# Patient Record
Sex: Male | Born: 1935 | Race: White | Hispanic: No | Marital: Married | Smoking: Never smoker
Health system: Southern US, Community
[De-identification: ages and names within clinical notes are randomized; demographics above are authoritative.]

## PROBLEM LIST (undated history)

## (undated) DIAGNOSIS — K635 Polyp of colon: Secondary | ICD-10-CM

## (undated) DIAGNOSIS — J45909 Unspecified asthma, uncomplicated: Secondary | ICD-10-CM

## (undated) DIAGNOSIS — N189 Chronic kidney disease, unspecified: Secondary | ICD-10-CM

## (undated) DIAGNOSIS — F419 Anxiety disorder, unspecified: Secondary | ICD-10-CM

## (undated) DIAGNOSIS — K219 Gastro-esophageal reflux disease without esophagitis: Secondary | ICD-10-CM

## (undated) DIAGNOSIS — C801 Malignant (primary) neoplasm, unspecified: Secondary | ICD-10-CM

## (undated) DIAGNOSIS — G20A1 Parkinson's disease without dyskinesia, without mention of fluctuations: Secondary | ICD-10-CM

## (undated) DIAGNOSIS — M1990 Unspecified osteoarthritis, unspecified site: Secondary | ICD-10-CM

## (undated) DIAGNOSIS — G20 Parkinson's disease: Secondary | ICD-10-CM

## (undated) DIAGNOSIS — Z87442 Personal history of urinary calculi: Secondary | ICD-10-CM

## (undated) HISTORY — PX: SMALL INTESTINE SURGERY: SHX150

## (undated) HISTORY — PX: EYE SURGERY: SHX253

## (undated) HISTORY — PX: HERNIA REPAIR: SHX51

## (undated) HISTORY — DX: Malignant (primary) neoplasm, unspecified: C80.1

## (undated) HISTORY — DX: Personal history of urinary calculi: Z87.442

## (undated) HISTORY — DX: Polyp of colon: K63.5

## (undated) HISTORY — PX: MOHS SURGERY: SHX181

## (undated) HISTORY — PX: COLON SURGERY: SHX602

---

## 2016-06-14 ENCOUNTER — Encounter: Payer: Self-pay | Admitting: Physician Assistant

## 2016-06-14 ENCOUNTER — Ambulatory Visit (INDEPENDENT_AMBULATORY_CARE_PROVIDER_SITE_OTHER): Payer: Medicare Other | Admitting: Physician Assistant

## 2016-06-14 ENCOUNTER — Telehealth: Payer: Self-pay

## 2016-06-14 VITALS — BP 130/70 | HR 69 | Temp 97.9°F | Resp 16 | Wt 174.8 lb

## 2016-06-14 DIAGNOSIS — R32 Unspecified urinary incontinence: Secondary | ICD-10-CM | POA: Diagnosis not present

## 2016-06-14 DIAGNOSIS — L989 Disorder of the skin and subcutaneous tissue, unspecified: Secondary | ICD-10-CM | POA: Diagnosis not present

## 2016-06-14 DIAGNOSIS — N400 Enlarged prostate without lower urinary tract symptoms: Secondary | ICD-10-CM | POA: Diagnosis not present

## 2016-06-14 DIAGNOSIS — R12 Heartburn: Secondary | ICD-10-CM | POA: Insufficient documentation

## 2016-06-14 DIAGNOSIS — Z7189 Other specified counseling: Secondary | ICD-10-CM

## 2016-06-14 DIAGNOSIS — F32A Depression, unspecified: Secondary | ICD-10-CM

## 2016-06-14 DIAGNOSIS — F0390 Unspecified dementia without behavioral disturbance: Secondary | ICD-10-CM

## 2016-06-14 DIAGNOSIS — R600 Localized edema: Secondary | ICD-10-CM | POA: Diagnosis not present

## 2016-06-14 DIAGNOSIS — Z7689 Persons encountering health services in other specified circumstances: Secondary | ICD-10-CM

## 2016-06-14 DIAGNOSIS — M1990 Unspecified osteoarthritis, unspecified site: Secondary | ICD-10-CM | POA: Insufficient documentation

## 2016-06-14 DIAGNOSIS — F329 Major depressive disorder, single episode, unspecified: Secondary | ICD-10-CM

## 2016-06-14 DIAGNOSIS — G20 Parkinson's disease: Secondary | ICD-10-CM | POA: Diagnosis not present

## 2016-06-14 DIAGNOSIS — G20A1 Parkinson's disease without dyskinesia, without mention of fluctuations: Secondary | ICD-10-CM | POA: Insufficient documentation

## 2016-06-14 MED ORDER — FINASTERIDE 5 MG PO TABS: 5.0000 mg | ORAL_TABLET | Freq: Every day | ORAL | 3 refills | Status: DC

## 2016-06-14 MED ORDER — SERTRALINE HCL 50 MG PO TABS: 50.0000 mg | ORAL_TABLET | Freq: Every day | ORAL | 3 refills | Status: DC

## 2016-06-14 MED ORDER — FUROSEMIDE 20 MG PO TABS: 20.0000 mg | ORAL_TABLET | Freq: Every day | ORAL | 3 refills | Status: DC

## 2016-06-14 MED ORDER — CARBIDOPA-LEVODOPA 25-100 MG PO TABS: ORAL_TABLET | ORAL | 11 refills | Status: DC

## 2016-06-14 MED ORDER — DONEPEZIL HCL 10 MG PO TBDP: 10.0000 mg | ORAL_TABLET | Freq: Every day | ORAL | 3 refills | Status: DC

## 2016-06-14 MED ORDER — TAMSULOSIN HCL 0.4 MG PO CAPS: 0.4000 mg | ORAL_CAPSULE | Freq: Every day | ORAL | 3 refills | Status: DC

## 2016-06-14 MED ORDER — AMANTADINE HCL 100 MG PO CAPS: 100.0000 mg | ORAL_CAPSULE | Freq: Three times a day (TID) | ORAL | 3 refills | Status: DC

## 2016-06-14 NOTE — Telephone Encounter (Signed)
Mr. And Mrs. Lupe where here today to get established.  Mrs. Mashni forgot to get a Handicapped placard form for Mr. Solan.  She would like to pick one up so she can bring it to the [facility].  Please contact her when complete at [phone].   Thanks,   -Mickel Baas

## 2016-06-14 NOTE — Progress Notes (Signed)
Patient: Luke Durham, Male    DOB: 01/05/1933, 82 y.o.   MRN: [id number] Visit Date: 06/14/2016  Today's Provider: Mar Daring, PA-C   Chief Complaint  Patient presents with  . New Patient (Initial Visit)  . Establish Care   Subjective:    Establish Care: Luke Durham is a 82 y.o. male who is here as a new patient to establish care. He feels well. He reports he is sleeping well. They moved from [place], [place] to be closer to their daughter that lives here and works at [employer].  He is overall fairly healthy with the exception of Parkinson's. He has had Parkinson's for a long period of time and states that of recent it has been progressing. He does state that his daughter is already working on getting him established with a movement specialist neurologist, Dr. Werner Lean, [facility]. He is currently on amantadine 100mg  TID, sinemet IR 25-100mg  2 tabs QID, donepezil 10mg  at bedtime, and a newer medication Nuplazid 17 mg tab.  He also has BPH for which he takes flomax 0.4mg  and then uses proscar 5mg  for urinary incontinence. He is also on furosemide 20mg  daily for lower extremity edema. Most often occurs when he sits with his legs in a dependent position.  He also uses sertraline 50mg  daily for mood stabilization. He reports he is stable on all his medications and has no complaints today.   He does take an ASA 81mg  daily. He has a stool softener that he uses prn. He takes Vitamin D3 daily and a multi-vitamin.   He was previously seen by Dr. Ricki Miller in [place], [place].  -----------------------------------------------------------------   Review of Systems  Constitutional: Positive for activity change and fatigue.  HENT: Positive for drooling, ear discharge, postnasal drip, rhinorrhea and trouble swallowing (sometimes).   Eyes: Negative.        Dry eyes  Respiratory: Negative.   Cardiovascular: Positive for leg swelling ([place]).  Gastrointestinal:  Positive for constipation.  Endocrine: Positive for polyuria.  Genitourinary: Positive for enuresis and urgency.  Musculoskeletal: Positive for gait problem. Negative for arthralgias, joint swelling and myalgias.  Skin: Positive for color change and rash.  Allergic/Immunologic: Negative.   Neurological: Positive for speech difficulty and weakness. Negative for dizziness, light-headedness and headaches.  Hematological: Negative.   Psychiatric/Behavioral: Positive for confusion, decreased concentration and hallucinations ("Nuplazid controls it"). The patient is nervous/anxious.     Social History      He  reports that he has never smoked. He has never used smokeless tobacco. He reports that he drinks alcohol. He reports that he does not use drugs.       Social History   Social History  . Marital status: Married    Spouse name: N/A  . Number of children: N/A  . Years of education: N/A   Social History Main Topics  . Smoking status: Never Smoker  . Smokeless tobacco: Never Used  . Alcohol use Yes     Comment: occasional  . Drug use: No  . Sexual activity: Not Asked   Other Topics Concern  . None   Social History Narrative  . None    Past Medical History:  Diagnosis Date  . Cancer ([place])   . Colon polyps   . History of kidney stones      Patient Active Problem List   Diagnosis Date Noted  . Parkinson's syndrome ([place]) 06/14/2016  . Arthritis 06/14/2016  . Depression 06/14/2016  . Heartburn 06/14/2016  .  Urinary incontinence 06/14/2016    Past Surgical History:  Procedure Laterality Date  . SMALL INTESTINE SURGERY     Per patient 7 years    Family History        No family status information on file.        His family history is not on file.    No Known Allergies  Current Meds  Medication Sig  . amantadine (SYMMETREL) 100 MG capsule Take 100 mg by mouth 2 (two) times daily.  Marland Kitchen aspirin 81 MG tablet Take 81 mg by mouth daily.  . carbidopa-levodopa (SINEMET  IR) 25-100 MG tablet take 2 tablets by mouth four times a day as directed  . Cholecalciferol (VITAMIN D3 PO) Take by mouth.  . docusate sodium (STOOL SOFTENER) 100 MG capsule Take 100 mg by mouth 2 (two) times daily.  . finasteride (PROSCAR) 5 MG tablet Take 5 mg by mouth daily.  . furosemide (LASIX) 20 MG tablet Take 20 mg by mouth.  . Multiple Vitamins-Minerals (OCUVITE PO) Take by mouth.  Marland Kitchen Pimavanserin Tartrate (NUPLAZID) 17 MG TABS Take 1 tablet by mouth daily.  . sertraline (ZOLOFT) 50 MG tablet Take 50 mg by mouth daily.  . tamsulosin (FLOMAX) 0.4 MG CAPS capsule Take 0.4 mg by mouth.    Patient Care Team: Mar Daring, PA-C as PCP - General (Family Medicine)     Objective:   Vitals: BP 130/70 (BP Location: Right Arm, Patient Position: Sitting, Cuff Size: Normal)   Pulse 69   Temp 97.9 F (36.6 C) (Oral)   Resp 16   Wt 174 lb 12.8 oz (79.3 kg)    Physical Exam  Constitutional: He appears well-developed and well-nourished. No distress.  HENT:  Head: Normocephalic and atraumatic.  Neck: Normal range of motion. Neck supple. No JVD present. No tracheal deviation present. No thyromegaly present.  Cardiovascular: Normal rate, regular rhythm and normal heart sounds.  Exam reveals no gallop and no friction rub.   No murmur heard. Pulmonary/Chest: Effort normal and breath sounds normal. No respiratory distress. He has no wheezes. He has no rales.  Musculoskeletal: He exhibits edema (trace today).  Lymphadenopathy:    He has no cervical adenopathy.  Neurological: He is alert. Gait abnormal.  Shuffling gait and slow start after sitting long periods  Skin: He is not diaphoretic.  Psychiatric: He has a normal mood and affect. His behavior is normal. Judgment and thought content normal.  Vitals reviewed.  Depression Screen No flowsheet data found.  Assessment & Plan:     Routine Health Maintenance and Physical Exam  Exercise Activities and Dietary  recommendations Goals    None       There is no immunization history on file for this patient.  Health Maintenance  Topic Date Due  . TETANUS/TDAP  03/24/1954  . ZOSTAVAX  03/25/1995  . PNA vac Low Risk Adult (1 of 2 - PCV13) 03/24/2000  . INFLUENZA VACCINE  05/24/2016      Discussed health benefits of physical activity, and encouraged him to engage in regular exercise appropriate for his age and condition.   1. Establishing care with new doctor, encounter for Records have been requested from Dr. Lorin Glass. I will await records. Will most likely see him back in 3 months. He is to call if he develops acute issues, questions or concerns in the meantime.  2. Parkinson's disease ([place]) Referral made to PT for movement and weakness associated with Parkinson's. Medications refilled as below. His daughter  is working on getting him established with Dr. Werner Lean, [facility]. Advised for them to call if they need assistance with this referral. - Ambulatory referral to Physical Therapy - amantadine (SYMMETREL) 100 MG capsule; Take 1 capsule (100 mg total) by mouth 3 (three) times daily.  Dispense: 270 capsule; Refill: 3 - carbidopa-levodopa (SINEMET IR) 25-100 MG tablet; take 2 tablets by mouth four times a day as directed  Dispense: 270 tablet; Refill: 11 - donepezil (ARICEPT ODT) 10 MG disintegrating tablet; Take 1 tablet (10 mg total) by mouth at bedtime.  Dispense: 90 tablet; Refill: 3  3. Urinary incontinence, unspecified incontinence type Stable. Diagnosis pulled for medication refill. Continue current medical treatment plan. - finasteride (PROSCAR) 5 MG tablet; Take 1 tablet (5 mg total) by mouth daily.  Dispense: 90 tablet; Refill: 3  4. Bilateral edema of lower extremity Stable. Diagnosis pulled for medication refill. Continue current medical treatment plan. - furosemide (LASIX) 20 MG tablet; Take 1 tablet (20 mg total) by mouth daily.  Dispense: 90 tablet; Refill: 3  5.  Depression Stable. Diagnosis pulled for medication refill. Continue current medical treatment plan. - sertraline (ZOLOFT) 50 MG tablet; Take 1 tablet (50 mg total) by mouth daily.  Dispense: 90 tablet; Refill: 3  6. Dementia, without behavioral disturbance Stable. Diagnosis pulled for medication refill. Continue current medical treatment plan. - donepezil (ARICEPT ODT) 10 MG disintegrating tablet; Take 1 tablet (10 mg total) by mouth at bedtime.  Dispense: 90 tablet; Refill: 3  7. Skin lesion Has had a few Mohs procedures on face in [place]. Has some lesions along scalp. Will refer to [facility], Dr. Evorn Gong or Dr. Kellie Moor. - Ambulatory referral to Dermatology  8. BPH (benign prostatic hyperplasia) Stable. Diagnosis pulled for medication refill. Continue current medical treatment plan. - tamsulosin (FLOMAX) 0.4 MG CAPS capsule; Take 1 capsule (0.4 mg total) by mouth daily.  Dispense: 90 capsule; Refill: 3   --------------------------------------------------------------------    Mar Daring, PA-C  [facility]

## 2016-06-15 NOTE — Telephone Encounter (Signed)
Form completed and will be up front for pick up at their convenience.

## 2016-07-19 ENCOUNTER — Ambulatory Visit: Payer: Medicare Other | Attending: Physician Assistant | Admitting: Physical Therapy

## 2016-07-19 ENCOUNTER — Encounter: Payer: Self-pay | Admitting: Physical Therapy

## 2016-07-19 DIAGNOSIS — R2681 Unsteadiness on feet: Secondary | ICD-10-CM | POA: Diagnosis not present

## 2016-07-19 DIAGNOSIS — M6281 Muscle weakness (generalized): Secondary | ICD-10-CM | POA: Insufficient documentation

## 2016-07-19 DIAGNOSIS — R293 Abnormal posture: Secondary | ICD-10-CM | POA: Insufficient documentation

## 2016-07-19 NOTE — Therapy (Signed)
[place] MAIN [facility] SERVICES [address] [place], [place], [address] Phone: [phone]   Fax:  [phone]  Physical Therapy Evaluation  Patient Details  Name: Luke Durham MRN: [id number] Date of Birth: 1933/03/28 Referring Provider: Marlyn Corporal  Encounter Date: 07/19/2016      PT End of Session - 07/19/16 1038    Visit Number 1   Number of Visits 8   Date for PT Re-Evaluation 09/13/16   Authorization Type G code 1   Authorization Time Period 10   PT Start Time 0830   PT Stop Time 0929   PT Time Calculation (min) 59 min   Equipment Utilized During Treatment Gait belt   Activity Tolerance Patient tolerated treatment well   Behavior During Therapy [facility] for tasks assessed/performed      Past Medical History:  Diagnosis Date  . Cancer ([place]) 7 years ago   lymphoma, [place] (recent)  . Colon polyps   . History of kidney stones     Past Surgical History:  Procedure Laterality Date  . HERNIA REPAIR  20 years  . SMALL INTESTINE SURGERY     Per patient 7 years    There were no vitals filed for this visit.       Subjective Assessment - 07/19/16 0833    Subjective Pt is a 82 y.o. male who presents to PT with reports of stiffness due to his PD. His wife is present with him today and assists him with answering subjective questions. He states he was diagnosed with PD ~10 years ago.  Patient notes that he had been doing well for a while, but his PD has been getting progressively worse. He notes that his biggest complaints are his stiffness and balance deficits.  The patient denies recent falls; however, he states he's had 3 in the last 6 months that occur in his home when he is not using an AD. He uses a RW and U step walker when outside but will not use his AD in the home. He reports he had a Mohs surgery for BCC last week with a return visit the following week for a check up. His precautions for this surgery include no bending or lifting. Pt  reports no difficulty sleeping or eating.    Patient is accompained by: Family member   Pertinent History Factors affecting rehab: high co-pay, progressive disease, supportive family   Limitations Sitting;Standing;Walking   How long can you sit comfortably? 1 hour   How long can you stand comfortably? 20 min.   How long can you walk comfortably? level surface with RW, greater distance   Patient Stated Goals improve balance and flexibility   Currently in Pain? No/denies            [facility] PT Assessment - 07/19/16 0001      Assessment   Medical Diagnosis PD   Referring Provider [place]   Onset Date/Surgical Date --  20 years ago   Hand Dominance Right   Next MD Visit Does not know   Prior Therapy Has had previously for PD, reports it has helped when done in the past     Precautions   Precautions Fall;Other (comment)   Precaution Comments No bending or lifting due to Mohs surgery     Restrictions   Weight Bearing Restrictions No     Balance Screen   Has the patient fallen in the past 6 months Yes   How many times? 3   Has the patient  had a decrease in activity level because of a fear of falling?  Yes   Is the patient reluctant to leave their home because of a fear of falling?  Yes     [place] Private residence   Living Arrangements Spouse/significant other   Available Help at Discharge Family   Type of [place] Access Level entry   [place] bars - toilet;Grab bars - tub/shower;Shower seat - built in;Walker - 2 wheels   Additional Comments Home is handicap accessible, has 2nd story but wife states it is not completed/used     Prior Function   Level of Independence Needs assistance with homemaking;Needs assistance with ADLs   Vocation Retired   Biomedical scientist None   Leisure Marine scientist, tennis     Cognition   Overall Cognitive Status Within Functional Limits for tasks assessed  takes  longer to understand cues     Observation/Other Assessments   Observations Freezing gait with initiation of movement and turns     Sensation   Light Touch Appears Intact   Additional Comments Denies N/T     Coordination   Gross Motor Movements are Fluid and Coordinated No   Fine Motor Movements are Fluid and Coordinated No   Coordination and Movement Description UE/LE ramps are normal   Finger Nose Finger Test Postive B with mild past pointing   Heel Shin Test Difficulty understanding testing     Posture/Postural Control   Posture Comments Mild-moderate kyphosis with forward flexed posture that is not fully corrected with VCs for sitting technique; forward head posture     ROM / Strength   AROM / PROM / Strength AROM;Strength     AROM   Overall AROM Comments Cervical has a gross mild limitation in all ranges, B UE/LE indicate are [facility]     Strength   Overall Strength Comments Only mild pressure applied from SPT due to precautions from Moh's surgery   Right Shoulder Flexion 4/5   Right Shoulder Extension 4/5   Right Shoulder ABduction 4-/5   Left Shoulder Flexion 4/5   Left Shoulder Extension 4/5   Left Shoulder ABduction 4-/5   Right Elbow Flexion 4/5   Right Elbow Extension 4/5   Left Elbow Flexion 4/5   Left Elbow Extension 4/5   Right Hip Flexion 4-/5   Right Hip ABduction 3+/5  seated   Right Hip ADduction 3+/5  seated   Left Hip Flexion 4-/5   Left Hip ABduction 3+/5  seated   Left Hip ADduction 3+/5  seated   Right Knee Flexion 3+/5   Right Knee Extension 4/5   Left Knee Flexion 3+/5   Left Knee Extension 4/5   Right Ankle Dorsiflexion 4+/5   Left Ankle Dorsiflexion 4+/5     Transfers   Comments Requires BUE, performs very slowly, gets stuck in partial stand     Ambulation/Gait   Gait Comments Freezing gait with initiation of movement and turns able to overcome with SPT instruction to "take big step", shuffling gait pattern, forward head and forward trunk  flexion, decreased step length, difficulty turning RW well     Standardized Balance Assessment   Five times sit to stand comments  13s, BUE support, poor test due to pt's difficulty to come to full sitting/standing position in between reps, >60 <14 seconds indicates decreased risk for falls   10 Meter Walk 16s=0.625, RW and CGA, indicates falls  risk and limited community ambulator     Timed Up and Go Test   Normal TUG (seconds) 48.8   TUG Comments RW, CGA, >80 y.o. >12.7 s indicates patient is dependent and at risk for falls     High Level Balance   High Level Balance Comments Static stand feet apart eyes open x30 seconds, stable with poor posture; eyes closed with increase sway, still stable     Treatment: HEP initiated with seated PWR; handout given; wife and patient instructed in movements PWR Up: x10, x5, min VCs for opening posture and not to lean all the way forward to maintain surgical precautions PWR Rock: x10, x5, mod-max VCs and tactile cueing for increase LE movement and weight shifts PWR Twist: x10, x5, mod-max VCs and tactile cues to open in the middle and rotate as much as possible PWR Step: x10, x5, mod VCs and tactile cues for initiation of movement  Instructed to perform x10 reps in am and pm and to walk 1xday for ~5 min.                       PT Education - 07/19/16 1037    Education provided Yes   Education Details HEP initiated-seated PWR   Person(s) Educated Patient;Spouse   Methods Explanation;Demonstration;Tactile cues;Verbal cues;Handout   Comprehension Verbalized understanding;Returned demonstration;Verbal cues required;Tactile cues required             PT Long Term Goals - 07/19/16 1045      PT LONG TERM GOAL #1   Title Patient will be independent in HEP in order to increase patient's ability to maintain gains achieved in therapy and assist with return to PLOF.    Time 8   Period Weeks   Status New     PT LONG TERM GOAL #2    Title Patient will decrease TUG test to <20 seconds in order to achieve an independently mobile status and decrease his risk for falls   Time 8   Period Weeks   Status New     PT LONG TERM GOAL #3   Title Patient will increase overall strength to 4+/5 in both UE and LE in order to complete transfers safely with decreased risk for falls.    Time 8   Period Weeks   Status New     PT LONG TERM GOAL #4   Title Patient will increase his 10 m walk test time to >1.0 m/s in order to be a community ambulator at decreased risk for falls.    Time 8   Period Weeks   Status New     PT LONG TERM GOAL #5   Title --   Time --   Period --   Status --               Plan - 07/19/16 1039    Clinical Impression Statement Pt is a 82 y.o. male who presents to PT with deficits related to diagnosis of PD. The pt has poor postural mobility with a forward head and trunk posturing. He requires multiple VCs to maintain upright posture but is unable to achieve full upright. He demonstrates mild general weakness B UE/LE but otherwise has good strength. He has poor transfers with difficulty moving from sit to stand from a lower surface and gait freezing with turns and initiation of movement. With instruction patient is able to overcome freezing patterns. Pt is at falls risk based on 10 meter walk and  TUG outcome measures. Recommended to pt and wife to see them 2xweek; however with high co-pay they preferred 1xweek. The pt would benefit from skilled PT in order to address general weakness, safety with mobility, and posture.    Rehab Potential Fair   Clinical Impairments Affecting Rehab Potential Positive factors: motivated, family support, previous good responses to PT; Negative factors: high co-pay, progressive disease; Clinical Presentation: Evolving-multiple falls   PT Frequency 1x / week   PT Duration 8 weeks   PT Treatment/Interventions ADLs/Self Care Home Management;Aquatic Therapy;Electrical  Stimulation;Moist Heat;DME Instruction;Gait training;Stair training;Functional mobility training;Therapeutic activities;Therapeutic exercise;Balance training;Neuromuscular re-education;Patient/family education;Manual techniques;Energy conservation   PT Next Visit Plan continue to progress PWR program   PT Home Exercise Plan HEP initiated-seated PWR program   Consulted and Agree with Plan of Care Patient;Family member/caregiver      Patient will benefit from skilled therapeutic intervention in order to improve the following deficits and impairments:  Abnormal gait, Decreased activity tolerance, Decreased balance, Decreased coordination, Decreased endurance, Decreased mobility, Decreased range of motion, Decreased safety awareness, Decreased strength, Hypomobility, Impaired flexibility, Improper body mechanics, Postural dysfunction  Visit Diagnosis: Unsteadiness on feet - Plan: PT plan of care cert/re-cert  Abnormal posture - Plan: PT plan of care cert/re-cert  Muscle weakness (generalized) - Plan: PT plan of care cert/re-cert      G-Codes - [id number]    Functional Assessment Tool Used timed up and go, 10 meter walk, transfers, clinical judgement   Functional Limitation Mobility: Walking and moving around   Mobility: Walking and Moving Around Current Status [id number]) At least 40 percent but less than 60 percent impaired, limited or restricted   Mobility: Walking and Moving Around Goal Status [phone]) At least 20 percent but less than 40 percent impaired, limited or restricted       Problem List Patient Active Problem List   Diagnosis Date Noted  . Parkinson's syndrome ([place]) 06/14/2016  . Arthritis 06/14/2016  . Depression 06/14/2016  . Heartburn 06/14/2016  . Urinary incontinence 06/14/2016  . Skin lesion 06/14/2016  . Dementia 06/14/2016  . BPH (benign prostatic hyperplasia) 06/14/2016  . Bilateral edema of lower extremity 06/14/2016   Tilman Neat, SPT This entire session  was performed under direct supervision and direction of a licensed therapist/therapist assistant . I have personally read, edited and approve of the note as written.  Trotter,Margaret PT, DPT 07/19/2016, 1:19 PM  [place] MAIN [facility] SERVICES [address] [place], [place], [address] Phone: [phone]   Fax:  [phone]  Name: Luke Durham MRN: [id number] Date of Birth: 1933/07/03

## 2016-07-21 ENCOUNTER — Ambulatory Visit: Payer: Medicare Other | Admitting: Physical Therapy

## 2016-07-25 ENCOUNTER — Ambulatory Visit: Payer: Medicare Other | Admitting: Physical Therapy

## 2016-07-27 ENCOUNTER — Ambulatory Visit: Payer: Medicare Other | Admitting: Physical Therapy

## 2016-07-29 ENCOUNTER — Encounter: Payer: Self-pay | Admitting: Physician Assistant

## 2016-08-01 ENCOUNTER — Ambulatory Visit: Payer: Medicare Other | Attending: Physician Assistant | Admitting: Physical Therapy

## 2016-08-01 ENCOUNTER — Encounter: Payer: Self-pay | Admitting: Physical Therapy

## 2016-08-01 DIAGNOSIS — R293 Abnormal posture: Secondary | ICD-10-CM | POA: Diagnosis present

## 2016-08-01 DIAGNOSIS — R2681 Unsteadiness on feet: Secondary | ICD-10-CM | POA: Diagnosis present

## 2016-08-01 DIAGNOSIS — M6281 Muscle weakness (generalized): Secondary | ICD-10-CM | POA: Diagnosis present

## 2016-08-01 NOTE — Therapy (Signed)
[place] MAIN [facility] SERVICES [address] [place], [place], [address] Phone: [phone]   Fax:  [phone]  Physical Therapy Treatment  Patient Details  Name: Luke Durham MRN: [id number] Date of Birth: Jul 25, 1933 Referring Provider: Marlyn Corporal  Encounter Date: 08/01/2016      PT End of Session - 08/01/16 1038    Visit Number 2   Number of Visits 8   Date for PT Re-Evaluation 09/13/16   Authorization Type G code 2   Authorization Time Period 10   PT Start Time 0845   PT Stop Time 0930   PT Time Calculation (min) 45 min   Equipment Utilized During Treatment Gait belt   Activity Tolerance Patient tolerated treatment well   Behavior During Therapy [facility] for tasks assessed/performed      Past Medical History:  Diagnosis Date  . Cancer ([place]) 7 years ago   lymphoma, [place] (recent)  . Colon polyps   . History of kidney stones     Past Surgical History:  Procedure Laterality Date  . HERNIA REPAIR  20 years  . SMALL INTESTINE SURGERY     Per patient 7 years    There were no vitals filed for this visit.      Subjective Assessment - 08/01/16 0852    Subjective Pt reports he no longer has the bending/lifting precautions.  Pt reports that he did some of the exercises that were given but does have a few questiosn regarding them.     Patient is accompained by: Family member   Pertinent History Factors affecting rehab: high co-pay, progressive disease, supportive family   Limitations Sitting;Standing;Walking   How long can you sit comfortably? 1 hour   How long can you stand comfortably? 20 min.   How long can you walk comfortably? level surface with RW, greater distance   Patient Stated Goals improve balance and flexibility        Treatment Nustep x 4 mins BLES, level 1 (unbilled)  Prior to exercise: Transfer training x 6 repetitions, pt required demonstration on proper transfer training and safety with RW.  Pt was given min  VCs to stay inside walker, take larger steps and turn with RW until legs are against chair transferring to.  Pt then instructed to reach back for arm rest with one hand before sitting.  Pt required CGA for safety and min VCs to ensure legs were against back of chair.  Min VCs also provided for greater upright posture. (ther act 30 mins total)     Bed mobility x 2 repetitions from sidelying to supine, supine to sidelying to sitting, pt required 2 pillows to relax head down on pillow and min A to bring LEs into bed, pt required min VCs for positioining and lateral scooting to ensure proper positioning in supine.  With tactile cueing pt was able to roll into L sidelying and bring LEs off bed and required min A to push up into seated position  Following therapeutic activity Manual stretching in supine to BLEs hamstrings, dorsiflexors and hip flexors x 5 mins to decrease stiffness before performing therapeutic ex.  Pt reported decreased stiffness in BLEs after stretching  Seated Power Exercises (min VCs to increase vocalization and count out repetitions)   -Power Up x 10 reps, with min VCs to increase shoulder extension and keep palms facing forward  -Power Rock x 10 reps, min VCs to increase reach towards ceiling and hip abduction  -Power twist x 10 reps, min  tactile cues to "open" between each repetition, min VCs for increased trunk rotation  -Power step x 10 reps, min VCs to increase stomp and increase hip abduction                          PT Education - 08/01/16 1037    Education provided Yes   Education Details bed mobility, transfer training, seated power    Person(s) Educated Patient;Spouse   Methods Explanation;Demonstration;Verbal cues;Tactile cues   Comprehension Verbalized understanding;Verbal cues required;Returned demonstration             PT Long Term Goals - 07/19/16 1045      PT LONG TERM GOAL #1   Title Patient will be independent in HEP in order to  increase patient's ability to maintain gains achieved in therapy and assist with return to PLOF.    Time 8   Period Weeks   Status New     PT LONG TERM GOAL #2   Title Patient will decrease TUG test to <20 seconds in order to achieve an independently mobile status and decrease his risk for falls   Time 8   Period Weeks   Status New     PT LONG TERM GOAL #3   Title Patient will increase overall strength to 4+/5 in both UE and LE in order to complete transfers safely with decreased risk for falls.    Time 8   Period Weeks   Status New     PT LONG TERM GOAL #4   Title Patient will increase his 10 m walk test time to >1.0 m/s in order to be a community ambulator at decreased risk for falls.    Time 8   Period Weeks   Status New     PT LONG TERM GOAL #5   Title --   Time --   Period --   Status --               Plan - 08/01/16 1038    Clinical Impression Statement Pt reports back to therapy with his wife today.  His restrictions on lifting and bending have been lifted.  Initated sit to stand stand and transfer training for safety with RW.  Pt required min VCs for to use walker appropriately,  wait to sit until legs were supported by chair and to reach back for chair.  With training pt demonstrated progress.  Bed mobility was assessed due to reports of difficulty.  With tactile cues to reach across midline and bring LEs off bed pt was able to come from supine to sit more independently.  Seated power was reviewed for HEP, pt continues to require min VCs for "open" positioning and increased trunk rotation.  He would continue to  benefit from further skilled PT to increase bed mobility, transfers and gait for more functional mobility.     Rehab Potential Fair   Clinical Impairments Affecting Rehab Potential Positive factors: motivated, family support, previous good responses to PT; Negative factors: high co-pay, progressive disease; Clinical Presentation: Evolving-multiple falls   PT  Frequency 1x / week   PT Duration 8 weeks   PT Treatment/Interventions ADLs/Self Care Home Management;Aquatic Therapy;Electrical Stimulation;Moist Heat;DME Instruction;Gait training;Stair training;Functional mobility training;Therapeutic activities;Therapeutic exercise;Balance training;Neuromuscular re-education;Patient/family education;Manual techniques;Energy conservation   PT Next Visit Plan continue to progress PWR program   PT Home Exercise Plan HEP initiated-seated PWR program   Consulted and Agree with Plan of Care Patient;Family member/caregiver  Patient will benefit from skilled therapeutic intervention in order to improve the following deficits and impairments:  Abnormal gait, Decreased activity tolerance, Decreased balance, Decreased coordination, Decreased endurance, Decreased mobility, Decreased range of motion, Decreased safety awareness, Decreased strength, Hypomobility, Impaired flexibility, Improper body mechanics, Postural dysfunction  Visit Diagnosis: Unsteadiness on feet  Abnormal posture  Muscle weakness (generalized)     Problem List Patient Active Problem List   Diagnosis Date Noted  . Parkinson's syndrome ([place]) 06/14/2016  . Arthritis 06/14/2016  . Depression 06/14/2016  . Heartburn 06/14/2016  . Urinary incontinence 06/14/2016  . Skin lesion 06/14/2016  . Dementia 06/14/2016  . BPH (benign prostatic hyperplasia) 06/14/2016  . Bilateral edema of lower extremity 06/14/2016   Stacy Gardner, SPT  This entire session was performed under direct supervision and direction of a licensed therapist/therapist assistant . I have personally read, edited and approve of the note as written.  Trotter,Margaret PT, DPT 08/01/2016, 1:33 PM  [place] MAIN [facility] SERVICES [address] [place], [place], [address] Phone: [phone]   Fax:  [phone]  Name: Luke Durham MRN: [id number] Date of Birth: [birth year]

## 2016-08-03 ENCOUNTER — Ambulatory Visit: Payer: Medicare Other | Admitting: Physical Therapy

## 2016-08-08 ENCOUNTER — Ambulatory Visit: Payer: Medicare Other | Admitting: Physical Therapy

## 2016-08-08 ENCOUNTER — Encounter: Payer: Self-pay | Admitting: Physical Therapy

## 2016-08-08 DIAGNOSIS — R2681 Unsteadiness on feet: Secondary | ICD-10-CM

## 2016-08-08 DIAGNOSIS — R293 Abnormal posture: Secondary | ICD-10-CM

## 2016-08-08 DIAGNOSIS — M6281 Muscle weakness (generalized): Secondary | ICD-10-CM

## 2016-08-08 NOTE — Therapy (Signed)
[place] MAIN [facility] SERVICES [address] [place], [place], [address] Phone: [phone]   Fax:  [phone]  Physical Therapy Treatment  Patient Details  Name: Luke Durham MRN: [id number] Date of Birth: 09/12/1933 Referring Provider: Marlyn Corporal  Encounter Date: 08/08/2016      PT End of Session - 08/08/16 1300    Visit Number 3   Number of Visits 8   Date for PT Re-Evaluation 09/13/16   Authorization Type G code 3   Authorization Time Period 10   PT Start Time 1030   PT Stop Time 1117   PT Time Calculation (min) 47 min   Equipment Utilized During Treatment Gait belt   Activity Tolerance Patient tolerated treatment well   Behavior During Therapy [facility] for tasks assessed/performed      Past Medical History:  Diagnosis Date  . Cancer ([place]) 7 years ago   lymphoma, [place] (recent)  . Colon polyps   . History of kidney stones     Past Surgical History:  Procedure Laterality Date  . HERNIA REPAIR  20 years  . SMALL INTESTINE SURGERY     Per patient 7 years    There were no vitals filed for this visit.      Subjective Assessment - 08/08/16 1028    Subjective Patient notes he had 3 falls on Saturday. He states he was using the walker, but his wife states he wasn't using it well. He states he has a few questions about getting out of bed.   Patient is accompained by: Family member   Pertinent History Factors affecting rehab: high co-pay, progressive disease, supportive family   Limitations Sitting;Standing;Walking   How long can you sit comfortably? 1 hour   How long can you stand comfortably? 20 min.   How long can you walk comfortably? level surface with RW, greater distance   Patient Stated Goals improve balance and flexibility   Currently in Pain? No/denies     Treatment:  Previous to TherEx; NuStep, L1 x4 min, BUE/LE; unbilled Transfer training to<>from 2 chairs positioned opposite of each other, mod-max VCs and assist  to position RW and move around obstacles and not into them; min VCs to turn RW before feet and to stay inside walker, Sit<>stands with transfer training; x4; max VCs for body positioning and feeling of chair behind legs before sitting and to reach back to chair.   Bed mobility x2; patient requires max VCs for positioning and sequencing of the activity; instructed patient to move onto elbow and bring BLE upwards while lying down; requires min-modA with BLE to move onto bed; Instructed on log roll technique Requires min-mod VCs for foot placement and bridging in order to move body into a more linear position on mat table.  After bed mobility; patient had a scab that was opened. It was cleaned and had no further issues.   After TherAct: Manual Stretching in supine to BLEs, x5 for 15 seconds each stretch     D1 pattern (hip flexion and ER)      Hamstring stretch Increased tightness on LLE compared to RLE.  Seated PWR; requires min-mod VCs for body positioning, requires mod-max tactile cueing for PWR twist, [employer], and PWR Up initially.  PWR UP: 2x10, instructed to increase opening phase PWR Rock: 2x10, instructed to reach further towards the ceiling and move LEs into ext PWR Twist: 2x10, instructed to "open" in between turning and to move as far as possible PWR Step: 2x10,  instructed to step with increase hip flexion.                              PT Education - 08/08/16 1259    Education provided Yes   Education Details bed mobility, transfer training, and seated power   Person(s) Educated Patient;Spouse   Methods Explanation;Demonstration   Comprehension Verbalized understanding;Returned demonstration             PT Long Term Goals - 07/19/16 1045      PT LONG TERM GOAL #1   Title Patient will be independent in HEP in order to increase patient's ability to maintain gains achieved in therapy and assist with return to PLOF.    Time 8   Period Weeks   Status  New     PT LONG TERM GOAL #2   Title Patient will decrease TUG test to <20 seconds in order to achieve an independently mobile status and decrease his risk for falls   Time 8   Period Weeks   Status New     PT LONG TERM GOAL #3   Title Patient will increase overall strength to 4+/5 in both UE and LE in order to complete transfers safely with decreased risk for falls.    Time 8   Period Weeks   Status New     PT LONG TERM GOAL #4   Title Patient will increase his 10 m walk test time to >1.0 m/s in order to be a community ambulator at decreased risk for falls.    Time 8   Period Weeks   Status New     PT LONG TERM GOAL #5   Title --   Time --   Period --   Status --               Plan - 08/08/16 1300    Clinical Impression Statement Pt presents to PT with his wife. He reports having 3 falls over the weekend on Saturday only. He and his wife denies injuries or head issues. The patient continues to demonstrate poor safety with RW and requires mod-max assist and VCs for RW placement during transfer training, bed mobility, and seated PWR exercises. The patient is unsafe and requires CGA constantly for safety. Patient and wife instructed to perform exercises consistently due to the fact that no gains or very little gains will be made otherwise. Pt would benefit from skilled PT in order to address general muscle weakness, address posture, and improve safety with mobility.   Rehab Potential Fair   Clinical Impairments Affecting Rehab Potential Positive factors: motivated, family support, previous good responses to PT; Negative factors: high co-pay, progressive disease; Clinical Presentation: Evolving-multiple falls   PT Frequency 1x / week   PT Duration 8 weeks   PT Treatment/Interventions ADLs/Self Care Home Management;Aquatic Therapy;Electrical Stimulation;Moist Heat;DME Instruction;Gait training;Stair training;Functional mobility training;Therapeutic activities;Therapeutic  exercise;Balance training;Neuromuscular re-education;Patient/family education;Manual techniques;Energy conservation   PT Next Visit Plan continue to progress PWR program   PT Home Exercise Plan HEP initiated-seated PWR program   Consulted and Agree with Plan of Care Patient;Family member/caregiver      Patient will benefit from skilled therapeutic intervention in order to improve the following deficits and impairments:  Abnormal gait, Decreased activity tolerance, Decreased balance, Decreased coordination, Decreased endurance, Decreased mobility, Decreased range of motion, Decreased safety awareness, Decreased strength, Hypomobility, Impaired flexibility, Improper body mechanics, Postural dysfunction  Visit Diagnosis: Unsteadiness on  feet  Abnormal posture  Muscle weakness (generalized)     Problem List Patient Active Problem List   Diagnosis Date Noted  . Parkinson's syndrome ([place]) 06/14/2016  . Arthritis 06/14/2016  . Depression 06/14/2016  . Heartburn 06/14/2016  . Urinary incontinence 06/14/2016  . Skin lesion 06/14/2016  . Dementia 06/14/2016  . BPH (benign prostatic hyperplasia) 06/14/2016  . Bilateral edema of lower extremity 06/14/2016   Luke Durham, SPT  This entire session was performed under direct supervision and direction of a licensed therapist/therapist assistant . I have personally read, edited and approve of the note as written.  Collie Siad PT, DPT 08/08/2016, 2:36 PM  [place] MAIN [facility] SERVICES [address] [place], [place], [address] Phone: [phone]   Fax:  [phone]  Name: Luke Durham MRN: [id number] Date of Birth: 1933-02-18

## 2016-08-10 ENCOUNTER — Ambulatory Visit: Payer: Medicare Other | Admitting: Physical Therapy

## 2016-08-15 ENCOUNTER — Ambulatory Visit: Payer: Medicare Other | Admitting: Physical Therapy

## 2016-08-15 DIAGNOSIS — R2681 Unsteadiness on feet: Secondary | ICD-10-CM

## 2016-08-15 DIAGNOSIS — R293 Abnormal posture: Secondary | ICD-10-CM

## 2016-08-15 DIAGNOSIS — M6281 Muscle weakness (generalized): Secondary | ICD-10-CM

## 2016-08-15 NOTE — Therapy (Signed)
[place] MAIN [facility] SERVICES [address] [place], [place], [address] Phone: [phone]   Fax:  [phone]  Physical Therapy Treatment  Patient Details  Name: Luke Durham MRN: [id number] Date of Birth: [birth year] Referring Provider: Marlyn Corporal  Encounter Date: 08/15/2016      PT End of Session - 08/15/16 1153    Visit Number 4   Number of Visits 8   Date for PT Re-Evaluation 09/13/16   Authorization Type G code 4   Authorization Time Period 10   PT Start Time 1100   PT Stop Time 1148   PT Time Calculation (min) 48 min   Equipment Utilized During Treatment Gait belt   Activity Tolerance Patient tolerated treatment well   Behavior During Therapy [facility] for tasks assessed/performed      Past Medical History:  Diagnosis Date  . Cancer ([place]) 7 years ago   lymphoma, [place] (recent)  . Colon polyps   . History of kidney stones     Past Surgical History:  Procedure Laterality Date  . HERNIA REPAIR  20 years  . SMALL INTESTINE SURGERY     Per patient 7 years    There were no vitals filed for this visit.      Subjective Assessment - 08/15/16 1107    Subjective Patient denies any falls since the last physical therapy visit. His wife notes they are working transfers. They have been doing his exercises 3-4 times out of 6 days. His wife and he notes that he has walked ~20 min about once every other day.    Patient is accompained by: Family member   Pertinent History Factors affecting rehab: high co-pay, progressive disease, supportive family   Limitations Sitting;Standing;Walking   How long can you sit comfortably? 1 hour   How long can you stand comfortably? 20 min.   How long can you walk comfortably? level surface with RW, greater distance   Patient Stated Goals improve balance and flexibility   Currently in Pain? No/denies     Treatment: NuStep, L0 x4 min, BUE/LE  Previous to TherEx: Bed mobility x2; patient requires max  VCs for positioning and sequencing of the activity; instructed patient to move onto elbow and bring BLE upwards while lying down; requires minA with BLE and CGA/minA at upper extremity to understand movement to move onto bed; Instructed on log roll technique Requires min-mod VCs for foot placement and bridging in order to move body into a more linear position on mat table.  Sit to stands, x5 min VCs for body positioning initially, BUE support, CGA for safety  After TherAct: Manual Stretching in supine to BLEs, x6 for 15 seconds each stretch     D1 pattern (hip flexion and ER)  Continued increased tightness on LLE compared to RLE; improved from last session  Seated PWR; requires min-mod VCs for body positioning, requires mod-max tactile cueing for PWR twist, [employer], and PWR Up initially.  PWR UP: x10, instructed to increase opening phase PWR Rock: x10, instructed to reach further towards the ceiling and move into further weight shifts PWR Twist: 2x10, instructed to "open" in between turning and to move as far as possible, minA  PWR Step: 2x10, instructed to step with increase hip flexion  Supine PWR; requires min tactile cueing and minA with initial movement, mod VCs for continuation of good technique during exercises Positioned with one pillow under head for comfort PWR Up: 2x10, max VCs and min tactile cueing to perform chest  opening [place]: x2, painful d/c PWR Twist: x1, painful d/c PWR Step: 2x10, instructed to step with increase hip flexion                               PT Education - 08/15/16 1152    Education provided Yes   Education Details bed mobility, transfer training, and progressed to supine PWR posture and transition   Person(s) Educated Patient;Spouse   Methods Handout;Tactile cues;Verbal cues;Explanation   Comprehension Verbal cues required;Returned demonstration;Tactile cues required;Verbalized understanding             PT Long  Term Goals - 07/19/16 1045      PT LONG TERM GOAL #1   Title Patient will be independent in HEP in order to increase patient's ability to maintain gains achieved in therapy and assist with return to PLOF.    Time 8   Period Weeks   Status New     PT LONG TERM GOAL #2   Title Patient will decrease TUG test to <20 seconds in order to achieve an independently mobile status and decrease his risk for falls   Time 8   Period Weeks   Status New     PT LONG TERM GOAL #3   Title Patient will increase overall strength to 4+/5 in both UE and LE in order to complete transfers safely with decreased risk for falls.    Time 8   Period Weeks   Status New     PT LONG TERM GOAL #4   Title Patient will increase his 10 m walk test time to >1.0 m/s in order to be a community ambulator at decreased risk for falls.    Time 8   Period Weeks   Status New     PT LONG TERM GOAL #5   Title --   Time --   Period --   Status --               Plan - 08/15/16 1153    Clinical Impression Statement Pt presents to PT with wife. He denies falls today and states that they have been practicing how to transfer with his walker. The patient demonstrates fair carryover with improved pre-trasfer set up for sitting<>standing and minA for hand placement. He continues to have difficulty with bed mobility continuing minA and requires multiple VCs that he is safe and will not roll off the mat table. The patient has improvement in seated PWR exercises although he continues to require min tactile cues and min-mod VCs. Supine PWR exercises of up and step initiated. PWR rock and transition were mildly painful to R shoulder so d/c. Patient would continue to benefit from skilled PT in order to improve safety with mobility, muscle weakness, and movement during activities.    Rehab Potential Fair   Clinical Impairments Affecting Rehab Potential Positive factors: motivated, family support, previous good responses to PT; Negative  factors: high co-pay, progressive disease; Clinical Presentation: Evolving-multiple falls   PT Frequency 1x / week   PT Duration 8 weeks   PT Treatment/Interventions ADLs/Self Care Home Management;Aquatic Therapy;Electrical Stimulation;Moist Heat;DME Instruction;Gait training;Stair training;Functional mobility training;Therapeutic activities;Therapeutic exercise;Balance training;Neuromuscular re-education;Patient/family education;Manual techniques;Energy conservation   PT Next Visit Plan continue to progress PWR program   PT Home Exercise Plan HEP progressed-seated PWR program supine PWR UP and Step   Consulted and Agree with Plan of Care Patient;Family member/caregiver      Patient  will benefit from skilled therapeutic intervention in order to improve the following deficits and impairments:  Abnormal gait, Decreased activity tolerance, Decreased balance, Decreased coordination, Decreased endurance, Decreased mobility, Decreased range of motion, Decreased safety awareness, Decreased strength, Hypomobility, Impaired flexibility, Improper body mechanics, Postural dysfunction  Visit Diagnosis: Unsteadiness on feet  Abnormal posture  Muscle weakness (generalized)     Problem List Patient Active Problem List   Diagnosis Date Noted  . Parkinson's syndrome ([place]) 06/14/2016  . Arthritis 06/14/2016  . Depression 06/14/2016  . Heartburn 06/14/2016  . Urinary incontinence 06/14/2016  . Skin lesion 06/14/2016  . Dementia 06/14/2016  . BPH (benign prostatic hyperplasia) 06/14/2016  . Bilateral edema of lower extremity 06/14/2016   Luke Durham, SPT This entire session was performed under direct supervision and direction of a licensed therapist/therapist assistant . I have personally read, edited and approve of the note as written.  Luke Durham,Luke Durham PT, DPT 08/16/2016, 9:46 AM  [place] MAIN [facility] SERVICES [address] [place], [place],  [address] Phone: [phone]   Fax:  [phone]  Name: Marnell Parrill MRN: [id number] Date of Birth: 01-28-1933

## 2016-08-17 ENCOUNTER — Ambulatory Visit: Payer: Medicare Other | Admitting: Physical Therapy

## 2016-08-22 ENCOUNTER — Ambulatory Visit: Payer: Medicare Other | Admitting: Physical Therapy

## 2016-08-24 ENCOUNTER — Ambulatory Visit: Payer: Medicare Other | Admitting: Physical Therapy

## 2016-08-25 ENCOUNTER — Encounter: Payer: Self-pay | Admitting: Physical Therapy

## 2016-08-25 ENCOUNTER — Ambulatory Visit: Payer: Medicare Other | Admitting: Physical Therapy

## 2016-08-25 ENCOUNTER — Ambulatory Visit: Payer: Medicare Other | Attending: Physician Assistant | Admitting: Physical Therapy

## 2016-08-25 DIAGNOSIS — R293 Abnormal posture: Secondary | ICD-10-CM | POA: Diagnosis present

## 2016-08-25 DIAGNOSIS — R2681 Unsteadiness on feet: Secondary | ICD-10-CM | POA: Diagnosis present

## 2016-08-25 DIAGNOSIS — M6281 Muscle weakness (generalized): Secondary | ICD-10-CM | POA: Insufficient documentation

## 2016-08-25 NOTE — Therapy (Signed)
[place] MAIN [facility] SERVICES [address] [place], [place], [address] Phone: [phone]   Fax:  [phone]  Physical Therapy Treatment  Patient Details  Name: Luke Durham MRN: [id number] Date of Birth: [birth year] Referring Provider: Marlyn Corporal  Encounter Date: 08/25/2016      PT End of Session - 08/25/16 1102    Visit Number 5   Number of Visits 8   Date for PT Re-Evaluation 09/13/16   Authorization Type G code 5   Authorization Time Period 10   PT Start Time 0853   PT Stop Time 0935   PT Time Calculation (min) 42 min   Equipment Utilized During Treatment Gait belt   Activity Tolerance Patient tolerated treatment well   Behavior During Therapy [facility] for tasks assessed/performed      Past Medical History:  Diagnosis Date  . Cancer ([place]) 7 years ago   lymphoma, [place] (recent)  . Colon polyps   . History of kidney stones     Past Surgical History:  Procedure Laterality Date  . HERNIA REPAIR  20 years  . SMALL INTESTINE SURGERY     Per patient 7 years    There were no vitals filed for this visit.      Subjective Assessment - 08/25/16 1101    Subjective Patient denies falls and states he has been able to perform his HEP and walk more often. He states that he will be having a neuro appointment soon.   Patient is accompained by: Family member   Pertinent History Factors affecting rehab: high co-pay, progressive disease, supportive family   Limitations Sitting;Standing;Walking   How long can you sit comfortably? 1 hour   How long can you stand comfortably? 20 min.   How long can you walk comfortably? level surface with RW, greater distance   Patient Stated Goals improve balance and flexibility   Currently in Pain? Yes   Pain Score 4    Pain Location Shoulder   Pain Orientation Right   Pain Descriptors / Indicators Aching   Pain Type Chronic pain     Treatment: Previous to TherAct: Seated PWR; requires min VCs for body  positioning, improved carryover from last session PWR UP: x5, instructed to improve counting out loud PWR Rock: x5, instructed to move ipsilateral leg outwards PWR Twist: x5, instructed to "open" in between turning PWR Step: x10, min tactile cues for initial set up, instructed to step with increase hip flexion and perform stomp  Standing PWR; requires CGA-minA for safety during activity, at // bars,  PWR Up: x10, x5, min-mod VCs and min tactile cues for appropriate positioning and sequencing PWR Rock: x10, x5, min VCs and tactile cues for increasing weight shift, mild increase in R shoulder pain with reaching Pwr Step: 2x10, min VCs for sequencing of movement  Once ins supine: Allowed to stretch in supine position, ~2 min. R shoulder assessed with increased tenderness to R bicep tendon and grinding in shoulder Manual Stretching in supine to BLEs, x10 for 5 sec holds each stretch D1 pattern (hip flexion and ER)  Hamstring x5 each LE, increased tightness on RLE compared to RLE  After TherEx: Bed mobility x2; patient requires max VCs and mod tactile cues for positioning and sequencing of the activity; instructed patient to move onto elbow and bring BLE upwards while lying down; requires minA with BLE and modA at upper extremity to understand movement to move onto bed; Instructed on log roll technique Requires min-mod VCs for  foot placement and bridging in order to move body into a more linear position on mat table.  Increased pain when coming onto R shoulder, sidelying to opposite direction for sitting up Rolling towards supine and sidelying, minA for movement and set up Scooting forward from mat, minA to increase weight shift to improve unweighting of contralateral hip and minA to bring hip forward, x4   Throughout ambulation, the patient requires minA for improved movement of the RW and safety.                              PT Education - 08/25/16 1102     Education provided Yes   Education Details bed mobility, seeing MD about R shoulder pain   Person(s) Educated Patient   Methods Explanation;Demonstration   Comprehension Verbalized understanding;Returned demonstration             PT Long Term Goals - 07/19/16 1045      PT LONG TERM GOAL #1   Title Patient will be independent in HEP in order to increase patient's ability to maintain gains achieved in therapy and assist with return to PLOF.    Time 8   Period Weeks   Status New     PT LONG TERM GOAL #2   Title Patient will decrease TUG test to <20 seconds in order to achieve an independently mobile status and decrease his risk for falls   Time 8   Period Weeks   Status New     PT LONG TERM GOAL #3   Title Patient will increase overall strength to 4+/5 in both UE and LE in order to complete transfers safely with decreased risk for falls.    Time 8   Period Weeks   Status New     PT LONG TERM GOAL #4   Title Patient will increase his 10 m walk test time to >1.0 m/s in order to be a community ambulator at decreased risk for falls.    Time 8   Period Weeks   Status New     PT LONG TERM GOAL #5   Title --   Time --   Period --   Status --               Plan - 08/25/16 1103    Clinical Impression Statement Pt continues to pesent to PT with his wife. They both state that he has been doing better and practicing his HEP more. He was able to tolerate standing exercises with max VCs and min tactile cues and demonstration. The patient has decreased freezing from initial session but does still have freezing episodes that are broken with specific VCs. Pt continues to have difficulty with bed mobility requiring min-modA, demonstration, and VCs. He reports increased R shoulder pain during overhead movements and bed mobility. The patient would continue to benefit from skilled PT in order to address safety, muscle weakness, and improve his ability to perform ADLs safely.   Rehab  Potential Fair   Clinical Impairments Affecting Rehab Potential Positive factors: motivated, family support, previous good responses to PT; Negative factors: high co-pay, progressive disease; Clinical Presentation: Evolving-multiple falls   PT Frequency 1x / week   PT Duration 8 weeks   PT Treatment/Interventions ADLs/Self Care Home Management;Aquatic Therapy;Electrical Stimulation;Moist Heat;DME Instruction;Gait training;Stair training;Functional mobility training;Therapeutic activities;Therapeutic exercise;Balance training;Neuromuscular re-education;Patient/family education;Manual techniques;Energy conservation   PT Next Visit Plan continue to progress PWR program  PT Home Exercise Plan HEP progressed-seated PWR program supine PWR UP and Step   Consulted and Agree with Plan of Care Patient;Family member/caregiver      Patient will benefit from skilled therapeutic intervention in order to improve the following deficits and impairments:  Abnormal gait, Decreased activity tolerance, Decreased balance, Decreased coordination, Decreased endurance, Decreased mobility, Decreased range of motion, Decreased safety awareness, Decreased strength, Hypomobility, Impaired flexibility, Improper body mechanics, Postural dysfunction  Visit Diagnosis: Unsteadiness on feet  Abnormal posture  Muscle weakness (generalized)     Problem List Patient Active Problem List   Diagnosis Date Noted  . Parkinson's syndrome ([place]) 06/14/2016  . Arthritis 06/14/2016  . Depression 06/14/2016  . Heartburn 06/14/2016  . Urinary incontinence 06/14/2016  . Skin lesion 06/14/2016  . Dementia 06/14/2016  . BPH (benign prostatic hyperplasia) 06/14/2016  . Bilateral edema of lower extremity 06/14/2016   Tilman Neat, SPT This entire session was performed under direct supervision and direction of a licensed therapist/therapist assistant . I have personally read, edited and approve of the note as  written.  Trotter,Margaret PT, DPT 08/25/2016, 1:26 PM  [place] MAIN [facility] SERVICES [address] [place], [place], [address] Phone: [phone]   Fax:  [phone]  Name: Kiwane Frohman MRN: [id number] Date of Birth: [birth year]

## 2016-08-29 ENCOUNTER — Encounter: Payer: Self-pay | Admitting: Physical Therapy

## 2016-08-29 ENCOUNTER — Ambulatory Visit: Payer: Medicare Other | Admitting: Physical Therapy

## 2016-08-29 DIAGNOSIS — M6281 Muscle weakness (generalized): Secondary | ICD-10-CM

## 2016-08-29 DIAGNOSIS — R293 Abnormal posture: Secondary | ICD-10-CM

## 2016-08-29 DIAGNOSIS — R2681 Unsteadiness on feet: Secondary | ICD-10-CM | POA: Diagnosis not present

## 2016-08-29 NOTE — Therapy (Signed)
[place] MAIN [facility] SERVICES [address] [place], [place], [address] Phone: [phone]   Fax:  [phone]  Physical Therapy Treatment  Patient Details  Name: Luke Durham MRN: [id number] Date of Birth: 07-18-1933 Referring Provider: Marlyn Corporal  Encounter Date: 08/29/2016      PT End of Session - 08/29/16 1136    Visit Number 6   Number of Visits 8   Date for PT Re-Evaluation 09/13/16   Authorization Type G code 6   Authorization Time Period 10   PT Start Time 1033   PT Stop Time 1118   PT Time Calculation (min) 45 min   Equipment Utilized During Treatment Gait belt   Activity Tolerance Patient tolerated treatment well   Behavior During Therapy [facility] for tasks assessed/performed      Past Medical History:  Diagnosis Date  . Cancer ([place]) 7 years ago   lymphoma, [place] (recent)  . Colon polyps   . History of kidney stones     Past Surgical History:  Procedure Laterality Date  . HERNIA REPAIR  20 years  . SMALL INTESTINE SURGERY     Per patient 7 years    There were no vitals filed for this visit.      Subjective Assessment - 08/29/16 1039    Subjective Patient denies falls. He states that he has been walking and performing his HEP at home but has questions. His wife states they are trying to fit him into an appointment for neurology   Patient is accompained by: Family member   Pertinent History Factors affecting rehab: high co-pay, progressive disease, supportive family   Limitations Sitting;Standing;Walking   How long can you sit comfortably? 1 hour   How long can you stand comfortably? 20 min.   How long can you walk comfortably? level surface with RW, greater distance   Patient Stated Goals improve balance and flexibility   Currently in Pain? Yes   Pain Score 3    Pain Location Shoulder   Pain Orientation Right   Pain Descriptors / Indicators Aching     Treatment:  Previous to TherAct: Seated PWR; requires min  VCs for body positioning, improved carryover from last session PWR Rock: x10, instructed to move ipsilateral leg outwards and reach towards sky PWR Step: x10, instructed to step with increase hip flexion  Standing PWR; requires CGA-minA for safety during activity, at // bars,  PWR Up: x10, x5, min-mod VCs and min tactile cues for appropriate positioning and sequencing especially with BUE PWR Rock: 2x10, without UE movement, min VCs for upright posture and to keep feet on ground when shifting weight, minA to begin shifting of weight Pwr Step: 2x10, min VCs for sequencing of movement  Standing forward and lateral stepping to dot, 2x10 each, CGA throughout, 1 HHA, min VCs to increase stepping distance and maintain upright posture  Once in supine: Allowed to stretch in supine position, ~1-2 min. Manual Stretching in supine to BLEs, x72for 5 sec holds each stretch D1 pattern (hip flexion and ER)  Hamstring x5 each LE, increased tightness on RLE compared to RLE  After TherEx: Bed mobility x2; patient requires max VCs and min tactile cues for positioning and sequencing of the activity; improved from last session although patient still struggles with sequencing the movement; instructed patient to move onto elbow and bring BLE upwards while lying down; requires minAwith BLE and BUE to understand movement to move onto bed; Instructed on log roll technique Requires min-mod VCs  and min tactile cues for foot placement and bridging in order to move body into a more linear position on mat table.  Rolling towards supine and sidelying, minA for movement at hips, patient fearful that the mat table is not wide enough during rolling  Throughout ambulation, the patient requires CGA-minA for stability and minor minA for improved movement of the RW and safety, although this has improved from last session especially with patient's ability to sequences turns.                               PT Education - 08/29/16 1135    Education provided Yes   Education Details bed mobility, stepping activity, safety with walker   Person(s) Educated Patient   Methods Explanation;Tactile cues   Comprehension Verbalized understanding;Tactile cues required             PT Long Term Goals - 07/19/16 1045      PT LONG TERM GOAL #1   Title Patient will be independent in HEP in order to increase patient's ability to maintain gains achieved in therapy and assist with return to PLOF.    Time 8   Period Weeks   Status New     PT LONG TERM GOAL #2   Title Patient will decrease TUG test to <20 seconds in order to achieve an independently mobile status and decrease his risk for falls   Time 8   Period Weeks   Status New     PT LONG TERM GOAL #3   Title Patient will increase overall strength to 4+/5 in both UE and LE in order to complete transfers safely with decreased risk for falls.    Time 8   Period Weeks   Status New     PT LONG TERM GOAL #4   Title Patient will increase his 10 m walk test time to >1.0 m/s in order to be a community ambulator at decreased risk for falls.    Time 8   Period Weeks   Status New     PT LONG TERM GOAL #5   Title --   Time --   Period --   Status --               Plan - 08/29/16 1136    Clinical Impression Statement Patient is accompanied by wife today. The patient continues to tolerate standing exercises well, but has forward flexed posture that he cannot fix to full upright position. He continues to requires max VCs for bed mobility but requires less assistance with movement of RW and min-mod VCs for placement. During weight shifts, the patient states he is nervous to move towards his LLE due to weakness. His shoulder pain is more controlled, but activities were limited to smaller movements with BUE and LE movements. The patient requres CGA-minA for safety, The patient would benefit from skilled PT in order to address safety, muscle  weakness, and improve his ability to perform ADLs.    Rehab Potential Fair   Clinical Impairments Affecting Rehab Potential Positive factors: motivated, family support, previous good responses to PT; Negative factors: high co-pay, progressive disease; Clinical Presentation: Evolving-multiple falls   PT Frequency 1x / week   PT Duration 8 weeks   PT Treatment/Interventions ADLs/Self Care Home Management;Aquatic Therapy;Electrical Stimulation;Moist Heat;DME Instruction;Gait training;Stair training;Functional mobility training;Therapeutic activities;Therapeutic exercise;Balance training;Neuromuscular re-education;Patient/family education;Manual techniques;Energy conservation   PT Next Visit Plan continue to progress PWR  program   PT Home Exercise Plan HEP progressed-seated PWR program supine PWR UP and Step   Consulted and Agree with Plan of Care Patient;Family member/caregiver      Patient will benefit from skilled therapeutic intervention in order to improve the following deficits and impairments:  Abnormal gait, Decreased activity tolerance, Decreased balance, Decreased coordination, Decreased endurance, Decreased mobility, Decreased range of motion, Decreased safety awareness, Decreased strength, Hypomobility, Impaired flexibility, Improper body mechanics, Postural dysfunction  Visit Diagnosis: Unsteadiness on feet  Abnormal posture  Muscle weakness (generalized)     Problem List Patient Active Problem List   Diagnosis Date Noted  . Parkinson's syndrome ([place]) 06/14/2016  . Arthritis 06/14/2016  . Depression 06/14/2016  . Heartburn 06/14/2016  . Urinary incontinence 06/14/2016  . Skin lesion 06/14/2016  . Dementia 06/14/2016  . BPH (benign prostatic hyperplasia) 06/14/2016  . Bilateral edema of lower extremity 06/14/2016   Tilman Neat, SPT This entire session was performed under direct supervision and direction of a licensed therapist/therapist assistant . I have personally  read, edited and approve of the note as written.  Trotter,Margaret PT, DPT 08/29/2016, 1:42 PM  [place] MAIN [facility] SERVICES [address] [place], [place], [address] Phone: [phone]   Fax:  [phone]  Name: Valin Hartsel MRN: [id number] Date of Birth: 09-26-1933

## 2016-08-31 ENCOUNTER — Ambulatory Visit: Payer: Medicare Other | Admitting: Physical Therapy

## 2016-09-05 ENCOUNTER — Encounter: Payer: Self-pay | Admitting: Physical Therapy

## 2016-09-05 ENCOUNTER — Ambulatory Visit: Payer: Medicare Other | Admitting: Physical Therapy

## 2016-09-05 DIAGNOSIS — R2681 Unsteadiness on feet: Secondary | ICD-10-CM | POA: Diagnosis not present

## 2016-09-05 DIAGNOSIS — M6281 Muscle weakness (generalized): Secondary | ICD-10-CM

## 2016-09-05 DIAGNOSIS — R293 Abnormal posture: Secondary | ICD-10-CM

## 2016-09-05 NOTE — Therapy (Signed)
[place] MAIN [facility] SERVICES [address] [place], [place], [address] Phone: [phone]   Fax:  [phone]  Physical Therapy Treatment  Patient Details  Name: Luke Durham MRN: [id number] Date of Birth: 22-May-1933 Referring Provider: Marlyn Corporal  Encounter Date: 09/05/2016      PT End of Session - 09/05/16 1355    Visit Number 7   Number of Visits 8   Date for PT Re-Evaluation 09/13/16   Authorization Type G code 7   Authorization Time Period 10   PT Start Time 1100   PT Stop Time 1146   PT Time Calculation (min) 46 min   Equipment Utilized During Treatment Gait belt   Activity Tolerance Patient tolerated treatment well   Behavior During Therapy [facility] for tasks assessed/performed      Past Medical History:  Diagnosis Date  . Cancer ([place]) 7 years ago   lymphoma, [place] (recent)  . Colon polyps   . History of kidney stones     Past Surgical History:  Procedure Laterality Date  . HERNIA REPAIR  20 years  . SMALL INTESTINE SURGERY     Per patient 7 years    There were no vitals filed for this visit.      Subjective Assessment - 09/05/16 1057    Subjective The patient states he had no falls. They have an appointment with Dr. Werner Lean who is a movement specialist on Thursday.    Patient is accompained by: Family member   Pertinent History Factors affecting rehab: high co-pay, progressive disease, supportive family   Limitations Sitting;Standing;Walking   How long can you sit comfortably? 1 hour   How long can you stand comfortably? 20 min.   How long can you walk comfortably? level surface with RW, greater distance   Patient Stated Goals improve balance and flexibility   Currently in Pain? Yes   Pain Score 5    Pain Location Shoulder   Pain Orientation Right      Treatment: Previous to TherAct: Gait Training: Instructed patient to navigate through 4 cones on the outside of each cone, x2 sets    Initial set: increased  freezing pattern and decreased ability to navigate cones, Luke with RW navigation and mod VCs to stay upright and more forward in RW  Seated PWR step: 2x10, min VCs to increase hip/knee flexion and perform stomp, Luke for improved LE positioning     Second set: Decreased freezing pattern with increase step pattern with decreased cueing and instructing patient to focus on a line or area to step over, min VCs to stay forward in RW for safety  Sit to stands, x5, min VCs to increase forward weight shift to improve patient's ease of standing, CGA for safety  Previous to TherAct: Standing PWR; requires CGA for safety during activity, at // bars, instructed to perform at home without UE due to shoulder pain; with a bed behind and chair in front for safety. PWR Up: 2x10, cont to require min-mod VCs for appropriate positioning and sequencing especially with BUE PWR Rock: 2x10, without UE movement, min VCs for upright posture and to keep feet on ground when shifting weight, Luke to shift weight side to side, patient notes he feels less safe moving towards the LLE. PWR Step: 2x10, min VCs for sequencing of movement, red tband on ground in order to improve patient's step distance  At 4 way box, instructed patient to perform:    Forward tapping with each LE, Luke for  stability, with RW suppot, min VCs on how to perform activity and to count out loud, x10 each LE    Forward/backward stepping, 2x10, 0 HHA, Luke for stability, min VCs to count out loud and to increase step distance    Lateral stepping, 2x10, 0 HHHA, Luke for stability, min VCs to increase step distance  Transitioned to supine: Allowed to relax into extension in supine position, ~1-2 min. Manual Stretching in supine to BLEs, x40for 5 sec holdseach stretch D1 pattern (hip flexion and ER)  Hamstring x5 each LE Mild increased tightness on LLE; patient has difficulty relaxing until stretch is held for a longer  time  AfterTherEx: Instructed on log roll technique Bed mobility x2; patient requires mod VCs and decreased tactile cues for positioning and sequencing of the activity; Continues to improve from previous sessions but still notes increase R shoulder pain when in R sidelying Requires minAwith BLE to move into full sidelying Requires min VCs and min tactile cues for foot placement and bridging in order to move body into a more linear position on mat table; patient also demonstrates improvement in this area Rolling towards supine and sidelying, min VCs to reach with contralateral arm, Luke to with hips to move into full sidelying, Luke to move BLE so patient come move into sitting.  Throughout ambulation, the patient requires CGA for stability and Luke for improved movement of the RW and safety; requires multiple VCs to increase step length, stay upright, and stay forward in the RW.                          PT Education - 09/05/16 1355    Education provided Yes   Education Details bed mobility, progression of HEP-standing PWR without UE movements   Person(s) Educated Patient;Spouse   Methods Explanation;Demonstration;Verbal cues;Handout   Comprehension Verbalized understanding;Returned demonstration;Verbal cues required             PT Long Term Goals - 07/19/16 1045      PT LONG TERM GOAL #1   Title Patient will be independent in HEP in order to increase patient's ability to maintain gains achieved in therapy and assist with return to PLOF.    Time 8   Period Weeks   Status New     PT LONG TERM GOAL #2   Title Patient will decrease TUG test to <20 seconds in order to achieve an independently mobile status and decrease his risk for falls   Time 8   Period Weeks   Status New     PT LONG TERM GOAL #3   Title Patient will increase overall strength to 4+/5 in both UE and LE in order to complete transfers safely with decreased risk for falls.    Time 8    Period Weeks   Status New     PT LONG TERM GOAL #4   Title Patient will increase his 10 m walk test time to >1.0 m/s in order to be a community ambulator at decreased risk for falls.    Time 8   Period Weeks   Status New     PT LONG TERM GOAL #5   Title --   Time --   Period --   Status --               Plan - 09/05/16 1356    Clinical Impression Statement The patient continues to be accompanied by his wife. He demonstrates carry  over with improved ability to manuever his walker and decreased cueing with seated PWR exercises; however he still demonstrates difficulty performing tasks without multiple verbal cues and has consistent freezing during initiation of movements. He notes increased shoulder pain with R sidelying but subsides once out of the positioning. Activities were still limited to decreased UE movements due to shoulder pain. The patient requires CGA-Luke for safety and manuever of RW and mod-max VCs to continue with appropriate exercise. The patient would benefit from continued skilled PT in order to address safety, muscle weakness, and improve his ability to safely perform ADLs.    Rehab Potential Fair   Clinical Impairments Affecting Rehab Potential Positive factors: motivated, family support, previous good responses to PT; Negative factors: high co-pay, progressive disease; Clinical Presentation: Evolving-multiple falls   PT Frequency 1x / week   PT Duration 8 weeks   PT Treatment/Interventions ADLs/Self Care Home Management;Aquatic Therapy;Electrical Stimulation;Moist Heat;DME Instruction;Gait training;Stair training;Functional mobility training;Therapeutic activities;Therapeutic exercise;Balance training;Neuromuscular re-education;Patient/family education;Manual techniques;Energy conservation   PT Next Visit Plan continue to progress PWR program   PT Home Exercise Plan HEP progressed-seated PWR program supine PWR UP and Step and standing PWR except for twist    Consulted and Agree with Plan of Care Patient;Family member/caregiver      Patient will benefit from skilled therapeutic intervention in order to improve the following deficits and impairments:  Abnormal gait, Decreased activity tolerance, Decreased balance, Decreased coordination, Decreased endurance, Decreased mobility, Decreased range of motion, Decreased safety awareness, Decreased strength, Hypomobility, Impaired flexibility, Improper body mechanics, Postural dysfunction  Visit Diagnosis: Unsteadiness on feet  Abnormal posture  Muscle weakness (generalized)     Problem List Patient Active Problem List   Diagnosis Date Noted  . Parkinson's syndrome ([place]) 06/14/2016  . Arthritis 06/14/2016  . Depression 06/14/2016  . Heartburn 06/14/2016  . Urinary incontinence 06/14/2016  . Skin lesion 06/14/2016  . Dementia 06/14/2016  . BPH (benign prostatic hyperplasia) 06/14/2016  . Bilateral edema of lower extremity 06/14/2016   Tilman Neat, SPT This entire session was performed under direct supervision and direction of a licensed therapist/therapist assistant . I have personally read, edited and approve of the note as written.  Trotter,Margaret PT, DPT 09/05/2016, 5:13 PM  [place] MAIN [facility] SERVICES [address] [place], [place], [address] Phone: [phone]   Fax:  [phone]  Name: Luke Durham MRN: [id number] Date of Birth: [birth year]

## 2016-09-07 ENCOUNTER — Ambulatory Visit: Payer: Medicare Other | Admitting: Physical Therapy

## 2016-09-13 ENCOUNTER — Ambulatory Visit: Payer: Medicare Other | Admitting: Physical Therapy

## 2016-09-13 ENCOUNTER — Encounter: Payer: Self-pay | Admitting: Physical Therapy

## 2016-09-13 DIAGNOSIS — M6281 Muscle weakness (generalized): Secondary | ICD-10-CM

## 2016-09-13 DIAGNOSIS — R2681 Unsteadiness on feet: Secondary | ICD-10-CM

## 2016-09-13 DIAGNOSIS — R293 Abnormal posture: Secondary | ICD-10-CM

## 2016-09-13 NOTE — Therapy (Signed)
[place] MAIN [facility] SERVICES [address] [place], [place], [address] Phone: [phone]   Fax:  [phone]  Physical Therapy Treatment  Patient Details  Name: Luke Durham MRN: [id number] Date of Birth: 1933-07-06 Referring Provider: Marlyn Corporal  Encounter Date: 09/13/2016      PT End of Session - 09/13/16 1109    Visit Number 8   Number of Visits 8   Date for PT Re-Evaluation 09/13/16   Authorization Type G code 8   Authorization Time Period 10   PT Start Time 0930   PT Stop Time 1020   PT Time Calculation (min) 50 min   Equipment Utilized During Treatment Gait belt   Activity Tolerance Patient tolerated treatment well   Behavior During Therapy [facility] for tasks assessed/performed      Past Medical History:  Diagnosis Date  . Cancer ([place]) 7 years ago   lymphoma, [place] (recent)  . Colon polyps   . History of kidney stones     Past Surgical History:  Procedure Laterality Date  . HERNIA REPAIR  20 years  . SMALL INTESTINE SURGERY     Per patient 7 years    There were no vitals filed for this visit.      Subjective Assessment - 09/13/16 0941    Subjective Patient reports no new falls; He reports mixed compliance with HEP; wife reports that patient has been walking more at home which is good.    Patient is accompained by: Family member   Pertinent History Factors affecting rehab: high co-pay, progressive disease, supportive family   Limitations Sitting;Standing;Walking   How long can you sit comfortably? 1 hour   How long can you stand comfortably? 20 min.   How long can you walk comfortably? level surface with RW, greater distance   Patient Stated Goals improve balance and flexibility   Currently in Pain? Yes   Pain Score 5    Pain Location Shoulder   Pain Orientation Right   Pain Descriptors / Indicators Aching;Sore   Pain Type Chronic pain         TREATMENT: Warm up on Nustep LUE and BLE level 0 x5 min with mod  VCs to keep SPM >50 for better intensity and cardiovascular endurance;  Patient requires mod VCs for hand placement with transfers for safety;  PT instructed patient in PWR exercise:  Seated: Stretch into lumbar flexion (touch floor) and extension, reach towards sky with head up 5 sec hold x4; Stretch with thoracolumbar rotation 10 sec hold x2 each;  Seated PWR: x10 reps with max VCs, demonstration to improve ROM, technique and positioning; Patient demonstrates difficulty with reaching overhead with RUE discomfort; Patient required max VCs to increase "clap" with PWR twist and to increase "Stomp" with PWR step; PT utilized boosts to improve cognitive challenge and learning; Seated PWR flow x5 reps with max VCs and demonstration for technique; Patient had harder time with flow due to slow mobility with transitions;  Standing PWR x10 each, with close supervision and max VCs, tactile cues to improve ROM, clap with PWR twist and stomp with PWR step; PT instructed patient to improve speed by playing music and trying to match beat when exercising at home.  Patient instructed to perform exercise at least 1x every day, no excuses. Instructed patient to try standing PWR step when freezing to improve foot movement. He has most difficulty with turns and with doorways;  PT Education - 09/13/16 1035    Education provided Yes   Education Details HEP compliance PWR exercise, posture/positioning, gait safety   Person(s) Educated Patient;Spouse   Methods Explanation;Demonstration;Tactile cues;Verbal cues   Comprehension Verbalized understanding;Returned demonstration;Verbal cues required             PT Long Term Goals - 07/19/16 1045      PT LONG TERM GOAL #1   Title Patient will be independent in HEP in order to increase patient's ability to maintain gains achieved in therapy and assist with return to PLOF.    Time 8   Period Weeks   Status New     PT  LONG TERM GOAL #2   Title Patient will decrease TUG test to <20 seconds in order to achieve an independently mobile status and decrease his risk for falls   Time 8   Period Weeks   Status New     PT LONG TERM GOAL #3   Title Patient will increase overall strength to 4+/5 in both UE and LE in order to complete transfers safely with decreased risk for falls.    Time 8   Period Weeks   Status New     PT LONG TERM GOAL #4   Title Patient will increase his 10 m walk test time to >1.0 m/s in order to be a community ambulator at decreased risk for falls.    Time 8   Period Weeks   Status New     PT LONG TERM GOAL #5   Title --   Time --   Period --   Status --               Plan - 09/13/16 1109    Clinical Impression Statement Patient presents to PT with his wife; He reports mixed compliance with HEP. PT instructed patient in seated and standing PWR exercise. Patient required frequent cues and boosts to improve mobility. PT also instructed patient in prep stretch to improve ROM. Patient able to demonstrate better gait mobility following exercise. He does have trouble with freezing and shuffled steps with turns and doorways. Instructed patient to perform standing step exercise with emphasis on stomping to reduce freezing. Patient would benefit from additional skilled PT intervention to improve mobility and safety. Will re-assess goals next visit.    Rehab Potential Fair   Clinical Impairments Affecting Rehab Potential Positive factors: motivated, family support, previous good responses to PT; Negative factors: high co-pay, progressive disease; Clinical Presentation: Evolving-multiple falls   PT Frequency 1x / week   PT Duration 8 weeks   PT Treatment/Interventions ADLs/Self Care Home Management;Aquatic Therapy;Electrical Stimulation;Moist Heat;DME Instruction;Gait training;Stair training;Functional mobility training;Therapeutic activities;Therapeutic exercise;Balance  training;Neuromuscular re-education;Patient/family education;Manual techniques;Energy conservation   PT Next Visit Plan continue to progress PWR program   PT Home Exercise Plan HEP progressed-seated PWR program supine PWR UP and Step and standing PWR except for twist; assess goals   Consulted and Agree with Plan of Care Patient;Family member/caregiver      Patient will benefit from skilled therapeutic intervention in order to improve the following deficits and impairments:  Abnormal gait, Decreased activity tolerance, Decreased balance, Decreased coordination, Decreased endurance, Decreased mobility, Decreased range of motion, Decreased safety awareness, Decreased strength, Hypomobility, Impaired flexibility, Improper body mechanics, Postural dysfunction  Visit Diagnosis: Unsteadiness on feet  Abnormal posture  Muscle weakness (generalized)     Problem List Patient Active Problem List   Diagnosis Date Noted  . Parkinson's syndrome ([place]) 06/14/2016  .  Arthritis 06/14/2016  . Depression 06/14/2016  . Heartburn 06/14/2016  . Urinary incontinence 06/14/2016  . Skin lesion 06/14/2016  . Dementia 06/14/2016  . BPH (benign prostatic hyperplasia) 06/14/2016  . Bilateral edema of lower extremity 06/14/2016    Trotter,Margaret PT, DPT 09/13/2016, 11:13 AM  [place] MAIN [facility] SERVICES [address] [place], [place], [address] Phone: [phone]   Fax:  [phone]  Name: Luke Durham MRN: [id number] Date of Birth: 07-12-1933

## 2016-09-19 ENCOUNTER — Ambulatory Visit: Payer: Medicare Other | Admitting: Physical Therapy

## 2016-09-19 ENCOUNTER — Encounter: Payer: Self-pay | Admitting: Physical Therapy

## 2016-09-19 DIAGNOSIS — R2681 Unsteadiness on feet: Secondary | ICD-10-CM | POA: Diagnosis not present

## 2016-09-19 DIAGNOSIS — M6281 Muscle weakness (generalized): Secondary | ICD-10-CM

## 2016-09-19 DIAGNOSIS — R293 Abnormal posture: Secondary | ICD-10-CM

## 2016-09-19 NOTE — Therapy (Signed)
[place]  [facility] [address] [place], [place], [address] Phone: [phone]   Fax:  [phone]  Physical Therapy Treatment  Patient Details  Name: Luke Durham MRN: [id number] Date of Birth: 05/24/1933 Referring Provider: [place]  Encounter Date: 09/19/2016      PT End of Session - 09/19/16 1253    Visit Number 9   Number of Visits 16   Date for PT Re-Evaluation 11/14/16   Authorization Type G code 9   Authorization Time Period 10   PT Start Time 1100   PT Stop Time 1145   PT Time Calculation (min) 45 min   Equipment Utilized During Treatment Gait belt   Activity Tolerance Patient tolerated treatment well   Behavior During Therapy [facility] for tasks assessed/performed      Past Medical History:  Diagnosis Date  . Cancer ([facility]) 7 years ago   lymphoma, [facility] (recent)  . Colon polyps   . History of kidney stones     Past Surgical History:  Procedure Laterality Date  . HERNIA REPAIR  20 years  . SMALL INTESTINE SURGERY     Per patient 7 years    There were no vitals filed for this visit.      Subjective Assessment - 09/19/16 1112    Subjective patient reports being tired over the weekend. his wife reports that he has had more tremors in the AM and has had a harder time moving around. He reports walking during the day but hasn't been doing PWR exercise.    Patient is accompained by: Family member   Pertinent History Factors affecting rehab: high co-pay, progressive disease, supportive family   Limitations Sitting;Standing;Walking   How long can you sit comfortably? 1 hour   How long can you stand comfortably? 20 min.   How long can you walk comfortably? level surface with RW, greater distance   Patient Stated Goals improve balance and flexibility   Currently in Pain? Yes   Pain Score 3    Pain Location Shoulder   Pain Orientation Right   Pain Descriptors / Indicators Aching;Sore   Pain Type Chronic pain             [facility] PT Assessment - 09/19/16 0001      Standardized Balance Assessment   10 Meter Walk 1.0 m/s with RW with close supervision (community ambulator, improved from 07/19/16 which was 0.625)     Timed Up and Go Test   Normal TUG (seconds) 84   TUG Comments with RW, min A with increased freezing during turns; patient took longer than last assessment on 07/19/16 which was 48.8 sec         TREATMENT: Warm up on Nustep LUE and BLE level 0 x5 min with mod VCs to keep SPM >60 for better intensity and cardiovascular endurance;  Patient requires mod VCs for hand placement with transfers for safety;  Instructed patient in timed up and go and 10 meter walk tests to determine progress towards goals. Patient has increased difficulty with timed up and go with freezing during turns;  PT instructed patient in PWR exercise:  Standing PWR 2 x10 each, with close supervision and max VCs, tactile cues to improve ROM, clap with PWR twist and stomp with PWR step; He required increased cues to improve erect posture and utilize mirror for better visual cues during standing exercise;  Patient instructed to perform exercise at least 1x every day, no excuses. Instructed patient to try standing   PWR step when freezing to improve foot movement. He has most difficulty with turns and with doorways;                          PT Education - 09/19/16 1253    Education provided Yes   Education Details HEP compliance, PWR exercise, gait safety   Person(s) Educated Patient;Spouse   Methods Explanation;Demonstration;Verbal cues   Comprehension Returned demonstration;Verbalized understanding;Verbal cues required             PT Long Term Goals - 09/19/16 1114      PT LONG TERM GOAL #1   Title Patient will be independent in HEP in order to increase patient's ability to maintain gains achieved in therapy and assist with return to PLOF.    Time 8   Period Weeks   Status  On-going     PT LONG TERM GOAL #2   Title Patient will decrease TUG test to <20 seconds in order to achieve an independently mobile status and decrease his risk for falls   Time 8   Period Weeks   Status Not Met     PT LONG TERM GOAL #3   Title Patient will increase overall strength to 4+/5 in both UE and LE in order to complete transfers safely with decreased risk for falls.    Time 8   Period Weeks   Status Partially Met     PT LONG TERM GOAL #4   Title Patient will increase his 10 m walk test time to >1.0 m/s in order to be a community ambulator at decreased risk for falls.    Time 8   Period Weeks   Status Partially Met               Plan - 09/19/16 1253    Clinical Impression Statement Patient instructed in outcome measures to assess progress towards goals. He demonstrates overall improved gait speed when walking forward. However does continue to have significant freezing during turns. Patient therefore, took a longer time to complete timed up and go due to turns. Patient required max VCs for hand placement with transfers for safety; Re-educated patient in standing PWR exercise with cues for positioning to improve coordination and mobility. He would benefit from additional skilled PT intervention to improve balance/gait safety and reduce fall risk;    Rehab Potential Fair   Clinical Impairments Affecting Rehab Potential Positive factors: motivated, family support, previous good responses to PT; Negative factors: high co-pay, progressive disease; Clinical Presentation: Evolving-multiple falls   PT Frequency 1x / week   PT Duration 8 weeks   PT Treatment/Interventions ADLs/Self Care Home Management;Aquatic Therapy;Electrical Stimulation;Moist Heat;DME Instruction;Gait training;Stair training;Functional mobility training;Therapeutic activities;Therapeutic exercise;Balance training;Neuromuscular re-education;Patient/family education;Manual techniques;Energy conservation   PT Next  Visit Plan continue to progress PWR program   PT Home Exercise Plan HEP progressed-seated PWR program supine PWR UP and Step and standing PWR except for twist; assess goals   Consulted and Agree with Plan of Care Patient;Family member/caregiver      Patient will benefit from skilled therapeutic intervention in order to improve the following deficits and impairments:  Abnormal gait, Decreased activity tolerance, Decreased balance, Decreased coordination, Decreased endurance, Decreased mobility, Decreased range of motion, Decreased safety awareness, Decreased strength, Hypomobility, Impaired flexibility, Improper body mechanics, Postural dysfunction  Visit Diagnosis: Unsteadiness on feet  Abnormal posture  Muscle weakness (generalized)     Problem List Patient Active Problem List   Diagnosis Date   Noted  . Parkinson's syndrome ([facility]) 06/14/2016  . Arthritis 06/14/2016  . Depression 06/14/2016  . Heartburn 06/14/2016  . Urinary incontinence 06/14/2016  . Skin lesion 06/14/2016  . Dementia 06/14/2016  . BPH (benign prostatic hyperplasia) 06/14/2016  . Bilateral edema of lower extremity 06/14/2016    Trotter,Margaret PT, DPT 09/19/2016, 12:57 PM  [place] [facility] [address] [place], [place], [address] Phone: [phone]   Fax:  [phone]  Name: Luke Durham MRN: [id number] Date of Birth: 04/25/1933    

## 2016-09-28 ENCOUNTER — Ambulatory Visit: Payer: Medicare Other | Admitting: Physical Therapy

## 2016-10-03 ENCOUNTER — Ambulatory Visit: Payer: Medicare Other | Admitting: Physical Therapy

## 2016-10-05 ENCOUNTER — Ambulatory Visit (INDEPENDENT_AMBULATORY_CARE_PROVIDER_SITE_OTHER): Payer: Medicare Other | Admitting: Physician Assistant

## 2016-10-05 ENCOUNTER — Encounter: Payer: Self-pay | Admitting: Physician Assistant

## 2016-10-05 VITALS — BP 112/68 | HR 60 | Temp 97.7°F | Resp 16 | Wt 171.0 lb

## 2016-10-05 DIAGNOSIS — I959 Hypotension, unspecified: Secondary | ICD-10-CM | POA: Diagnosis not present

## 2016-10-05 DIAGNOSIS — M79601 Pain in right arm: Secondary | ICD-10-CM | POA: Diagnosis not present

## 2016-10-05 DIAGNOSIS — R3121 Asymptomatic microscopic hematuria: Secondary | ICD-10-CM | POA: Diagnosis not present

## 2016-10-05 DIAGNOSIS — R55 Syncope and collapse: Secondary | ICD-10-CM | POA: Diagnosis not present

## 2016-10-05 DIAGNOSIS — F0280 Dementia in other diseases classified elsewhere without behavioral disturbance: Secondary | ICD-10-CM | POA: Diagnosis not present

## 2016-10-05 DIAGNOSIS — G20 Parkinson's disease: Secondary | ICD-10-CM

## 2016-10-05 DIAGNOSIS — R2681 Unsteadiness on feet: Secondary | ICD-10-CM | POA: Diagnosis not present

## 2016-10-05 DIAGNOSIS — G20A1 Parkinson's disease without dyskinesia, without mention of fluctuations: Secondary | ICD-10-CM

## 2016-10-05 LAB — POCT URINALYSIS DIPSTICK
Bilirubin, UA: NEGATIVE
Glucose, UA: NEGATIVE
Ketones, UA: NEGATIVE
Leukocytes, UA: NEGATIVE
Nitrite, UA: NEGATIVE
Spec Grav, UA: >=1.03
Urobilinogen, UA: 0.2
pH, UA: 6

## 2016-10-05 NOTE — Patient Instructions (Signed)
Hypotension As your heart beats, it forces blood through your body. This force is called blood pressure. If you have hypotension, you have low blood pressure. When your blood pressure is too low, you may not get enough blood to your brain. You may feel weak, feel light-headed, have a fast heartbeat, or even pass out (faint). Follow these instructions at home: Eating and drinking  Drink enough fluids to keep your pee (urine) clear or pale yellow.  Eat a healthy diet, and follow instructions from your doctor about eating or drinking restrictions. A healthy diet includes: ? Fresh fruits and vegetables. ? Whole grains. ? Low-fat (lean) meats. ? Low-fat dairy products.  Eat extra salt only as told. Do not add extra salt to your diet unless your doctor tells you to.  Eat small meals often.  Avoid standing up quickly after you eat. Medicines  Take over-the-counter and prescription medicines only as told by your doctor. ? Follow instructions from your doctor about changing how much you take (the dosage) of your medicines, if this applies. ? Do not stop or change your medicine on your own. General instructions  Wear compression stockings as told by your doctor.  Get up slowly from lying down or sitting.  Avoid hot showers and a lot of heat as told by your doctor.  Return to your normal activities as told by your doctor. Ask what activities are safe for you.  Do not use any products that contain nicotine or tobacco, such as cigarettes and e-cigarettes. If you need help quitting, ask your doctor.  Keep all follow-up visits as told by your doctor. This is important. Contact a doctor if:  You throw up (vomit).  You have watery poop (diarrhea).  You have a fever for more than 2-3 days.  You feel more thirsty than normal.  You feel weak and tired. Get help right away if:  You have chest pain.  You have a fast or irregular heartbeat.  You lose feeling (get numbness) in any part  of your body.  You cannot move your arms or your legs.  You have trouble talking.  You get sweaty or feel light-headed.  You faint.  You have trouble breathing.  You have trouble staying awake.  You feel confused. This information is not intended to replace advice given to you by your health care provider. Make sure you discuss any questions you have with your health care provider. Document Released: 01/04/2010 Document Revised: 06/28/2016 Document Reviewed: 06/28/2016 Elsevier Interactive Patient Education  2017 Elsevier Inc.   

## 2016-10-05 NOTE — Progress Notes (Signed)
Patient: Luke Durham Male    DOB: 1932/12/18   82 y.o.   MRN: [id number] Visit Date: 10/05/2016  Today's Provider: Mar Daring, PA-C   Chief Complaint  Patient presents with  . Hospitalization Follow-up   Subjective:    HPI    Follow up Hospitalization  Patient was admitted to [facility] in [place], [place] on 10/01/2016 and discharged on 10/02/2016. He was treated for near syncope and hematuria. Treatment for this included "running tests". I am told that he had an MRI, CT, urinalysis, EKG, other labs. His wife reports that all testing was negative. Wife reports his SBP was 60 at the time of the near syncope. The etiology of the near syncope was never clear, per wife. No medications changes were made. They have been in [place] for a wedding and his wife reports that things have been stressful the day before as they traveled in the bad weather and then having to run around for the wedding. She states that it is possible that he could've been dehydrated. He reports good compliance with treatment. He reports this condition is Improved. Pt has not had another episode since.  He does have Parkinson's disease and has been followed by Dr. Werner Durham at [facility] neurology. He and his wife are requesting a referral to see Dr. Manuella Ghazi with [facility] so that they may have someone closer as it is very difficult for them to get to [facility].  [name] has also been doing physical therapy with [facility] and is needing this to be restarted since his hospitalization. They're also requesting occupational therapy for his Parkinson's.  Also of note during physical therapy [name] noted that he was having some anterior right shoulder pain. He states that this has been happening off and on for a couple of years since a fall that he had while he was in [place]. Workup in [place] was negative per wife and patient. They are requesting a referral to  Dr. Roland Rack for further evaluation and possible cortisone injection. ------------------------------------------------------------------------------------   No Known Allergies   Current Outpatient Prescriptions:  .  amantadine (SYMMETREL) 100 MG capsule, Take 1 capsule (100 mg total) by mouth 3 (three) times daily., Disp: 270 capsule, Rfl: 3 .  carbidopa-levodopa (SINEMET IR) 25-100 MG tablet, take 2 tablets by mouth four times a day as directed, Disp: 270 tablet, Rfl: 11 .  docusate sodium (STOOL SOFTENER) 100 MG capsule, Take 100 mg by mouth 2 (two) times daily., Disp: , Rfl:  .  donepezil (ARICEPT ODT) 10 MG disintegrating tablet, Take 1 tablet (10 mg total) by mouth at bedtime., Disp: 90 tablet, Rfl: 3 .  finasteride (PROSCAR) 5 MG tablet, Take 1 tablet (5 mg total) by mouth daily., Disp: 90 tablet, Rfl: 3 .  furosemide (LASIX) 20 MG tablet, Take 1 tablet (20 mg total) by mouth daily., Disp: 90 tablet, Rfl: 3 .  Multiple Vitamins-Minerals (OCUVITE PO), Take by mouth., Disp: , Rfl:  .  Pimavanserin Tartrate (NUPLAZID) 17 MG TABS, Take 1 tablet by mouth daily., Disp: , Rfl:  .  sertraline (ZOLOFT) 50 MG tablet, Take 1 tablet (50 mg total) by mouth daily., Disp: 90 tablet, Rfl: 3 .  tamsulosin (FLOMAX) 0.4 MG CAPS capsule, Take 1 capsule (0.4 mg total) by mouth daily., Disp: 90 capsule, Rfl: 3 .  aspirin 81 MG tablet, Take 81 mg by mouth daily., Disp: , Rfl:  .  Cholecalciferol (VITAMIN D3 PO),  Take by mouth., Disp: , Rfl:   Review of Systems  Constitutional: Positive for fatigue (pt's baseline). Negative for activity change, appetite change, chills, diaphoresis, fever and unexpected weight change.  Eyes: Negative for visual disturbance.  Respiratory: Negative.   Cardiovascular: Negative.   Gastrointestinal: Negative.   Genitourinary: Positive for hematuria (Noted during hospitalization per wife). Negative for decreased urine volume, discharge, dysuria, enuresis, frequency, penile pain,  penile swelling, scrotal swelling, testicular pain and urgency.  Musculoskeletal: Positive for arthralgias (right shoulder) and gait problem (parkinson shuffle).  Neurological: Positive for weakness. Negative for dizziness, syncope, light-headedness, numbness and headaches.  Psychiatric/Behavioral: Negative.     Social History  Substance Use Topics  . Smoking status: Never Smoker  . Smokeless tobacco: Never Used  . Alcohol use Yes     Comment: occasional   Objective:   BP 112/68 (BP Location: Left Arm, Patient Position: Sitting, Cuff Size: Normal)   Pulse 60   Temp 97.7 F (36.5 C) (Oral)   Resp 16   Wt 171 lb (77.6 kg)   Physical Exam  Constitutional: He appears well-developed and well-nourished. No distress.  HENT:  Head: Normocephalic and atraumatic.  Neck: Normal range of motion. Neck supple.  Cardiovascular: Normal rate, regular rhythm and normal heart sounds.  Exam reveals no gallop and no friction rub.   No murmur heard. Pulmonary/Chest: Effort normal and breath sounds normal. No respiratory distress. He has no wheezes. He has no rales.  Musculoskeletal:       Right shoulder: He exhibits decreased range of motion, tenderness and decreased strength. He exhibits no bony tenderness, no swelling, no spasm and normal pulse.  Skin: He is not diaphoretic.  Psychiatric: He has a normal mood and affect. His behavior is normal. Judgment and thought content normal.  Vitals reviewed.     Assessment & Plan:     1. Parkinson's syndrome ([place]) Will place referral to restart physical therapy status post hospitalization. I have also placed a new order for occupational therapy. Will also refer to Dr. Manuella Ghazi with [facility] so that they may establish closer to home. Per physical therapy notes Parkinson's gait has progressed. I will see back in 3 months. - Ambulatory referral to Occupational Therapy - Ambulatory referral to Neurology - Ambulatory referral to Physical Therapy  2.  Dementia due to Parkinson's disease without behavioral disturbance ([place]) See above medical treatment plan. - Ambulatory referral to Occupational Therapy - Ambulatory referral to Neurology - Ambulatory referral to Physical Therapy  3. Right arm pain Previously worked up in [place] prior to moving down. Noted on and off pain that bothered him some in physical therapy recently. Patient and wife are requesting referral for Dr. Roland Rack. This has been placed as below. They're wanting referral for evaluation and consideration of a cortisone injection. - Ambulatory referral to Orthopedic Surgery  4. Gait instability See above medical treatment plan for #1. - Ambulatory referral to Physical Therapy  5. Hypotension, unspecified hypotension type Hypotension most likely cause of near syncopal episode that occurred in [place]. It is possible that this could've been secondary to dehydration. I have requested records from [facility] and will review these once they are received. I will also check labs as below and follow-up pending these results. - CBC w/Diff/Platelet - Comprehensive Metabolic Panel (CMET) - HgB A1c  6. Near syncope See above medical treatment plan. - CBC w/Diff/Platelet - Comprehensive Metabolic Panel (CMET) - HgB A1c  7. Asymptomatic microscopic hematuria Patient reports asymptomatic hematuria  that was only noted on urine microscopy during hospitalization. UA was checked today in the office and did show nonhemolyzed trace hematuria. I have sent urine for my Clotilde Dieter and will follow-up pending results. I have also placed a referral to [facility] as below for workup of asymptomatic hematuria since patient does have a history of cancer, believed to have been lymphoma, and BPH for which he currently takes Flomax and Proscar. - POCT Urinalysis Dipstick - Urine Microscopic - Ambulatory referral to Urology     Patient seen and examined by Mar Daring, PA-C, and note scribed by Renaldo Fiddler, CMA.  Mar Daring, PA-C  [facility]

## 2016-10-06 LAB — URINALYSIS, MICROSCOPIC ONLY
Casts: NONE SEEN /lpf
WBC, UA: >30 /hpf — AB (ref 0–?)

## 2016-10-07 ENCOUNTER — Telehealth: Payer: Self-pay

## 2016-10-07 ENCOUNTER — Encounter: Payer: Self-pay | Admitting: Physician Assistant

## 2016-10-07 NOTE — Telephone Encounter (Signed)
[id number] 

## 2016-10-07 NOTE — Telephone Encounter (Signed)
Patient's wife advised as directed below.  Thanks,  -Joseline

## 2016-10-07 NOTE — Telephone Encounter (Signed)
-----   Message from Mar Daring, [place] sent at 10/07/2016  1:33 PM EST ----- Urine did have some WBC and crystals. No red blood cells noted. Has patient had any h/o renal stones? Also I evaluated records from [place]. They had even done a renal US which was normal. I do recommend continuing follow up with urology to further evaluate these changes noted in the urine.

## 2016-10-10 ENCOUNTER — Encounter: Payer: Self-pay | Admitting: Urology

## 2016-10-10 ENCOUNTER — Ambulatory Visit: Payer: Medicare Other | Admitting: Urology

## 2016-10-10 DIAGNOSIS — R350 Frequency of micturition: Secondary | ICD-10-CM | POA: Diagnosis not present

## 2016-10-10 DIAGNOSIS — R3129 Other microscopic hematuria: Secondary | ICD-10-CM | POA: Diagnosis not present

## 2016-10-10 LAB — URINALYSIS, COMPLETE
Bilirubin, UA: NEGATIVE
Glucose, UA: NEGATIVE
Nitrite, UA: NEGATIVE
Protein, UA: NEGATIVE
RBC, UA: NEGATIVE
Specific Gravity, UA: 1.025 (ref 1.005–1.030)
Urobilinogen, Ur: 0.2 mg/dL (ref 0.2–1.0)
pH, UA: 6 (ref 5.0–7.5)

## 2016-10-10 LAB — MICROSCOPIC EXAMINATION
Epithelial Cells (non renal): NONE SEEN /hpf (ref 0–10)
RBC, UA: NONE SEEN /hpf (ref 0–?)

## 2016-10-10 NOTE — Progress Notes (Signed)
10/10/2016 12:27 PM   Luke Durham April 04, 1933 [id number]  Referring provider: Mar Daring, PA-C [place], [place] [address]  CC: - new patient, microscopic hematuria  HPI:   1 - Gross Hematuria - sig blood on UA at ER in [place] during eval for syncope. Non smoker. No chronic solvent exposure.  Two likely gross episodes with some stringly clot 09/2016.  2 - Lower Urinary Tract Symptoms - on tamsulosin + finasteride x years for mix of irritative and obstructive sympotms with good conrol. 09/2016 DRE 50gm firm/fixed.   3 - Prostate Screening - NoFHX prostate cancer. Undergoing "for cause" eval as part of hematuria work up 2017 at age 82. DRE 09/2016 50gm firm / fixed (very concerning).   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 He and his wife live independently.   Today "Luke Durham" is seen as new patient for above.    PMH: Past Medical History:  Diagnosis Date  . Cancer ([place]) 7 years ago   lymphoma, [place] (recent)  . Colon polyps   . History of kidney stones     Surgical History: Past Surgical History:  Procedure Laterality Date  . HERNIA REPAIR  20 years  . SMALL INTESTINE SURGERY     Per patient 7 years    Home Medications:  Allergies as of 10/10/2016   No Known Allergies     Medication List       Accurate as of 10/10/16 12:27 PM. Always use your most recent med list.          amantadine 100 MG capsule Commonly known as:  SYMMETREL Take 1 capsule (100 mg total) by mouth 3 (three) times daily.   aspirin 81 MG tablet Take 81 mg by mouth daily.   carbidopa-levodopa 25-100 MG tablet Commonly known as:  SINEMET IR take 2 tablets by mouth four times a day as directed   donepezil 10 MG disintegrating tablet Commonly known as:  ARICEPT ODT Take 1 tablet (10 mg total) by mouth at bedtime.   finasteride 5 MG tablet Commonly known as:  PROSCAR Take 1  tablet (5 mg total) by mouth daily.   furosemide 20 MG tablet Commonly known as:  LASIX Take 1 tablet (20 mg total) by mouth daily.   NUPLAZID 17 MG Tabs Generic drug:  Pimavanserin Tartrate Take 1 tablet by mouth daily.   OCUVITE PO Take by mouth.   sertraline 50 MG tablet Commonly known as:  ZOLOFT Take 1 tablet (50 mg total) by mouth daily.   STOOL SOFTENER 100 MG capsule Generic drug:  docusate sodium Take 100 mg by mouth 2 (two) times daily.   tamsulosin 0.4 MG Caps capsule Commonly known as:  FLOMAX Take 1 capsule (0.4 mg total) by mouth daily.   VITAMIN D3 PO Take by mouth.       Allergies: No Known Allergies  Family History: No family history on file.  Social History:  reports that he has never smoked. He has never used smokeless tobacco. He reports that he drinks alcohol. He reports that he does not use drugs.   Review of Systems  Gastrointestinal (upper)  : Negative for upper GI symptoms  Gastrointestinal (lower) : Negative for lower GI symptoms  Constitutional : Negative for symptoms  Skin: Negative for skin symptoms  Eyes: Negative for eye symptoms  Ear/Nose/Throat : Negative for Ear/Nose/Throat symptoms  Hematologic/Lymphatic: Negative for Hematologic/Lymphatic symptoms  Cardiovascular : Negative  for cardiovascular symptoms  Respiratory : Negative for respiratory symptoms  Endocrine: Negative for endocrine symptoms  Musculoskeletal: Negative for musculoskeletal symptoms  Neurological: Negative for neurological symptoms  Psychologic: Negative for psychiatric symptoms  Physical Exam: There were no vitals taken for this visit.  Constitutional:  Alert and oriented, No acute distress. Stigmata of advanced Parkinson's but Aox3.  HEENT: [place] AT, moist mucus membranes.  Trachea midline, no masses. Cardiovascular: No clubbing, cyanosis, or edema. Respiratory: Normal respiratory effort, no increased work of breathing. GI: Abdomen  is soft, nontender, nondistended, no abdominal masses GU: No CVA tenderness.  Skin: No rashes, bruises or suspicious lesions. Lymph: No cervical or inguinal adenopathy. Neurologic: Grossly intact, no focal deficits, moving all 4 extremities. Uses walker, shuffling gate.  Psychiatric: Normal mood and affect.   Laboratory Data: No results found for: WBC, HGB, HCT, MCV, PLT  No results found for: CREATININE  No results found for: PSA  No results found for: TESTOSTERONE  No results found for: HGBA1C  Urinalysis    Component Value Date/Time   BILIRUBINUR Negative 10/05/2016 1640   PROTEINUR Trace 10/05/2016 1640   UROBILINOGEN 0.2 10/05/2016 1640   NITRITE Negative 10/05/2016 1640   LEUKOCYTESUR Negative 10/05/2016 1640    Pertinent Imaging: none  Assessment & Plan:    1 - Gross Hematuria - discussed DDX benign and malignant etiologies and eval incluidng labs, exam, CT, Cysto with goal of ruling our worrisome causes.  BMP, PSA today.   CT and cysto on return.  2 - Lower Urinary Tract Symptoms - likley BPH exacerbated by CHF/lasix by history and exam. Continue current medical therapy, he is doing fairly well in this regard.   3 - Prostate Screening - PSA today as part of hematuria eval, would not eval furhter if <10 as age >80, though his exam is somewhat concerning.     Alexis Frock, [facility] [address] [place], [place] [address] [phone]

## 2016-10-11 LAB — PSA: Prostate Specific Ag, Serum: 3 ng/mL (ref 0.0–4.0)

## 2016-10-12 ENCOUNTER — Encounter: Payer: Self-pay | Admitting: Physical Therapy

## 2016-10-12 ENCOUNTER — Encounter: Payer: Self-pay | Admitting: Occupational Therapy

## 2016-10-12 ENCOUNTER — Ambulatory Visit: Payer: Medicare Other | Attending: Physician Assistant | Admitting: Physical Therapy

## 2016-10-12 ENCOUNTER — Ambulatory Visit: Payer: Medicare Other | Admitting: Occupational Therapy

## 2016-10-12 DIAGNOSIS — M6281 Muscle weakness (generalized): Secondary | ICD-10-CM | POA: Insufficient documentation

## 2016-10-12 DIAGNOSIS — R278 Other lack of coordination: Secondary | ICD-10-CM | POA: Diagnosis present

## 2016-10-12 DIAGNOSIS — R2681 Unsteadiness on feet: Secondary | ICD-10-CM | POA: Diagnosis not present

## 2016-10-12 DIAGNOSIS — G8929 Other chronic pain: Secondary | ICD-10-CM | POA: Insufficient documentation

## 2016-10-12 DIAGNOSIS — R293 Abnormal posture: Secondary | ICD-10-CM | POA: Insufficient documentation

## 2016-10-12 DIAGNOSIS — M25511 Pain in right shoulder: Secondary | ICD-10-CM

## 2016-10-12 NOTE — Patient Instructions (Addendum)
Turning in Place: Solid Surface    Standing in place,in front of counter with walker beside you. Work on turning 90 degree turn and then back to counter Do 5 turns each direction;  Take big steps towards the walker and then back towards the counter; Copyright  [employer]. All rights reserved.

## 2016-10-12 NOTE — Therapy (Signed)
[place] MAIN [facility] SERVICES [address] [place], [place], [address] Phone: [phone]   Fax:  [phone]  Physical Therapy Treatment/Progress Note  Patient Details  Name: Luke Durham MRN: [id number] Date of Birth: [birth year] Referring Provider: Marlyn Corporal  Encounter Date: 10/12/2016      PT End of Session - 10/12/16 1022    Visit Number 10   Number of Visits 25   Date for PT Re-Evaluation 12/07/16   Authorization Type G code 10   Authorization Time Period 10   PT Start Time 1015   PT Stop Time 1100   PT Time Calculation (min) 45 min   Equipment Utilized During Treatment Gait belt   Activity Tolerance Patient tolerated treatment well   Behavior During Therapy [facility] for tasks assessed/performed      Past Medical History:  Diagnosis Date  . Cancer ([place]) 7 years ago   lymphoma, [place] (recent)  . Colon polyps   . History of kidney stones     Past Surgical History:  Procedure Laterality Date  . HERNIA REPAIR  20 years  . SMALL INTESTINE SURGERY     Per patient 7 years    There were no vitals filed for this visit.      Subjective Assessment - 10/12/16 1020    Subjective Patient reports having a near syncope episode and having to go to the hospital; His BP was low and was possibly dehydrated. Patient and wife report that he has been doing a little better; He denies any new falls; He reports continued right shoulder pain and has a follow up visit with orthopedic MD in January;    Patient is accompained by: Family member   Pertinent History Factors affecting rehab: high co-pay, progressive disease, supportive family   Limitations Sitting;Standing;Walking   How long can you sit comfortably? 1 hour   How long can you stand comfortably? 20 min.   How long can you walk comfortably? level surface with RW, greater distance   Patient Stated Goals improve balance and flexibility   Currently in Pain? Yes   Pain Score 5    Pain  Location Shoulder   Pain Orientation Right   Pain Descriptors / Indicators Aching;Sore   Pain Type Chronic pain            [facility] PT Assessment - 10/12/16 0001      Strength   Right Shoulder Flexion 4/5   Right Shoulder Extension 4+/5   Right Shoulder ABduction 4-/5   Left Shoulder Flexion 4+/5   Left Shoulder Extension 4+/5   Left Shoulder ABduction 4/5   Right Elbow Flexion 4+/5   Right Elbow Extension 4+/5   Left Elbow Flexion 4+/5   Left Elbow Extension 4+/5   Right Hip Flexion 4-/5   Right Hip ABduction 4-/5   Right Hip ADduction 4/5   Left Hip Flexion 4-/5   Left Hip ABduction 4-/5   Left Hip ADduction 4/5   Right Knee Flexion 4-/5   Right Knee Extension 4/5   Left Knee Flexion 4-/5   Left Knee Extension 4/5   Right Ankle Dorsiflexion 4/5   Left Ankle Dorsiflexion 4/5     Standardized Balance Assessment   10 Meter Walk 1.0 m/s with RW (community ambulator, no change from 11/27 which was 1.0 m/s)     Timed Up and Go Test   Normal TUG (seconds) 67   TUG Comments with RW, min A and mod VCs for sequencing for walker positioning;  high risk for falls; slightly improved from 11/27 which was 84 sec; inconsistent with turning;         TREATMENT: Warm up on Nust[place] BUE/BLE level 2 x4 min (unbilled);  Instructed patient in 10 meter walk, timed up and go and assessed strength to determine progress towards goals. See above; Patient demonstrates significant challenge with turning requiring min A for advancing RW and to cues to increase step length;  Instructed patient in turning next to counter: 4, 90 degree turns for 360 degree turn x3 reps with max A moving RW and cues for increased step length for better turn ability; Facing counter, right 90 degree turn to walker and back to counter x5 reps with max VCs for hand placement and foot placement; Patient able to demonstrate better step length with visual cues for foot placement; He has difficulty with sequencing without  cues; Advanced HEP with 90 degree turns, see patient instructions;  Patient would benefit from additional skilled PT intervention to improve mobility;                      PT Education - 10/12/16 1234    Education provided Yes   Education Details progress towards goals, turning,gait safety;    Person(s) Educated Patient;Spouse   Methods Explanation;Verbal cues;Tactile cues;Demonstration   Comprehension Verbalized understanding;Returned demonstration;Verbal cues required;Tactile cues required;Need further instruction             PT Long Term Goals - 10/12/16 1023      PT LONG TERM GOAL #1   Title Patient will be independent in HEP in order to increase patient's ability to maintain gains achieved in therapy and assist with return to PLOF.    Time 8   Period Weeks   Status On-going     PT LONG TERM GOAL #2   Title Patient will decrease TUG test to <20 seconds in order to achieve an independently mobile status and decrease his risk for falls   Time 8   Period Weeks   Status Not Met     PT LONG TERM GOAL #3   Title Patient will increase overall strength to 4+/5 in both UE and LE in order to complete transfers safely with decreased risk for falls.    Time 8   Period Weeks   Status Partially Met     PT LONG TERM GOAL #4   Title Patient will increase his 10 m walk test time to >1.0 m/s in order to be a community ambulator at decreased risk for falls.    Time 8   Period Weeks   Status Partially Met               Plan - 10/12/16 1234    Clinical Impression Statement Patient instructed in outcome measures to assess progress towards goals. Patient demonstrates improved gait speed however has freezing and shuffled steps with turning leading to slower timed up and go. Patient would benefit from skilled PT intervention to improve turning and gait safety for better safety with ADLs. He would also benefit from additional strengthening to improve mobility;     Rehab Potential Fair   Clinical Impairments Affecting Rehab Potential Positive factors: motivated, family support, previous good responses to PT; Negative factors: high co-pay, progressive disease; Clinical Presentation: Evolving-multiple falls   PT Frequency 1x / week   PT Duration 8 weeks   PT Treatment/Interventions ADLs/Self Care Home Management;Aquatic Therapy;Electrical Stimulation;Moist Heat;DME Instruction;Gait training;Stair training;Functional mobility training;Therapeutic activities;Therapeutic exercise;Balance training;Neuromuscular  re-education;Patient/family education;Manual techniques;Energy conservation   PT Next Visit Plan continue to progress PWR program   PT Home Exercise Plan HEP progressed-seated PWR program supine PWR UP and Step and standing PWR except for twist; assess goals   Consulted and Agree with Plan of Care Patient;Family member/caregiver      Patient will benefit from skilled therapeutic intervention in order to improve the following deficits and impairments:  Abnormal gait, Decreased activity tolerance, Decreased balance, Decreased coordination, Decreased endurance, Decreased mobility, Decreased range of motion, Decreased safety awareness, Decreased strength, Hypomobility, Impaired flexibility, Improper body mechanics, Postural dysfunction  Visit Diagnosis: Unsteadiness on feet - Plan: PT plan of care cert/re-cert  Abnormal posture - Plan: PT plan of care cert/re-cert  Muscle weakness (generalized) - Plan: PT plan of care cert/re-cert       G-Codes - 11-08-2016 1238    Functional Assessment Tool Used timed up and go, 10 meter walk, transfers, clinical judgement   Functional Limitation Mobility: Walking and moving around   Mobility: Walking and Moving Around Current Status [phone]) At least 40 percent but less than 60 percent impaired, limited or restricted   Mobility: Walking and Moving Around Goal Status [phone]) At least 20 percent but less than 40 percent  impaired, limited or restricted      Problem List Patient Active Problem List   Diagnosis Date Noted  . Urinary frequency 10/10/2016  . Microscopic hematuria 10/10/2016  . Parkinson's syndrome ([place]) 06/14/2016  . Arthritis 06/14/2016  . Depression 06/14/2016  . Heartburn 06/14/2016  . Urinary incontinence 06/14/2016  . Skin lesion 06/14/2016  . Dementia 06/14/2016  . BPH (benign prostatic hyperplasia) 06/14/2016  . Bilateral edema of lower extremity 06/14/2016    Trotter,Margaret  PT, DPT 11-08-16, 12:40 PM  [place] MAIN [facility] SERVICES [address] [place], [place], [address] Phone: [phone]   Fax:  [phone]  Name: Norah Fick MRN: [id number] Date of Birth: Sep 02, 1933

## 2016-10-14 NOTE — Therapy (Signed)
[place] MAIN [facility] SERVICES [address] [place], [place], [address] Phone: [phone]   Fax:  [phone]  Occupational Therapy Evaluation  Patient Details  Name: Luke Durham MRN: [id number] Date of Birth: [birth year] Referring Provider: Marlyn Corporal  Encounter Date: 10/12/2016      OT End of Session - 10/14/16 1413    Visit Number 1   Number of Visits 12   Date for OT Re-Evaluation 01/04/17   Authorization Type Medicare visit 1   OT Start Time 1100   OT Stop Time 1155   OT Time Calculation (min) 55 min   Activity Tolerance Patient tolerated treatment well   Behavior During Therapy [facility] for tasks assessed/performed      Past Medical History:  Diagnosis Date  . Cancer ([place]) 7 years ago   lymphoma, [place] (recent)  . Colon polyps   . History of kidney stones     Past Surgical History:  Procedure Laterality Date  . HERNIA REPAIR  20 years  . SMALL INTESTINE SURGERY     Per patient 7 years    There were no vitals filed for this visit.      Subjective Assessment - 10/14/16 1400    Subjective  Patient reports it is difficult to get out several times a week for appointments. He is going soon for an appt with Dr. Roland Rack to check on the pain in his right shoulder.    Patient is accompained by: Family member   Pertinent History Patient reports he was diagnosed with Parkinson's disease about 10 plus years ago.  He reports a more recent decline in function in his daily activities in the last 6-12 months.  He has been seeing PT for the last couple months.   Patient Stated Goals Patient reports he wants to be able to do more for himself and around the house.    Currently in Pain? Yes   Pain Score 5    Pain Location Shoulder   Pain Orientation Right   Pain Descriptors / Indicators Aching   Pain Type Chronic pain   Pain Frequency Intermittent   Aggravating Factors  with shoulder flexion greater than 90 degrees            [facility] OT  Assessment - 10/14/16 1405      Assessment   Diagnosis Parkinson's disease   Referring Provider [place]   Onset Date 10/02/16   Prior Therapy PT     Precautions   Precautions Fall     Restrictions   Weight Bearing Restrictions No     Balance Screen   Has the patient fallen in the past 6 months Yes   How many times? 6   Has the patient had a decrease in activity level because of a fear of falling?  Yes   Is the patient reluctant to leave their home because of a fear of falling?  Yes     Home  Environment   Family/patient expects to be discharged to: Private residence   Living Arrangements Spouse/significant other   Available Help at Discharge Family   Type of Home --  condominum    Home Access Level entry   Home Layout One level   Bathroom Shower/Tub [place] - 2 wheels;Grab bars - toilet;Grab bars - tub/shower;Shower seat - built in;Hand held shower head  u step walker    Lives With [employer]  Prior Function   Level of Independence Needs assistance with ADLs   Vocation Retired     ADL   Eating/Feeding Minimal assistance   Eating/Feeding requires assistance to cut food, difficulty with managing spoon and fork, spills items frequently.   Grooming Teeth care   Upper Body Bathing Supervision/safety   Lower Body Bathing Modified independent   Upper Body Dressing Minimal assistance;Increased time;Needs assist for fasteners   Lower Body Dressing Minimal assistance   Toilet Tranfer Modified independent   Toilet Transer Equipment Comfort height toilet;Grab bars   Toileting -  Hygiene Minimal assistance;Increase time   Tub/Shower Transfer Supervision/safety   Transfers/Ambulation Related to ADL's u step in the house and around outside the house.  If going out, using folding walker.   ADL comments Patient requires assistance to cut his food, assist with shaving, buttons on  shirts and pants, can put socks on if they are loose, otherwise needs assist, assist to tie shoes, assistance to pull up pants when dressing and toileting.      IADL   Prior Level of Function Shopping will go with wife and push the cart.    Shopping Needs to be accompanied on any shopping trip   Prior Level of Function Light Housekeeping cleans the bathroom   Light Housekeeping Performs light daily tasks such as dishwashing, bed making   Prior Level of Function Meal Prep independent light meals.    Meal Prep Able to complete simple cold meal and snack prep   Medication Management Is not capable of dispensing or managing own medication   Prior Level of Function Financial Management independent   Financial Management Requires assistance     Mobility   Mobility Status History of falls;Freezing;Needs assist   Mobility Status Comments Patient ambulates with rolling walker when out in the community, has a U step walker at home but reports they do no use the laser feature very much since it doesn't always work.  He has had multiple falls in the last 6 months.      Written Expression   Dominant Hand Right   Handwriting Mild micrographia;Not legible     Vision - History   Baseline Vision Wears glasses all the time   Additional Comments Had cataract surgery in the past.     Cognition   Overall Cognitive Status Within Functional Limits for tasks assessed     Sensation   Light Touch Appears Intact   Stereognosis Appears Intact   Hot/Cold Appears Intact   Proprioception Appears Intact     Coordination   Gross Motor Movements are Fluid and Coordinated No   Fine Motor Movements are Fluid and Coordinated No   9 Hole Peg Test Right;Left   Right 9 Hole Peg Test 1 min 16 sec   Left 9 Hole Peg Test 1 min 10 sec.     AROM   Overall AROM Comments patient does complain of pain in right shoulder and has an appt soon to be evaluated by MD     Strength   Right Shoulder Flexion 3+/5   Right  Shoulder Extension 4/5   Right Shoulder ABduction 3-/5   Left Shoulder Flexion 4/5   Left Shoulder Extension 4/5   Left Shoulder ABduction 4/5   Right Elbow Flexion 4+/5   Right Elbow Extension 4+/5   Left Elbow Flexion 4+/5   Left Elbow Extension 4+/5     Hand Function   Right Hand Grip (lbs) 45   Left Hand Grip (lbs) 42  OT Education - 10/14/16 1413    Education provided Yes   Education Details role of OT, goals   Person(s) Educated Patient;Spouse   Methods Explanation   Comprehension Verbalized understanding             OT Long Term Goals - 10/14/16 1425      OT LONG TERM GOAL #1   Title Patient will demonstrate increase right hand grip to be able to cut food with modified independence.    Baseline unable at eval   Time 12   Period Weeks   Status New     OT LONG TERM GOAL #2   Title Patient will complete donning long sleeve shirt with modified independence.   Baseline requires assist with long sleeves, can perform short sleeves.   Time 12   Period Weeks   Status New     OT LONG TERM GOAL #3   Title Patient will improve coordination to manage buttons on clothing with occasional assistance only.   Baseline requires assist with buttons each time   Time 12   Period Weeks   Status New     OT LONG TERM GOAL #4   Title Patient will complete managing pants after toileting with modified independence.    Baseline assist most days    Time 12   Period Weeks   Status New     OT LONG TERM GOAL #5   Title Patient will improve hand strength bilaterally to be able to don socks with modified independence.    Baseline unable at evaluation   Time 12   Period Weeks   Status New               Plan - 10/14/16 1414    Clinical Impression Statement Patient is a 82 yo male with Parkinson's disease which has had a decline in function in the last 6-12 months, along with multiple falls.  He was recently hospitalized with  syncope and hematuria on 10/01/2016.  He was referred to OT for evaluation for outpatient services.  Upon evaluation, patient demonstrates decreased bilateral hand function, muscle weakness, impaired coordination of UEs for gross motor and fine motor tasks, pain in right shoulder with movement, recurrent falls, decreased balance, decreased amplitude of gait, and decreased ability to perform self care tasks.  He would benefit from skilled OT to focus on these areas to improve independence and safety in necessary daily tasks.  Patient is agreeable to therapy 1 time a week.  Discussed with patient and wife, as patient improves he would likely benefit from a more intensive therapy program of [employer] which is an intensive program designed specifically for Parkinson's patients with a focus on increasing amplitude and speed of movements, improving self-care and daily tasks and providing patients with daily exercises to improve overall function.    Rehab Potential Good   Clinical Impairments Affecting Rehab Potential positive: family support, negative: level of motivation, progressive disease   OT Frequency 1x / week   OT Duration 12 weeks   OT Treatment/Interventions Self-care/ADL training;Moist Heat;DME and/or AE instruction;Patient/family education;Therapeutic exercises;Balance training;Therapeutic exercise;Therapeutic activities;Neuromuscular education;Functional Mobility Training;Manual Therapy   Consulted and Agree with Plan of Care Patient;Family member/caregiver   Family Member Consulted wife      Patient will benefit from skilled therapeutic intervention in order to improve the following deficits and impairments:  Abnormal gait, Decreased coordination, Decreased range of motion, Difficulty walking, Decreased endurance, Decreased safety awareness, Decreased activity tolerance, Decreased balance, Impaired UE functional  use, Pain, Decreased mobility, Decreased strength  Visit Diagnosis: Muscle weakness  (generalized) - Plan: Ot plan of care cert/re-cert  Other lack of coordination - Plan: Ot plan of care cert/re-cert  Chronic right shoulder pain - Plan: Ot plan of care cert/re-cert      G-Codes - [id number]    Functional Assessment Tool Used clinical judgment, ADL assessment, strength testing, 9 hole peg test   Functional Limitation Self care   Self Care Current Status [id number]) At least 60 percent but less than 80 percent impaired, limited or restricted   Self Care Goal Status [id number]) At least 20 percent but less than 40 percent impaired, limited or restricted      Problem List Patient Active Problem List   Diagnosis Date Noted  . Urinary frequency 10/10/2016  . Microscopic hematuria 10/10/2016  . Parkinson's syndrome ([place]) 06/14/2016  . Arthritis 06/14/2016  . Depression 06/14/2016  . Heartburn 06/14/2016  . Urinary incontinence 06/14/2016  . Skin lesion 06/14/2016  . Dementia 06/14/2016  . BPH (benign prostatic hyperplasia) 06/14/2016  . Bilateral edema of lower extremity 06/14/2016   Achilles Dunk, OTR/L, CLT  Lovett,Amy 10/14/2016, 2:31 PM  [place] MAIN [facility] SERVICES [address] [place], [place], [address] Phone: [phone]   Fax:  [phone]  Name: Luke Durham MRN: [id number] Date of Birth: Aug 19, 1933

## 2016-10-19 ENCOUNTER — Encounter: Payer: Self-pay | Admitting: Physical Therapy

## 2016-10-19 ENCOUNTER — Ambulatory Visit: Payer: Medicare Other | Admitting: Occupational Therapy

## 2016-10-19 ENCOUNTER — Ambulatory Visit: Payer: Medicare Other | Admitting: Physical Therapy

## 2016-10-19 DIAGNOSIS — R278 Other lack of coordination: Secondary | ICD-10-CM

## 2016-10-19 DIAGNOSIS — M6281 Muscle weakness (generalized): Secondary | ICD-10-CM

## 2016-10-19 DIAGNOSIS — R2681 Unsteadiness on feet: Secondary | ICD-10-CM | POA: Diagnosis not present

## 2016-10-19 DIAGNOSIS — R293 Abnormal posture: Secondary | ICD-10-CM

## 2016-10-19 DIAGNOSIS — M25511 Pain in right shoulder: Secondary | ICD-10-CM

## 2016-10-19 DIAGNOSIS — G8929 Other chronic pain: Secondary | ICD-10-CM

## 2016-10-19 NOTE — Therapy (Signed)
[place] MAIN [facility] SERVICES [address] [place], [place], [address] Phone: [phone]   Fax:  [phone]  Physical Therapy Treatment  Patient Details  Name: Luke Durham MRN: [id number] Date of Birth: [birth year] Referring Provider: Marlyn Corporal  Encounter Date: 10/19/2016      PT End of Session - 10/19/16 1020    Visit Number 11   Number of Visits 25   Date for PT Re-Evaluation 12/07/16   Authorization Type G code 1   Authorization Time Period 10   PT Start Time 1018   PT Stop Time 1059   PT Time Calculation (min) 41 min   Equipment Utilized During Treatment Gait belt   Activity Tolerance Patient tolerated treatment well   Behavior During Therapy WFL for tasks assessed/performed      Past Medical History:  Diagnosis Date  . Cancer ([place]) 7 years ago   lymphoma, [place] (recent)  . Colon polyps   . History of kidney stones     Past Surgical History:  Procedure Laterality Date  . HERNIA REPAIR  20 years  . SMALL INTESTINE SURGERY     Per patient 7 years    There were no vitals filed for this visit.      Subjective Assessment - 10/19/16 1028    Subjective Pt reports he has been working on his turning but only completed his HEP 1x this past week.  He went walking 4x in the past week.  No new complaints or concerns.   Patient is accompained by: Family member  wife, [name]   Pertinent History Factors affecting rehab: high co-pay, progressive disease, supportive family   Limitations Sitting;Standing;Walking   How long can you sit comfortably? 1 hour   How long can you stand comfortably? 20 min.   How long can you walk comfortably? level surface with RW, greater distance   Patient Stated Goals improve balance and flexibility   Currently in Pain? No/denies       TREATMENT   Gait Training:  Ambulating x100 ft in gym at start of session with cues for upright posture, forward gaze, and bigger steps. Step length improves Bil  with proper positioning of RW. Pt has tendency to push it too far ahead.   Therapeutic Exercise:  PT instructed patient in PWR exercise:  Standing PWR 2x10 each, with close supervision and max VCs, clap with PWR twist and stomp with PWR step; Pt demonstrated poor weight shifting with PWR step and required cues to speed up movement. Pt forgets to open arms wide and requires cues to do so.  2x3 90 deg turns L and R with modA facilitation for turning.  Supine manual Bil HS stetch 2x30 seconds each side  Sideways stepping up and over 6" hurtle L and R x3 minutes                PT Education - 10/19/16 1020    Education provided Yes   Education Details Exercise technique; emphasized importance of completing HEP at least 4x/wk   Person(s) Educated Patient   Methods Explanation;Demonstration;Verbal cues   Comprehension Verbalized understanding;Returned demonstration;Need further instruction             PT Long Term Goals - 10/12/16 1023      PT LONG TERM GOAL #1   Title Patient will be independent in HEP in order to increase patient's ability to maintain gains achieved in therapy and assist with return to PLOF.    Time 8  Period Weeks   Status On-going     PT LONG TERM GOAL #2   Title Patient will decrease TUG test to <20 seconds in order to achieve an independently mobile status and decrease his risk for falls   Time 8   Period Weeks   Status Not Met     PT LONG TERM GOAL #3   Title Patient will increase overall strength to 4+/5 in both UE and LE in order to complete transfers safely with decreased risk for falls.    Time 8   Period Weeks   Status Partially Met     PT LONG TERM GOAL #4   Title Patient will increase his 10 m walk test time to >1.0 m/s in order to be a community ambulator at decreased risk for falls.    Time 8   Period Weeks   Status Partially Met               Plan - 10/19/16 1044    Clinical Impression Statement Pt demonstrates  difficulty with turning and requires facilitation at hips to accomplish 90 deg turns L and R.  He requires cues while ambulating for upright posture and proper management of RW with improved step length Bil.  He requested some stretching to BLEs at end of session due to reports of tightness. Will continue to focus on PWR exercises for improved mobility with ambulation and turning.   Rehab Potential Fair   Clinical Impairments Affecting Rehab Potential Positive factors: motivated, family support, previous good responses to PT; Negative factors: high co-pay, progressive disease; Clinical Presentation: Evolving-multiple falls   PT Frequency 1x / week   PT Duration 8 weeks   PT Treatment/Interventions ADLs/Self Care Home Management;Aquatic Therapy;Electrical Stimulation;Moist Heat;DME Instruction;Gait training;Stair training;Functional mobility training;Therapeutic activities;Therapeutic exercise;Balance training;Neuromuscular re-education;Patient/family education;Manual techniques;Energy conservation   PT Next Visit Plan continue to progress PWR program   PT Home Exercise Plan HEP progressed-seated PWR program supine PWR UP and Step and standing PWR except for twist; assess goals   Consulted and Agree with Plan of Care Patient;Family member/caregiver      Patient will benefit from skilled therapeutic intervention in order to improve the following deficits and impairments:  Abnormal gait, Decreased balance, Decreased endurance, Decreased knowledge of use of DME, Decreased mobility, Decreased range of motion, Decreased safety awareness, Decreased strength, Difficulty walking, Hypomobility, Increased fascial restricitons, Impaired perceived functional ability, Impaired flexibility, Improper body mechanics, Postural dysfunction  Visit Diagnosis: Unsteadiness on feet  Abnormal posture  Muscle weakness (generalized)     Problem List Patient Active Problem List   Diagnosis Date Noted  . Urinary  frequency 10/10/2016  . Microscopic hematuria 10/10/2016  . Parkinson's syndrome ([place]) 06/14/2016  . Arthritis 06/14/2016  . Depression 06/14/2016  . Heartburn 06/14/2016  . Urinary incontinence 06/14/2016  . Skin lesion 06/14/2016  . Dementia 06/14/2016  . BPH (benign prostatic hyperplasia) 06/14/2016  . Bilateral edema of lower extremity 06/14/2016    Collie Siad PT, DPT 10/19/2016, 11:04 AM  [place] MAIN [facility] SERVICES [address] [place], [place], [address] Phone: [phone]   Fax:  [phone]  Name: Luke Durham MRN: [id number] Date of Birth: May 03, 1933

## 2016-10-21 ENCOUNTER — Encounter: Payer: Self-pay | Admitting: Physician Assistant

## 2016-10-21 ENCOUNTER — Encounter: Payer: Self-pay | Admitting: Occupational Therapy

## 2016-10-21 NOTE — Therapy (Signed)
[place] MAIN [facility] SERVICES [address] [place], [place], [address] Phone: [phone]   Fax:  [phone]  Occupational Therapy Treatment  Patient Details  Name: Luke Durham MRN: [id number] Date of Birth: Feb 16, 1933 Referring Provider: Marlyn Corporal  Encounter Date: 10/19/2016      OT End of Session - 10/21/16 1800    Visit Number 2   Number of Visits 12   Date for OT Re-Evaluation 01/04/17   Authorization Type Medicare visit 2   OT Start Time 1100   OT Stop Time 1146   OT Time Calculation (min) 46 min   Activity Tolerance Patient tolerated treatment well   Behavior During Therapy [facility] for tasks assessed/performed      Past Medical History:  Diagnosis Date  . Cancer ([place]) 7 years ago   lymphoma, [place] (recent)  . Colon polyps   . History of kidney stones     Past Surgical History:  Procedure Laterality Date  . HERNIA REPAIR  20 years  . SMALL INTESTINE SURGERY     Per patient 7 years    There were no vitals filed for this visit.      Subjective Assessment - 10/21/16 1757    Subjective  Patient reports he is doing well, glad he could make it through both PT and OT today.     Patient is accompained by: Family member   Pertinent History Patient reports he was diagnosed with Parkinson's disease about 10 plus years ago.  He reports a more recent decline in function in his daily activities in the last 6-12 months.  He has been seeing PT for the last couple months.   Patient Stated Goals Patient reports he wants to be able to do more for himself and around the house.    Currently in Pain? No/denies   Pain Score 0-No pain                      OT Treatments/Exercises (OP) - 10/21/16 1758      ADLs   LB Dressing Patient was seen for lower body dressing techniques with use of adaptive equipment and instruction for modified techniques. Therapist demo and verbal/tactile cues provided.   Issued information to wife  about socks which are looser fit for patient, she is going to try to find locally.      Neurological Re-education Exercises   Other Exercises 1 Patient seen for grip strengthening exercises with 6.6# and 11# for 25 reps each hand, cues for technique.                  OT Education - 10/21/16 1759    Education provided Yes   Education Details adaptive equipment, sock aid, shoehorn and Engineer, building services) Educated Patient;Spouse   Methods Explanation;Demonstration;Verbal cues;Tactile cues   Comprehension Verbalized understanding;Returned demonstration;Verbal cues required;Tactile cues required             OT Long Term Goals - 10/14/16 1425      OT LONG TERM GOAL #1   Title Patient will demonstrate increase right hand grip to be able to cut food with modified independence.    Baseline unable at eval   Time 12   Period Weeks   Status New     OT LONG TERM GOAL #2   Title Patient will complete donning long sleeve shirt with modified independence.   Baseline requires assist with long sleeves, can perform short sleeves.   Time 12  Period Weeks   Status New     OT LONG TERM GOAL #3   Title Patient will improve coordination to manage buttons on clothing with occasional assistance only.   Baseline requires assist with buttons each time   Time 12   Period Weeks   Status New     OT LONG TERM GOAL #4   Title Patient will complete managing pants after toileting with modified independence.    Baseline assist most days    Time 12   Period Weeks   Status New     OT LONG TERM GOAL #5   Title Patient will improve hand strength bilaterally to be able to don socks with modified independence.    Baseline unable at evaluation   Time 12   Period Weeks   Status New               Plan - 10/21/16 1801    Clinical Impression Statement Patient has difficulty at home with donning socks especially those which are tighter, newer or worn once.  Patient instructed on use of  sock aid but had increased difficulty with use, modified technique to a cross legged method and he was able to perform with cues and multiple trials. May need to try a new pair of socks next session.  Issued information to wife regarding socks that are looser fit and easier to don and doff. Responds well to cues for functional mobility when moving about the clinic. Continue to work towards goals to increase independence in daily tasks.   Rehab Potential Good      Patient will benefit from skilled therapeutic intervention in order to improve the following deficits and impairments:  Abnormal gait, Decreased coordination, Decreased range of motion, Difficulty walking, Decreased endurance, Decreased safety awareness, Decreased activity tolerance, Decreased balance, Impaired UE functional use, Pain, Decreased mobility, Decreased strength  Visit Diagnosis: Muscle weakness (generalized)  Other lack of coordination  Chronic right shoulder pain    Problem List Patient Active Problem List   Diagnosis Date Noted  . Urinary frequency 10/10/2016  . Microscopic hematuria 10/10/2016  . Parkinson's syndrome ([place]) 06/14/2016  . Arthritis 06/14/2016  . Depression 06/14/2016  . Heartburn 06/14/2016  . Urinary incontinence 06/14/2016  . Skin lesion 06/14/2016  . Dementia 06/14/2016  . BPH (benign prostatic hyperplasia) 06/14/2016  . Bilateral edema of lower extremity 06/14/2016   Achilles Dunk, OTR/L, CLT  Nalany Steedley 10/21/2016, 6:06 PM  [place] MAIN [facility] SERVICES [address] [place], [place], [address] Phone: [phone]   Fax:  [phone]  Name: Luke Durham MRN: [id number] Date of Birth: 02-03-1933

## 2016-10-25 ENCOUNTER — Ambulatory Visit
Admission: RE | Admit: 2016-10-25 | Discharge: 2016-10-25 | Disposition: A | Discharge status: Home / Self Care | Payer: Medicare Other | Source: Ambulatory Visit | Attending: Urology | Admitting: Urology

## 2016-10-25 ENCOUNTER — Other Ambulatory Visit: Payer: Self-pay | Admitting: Urology

## 2016-10-25 DIAGNOSIS — I700 Atherosclerosis of aorta: Secondary | ICD-10-CM | POA: Insufficient documentation

## 2016-10-25 DIAGNOSIS — R3129 Other microscopic hematuria: Secondary | ICD-10-CM

## 2016-10-25 DIAGNOSIS — N200 Calculus of kidney: Secondary | ICD-10-CM | POA: Insufficient documentation

## 2016-10-25 DIAGNOSIS — N210 Calculus in bladder: Secondary | ICD-10-CM | POA: Insufficient documentation

## 2016-10-25 DIAGNOSIS — J439 Emphysema, unspecified: Secondary | ICD-10-CM | POA: Insufficient documentation

## 2016-10-25 DIAGNOSIS — N400 Enlarged prostate without lower urinary tract symptoms: Secondary | ICD-10-CM | POA: Insufficient documentation

## 2016-10-25 DIAGNOSIS — I2510 Atherosclerotic heart disease of native coronary artery without angina pectoris: Secondary | ICD-10-CM | POA: Insufficient documentation

## 2016-10-25 LAB — POCT I-STAT CREATININE: Creatinine, Ser: 0.8 mg/dL (ref 0.61–1.24)

## 2016-10-25 MED ORDER — IOPAMIDOL (ISOVUE-300) INJECTION 61%
125.0000 mL | Freq: Once | INTRAVENOUS | Status: AC | PRN
  Administered 2016-10-25: 125 mL via INTRAVENOUS

## 2016-10-26 ENCOUNTER — Ambulatory Visit: Payer: Medicare Other | Admitting: Physical Therapy

## 2016-10-27 ENCOUNTER — Ambulatory Visit: Payer: Medicare Other | Admitting: Physical Therapy

## 2016-10-27 ENCOUNTER — Encounter: Payer: Self-pay | Admitting: Physical Therapy

## 2016-10-27 ENCOUNTER — Encounter: Payer: Self-pay | Admitting: Occupational Therapy

## 2016-10-27 ENCOUNTER — Ambulatory Visit: Payer: Medicare Other | Attending: Physician Assistant | Admitting: Occupational Therapy

## 2016-10-27 DIAGNOSIS — R2681 Unsteadiness on feet: Secondary | ICD-10-CM | POA: Diagnosis present

## 2016-10-27 DIAGNOSIS — M6281 Muscle weakness (generalized): Secondary | ICD-10-CM | POA: Insufficient documentation

## 2016-10-27 DIAGNOSIS — R278 Other lack of coordination: Secondary | ICD-10-CM

## 2016-10-27 NOTE — Therapy (Signed)
[place] MAIN [facility] SERVICES [address] [place], [place], [address] Phone: [phone]   Fax:  [phone]  Physical Therapy Treatment  Patient Details  Name: Luke Durham MRN: [id number] Date of Birth: 1933/02/22 Referring Provider: Marlyn Corporal  Encounter Date: 10/27/2016      PT End of Session - 10/27/16 1053    Visit Number 12   Number of Visits 25   Date for PT Re-Evaluation 12/07/16   Authorization Type G code 2   Authorization Time Period 10   PT Start Time 1100   PT Stop Time 1145   PT Time Calculation (min) 45 min   Equipment Utilized During Treatment Gait belt   Activity Tolerance Patient tolerated treatment well   Behavior During Therapy [facility] for tasks assessed/performed      Past Medical History:  Diagnosis Date  . Cancer ([place]) 7 years ago   lymphoma, [place] (recent)  . Colon polyps   . History of kidney stones     Past Surgical History:  Procedure Laterality Date  . HERNIA REPAIR  20 years  . SMALL INTESTINE SURGERY     Per patient 7 years    There were no vitals filed for this visit.      Subjective Assessment - 10/27/16 1107    Subjective Patient reports that he is doing okay this morning. He reports falling last night for the first time in a few weeks. He reports he was walking with his walker but was carrying some mail and just fell. He denies any pain;    Patient is accompained by: Family member  wife, [name]   Pertinent History Factors affecting rehab: high co-pay, progressive disease, supportive family   Limitations Sitting;Standing;Walking   How long can you sit comfortably? 1 hour   How long can you stand comfortably? 20 min.   How long can you walk comfortably? level surface with RW, greater distance   Patient Stated Goals improve balance and flexibility   Currently in Pain? No/denies               TREATMENT: Warm up on Nustep BUE/BLE level 2 x4 min with cues to keep speed (steps per  minute) >50;  Forward/backward step over 1/2 bolster x10 with 2 rail assist and mod VCs to increase step length for better foot clearance; Side step over 1/2 bolster x5 each direction with 2-1 rail assist and mod VCs to increase step length particularly to left for better foot clearance;  Standing on 1/2 bolster (flat side up): Heel/toe raises x15 with rail assist, min VCs to avoid full body movement but to isolate ankle motion for better ankle stretch; BUE wand flexion x10 with CGA for safety;  Supine position on mat table x2 min with cues to relax shoulders and head for better stretch; Passive LE stretches: Hamstring stretch 10 sec hold x3 each LE; Calf stretch with hamstring stretch 10 sec hold x2 bilaterally; Piriformis stretch 15 sec hold x2 bilaterally; Knee to chest stretch 20 sec hold x2 bilaterally; Passive lumbar trunk rotation x10 bilaterally;  Hooklying; Bridges 2x10; Hip flexion march with red tband x10 bilaterally;  Patient required min A for supine<>sitting edge of table;  Patient slow with movement; He was able to demonstrate better posture and step length following mat exercise;                    PT Education - 10/27/16 1108    Education provided Yes   Education  Details gait safety, balance, strengthening   Person(s) Educated Patient   Methods Explanation;Verbal cues;Demonstration   Comprehension Verbalized understanding;Returned demonstration;Verbal cues required             PT Long Term Goals - 10/12/16 1023      PT LONG TERM GOAL #1   Title Patient will be independent in HEP in order to increase patient's ability to maintain gains achieved in therapy and assist with return to PLOF.    Time 8   Period Weeks   Status On-going     PT LONG TERM GOAL #2   Title Patient will decrease TUG test to <20 seconds in order to achieve an independently mobile status and decrease his risk for falls   Time 8   Period Weeks   Status Not Met      PT LONG TERM GOAL #3   Title Patient will increase overall strength to 4+/5 in both UE and LE in order to complete transfers safely with decreased risk for falls.    Time 8   Period Weeks   Status Partially Met     PT LONG TERM GOAL #4   Title Patient will increase his 10 m walk test time to >1.0 m/s in order to be a community ambulator at decreased risk for falls.    Time 8   Period Weeks   Status Partially Met               Plan - 10/27/16 1239    Clinical Impression Statement Patient instructed in advanced balance exercise. He was able to demonstrate better step length with rail assist; However patient continues to have forward flexed head and posture; PT instructed patient in LE stretches and supine position to improve postural flexibility; Patient reports feeling better after treatment session; He was able to stand up straighter following stretches; Patient would benefit from additional skilled PT intervention to improve step length and foot clearance and increase overall mobility;    Rehab Potential Fair   Clinical Impairments Affecting Rehab Potential Positive factors: motivated, family support, previous good responses to PT; Negative factors: high co-pay, progressive disease; Clinical Presentation: Evolving-multiple falls   PT Frequency 1x / week   PT Duration 8 weeks   PT Treatment/Interventions ADLs/Self Care Home Management;Aquatic Therapy;Electrical Stimulation;Moist Heat;DME Instruction;Gait training;Stair training;Functional mobility training;Therapeutic activities;Therapeutic exercise;Balance training;Neuromuscular re-education;Patient/family education;Manual techniques;Energy conservation   PT Next Visit Plan continue to progress PWR program   PT Home Exercise Plan HEP progressed-seated PWR program supine PWR UP and Step and standing PWR except for twist; assess goals   Consulted and Agree with Plan of Care Patient;Family member/caregiver      Patient will benefit  from skilled therapeutic intervention in order to improve the following deficits and impairments:  Abnormal gait, Decreased balance, Decreased endurance, Decreased knowledge of use of DME, Decreased mobility, Decreased range of motion, Decreased safety awareness, Decreased strength, Difficulty walking, Hypomobility, Increased fascial restricitons, Impaired perceived functional ability, Impaired flexibility, Improper body mechanics, Postural dysfunction  Visit Diagnosis: Muscle weakness (generalized)  Other lack of coordination  Unsteadiness on feet     Problem List Patient Active Problem List   Diagnosis Date Noted  . Urinary frequency 10/10/2016  . Microscopic hematuria 10/10/2016  . Parkinson's syndrome ([place]) 06/14/2016  . Arthritis 06/14/2016  . Depression 06/14/2016  . Heartburn 06/14/2016  . Urinary incontinence 06/14/2016  . Skin lesion 06/14/2016  . Dementia 06/14/2016  . BPH (benign prostatic hyperplasia) 06/14/2016  . Bilateral edema of lower  extremity 06/14/2016    Trotter,Margaret PT, DPT 10/27/2016, 12:41 PM  [place] MAIN [facility] SERVICES [address] [place], [place], [address] Phone: [phone]   Fax:  [phone]  Name: Xhaiden Coombs MRN: [id number] Date of Birth: 1933-05-05

## 2016-10-29 ENCOUNTER — Encounter: Payer: Self-pay | Admitting: Occupational Therapy

## 2016-10-29 NOTE — Therapy (Signed)
[place] MAIN [facility] SERVICES [address] [place], [place], [address] Phone: [phone]   Fax:  [phone]  Occupational Therapy Treatment  Patient Details  Name: Luke Durham MRN: [id number] Date of Birth: [birth year] Referring Provider: Marlyn Corporal  Encounter Date: 10/27/2016      OT End of Session - 10/29/16 1855    Visit Number 3   Number of Visits 12   Date for OT Re-Evaluation 01/04/17   Authorization Type Medicare visit 3   OT Start Time [id number]   OT Stop Time 1100   OT Time Calculation (min) 45 min   Activity Tolerance Patient tolerated treatment well   Behavior During Therapy [facility] for tasks assessed/performed      Past Medical History:  Diagnosis Date  . Cancer ([place]) 7 years ago   lymphoma, [place] (recent)  . Colon polyps   . History of kidney stones     Past Surgical History:  Procedure Laterality Date  . HERNIA REPAIR  20 years  . SMALL INTESTINE SURGERY     Per patient 7 years    There were no vitals filed for this visit.      Subjective Assessment - 10/29/16 1854    Subjective  Patient reports he was trying to hold the mail and manage the walker and fell sideways into the wall and ended up on the floor last night but denies any pain or discomfort today.     Pertinent History Patient reports he was diagnosed with Parkinson's disease about 10 plus years ago.  He reports a more recent decline in function in his daily activities in the last 6-12 months.  He has been seeing PT for the last couple months.   Patient Stated Goals Patient reports he wants to be able to do more for himself and around the house.    Currently in Pain? No/denies   Pain Score 0-No pain                      OT Treatments/Exercises (OP) - 10/29/16 1859      Fine Motor Coordination   Other Fine Motor Exercises manipulation of small 1/2 inch items with cues for prehension patterns to pick up and manipulate with right hand.      Neurological Re-education Exercises   Other Exercises 1 Patient seen for grip strengthening tasks 11# for 25 reps with cues for technique for sustained grip each hand.  Dowel exercises with 1# dowel for shoulder flexion, ABD/ADD, chest press, forwards/backwards circles, elbow flexion/ext for 10 reps for 2 sets, cues provided for form and technique.                  OT Education - 10/29/16 1854    Education provided Yes   Education Details fine motor coordination skills   Person(s) Educated Patient   Methods Explanation;Demonstration;Verbal cues   Comprehension Verbal cues required;Returned demonstration;Verbalized understanding             OT Long Term Goals - 10/14/16 1425      OT LONG TERM GOAL #1   Title Patient will demonstrate increase right hand grip to be able to cut food with modified independence.    Baseline unable at eval   Time 12   Period Weeks   Status New     OT LONG TERM GOAL #2   Title Patient will complete donning long sleeve shirt with modified independence.   Baseline requires assist with long sleeves,  can perform short sleeves.   Time 12   Period Weeks   Status New     OT LONG TERM GOAL #3   Title Patient will improve coordination to manage buttons on clothing with occasional assistance only.   Baseline requires assist with buttons each time   Time 12   Period Weeks   Status New     OT LONG TERM GOAL #4   Title Patient will complete managing pants after toileting with modified independence.    Baseline assist most days    Time 12   Period Weeks   Status New     OT LONG TERM GOAL #5   Title Patient will improve hand strength bilaterally to be able to don socks with modified independence.    Baseline unable at evaluation   Time 12   Period Weeks   Status New               Plan - 10/29/16 1855    Clinical Impression Statement Patient slow to complete tasks and requires cues for amplitude of movements with reaching, fine motor  coordination, and functional mobility during tasks. Patient responds well to cues but benefits from repeated cues during session for follow through. Continue to work towards goals.    Rehab Potential Good   Clinical Impairments Affecting Rehab Potential positive: family support, negative: level of motivation, progressive disease   OT Frequency 1x / week   OT Duration 12 weeks   OT Treatment/Interventions Self-care/ADL training;Moist Heat;DME and/or AE instruction;Patient/family education;Therapeutic exercises;Balance training;Therapeutic exercise;Therapeutic activities;Neuromuscular education;Functional Mobility Training;Manual Therapy   Consulted and Agree with Plan of Care Patient;Family member/caregiver   Family Member Consulted wife      Patient will benefit from skilled therapeutic intervention in order to improve the following deficits and impairments:  Abnormal gait, Decreased coordination, Decreased range of motion, Difficulty walking, Decreased endurance, Decreased safety awareness, Decreased activity tolerance, Decreased balance, Impaired UE functional use, Pain, Decreased mobility, Decreased strength  Visit Diagnosis: Muscle weakness (generalized)  Other lack of coordination    Problem List Patient Active Problem List   Diagnosis Date Noted  . Urinary frequency 10/10/2016  . Microscopic hematuria 10/10/2016  . Parkinson's syndrome ([place]) 06/14/2016  . Arthritis 06/14/2016  . Depression 06/14/2016  . Heartburn 06/14/2016  . Urinary incontinence 06/14/2016  . Skin lesion 06/14/2016  . Dementia 06/14/2016  . BPH (benign prostatic hyperplasia) 06/14/2016  . Bilateral edema of lower extremity 06/14/2016   Achilles Dunk, OTR/L, CLT  Vahan Wadsworth 10/29/2016, 7:03 PM  [place] MAIN [facility] SERVICES [address] [place], [place], [address] Phone: [phone]   Fax:  [phone]  Name: Alexavier Roquemore MRN: [id number] Date of Birth:  Feb 06, 1933

## 2016-11-01 ENCOUNTER — Encounter: Payer: Self-pay | Admitting: Urology

## 2016-11-01 ENCOUNTER — Ambulatory Visit (INDEPENDENT_AMBULATORY_CARE_PROVIDER_SITE_OTHER): Payer: Medicare Other | Admitting: Urology

## 2016-11-01 VITALS — BP 110/71 | HR 66 | Ht 66.0 in | Wt 169.9 lb

## 2016-11-01 DIAGNOSIS — R3129 Other microscopic hematuria: Secondary | ICD-10-CM | POA: Diagnosis not present

## 2016-11-01 LAB — MICROSCOPIC EXAMINATION

## 2016-11-01 LAB — URINALYSIS, COMPLETE
Bilirubin, UA: NEGATIVE
Glucose, UA: NEGATIVE
Nitrite, UA: NEGATIVE
RBC, UA: NEGATIVE
Specific Gravity, UA: >1.03 — ABNORMAL HIGH (ref 1.005–1.030)
Urobilinogen, Ur: 0.2 mg/dL (ref 0.2–1.0)
pH, UA: 5.5 (ref 5.0–7.5)

## 2016-11-01 MED ORDER — LIDOCAINE HCL 2 % EX GEL
1.0000 "application " | Freq: Once | CUTANEOUS | Status: AC
  Administered 2016-11-01: 1 via URETHRAL

## 2016-11-01 MED ORDER — CIPROFLOXACIN HCL 500 MG PO TABS
500.0000 mg | ORAL_TABLET | Freq: Once | ORAL | Status: AC
  Administered 2016-11-01: 500 mg via ORAL

## 2016-11-01 NOTE — Progress Notes (Signed)
83 year old male who was seen today in follow-up. He has a history of Parkinson's disease and was recently evaluated in an emergency [place] for syncope. As part of the workup he was noted to have hematuria. The patient [place] then [place], [place]. He has since undergone a CT scan, hematuria protocol, as well as a DRE and PSA. His DRE was abnormal but his PSA was 3. His CT scan demonstrated 3 bladder stones measuring up to 2.4 centimeters.  The patient has a history of bladder outlet obstruction and currently takes both finasteride and Flomax. He describes urinary frequency and urgency. He's had obstructive voiding symptoms for a long time. He denies a history of urinary tract infections. He has never had a Foley catheter.  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 He tolerated anesthesia without issue. The patient has no coronary or cardiac history. His syncopal evaluation was attributed to dehydration, and no significant abnormalities in his workup. He did have an echocardiogram at this time, the results are unavailable to me.  Current Outpatient Prescriptions on File Prior to Visit  Medication Sig Dispense Refill  . amantadine (SYMMETREL) 100 MG capsule Take 1 capsule (100 mg total) by mouth 3 (three) times daily. 270 capsule 3  . carbidopa-levodopa (SINEMET IR) 25-100 MG tablet take 2 tablets by mouth four times a day as directed 270 tablet 11  . docusate sodium (STOOL SOFTENER) 100 MG capsule Take 100 mg by mouth 2 (two) times daily.    Marland Kitchen donepezil (ARICEPT ODT) 10 MG disintegrating tablet Take 1 tablet (10 mg total) by mouth at bedtime. 90 tablet 3  . finasteride (PROSCAR) 5 MG tablet Take 1 tablet (5 mg total) by mouth daily. 90 tablet 3  . furosemide (LASIX) 20 MG tablet Take 1 tablet (20 mg total) by mouth daily. 90 tablet 3  . Multiple Vitamins-Minerals (OCUVITE PO) Take by mouth.    Marland Kitchen Pimavanserin Tartrate  (NUPLAZID) 17 MG TABS Take 1 tablet by mouth daily.    . sertraline (ZOLOFT) 50 MG tablet Take 1 tablet (50 mg total) by mouth daily. 90 tablet 3  . tamsulosin (FLOMAX) 0.4 MG CAPS capsule Take 1 capsule (0.4 mg total) by mouth daily. 90 capsule 3  . aspirin 81 MG tablet Take 81 mg by mouth daily.    . Cholecalciferol (VITAMIN D3 PO) Take by mouth.     No current facility-administered medications on file prior to visit.    Past Medical History:  Diagnosis Date  . Cancer ([place]) 7 years ago   lymphoma, [place] (recent)  . Colon polyps   . History of kidney stones    Past Surgical History:  Procedure Laterality Date  . HERNIA REPAIR  20 years  . SMALL INTESTINE SURGERY     Per patient 7 years   Vitals:   11/01/16 1048  BP: 110/71  Pulse: 66   NAD RRR CTA  No results for input(s): WBC, HGB, HCT in the last 72 hours. No results for input(s): NA, K, CL, CO2, GLUCOSE, BUN, CREATININE, CALCIUM in the last 72 hours. No results for input(s): LABPT, INR in the last 72 hours. No results for input(s): PSA in the last 72 hours. No results for input(s): LABURIN in the last 72 hours. Results for orders placed or performed in visit on 11/01/16  Microscopic Examination     Status: Abnormal   Collection Time: 11/01/16 10:40 AM  Result Value Ref Range  Status   WBC, UA 11-30 (A) 0 - 5 /hpf Final   RBC, UA 0-2 0 - 2 /hpf Final   Epithelial Cells (non renal) 0-10 0 - 10 /hpf Final   Mucus, UA Present (A) Not Estab. Final   Bacteria, UA Few None seen/Few Final    Imaging: I have independently reviewed this patient's CT scan which demonstrates a markedly enlarged prostate with 3 bladder stones measuring up to 2 and a half centimeters. There are no other remarkable findings.  Cystoscopy: The patient was prepped and draped in routine sterile fashion. I then inserted the cystoscope gently into the patient's urethral meatus under visual guidance after the patient had been prepped and lidocaine jelly  injected into his urethra. His urethra was noted to be normal. Upon entering the prostate noted is a significantly enlarged left lateral lobe causing severe obstruction as well as a high median bar. Within the bladder the bladder calculi were easily visualized. There are no other significant abnormality is noted a side from mild bladder trabeculation.   Impression: The patient has hematuria almost certainly from the bladder stones. He also has an obstructing prostate.  Impression/ Plan: We discussed treatment options for the patient. The treatment options R Alveta Heimlich with his significant comorbidities including severe Parkinson's disease. However, given the size of the stones, there only likely to grow and ultimately her risk for developing an infection that he is unable to clear. Further, he has a very large obstructing prostate, particularly the left lateral lobe. I think that it'll make sense to provide the patient a bladder outlet procedure at the time of take care of the stones. As such, we discussed cystolitholapaxy and TURP. I went over the procedure. The patient and his wife in detail. He understands that he will be admitted overnight for continuous bladder irrigation. We'll plan to work with Dr. Erlene Quan on getting this scheduled.

## 2016-11-02 ENCOUNTER — Other Ambulatory Visit: Payer: Self-pay | Admitting: Radiology

## 2016-11-02 ENCOUNTER — Telehealth: Payer: Self-pay | Admitting: Radiology

## 2016-11-02 DIAGNOSIS — N210 Calculus in bladder: Secondary | ICD-10-CM

## 2016-11-02 NOTE — Telephone Encounter (Signed)
Notified pt's wife of surgery scheduled with Dr Erlene Quan on 11/14/16, pre-admit testing appt on 11/04/16 @1 :00 & to call Friday prior to surgery for arrival time to SDS. Wife voices understanding.

## 2016-11-03 ENCOUNTER — Ambulatory Visit: Payer: Medicare Other | Admitting: Occupational Therapy

## 2016-11-03 ENCOUNTER — Ambulatory Visit: Payer: Medicare Other | Admitting: Physical Therapy

## 2016-11-04 DIAGNOSIS — G20 Parkinson's disease: Secondary | ICD-10-CM | POA: Diagnosis not present

## 2016-11-04 DIAGNOSIS — Z01818 Encounter for other preprocedural examination: Secondary | ICD-10-CM | POA: Diagnosis present

## 2016-11-04 LAB — BASIC METABOLIC PANEL
Anion gap: 6 (ref 5–15)
BUN: 28 mg/dL — ABNORMAL HIGH (ref 6–20)
CO2: 28 mmol/L (ref 22–32)
Calcium: 8.7 mg/dL — ABNORMAL LOW (ref 8.9–10.3)
Chloride: 104 mmol/L (ref 101–111)
Creatinine, Ser: 0.99 mg/dL (ref 0.61–1.24)
GFR calc Af Amer: >60 mL/min (ref >60)
GFR calc non Af Amer: >60 mL/min (ref >60)
Glucose, Bld: 96 mg/dL (ref 65–99)
Potassium: 3.9 mmol/L (ref 3.5–5.1)
Sodium: 138 mmol/L (ref 135–145)

## 2016-11-04 LAB — CBC
HCT: 36.4 % — ABNORMAL LOW (ref 40.0–52.0)
Hemoglobin: 12.6 g/dL — ABNORMAL LOW (ref 13.0–18.0)
MCH: 31 pg (ref 26.0–34.0)
MCHC: 34.7 g/dL (ref 32.0–36.0)
MCV: 89.5 fL (ref 80.0–100.0)
Platelets: 157 10*3/uL (ref 150–440)
RBC: 4.07 MIL/uL — ABNORMAL LOW (ref 4.40–5.90)
RDW: 14.5 % (ref 11.5–14.5)
WBC: 5.4 10*3/uL (ref 3.8–10.6)

## 2016-11-04 LAB — URINALYSIS, ROUTINE W REFLEX MICROSCOPIC
Bilirubin Urine: NEGATIVE
Glucose, UA: NEGATIVE mg/dL
Ketones, ur: 5 mg/dL — AB
Nitrite: NEGATIVE
Protein, ur: NEGATIVE mg/dL
Specific Gravity, Urine: 1.017 (ref 1.005–1.030)
pH: 5 (ref 5.0–8.0)

## 2016-11-04 LAB — TYPE AND SCREEN
ABO/RH(D): A POS
Antibody Screen: NEGATIVE

## 2016-11-04 NOTE — Patient Instructions (Signed)
  Your procedure is scheduled on: November 14, 2016 (Monday) Report to Same Day Surgery 2nd floor medical mall [facility] Entrance-take elevator on left to 2nd floor.  Check in with surgery information desk.) To find out your arrival time please call [phone] between 1PM - 3PM on November 11, 2016 (Friday)  Remember: Instructions that are not followed completely may result in serious medical risk, up to and including death, or upon the discretion of your surgeon and anesthesiologist your surgery may need to be rescheduled.    _x___ 1. Do not eat food or drink liquids after midnight. No gum chewing or hard candies.     __x__ 2. No Alcohol for 24 hours before or after surgery.   __x__3. No Smoking for 24 prior to surgery.   ____  4. Bring all medications with you on the day of surgery if instructed.    __x__ 5. Notify your doctor if there is any change in your medical condition     (cold, fever, infections).     Do not wear jewelry, make-up, hairpins, clips or nail polish.  Do not wear lotions, powders, or perfumes. You may wear deodorant.  Do not shave 48 hours prior to surgery. Men may shave face and neck.  Do not bring valuables to the hospital.    [facility] is not responsible for any belongings or valuables.               Contacts, dentures or bridgework may not be worn into surgery.  Leave your suitcase in the car. After surgery it may be brought to your room.  For patients admitted to the hospital, discharge time is determined by your treatment team.   Patients discharged the day of surgery will not be allowed to drive home.  You will need someone to drive you home and stay with you the night of your procedure.    Please read over the following fact sheets that you were given:   [facility] Preparing for Surgery and or MRSA Information   _x___ Take these medicines the morning of surgery with A SIP OF WATER:    1. Amantadine  2. Carbidopa-Levodopa  3. Nuplazid  4.  Zoloft  5.  6.  ____Fleets enema or Magnesium Citrate as directed.   ___ Use CHG Soap or sage wipes as directed on instruction sheet   ____ Use inhalers on the day of surgery and bring to hospital day of surgery  ____ Stop metformin 2 days prior to surgery    ____ Take 1/2 of usual insulin dose the night before surgery and none on the morning of           surgery.   ____ Stop Aspirin, Coumadin, Pllavix ,Eliquis, Effient, or Pradaxa  x__ Stop Anti-inflammatories such as Advil, Aleve, Ibuprofen, Motrin, Naproxen,          Naprosyn, Goodies powders or aspirin products. Ok to take Tylenol.   ____ Stop supplements until after surgery.    ____ Bring C-Pap to the hospital.

## 2016-11-05 LAB — CULTURE, URINE COMPREHENSIVE

## 2016-11-05 NOTE — Pre-Procedure Instructions (Signed)
UA, Met B, and CBC sent to Dr. Erlene Quan for review.  Met B and CBC sent to Anesthesia for review.

## 2016-11-07 ENCOUNTER — Encounter
Admission: RE | Admit: 2016-11-07 | Discharge: 2016-11-07 | Disposition: A | Discharge status: Home / Self Care | Payer: Medicare Other | Source: Ambulatory Visit | Attending: Urology | Admitting: Urology

## 2016-11-07 DIAGNOSIS — Z01818 Encounter for other preprocedural examination: Secondary | ICD-10-CM | POA: Diagnosis not present

## 2016-11-07 DIAGNOSIS — G20 Parkinson's disease: Secondary | ICD-10-CM | POA: Insufficient documentation

## 2016-11-07 HISTORY — DX: Parkinson's disease: G20

## 2016-11-07 HISTORY — DX: Gastro-esophageal reflux disease without esophagitis: K21.9

## 2016-11-07 HISTORY — DX: Chronic kidney disease, unspecified: N18.9

## 2016-11-07 HISTORY — DX: Anxiety disorder, unspecified: F41.9

## 2016-11-07 HISTORY — DX: Unspecified osteoarthritis, unspecified site: M19.90

## 2016-11-07 HISTORY — DX: Parkinson's disease without dyskinesia, without mention of fluctuations: G20.A1

## 2016-11-07 HISTORY — DX: Unspecified asthma, uncomplicated: J45.909

## 2016-11-07 NOTE — Pre-Procedure Instructions (Signed)
EKG's sent to Anesthesia for review.

## 2016-11-08 ENCOUNTER — Ambulatory Visit: Payer: Medicare Other | Admitting: Occupational Therapy

## 2016-11-08 ENCOUNTER — Encounter: Payer: Self-pay | Admitting: Physical Therapy

## 2016-11-08 ENCOUNTER — Ambulatory Visit: Payer: Medicare Other | Admitting: Physical Therapy

## 2016-11-08 DIAGNOSIS — M6281 Muscle weakness (generalized): Secondary | ICD-10-CM

## 2016-11-08 DIAGNOSIS — R278 Other lack of coordination: Secondary | ICD-10-CM

## 2016-11-08 DIAGNOSIS — R2681 Unsteadiness on feet: Secondary | ICD-10-CM

## 2016-11-08 NOTE — Therapy (Signed)
[place] MAIN [facility] SERVICES [address] [place], [place], [address] Phone: [phone]   Fax:  [phone]  Physical Therapy Treatment  Patient Details  Name: Luke Durham MRN: [id number] Date of Birth: 01/15/1933 Referring Provider: Marlyn Corporal  Encounter Date: 11/08/2016      PT End of Session - 11/08/16 1436    Visit Number 13   Number of Visits 25   Date for PT Re-Evaluation 12/07/16   Authorization Type G code 3   Authorization Time Period 10   PT Start Time 1350   PT Stop Time 1430   PT Time Calculation (min) 40 min   Equipment Utilized During Treatment Gait belt   Activity Tolerance Patient tolerated treatment well   Behavior During Therapy [facility] for tasks assessed/performed      Past Medical History:  Diagnosis Date  . Anxiety   . Arthritis   . Asthma    childhood asthma  . Cancer ([place]) 7 years ago   lymphoma, [place] (recent)  . Chronic kidney disease   . Colon polyps   . GERD (gastroesophageal reflux disease)   . History of kidney stones   . Parkinson disease [facility])     Past Surgical History:  Procedure Laterality Date  . COLON SURGERY    . EYE SURGERY Bilateral    Cataract Extraction with IOL  . HERNIA REPAIR  20 years  . MOHS SURGERY    . SMALL INTESTINE SURGERY     Per patient 7 years    There were no vitals filed for this visit.      Subjective Assessment - 11/08/16 1359    Subjective Patient reports getting steriod injection from Dr. Roland Rack in right shoulder yesterday; He is supposed to get referral for shoulder pain; Patient reports that he is supposed to have surgery on 11/14/16 to remove bladder stones; He will need to spend the night on Monday; He reports having usual shoulder pain, no new changes;    Patient is accompained by: Family member  wife, [name]   Pertinent History Factors affecting rehab: high co-pay, progressive disease, supportive family   Limitations Sitting;Standing;Walking   How long  can you sit comfortably? 1 hour   How long can you stand comfortably? 20 min.   How long can you walk comfortably? level surface with RW, greater distance   Patient Stated Goals improve balance and flexibility   Currently in Pain? Yes   Pain Score 5    Pain Location Shoulder   Pain Orientation Right   Pain Descriptors / Indicators Aching;Sore   Pain Type Chronic pain      TREATMENT: Warm up on Nustep BUE/BLE level 2 x4 min with cues to keep speed (steps per minute) >50;  Patient able to transition sit<>Supine supervision with cues for foot and hand positioning;   Supine position on mat table x2 min with cues to relax shoulders and head for better stretch; Passive LE stretches: Hamstring stretch 10 sec hold x3 each LE; Calf stretch with hamstring stretch 10 sec hold x2 bilaterally; Piriformis stretch 15 sec hold x2 bilaterally; Knee to chest stretch 20 sec hold x2 bilaterally; Hooklying; Bridges 2x15;  Hip flexion march with red tband x10 bilaterally;  Sidelying: Clamshells x10 bilaterally with red tband resistance; Patient required min VCs and tactile cues for correct positioning to improve hip abductor strengthening;   Gait around gym 80 feet with CGA to close supervision using RW with min VCs to increase erect posture, increase step  length and to improve walker position for safety; Patient instructed to adjust RW and then step inside when making turns for better step length;   Patient slow with movement; He was able to demonstrate better posture and step length following mat exercise;                             PT Education - 11/08/16 1436    Education provided Yes   Education Details strengthening, stretches, gait safety;    Person(s) Educated Patient   Methods Explanation;Verbal cues   Comprehension Verbalized understanding;Returned demonstration;Verbal cues required             PT Long Term Goals - 10/12/16 1023      PT LONG TERM  GOAL #1   Title Patient will be independent in HEP in order to increase patient's ability to maintain gains achieved in therapy and assist with return to PLOF.    Time 8   Period Weeks   Status On-going     PT LONG TERM GOAL #2   Title Patient will decrease TUG test to <20 seconds in order to achieve an independently mobile status and decrease his risk for falls   Time 8   Period Weeks   Status Not Met     PT LONG TERM GOAL #3   Title Patient will increase overall strength to 4+/5 in both UE and LE in order to complete transfers safely with decreased risk for falls.    Time 8   Period Weeks   Status Partially Met     PT LONG TERM GOAL #4   Title Patient will increase his 10 m walk test time to >1.0 m/s in order to be a community ambulator at decreased risk for falls.    Time 8   Period Weeks   Status Partially Met               Plan - 11/08/16 1436    Clinical Impression Statement Patient reports compliance with HEP with doing more supine stretches; Instructed patient in LE strengthening to improve hip mobility; Patient able to tranition sit<>supine with supervision with cues for hand/foot placement. He did require cues for better ROM and to improve posture to faciliate increased strength; Patient would benefit from additional skilled PT intervention to improve strength, mobility and gait safety; He is supposed to have surgery next week to remove bladder stones. Informed patient will need MD note to return to therapy for safety; Patient agreeable.    Rehab Potential Fair   Clinical Impairments Affecting Rehab Potential Positive factors: motivated, family support, previous good responses to PT; Negative factors: high co-pay, progressive disease; Clinical Presentation: Evolving-multiple falls   PT Frequency 1x / week   PT Duration 8 weeks   PT Treatment/Interventions ADLs/Self Care Home Management;Aquatic Therapy;Electrical Stimulation;Moist Heat;DME Instruction;Gait  training;Stair training;Functional mobility training;Therapeutic activities;Therapeutic exercise;Balance training;Neuromuscular re-education;Patient/family education;Manual techniques;Energy conservation   PT Next Visit Plan continue to progress PWR program   PT Home Exercise Plan HEP progressed-seated PWR program supine PWR UP and Step and standing PWR except for twist; assess goals   Consulted and Agree with Plan of Care Patient;Family member/caregiver      Patient will benefit from skilled therapeutic intervention in order to improve the following deficits and impairments:  Abnormal gait, Decreased balance, Decreased endurance, Decreased knowledge of use of DME, Decreased mobility, Decreased range of motion, Decreased safety awareness, Decreased strength, Difficulty walking, Hypomobility, Increased  fascial restricitons, Impaired perceived functional ability, Impaired flexibility, Improper body mechanics, Postural dysfunction  Visit Diagnosis: Muscle weakness (generalized)  Other lack of coordination  Unsteadiness on feet     Problem List Patient Active Problem List   Diagnosis Date Noted  . Urinary frequency 10/10/2016  . Microscopic hematuria 10/10/2016  . Parkinson's syndrome ([place]) 06/14/2016  . Arthritis 06/14/2016  . Depression 06/14/2016  . Heartburn 06/14/2016  . Urinary incontinence 06/14/2016  . Skin lesion 06/14/2016  . Dementia 06/14/2016  . BPH (benign prostatic hyperplasia) 06/14/2016  . Bilateral edema of lower extremity 06/14/2016    Trotter,Margaret PT,DPT 11/08/2016, 2:38 PM  [place] MAIN [facility] SERVICES [address] [place], [place], [address] Phone: [phone]   Fax:  [phone]  Name: Luke Durham MRN: [id number] Date of Birth: January 12, 1933

## 2016-11-08 NOTE — Pre-Procedure Instructions (Addendum)
EKG 11/07/16 COMPARED WITH EKG 09/16/16 AND 1ST DEGREE BLOCK PRESENT. URINE CULTURE CALLED AND FAXED TO [facility]

## 2016-11-08 NOTE — Pre-Procedure Instructions (Signed)
CLEARED MODERATE RISK BY J BYRNETT PAC ON CHART

## 2016-11-09 ENCOUNTER — Encounter: Payer: Self-pay | Admitting: Occupational Therapy

## 2016-11-09 ENCOUNTER — Ambulatory Visit: Payer: Medicare Other | Admitting: Physical Therapy

## 2016-11-09 ENCOUNTER — Encounter: Payer: Medicare Other | Admitting: Occupational Therapy

## 2016-11-09 NOTE — Therapy (Signed)
[place] MAIN [facility] SERVICES [address] [place], [place], [address] Phone: [phone]   Fax:  [phone]  Occupational Therapy Treatment  Patient Details  Name: Luke Durham MRN: [id number] Date of Birth: 03-Apr-1933 Referring Provider: Marlyn Corporal  Encounter Date: 11/08/2016      OT End of Session - 11/09/16 1715    Visit Number 4   Number of Visits 12   Date for OT Re-Evaluation 01/04/17   Authorization Type Medicare visit 4   OT Start Time 1300   OT Stop Time 1345   OT Time Calculation (min) 45 min   Activity Tolerance Patient tolerated treatment well   Behavior During Therapy [facility] for tasks assessed/performed      Past Medical History:  Diagnosis Date  . Anxiety   . Arthritis   . Asthma    childhood asthma  . Cancer ([place]) 7 years ago   lymphoma, [place] (recent)  . Chronic kidney disease   . Colon polyps   . GERD (gastroesophageal reflux disease)   . History of kidney stones   . Parkinson disease [facility])     Past Surgical History:  Procedure Laterality Date  . COLON SURGERY    . EYE SURGERY Bilateral    Cataract Extraction with IOL  . HERNIA REPAIR  20 years  . MOHS SURGERY    . SMALL INTESTINE SURGERY     Per patient 7 years    There were no vitals filed for this visit.      Subjective Assessment - 11/09/16 1714    Subjective  Patient reports he went to the ortho MD about his shoulder on the right, has arthritis and possible rotator cuff tendinitis. Patient had to cancel PT appt for tomorrow due to impending snow.  Was able to get PT session scheduled after todays OT session.    Patient is accompained by: Family member   Pertinent History Patient reports he was diagnosed with Parkinson's disease about 10 plus years ago.  He reports a more recent decline in function in his daily activities in the last 6-12 months.  He has been seeing PT for the last couple months.   Patient Stated Goals Patient reports he wants to  be able to do more for himself and around the house.    Currently in Pain? Yes   Pain Score 5    Pain Location Shoulder   Pain Orientation Right   Pain Descriptors / Indicators Aching;Sore   Pain Type Chronic pain                      OT Treatments/Exercises (OP) - 11/09/16 1719      ADLs   Writing Patient seen this date for focus on handwriting skills with right hand, printing and writing name.  Instructed on use of large scale hand flick exercises prior to writing each time, letter formation larger in scale when performing this and responding to therapist cues.  Wife impressed with performance on this today and reports she has not seen his handwriting look as good in years.      Fine Motor Coordination   Other Fine Motor Exercises Manipulation of [place] discs with right hand with cues for flipping and turning using isolated finger movements.      Neurological Re-education Exercises   Other Exercises 1 Patient seen for exercises for range of motion at tabletop for forwards shoulder flexion, ABD/ADD.  1# dowel exercises for shoulder flexion to 90 degrees,  ABD/ADD,chest press, forwards and backwards circles, elbow flexion for 10 reps for 2 sets each exercise with cues.                 OT Education - 11/09/16 1715    Education provided Yes   Education Details ROM exercises for home   Person(s) Educated Patient;Spouse   Methods Explanation;Demonstration;Verbal cues   Comprehension Verbal cues required;Returned demonstration;Verbalized understanding             OT Long Term Goals - 11/09/16 1738      OT LONG TERM GOAL #1   Title Patient will demonstrate increase right hand grip to be able to cut food with modified independence.    Baseline unable at eval   Time 12   Period Weeks   Status On-going     OT LONG TERM GOAL #2   Title Patient will complete donning long sleeve shirt with modified independence.   Baseline requires assist with long sleeves,  can perform short sleeves.   Time 12   Period Weeks   Status On-going     OT LONG TERM GOAL #3   Title Patient will improve coordination to manage buttons on clothing with occasional assistance only.   Baseline requires assist with buttons each time   Time 12   Period Weeks   Status On-going     OT LONG TERM GOAL #4   Title Patient will complete managing pants after toileting with modified independence.    Baseline assist most days    Time 12   Period Weeks   Status On-going     OT LONG TERM GOAL #5   Title Patient will improve hand strength bilaterally to be able to don socks with modified independence.    Baseline unable at evaluation   Time 12   Period Weeks   Status On-going               Plan - 11/09/16 1716    Clinical Impression Statement Patient responds well to use of hand flicks with handwriting tasks, verbal cues for larger scale hand movements, to help produce larger letter formation for legibility with handwriting.  He produces improved legibility with printing name than script.  Continue to work towards goals to improve BUE use to impact self care tasks.    Rehab Potential Good   Clinical Impairments Affecting Rehab Potential positive: family support, negative: level of motivation, progressive disease   OT Frequency 1x / week   OT Duration 12 weeks   OT Treatment/Interventions Self-care/ADL training;Moist Heat;DME and/or AE instruction;Patient/family education;Therapeutic exercises;Balance training;Therapeutic exercise;Therapeutic activities;Neuromuscular education;Functional Mobility Training;Manual Therapy   Consulted and Agree with Plan of Care Patient;Family member/caregiver   Family Member Consulted wife      Patient will benefit from skilled therapeutic intervention in order to improve the following deficits and impairments:  Abnormal gait, Decreased coordination, Decreased range of motion, Difficulty walking, Decreased endurance, Decreased safety  awareness, Decreased activity tolerance, Decreased balance, Impaired UE functional use, Pain, Decreased mobility, Decreased strength  Visit Diagnosis: Muscle weakness (generalized)  Other lack of coordination    Problem List Patient Active Problem List   Diagnosis Date Noted  . Urinary frequency 10/10/2016  . Microscopic hematuria 10/10/2016  . Parkinson's syndrome ([place]) 06/14/2016  . Arthritis 06/14/2016  . Depression 06/14/2016  . Heartburn 06/14/2016  . Urinary incontinence 06/14/2016  . Skin lesion 06/14/2016  . Dementia 06/14/2016  . BPH (benign prostatic hyperplasia) 06/14/2016  . Bilateral edema of lower extremity  06/14/2016  Amy T Tomasita Morrow, OTR/L, CLT   Lovett,Amy 11/09/2016, 5:38 PM  [place] MAIN [facility] SERVICES [address] [place], [place], [address] Phone: [phone]   Fax:  [phone]  Name: Olumide Almonte MRN: [id number] Date of Birth: 05/19/1933

## 2016-11-11 ENCOUNTER — Other Ambulatory Visit: Payer: Self-pay

## 2016-11-11 ENCOUNTER — Telehealth: Payer: Self-pay | Admitting: Radiology

## 2016-11-11 DIAGNOSIS — N210 Calculus in bladder: Secondary | ICD-10-CM

## 2016-11-11 MED ORDER — CEPHALEXIN 500 MG PO CAPS: 500.0000 mg | ORAL_CAPSULE | Freq: Three times a day (TID) | ORAL | 0 refills | Status: DC

## 2016-11-11 NOTE — Telephone Encounter (Signed)
Notified pt's wife of positive ucx & script sent to pharmacy. Per Dr Erlene Quan, as long as pt starts medication this morning we will proceed with surgery scheduled 11/14/16. Wife voices understanding.

## 2016-11-14 ENCOUNTER — Encounter: Payer: Self-pay | Admitting: Anesthesiology

## 2016-11-14 ENCOUNTER — Ambulatory Visit: Payer: Medicare Other | Admitting: Anesthesiology

## 2016-11-14 ENCOUNTER — Observation Stay
Admission: RE | Admit: 2016-11-14 | Discharge: 2016-11-15 | Disposition: A | Discharge status: Home / Self Care | Payer: Medicare Other | Source: Ambulatory Visit | Attending: Urology | Admitting: Urology

## 2016-11-14 ENCOUNTER — Encounter
Admission: RE | Disposition: A | Discharge status: Home / Self Care | Payer: Self-pay | Source: Ambulatory Visit | Attending: Urology

## 2016-11-14 DIAGNOSIS — G20 Parkinson's disease: Secondary | ICD-10-CM | POA: Diagnosis not present

## 2016-11-14 DIAGNOSIS — Z7982 Long term (current) use of aspirin: Secondary | ICD-10-CM | POA: Insufficient documentation

## 2016-11-14 DIAGNOSIS — F329 Major depressive disorder, single episode, unspecified: Secondary | ICD-10-CM | POA: Diagnosis not present

## 2016-11-14 DIAGNOSIS — N210 Calculus in bladder: Secondary | ICD-10-CM | POA: Diagnosis not present

## 2016-11-14 DIAGNOSIS — Z85828 Personal history of other malignant neoplasm of skin: Secondary | ICD-10-CM | POA: Diagnosis not present

## 2016-11-14 DIAGNOSIS — F0280 Dementia in other diseases classified elsewhere without behavioral disturbance: Secondary | ICD-10-CM | POA: Insufficient documentation

## 2016-11-14 DIAGNOSIS — Z87442 Personal history of urinary calculi: Secondary | ICD-10-CM | POA: Diagnosis not present

## 2016-11-14 DIAGNOSIS — Z8572 Personal history of non-Hodgkin lymphomas: Secondary | ICD-10-CM | POA: Insufficient documentation

## 2016-11-14 DIAGNOSIS — N401 Enlarged prostate with lower urinary tract symptoms: Secondary | ICD-10-CM | POA: Diagnosis present

## 2016-11-14 DIAGNOSIS — N138 Other obstructive and reflux uropathy: Secondary | ICD-10-CM | POA: Diagnosis present

## 2016-11-14 DIAGNOSIS — N320 Bladder-neck obstruction: Secondary | ICD-10-CM | POA: Diagnosis not present

## 2016-11-14 DIAGNOSIS — Z79899 Other long term (current) drug therapy: Secondary | ICD-10-CM | POA: Diagnosis not present

## 2016-11-14 HISTORY — PX: CYSTOSCOPY WITH LITHOLAPAXY: SHX1425

## 2016-11-14 HISTORY — PX: TRANSURETHRAL RESECTION OF PROSTATE: SHX73

## 2016-11-14 LAB — ABO/RH: ABO/RH(D): A POS

## 2016-11-14 SURGERY — CYSTOSCOPY, WITH BLADDER CALCULUS LITHOLAPAXY
Anesthesia: General | Wound class: Clean Contaminated

## 2016-11-14 MED ORDER — EPHEDRINE SULFATE 50 MG/ML IJ SOLN
INTRAMUSCULAR | Status: DC | PRN
  Administered 2016-11-14 (×2): 10 mg via INTRAVENOUS

## 2016-11-14 MED ORDER — FENTANYL CITRATE (PF) 100 MCG/2ML IJ SOLN
INTRAMUSCULAR | Status: DC | PRN
  Administered 2016-11-14: 50 ug via INTRAVENOUS

## 2016-11-14 MED ORDER — EPHEDRINE 5 MG/ML INJ
INTRAVENOUS | Status: AC
  Filled 2016-11-14: qty 10

## 2016-11-14 MED ORDER — DEXAMETHASONE SODIUM PHOSPHATE 10 MG/ML IJ SOLN
INTRAMUSCULAR | Status: AC
  Filled 2016-11-14: qty 1

## 2016-11-14 MED ORDER — POLYVINYL ALCOHOL 1.4 % OP SOLN
1.0000 [drp] | Freq: Every day | OPHTHALMIC | Status: DC
  Administered 2016-11-14: 1 [drp] via OPHTHALMIC
  Filled 2016-11-14: qty 15

## 2016-11-14 MED ORDER — SODIUM CHLORIDE 0.9 % IV SOLN
INTRAVENOUS | Status: DC
  Administered 2016-11-14 – 2016-11-15 (×2): via INTRAVENOUS

## 2016-11-14 MED ORDER — FENTANYL CITRATE (PF) 100 MCG/2ML IJ SOLN
25.0000 ug | INTRAMUSCULAR | Status: AC | PRN
  Administered 2016-11-14 (×6): 25 ug via INTRAVENOUS

## 2016-11-14 MED ORDER — LIDOCAINE 2% (20 MG/ML) 5 ML SYRINGE
INTRAMUSCULAR | Status: AC
  Filled 2016-11-14: qty 5

## 2016-11-14 MED ORDER — AMANTADINE HCL 100 MG PO CAPS
100.0000 mg | ORAL_CAPSULE | Freq: Every day | ORAL | Status: DC
  Administered 2016-11-14: 100 mg via ORAL
  Filled 2016-11-14 (×4): qty 1

## 2016-11-14 MED ORDER — HYDROCODONE-ACETAMINOPHEN 5-325 MG PO TABS
ORAL_TABLET | ORAL | Status: AC
  Administered 2016-11-14: 1 via ORAL
  Filled 2016-11-14: qty 1

## 2016-11-14 MED ORDER — FAMOTIDINE 20 MG PO TABS
20.0000 mg | ORAL_TABLET | Freq: Once | ORAL | Status: AC
  Administered 2016-11-14: 20 mg via ORAL

## 2016-11-14 MED ORDER — TAMSULOSIN HCL 0.4 MG PO CAPS
0.4000 mg | ORAL_CAPSULE | Freq: Every day | ORAL | Status: DC
  Administered 2016-11-14: 0.4 mg via ORAL
  Filled 2016-11-14 (×2): qty 1

## 2016-11-14 MED ORDER — FENTANYL CITRATE (PF) 100 MCG/2ML IJ SOLN
INTRAMUSCULAR | Status: AC
  Filled 2016-11-14: qty 2

## 2016-11-14 MED ORDER — HEPARIN SODIUM (PORCINE) 5000 UNIT/ML IJ SOLN
INTRAMUSCULAR | Status: AC
  Administered 2016-11-14: 5000 [IU] via SUBCUTANEOUS
  Filled 2016-11-14: qty 1

## 2016-11-14 MED ORDER — FENTANYL CITRATE (PF) 100 MCG/2ML IJ SOLN
INTRAMUSCULAR | Status: AC
  Administered 2016-11-14: 25 ug via INTRAVENOUS
  Filled 2016-11-14: qty 2

## 2016-11-14 MED ORDER — ROCURONIUM BROMIDE 50 MG/5ML IV SOSY
PREFILLED_SYRINGE | INTRAVENOUS | Status: AC
  Filled 2016-11-14: qty 5

## 2016-11-14 MED ORDER — BELLADONNA ALKALOIDS-OPIUM 16.2-60 MG RE SUPP: 1.0000 | Freq: Four times a day (QID) | RECTAL | Status: DC | PRN

## 2016-11-14 MED ORDER — SUCCINYLCHOLINE CHLORIDE 200 MG/10ML IV SOSY
PREFILLED_SYRINGE | INTRAVENOUS | Status: AC
  Filled 2016-11-14: qty 10

## 2016-11-14 MED ORDER — ONDANSETRON HCL 4 MG/2ML IJ SOLN
INTRAMUSCULAR | Status: DC | PRN
  Administered 2016-11-14: 4 mg via INTRAVENOUS

## 2016-11-14 MED ORDER — CARBOXYMETHYLCELLULOSE SODIUM 0.25 % OP SOLN: Freq: Every day | OPHTHALMIC | Status: DC

## 2016-11-14 MED ORDER — PROPOFOL 10 MG/ML IV BOLUS
INTRAVENOUS | Status: AC
  Filled 2016-11-14: qty 20

## 2016-11-14 MED ORDER — OXYBUTYNIN CHLORIDE 5 MG PO TABS: 5.0000 mg | ORAL_TABLET | Freq: Three times a day (TID) | ORAL | Status: DC | PRN

## 2016-11-14 MED ORDER — DEXAMETHASONE SODIUM PHOSPHATE 10 MG/ML IJ SOLN
INTRAMUSCULAR | Status: DC | PRN
  Administered 2016-11-14: 5 mg via INTRAVENOUS

## 2016-11-14 MED ORDER — FINASTERIDE 5 MG PO TABS
5.0000 mg | ORAL_TABLET | Freq: Every day | ORAL | Status: DC
  Administered 2016-11-14: 5 mg via ORAL
  Filled 2016-11-14 (×2): qty 1

## 2016-11-14 MED ORDER — HYDROCODONE-ACETAMINOPHEN 5-325 MG PO TABS
1.0000 | ORAL_TABLET | ORAL | Status: DC | PRN
  Administered 2016-11-14 (×2): 1 via ORAL
  Filled 2016-11-14: qty 1

## 2016-11-14 MED ORDER — LACTATED RINGERS IV SOLN
INTRAVENOUS | Status: DC
  Administered 2016-11-14: 10:00:00 via INTRAVENOUS

## 2016-11-14 MED ORDER — DONEPEZIL HCL 5 MG PO TABS
10.0000 mg | ORAL_TABLET | Freq: Every day | ORAL | Status: DC
  Administered 2016-11-14: 10 mg via ORAL
  Filled 2016-11-14: qty 2

## 2016-11-14 MED ORDER — ENSURE ENLIVE PO LIQD: 237.0000 mL | Freq: Two times a day (BID) | ORAL | Status: DC

## 2016-11-14 MED ORDER — FUROSEMIDE 20 MG PO TABS
20.0000 mg | ORAL_TABLET | Freq: Every day | ORAL | Status: DC
  Filled 2016-11-14 (×2): qty 1

## 2016-11-14 MED ORDER — CARBIDOPA-LEVODOPA 25-100 MG PO TABS
2.0000 | ORAL_TABLET | Freq: Four times a day (QID) | ORAL | Status: DC
  Administered 2016-11-14 (×2): 2 via ORAL
  Filled 2016-11-14 (×10): qty 2

## 2016-11-14 MED ORDER — CIPROFLOXACIN IN D5W 400 MG/200ML IV SOLN
400.0000 mg | INTRAVENOUS | Status: AC
  Administered 2016-11-14 (×2): 400 mg via INTRAVENOUS

## 2016-11-14 MED ORDER — CEPHALEXIN 250 MG PO CAPS
500.0000 mg | ORAL_CAPSULE | Freq: Three times a day (TID) | ORAL | Status: DC
  Administered 2016-11-14 – 2016-11-15 (×2): 500 mg via ORAL
  Filled 2016-11-14 (×2): qty 2

## 2016-11-14 MED ORDER — CIPROFLOXACIN IN D5W 400 MG/200ML IV SOLN
INTRAVENOUS | Status: AC
  Administered 2016-11-14: 400 mg via INTRAVENOUS
  Filled 2016-11-14: qty 200

## 2016-11-14 MED ORDER — MORPHINE SULFATE (PF) 2 MG/ML IV SOLN: 2.0000 mg | INTRAVENOUS | Status: DC | PRN

## 2016-11-14 MED ORDER — DOCUSATE SODIUM 100 MG PO CAPS: 100.0000 mg | ORAL_CAPSULE | Freq: Two times a day (BID) | ORAL | Status: DC | PRN

## 2016-11-14 MED ORDER — ONDANSETRON HCL 4 MG/2ML IJ SOLN
INTRAMUSCULAR | Status: AC
  Filled 2016-11-14: qty 2

## 2016-11-14 MED ORDER — QUETIAPINE FUMARATE 25 MG PO TABS
25.0000 mg | ORAL_TABLET | Freq: Once | ORAL | Status: AC
  Administered 2016-11-14: 25 mg via ORAL
  Filled 2016-11-14: qty 1

## 2016-11-14 MED ORDER — PIMAVANSERIN TARTRATE 17 MG PO TABS: 17.0000 mg | ORAL_TABLET | Freq: Every day | ORAL | Status: DC

## 2016-11-14 MED ORDER — LIDOCAINE HCL (CARDIAC) 20 MG/ML IV SOLN
INTRAVENOUS | Status: DC | PRN
  Administered 2016-11-14: 100 mg via INTRAVENOUS

## 2016-11-14 MED ORDER — HEPARIN SODIUM (PORCINE) 5000 UNIT/ML IJ SOLN
5000.0000 [IU] | Freq: Three times a day (TID) | INTRAMUSCULAR | Status: DC
  Administered 2016-11-14 – 2016-11-15 (×3): 5000 [IU] via SUBCUTANEOUS
  Filled 2016-11-14: qty 1

## 2016-11-14 MED ORDER — ONDANSETRON HCL 4 MG/2ML IJ SOLN: 4.0000 mg | INTRAMUSCULAR | Status: DC | PRN

## 2016-11-14 MED ORDER — SERTRALINE HCL 50 MG PO TABS
50.0000 mg | ORAL_TABLET | Freq: Every day | ORAL | Status: DC
  Filled 2016-11-14: qty 1

## 2016-11-14 MED ORDER — ONDANSETRON HCL 4 MG/2ML IJ SOLN: 4.0000 mg | Freq: Once | INTRAMUSCULAR | Status: DC | PRN

## 2016-11-14 MED ORDER — ACETAMINOPHEN 325 MG PO TABS: 650.0000 mg | ORAL_TABLET | ORAL | Status: DC | PRN

## 2016-11-14 MED ORDER — PROPOFOL 10 MG/ML IV BOLUS
INTRAVENOUS | Status: DC | PRN
  Administered 2016-11-14: 100 mg via INTRAVENOUS

## 2016-11-14 MED ORDER — FAMOTIDINE 20 MG PO TABS
ORAL_TABLET | ORAL | Status: AC
  Filled 2016-11-14: qty 1

## 2016-11-14 SURGICAL SUPPLY — 26 items
ADAPTER IRRIG TUBE 2 SPIKE SOL (ADAPTER) ×4 IMPLANT
BAG DRAIN CYSTO-URO LG1000N (MISCELLANEOUS) ×2 IMPLANT
BAG URO DRAIN 4000ML (MISCELLANEOUS) ×2 IMPLANT
BASKET ZERO TIP 1.9FR (BASKET) IMPLANT
CATH FOL 2WAY LX 24X30 (CATHETERS) IMPLANT
CATH FOL LEG HOLDER (MISCELLANEOUS) ×2 IMPLANT
CATH FOLEY 3WAY 30CC 22FR (CATHETERS) ×2 IMPLANT
DRAPE UTILITY 15X26 TOWEL STRL (DRAPES) ×2 IMPLANT
ELECT LOOP 22F BIPOLAR SML (ELECTROSURGICAL)
ELECTRODE LOOP 22F BIPOLAR SML (ELECTROSURGICAL) IMPLANT
FIBER LASER 1000 (Laser) ×2 IMPLANT
GLOVE BIO SURGEON STRL SZ 6.5 (GLOVE) ×4 IMPLANT
GOWN STRL REUS W/ TWL LRG LVL3 (GOWN DISPOSABLE) ×2 IMPLANT
GOWN STRL REUS W/TWL LRG LVL3 (GOWN DISPOSABLE) ×2
HOLDER FOLEY CATH W/STRAP (MISCELLANEOUS) ×2 IMPLANT
KIT RM TURNOVER CYSTO AR (KITS) ×2 IMPLANT
LOOP CUT BIPOLAR 24F LRG (ELECTROSURGICAL) IMPLANT
PACK CYSTO AR (MISCELLANEOUS) ×2 IMPLANT
SET IRRIG Y TYPE TUR BLADDER L (SET/KITS/TRAYS/PACK) ×2 IMPLANT
SET IRRIGATING DISP (SET/KITS/TRAYS/PACK) ×2 IMPLANT
SOL .9 NS 3000ML IRR  AL (IV SOLUTION) ×8
SOL .9 NS 3000ML IRR UROMATIC (IV SOLUTION) ×8 IMPLANT
SYR TOOMEY 50ML (SYRINGE) ×2 IMPLANT
SYRINGE IRR TOOMEY STRL 70CC (SYRINGE) ×2 IMPLANT
WATER STERILE IRR 1000ML POUR (IV SOLUTION) ×2 IMPLANT
WATER STERILE IRR 3000ML UROMA (IV SOLUTION) IMPLANT

## 2016-11-14 NOTE — Anesthesia Post-op Follow-up Note (Cosign Needed)
Anesthesia QCDR form completed.        

## 2016-11-14 NOTE — Op Note (Signed)
Date of procedure: 11/14/16  Preoperative diagnosis:  1. BPH with obstruction 2. Bladder stones   Postoperative diagnosis:  1. Same as above   Procedure: 1. Cystolitholapaxy 2. TURP  Surgeon: Hollice Espy, MD  Anesthesia: General  Complications: None  Intraoperative findings: 3 large bladder stones measuring 2.5 cm each treated. Channel TURP primarily focusing on bladder neck and obstructing left lateral lobe performed.  EBL: Minimal  Specimens: Prostate chips  Drains: 22 French 3-way Foley catheter on CBI  Indication: Luke Durham is a 83 y.o. patient with history of Parkinson's disease, bladder outlet obstruction and tbladder stones..  After reviewing the management options for treatment, he elected to proceed with the above surgical procedure(s). We have discussed the potential benefits and risks of the procedure, side effects of the proposed treatment, the likelihood of the patient achieving the goals of the procedure, and any potential problems that might occur during the procedure or recuperation. Informed consent has been obtained.  We did specifically discuss the preoperative holding area that we'll perform a channel TURP today in order to unobstruct his outlet but not complete TURP given his history of Parkinson's and overactive symptoms.  Specifically, the risk of worsening overactivity and urge incontinence was reviewed.  Description of procedure:  The patient was taken to the operating room and general anesthesia was induced.  The patient was placed in the dorsal lithotomy position, prepped and draped in the usual sterile fashion, and preoperative antibiotics were administered. A preoperative time-out was performed.   A 26 French resectoscope was advanced using the visual obturator into the bladder. Upon entering the bladder, careful inspection of the prostatic fossa revealed a relatively long prosthetic length of at least 5 cm, friable prostatic mucosa with ectatic  vessels, bilobar coaptation, significantly of the left lateral lobe and a slightly elevated bladder neck. Within the bladder, there was mild trabeculation appreciated. The trigone was a good distance from the bladder neck and the UOs were patent. There are 3 large stones, each measuring approximate 2.5 cm in size. Using continuous irrigation, a 1000  laser fiber was then brought in and using various settings ultimately 2 J and 47 Hz, the stones were fragmented into tiny pieces. There were then irrigated out by hand once there were small enough to be evacuated from the bladder. A To[place]y syringe was also used.   Since this portion of the procedure was complete, the bipolar loop was brought in and using saline as a medium, the bladder neck was taken down. Attention was turned to the left lateral lobe which appeared to be the primary component to his obstruction. A good portion of this lobe was resected with care taken to avoid any resection beyond the inferior. Once a wide open channel was created, careful hemostasis was achieved. The prostate chips were then carefully irrigated from the bladder until none remained. Once hemostasis was deemed adequate, the scope was removed. A 22 French three-way Foley catheter was placed easily and the balloon was filled with 30 cc of sterile water. Moderate drip CBI was initiated. He was then cleaned and dried, repositioned supine position, reversed anesthesia, and taken to PACU in stable condition.  Plan: Patient will be admitted overnight for CBI. We will attempt a voiding trial in the morning.  Hollice Espy, M.D.

## 2016-11-14 NOTE — Anesthesia Preprocedure Evaluation (Addendum)
Anesthesia Evaluation  Patient identified by MRN, date of birth, ID band Patient awake    Reviewed: Allergy & Precautions, NPO status , Patient's Chart, lab work & pertinent test results, reviewed documented beta blocker date and time   Airway Mallampati: II  TM Distance: >3 FB     Dental  (+) Chipped   Pulmonary asthma ,           Cardiovascular      Neuro/Psych PSYCHIATRIC DISORDERS Anxiety Depression    GI/Hepatic GERD  Controlled,  Endo/Other    Renal/GU Renal disease     Musculoskeletal  (+) Arthritis ,   Abdominal   Peds  Hematology   Anesthesia Other Findings Hx of skin ca. Parkinsons. Asthma. Had a spell of low BP about a month ago. ER felt he was dehydrated. EKG ok. He does take lasix. I told them to check with their main physician to decide on the lasix. He does not seem to have significant pedal edema.  Reproductive/Obstetrics                           Anesthesia Physical Anesthesia Plan  ASA: III  Anesthesia Plan: General   Post-op Pain Management:    Induction: Intravenous  Airway Management Planned: Oral ETT and LMA  Additional Equipment:   Intra-op Plan:   Post-operative Plan:   Informed Consent: I have reviewed the patients History and Physical, chart, labs and discussed the procedure including the risks, benefits and alternatives for the proposed anesthesia with the patient or authorized representative who has indicated his/her understanding and acceptance.     Plan Discussed with: CRNA  Anesthesia Plan Comments:         Anesthesia Quick Evaluation

## 2016-11-14 NOTE — Anesthesia Procedure Notes (Signed)
Procedure Name: LMA Insertion Date/Time: 11/14/2016 11:37 AM Performed by: Zetta Bills Pre-anesthesia Checklist: Patient identified, Emergency Drugs available, Suction available and Patient being monitored Patient Re-evaluated:Patient Re-evaluated prior to inductionOxygen Delivery Method: Circle system utilized Preoxygenation: Pre-oxygenation with 100% oxygen Intubation Type: IV induction Ventilation: Mask ventilation without difficulty LMA: LMA inserted LMA Size: 4.0 Number of attempts: 1 Placement Confirmation: positive ETCO2 Tube secured with: Tape Dental Injury: Teeth and Oropharynx as per pre-operative assessment

## 2016-11-14 NOTE — Transfer of Care (Signed)
Immediate Anesthesia Transfer of Care Note  Patient: Luke Durham  Procedure(s) Performed: Procedure(s): CYSTOSCOPY WITH LITHOLAPAXY (N/A) TRANSURETHRAL RESECTION OF THE PROSTATE (TURP) (N/A)  Patient Location: PACU  Anesthesia Type:General  Level of Consciousness: awake  Airway & Oxygen Therapy: Patient Spontanous Breathing  Post-op Assessment: Report given to RN  Post vital signs: stable  Last Vitals:  Vitals:   11/14/16 0940  BP: 124/81  Pulse: 62  Resp: 16  Temp: (!) 35.7 C    Last Pain:  Vitals:   11/14/16 0940  TempSrc: Tympanic         Complications: No apparent anesthesia complications

## 2016-11-14 NOTE — H&P (View-Only) (Signed)
83 year old male who was seen today in follow-up. He has a history of Parkinson's disease and was recently evaluated in an emergency [place] for syncope. As part of the workup he was noted to have hematuria. The patient was then [place], [place]. He has since undergone a CT scan, hematuria protocol, as well as a DRE and PSA. His DRE was abnormal but his PSA was 3. His CT scan demonstrated 3 bladder stones measuring up to 2.4 centimeters.  The patient has a history of bladder outlet obstruction and currently takes both finasteride and Flomax. He describes urinary frequency and urgency. He's had obstructive voiding symptoms for a long time. He denies a history of urinary tract infections. He has never had a Foley catheter.  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 He tolerated anesthesia without issue. The patient has no coronary or cardiac history. His syncopal evaluation was attributed to dehydration, and no significant abnormalities in his workup. He did have an echocardiogram at this time, the results are unavailable to me.  Current Outpatient Prescriptions on File Prior to Visit  Medication Sig Dispense Refill  . amantadine (SYMMETREL) 100 MG capsule Take 1 capsule (100 mg total) by mouth 3 (three) times daily. 270 capsule 3  . carbidopa-levodopa (SINEMET IR) 25-100 MG tablet take 2 tablets by mouth four times a day as directed 270 tablet 11  . docusate sodium (STOOL SOFTENER) 100 MG capsule Take 100 mg by mouth 2 (two) times daily.    Marland Kitchen donepezil (ARICEPT ODT) 10 MG disintegrating tablet Take 1 tablet (10 mg total) by mouth at bedtime. 90 tablet 3  . finasteride (PROSCAR) 5 MG tablet Take 1 tablet (5 mg total) by mouth daily. 90 tablet 3  . furosemide (LASIX) 20 MG tablet Take 1 tablet (20 mg total) by mouth daily. 90 tablet 3  . Multiple Vitamins-Minerals (OCUVITE PO) Take by mouth.    Marland Kitchen Pimavanserin Tartrate  (NUPLAZID) 17 MG TABS Take 1 tablet by mouth daily.    . sertraline (ZOLOFT) 50 MG tablet Take 1 tablet (50 mg total) by mouth daily. 90 tablet 3  . tamsulosin (FLOMAX) 0.4 MG CAPS capsule Take 1 capsule (0.4 mg total) by mouth daily. 90 capsule 3  . aspirin 81 MG tablet Take 81 mg by mouth daily.    . Cholecalciferol (VITAMIN D3 PO) Take by mouth.     No current facility-administered medications on file prior to visit.    Past Medical History:  Diagnosis Date  . Cancer ([place]) 7 years ago   lymphoma, [place] (recent)  . Colon polyps   . History of kidney stones    Past Surgical History:  Procedure Laterality Date  . HERNIA REPAIR  20 years  . SMALL INTESTINE SURGERY     Per patient 7 years   Vitals:   11/01/16 1048  BP: 110/71  Pulse: 66   NAD RRR CTA  No results for input(s): WBC, HGB, HCT in the last 72 hours. No results for input(s): NA, K, CL, CO2, GLUCOSE, BUN, CREATININE, CALCIUM in the last 72 hours. No results for input(s): LABPT, INR in the last 72 hours. No results for input(s): PSA in the last 72 hours. No results for input(s): LABURIN in the last 72 hours. Results for orders placed or performed in visit on 11/01/16  Microscopic Examination     Status: Abnormal   Collection Time: 11/01/16 10:40 AM  Result Value Ref Range  Status   WBC, UA 11-30 (A) 0 - 5 /hpf Final   RBC, UA 0-2 0 - 2 /hpf Final   Epithelial Cells (non renal) 0-10 0 - 10 /hpf Final   Mucus, UA Present (A) Not Estab. Final   Bacteria, UA Few None seen/Few Final    Imaging: I have independently reviewed this patient's CT scan which demonstrates a markedly enlarged prostate with 3 bladder stones measuring up to 2 and a half centimeters. There are no other remarkable findings.  Cystoscopy: The patient was prepped and draped in routine sterile fashion. I then inserted the cystoscope gently into the patient's urethral meatus under visual guidance after the patient had been prepped and lidocaine jelly  injected into his urethra. His urethra was noted to be normal. Upon entering the prostate noted is a significantly enlarged left lateral lobe causing severe obstruction as well as a high median bar. Within the bladder the bladder calculi were easily visualized. There are no other significant abnormality is noted a side from mild bladder trabeculation.   Impression: The patient has hematuria almost certainly from the bladder stones. He also has an obstructing prostate.  Impression/ Plan: We discussed treatment options for the patient. The treatment options R Alveta Heimlich with his significant comorbidities including severe Parkinson's disease. However, given the size of the stones, there only likely to grow and ultimately her risk for developing an infection that he is unable to clear. Further, he has a very large obstructing prostate, particularly the left lateral lobe. I think that it'll make sense to provide the patient a bladder outlet procedure at the time of take care of the stones. As such, we discussed cystolitholapaxy and TURP. I went over the procedure. The patient and his wife in detail. He understands that he will be admitted overnight for continuous bladder irrigation. We'll plan to work with Dr. Erlene Quan on getting this scheduled.

## 2016-11-14 NOTE — Interval H&P Note (Signed)
History and Physical Interval Note:  11/14/2016 11:03 AM  Luke Durham  has presented today for surgery, with the diagnosis of bladder stone  The various methods of treatment have been discussed with the patient and family. After consideration of risks, benefits and other options for treatment, the patient has consented to  Procedure(s): CYSTOSCOPY WITH LITHOLAPAXY (N/A) TRANSURETHRAL RESECTION OF THE PROSTATE (TURP) (N/A) as a surgical intervention .  The patient's history has been reviewed, patient examined, no change in status, stable for surgery.  I have reviewed the patient's chart and labs.  Questions were answered to the patient's satisfaction.    RRR CTAB  Hollice Espy

## 2016-11-14 NOTE — Anesthesia Procedure Notes (Signed)
Procedure Name: LMA Insertion Date/Time: 11/14/2016 11:37 AM Performed by: Aline Brochure Pre-anesthesia Checklist: Patient identified, Emergency Drugs available, Suction available and Patient being monitored Patient Re-evaluated:Patient Re-evaluated prior to inductionOxygen Delivery Method: Circle system utilized Preoxygenation: Pre-oxygenation with 100% oxygen Intubation Type: IV induction Ventilation: Mask ventilation without difficulty LMA: LMA inserted LMA Size: 4.0 Number of attempts: 1 Placement Confirmation: breath sounds checked- equal and bilateral Tube secured with: Tape Dental Injury: Teeth and Oropharynx as per pre-operative assessment

## 2016-11-15 ENCOUNTER — Encounter: Payer: Self-pay | Admitting: Urology

## 2016-11-15 DIAGNOSIS — N401 Enlarged prostate with lower urinary tract symptoms: Secondary | ICD-10-CM | POA: Diagnosis not present

## 2016-11-15 LAB — BASIC METABOLIC PANEL
Anion gap: 7 (ref 5–15)
BUN: 17 mg/dL (ref 6–20)
CO2: 29 mmol/L (ref 22–32)
Calcium: 8.1 mg/dL — ABNORMAL LOW (ref 8.9–10.3)
Chloride: 104 mmol/L (ref 101–111)
Creatinine, Ser: 0.74 mg/dL (ref 0.61–1.24)
GFR calc Af Amer: >60 mL/min (ref >60)
GFR calc non Af Amer: >60 mL/min (ref >60)
Glucose, Bld: 95 mg/dL (ref 65–99)
Potassium: 4 mmol/L (ref 3.5–5.1)
Sodium: 140 mmol/L (ref 135–145)

## 2016-11-15 LAB — CBC
HCT: 32 % — ABNORMAL LOW (ref 40.0–52.0)
Hemoglobin: 11.2 g/dL — ABNORMAL LOW (ref 13.0–18.0)
MCH: 31.2 pg (ref 26.0–34.0)
MCHC: 35 g/dL (ref 32.0–36.0)
MCV: 89 fL (ref 80.0–100.0)
Platelets: 206 10*3/uL (ref 150–440)
RBC: 3.59 MIL/uL — ABNORMAL LOW (ref 4.40–5.90)
RDW: 14.3 % (ref 11.5–14.5)
WBC: 8.4 10*3/uL (ref 3.8–10.6)

## 2016-11-15 MED ORDER — HYDROCODONE-ACETAMINOPHEN 5-325 MG PO TABS: 1.0000 | ORAL_TABLET | Freq: Four times a day (QID) | ORAL | 0 refills | Status: DC | PRN

## 2016-11-15 MED ORDER — DOCUSATE SODIUM 100 MG PO CAPS: 100.0000 mg | ORAL_CAPSULE | Freq: Two times a day (BID) | ORAL | 0 refills | Status: DC

## 2016-11-15 NOTE — Discharge Instructions (Signed)
Transurethral Resection of the Prostate, Care After °Introduction °Refer to this sheet in the next few weeks. These instructions provide you with information about caring for yourself after your procedure. Your health care provider may also give you more specific instructions. Your treatment has been planned according to current medical practices, but problems sometimes occur. Call your health care provider if you have any problems or questions after your procedure. °What can I expect after the procedure? °After the procedure, it is common to have: °· Mild pain in your lower abdomen. °· Soreness or mild discomfort in your penis from having the catheter inserted during the procedure. °· A feeling of urgency when you need to urinate. °· A small amount of blood in your urine. You may notice some small blood clots in your urine. These are normal. °Follow these instructions at home: °Medicines °· Take over-the-counter and prescription medicines only as told by your health care provider. °· Do not drive or operate heavy machinery while taking prescription pain medicine. °· Do not drive for 24 hours if you received a sedative. °· If you were prescribed antibiotic medicine, take it as told by your health care provider. Do not stop taking the antibiotic even if you start to feel better. °Activity °· Return to your normal activities as told by your health care provider. Ask your health care provider what activities are safe for you. °· Do not lift anything that is heavier than 10 lb (4.5 kg) for 3 weeks after your procedure, or as long as told by your health care provider. °· Avoid intense physical activity for as long as told by your health care provider. °· Walk at least one time every day. This helps to prevent blood clots. You may increase your physical activity gradually as you start to feel better. °Lifestyle °· Do not drink alcohol for as long as told by your health care provider. This is especially important if you  are taking prescription pain medicines. °· Do not engage in sexual activity until your health care provider says that you can do this. °General instructions °· Do not take baths, swim, or use a hot tub until your health care provider approves. °· Drink enough fluid to keep your urine clear or pale yellow. °· Urinate as soon as you feel the need to. Do not try to hold your urine for long periods of time. °· If your health care provider approves, you may take a stool softener for 2-3 weeks to prevent you from straining to have a bowel movement. °· Wear compression stockings as told by your health care provider. These stockings help to prevent blood clots and reduce swelling in your legs. °· Keep all follow-up visits as told by your health care provider. This is important. °Contact a health care provider if: °· You have difficulty urinating. °· You have a fever. °· You have pain that gets worse or does not improve with medicine. °· You have blood in your urine that does not go away after 1 week of resting and drinking more fluids. °· You have swelling in your penis or testicles. °Get help right away if: °· You are unable to urinate. °· You are having more blood clots in your urine instead of fewer. °· You have: °¨ Large blood clots. °¨ A lot of blood in your urine. °¨ Pain in your back or lower abdomen. °¨ Pain or swelling in your legs. °¨ Chills and you are shaking. °This information is not intended to replace   advice given to you by your health care provider. Make sure you discuss any questions you have with your health care provider. Document Released: 10/10/2005 Document Revised: 06/12/2016 Document Reviewed: 07/02/2015  2017 [employer]

## 2016-11-15 NOTE — Anesthesia Postprocedure Evaluation (Signed)
Anesthesia Post Note  Patient: Filip Trimmer  Procedure(s) Performed: Procedure(s) (LRB): CYSTOSCOPY WITH LITHOLAPAXY (N/A) TRANSURETHRAL RESECTION OF THE PROSTATE (TURP) (N/A)  Patient location during evaluation: PACU Anesthesia Type: General Level of consciousness: awake and alert Pain management: pain level controlled Vital Signs Assessment: post-procedure vital signs reviewed and stable Respiratory status: spontaneous breathing, nonlabored ventilation, respiratory function stable and patient connected to nasal cannula oxygen Cardiovascular status: blood pressure returned to baseline and stable Postop Assessment: no signs of nausea or vomiting Anesthetic complications: no     Last Vitals:  Vitals:   11/15/16 0535 11/15/16 0825  BP: (!) 98/58 107/61  Pulse: 62 65  Resp: 17 18  Temp: 36.4 C 36.5 C    Last Pain:  Vitals:   11/15/16 0825  TempSrc: Oral  PainSc:                  Martha Clan

## 2016-11-15 NOTE — Progress Notes (Signed)
Pt's wife gave daily medications without consent. Pt's wife was educated and asked to not do that tomorrow if pt is still here.

## 2016-11-15 NOTE — Progress Notes (Signed)
Active voiding trial completed per MD order. Foley clamped and bladder filled up with NS. Urine leaked around foley and catheter was removed. Pt then was able to urinate outside of urinal. Pt was given a urinal and then voided 175ml. Bladder scan was completed and 58 ml was left in bladder.

## 2016-11-15 NOTE — Progress Notes (Signed)
IV was removed. Discharge instructions, follow-up appointments, and prescriptions were provided to the pt and wife at bedside. All questions were answered. The pt was taken downstairs via wheelchair by volunteer services.

## 2016-11-15 NOTE — Discharge Summary (Signed)
Date of admission: 11/14/2016  Date of discharge: 11/15/2016  Admission diagnosis: BPH with BOO, bladder stones.    Discharge diagnosis: same  Secondary diagnoses:  Patient Active Problem List   Diagnosis Date Noted  . BPH with urinary obstruction 11/14/2016  . Urinary frequency 10/10/2016  . Microscopic hematuria 10/10/2016  . Parkinson's syndrome ([place]) 06/14/2016  . Arthritis 06/14/2016  . Depression 06/14/2016  . Heartburn 06/14/2016  . Urinary incontinence 06/14/2016  . Skin lesion 06/14/2016  . Dementia 06/14/2016  . BPH (benign prostatic hyperplasia) 06/14/2016  . Bilateral edema of lower extremity 06/14/2016    History and Physical: For full details, please see admission history and physical. Briefly, Luke Durham is a 83 y.o. year old patient with bladder stones.   Hospital Course: Patient tolerated the procedure well.  He was then transferred to the floor after an uneventful PACU stay.  His hospital course was uncomplicated.   He was admitted for overnight CBI.  CBI clamped in AM and underwent voiding trial   On POD#1 he had met discharge criteria: was eating a regular diet, was up and ambulating independently,  pain was well controlled, was voiding without a catheter, and was ready to for discharge.  Physical Exam  Constitutional: He appears well-developed and well-nourished.  HENT:  Head: Normocephalic and atraumatic.  Cardiovascular: Normal rate.   Pulmonary/Chest: Effort normal. No respiratory distress.  Abdominal: Soft. Bowel sounds are normal. He exhibits no distension.  Genitourinary:  Genitourinary Comments: Foley in place draining light pink urine  Musculoskeletal: Normal range of motion.  Neurological: He is alert.  Skin: Skin is warm and dry.  Psychiatric:  Flat affect  Vitals reviewed.   Laboratory values:   Recent Labs  11/15/16 0524  WBC 8.4  HGB 11.2*  HCT 32.0*    Recent Labs  11/15/16 0524  NA 140  K 4.0  CL 104  CO2 29   GLUCOSE 95  BUN 17  CREATININE 0.74  CALCIUM 8.1*   No results for input(s): LABPT, INR in the last 72 hours. No results for input(s): LABURIN in the last 72 hours. Results for orders placed or performed during the hospital encounter of 11/07/16  Urine culture     Status: Abnormal (Preliminary result)   Collection Time: 11/04/16  2:03 PM  Result Value Ref Range Status   Specimen Description URINE, RANDOM  Final   Special Requests NONE  Final   Culture (A)  Final    50,000 COLONIES/mL ENTEROCOCCUS FAECALIS Sent to [facility] for further susceptibility testing. Performed at [facility]    Report Status PENDING  Incomplete    Disposition: Home  Discharge instruction: see hand out  Drink plenty of water  Call with fevers, difficulty voiding.    Complete antibiotic course.    Discharge medications:  Allergies as of 11/15/2016   No Known Allergies     Medication List    TAKE these medications   amantadine 100 MG capsule Commonly known as:  SYMMETREL Take 1 capsule (100 mg total) by mouth 3 (three) times daily.   Amantadine HCl 100 MG tablet Take 100 mg by mouth daily.   carbidopa-levodopa 25-100 MG tablet Commonly known as:  SINEMET IR take 2 tablets by mouth four times a day as directed What changed:  how much to take  how to take this  when to take this  additional instructions   cephALEXin 500 MG capsule Commonly known as:  KEFLEX Take 1 capsule (500 mg total) by mouth  3 (three) times daily.   donepezil 10 MG disintegrating tablet Commonly known as:  ARICEPT ODT Take 1 tablet (10 mg total) by mouth at bedtime.   donepezil 10 MG tablet Commonly known as:  ARICEPT Take 10 mg by mouth at bedtime.   econazole nitrate 1 % cream Apply 1 application topically daily. Applied to feet   finasteride 5 MG tablet Commonly known as:  PROSCAR Take 1 tablet (5 mg total) by mouth daily.   furosemide 20 MG tablet Commonly known as:  LASIX Take 1 tablet  (20 mg total) by mouth daily.   HYDROcodone-acetaminophen 5-325 MG tablet Commonly known as:  NORCO/VICODIN Take 1-2 tablets by mouth every 6 (six) hours as needed for moderate pain.   multivitamin with minerals Tabs tablet Take 1 tablet by mouth daily. Multivitamin for Men   NUPLAZID 17 MG Tabs Generic drug:  Pimavanserin Tartrate Take 17 mg by mouth daily.   OCUVITE PO Take 1 tablet by mouth daily.   sertraline 50 MG tablet Commonly known as:  ZOLOFT Take 1 tablet (50 mg total) by mouth daily.   STOOL SOFTENER 100 MG capsule Generic drug:  docusate sodium Take 100 mg by mouth 2 (two) times daily as needed (for constipation). What changed:  Another medication with the same name was added. Make sure you understand how and when to take each.   docusate sodium 100 MG capsule Commonly known as:  COLACE Take 1 capsule (100 mg total) by mouth 2 (two) times daily. What changed:  You were already taking a medication with the same name, and this prescription was added. Make sure you understand how and when to take each.   tamsulosin 0.4 MG Caps capsule Commonly known as:  FLOMAX Take 1 capsule (0.4 mg total) by mouth daily.   THERATEARS OP Place 1-2 drops into both eyes at bedtime.       Followup:  Follow-up Information    Hollice Espy, MD In 4 weeks.   Specialty:  Urology Contact information: [address] [place] [address] [phone]

## 2016-11-15 NOTE — Care Management Obs Status (Signed)
[facility] NOTIFICATION   Patient Details  Name: Cylas Belmont MRN: [id number] Date of Birth: [birth year]   Medicare Observation Status Notification Given:  No (Admitted observation less than 24 hours)    Beverly Sessions, RN 11/15/2016, 1:28 PM

## 2016-11-16 ENCOUNTER — Ambulatory Visit: Payer: Medicare Other | Admitting: Physical Therapy

## 2016-11-16 ENCOUNTER — Encounter: Payer: Medicare Other | Admitting: Occupational Therapy

## 2016-11-16 LAB — SURGICAL PATHOLOGY

## 2016-11-18 LAB — URINE CULTURE: Culture: 50000 — AB

## 2016-11-22 LAB — SUSCEPTIBILITY, AER + ANAEROB

## 2016-11-22 LAB — SUSCEPTIBILITY RESULT

## 2016-11-23 ENCOUNTER — Encounter: Payer: Medicare Other | Admitting: Occupational Therapy

## 2016-11-23 ENCOUNTER — Ambulatory Visit: Payer: Medicare Other | Admitting: Physical Therapy

## 2016-11-24 ENCOUNTER — Telehealth: Payer: Self-pay | Admitting: Physician Assistant

## 2016-11-24 DIAGNOSIS — M6281 Muscle weakness (generalized): Secondary | ICD-10-CM

## 2016-11-24 DIAGNOSIS — R2681 Unsteadiness on feet: Secondary | ICD-10-CM

## 2016-11-24 NOTE — Telephone Encounter (Signed)
Spoke w/ wife and she wants to have Anderson Malta authorize the PT treatments at [facility] for the patient scheduled for 2/14. Please call to confirm with wife once this has been authorized if needed. - knb

## 2016-11-25 ENCOUNTER — Telehealth: Payer: Self-pay

## 2016-11-25 NOTE — Telephone Encounter (Signed)
Please review-aa 

## 2016-11-25 NOTE — Telephone Encounter (Signed)
See other message, error-aa

## 2016-11-25 NOTE — Telephone Encounter (Signed)
Order put in-aa 

## 2016-11-25 NOTE — Telephone Encounter (Signed)
Please order, what order do we need to put in?-aa

## 2016-11-25 NOTE — Telephone Encounter (Signed)
Ok to give verbal ok for PT at [facility]

## 2016-11-25 NOTE — Telephone Encounter (Signed)
Please enter order in EPIC for PT

## 2016-11-29 ENCOUNTER — Ambulatory Visit: Payer: Medicare Other | Admitting: Physical Therapy

## 2016-11-29 ENCOUNTER — Encounter: Payer: Medicare Other | Admitting: Occupational Therapy

## 2016-12-02 DIAGNOSIS — G903 Multi-system degeneration of the autonomic nervous system: Secondary | ICD-10-CM | POA: Insufficient documentation

## 2016-12-07 ENCOUNTER — Ambulatory Visit: Payer: Medicare Other | Attending: Physician Assistant | Admitting: Physical Therapy

## 2016-12-07 ENCOUNTER — Encounter: Payer: Self-pay | Admitting: Physical Therapy

## 2016-12-07 ENCOUNTER — Ambulatory Visit: Payer: Medicare Other | Admitting: Occupational Therapy

## 2016-12-07 DIAGNOSIS — R278 Other lack of coordination: Secondary | ICD-10-CM | POA: Insufficient documentation

## 2016-12-07 DIAGNOSIS — M6281 Muscle weakness (generalized): Secondary | ICD-10-CM

## 2016-12-07 DIAGNOSIS — R2681 Unsteadiness on feet: Secondary | ICD-10-CM

## 2016-12-07 NOTE — Therapy (Signed)
[place] MAIN [facility] SERVICES [address] [place], [place], [address] Phone: [phone]   Fax:  [phone]  Physical Therapy Treatment  Patient Details  Name: Luke Durham MRN: [id number] Date of Birth: 07-May-1933 Referring Provider: Marlyn Corporal  Encounter Date: 12/07/2016      PT End of Session - 12/07/16 1027    Visit Number 14   Number of Visits 33   Date for PT Re-Evaluation 02/01/17   Authorization Type G code 4   Authorization Time Period 10   PT Start Time 1015   PT Stop Time 1100   PT Time Calculation (min) 45 min   Equipment Utilized During Treatment Gait belt   Activity Tolerance Patient tolerated treatment well   Behavior During Therapy WFL for tasks assessed/performed      Past Medical History:  Diagnosis Date  . Anxiety   . Arthritis   . Asthma    childhood asthma  . Cancer ([place]) 7 years ago   lymphoma, [place] (recent)  . Chronic kidney disease   . Colon polyps   . GERD (gastroesophageal reflux disease)   . History of kidney stones   . Parkinson disease [facility])     Past Surgical History:  Procedure Laterality Date  . COLON SURGERY    . CYSTOSCOPY WITH LITHOLAPAXY N/A 11/14/2016   Procedure: CYSTOSCOPY WITH LITHOLAPAXY;  Surgeon: Hollice Espy, MD;  Location: [facility] ORS;  Service: Urology;  Laterality: N/A;  . EYE SURGERY Bilateral    Cataract Extraction with IOL  . HERNIA REPAIR  20 years  . MOHS SURGERY    . SMALL INTESTINE SURGERY     Per patient 7 years  . TRANSURETHRAL RESECTION OF PROSTATE N/A 11/14/2016   Procedure: TRANSURETHRAL RESECTION OF THE PROSTATE (TURP);  Surgeon: Hollice Espy, MD;  Location: [facility] ORS;  Service: Urology;  Laterality: N/A;    There were no vitals filed for this visit.      Subjective Assessment - 12/07/16 1025    Subjective Patient reports getting injection in shoulder and has not been having as much pain in right shoulder. He reports no new falls; He reports doing some  stretches but is not compliant with HEP; He reports that he feels that his walking is getting a little better; He has not been walking much on rainy days.    Patient is accompained by: Family member  wife, [name]   Pertinent History Factors affecting rehab: high co-pay, progressive disease, supportive family   Limitations Sitting;Standing;Walking   How long can you sit comfortably? 1 hour   How long can you stand comfortably? 20 min.   How long can you walk comfortably? level surface with RW, greater distance   Patient Stated Goals improve balance and flexibility   Currently in Pain? No/denies            [facility] PT Assessment - 12/07/16 0001      Standardized Balance Assessment   10 Meter Walk 0.95 m/s with RW, community ambulator; slightly slower than last reassessment on 10/12/16 which was 1.0 m/s     Timed Up and Go Test   Normal TUG (seconds) 93   TUG Comments with RW, min A; pt demonstrates significant difficulty turning and sitting in chair; more impaired from last reassessment on 10/12/16 which was 67 sec       TREATMENT Warm up on Nustep BUE/BLE level 2 x4 min (Unbilled);  Instructed patient in 10 meter walk, timed up and go, see above; Patient  had significant difficulty with turning during timed up and go.   Educated patient in HEP/balance exercise: Side step left/right with 2-0 rail assist x4 min; Forward/backward step with 1-0 rail assist x4 min with min A and cues to increase step length especially with backwards stepping; Patient required mod Vcs to increase step length for better foot clearance and to improve erect posture, upper trunk control for better stance control;   Patient required min A for sitting to supine:  PT instructed patient in supine position x2 min to facilitate increased thoracic extension and cervical extension; Patient denies any pain when lying without pillow or support;  PT performed passive LE stretches: Hamstring stretch 15 sec hold x3  bilaterally; Passive knee to chest stretch 15 sec hold x2 bilaterally; Modified piriformis stretch 15 sec hold x3 bilaterally;  Patient able to transition supine to sitting independently with quicker movement and better positioning following stretches;                        PT Education - 12/07/16 1026    Education provided Yes   Education Details gait safety, stretches, progress towards goals;    Person(s) Educated Patient;Spouse   Methods Explanation;Verbal cues   Comprehension Verbalized understanding;Returned demonstration;Verbal cues required             PT Long Term Goals - 12/07/16 1029      PT LONG TERM GOAL #1   Title Patient will be independent in HEP in order to increase patient's ability to maintain gains achieved in therapy and assist with return to PLOF.    Time 8   Period Weeks   Status On-going     PT LONG TERM GOAL #2   Title Patient will decrease TUG test to <20 seconds in order to achieve an independently mobile status and decrease his risk for falls   Time 8   Period Weeks   Status Not Met     PT LONG TERM GOAL #3   Title Patient will increase overall strength to 4+/5 in both UE and LE in order to complete transfers safely with decreased risk for falls.    Time 8   Period Weeks   Status Partially Met     PT LONG TERM GOAL #4   Title Patient will increase his 10 m walk test time to >1.0 m/s in order to be a community ambulator at decreased risk for falls.    Time 8   Period Weeks   Status Partially Met               Plan - 12/07/16 1254    Clinical Impression Statement Patient has missed 3-4 weeks of skilled intervention due to being sick and in the hospital. He is feeling better now and did receive a MD note to resume therapy. PT instructed patient in 10 meter walk, timed up and go to assess progress towards goals. Patient continues to have difficulty with turning and with sit to stand. He demonstrates good gait speed  when walking in straight line. Patient would benefit from additional skilled PT intervention to improve LE strength, balance and gait safety;    Rehab Potential Fair   Clinical Impairments Affecting Rehab Potential Positive factors: motivated, family support, previous good responses to PT; Negative factors: high co-pay, progressive disease; Clinical Presentation: Evolving-multiple falls   PT Frequency 1x / week   PT Duration 8 weeks   PT Treatment/Interventions ADLs/Self Care Home Management;Aquatic Therapy;Electrical Stimulation;Moist  Heat;DME Instruction;Gait training;Stair training;Functional mobility training;Therapeutic activities;Therapeutic exercise;Balance training;Neuromuscular re-education;Patient/family education;Manual techniques;Energy conservation   PT Next Visit Plan work on Animal nutritionist;    [place] continue as given;    Consulted and Agree with Plan of Care Patient;Family member/caregiver      Patient will benefit from skilled therapeutic intervention in order to improve the following deficits and impairments:  Abnormal gait, Decreased balance, Decreased endurance, Decreased knowledge of use of DME, Decreased mobility, Decreased range of motion, Decreased safety awareness, Decreased strength, Difficulty walking, Hypomobility, Increased fascial restricitons, Impaired perceived functional ability, Impaired flexibility, Improper body mechanics, Postural dysfunction  Visit Diagnosis: Muscle weakness (generalized) - Plan: PT plan of care cert/re-cert  Other lack of coordination - Plan: PT plan of care cert/re-cert  Unsteadiness on feet - Plan: PT plan of care cert/re-cert     Problem List Patient Active Problem List   Diagnosis Date Noted  . BPH with urinary obstruction 11/14/2016  . Urinary frequency 10/10/2016  . Microscopic hematuria 10/10/2016  . Parkinson's syndrome ([place]) 06/14/2016  . Arthritis 06/14/2016  . Depression 06/14/2016  . Heartburn  06/14/2016  . Urinary incontinence 06/14/2016  . Skin lesion 06/14/2016  . Dementia 06/14/2016  . BPH (benign prostatic hyperplasia) 06/14/2016  . Bilateral edema of lower extremity 06/14/2016    Trotter,Margaret PT, DPT 12/07/2016, 1:00 PM  [place] MAIN [facility] SERVICES [address] [place], [place], [address] Phone: [phone]   Fax:  [phone]  Name: Luke Durham MRN: [id number] Date of Birth: 09/28/1933

## 2016-12-10 ENCOUNTER — Encounter: Payer: Self-pay | Admitting: Occupational Therapy

## 2016-12-10 NOTE — Therapy (Signed)
[place] MAIN [facility] SERVICES [address] [place], [place], [address] Phone: [phone]   Fax:  [phone]  Occupational Therapy Treatment  Patient Details  Name: Luke Durham MRN: [id number] Date of Birth: 1933-06-24 Referring Provider: Marlyn Corporal  Encounter Date: 12/07/2016      OT End of Session - 12/10/16 1301    Visit Number 5   Number of Visits 12   Date for OT Re-Evaluation 01/04/17   Authorization Type Medicare visit 5   OT Start Time 1108   OT Stop Time 1155   OT Time Calculation (min) 47 min   Activity Tolerance Patient tolerated treatment well   Behavior During Therapy [facility] for tasks assessed/performed      Past Medical History:  Diagnosis Date  . Anxiety   . Arthritis   . Asthma    childhood asthma  . Cancer ([place]) 7 years ago   lymphoma, [place] (recent)  . Chronic kidney disease   . Colon polyps   . GERD (gastroesophageal reflux disease)   . History of kidney stones   . Parkinson disease [facility])     Past Surgical History:  Procedure Laterality Date  . COLON SURGERY    . CYSTOSCOPY WITH LITHOLAPAXY N/A 11/14/2016   Procedure: CYSTOSCOPY WITH LITHOLAPAXY;  Surgeon: Hollice Espy, MD;  Location: [facility] ORS;  Service: Urology;  Laterality: N/A;  . EYE SURGERY Bilateral    Cataract Extraction with IOL  . HERNIA REPAIR  20 years  . MOHS SURGERY    . SMALL INTESTINE SURGERY     Per patient 7 years  . TRANSURETHRAL RESECTION OF PROSTATE N/A 11/14/2016   Procedure: TRANSURETHRAL RESECTION OF THE PROSTATE (TURP);  Surgeon: Hollice Espy, MD;  Location: [facility] ORS;  Service: Urology;  Laterality: N/A;    There were no vitals filed for this visit.      Subjective Assessment - 12/10/16 1257    Subjective  Patient reports he is doing much better now.  No pain noted.    Patient is accompained by: Family member   Pertinent History Patient reports he was diagnosed with Parkinson's disease about 10 plus years ago.  He reports  a more recent decline in function in his daily activities in the last 6-12 months.  He has been seeing PT for the last couple months.   Patient Stated Goals Patient reports he wants to be able to do more for himself and around the house.    Currently in Pain? No/denies   Pain Score 0-No pain                      OT Treatments/Exercises (OP) - 12/10/16 1258      ADLs   Writing Patient seen for focus on handwriting skills this date with instruction on hand flicks prior to starting writing and in between sets.  Focused on print and script of name.  3 different pens used and does best with built up pen with fluid ink. To continue at home with daily exercise for handwriting to improve size of letters and legibility.  Cues for size of letters in the clinic verbally and with use of lines drawn.     Neurological Re-education Exercises   Other Exercises 1 Patient seen for grip strengthening tasks with 11# with right and left hands with cues for technique and sustained grip.                 OT Education - 12/10/16 1301  Education provided Yes   Education Details handwriting strategies, pen   Person(s) Educated Patient   Methods Explanation;Demonstration;Verbal cues   Comprehension Verbal cues required;Returned demonstration;Verbalized understanding             OT Long Term Goals - 11/09/16 1738      OT LONG TERM GOAL #1   Title Patient will demonstrate increase right hand grip to be able to cut food with modified independence.    Baseline unable at eval   Time 12   Period Weeks   Status On-going     OT LONG TERM GOAL #2   Title Patient will complete donning long sleeve shirt with modified independence.   Baseline requires assist with long sleeves, can perform short sleeves.   Time 12   Period Weeks   Status On-going     OT LONG TERM GOAL #3   Title Patient will improve coordination to manage buttons on clothing with occasional assistance only.   Baseline  requires assist with buttons each time   Time 12   Period Weeks   Status On-going     OT LONG TERM GOAL #4   Title Patient will complete managing pants after toileting with modified independence.    Baseline assist most days    Time 12   Period Weeks   Status On-going     OT LONG TERM GOAL #5   Title Patient will improve hand strength bilaterally to be able to don socks with modified independence.    Baseline unable at evaluation   Time 12   Period Weeks   Status On-going               Plan - 12/10/16 1302    Clinical Impression Statement Patient continues to demonstrate difficulty with handwriting with printing and script of his first name.  Responds well to hand flicks and cues both verbal and tactile for size of letters and stroke formation.  Assessment of different pens and does best with  fluid ink pen with built up grip.  Continue to work towards goals to improve strength and coordination.    Rehab Potential Good   Clinical Impairments Affecting Rehab Potential positive: family support, negative: level of motivation, progressive disease   OT Frequency 1x / week   OT Duration 12 weeks   OT Treatment/Interventions Self-care/ADL training;Moist Heat;DME and/or AE instruction;Patient/family education;Therapeutic exercises;Balance training;Therapeutic exercise;Therapeutic activities;Neuromuscular education;Functional Mobility Training;Manual Therapy   Consulted and Agree with Plan of Care Patient;Family member/caregiver   Family Member Consulted wife      Patient will benefit from skilled therapeutic intervention in order to improve the following deficits and impairments:  Abnormal gait, Decreased coordination, Decreased range of motion, Difficulty walking, Decreased endurance, Decreased safety awareness, Decreased activity tolerance, Decreased balance, Impaired UE functional use, Pain, Decreased mobility, Decreased strength  Visit Diagnosis: Muscle weakness  (generalized)  Other lack of coordination    Problem List Patient Active Problem List   Diagnosis Date Noted  . BPH with urinary obstruction 11/14/2016  . Urinary frequency 10/10/2016  . Microscopic hematuria 10/10/2016  . Parkinson's syndrome ([place]) 06/14/2016  . Arthritis 06/14/2016  . Depression 06/14/2016  . Heartburn 06/14/2016  . Urinary incontinence 06/14/2016  . Skin lesion 06/14/2016  . Dementia 06/14/2016  . BPH (benign prostatic hyperplasia) 06/14/2016  . Bilateral edema of lower extremity 06/14/2016   Amy T Lovett, OTR/L, CLT  Lovett,Amy 12/10/2016, 1:05 PM  [place] MAIN [facility] SERVICES [place]  [place], [place], [address] Phone: [phone]   Fax:  [phone]  Name: Luke Durham MRN: [id number] Date of Birth: Oct 18, 1933

## 2016-12-13 ENCOUNTER — Encounter: Payer: Self-pay | Admitting: Urology

## 2016-12-13 ENCOUNTER — Ambulatory Visit (INDEPENDENT_AMBULATORY_CARE_PROVIDER_SITE_OTHER): Payer: Medicare Other | Admitting: Urology

## 2016-12-13 VITALS — BP 100/64 | HR 90 | Wt 168.0 lb

## 2016-12-13 DIAGNOSIS — N390 Urinary tract infection, site not specified: Secondary | ICD-10-CM

## 2016-12-13 DIAGNOSIS — Z87448 Personal history of other diseases of urinary system: Secondary | ICD-10-CM

## 2016-12-13 DIAGNOSIS — R350 Frequency of micturition: Secondary | ICD-10-CM

## 2016-12-13 DIAGNOSIS — N401 Enlarged prostate with lower urinary tract symptoms: Secondary | ICD-10-CM

## 2016-12-13 DIAGNOSIS — N138 Other obstructive and reflux uropathy: Secondary | ICD-10-CM

## 2016-12-13 LAB — URINALYSIS, COMPLETE
Bilirubin, UA: NEGATIVE
Glucose, UA: NEGATIVE
Ketones, UA: NEGATIVE
Nitrite, UA: NEGATIVE
Specific Gravity, UA: >1.03 — ABNORMAL HIGH (ref 1.005–1.030)
Urobilinogen, Ur: 0.2 mg/dL (ref 0.2–1.0)
pH, UA: 5.5 (ref 5.0–7.5)

## 2016-12-13 LAB — MICROSCOPIC EXAMINATION: WBC, UA: >30 /hpf — AB (ref 0–?)

## 2016-12-13 LAB — BLADDER SCAN AMB NON-IMAGING

## 2016-12-13 MED ORDER — DOCUSATE SODIUM 100 MG PO CAPS: 100.0000 mg | ORAL_CAPSULE | Freq: Two times a day (BID) | ORAL | 11 refills | Status: DC

## 2016-12-13 NOTE — Progress Notes (Signed)
12/13/2016 2:43 PM   Luke Durham 23-Feb-1933 [id number]  Referring provider: Mar Daring, PA-C [place], [place] [address]  Chief Complaint  Patient presents with  . Routine Post Op    HPI: 83 year old male with a history of Parkinson's disease, bladder outlet obstruction, bladder stones who presents today for routine follow-up following TURP, cystolitholapaxy of 11/14/2016.  His Foley catheter was removed prior to discharge.  Since surgery, his been voiding fairly well. He's been able to empty his bladder without difficulty. He notes that his stream is somewhat improved. He did have some mild frequency and urgency which is improved dramatically since stones were treated.  He denies any fevers, chills, nausea, or vomiting. No dysuria or gross hematuria.  He does not complain of any incontinence.  PVR today is minimal.     PMH: Past Medical History:  Diagnosis Date  . Anxiety   . Arthritis   . Asthma    childhood asthma  . Cancer ([place]) 7 years ago   lymphoma, [place] (recent)  . Chronic kidney disease   . Colon polyps   . GERD (gastroesophageal reflux disease)   . History of kidney stones   . Parkinson disease [facility])     Surgical History: Past Surgical History:  Procedure Laterality Date  . COLON SURGERY    . CYSTOSCOPY WITH LITHOLAPAXY N/A 11/14/2016   Procedure: CYSTOSCOPY WITH LITHOLAPAXY;  Surgeon: Hollice Espy, MD;  Location: [facility] ORS;  Service: Urology;  Laterality: N/A;  . EYE SURGERY Bilateral    Cataract Extraction with IOL  . HERNIA REPAIR  20 years  . MOHS SURGERY    . SMALL INTESTINE SURGERY     Per patient 7 years  . TRANSURETHRAL RESECTION OF PROSTATE N/A 11/14/2016   Procedure: TRANSURETHRAL RESECTION OF THE PROSTATE (TURP);  Surgeon: Hollice Espy, MD;  Location: [facility] ORS;  Service: Urology;  Laterality: N/A;    Home Medications:  Allergies as of 12/13/2016   No Known Allergies     Medication List         Accurate as of 12/13/16  2:43 PM. Always use your most recent med list.          Amantadine HCl 100 MG tablet Take 100 mg by mouth daily.   carbidopa-levodopa 25-100 MG tablet Commonly known as:  SINEMET IR take 2 tablets by mouth four times a day as directed   docusate sodium 100 MG capsule Commonly known as:  COLACE Take 1 capsule (100 mg total) by mouth 2 (two) times daily.   donepezil 10 MG tablet Commonly known as:  ARICEPT Take 10 mg by mouth at bedtime.   econazole nitrate 1 % cream Apply 1 application topically daily. Applied to feet   furosemide 20 MG tablet Commonly known as:  LASIX Take 1 tablet (20 mg total) by mouth daily.   niacinamide 500 MG tablet   NUPLAZID 17 MG Tabs Generic drug:  Pimavanserin Tartrate Take 17 mg by mouth daily.   OCUVITE PO Take 1 tablet by mouth daily.   sertraline 50 MG tablet Commonly known as:  ZOLOFT Take 1 tablet (50 mg total) by mouth daily.   tamsulosin 0.4 MG Caps capsule Commonly known as:  FLOMAX Take 1 capsule (0.4 mg total) by mouth daily.   THERATEARS OP Place 1-2 drops into both eyes at bedtime.       Allergies: No Known Allergies  Family History: Family History  Problem Relation Age of Onset  .  Heart disease Father   . Bladder Cancer Neg Hx   . Prostate cancer Neg Hx   . Kidney cancer Neg Hx     Social History:  reports that he has never smoked. He has never used smokeless tobacco. He reports that he drinks alcohol. He reports that he does not use drugs.  ROS: UROLOGY Frequent Urination?: Yes Hard to postpone urination?: Yes Burning/pain with urination?: No Get up at night to urinate?: Yes Leakage of urine?: No Urine stream starts and stops?: No Trouble starting stream?: No Do you have to strain to urinate?: No Blood in urine?: No Urinary tract infection?: No Sexually transmitted disease?: No Injury to kidneys or bladder?: No Painful intercourse?: No Weak stream?: No Erection  problems?: No Penile pain?: No  Gastrointestinal Nausea?: No Vomiting?: No Indigestion/heartburn?: No Diarrhea?: No Constipation?: Yes  Constitutional Fever: No Night sweats?: No Weight loss?: No Fatigue?: Yes  Skin Skin rash/lesions?: No Itching?: No  Eyes Blurred vision?: No Double vision?: No  Ears/Nose/Throat Sore throat?: No Sinus problems?: No  Hematologic/Lymphatic Swollen glands?: No Easy bruising?: No  Cardiovascular Leg swelling?: No Chest pain?: No  Respiratory Cough?: No Shortness of breath?: No  Endocrine Excessive thirst?: No  Musculoskeletal Back pain?: No Joint pain?: Yes  Neurological Headaches?: No Dizziness?: No  Psychologic Depression?: Yes Anxiety?: Yes  Physical Exam: BP 100/64   Pulse 90   Wt 168 lb (76.2 kg)   BMI 27.12 kg/m   Constitutional:  Alert and oriented, No acute distress.  Accompanied by wife today. HEENT: [place] AT, moist mucus membranes.  Trachea midline, no masses. Cardiovascular: No clubbing, cyanosis, or edema. Respiratory: Normal respiratory effort, no increased work of breathing. GI: Abdomen is soft, nontender, nondistended, no abdominal masses GU: No CVA tenderness.  Skin: No rashes, bruises or suspicious lesions. Neurologic: Grossly intact, no focal deficits, moving all 4 extremities.  Upper extremity tremor noted.  Ambulating slowly. Psychiatric: Normal mood and affect.  Laboratory Data: Lab Results  Component Value Date   WBC 8.4 11/15/2016   HGB 11.2 (L) 11/15/2016   HCT 32.0 (L) 11/15/2016   MCV 89.0 11/15/2016   PLT 206 11/15/2016    Lab Results  Component Value Date   CREATININE 0.74 11/15/2016     Urinalysis/ Pertinent Imaging: Results for orders placed or performed in visit on 12/13/16  Microscopic Examination  Result Value Ref Range   WBC, UA >30 (A) 0 - 5 /hpf   RBC, UA 3-10 (A) 0 - 2 /hpf   Epithelial Cells (non renal) 0-10 0 - 10 /hpf   Bacteria, UA Few None seen/Few   Urinalysis, Complete  Result Value Ref Range   Specific Gravity, UA >1.030 (H) 1.005 - 1.030   pH, UA 5.5 5.0 - 7.5   Color, UA Yellow Yellow   Appearance Ur Cloudy (A) Clear   Leukocytes, UA 3+ (A) Negative   Protein, UA 2+ (A) Negative/Trace   Glucose, UA Negative Negative   Ketones, UA Negative Negative   RBC, UA 2+ (A) Negative   Bilirubin, UA Negative Negative   Urobilinogen, Ur 0.2 0.2 - 1.0 mg/dL   Nitrite, UA Negative Negative   Microscopic Examination See below:   BLADDER SCAN AMB NON-IMAGING  Result Value Ref Range   Scan Result 35ml     Assessment & Plan:    1. History of bladder stone S/p cystolitholapaxy - Urinalysis, Complete - CULTURE, URINE COMPREHENSIVE  2. Benign prostatic hyperplasia with urinary obstruction S/p TURP PVR excellent today -  BLADDER SCAN AMB NON-IMAGING  3. Urinary frequency Improving after surgery, likely irritative stones - BLADDER SCAN AMB NON-IMAGING  4. Recurrent UTI History of recurrent UTIs prior to surgery  Currently asymptomatic although UA today is suspicious Recheck UCx today but will only treat if become symptomatic    Return in about 4 months (around 04/12/2017) for uroflow/ PVR.  Hollice Espy, MD  [facility] [address], [place], [place] [address] [phone]

## 2016-12-14 ENCOUNTER — Encounter: Payer: Medicare Other | Admitting: Occupational Therapy

## 2016-12-14 ENCOUNTER — Ambulatory Visit: Payer: Medicare Other | Admitting: Physical Therapy

## 2016-12-16 ENCOUNTER — Ambulatory Visit (INDEPENDENT_AMBULATORY_CARE_PROVIDER_SITE_OTHER): Payer: Medicare Other | Admitting: Physician Assistant

## 2016-12-16 DIAGNOSIS — Z23 Encounter for immunization: Secondary | ICD-10-CM | POA: Diagnosis not present

## 2016-12-16 NOTE — Progress Notes (Signed)
Patient presented for Prevnar 13 vaccination. Patient was not seen by me but I was available if any questions or issues arose. Vaccine given to patient without complications. Patient sat for 15 minutes after administration and was tolerated well without adverse effects.

## 2016-12-17 LAB — CULTURE, URINE COMPREHENSIVE

## 2016-12-19 ENCOUNTER — Telehealth: Payer: Self-pay

## 2016-12-19 NOTE — Telephone Encounter (Signed)
Spoke with pt wife in reference to ucx results. Wife stated that pt is feeling fine and denied any UTI s/s. Reinforced with wife if pt develops s/s to call back. Wife voiced understanding.

## 2016-12-19 NOTE — Telephone Encounter (Signed)
-----   Message from Hollice Espy, MD sent at 12/19/2016  7:50 AM EST ----- Please let this patient know that he he grew lower colony count of bacteria in his urine.  He was asymptomatic when I saw him last.  Can you ensure that he is still asymptomatic? I only want to treat if he is having symptoms.   If he is having issues, please treat with macrobid 100 mg bid x 7 days.  Otherwise we will hold off.    Hollice Espy, MD

## 2016-12-21 ENCOUNTER — Encounter: Payer: Self-pay | Admitting: Physical Therapy

## 2016-12-21 ENCOUNTER — Ambulatory Visit: Payer: Medicare Other | Admitting: Physical Therapy

## 2016-12-21 ENCOUNTER — Encounter: Payer: Self-pay | Admitting: Occupational Therapy

## 2016-12-21 ENCOUNTER — Ambulatory Visit: Payer: Medicare Other | Admitting: Occupational Therapy

## 2016-12-21 DIAGNOSIS — R2681 Unsteadiness on feet: Secondary | ICD-10-CM

## 2016-12-21 DIAGNOSIS — M6281 Muscle weakness (generalized): Secondary | ICD-10-CM

## 2016-12-21 DIAGNOSIS — R278 Other lack of coordination: Secondary | ICD-10-CM

## 2016-12-21 NOTE — Patient Instructions (Addendum)
Figure Eight    Walk in a figure eight pattern. (Put two pillows in the floor, spread far apart, work on walking around them in a figure 8 pattern.  Repeat _5___ times per session. Do ___2_ sessions per day.  Provide cues to increase erect posture, increase step length and to improve weight shift to improve turning ability;   Copyright  VHI. All rights reserved.

## 2016-12-21 NOTE — Therapy (Signed)
[place] MAIN [facility] SERVICES [address] [place], [place], [address] Phone: [phone]   Fax:  [phone]  Occupational Therapy Treatment  Patient Details  Name: Luke Durham MRN: [id number] Date of Birth: 03-12-1933 Referring Provider: Marlyn Corporal  Encounter Date: 12/21/2016      OT End of Session - 12/21/16 1717    Visit Number 6   Number of Visits 12   Date for OT Re-Evaluation 01/04/17   Authorization Type Medicare visit 6   OT Start Time 1102   OT Stop Time 1150   OT Time Calculation (min) 48 min   Activity Tolerance Patient tolerated treatment well   Behavior During Therapy [facility] for tasks assessed/performed      Past Medical History:  Diagnosis Date  . Anxiety   . Arthritis   . Asthma    childhood asthma  . Cancer ([place]) 7 years ago   lymphoma, [place] (recent)  . Chronic kidney disease   . Colon polyps   . GERD (gastroesophageal reflux disease)   . History of kidney stones   . Parkinson disease [facility])     Past Surgical History:  Procedure Laterality Date  . COLON SURGERY    . CYSTOSCOPY WITH LITHOLAPAXY N/A 11/14/2016   Procedure: CYSTOSCOPY WITH LITHOLAPAXY;  Surgeon: Hollice Espy, MD;  Location: [facility] ORS;  Service: Urology;  Laterality: N/A;  . EYE SURGERY Bilateral    Cataract Extraction with IOL  . HERNIA REPAIR  20 years  . MOHS SURGERY    . SMALL INTESTINE SURGERY     Per patient 7 years  . TRANSURETHRAL RESECTION OF PROSTATE N/A 11/14/2016   Procedure: TRANSURETHRAL RESECTION OF THE PROSTATE (TURP);  Surgeon: Hollice Espy, MD;  Location: [facility] ORS;  Service: Urology;  Laterality: N/A;    There were no vitals filed for this visit.      Subjective Assessment - 12/21/16 1108    Subjective  (P)  Patient denies any falls and is feeling good today, has not done many exercises at home over the last week.  Patient states, "I get depressed because I can't do much."  Wants to be more mobile.    Patient is  accompained by: (P)  Family member   Pertinent History (P)  Patient reports he was diagnosed with Parkinson's disease about 10 plus years ago.  He reports a more recent decline in function in his daily activities in the last 6-12 months.  He has been seeing PT for the last couple months.   Patient Stated Goals (P)  Patient reports he wants to be able to do more for himself and around the house.    Currently in Pain? (P)  No/denies   Pain Score (P)  0-No pain                      OT Treatments/Exercises (OP) - 12/21/16 1718      ADLs   ADL Comments Patient seen this date for instruction on button hook to help with management of buttons at home.  Performed 2 styles of buttons/shirts with the hook with moderate verbal cuing, hands over hand technique.  Wife also educated on and feels even if the patient doesnt use himself she can use it on him since she has arthritis and she has to help him daily.  She would like to order a button hook for home.       Fine Motor Coordination   Other Fine Motor Exercises Patient  seen for fine motor manipulation skills with bilateral UEs to place and remove round objects about 1/2 inch in size with cues for prehension patterns.  Progressed to smaller objects place and removal with cues but tended to drop smaller items more and had increased difficulty with task.                 OT Education - 12/21/16 1716    Education provided Yes   Education Details use of buttonhook, fine motor coordination tasks   Person(s) Educated Patient;Spouse   Methods Explanation;Demonstration;Verbal cues;Tactile cues   Comprehension Returned demonstration;Verbalized understanding;Verbal cues required;Tactile cues required             OT Long Term Goals - 11/09/16 1738      OT LONG TERM GOAL #1   Title Patient will demonstrate increase right hand grip to be able to cut food with modified independence.    Baseline unable at eval   Time 12   Period Weeks    Status On-going     OT LONG TERM GOAL #2   Title Patient will complete donning long sleeve shirt with modified independence.   Baseline requires assist with long sleeves, can perform short sleeves.   Time 12   Period Weeks   Status On-going     OT LONG TERM GOAL #3   Title Patient will improve coordination to manage buttons on clothing with occasional assistance only.   Baseline requires assist with buttons each time   Time 12   Period Weeks   Status On-going     OT LONG TERM GOAL #4   Title Patient will complete managing pants after toileting with modified independence.    Baseline assist most days    Time 12   Period Weeks   Status On-going     OT LONG TERM GOAL #5   Title Patient will improve hand strength bilaterally to be able to don socks with modified independence.    Baseline unable at evaluation   Time 12   Period Weeks   Status On-going               Plan - 12/21/16 1717    Clinical Impression Statement Patient demonstrated difficulty with use of button hook and reports its frustrating when he is slow to complete tasks. He prefers for wife to perform task but she has arthritis and also has difficulty helping him.  She was able to demonstrate the use of the buttonhook to complete the patient's buttons with ease and decreased time.  She is planning to order the button hook for home use.  Patient demos difficulty with fine motor coordination tasks with smaller objects in the clinic dropping items frequently.  He reports frustration at times when he is slow to complete the task.  Continue to work towards goals to increase independence in strength and ROM for daily tasks.    Rehab Potential Good   Clinical Impairments Affecting Rehab Potential positive: family support, negative: level of motivation, progressive disease   OT Frequency 1x / week   OT Duration 12 weeks   OT Treatment/Interventions Self-care/ADL training;Moist Heat;DME and/or AE  instruction;Patient/family education;Therapeutic exercises;Balance training;Therapeutic exercise;Therapeutic activities;Neuromuscular education;Functional Mobility Training;Manual Therapy   Consulted and Agree with Plan of Care Patient;Family member/caregiver   Family Member Consulted wife      Patient will benefit from skilled therapeutic intervention in order to improve the following deficits and impairments:  Abnormal gait, Decreased coordination, Decreased range of motion, Difficulty walking,  Decreased endurance, Decreased safety awareness, Decreased activity tolerance, Decreased balance, Impaired UE functional use, Pain, Decreased mobility, Decreased strength  Visit Diagnosis: Muscle weakness (generalized)  Other lack of coordination    Problem List Patient Active Problem List   Diagnosis Date Noted  . BPH with urinary obstruction 11/14/2016  . Urinary frequency 10/10/2016  . Microscopic hematuria 10/10/2016  . Parkinson's syndrome ([place]) 06/14/2016  . Arthritis 06/14/2016  . Depression 06/14/2016  . Heartburn 06/14/2016  . Urinary incontinence 06/14/2016  . Skin lesion 06/14/2016  . Dementia 06/14/2016  . BPH (benign prostatic hyperplasia) 06/14/2016  . Bilateral edema of lower extremity 06/14/2016   Achilles Dunk, OTR/L, CLT  Lovett,Amy 12/21/2016, 5:24 PM  [place] MAIN [facility] SERVICES [address] [place], [place], [address] Phone: [phone]   Fax:  [phone]  Name: Devohn Kidane MRN: [id number] Date of Birth: August 25, 1933

## 2016-12-21 NOTE — Therapy (Signed)
[place] [facility] MAIN [facility] SERVICES [address] [place], [place], [address] Phone: [phone]   Fax:  [phone]  Physical Therapy Treatment  Patient Details  Name: Luke Durham MRN: [id number] Date of Birth: Sep 27, 1933 Referring Provider: Rosezetta Schlatter  Encounter Date: 12/21/2016      PT End of Session - 12/21/16 1031    Visit Number 15   Number of Visits 33   Date for PT Re-Evaluation 02/01/17   Authorization Type G code 5   Authorization Time Period 10   PT Start Time 1020   PT Stop Time 1100   PT Time Calculation (min) 40 min   Equipment Utilized During Treatment Gait belt   Activity Tolerance Patient tolerated treatment well;No increased pain   Behavior During Therapy WFL for tasks assessed/performed      Past Medical History:  Diagnosis Date  . Anxiety   . Arthritis   . Asthma    childhood asthma  . Cancer ([facility]) 7 years ago   lymphoma, [facility] (recent)  . Chronic kidney disease   . Colon polyps   . GERD (gastroesophageal reflux disease)   . History of kidney stones   . Parkinson disease [facility])     Past Surgical History:  Procedure Laterality Date  . COLON SURGERY    . CYSTOSCOPY WITH LITHOLAPAXY N/A 11/14/2016   Procedure: CYSTOSCOPY WITH LITHOLAPAXY;  Surgeon: Vanna Scotland, MD;  Location: [facility] ORS;  Service: Urology;  Laterality: N/A;  . EYE SURGERY Bilateral    Cataract Extraction with IOL  . HERNIA REPAIR  20 years  . MOHS SURGERY    . SMALL INTESTINE SURGERY     Per patient 7 years  . TRANSURETHRAL RESECTION OF PROSTATE N/A 11/14/2016   Procedure: TRANSURETHRAL RESECTION OF THE PROSTATE (TURP);  Surgeon: Vanna Scotland, MD;  Location: [facility] ORS;  Service: Urology;  Laterality: N/A;    There were no vitals filed for this visit.      Subjective Assessment - 12/21/16 1029    Subjective Patient reports still having right shoulder pain (soreness). He states, "I will probably need another injection in my shoulder" He  denies any new falls;    Patient is accompained by: Family member  wife, Noreene Larsson   Pertinent History Factors affecting rehab: high co-pay, progressive disease, supportive family   Limitations Sitting;Standing;Walking   How long can you sit comfortably? 1 hour   How long can you stand comfortably? 20 min.   How long can you walk comfortably? level surface with RW, greater distance   Patient Stated Goals improve balance and flexibility   Currently in Pain? Yes   Pain Score 5    Pain Location Shoulder   Pain Orientation Right   Pain Descriptors / Indicators Aching;Sore   Pain Type Chronic pain   Pain Frequency Intermittent        TREATMENT Warm up on Nustep BUE/BLE level 2 x12min with cues to keep steps per minute >80  Side step left/right over 1/2 bolster  with 2-1 rail assist x4 min; Forward/backward step over 1/2 bolster with 1 rail assist x4 min with min A and cues to increase step length especially with backwards stepping; Patient required mod Vcs to increase step length for better foot clearance and to improve erect posture, upper trunk control for better stance control; He had increased difficulty reducing rail assist;   Figure 8 walking around dyna disc with CGA for safety and cues to increase step length x3 reps; Pt demonstrates increased  narrow base of support, short shuffled steps with forward flexed posture; Required tactile cues and verbal cues to increase hip flexion and to improve weight shift for better balance.   Patient required supervision with verbal cues for sitting to supine:  PT instructed patient in supine position x2 min to facilitate increased thoracic extension and cervical extension; Patient denies any pain when lying without pillow or support;  PT performed passive LE stretches: Hamstring stretch 15 sec hold x3 bilaterally; Passive knee to chest stretch 15 sec hold x2 bilaterally; Lumbar trunk rotation x10 reps with mod cues and assist to increase  stretch;  Patient able to transition supine to sitting independently with quicker movement and better positioning following stretches;                              PT Education - 12/21/16 1031    Education provided Yes   Education Details HEP reinforced, gait safety, balance/strengthening;    Person(s) Educated Patient   Methods Explanation;Verbal cues   Comprehension Verbalized understanding;Returned demonstration;Verbal cues required             PT Long Term Goals - 12/07/16 1029      PT LONG TERM GOAL #1   Title Patient will be independent in HEP in order to increase patient's ability to maintain gains achieved in therapy and assist with return to PLOF.    Time 8   Period Weeks   Status On-going     PT LONG TERM GOAL #2   Title Patient will decrease TUG test to <20 seconds in order to achieve an independently mobile status and decrease his risk for falls   Time 8   Period Weeks   Status Not Met     PT LONG TERM GOAL #3   Title Patient will increase overall strength to 4+/5 in both UE and LE in order to complete transfers safely with decreased risk for falls.    Time 8   Period Weeks   Status Partially Met     PT LONG TERM GOAL #4   Title Patient will increase his 10 m walk test time to >1.0 m/s in order to be a community ambulator at decreased risk for falls.    Time 8   Period Weeks   Status Partially Met               Plan - 12/21/16 1239    Clinical Impression Statement Patient reports working on forward/backward, side stepping as part of HEP. Patient continues to have difficulty with weight shifting especially with less rail assist. Patient instructed in figure 8 walking to improve gait ability with turning. He requires CGA for safety and cues to increase step length; Patient continues to have forward flexed posture with all tasks. He would benefit from additional skilled PT intervention to improve balance, gait safety and  strength;    Rehab Potential Fair   Clinical Impairments Affecting Rehab Potential Positive factors: motivated, family support, previous good responses to PT; Negative factors: high co-pay, progressive disease; Clinical Presentation: Evolving-multiple falls   PT Frequency 1x / week   PT Duration 8 weeks   PT Treatment/Interventions ADLs/Self Care Home Management;Aquatic Therapy;Electrical Stimulation;Moist Heat;DME Instruction;Gait training;Stair training;Functional mobility training;Therapeutic activities;Therapeutic exercise;Balance training;Neuromuscular re-education;Patient/family education;Manual techniques;Energy conservation   PT Next Visit Plan work on Animal nutritionist;    PT Home Exercise Plan advanced with figure 8 walking;    Consulted and Agree with  Plan of Care Patient;Family member/caregiver      Patient will benefit from skilled therapeutic intervention in order to improve the following deficits and impairments:  Abnormal gait, Decreased balance, Decreased endurance, Decreased knowledge of use of DME, Decreased mobility, Decreased range of motion, Decreased safety awareness, Decreased strength, Difficulty walking, Hypomobility, Increased fascial restricitons, Impaired perceived functional ability, Impaired flexibility, Improper body mechanics, Postural dysfunction  Visit Diagnosis: Muscle weakness (generalized)  Other lack of coordination  Unsteadiness on feet     Problem List Patient Active Problem List   Diagnosis Date Noted  . BPH with urinary obstruction 11/14/2016  . Urinary frequency 10/10/2016  . Microscopic hematuria 10/10/2016  . Parkinson's syndrome ([place]) 06/14/2016  . Arthritis 06/14/2016  . Depression 06/14/2016  . Heartburn 06/14/2016  . Urinary incontinence 06/14/2016  . Skin lesion 06/14/2016  . Dementia 06/14/2016  . BPH (benign prostatic hyperplasia) 06/14/2016  . Bilateral edema of lower extremity 06/14/2016    Trotter,Margaret PT,  DPT 12/21/2016, 12:48 PM  [place] MAIN [facility] SERVICES [address] [place], [place], [address] Phone: [phone]   Fax:  [phone]  Name: Darian Cansler MRN: [id number] Date of Birth: April 29, 1933

## 2016-12-28 ENCOUNTER — Encounter: Payer: Self-pay | Admitting: Physical Therapy

## 2016-12-28 ENCOUNTER — Ambulatory Visit: Payer: Medicare Other | Attending: Physician Assistant | Admitting: Physical Therapy

## 2016-12-28 ENCOUNTER — Ambulatory Visit: Payer: Medicare Other | Admitting: Occupational Therapy

## 2016-12-28 ENCOUNTER — Encounter: Payer: Self-pay | Admitting: Occupational Therapy

## 2016-12-28 DIAGNOSIS — R278 Other lack of coordination: Secondary | ICD-10-CM

## 2016-12-28 DIAGNOSIS — M6281 Muscle weakness (generalized): Secondary | ICD-10-CM | POA: Diagnosis present

## 2016-12-28 DIAGNOSIS — R2681 Unsteadiness on feet: Secondary | ICD-10-CM | POA: Insufficient documentation

## 2016-12-28 DIAGNOSIS — R293 Abnormal posture: Secondary | ICD-10-CM | POA: Diagnosis present

## 2016-12-28 NOTE — Therapy (Signed)
[place] MAIN [facility] SERVICES [address] [place], [place], [address] Phone: [phone]   Fax:  [phone]  Physical Therapy Treatment  Patient Details  Name: Luke Durham MRN: [id number] Date of Birth: April 20, 1933 Referring Provider: Marlyn Corporal  Encounter Date: 12/28/2016      PT End of Session - 12/28/16 1026    Visit Number 16   Number of Visits 33   Date for PT Re-Evaluation 02/01/17   Authorization Type G code 6   Authorization Time Period 10   PT Start Time 1027   PT Stop Time 1114   PT Time Calculation (min) 47 min   Equipment Utilized During Treatment Gait belt   Activity Tolerance Patient tolerated treatment well;No increased pain   Behavior During Therapy WFL for tasks assessed/performed      Past Medical History:  Diagnosis Date  . Anxiety   . Arthritis   . Asthma    childhood asthma  . Cancer ([employer]) 7 years ago   lymphoma, [place] (recent)  . Chronic kidney disease   . Colon polyps   . GERD (gastroesophageal reflux disease)   . History of kidney stones   . Parkinson disease [facility])     Past Surgical History:  Procedure Laterality Date  . COLON SURGERY    . CYSTOSCOPY WITH LITHOLAPAXY N/A 11/14/2016   Procedure: CYSTOSCOPY WITH LITHOLAPAXY;  Surgeon: Hollice Espy, MD;  Location: [facility] ORS;  Service: Urology;  Laterality: N/A;  . EYE SURGERY Bilateral    Cataract Extraction with IOL  . HERNIA REPAIR  20 years  . MOHS SURGERY    . SMALL INTESTINE SURGERY     Per patient 7 years  . TRANSURETHRAL RESECTION OF PROSTATE N/A 11/14/2016   Procedure: TRANSURETHRAL RESECTION OF THE PROSTATE (TURP);  Surgeon: Hollice Espy, MD;  Location: [facility] ORS;  Service: Urology;  Laterality: N/A;    There were no vitals filed for this visit.      Subjective Assessment - 12/28/16 1032    Subjective Patient reports doing some calisthenics over the weekend. He states, "I was so tired and sore that the start of the week, I couldn't  really move around. I am just now starting to feel a little better." reports compliance with HEP;    Patient is accompained by: Family member  wife, [name]   Pertinent History Factors affecting rehab: high co-pay, progressive disease, supportive family   Limitations Sitting;Standing;Walking   How long can you sit comfortably? 1 hour   How long can you stand comfortably? 20 min.   How long can you walk comfortably? level surface with RW, greater distance   Patient Stated Goals improve balance and flexibility   Currently in Pain? Yes   Pain Score 6    Pain Location Knee   Pain Orientation Right;Left   Pain Descriptors / Indicators Aching;Dull;Sore   Pain Type Chronic pain   Pain Onset In the past 7 days   Pain Frequency Intermittent          TREATMENT Warm up on Nustep BUE/BLE level 2 x59mn with cues to keep steps per minute >80   Figure 8 walking around dyna disc with CGA for safety and cues to increase step length x4 reps without AD, x3 laps with RW; Pt demonstrates increased narrow base of support, short shuffled steps with forward flexed posture; Required tactile cues and verbal cues to increase hip flexion and to improve weight shift for better balance. Required mod VCs for walker placement  when negotiating with walker;   Side stepping on level surface and stepping over dyna disc with turn x4 laps with cues to increase step length for turning; Patient required 2 HHA; He was able to demonstrate better turn with less shuffle/freezing gait;   Patient required supervision with verbal cues for sitting to supine:  PT instructed patient in supine position x2 min to facilitate increased thoracic extension and cervical extension; Patient denies any pain when lying without pillow or support;  PT performed passive LE stretches: Hamstring stretch 15 sec hold x3 bilaterally; Passive knee to chest stretch 15 sec hold x2 bilaterally; Passive piriformis stretch 15 sec hold x2  bilaterally;  Patient in hooklying: Bridges 2 x15 Hip abduction/ER x15 Hip flexion march x15 bilaterally; Patient required min-moderate verbal/tactile cues for correct exercise technique including to increase ROM for better strengthening;   Patient able to transition supine to sitting independently with quicker movement and better positioning following stretches;                            PT Education - 12/28/16 1026    Education provided Yes   Education Details gait safety, posture re-ed, stretches/strengthening;    Person(s) Educated Patient;Spouse   Methods Explanation;Demonstration;Verbal cues   Comprehension Verbalized understanding;Returned demonstration;Verbal cues required;Tactile cues required;Need further instruction             PT Long Term Goals - 12/07/16 1029      PT LONG TERM GOAL #1   Title Patient will be independent in HEP in order to increase patient's ability to maintain gains achieved in therapy and assist with return to PLOF.    Time 8   Period Weeks   Status On-going     PT LONG TERM GOAL #2   Title Patient will decrease TUG test to <20 seconds in order to achieve an independently mobile status and decrease his risk for falls   Time 8   Period Weeks   Status Not Met     PT LONG TERM GOAL #3   Title Patient will increase overall strength to 4+/5 in both UE and LE in order to complete transfers safely with decreased risk for falls.    Time 8   Period Weeks   Status Partially Met     PT LONG TERM GOAL #4   Title Patient will increase his 10 m walk test time to >1.0 m/s in order to be a community ambulator at decreased risk for falls.    Time 8   Period Weeks   Status Partially Met               Plan - 12/28/16 1237    Clinical Impression Statement Patient continues to have difficulty with turning, especially when walking with RW; Instructed patient in side stepping with stepping over obstacle, patient able to  do with 2 HHA with better fluidity and less freezing of gait. Patient instructed in LE stretches and strengthening exercise. He exhibits better erect posture/head, and improve mobility following exercise. Patient would benefit from additional skilled PT intervention to improve balance, gait safety and reduce fall risk;    Rehab Potential Fair   Clinical Impairments Affecting Rehab Potential Positive factors: motivated, family support, previous good responses to PT; Negative factors: high co-pay, progressive disease; Clinical Presentation: Evolving-multiple falls   PT Frequency 1x / week   PT Duration 8 weeks   PT Treatment/Interventions ADLs/Self Care Home Management;Aquatic Therapy;Electrical Stimulation;Moist  Heat;DME Instruction;Gait training;Stair training;Functional mobility training;Therapeutic activities;Therapeutic exercise;Balance training;Neuromuscular re-education;Patient/family education;Manual techniques;Energy conservation   PT Next Visit Plan work on Animal nutritionist;    PT Home Exercise Plan advanced with figure 8 walking;    Consulted and Agree with Plan of Care Patient;Family member/caregiver      Patient will benefit from skilled therapeutic intervention in order to improve the following deficits and impairments:  Abnormal gait, Decreased balance, Decreased endurance, Decreased knowledge of use of DME, Decreased mobility, Decreased range of motion, Decreased safety awareness, Decreased strength, Difficulty walking, Hypomobility, Increased fascial restricitons, Impaired perceived functional ability, Impaired flexibility, Improper body mechanics, Postural dysfunction  Visit Diagnosis: Muscle weakness (generalized)  Other lack of coordination  Unsteadiness on feet  Abnormal posture     Problem List Patient Active Problem List   Diagnosis Date Noted  . BPH with urinary obstruction 11/14/2016  . Urinary frequency 10/10/2016  . Microscopic hematuria 10/10/2016  .  Parkinson's syndrome ([place]) 06/14/2016  . Arthritis 06/14/2016  . Depression 06/14/2016  . Heartburn 06/14/2016  . Urinary incontinence 06/14/2016  . Skin lesion 06/14/2016  . Dementia 06/14/2016  . BPH (benign prostatic hyperplasia) 06/14/2016  . Bilateral edema of lower extremity 06/14/2016    Trotter,Margaret PT, DPT 12/28/2016, 12:45 PM  [place] MAIN [facility] SERVICES [address] [place], [place], [address] Phone: [phone]   Fax:  [phone]  Name: Luke Durham MRN: [id number] Date of Birth: May 05, 1933

## 2016-12-28 NOTE — Patient Instructions (Signed)
OT TREATMENT     Neuro muscular re-education:  Pt. worked on grasping 2" sticks from the tabletop, and placing them in a pegboard. Pt. worked on removing them using resistive tweezers. Pt. required cues to maintain the pinch while extending the wrist to place them away.  Pt. worked on grasping, flipping, and stacking [place] style discs. Pt. required verbal cues for the proper technique, and increased time to complete.    Selfcare:  Pt. Worked on Estate agent tasks. Pt. Had good pen control, and a mature grasp on the pen. Pt. Required cues for angle of pen on the paper. Pt. formulated small written text, with decreasing legibility as the as the script, and text progressed. Pt. worked on various writing exercises, spacing, writing on the line, and formulating larger letters within a given space.    Manual Therapy:

## 2016-12-28 NOTE — Therapy (Signed)
[place] MAIN [facility] SERVICES [address] [place], [place], [address] Phone: [phone]   Fax:  [phone]  Occupational Therapy Treatment  Patient Details  Name: Luke Durham MRN: [id number] Date of Birth: 1933-06-09 Referring Provider: Marlyn Corporal  Encounter Date: 12/28/2016      OT End of Session - 12/28/16 1225    Visit Number 7   Number of Visits 12   Date for OT Re-Evaluation 01/04/17   Authorization Type Medicare visit 7   OT Start Time 1115   OT Stop Time 1200   OT Time Calculation (min) 45 min   Activity Tolerance Patient tolerated treatment well   Behavior During Therapy [facility] for tasks assessed/performed      Past Medical History:  Diagnosis Date  . Anxiety   . Arthritis   . Asthma    childhood asthma  . Cancer ([place]) 7 years ago   lymphoma, [place] (recent)  . Chronic kidney disease   . Colon polyps   . GERD (gastroesophageal reflux disease)   . History of kidney stones   . Parkinson disease [facility])     Past Surgical History:  Procedure Laterality Date  . COLON SURGERY    . CYSTOSCOPY WITH LITHOLAPAXY N/A 11/14/2016   Procedure: CYSTOSCOPY WITH LITHOLAPAXY;  Surgeon: Hollice Espy, MD;  Location: [facility] ORS;  Service: Urology;  Laterality: N/A;  . EYE SURGERY Bilateral    Cataract Extraction with IOL  . HERNIA REPAIR  20 years  . MOHS SURGERY    . SMALL INTESTINE SURGERY     Per patient 7 years  . TRANSURETHRAL RESECTION OF PROSTATE N/A 11/14/2016   Procedure: TRANSURETHRAL RESECTION OF THE PROSTATE (TURP);  Surgeon: Hollice Espy, MD;  Location: [facility] ORS;  Service: Urology;  Laterality: N/A;    There were no vitals filed for this visit.      Subjective Assessment - 12/28/16 1223    Subjective  Pt. reports his writing has been getting smaller, and less legible.   Patient is accompained by: Family member   Pertinent History Patient reports he was diagnosed with Parkinson's disease about 10 plus years ago.  He  reports a more recent decline in function in his daily activities in the last 6-12 months.  He has been seeing PT for the last couple months.   Patient Stated Goals Patient reports he wants to be able to do more for himself and around the house.    Currently in Pain? No/denies                      OT Treatments/Exercises (OP) - 12/28/16 1634      ADLs   ADL Comments Pt. Worked on Estate agent tasks. Pt. Had good pen control, and a mature grasp on the pen. Pt. Required cues for angle of pen on the paper. Pt. formulated small written text, with decreasing legibility as the as the script, and text progressed. Pt. worked on various writing exercises, spacing, writing on the line, and formulating larger letters within a given space.       Fine Motor Coordination   Other Fine Motor Exercises Pt. worked on grasping 2" sticks from the tabletop, and placing them in a pegboard. Pt. worked on removing them using resistive tweezers. Pt. required cues to maintain the pinch while extending the wrist to place them away.  Pt. worked on grasping, flipping, and stacking [place] style discs. Pt. required verbal cues for the proper technique, and increased time  to complete.                OT Education - 12/28/16 1616    Education provided Yes             OT Long Term Goals - 11/09/16 1738      OT LONG TERM GOAL #1   Title Patient will demonstrate increase right hand grip to be able to cut food with modified independence.    Baseline unable at eval   Time 12   Period Weeks   Status On-going     OT LONG TERM GOAL #2   Title Patient will complete donning long sleeve shirt with modified independence.   Baseline requires assist with long sleeves, can perform short sleeves.   Time 12   Period Weeks   Status On-going     OT LONG TERM GOAL #3   Title Patient will improve coordination to manage buttons on clothing with occasional assistance only.   Baseline requires assist with  buttons each time   Time 12   Period Weeks   Status On-going     OT LONG TERM GOAL #4   Title Patient will complete managing pants after toileting with modified independence.    Baseline assist most days    Time 12   Period Weeks   Status On-going     OT LONG TERM GOAL #5   Title Patient will improve hand strength bilaterally to be able to don socks with modified independence.    Baseline unable at evaluation   Time 12   Period Weeks   Status On-going               Plan - 12/28/16 1226    Clinical Impression Statement Pt. wirfe was provided with a buttonhook.  Pt. wife reports that she knows how to use the buttonhook. Pt. continues to present with weakness, impaired [facility] skills, decreased writing legibility, and decreased letter size. Pt. continues to work on improving strength, [facility] skills, maipulation, and translatory movements of the hand needed for ADLS, IADLs, and writing.   Rehab Potential Good   Clinical Impairments Affecting Rehab Potential positive: family support, negative: level of motivation, progressive disease   OT Frequency 1x / week   OT Treatment/Interventions Self-care/ADL training;Moist Heat;DME and/or AE instruction;Patient/family education;Therapeutic exercises;Balance training;Therapeutic exercise;Therapeutic activities;Neuromuscular education;Functional Mobility Training;Manual Therapy   Consulted and Agree with Plan of Care Patient;Family member/caregiver   Family Member Consulted wife      Patient will benefit from skilled therapeutic intervention in order to improve the following deficits and impairments:  Abnormal gait, Decreased coordination, Decreased range of motion, Difficulty walking, Decreased endurance, Decreased safety awareness, Decreased activity tolerance, Decreased balance, Impaired UE functional use, Pain, Decreased mobility, Decreased strength  Visit Diagnosis: Other lack of coordination    Problem List Patient Active Problem List    Diagnosis Date Noted  . BPH with urinary obstruction 11/14/2016  . Urinary frequency 10/10/2016  . Microscopic hematuria 10/10/2016  . Parkinson's syndrome ([place]) 06/14/2016  . Arthritis 06/14/2016  . Depression 06/14/2016  . Heartburn 06/14/2016  . Urinary incontinence 06/14/2016  . Skin lesion 06/14/2016  . Dementia 06/14/2016  . BPH (benign prostatic hyperplasia) 06/14/2016  . Bilateral edema of lower extremity 06/14/2016    Harrel Carina, MS, OTR/L 12/28/2016, 5:19 PM  [place] MAIN [facility] SERVICES [address] [place], [place], [address] Phone: [phone]   Fax:  [phone]  Name: Zayed Griffie MRN: [id number] Date of  Birth: 06-27-1933

## 2017-01-04 ENCOUNTER — Ambulatory Visit: Payer: Medicare Other | Admitting: Physician Assistant

## 2017-01-04 ENCOUNTER — Ambulatory Visit: Payer: Medicare Other | Admitting: Occupational Therapy

## 2017-01-04 ENCOUNTER — Encounter: Payer: Self-pay | Admitting: Physical Therapy

## 2017-01-04 ENCOUNTER — Ambulatory Visit: Payer: Medicare Other | Admitting: Physical Therapy

## 2017-01-04 ENCOUNTER — Encounter: Payer: Self-pay | Admitting: Occupational Therapy

## 2017-01-04 VITALS — BP 104/66 | HR 65

## 2017-01-04 DIAGNOSIS — R293 Abnormal posture: Secondary | ICD-10-CM

## 2017-01-04 DIAGNOSIS — M6281 Muscle weakness (generalized): Secondary | ICD-10-CM

## 2017-01-04 DIAGNOSIS — R2681 Unsteadiness on feet: Secondary | ICD-10-CM

## 2017-01-04 DIAGNOSIS — R278 Other lack of coordination: Secondary | ICD-10-CM

## 2017-01-04 NOTE — Therapy (Signed)
[place] MAIN [facility] SERVICES [address] [place], [place], [address] Phone: [phone]   Fax:  [phone]  Physical Therapy Treatment  Patient Details  Name: Luke Durham MRN: [id number] Date of Birth: 02-07-1933 Referring Provider: Marlyn Corporal  Encounter Date: 01/04/2017      PT End of Session - 01/04/17 1019    Visit Number 17   Number of Visits 33   Date for PT Re-Evaluation 02/01/17   Authorization Type G code 7   Authorization Time Period 10   PT Start Time 1017   PT Stop Time 1100   PT Time Calculation (min) 43 min   Equipment Utilized During Treatment Gait belt   Activity Tolerance Patient tolerated treatment well;No increased pain   Behavior During Therapy WFL for tasks assessed/performed      Past Medical History:  Diagnosis Date  . Anxiety   . Arthritis   . Asthma    childhood asthma  . Cancer ([place]) 7 years ago   lymphoma, [place] (recent)  . Chronic kidney disease   . Colon polyps   . GERD (gastroesophageal reflux disease)   . History of kidney stones   . Parkinson disease [facility])     Past Surgical History:  Procedure Laterality Date  . COLON SURGERY    . CYSTOSCOPY WITH LITHOLAPAXY N/A 11/14/2016   Procedure: CYSTOSCOPY WITH LITHOLAPAXY;  Surgeon: Hollice Espy, MD;  Location: [facility] ORS;  Service: Urology;  Laterality: N/A;  . EYE SURGERY Bilateral    Cataract Extraction with IOL  . HERNIA REPAIR  20 years  . MOHS SURGERY    . SMALL INTESTINE SURGERY     Per patient 7 years  . TRANSURETHRAL RESECTION OF PROSTATE N/A 11/14/2016   Procedure: TRANSURETHRAL RESECTION OF THE PROSTATE (TURP);  Surgeon: Hollice Espy, MD;  Location: [facility] ORS;  Service: Urology;  Laterality: N/A;    Vitals:   01/04/17 1022  BP: 104/66  Pulse: 65  SpO2: 100%        Subjective Assessment - 01/04/17 1022    Subjective Pt reports he is doing well today with no new complaints or concerns.  He denies any falls.  He reports chronic  R shoulder pain.   Patient is accompained by: Family member  wife, [name]   Pertinent History Factors affecting rehab: high co-pay, progressive disease, supportive family   Limitations Sitting;Standing;Walking   How long can you sit comfortably? 1 hour   How long can you stand comfortably? 20 min.   How long can you walk comfortably? level surface with RW, greater distance   Patient Stated Goals improve balance and flexibility   Currently in Pain? Yes   Pain Score 5    Pain Location Shoulder   Pain Orientation Right   Pain Descriptors / Indicators Aching   Pain Type Chronic pain   Pain Onset More than a month ago   Multiple Pain Sites No       TREATMENT   Therapeutic Exercise:  Figure 8 walking around dynadisc with RW with cues to take bigger steps around dynadisc to avoid shuffling. x5 minutes. Freezing and shuffling gait noted with this exercise.   Alternating forward stepping with tape on floor as cue to avoid freezing. Focus on taking big steps. X20 each LE. Cues to keep a forward gaze which pt was unable to maintain without max verbal cues.   Alternating hip F/marching in standing 2x20 in // bars with BUEs supported with cues to maintain forward gaze  looking at self in mirror throughout exercise and upright posture.   Side stepping over  foam roll with BUEs supported on // bars. x10 each direction. Poor motor planning with this initially and required verbal cues for proper foot placement. Cues following this for upright posture and forward gaze.   Forward stepping taking a big step and reaching up with contralateral UE with cone in hand with cues to exaggerate both step and reaching as far as he can. x30 each side. Pt denies R shoulder pain with this.   Standing in #1 spot in 4 square layout with BUEs supported via HHA mod. Pt stepping with R foot to #4 spot to open body to turn. Repeated this starting in #2 spot and opening to the L with L foot stepping into the #3 spot.    Ambulating in gym x200 ft with RW with cues for taking bigger steps with upright posture and forward gaze. No freezing noted but mild shuffling noted during turns.               PT Education - 01/04/17 1018    Education provided Yes   Education Details Exercise technique   Person(s) Educated Patient   Methods Explanation;Demonstration;Verbal cues   Comprehension Verbalized understanding;Returned demonstration;Need further instruction             PT Long Term Goals - 12/07/16 1029      PT LONG TERM GOAL #1   Title Patient will be independent in HEP in order to increase patient's ability to maintain gains achieved in therapy and assist with return to PLOF.    Time 8   Period Weeks   Status On-going     PT LONG TERM GOAL #2   Title Patient will decrease TUG test to <20 seconds in order to achieve an independently mobile status and decrease his risk for falls   Time 8   Period Weeks   Status Not Met     PT LONG TERM GOAL #3   Title Patient will increase overall strength to 4+/5 in both UE and LE in order to complete transfers safely with decreased risk for falls.    Time 8   Period Weeks   Status Partially Met     PT LONG TERM GOAL #4   Title Patient will increase his 10 m walk test time to >1.0 m/s in order to be a community ambulator at decreased risk for falls.    Time 8   Period Weeks   Status Partially Met               Plan - 01/04/17 1049    Clinical Impression Statement Pt demonstrates freezing and shuffled gait with weaving around cones and thus therapeutic exercises were focused on increasing step length.  He additionally presents with flexed posture and looking to the floor with all activities and emphasis was placed on upright posture with forward gaze during dynamic activities today.  Noted improvements in each of these impairments when ambulating in gym at end of the session.  Pt will benefit from continued skilled PT interventions for  improved gait mechanics and QOL.   Rehab Potential Fair   Clinical Impairments Affecting Rehab Potential Positive factors: motivated, family support, previous good responses to PT; Negative factors: high co-pay, progressive disease; Clinical Presentation: Evolving-multiple falls   PT Frequency 1x / week   PT Duration 8 weeks   PT Treatment/Interventions ADLs/Self Care Home Management;Aquatic Therapy;Electrical Stimulation;Moist Heat;DME Instruction;Gait training;Stair training;Functional mobility  training;Therapeutic activities;Therapeutic exercise;Balance training;Neuromuscular re-education;Patient/family education;Manual techniques;Energy conservation   PT Next Visit Plan work on Animal nutritionist;    PT Home Exercise Plan advanced with figure 8 walking;    Consulted and Agree with Plan of Care Patient;Family member/caregiver      Patient will benefit from skilled therapeutic intervention in order to improve the following deficits and impairments:  Abnormal gait, Decreased balance, Decreased endurance, Decreased knowledge of use of DME, Decreased mobility, Decreased range of motion, Decreased safety awareness, Decreased strength, Difficulty walking, Hypomobility, Increased fascial restricitons, Impaired perceived functional ability, Impaired flexibility, Improper body mechanics, Postural dysfunction  Visit Diagnosis: Muscle weakness (generalized)  Other lack of coordination  Unsteadiness on feet  Abnormal posture     Problem List Patient Active Problem List   Diagnosis Date Noted  . BPH with urinary obstruction 11/14/2016  . Urinary frequency 10/10/2016  . Microscopic hematuria 10/10/2016  . Parkinson's syndrome ([place]) 06/14/2016  . Arthritis 06/14/2016  . Depression 06/14/2016  . Heartburn 06/14/2016  . Urinary incontinence 06/14/2016  . Skin lesion 06/14/2016  . Dementia 06/14/2016  . BPH (benign prostatic hyperplasia) 06/14/2016  . Bilateral edema of lower extremity  06/14/2016    Collie Siad PT, DPT 01/04/2017, 11:01 AM  [place] MAIN [facility] SERVICES [address] [place], [place], [address] Phone: [phone]   Fax:  [phone]  Name: Luke Durham MRN: [id number] Date of Birth: [birth year]

## 2017-01-06 ENCOUNTER — Ambulatory Visit: Payer: Medicare Other | Admitting: Physician Assistant

## 2017-01-06 NOTE — Therapy (Signed)
[place] MAIN [facility] SERVICES [address] [place], [place], [address] Phone: [phone]   Fax:  [phone]  Occupational Therapy Treatment  Patient Details  Name: Luke Durham MRN: [id number] Date of Birth: 1932/11/03 Referring Provider: Marlyn Corporal  Encounter Date: 01/04/2017      OT End of Session - 01/06/17 1611    Visit Number 8   Number of Visits 12   Date for OT Re-Evaluation 01/04/17   Authorization Type Medicare visit 8   OT Start Time 1101   OT Stop Time 1150   OT Time Calculation (min) 49 min   Activity Tolerance Patient tolerated treatment well   Behavior During Therapy [facility] for tasks assessed/performed      Past Medical History:  Diagnosis Date  . Anxiety   . Arthritis   . Asthma    childhood asthma  . Cancer ([place]) 7 years ago   lymphoma, [place] (recent)  . Chronic kidney disease   . Colon polyps   . GERD (gastroesophageal reflux disease)   . History of kidney stones   . Parkinson disease [facility])     Past Surgical History:  Procedure Laterality Date  . COLON SURGERY    . CYSTOSCOPY WITH LITHOLAPAXY N/A 11/14/2016   Procedure: CYSTOSCOPY WITH LITHOLAPAXY;  Surgeon: Hollice Espy, MD;  Location: [facility] ORS;  Service: Urology;  Laterality: N/A;  . EYE SURGERY Bilateral    Cataract Extraction with IOL  . HERNIA REPAIR  20 years  . MOHS SURGERY    . SMALL INTESTINE SURGERY     Per patient 7 years  . TRANSURETHRAL RESECTION OF PROSTATE N/A 11/14/2016   Procedure: TRANSURETHRAL RESECTION OF THE PROSTATE (TURP);  Surgeon: Hollice Espy, MD;  Location: [facility] ORS;  Service: Urology;  Laterality: N/A;    There were no vitals filed for this visit.      Subjective Assessment - 01/06/17 1608    Subjective  Patient reports he would like to continue with working on his handwriting as well as fine motor coordination tasks.     Pertinent History Patient reports he was diagnosed with Parkinson's disease about 10 plus years ago.   He reports a more recent decline in function in his daily activities in the last 6-12 months.  He has been seeing PT for the last couple months.   Patient Stated Goals Patient reports he wants to be able to do more for himself and around the house.    Currently in Pain? No/denies   Pain Score 0-No pain                      OT Treatments/Exercises (OP) - 01/06/17 1616      ADLs   ADL Comments Patient seen for handwriting tasks this date with focus on size and formation of letters as well as prehension pattern to hold and stabilize pen.  Patient benefits from performance of large scale hand flicks prior to handwriting tasks and verbal/tactile cues for parameters of letter size.      Fine Motor Coordination   Other Fine Motor Exercises Patient seen for attempts at performing manipulation of grooved pegs however patient had maximal difficulty likely due to poor vision and proprioceptive input at fingers to place into specific slots.  Patient able to perfrom manipulation of 1/2 to 3/4 inch object manipulation and turning/flipping with cues for movement patterns and isolated finger movements for index and middle along with thumb on the right.  OT Education - 01/06/17 1610    Education provided Yes   Education Details fine motor coordination tasks, size and formation of letters for hand writing.   Person(s) Educated Patient   Methods Explanation;Demonstration;Verbal cues   Comprehension Verbal cues required;Returned demonstration;Verbalized understanding             OT Long Term Goals - 11/09/16 1738      OT LONG TERM GOAL #1   Title Patient will demonstrate increase right hand grip to be able to cut food with modified independence.    Baseline unable at eval   Time 12   Period Weeks   Status On-going     OT LONG TERM GOAL #2   Title Patient will complete donning long sleeve shirt with modified independence.   Baseline requires assist with long  sleeves, can perform short sleeves.   Time 12   Period Weeks   Status On-going     OT LONG TERM GOAL #3   Title Patient will improve coordination to manage buttons on clothing with occasional assistance only.   Baseline requires assist with buttons each time   Time 12   Period Weeks   Status On-going     OT LONG TERM GOAL #4   Title Patient will complete managing pants after toileting with modified independence.    Baseline assist most days    Time 12   Period Weeks   Status On-going     OT LONG TERM GOAL #5   Title Patient will improve hand strength bilaterally to be able to don socks with modified independence.    Baseline unable at evaluation   Time 12   Period Weeks   Status On-going               Plan - 01/06/17 1611    Clinical Impression Statement Patient continues to work towards goals to improve bilateral UE coordination, strength and use in daily self care tasks.  Patient has shown improved participation at home with self care but continues to struggle with activities regarding speed, dexterity and size of movements.  He would likely benefit from LSVT BIG program in which intensity helps to drive motor patterns however he is not sure he can commit to 4 days a week.  Will reassess patient next session, update goals and determine plan of care for future visits.    Rehab Potential Good   Clinical Impairments Affecting Rehab Potential positive: family support, negative: level of motivation, progressive disease   OT Frequency 1x / week   OT Duration 12 weeks   OT Treatment/Interventions Self-care/ADL training;Moist Heat;DME and/or AE instruction;Patient/family education;Therapeutic exercises;Balance training;Therapeutic exercise;Therapeutic activities;Neuromuscular education;Functional Mobility Training;Manual Therapy   Consulted and Agree with Plan of Care Patient;Family member/caregiver   Family Member Consulted wife      Patient will benefit from skilled  therapeutic intervention in order to improve the following deficits and impairments:  Abnormal gait, Decreased coordination, Decreased range of motion, Difficulty walking, Decreased endurance, Decreased safety awareness, Decreased activity tolerance, Decreased balance, Impaired UE functional use, Pain, Decreased mobility, Decreased strength  Visit Diagnosis: Muscle weakness (generalized)  Other lack of coordination    Problem List Patient Active Problem List   Diagnosis Date Noted  . BPH with urinary obstruction 11/14/2016  . Urinary frequency 10/10/2016  . Microscopic hematuria 10/10/2016  . Parkinson's syndrome ([place]) 06/14/2016  . Arthritis 06/14/2016  . Depression 06/14/2016  . Heartburn 06/14/2016  . Urinary incontinence 06/14/2016  . Skin lesion 06/14/2016  .  Dementia 06/14/2016  . BPH (benign prostatic hyperplasia) 06/14/2016  . Bilateral edema of lower extremity 06/14/2016   Achilles Dunk, OTR/L, CLT Lovett,Amy 01/06/2017, 4:23 PM  [place] MAIN [facility] SERVICES [address] [place], [place], [address] Phone: [phone]   Fax:  [phone]  Name: Nathanie Ottley MRN: [id number] Date of Birth: 07/11/1933

## 2017-01-11 ENCOUNTER — Ambulatory Visit: Payer: Medicare Other | Admitting: Physical Therapy

## 2017-01-11 ENCOUNTER — Encounter: Payer: Self-pay | Admitting: Occupational Therapy

## 2017-01-11 ENCOUNTER — Ambulatory Visit: Payer: Medicare Other | Admitting: Occupational Therapy

## 2017-01-11 ENCOUNTER — Encounter: Payer: Self-pay | Admitting: Physical Therapy

## 2017-01-11 DIAGNOSIS — R278 Other lack of coordination: Secondary | ICD-10-CM

## 2017-01-11 DIAGNOSIS — M6281 Muscle weakness (generalized): Secondary | ICD-10-CM

## 2017-01-11 DIAGNOSIS — R293 Abnormal posture: Secondary | ICD-10-CM

## 2017-01-11 DIAGNOSIS — R2681 Unsteadiness on feet: Secondary | ICD-10-CM

## 2017-01-11 NOTE — Patient Instructions (Addendum)
   Copyright  VHI. All rights reserved. Knee to Chest (Flexion)   Pull knee toward chest. Feel stretch in lower back or buttock area. Breathing deeply, Hold __15__ seconds. Repeat with other knee. Repeat _2-3___ times. Do _2-3___ sessions per day.  http://gt2.exer.us/225   Copyright  VHI. All rights reserved.   Lower Trunk Rotation Stretch  Lying on back with knees bent, Keeping back flat and feet together, rotate knees side to side slowly and in pain free range of motion.  Hold _2___ seconds. Repeat for 1-2 minutes. Do __1__ sets per session. Do __2-3__ sessions per day.  http://orth.exer.us/122   Copyright  VHI. All rights reserved.   Bridge   Lie back, legs bent. Inhale, pressing hips up.  Exhale, rolling down along spine from top. Repeat _10___ times. Do __1-2__ sessions per day.  Copyright  VHI. All rights reserved.     Hamstring Stretch    With other leg bent, foot flat, grasp right leg and slowly try to straighten knee. Hold _5___ seconds. Repeat ___3-5_ times. Do _2___ sessions per day.  http://gt2.exer.us/279   Copyright  VHI. All rights reserved.

## 2017-01-11 NOTE — Therapy (Signed)
[place] MAIN [facility] SERVICES [address] [place], [place], [address] Phone: [phone]   Fax:  [phone]  Physical Therapy Treatment  Patient Details  Name: Luke Durham MRN: [id number] Date of Birth: December 31, 1932 Referring Provider: Marlyn Corporal  Encounter Date: 01/11/2017      PT End of Session - 01/11/17 1125    Visit Number 18   Number of Visits 33   Date for PT Re-Evaluation 02/01/17   Authorization Type G code 8   Authorization Time Period 10   PT Start Time 1015   PT Stop Time 1112   PT Time Calculation (min) 57 min   Equipment Utilized During Treatment Gait belt   Activity Tolerance Patient tolerated treatment well;No increased pain   Behavior During Therapy WFL for tasks assessed/performed      Past Medical History:  Diagnosis Date  . Anxiety   . Arthritis   . Asthma    childhood asthma  . Cancer ([place]) 7 years ago   lymphoma, [place] (recent)  . Chronic kidney disease   . Colon polyps   . GERD (gastroesophageal reflux disease)   . History of kidney stones   . Parkinson disease [facility])     Past Surgical History:  Procedure Laterality Date  . COLON SURGERY    . CYSTOSCOPY WITH LITHOLAPAXY N/A 11/14/2016   Procedure: CYSTOSCOPY WITH LITHOLAPAXY;  Surgeon: Hollice Espy, MD;  Location: [facility] ORS;  Service: Urology;  Laterality: N/A;  . EYE SURGERY Bilateral    Cataract Extraction with IOL  . HERNIA REPAIR  20 years  . MOHS SURGERY    . SMALL INTESTINE SURGERY     Per patient 7 years  . TRANSURETHRAL RESECTION OF PROSTATE N/A 11/14/2016   Procedure: TRANSURETHRAL RESECTION OF THE PROSTATE (TURP);  Surgeon: Hollice Espy, MD;  Location: [facility] ORS;  Service: Urology;  Laterality: N/A;    There were no vitals filed for this visit.      Subjective Assessment - 01/11/17 1025    Subjective Patient repots doing well; no new falls; his wife reports that he made a good turn this morning with his walker; Patient reports still  having some freezing with walking especially with turns;    Patient is accompained by: Family member  wife, [name]   Pertinent History Factors affecting rehab: high co-pay, progressive disease, supportive family   Limitations Sitting;Standing;Walking   How long can you sit comfortably? 1 hour   How long can you stand comfortably? 20 min.   How long can you walk comfortably? level surface with RW, greater distance   Patient Stated Goals improve balance and flexibility   Currently in Pain? Yes   Pain Score 4    Pain Location Shoulder   Pain Orientation Right   Pain Descriptors / Indicators Aching   Pain Type Chronic pain   Pain Onset More than a month ago              TREATMENT Warm up on Nustep BUE/BLE level 2 x68mn with cues to keep steps per minute >80 for cardiovascular training;    Figure 8 walking around dyna disc with CGA for safety and cues to increase step length x4 reps without AD, x3 laps with RW; Pt demonstrates increased narrow base of support, short shuffled steps with forward flexed posture especially with walker as compared to without; Required tactile cues and verbal cues to increase hip flexion and to improve weight shift for better balance. Required mod VCs for walker  placement when negotiating with walker;   Standing on line, stepping in forward diagonal towards target to facilitate increased step length and wide base of support, alternating to facilitate weight shift x15 each direction with CGA and mod Vcs for foot placement and weight shift for better foot clearance;  Gait around gym following stretches x160 feet with supervision with RW with patient demonstrating improved reciprocal gait pattern, erect posture and better turning with walker; educated patient in importance of stretches and mobility exercise to facilitate better gait and less freezing episodes.   Patient required supervision with verbal cues for sitting to supine:  PT instructed patient in  supine position x2 min to facilitate increased thoracic extension and cervical extension; Patient denies any pain when lying without pillow or support;  PT educated patient in stretches with handout for increased compliance: Hamstring stretch neural stretch 5 sec hold x3 each LE; Passive knee to chest stretch 15 sec hold x2 bilaterally; Lumbar trunk rotation AROM x10 reps with cues to increase ROM for better trunk stretch;  Patient in hooklying: Bridges 2 x15 with cues to increase ROM and to count for increased challenge with breath support and cognition;  Sidelying: Clamshell AROM x10 bilaterally with tactile cues to increase ROM for better hip strengthening; patient very slow with rolling and with hip abduction due to bradykinesia;  Instructed patient in rolling side to side to facilitate increased trunk rotation and better mobility x5 each direction  Patient able to transition supine to sitting independently with quicker movement and better positioning following stretches;                       PT Education - 01/11/17 1124    Education provided Yes   Education Details gait training, stretches, HEP   Person(s) Educated Patient;Spouse   Methods Explanation;Demonstration;Verbal cues;Handout   Comprehension Verbalized understanding;Returned demonstration;Verbal cues required;Tactile cues required;Need further instruction             PT Long Term Goals - 12/07/16 1029      PT LONG TERM GOAL #1   Title Patient will be independent in HEP in order to increase patient's ability to maintain gains achieved in therapy and assist with return to PLOF.    Time 8   Period Weeks   Status On-going     PT LONG TERM GOAL #2   Title Patient will decrease TUG test to <20 seconds in order to achieve an independently mobile status and decrease his risk for falls   Time 8   Period Weeks   Status Not Met     PT LONG TERM GOAL #3   Title Patient will increase overall  strength to 4+/5 in both UE and LE in order to complete transfers safely with decreased risk for falls.    Time 8   Period Weeks   Status Partially Met     PT LONG TERM GOAL #4   Title Patient will increase his 10 m walk test time to >1.0 m/s in order to be a community ambulator at decreased risk for falls.    Time 8   Period Weeks   Status Partially Met               Plan - 01/11/17 1127    Clinical Impression Statement Patient continues to have difficulty with freezing and shuffled gait with turns and walking around obstacles. He demonstrates better step length without AD but then when turning is unsteady and requires min  A. Patient instructed in advanced stretches to increase mobility at home. He is able to demonstrate erect posture and better step length following stretches and movement on mat table with increased flexibility; Educated patient and caregiver that patient should be moving more at home to improve mobility and reduce freezing/fall risk; Patient would benefit from additional skilled PT intervention to improve balance, gait safety and LE strengthening;    Rehab Potential Fair   Clinical Impairments Affecting Rehab Potential Positive factors: motivated, family support, previous good responses to PT; Negative factors: high co-pay, progressive disease; Clinical Presentation: Evolving-multiple falls   PT Frequency 1x / week   PT Duration 8 weeks   PT Treatment/Interventions ADLs/Self Care Home Management;Aquatic Therapy;Electrical Stimulation;Moist Heat;DME Instruction;Gait training;Stair training;Functional mobility training;Therapeutic activities;Therapeutic exercise;Balance training;Neuromuscular re-education;Patient/family education;Manual techniques;Energy conservation   PT Next Visit Plan work on Animal nutritionist;    PT Home Exercise Plan advanced with stretches;    Consulted and Agree with Plan of Care Patient;Family member/caregiver      Patient will benefit  from skilled therapeutic intervention in order to improve the following deficits and impairments:  Abnormal gait, Decreased balance, Decreased endurance, Decreased knowledge of use of DME, Decreased mobility, Decreased range of motion, Decreased safety awareness, Decreased strength, Difficulty walking, Hypomobility, Increased fascial restricitons, Impaired perceived functional ability, Impaired flexibility, Improper body mechanics, Postural dysfunction  Visit Diagnosis: Muscle weakness (generalized)  Other lack of coordination  Unsteadiness on feet  Abnormal posture     Problem List Patient Active Problem List   Diagnosis Date Noted  . BPH with urinary obstruction 11/14/2016  . Urinary frequency 10/10/2016  . Microscopic hematuria 10/10/2016  . Parkinson's syndrome ([place]) 06/14/2016  . Arthritis 06/14/2016  . Depression 06/14/2016  . Heartburn 06/14/2016  . Urinary incontinence 06/14/2016  . Skin lesion 06/14/2016  . Dementia 06/14/2016  . BPH (benign prostatic hyperplasia) 06/14/2016  . Bilateral edema of lower extremity 06/14/2016    Trotter,Margaret PT, DPT 01/11/2017, 11:30 AM  [place] MAIN [facility] SERVICES [address] [place], [place], [address] Phone: [phone]   Fax:  [phone]  Name: Luke Durham MRN: [id number] Date of Birth: [birth year]

## 2017-01-12 NOTE — Therapy (Signed)
[place] MAIN [facility] SERVICES [address] [place], [place], [address] Phone: [phone]   Fax:  [phone]  Occupational Therapy Treatment/Recertification  Patient Details  Name: Luke Durham MRN: [id number] Date of Birth: Sep 08, 1933 Referring Provider: Marlyn Corporal  Encounter Date: 01/11/2017      OT End of Session - 01/11/17 2054    Visit Number 9   Number of Visits 12   Date for OT Re-Evaluation 03/29/17   Authorization Type Medicare visit 9   OT Start Time 0932   OT Stop Time 1020   OT Time Calculation (min) 48 min   Activity Tolerance Patient tolerated treatment well   Behavior During Therapy [facility] for tasks assessed/performed      Past Medical History:  Diagnosis Date  . Anxiety   . Arthritis   . Asthma    childhood asthma  . Cancer ([place]) 7 years ago   lymphoma, [place] (recent)  . Chronic kidney disease   . Colon polyps   . GERD (gastroesophageal reflux disease)   . History of kidney stones   . Parkinson disease [facility])     Past Surgical History:  Procedure Laterality Date  . COLON SURGERY    . CYSTOSCOPY WITH LITHOLAPAXY N/A 11/14/2016   Procedure: CYSTOSCOPY WITH LITHOLAPAXY;  Surgeon: Hollice Espy, MD;  Location: [facility] ORS;  Service: Urology;  Laterality: N/A;  . EYE SURGERY Bilateral    Cataract Extraction with IOL  . HERNIA REPAIR  20 years  . MOHS SURGERY    . SMALL INTESTINE SURGERY     Per patient 7 years  . TRANSURETHRAL RESECTION OF PROSTATE N/A 11/14/2016   Procedure: TRANSURETHRAL RESECTION OF THE PROSTATE (TURP);  Surgeon: Hollice Espy, MD;  Location: [facility] ORS;  Service: Urology;  Laterality: N/A;    There were no vitals filed for this visit.      Subjective Assessment - 01/11/17 2052    Subjective  Patient reports he is planning to go to see the neurologist tomorrow.  Discussed the details of LSVT BIG program and how therapist recommends he participate but will need to committ to 4 days a week.     Patient is accompained by: Family member   Pertinent History Patient reports he was diagnosed with Parkinson's disease about 10 plus years ago.  He reports a more recent decline in function in his daily activities in the last 6-12 months.  He has been seeing PT for the last couple months.   Patient Stated Goals Patient reports he wants to be able to do more for himself and around the house.    Currently in Pain? No/denies   Pain Score 0-No pain                      OT Treatments/Exercises (OP) - 01/12/17 1036      ADLs   ADL Comments Reassessment of ADL status and goals updated to reflect progress.  Patient continues to require some assistance from wife but has been working more towards dressing himself more consistently in recent weeks.  He continues to require assist with buttons and is able to demonstrate performance however gets easily frustrated with this task and requests help.  Wife has been educated on use of buttonhook to assist with this task since she has arthritis and she has since purchased a buttonhook for home use. Patient responds well to cues with functional mobility skills.      Fine Motor Coordination   Other Fine  Motor Exercises Patient seen for manipulation of small objects 1/2 inch size or larger with cues for prehension patterns, speed and dexterity.  He dropped one item this date with assist to pick up.  Manipulation of nuts and bolts on elevated surface with emphasis on thumb finger combinations and cues for finger movements.      Neurological Re-education Exercises   Other Exercises 1 Patient seen for BUE strengthening with red theraband for shoulder flexion, ABD/ADD, diagonal patterns, elbow flexion extension for 10 reps for 2 sets each. Cues provided for form and technique as well as modeling from therapist.                 OT Education - 01/11/17 2054    Education provided Yes   Education Details LSVT BIG program, HEP   Person(s) Educated  Patient;Spouse   Methods Explanation;Demonstration;Verbal cues   Comprehension Verbal cues required;Returned demonstration;Verbalized understanding             OT Long Term Goals - 01/12/17 1035      OT LONG TERM GOAL #1   Title Patient will demonstrate increase right hand grip to be able to cut food with modified independence.    Baseline unable at eval   Time 12   Period Weeks   Status On-going     OT LONG TERM GOAL #2   Title Patient will complete donning long sleeve shirt with modified independence.   Baseline requires assist with long sleeves, can perform short sleeves.   Time 12   Period Weeks   Status On-going     OT LONG TERM GOAL #3   Title Patient will improve coordination to manage buttons on clothing with occasional assistance only.   Baseline requires assist with buttons each time   Time 12   Period Weeks   Status On-going     OT LONG TERM GOAL #4   Title Patient will complete managing pants after toileting with modified independence.    Baseline assist most days    Time 12   Period Weeks   Status On-going     OT LONG TERM GOAL #5   Title Patient will improve hand strength bilaterally to be able to don socks with modified independence.    Baseline unable at evaluation   Time 12   Period Weeks   Status On-going               Plan - 01/11/17 2055    Clinical Impression Statement Patient has continued to make good progress towards goals and has had noted improvements in ability to complete self care tasks particulary dressing skills at home.  He is still slow in his approach and continues to get easily frustrated with manipulation of buttons.  He has shown improvements in his handwriting with the use of cues for letter size and formation as well as implementation of hand flicks.  We have discussed LSVT BIG program and he would like be a good candidate but he has reservations about coming to therapy 4 days a week.  But he is still willing to consider  it for the future.  Continue to work towards goals to improve performance in necessary daily tasks at home and in the community.  He continues to benefit from skilled OT intervention to improve independence.    Rehab Potential Good   Clinical Impairments Affecting Rehab Potential positive: family support, negative: level of motivation, progressive disease   OT Frequency 1x / week   OT  Duration 12 weeks   OT Treatment/Interventions Self-care/ADL training;Moist Heat;DME and/or AE instruction;Patient/family education;Therapeutic exercises;Balance training;Therapeutic exercise;Therapeutic activities;Neuromuscular education;Functional Mobility Training;Manual Therapy   Consulted and Agree with Plan of Care Patient;Family member/caregiver      Patient will benefit from skilled therapeutic intervention in order to improve the following deficits and impairments:  Abnormal gait, Decreased coordination, Decreased range of motion, Difficulty walking, Decreased endurance, Decreased safety awareness, Decreased activity tolerance, Decreased balance, Impaired UE functional use, Pain, Decreased mobility, Decreased strength  Visit Diagnosis: Muscle weakness (generalized)  Other lack of coordination    Problem List Patient Active Problem List   Diagnosis Date Noted  . BPH with urinary obstruction 11/14/2016  . Urinary frequency 10/10/2016  . Microscopic hematuria 10/10/2016  . Parkinson's syndrome ([place]) 06/14/2016  . Arthritis 06/14/2016  . Depression 06/14/2016  . Heartburn 06/14/2016  . Urinary incontinence 06/14/2016  . Skin lesion 06/14/2016  . Dementia 06/14/2016  . BPH (benign prostatic hyperplasia) 06/14/2016  . Bilateral edema of lower extremity 06/14/2016   Achilles Dunk, OTR/L, CLT  Lovett,Amy 01/12/2017, 10:45 AM  [place] MAIN [facility] SERVICES [address] [place], [place], [address] Phone: [phone]   Fax:  [phone]  Name: Luke Durham MRN: [id number] Date of Birth: 03/07/1933

## 2017-01-18 ENCOUNTER — Encounter: Payer: Self-pay | Admitting: Physical Therapy

## 2017-01-18 ENCOUNTER — Ambulatory Visit: Payer: Medicare Other | Admitting: Occupational Therapy

## 2017-01-18 ENCOUNTER — Ambulatory Visit: Payer: Medicare Other | Admitting: Physical Therapy

## 2017-01-18 DIAGNOSIS — M6281 Muscle weakness (generalized): Secondary | ICD-10-CM | POA: Diagnosis not present

## 2017-01-18 DIAGNOSIS — R278 Other lack of coordination: Secondary | ICD-10-CM

## 2017-01-18 DIAGNOSIS — R293 Abnormal posture: Secondary | ICD-10-CM

## 2017-01-18 DIAGNOSIS — R2681 Unsteadiness on feet: Secondary | ICD-10-CM

## 2017-01-18 NOTE — Therapy (Signed)
[place] MAIN [facility] SERVICES [address] [place], [place], [address] Phone: [phone]   Fax:  [phone]  Physical Therapy Treatment  Patient Details  Name: Luke Durham MRN: [id number] Date of Birth: 03-23-1933 Referring Provider: Marlyn Corporal  Encounter Date: 01/18/2017      PT End of Session - 01/18/17 1155    Visit Number 19   Number of Visits 33   Date for PT Re-Evaluation 02/01/17   Authorization Type G code 9   Authorization Time Period 10   PT Start Time 1102   PT Stop Time 1145   PT Time Calculation (min) 43 min   Activity Tolerance Patient limited by fatigue;Patient limited by pain   Behavior During Therapy [facility] for tasks assessed/performed      Past Medical History:  Diagnosis Date  . Anxiety   . Arthritis   . Asthma    childhood asthma  . Cancer ([place]) 7 years ago   lymphoma, [place] (recent)  . Chronic kidney disease   . Colon polyps   . GERD (gastroesophageal reflux disease)   . History of kidney stones   . Parkinson disease [facility])     Past Surgical History:  Procedure Laterality Date  . COLON SURGERY    . CYSTOSCOPY WITH LITHOLAPAXY N/A 11/14/2016   Procedure: CYSTOSCOPY WITH LITHOLAPAXY;  Surgeon: Hollice Espy, MD;  Location: [facility] ORS;  Service: Urology;  Laterality: N/A;  . EYE SURGERY Bilateral    Cataract Extraction with IOL  . HERNIA REPAIR  20 years  . MOHS SURGERY    . SMALL INTESTINE SURGERY     Per patient 7 years  . TRANSURETHRAL RESECTION OF PROSTATE N/A 11/14/2016   Procedure: TRANSURETHRAL RESECTION OF THE PROSTATE (TURP);  Surgeon: Hollice Espy, MD;  Location: [facility] ORS;  Service: Urology;  Laterality: N/A;    There were no vitals filed for this visit.      Subjective Assessment - 01/18/17 1108    Subjective Patient reports doing okay; He reports trying some of the stretches at home but has a difficult time getting up/down from the floor;    Patient is accompained by: Family member   wife, [name]   Pertinent History Factors affecting rehab: high co-pay, progressive disease, supportive family   Limitations Sitting;Standing;Walking   How long can you sit comfortably? 1 hour   How long can you stand comfortably? 20 min.   How long can you walk comfortably? level surface with RW, greater distance   Patient Stated Goals improve balance and flexibility   Currently in Pain? No/denies   Pain Onset More than a month ago       TREATMENT Prior to exercise: Instructed patient in transitioning standing, to 1/2 knee to tall kneel to qped and then progressing to sidelying and then supine for better stretch and mobility; Patient required mod VCs for hand placement and LE positioning; Utilized chair to help with getting into floor;   Instructed patient in scooting to improve positioning on mat for better room with stretches; He is able to scoot hips but has difficulty scooting upper trunk due to UE weakness;   Instructed patient in rolling side to side to facilitate increased trunk rotation and better mobility x2 each direction Attempted rolling to side to come up on all fours, but patient unable with max cues; Patient required mod A for rolling from side to qped with max VCs and tactile cues for positioning; He reports significant fatigue with transitioning; Required min A  for transitioning qped to standing with cues for hand placement and foot positioning;    Following therapeutic activity: PT instructed patient in supine position x2 min to facilitate increased thoracic extension and cervical extension; Patient denies any pain when lying without pillow or support;  Patient in hooklying: Bridges  x20 with cues to increase ROM and to count for increased challenge with breath support and cognition; Knee to chest stretch 10 sec hold x2 bilaterally with min VCs to increase pull with UE for better stretch; Lumbar trunk rotation AROM x10 reps with cues to increase ROM for better  trunk stretch;  Patient heavily fatigued at end of treatment session due to difficulty getting up from floor;   Gait around gym x80 feet with RW, supervision with verbal and tactile cues to improve erect posture;                              PT Education - 01/18/17 1154    Education provided Yes   Education Details floor tranfsers, stretches;    Person(s) Educated Patient;Spouse   Methods Explanation;Demonstration;Tactile cues;Verbal cues   Comprehension Returned demonstration;Verbalized understanding;Verbal cues required;Tactile cues required;Need further instruction             PT Long Term Goals - 12/07/16 1029      PT LONG TERM GOAL #1   Title Patient will be independent in HEP in order to increase patient's ability to maintain gains achieved in therapy and assist with return to PLOF.    Time 8   Period Weeks   Status On-going     PT LONG TERM GOAL #2   Title Patient will decrease TUG test to <20 seconds in order to achieve an independently mobile status and decrease his risk for falls   Time 8   Period Weeks   Status Not Met     PT LONG TERM GOAL #3   Title Patient will increase overall strength to 4+/5 in both UE and LE in order to complete transfers safely with decreased risk for falls.    Time 8   Period Weeks   Status Partially Met     PT LONG TERM GOAL #4   Title Patient will increase his 10 m walk test time to >1.0 m/s in order to be a community ambulator at decreased risk for falls.    Time 8   Period Weeks   Status Partially Met               Plan - 01/18/17 1155    Clinical Impression Statement Patient expressed desire to perform HEP stretches in the floor. Instructed patient in floor transfers. He had significant difficulty with transitioning from supine to qped and reported increased discomfort in right shoulder; He reports increased fatigue after treatment session; Instructed patient in LE stretches and supine  exercise to improve strength; He required increased cues to improve ROM for better flexibility; He would benefit from additional skilled PT intervention to improve strength, gait safety and mobility;    Rehab Potential Fair   Clinical Impairments Affecting Rehab Potential Positive factors: motivated, family support, previous good responses to PT; Negative factors: high co-pay, progressive disease; Clinical Presentation: Evolving-multiple falls   PT Frequency 1x / week   PT Duration 8 weeks   PT Treatment/Interventions ADLs/Self Care Home Management;Aquatic Therapy;Electrical Stimulation;Moist Heat;DME Instruction;Gait training;Stair training;Functional mobility training;Therapeutic activities;Therapeutic exercise;Balance training;Neuromuscular re-education;Patient/family education;Manual techniques;Energy conservation   PT Next Visit Plan  work on Animal nutritionist;    [place] continue as given;    Consulted and Agree with Plan of Care Patient;Family member/caregiver      Patient will benefit from skilled therapeutic intervention in order to improve the following deficits and impairments:  Abnormal gait, Decreased balance, Decreased endurance, Decreased knowledge of use of DME, Decreased mobility, Decreased range of motion, Decreased safety awareness, Decreased strength, Difficulty walking, Hypomobility, Increased fascial restricitons, Impaired perceived functional ability, Impaired flexibility, Improper body mechanics, Postural dysfunction  Visit Diagnosis: Muscle weakness (generalized)  Other lack of coordination  Unsteadiness on feet  Abnormal posture     Problem List Patient Active Problem List   Diagnosis Date Noted  . BPH with urinary obstruction 11/14/2016  . Urinary frequency 10/10/2016  . Microscopic hematuria 10/10/2016  . Parkinson's syndrome ([place]) 06/14/2016  . Arthritis 06/14/2016  . Depression 06/14/2016  . Heartburn 06/14/2016  . Urinary incontinence  06/14/2016  . Skin lesion 06/14/2016  . Dementia 06/14/2016  . BPH (benign prostatic hyperplasia) 06/14/2016  . Bilateral edema of lower extremity 06/14/2016    Trotter,Margaret PT, DPT 01/18/2017, 12:00 PM  [place] MAIN [facility] SERVICES [address] [place], [place], [address] Phone: [phone]   Fax:  [phone]  Name: Luke Durham MRN: [id number] Date of Birth: 1933-05-22

## 2017-01-20 ENCOUNTER — Encounter: Payer: Self-pay | Admitting: Occupational Therapy

## 2017-01-20 NOTE — Therapy (Signed)
Luke Durham, Luke Durham, Luke Durham - 01/20/17 1407    Visit Number 10   Number of Visits 24   Date for OT Re-Evaluation 03/29/17   Authorization Type Medicare visit 10   OT Start Time 1015   OT Stop Time 1100   OT Time Calculation (min) 45 min   Activity Tolerance Patient tolerated treatment well   Behavior During Therapy [facility] for tasks assessed/performed      Past Medical History:  Diagnosis Date  . Anxiety   . Arthritis   . Asthma    childhood asthma  . Cancer ([place]) 7 years ago   lymphoma, [place] (recent)  . Chronic kidney disease   . Colon polyps   . GERD (gastroesophageal reflux disease)   . History of kidney stones   . Parkinson disease [facility])     Past Surgical History:  Procedure Laterality Date  . COLON SURGERY    . CYSTOSCOPY WITH LITHOLAPAXY N/A 11/14/2016   Procedure: CYSTOSCOPY WITH LITHOLAPAXY;  Surgeon: Hollice Espy, MD;  Location: [facility] ORS;  Service: Urology;  Laterality: N/A;  . EYE SURGERY Bilateral    Cataract Extraction with IOL  . HERNIA REPAIR  20 years  . MOHS SURGERY    . SMALL INTESTINE SURGERY     Per patient 7 years  . TRANSURETHRAL RESECTION OF PROSTATE N/A 11/14/2016   Procedure: TRANSURETHRAL RESECTION OF THE PROSTATE (TURP);  Surgeon: Hollice Espy, MD;  Location: [facility] ORS;  Service: Urology;  Laterality: N/A;    There were no vitals filed for this visit.      Subjective Assessment - 01/20/17 1405    Subjective  Patient reports he wants to do LSVT BIG program in the near future and discussed with his doctor, wanting to start mid May if possible and is willing to commit to 4 days a week for the intense program.   Pertinent History Patient reports he was diagnosed with Parkinson's disease about 10 plus years ago.  He reports a more recent decline in function in his daily activities in the last 6-12 months.  He has been seeing PT for the last couple months.   Patient Stated Goals Patient reports he wants to be able to do more for himself and around the house.                       OT Treatments/Exercises (OP) - 01/20/17 1429      ADLs   Writing Patient seen for handwriting with emphasis on size of letter formation for name and address to increase legiblity, patient requires cues for size of letters, use of hand flicks and built up pen handle to perform handwriting.      Fine Motor Coordination   Other Fine Motor Exercises Patient seen for manipulation of coins from tabletop with right hand to pick up and place into bank with cues for prehension patterns, using hand for storage.      Neurological Re-education Exercises   Other Exercises 1 Patient seen for resistive pinch skills with right hand from yellow to blue, unable to perform black resistance pins, placing items on elevated surface to promote increased shoulder ROM. Grip strengthening with 11#  for 25 reps for 1 set each hand.                 OT Education - 01/20/17 1407    Education provided Yes   Education Details fine motor coordination skills, handwriting and LSVT BIG program outline/principles   Person(s) Educated Patient;Spouse   Methods Explanation;Demonstration;Verbal cues   Comprehension Verbal cues required;Returned demonstration;Verbalized understanding             OT Long Term Goals - 01/20/17 1430      OT LONG TERM GOAL #1   Title Patient will demonstrate increase right hand grip to be able to cut food with modified independence.    Baseline unable at eval   Time 12   Period Weeks   Status On-going     OT LONG TERM GOAL #2   Title Patient will complete donning long sleeve shirt with modified  independence.   Baseline requires assist with long sleeves, can perform short sleeves.   Time 12   Period Weeks   Status On-going     OT LONG TERM GOAL #3   Title Patient will improve coordination to manage buttons on clothing with occasional assistance only.   Baseline requires assist with buttons each time   Time 12   Period Weeks   Status On-going     OT LONG TERM GOAL #4   Title Patient will complete managing pants after toileting with modified independence.    Baseline assist most days    Time 12   Period Weeks   Status On-going     OT LONG TERM GOAL #5   Title Patient will improve hand strength bilaterally to be able to don socks with modified independence.    Baseline unable at evaluation   Time 12   Period Weeks   Status On-going               Plan - 01/20/17 1408    Clinical Impression Statement Patient has continued to progress in all areas, he responds well to cues and would likely benefit from intensive LSVT BIG program and has now discussed this with his MD and wants to start in a few weeks.  He understands it will require 4 days a week of therapy, although he is not looking forward to coming that many times in a week, he reports he feels he would like to try it and see if it will help him become more consistent with his functional mobility and safety.  Will work towards getting patient scheduled for more intense program.    Rehab Potential Good   Clinical Impairments Affecting Rehab Potential positive: family support, negative: level of motivation, progressive disease   OT Frequency 1x / week   OT Duration 12 weeks   OT Treatment/Interventions Self-care/ADL training;Moist Heat;DME and/or AE instruction;Patient/family education;Therapeutic exercises;Balance training;Therapeutic exercise;Therapeutic activities;Neuromuscular education;Functional Mobility Training;Manual Therapy   Consulted and Agree with Plan of Care Patient;Family member/caregiver   Family  Member Consulted wife      Patient will benefit from skilled therapeutic intervention in order to improve the following deficits and impairments:  Abnormal gait, Decreased coordination, Decreased range of motion, Difficulty walking, Decreased endurance, Decreased safety awareness, Decreased activity tolerance, Decreased balance, Impaired UE functional use, Pain, Decreased mobility, Decreased strength  Visit Diagnosis: Muscle weakness (generalized)  Other lack of coordination    Problem List Patient Active Problem List   Diagnosis Date Noted  . BPH with urinary obstruction 11/14/2016  . Urinary frequency 10/10/2016  .  Microscopic hematuria 10/10/2016  . Parkinson's syndrome ([place]) 06/14/2016  . Arthritis 06/14/2016  . Depression 06/14/2016  . Heartburn 06/14/2016  . Urinary incontinence 06/14/2016  . Skin lesion 06/14/2016  . Dementia 06/14/2016  . BPH (benign prostatic hyperplasia) 06/14/2016  . Bilateral edema of lower extremity 06/14/2016   Achilles Dunk, OTR/L, CLT  Lovett,Amy 01/20/2017, 2:39 PM  [place] MAIN [facility] SERVICES [address] [place], [place], [address] Phone: [phone]   Fax:  [phone]  Name: Yamir Carignan MRN: [id number] Date of Birth: December 24, 1932

## 2017-01-25 ENCOUNTER — Encounter: Payer: Self-pay | Admitting: Physical Therapy

## 2017-01-25 ENCOUNTER — Ambulatory Visit: Payer: Medicare Other | Attending: Physician Assistant | Admitting: Occupational Therapy

## 2017-01-25 ENCOUNTER — Ambulatory Visit: Payer: Medicare Other | Admitting: Physical Therapy

## 2017-01-25 ENCOUNTER — Encounter: Payer: Self-pay | Admitting: Occupational Therapy

## 2017-01-25 DIAGNOSIS — R2681 Unsteadiness on feet: Secondary | ICD-10-CM

## 2017-01-25 DIAGNOSIS — R262 Difficulty in walking, not elsewhere classified: Secondary | ICD-10-CM | POA: Diagnosis present

## 2017-01-25 DIAGNOSIS — R278 Other lack of coordination: Secondary | ICD-10-CM | POA: Diagnosis present

## 2017-01-25 DIAGNOSIS — R293 Abnormal posture: Secondary | ICD-10-CM | POA: Diagnosis present

## 2017-01-25 DIAGNOSIS — M6281 Muscle weakness (generalized): Secondary | ICD-10-CM

## 2017-01-25 NOTE — Therapy (Signed)
[place] MAIN [facility] SERVICES [address] [place], [place], [address] Phone: [phone]   Fax:  [phone]  Physical Therapy Treatment/progress Note  Patient Details  Name: Luke Durham MRN: [id number] Date of Birth: May 06, 1933 Referring Provider: Marlyn Corporal  Encounter Date: 01/25/2017      PT End of Session - 01/25/17 1112    Visit Number 20   Number of Visits 41   Date for PT Re-Evaluation 03/22/17   Authorization Type G code 10   Authorization Time Period 10   PT Start Time 1102   PT Stop Time 1145   PT Time Calculation (min) 43 min   Activity Tolerance Patient limited by fatigue;Patient limited by pain   Behavior During Therapy [facility] for tasks assessed/performed      Past Medical History:  Diagnosis Date  . Anxiety   . Arthritis   . Asthma    childhood asthma  . Cancer ([place]) 7 years ago   lymphoma, [place] (recent)  . Chronic kidney disease   . Colon polyps   . GERD (gastroesophageal reflux disease)   . History of kidney stones   . Parkinson disease [facility])     Past Surgical History:  Procedure Laterality Date  . COLON SURGERY    . CYSTOSCOPY WITH LITHOLAPAXY N/A 11/14/2016   Procedure: CYSTOSCOPY WITH LITHOLAPAXY;  Surgeon: Hollice Espy, MD;  Location: [facility] ORS;  Service: Urology;  Laterality: N/A;  . EYE SURGERY Bilateral    Cataract Extraction with IOL  . HERNIA REPAIR  20 years  . MOHS SURGERY    . SMALL INTESTINE SURGERY     Per patient 7 years  . TRANSURETHRAL RESECTION OF PROSTATE N/A 11/14/2016   Procedure: TRANSURETHRAL RESECTION OF THE PROSTATE (TURP);  Surgeon: Hollice Espy, MD;  Location: [facility] ORS;  Service: Urology;  Laterality: N/A;    There were no vitals filed for this visit.      Subjective Assessment - 01/25/17 1111    Subjective Patient reports that he fell last night. He was reaching into the cabinet and his feet got tangled up and he fell backwards; Denies any pain; Reports that he was able to  get up from the floor well;    Patient is accompained by: Family member  wife, Luke Durham   Pertinent History Factors affecting rehab: high co-pay, progressive disease, supportive family   Limitations Sitting;Standing;Walking   How long can you sit comfortably? 1 hour   How long can you stand comfortably? 20 min.   How long can you walk comfortably? level surface with RW, greater distance   Patient Stated Goals improve balance and flexibility   Currently in Pain? No/denies  no pain current, but right shoulder hurts occasionally   Pain Onset More than a month ago        TREATMENT Patient required supervision with verbal cues for sitting to supine:  PT instructed patient in supine position x2 min to facilitate increased thoracic extension and cervical extension; Patient denies any pain when lying without pillow or support;  PT educated patient in stretches, he required cues for positioning and hand placement for better stretch; Passive knee to chest stretch 15 sec hold x2 bilaterally; Lumbar trunk rotation AROM x10 reps with cues to increase ROM for better trunk stretch;  Patient in hooklying: Single leg bridge x10 bilaterally with min Vcs for positioning to improve exercise technique;  Supine legs out straight: PWR up, pushing through UE/LE to lift hips x10 reps, patient unable to lift  hips off table despite max VCs and demonstration; He does exhibits gluteal contraction;  BLE knees to chest, suspended heel slides for core abdominal/hip strengthening 2x5 with min Vcs for correct exercise technique;   Patient able to transition supine to sitting supervision with cues for rolling to side for quicker movement and better positioning following stretches;        [facility] PT Assessment - 01/25/17 0001      Standardized Balance Assessment   10 Meter Walk 0.833 m/s with RW (home ambulator, limited community ambulator, more impaired from 12/07/16 which was 0.95 m/s)     Timed Up and Go Test    Normal TUG (seconds) 39   TUG Comments with RW, min A and cues for erect posture and to stand tall for better foot clearance; improved form 12/07/16 which was 0.95 m/s      Instructed patient in 10 meter walk and timed up and go; see above; He requires cues to improve erect posture and increase step length for better gait ability;  Patient instructed in timed up and go several times with slower time each rep: 1st attempt: 39 sec, 2nd attempt: 59 sec, 3rd attempt: 1 min 30 sec, 4th attempt: 70 sec; He requires cues for erect posture and instructed patient to continue turning in same direction for better gait ability; Patient educated on importance of keeping erect posture for better gait ability;                        PT Education - 01/25/17 1112    Education provided Yes   Education Details supine stretches, strengthening;    Person(s) Educated Patient;Spouse   Methods Explanation;Demonstration;Verbal cues   Comprehension Verbalized understanding;Returned demonstration;Verbal cues required             PT Long Term Goals - 01/25/17 1131      PT LONG TERM GOAL #1   Title Patient will be independent in HEP in order to increase patient's ability to maintain gains achieved in therapy and assist with return to PLOF.    Time 8   Period Weeks   Status On-going     PT LONG TERM GOAL #2   Title Patient will decrease TUG test to <20 seconds in order to achieve an independently mobile status and decrease his risk for falls   Time 8   Period Weeks   Status Partially Met     PT LONG TERM GOAL #3   Title Patient will increase overall strength to 4+/5 in both UE and LE in order to complete transfers safely with decreased risk for falls.    Time 8   Period Weeks   Status Partially Met     PT LONG TERM GOAL #4   Title Patient will increase his 10 m walk test time to >1.0 m/s in order to be a community ambulator at decreased risk for falls.    Time 8   Period Weeks    Status Partially Met               Plan - 01/25/17 1147    Clinical Impression Statement Patient instructed in advanced stretches and strengthening exercise. He requires cues for positioning on mat table to improve exercise technique; He had increased difficulty following instruction for supine hip extension having difficulty lifting hips off table. Patient does demonstrate improved timed up and go compared to previous sessions; He still has difficulty with turns with frequent freezing and short  shuffled steps; He requires frequent cues to increase erect posture for better foot clearance; He would benefit from additional skilled PT intervention to improve strength, balance and gait safety;    Rehab Potential Fair   Clinical Impairments Affecting Rehab Potential Positive factors: motivated, family support, previous good responses to PT; Negative factors: high co-pay, progressive disease; Clinical Presentation: Evolving-multiple falls   PT Frequency 1x / week   PT Duration 8 weeks   PT Treatment/Interventions ADLs/Self Care Home Management;Aquatic Therapy;Electrical Stimulation;Moist Heat;DME Instruction;Gait training;Stair training;Functional mobility training;Therapeutic activities;Therapeutic exercise;Balance training;Neuromuscular re-education;Patient/family education;Manual techniques;Energy conservation   PT Next Visit Plan work on Animal nutritionist;    [place] continue as given;    Consulted and Agree with Plan of Care Patient;Family member/caregiver      Patient will benefit from skilled therapeutic intervention in order to improve the following deficits and impairments:  Abnormal gait, Decreased balance, Decreased endurance, Decreased knowledge of use of DME, Decreased mobility, Decreased range of motion, Decreased safety awareness, Decreased strength, Difficulty walking, Hypomobility, Increased fascial restricitons, Impaired perceived functional ability, Impaired  flexibility, Improper body mechanics, Postural dysfunction  Visit Diagnosis: Muscle weakness (generalized)  Other lack of coordination  Unsteadiness on feet  Abnormal posture       G-Codes - Feb 02, 2017 1149    Functional Assessment Tool Used (Outpatient Only) timed up and go, 10 meter walk, transfers, clinical judgement   Functional Limitation Mobility: Walking and moving around   Mobility: Walking and Moving Around Current Status [phone]) At least 40 percent but less than 60 percent impaired, limited or restricted   Mobility: Walking and Moving Around Goal Status [phone]) At least 20 percent but less than 40 percent impaired, limited or restricted      Problem List Patient Active Problem List   Diagnosis Date Noted  . BPH with urinary obstruction 11/14/2016  . Urinary frequency 10/10/2016  . Microscopic hematuria 10/10/2016  . Parkinson's syndrome ([place]) 06/14/2016  . Arthritis 06/14/2016  . Depression 06/14/2016  . Heartburn 06/14/2016  . Urinary incontinence 06/14/2016  . Skin lesion 06/14/2016  . Dementia 06/14/2016  . BPH (benign prostatic hyperplasia) 06/14/2016  . Bilateral edema of lower extremity 06/14/2016    Trotter,Margaret PT, DPT 02-02-17, 11:50 AM  [place] MAIN [facility] SERVICES [address] [place], [place], [address] Phone: [phone]   Fax:  [phone]  Name: Luke Durham MRN: [id number] Date of Birth: 1933/08/12

## 2017-01-29 ENCOUNTER — Encounter: Payer: Self-pay | Admitting: Occupational Therapy

## 2017-01-29 NOTE — Therapy (Signed)
[place] MAIN [facility] SERVICES [address] [place], [place], [address] Phone: [phone]   Fax:  [phone]  Occupational Therapy Treatment  Patient Details  Name: Luke Durham MRN: [id number] Date of Birth: [birth year] Referring Provider: Marlyn Corporal  Encounter Date: 01/25/2017      OT End of Session - 01/29/17 1353    Visit Number 11   Number of Visits 24   Date for OT Re-Evaluation 03/29/17   Authorization Type Medicare visit 11   OT Start Time 1015   OT Stop Time 1059   OT Time Calculation (min) 44 min   Activity Tolerance Patient tolerated treatment well   Behavior During Therapy [facility] for tasks assessed/performed      Past Medical History:  Diagnosis Date  . Anxiety   . Arthritis   . Asthma    childhood asthma  . Cancer ([place]) 7 years ago   lymphoma, [place] (recent)  . Chronic kidney disease   . Colon polyps   . GERD (gastroesophageal reflux disease)   . History of kidney stones   . Parkinson disease [facility])     Past Surgical History:  Procedure Laterality Date  . COLON SURGERY    . CYSTOSCOPY WITH LITHOLAPAXY N/A 11/14/2016   Procedure: CYSTOSCOPY WITH LITHOLAPAXY;  Surgeon: Hollice Espy, MD;  Location: [facility] ORS;  Service: Urology;  Laterality: N/A;  . EYE SURGERY Bilateral    Cataract Extraction with IOL  . HERNIA REPAIR  20 years  . MOHS SURGERY    . SMALL INTESTINE SURGERY     Per patient 7 years  . TRANSURETHRAL RESECTION OF PROSTATE N/A 11/14/2016   Procedure: TRANSURETHRAL RESECTION OF THE PROSTATE (TURP);  Surgeon: Hollice Espy, MD;  Location: [facility] ORS;  Service: Urology;  Laterality: N/A;    There were no vitals filed for this visit.      Subjective Assessment - 01/29/17 1349    Subjective  Patient reports he fell yesterday, he was reaching into the cabinet and felt himself fall backwards, he denies any injury.  He reports he was able to get up with the use of a chair and his wife standing there.     Patient is accompained by: Family member   Pertinent History Patient reports he was diagnosed with Parkinson's disease about 10 plus years ago.  He reports a more recent decline in function in his daily activities in the last 6-12 months.  He has been seeing PT for the last couple months.   Patient Stated Goals Patient reports he wants to be able to do more for himself and around the house.    Currently in Pain? No/denies   Pain Score 0-No pain                      OT Treatments/Exercises (OP) - 01/29/17 1520      Fine Motor Coordination   Other Fine Motor Exercises Patient seen for gross motor movements in sitting with reaching in a variety of planes with balloon toss, also encouraging sitting balance, reaction times and reach with bilateral UEs.  Patient also seen for fine motor coordination tasks of manipulation of nuts and bolts with placing and removing with thumb finger combinations.  Cues for prehension patterns and isolated movements.                 OT Education - 01/29/17 1352    Education provided Yes   Education Details fine motor coordination tasks, gross motor  activity with balloon   Person(s) Educated Patient   Methods Explanation;Demonstration;Verbal cues   Comprehension Verbal cues required;Returned demonstration;Verbalized understanding             OT Long Term Goals - 01/20/17 1430      OT LONG TERM GOAL #1   Title Patient will demonstrate increase right hand grip to be able to cut food with modified independence.    Baseline unable at eval   Time 12   Period Weeks   Status On-going     OT LONG TERM GOAL #2   Title Patient will complete donning long sleeve shirt with modified independence.   Baseline requires assist with long sleeves, can perform short sleeves.   Time 12   Period Weeks   Status On-going     OT LONG TERM GOAL #3   Title Patient will improve coordination to manage buttons on clothing with occasional assistance only.    Baseline requires assist with buttons each time   Time 12   Period Weeks   Status On-going     OT LONG TERM GOAL #4   Title Patient will complete managing pants after toileting with modified independence.    Baseline assist most days    Time 12   Period Weeks   Status On-going     OT LONG TERM GOAL #5   Title Patient will improve hand strength bilaterally to be able to don socks with modified independence.    Baseline unable at evaluation   Time 12   Period Weeks   Status On-going               Plan - 01/29/17 1353    Clinical Impression Statement Patient with fall last date, no injury or pain reported, lost his balance backwards in the kitchen.  Patient seen for focus on gross motor coordination tasks in sitting this date with engagement of sitting balance into task while reaching with right and left hands for balloon toss, working towards reaching and reactionary movements. Patient continues to work towards fine motor coordination tasks to improve performance in grooming, feeding and dressing skills.    Rehab Potential Good   Clinical Impairments Affecting Rehab Potential positive: family support, negative: level of motivation, progressive disease   OT Frequency 1x / week   OT Duration 12 weeks   OT Treatment/Interventions Self-care/ADL training;Moist Heat;DME and/or AE instruction;Patient/family education;Therapeutic exercises;Balance training;Therapeutic exercise;Therapeutic activities;Neuromuscular education;Functional Mobility Training;Manual Therapy   Consulted and Agree with Plan of Care Patient;Family member/caregiver   Family Member Consulted wife      Patient will benefit from skilled therapeutic intervention in order to improve the following deficits and impairments:  Abnormal gait, Decreased coordination, Decreased range of motion, Difficulty walking, Decreased endurance, Decreased safety awareness, Decreased activity tolerance, Decreased balance, Impaired UE  functional use, Pain, Decreased mobility, Decreased strength  Visit Diagnosis: Muscle weakness (generalized)  Other lack of coordination    Problem List Patient Active Problem List   Diagnosis Date Noted  . BPH with urinary obstruction 11/14/2016  . Urinary frequency 10/10/2016  . Microscopic hematuria 10/10/2016  . Parkinson's syndrome ([place]) 06/14/2016  . Arthritis 06/14/2016  . Depression 06/14/2016  . Heartburn 06/14/2016  . Urinary incontinence 06/14/2016  . Skin lesion 06/14/2016  . Dementia 06/14/2016  . BPH (benign prostatic hyperplasia) 06/14/2016  . Bilateral edema of lower extremity 06/14/2016   Frenchie Pribyl T Nevyn Bossman, OTR/L, CLT  Sumi Lye 01/29/2017, 3:22 PM  [place] MAIN REHAB SERVICES [address]  [place], [place], [address] Phone: [phone]   Fax:  [phone]  Name: Luke Durham MRN: [id number] Date of Birth: [birth year]

## 2017-01-31 ENCOUNTER — Ambulatory Visit: Payer: Medicare Other

## 2017-01-31 ENCOUNTER — Ambulatory Visit: Payer: Medicare Other | Admitting: Physician Assistant

## 2017-02-02 ENCOUNTER — Encounter: Payer: Medicare Other | Admitting: Occupational Therapy

## 2017-02-02 ENCOUNTER — Ambulatory Visit: Payer: Medicare Other | Admitting: Physical Therapy

## 2017-02-08 ENCOUNTER — Ambulatory Visit: Payer: Medicare Other | Admitting: Physical Therapy

## 2017-02-08 ENCOUNTER — Encounter: Payer: Self-pay | Admitting: Physical Therapy

## 2017-02-08 ENCOUNTER — Ambulatory Visit: Payer: Medicare Other | Admitting: Occupational Therapy

## 2017-02-08 DIAGNOSIS — R278 Other lack of coordination: Secondary | ICD-10-CM

## 2017-02-08 DIAGNOSIS — R2681 Unsteadiness on feet: Secondary | ICD-10-CM

## 2017-02-08 DIAGNOSIS — R293 Abnormal posture: Secondary | ICD-10-CM

## 2017-02-08 DIAGNOSIS — R262 Difficulty in walking, not elsewhere classified: Secondary | ICD-10-CM

## 2017-02-08 DIAGNOSIS — M6281 Muscle weakness (generalized): Secondary | ICD-10-CM

## 2017-02-08 NOTE — Patient Instructions (Addendum)
Knee Extension: Resisted (Sitting)   With band looped around right ankle and under other foot, straighten leg with ankle loop. Keep other leg bent to increase resistance. Repeat _10___ times per set. Do __2__ sets per session. Do _2___ sessions per day.  http://[email or web address].us/691   Copyright  VHI. All rights reserved.  ABDUCTION: Sitting - Exercise Ball: Resistance Band (Active)   Sit with feet flat. With band tied around both legs, Lift right leg slightly and, against resistance band, draw it out to side. Complete __2_ sets of __10_ repetitions. Perform _2__ sessions per day.  Copyright  VHI. All rights reserved.  FLEXION: Sitting - Resistance Band (Active)   Sit, both feet flat. Have band tied around both legs above knees, lift right knee toward ceiling.Repeat with other knee Complete _2__ sets of _10__ repetitions. Perform _2__ sessions per day.  [email or web address].[email or web address]   Knee to Chest (Flexion)    With band around both legs, above knees, lift one leg up as far as possible, then relax,  Repeat with other knee. Repeat 10____ times. Do _2___ sessions per day.  http://[email or web address].[email or web address]   Copyright  VHI. All rights reserved.  Hip Abduction: Side-Lying (Single Leg)    Lie on side with knees bent, tubing around thighs just above knees. Raise top leg, keeping knee bent. Repeat 10__ times per set. Repeat on other side. Do _2_ sets per session. Do _2_ sessions per week.  http://tub.exer.[email or web address]   Copyright  VHI. All rights reserved.

## 2017-02-08 NOTE — Therapy (Signed)
[place] MAIN [facility] SERVICES [address] [place], [place], [address] Phone: [phone]   Fax:  [phone]  Physical Therapy Treatment/Discharge Summary  Patient Details  Name: Luke Durham MRN: [id number] Date of Birth: 10/22/1933 Referring Provider: Marlyn Corporal  Encounter Date: 02/08/2017      PT End of Session - 02/08/17 1051    Visit Number 21   Number of Visits 41   Date for PT Re-Evaluation 03/22/17   Authorization Type G code 1   Authorization Time Period 10   PT Start Time 1032   PT Stop Time 1115   PT Time Calculation (min) 43 min   Activity Tolerance Patient limited by fatigue;Patient tolerated treatment well   Behavior During Therapy [facility] for tasks assessed/performed      Past Medical History:  Diagnosis Date  . Anxiety   . Arthritis   . Asthma    childhood asthma  . Cancer ([place]) 7 years ago   lymphoma, [place] (recent)  . Chronic kidney disease   . Colon polyps   . GERD (gastroesophageal reflux disease)   . History of kidney stones   . Parkinson disease [facility])     Past Surgical History:  Procedure Laterality Date  . COLON SURGERY    . CYSTOSCOPY WITH LITHOLAPAXY N/A 11/14/2016   Procedure: CYSTOSCOPY WITH LITHOLAPAXY;  Surgeon: Hollice Espy, MD;  Location: [facility] ORS;  Service: Urology;  Laterality: N/A;  . EYE SURGERY Bilateral    Cataract Extraction with IOL  . HERNIA REPAIR  20 years  . MOHS SURGERY    . SMALL INTESTINE SURGERY     Per patient 7 years  . TRANSURETHRAL RESECTION OF PROSTATE N/A 11/14/2016   Procedure: TRANSURETHRAL RESECTION OF THE PROSTATE (TURP);  Surgeon: Hollice Espy, MD;  Location: [facility] ORS;  Service: Urology;  Laterality: N/A;    There were no vitals filed for this visit.      Subjective Assessment - 02/08/17 1049    Subjective Patient reports still having intermittent right shoulder pain; He denies any pain currently; He reports no new falls; He reports wanting to know what to do  when experiencing freezing episodes;    Patient is accompained by: Family member  wife, [name]   Pertinent History Factors affecting rehab: high co-pay, progressive disease, supportive family   Limitations Sitting;Standing;Walking   How long can you sit comfortably? 1 hour   How long can you stand comfortably? 20 min.   How long can you walk comfortably? level surface with RW, greater distance   Patient Stated Goals improve balance and flexibility   Currently in Pain? No/denies   Pain Onset More than a month ago          TREATMENT  Patient educated in strategies to address freezing. Ex- instructed to increase weight shift side/side with cues for full weight shift not just hip shift to reduce freezing. Also instructed patient when experiencing freezing to try and stand tall as when he is in a crouched position it is harder to pick foot up.  Patient ambulated x125 feet with 2 episodes of freezing, patient able to demonstrate independence in strategies for freezing.   Patient required supervision with verbal cues for sitting to supine:  PT instructed patient in supine position x2 min to facilitate increased thoracic extension and cervical extension; Patient denies any pain when lying without pillow or support;  PT educated patient in stretches, he required cues for positioning and hand placement for better stretch; Lumbar trunk  rotation AROM x10 reps with cues to increase ROM for better trunk stretch;  Patient in hooklying: Single leg bridge x10 each LE with min Vcs for positioning to improve exercise technique; BLE bridges x15 with arms across chest with cues to increase ROM for better hip extension strengthening; Hip flexion march green tband x15 bilaterally with mod tactile cues to increase ROM for better strengthening;  Rolling side/side x5 each direction with cues to reach with UE for better rolling. Patient independent in transitioning sidelying to sitting;  Provided written  handout for advanced HEP;                         PT Education - 02/08/17 1050    Education provided Yes   Education Details exercise, stretches, recommendations for strategies to reduce freezing;    Person(s) Educated Patient   Methods Explanation;Demonstration;Verbal cues   Comprehension Verbalized understanding;Returned demonstration;Verbal cues required             PT Long Term Goals - 02/08/17 1244      PT LONG TERM GOAL #1   Title Patient will be independent in HEP in order to increase patient's ability to maintain gains achieved in therapy and assist with return to PLOF.    Time 8   Period Weeks   Status On-going     PT LONG TERM GOAL #2   Title Patient will decrease TUG test to <20 seconds in order to achieve an independently mobile status and decrease his risk for falls   Time 8   Period Weeks   Status Partially Met     PT LONG TERM GOAL #3   Title Patient will increase overall strength to 4+/5 in both UE and LE in order to complete transfers safely with decreased risk for falls.    Time 8   Period Weeks   Status Partially Met     PT LONG TERM GOAL #4   Title Patient will increase his 10 m walk test time to >1.0 m/s in order to be a community ambulator at decreased risk for falls.    Time 8   Period Weeks   Status Partially Met               Plan - 02/08/17 1236    Clinical Impression Statement Patient reports still having episodes of freezing. Educated patient in strategies to do to reduce freezing and improve mobility when freezing occurs. Patient also educated in LE stretches and strengthening exercise. He requires cues for correct exercise technique including to increase ROM for better strengthening. Patient provided with green tband for increased resistance. Patient expressed desire to start LSVT program for Parkinson's Disease. Will discharge him from PT at this time while he pursues LSVT. Patient agreeable.    Rehab Potential  Fair   Clinical Impairments Affecting Rehab Potential Positive factors: motivated, family support, previous good responses to PT; Negative factors: high co-pay, progressive disease; Clinical Presentation: Evolving-multiple falls   PT Frequency 1x / week   PT Duration 8 weeks   PT Treatment/Interventions ADLs/Self Care Home Management;Aquatic Therapy;Electrical Stimulation;Moist Heat;DME Instruction;Gait training;Stair training;Functional mobility training;Therapeutic activities;Therapeutic exercise;Balance training;Neuromuscular re-education;Patient/family education;Manual techniques;Energy conservation   PT Next Visit Plan work on Animal nutritionist;    [place] continue as given;    Consulted and Agree with Plan of Care Patient;Family member/caregiver      Patient will benefit from skilled therapeutic intervention in order to improve the following deficits and  impairments:  Abnormal gait, Decreased balance, Decreased endurance, Decreased knowledge of use of DME, Decreased mobility, Decreased range of motion, Decreased safety awareness, Decreased strength, Difficulty walking, Hypomobility, Increased fascial restricitons, Impaired perceived functional ability, Impaired flexibility, Improper body mechanics, Postural dysfunction  Visit Diagnosis: Muscle weakness (generalized)  Other lack of coordination  Unsteadiness on feet  Abnormal posture     Problem List Patient Active Problem List   Diagnosis Date Noted  . BPH with urinary obstruction 11/14/2016  . Urinary frequency 10/10/2016  . Microscopic hematuria 10/10/2016  . Parkinson's syndrome ([place]) 06/14/2016  . Arthritis 06/14/2016  . Depression 06/14/2016  . Heartburn 06/14/2016  . Urinary incontinence 06/14/2016  . Skin lesion 06/14/2016  . Dementia 06/14/2016  . BPH (benign prostatic hyperplasia) 06/14/2016  . Bilateral edema of lower extremity 06/14/2016    Trotter,Margaret PT, DPT 02/08/2017, 12:46  PM  [place] MAIN [facility] SERVICES [address] [place], [place], [address] Phone: [phone]   Fax:  [phone]  Name: Luke Durham MRN: [id number] Date of Birth: [birth year]

## 2017-02-10 ENCOUNTER — Ambulatory Visit (INDEPENDENT_AMBULATORY_CARE_PROVIDER_SITE_OTHER): Payer: Medicare Other | Admitting: Physician Assistant

## 2017-02-10 ENCOUNTER — Encounter: Payer: Self-pay | Admitting: Occupational Therapy

## 2017-02-10 ENCOUNTER — Ambulatory Visit: Payer: Medicare Other

## 2017-02-10 VITALS — BP 108/62 | HR 80 | Temp 97.6°F | Ht 66.0 in | Wt 173.0 lb

## 2017-02-10 DIAGNOSIS — N401 Enlarged prostate with lower urinary tract symptoms: Secondary | ICD-10-CM | POA: Diagnosis not present

## 2017-02-10 DIAGNOSIS — Z136 Encounter for screening for cardiovascular disorders: Secondary | ICD-10-CM | POA: Diagnosis not present

## 2017-02-10 DIAGNOSIS — R600 Localized edema: Secondary | ICD-10-CM | POA: Diagnosis not present

## 2017-02-10 DIAGNOSIS — Z0000 Encounter for general adult medical examination without abnormal findings: Secondary | ICD-10-CM | POA: Diagnosis not present

## 2017-02-10 DIAGNOSIS — Z13220 Encounter for screening for lipoid disorders: Secondary | ICD-10-CM

## 2017-02-10 DIAGNOSIS — G20 Parkinson's disease: Secondary | ICD-10-CM

## 2017-02-10 DIAGNOSIS — N138 Other obstructive and reflux uropathy: Secondary | ICD-10-CM

## 2017-02-10 NOTE — Patient Instructions (Signed)
 Health Maintenance, Male A healthy lifestyle and preventive care is important for your health and wellness. Ask your health care provider about what schedule of regular examinations is right for you. What should I know about weight and diet?  Eat a Healthy Diet  Eat plenty of vegetables, fruits, whole grains, low-fat dairy products, and lean protein.  Do not eat a lot of foods high in solid fats, added sugars, or salt. Maintain a Healthy Weight  Regular exercise can help you achieve or maintain a healthy weight. You should:  Do at least 150 minutes of exercise each week. The exercise should increase your heart rate and make you sweat (moderate-intensity exercise).  Do strength-training exercises at least twice a week. Watch Your Levels of Cholesterol and Blood Lipids  Have your blood tested for lipids and cholesterol every 5 years starting at 81 years of age. If you are at high risk for heart disease, you should start having your blood tested when you are 81 years old. You may need to have your cholesterol levels checked more often if:  Your lipid or cholesterol levels are high.  You are older than 81 years of age.  You are at high risk for heart disease. What should I know about cancer screening? Many types of cancers can be detected early and may often be prevented. Lung Cancer  You should be screened every year for lung cancer if:  You are a current smoker who has smoked for at least 30 years.  You are a former smoker who has quit within the past 15 years.  Talk to your health care provider about your screening options, when you should start screening, and how often you should be screened. Colorectal Cancer  Routine colorectal cancer screening usually begins at 81 years of age and should be repeated every 5-10 years until you are 81 years old. You may need to be screened more often if early forms of precancerous polyps or small growths are found. Your health care provider  may recommend screening at an earlier age if you have risk factors for colon cancer.  Your health care provider may recommend using home test kits to check for hidden blood in the stool.  A small camera at the end of a tube can be used to examine your colon (sigmoidoscopy or colonoscopy). This checks for the earliest forms of colorectal cancer. Prostate and Testicular Cancer  Depending on your age and overall health, your health care provider may do certain tests to screen for prostate and testicular cancer.  Talk to your health care provider about any symptoms or concerns you have about testicular or prostate cancer. Skin Cancer  Check your skin from head to toe regularly.  Tell your health care provider about any new moles or changes in moles, especially if:  There is a change in a mole's size, shape, or color.  You have a mole that is larger than a pencil eraser.  Always use sunscreen. Apply sunscreen liberally and repeat throughout the day.  Protect yourself by wearing long sleeves, pants, a wide-brimmed hat, and sunglasses when outside. What should I know about heart disease, diabetes, and high blood pressure?  If you are 18-39 years of age, have your blood pressure checked every 3-5 years. If you are 40 years of age or older, have your blood pressure checked every year. You should have your blood pressure measured twice-once when you are at a hospital or clinic, and once when you are not at   a hospital or clinic. Record the average of the two measurements. To check your blood pressure when you are not at a hospital or clinic, you can use:  An automated blood pressure machine at a pharmacy.  A home blood pressure monitor.  Talk to your health care provider about your target blood pressure.  If you are between 45-79 years old, ask your health care provider if you should take aspirin to prevent heart disease.  Have regular diabetes screenings by checking your fasting blood sugar  level.  If you are at a normal weight and have a low risk for diabetes, have this test once every three years after the age of 45.  If you are overweight and have a high risk for diabetes, consider being tested at a younger age or more often.  A one-time screening for abdominal aortic aneurysm (AAA) by ultrasound is recommended for men aged 65-75 years who are current or former smokers. What should I know about preventing infection? Hepatitis B  If you have a higher risk for hepatitis B, you should be screened for this virus. Talk with your health care provider to find out if you are at risk for hepatitis B infection. Hepatitis C  Blood testing is recommended for:  Everyone born from 1945 through 1965.  Anyone with known risk factors for hepatitis C. Sexually Transmitted Diseases (STDs)  You should be screened each year for STDs including gonorrhea and chlamydia if:  You are sexually active and are younger than 81 years of age.  You are older than 81 years of age and your health care provider tells you that you are at risk for this type of infection.  Your sexual activity has changed since you were last screened and you are at an increased risk for chlamydia or gonorrhea. Ask your health care provider if you are at risk.  Talk with your health care provider about whether you are at high risk of being infected with HIV. Your health care provider may recommend a prescription medicine to help prevent HIV infection. What else can I do?  Schedule regular health, dental, and eye exams.  Stay current with your vaccines (immunizations).  Do not use any tobacco products, such as cigarettes, chewing tobacco, and e-cigarettes. If you need help quitting, ask your health care provider.  Limit alcohol intake to no more than 2 drinks per day. One drink equals 12 ounces of beer, 5 ounces of wine, or 1 ounces of hard liquor.  Do not use street drugs.  Do not share needles.  Ask your health  care provider for help if you need support or information about quitting drugs.  Tell your health care provider if you often feel depressed.  Tell your health care provider if you have ever been abused or do not feel safe at home. This information is not intended to replace advice given to you by your health care provider. Make sure you discuss any questions you have with your health care provider. Document Released: 04/07/2008 Document Revised: 06/08/2016 Document Reviewed: 07/14/2015 Elsevier Interactive Patient Education  2017 [employer] Inc[employer]  

## 2017-02-10 NOTE — Patient Instructions (Signed)
Mr. Luke Durham , Thank you for taking time to come for your Medicare Wellness Visit. I appreciate your ongoing commitment to your health goals. Please review the following plan we discussed and let me know if I can assist you in the future.   Screening recommendations/referrals: Colonoscopy: N/A Recommended yearly ophthalmology/optometry visit for glaucoma screening and checkup Recommended yearly dental visit for hygiene and checkup  Vaccinations: Influenza vaccine: done 07/24/16 Pneumococcal vaccine: Received Prevnar13 12/16/16, Pneumovax will be due 12/16/17 Tdap vaccine: declined  Advanced directives: requested copy once completed  Conditions/risks identified: fall risk prevention  Next appointment: None   Preventive Care 81 Years and Older, Male Preventive care refers to lifestyle choices and visits with your health care provider that can promote health and wellness. What does preventive care include?  A yearly physical exam. This is also called an annual well check.  Dental exams once or twice a year.  Routine eye exams. Ask your health care provider how often you should have your eyes checked.  Personal lifestyle choices, including:  Daily care of your teeth and gums.  Regular physical activity.  Eating a healthy diet.  Avoiding tobacco and drug use.  Limiting alcohol use.  Practicing safe sex.  Taking low doses of aspirin every day.  Taking vitamin and mineral supplements as recommended by your health care provider. What happens during an annual well check? The services and screenings done by your health care provider during your annual well check will depend on your age, overall health, lifestyle risk factors, and family history of disease. Counseling  Your health care provider may ask you questions about your:  Alcohol use.  Tobacco use.  Drug use.  Emotional well-being.  Home and relationship well-being.  Sexual activity.  Eating habits.  History  of falls.  Memory and ability to understand (cognition).  Work and work Statistician. Screening  You may have the following tests or measurements:  Height, weight, and BMI.  Blood pressure.  Lipid and cholesterol levels. These may be checked every 5 years, or more frequently if you are over 52 years old.  Skin check.  Lung cancer screening. You may have this screening every year starting at age 22 if you have a 30-pack-year history of smoking and currently smoke or have quit within the past 15 years.  Fecal occult blood test (FOBT) of the stool. You may have this test every year starting at age 50.  Flexible sigmoidoscopy or colonoscopy. You may have a sigmoidoscopy every 5 years or a colonoscopy every 10 years starting at age 74.  Prostate cancer screening. Recommendations will vary depending on your family history and other risks.  Hepatitis C blood test.  Hepatitis B blood test.  Sexually transmitted disease (STD) testing.  Diabetes screening. This is done by checking your blood sugar (glucose) after you have not eaten for a while (fasting). You may have this done every 1-3 years.  Abdominal aortic aneurysm (AAA) screening. You may need this if you are a current or former smoker.  Osteoporosis. You may be screened starting at age 76 if you are at high risk. Talk with your health care provider about your test results, treatment options, and if necessary, the need for more tests. Vaccines  Your health care provider may recommend certain vaccines, such as:  Influenza vaccine. This is recommended every year.  Tetanus, diphtheria, and acellular pertussis (Tdap, Td) vaccine. You may need a Td booster every 10 years.  Zoster vaccine. You may need this after age  60.  Pneumococcal 13-valent conjugate (PCV13) vaccine. One dose is recommended after age 64.  Pneumococcal polysaccharide (PPSV23) vaccine. One dose is recommended after age 43. Talk to your health care provider  about which screenings and vaccines you need and how often you need them. This information is not intended to replace advice given to you by your health care provider. Make sure you discuss any questions you have with your health care provider. Document Released: 11/06/2015 Document Revised: 06/29/2016 Document Reviewed: 08/11/2015 Elsevier Interactive Patient Education  2017 [place] Prevention in the Home Falls can cause injuries. They can happen to people of all ages. There are many things you can do to make your home safe and to help prevent falls. What can I do on the outside of my home?  Regularly fix the edges of walkways and driveways and fix any cracks.  Remove anything that might make you trip as you walk through a door, such as a raised step or threshold.  Trim any bushes or trees on the path to your home.  Use bright outdoor lighting.  Clear any walking paths of anything that might make someone trip, such as rocks or tools.  Regularly check to see if handrails are loose or broken. Make sure that both sides of any steps have handrails.  Any raised decks and porches should have guardrails on the edges.  Have any leaves, snow, or ice cleared regularly.  Use sand or salt on walking paths during winter.  Clean up any spills in your garage right away. This includes oil or grease spills. What can I do in the bathroom?  Use night lights.  Install grab bars by the toilet and in the tub and shower. Do not use towel bars as grab bars.  Use non-skid mats or decals in the tub or shower.  If you need to sit down in the shower, use a plastic, non-slip stool.  Keep the floor dry. Clean up any water that spills on the floor as soon as it happens.  Remove soap buildup in the tub or shower regularly.  Attach bath mats securely with double-sided non-slip rug tape.  Do not have throw rugs and other things on the floor that can make you trip. What can I do in the  bedroom?  Use night lights.  Make sure that you have a light by your bed that is easy to reach.  Do not use any sheets or blankets that are too big for your bed. They should not hang down onto the floor.  Have a firm chair that has side arms. You can use this for support while you get dressed.  Do not have throw rugs and other things on the floor that can make you trip. What can I do in the kitchen?  Clean up any spills right away.  Avoid walking on wet floors.  Keep items that you use a lot in easy-to-reach places.  If you need to reach something above you, use a strong step stool that has a grab bar.  Keep electrical cords out of the way.  Do not use floor polish or wax that makes floors slippery. If you must use wax, use non-skid floor wax.  Do not have throw rugs and other things on the floor that can make you trip. What can I do with my stairs?  Do not leave any items on the stairs.  Make sure that there are handrails on both sides of the stairs and  use them. Fix handrails that are broken or loose. Make sure that handrails are as long as the stairways.  Check any carpeting to make sure that it is firmly attached to the stairs. Fix any carpet that is loose or worn.  Avoid having throw rugs at the top or bottom of the stairs. If you do have throw rugs, attach them to the floor with carpet tape.  Make sure that you have a light switch at the top of the stairs and the bottom of the stairs. If you do not have them, ask someone to add them for you. What else can I do to help prevent falls?  Wear shoes that:  Do not have high heels.  Have rubber bottoms.  Are comfortable and fit you well.  Are closed at the toe. Do not wear sandals.  If you use a stepladder:  Make sure that it is fully opened. Do not climb a closed stepladder.  Make sure that both sides of the stepladder are locked into place.  Ask someone to hold it for you, if possible.  Clearly mark and make  sure that you can see:  Any grab bars or handrails.  First and last steps.  Where the edge of each step is.  Use tools that help you move around (mobility aids) if they are needed. These include:  Canes.  Walkers.  Scooters.  Crutches.  Turn on the lights when you go into a dark area. Replace any light bulbs as soon as they burn out.  Set up your furniture so you have a clear path. Avoid moving your furniture around.  If any of your floors are uneven, fix them.  If there are any pets around you, be aware of where they are.  Review your medicines with your doctor. Some medicines can make you feel dizzy. This can increase your chance of falling. Ask your doctor what other things that you can do to help prevent falls. This information is not intended to replace advice given to you by your health care provider. Make sure you discuss any questions you have with your health care provider. Document Released: 08/06/2009 Document Revised: 03/17/2016 Document Reviewed: 11/14/2014 Elsevier Interactive Patient Education  2017 [employer].

## 2017-02-10 NOTE — Progress Notes (Signed)
Subjective:   Luke Durham is a 83 y.o. male who presents for Medicare Annual/Subsequent preventive examination.  Review of Systems:  N/A Cardiac Risk Factors include: advanced age (>108men, >65 women)     Objective:    Vitals: BP 108/62 (BP Location: Right Arm)   Pulse 80   Temp 97.6 F (36.4 C) (Oral)   Ht 5\' 6"  (1.676 m)   Wt 173 lb (78.5 kg)   BMI 27.92 kg/m   Body mass index is 27.92 kg/m.  Tobacco History  Smoking Status  . Never Smoker  Smokeless Tobacco  . Never Used     Counseling given: Not Answered   Past Medical History:  Diagnosis Date  . Anxiety   . Arthritis   . Asthma    childhood asthma  . Cancer ([place]) 7 years ago   lymphoma, [place] (recent)  . Chronic kidney disease   . Colon polyps   . GERD (gastroesophageal reflux disease)   . History of kidney stones   . Parkinson disease [facility])    Past Surgical History:  Procedure Laterality Date  . COLON SURGERY    . CYSTOSCOPY WITH LITHOLAPAXY N/A 11/14/2016   Procedure: CYSTOSCOPY WITH LITHOLAPAXY;  Surgeon: Hollice Espy, MD;  Location: [facility] ORS;  Service: Urology;  Laterality: N/A;  . EYE SURGERY Bilateral    Cataract Extraction with IOL  . HERNIA REPAIR  20 years  . MOHS SURGERY    . SMALL INTESTINE SURGERY     Per patient 7 years  . TRANSURETHRAL RESECTION OF PROSTATE N/A 11/14/2016   Procedure: TRANSURETHRAL RESECTION OF THE PROSTATE (TURP);  Surgeon: Hollice Espy, MD;  Location: [facility] ORS;  Service: Urology;  Laterality: N/A;   Family History  Problem Relation Age of Onset  . Heart disease Father   . Bladder Cancer Neg Hx   . Prostate cancer Neg Hx   . Kidney cancer Neg Hx    History  Sexual Activity  . Sexual activity: Not on file    Outpatient Encounter Prescriptions as of 02/10/2017  Medication Sig  . Amantadine HCl 100 MG tablet Take 100 mg by mouth daily.  . carbidopa-levodopa (SINEMET IR) 25-100 MG tablet take 2 tablets by mouth four times a day as directed (Patient taking  differently: Take 2 tablets by mouth 4 (four) times daily. )  . Carboxymethylcellulose Sodium (THERATEARS OP) Place 1-2 drops into both eyes at bedtime.  . docusate sodium (COLACE) 100 MG capsule Take 1 capsule (100 mg total) by mouth 2 (two) times daily.  Marland Kitchen donepezil (ARICEPT) 10 MG tablet Take 10 mg by mouth at bedtime.   Marland Kitchen econazole nitrate 1 % cream Apply 1 application topically daily. Applied to feet  . furosemide (LASIX) 20 MG tablet Take 1 tablet (20 mg total) by mouth daily.  . Multiple Vitamins-Minerals (OCUVITE PO) Take 1 tablet by mouth daily.   . niacinamide 500 MG tablet   . Pimavanserin Tartrate (NUPLAZID) 17 MG TABS Take 17 mg by mouth daily.   . sertraline (ZOLOFT) 50 MG tablet Take 1 tablet (50 mg total) by mouth daily.  . tamsulosin (FLOMAX) 0.4 MG CAPS capsule Take 1 capsule (0.4 mg total) by mouth daily.   No facility-administered encounter medications on file as of 02/10/2017.     Activities of Daily Living In your present state of health, do you have any difficulty performing the following activities: 02/10/2017 11/14/2016  Hearing? N -  Vision? N -  Difficulty concentrating or making decisions? Y -  Walking or climbing stairs? Y -  Dressing or bathing? Y -  Doing errands, shopping? Y N  Preparing Food and eating ? Y -  Using the Toilet? N -  In the past six months, have you accidently leaked urine? Y -  Do you have problems with loss of bowel control? N -  Managing your Medications? Y -  Managing your Finances? Y -  Housekeeping or managing your Housekeeping? Y -    Patient Care Team: Mar Daring, PA-C as PCP - General (Family Medicine)   Assessment:     Exercise Activities and Dietary recommendations Current Exercise Habits: The patient does not participate in regular exercise at present;Structured exercise class (therapy class, Pt to start OT class 4 times a week for 4 weeks for 1 hour starting on 02/13/17), Type of exercise: stretching;strength  training/weights, Time (Minutes): 45, Frequency (Times/Week): 1, Weekly Exercise (Minutes/Week): 45, Intensity: Mild, Exercise limited by: Other - see comments (Parkisons)  Goals    . Increase water intake          Recommend increasing water intake 4-6 glasses of water a day.      Fall Risk Fall Risk  02/10/2017  Falls in the past year? Yes  Number falls in past yr: 2 or more  Injury with Fall? No  Risk for fall due to : Impaired mobility  Follow up Falls prevention discussed   Depression Screen PHQ 2/9 Scores 02/10/2017  PHQ - 2 Score 3  PHQ- 9 Score 16    Cognitive Function        Immunization History  Administered Date(s) Administered  . Influenza-Unspecified 07/24/2016  . Pneumococcal Conjugate-13 12/16/2016   Screening Tests Health Maintenance  Topic Date Due  . TETANUS/TDAP  03/24/1954  . INFLUENZA VACCINE  05/24/2017  . PNA vac Low Risk Adult (2 of 2 - PPSV23) 12/16/2017      Plan:  I have personally reviewed and addressed the Medicare Annual Wellness questionnaire and have noted the following in the patient's chart:  A. Medical and social history B. Use of alcohol, tobacco or illicit drugs  C. Current medications and supplements D. Functional ability and status E.  Nutritional status F.  Physical activity G. Advance directives H. List of other physicians I.  Hospitalizations, surgeries, and ER visits in previous 12 months J.  [place] such as hearing and vision if needed, cognitive and depression L. Referrals and appointments - none  In addition, I have reviewed and discussed with patient certain preventive protocols, quality metrics, and best practice recommendations. A written personalized care plan for preventive services as well as general preventive health recommendations were provided to patient.  See attached scanned questionnaire for additional information.   Signed,  Fabio Neighbors, LPN Nurse Health Advisor   MD  Recommendations: None   I have reviewed the documentation and information obtained by Fabio Neighbors, LPN in the above chart and agree as above. I was available for consultation if any questions or issues arose.  Fenton Malling, PA-C

## 2017-02-10 NOTE — Therapy (Signed)
[place] MAIN [facility] SERVICES [address] [place], [place], [address] Phone: [phone]   Fax:  [phone]  Occupational Therapy Treatment  Patient Details  Name: Luke Durham MRN: [id number] Date of Birth: 06/26/1933 Referring Provider: Marlyn Corporal  Encounter Date: 02/08/2017      OT End of Session - 02/10/17 1434    Visit Number 12   Number of Visits 29   Date for OT Re-Evaluation 03/29/17   Authorization Type Medicare visit 12   OT Start Time 1115   OT Stop Time 1200   OT Time Calculation (min) 45 min   Activity Tolerance Patient tolerated treatment well   Behavior During Therapy [facility] for tasks assessed/performed      Past Medical History:  Diagnosis Date  . Anxiety   . Arthritis   . Asthma    childhood asthma  . Cancer ([place]) 7 years ago   lymphoma, [place] (recent)  . Chronic kidney disease   . Colon polyps   . GERD (gastroesophageal reflux disease)   . History of kidney stones   . Parkinson disease [facility])     Past Surgical History:  Procedure Laterality Date  . COLON SURGERY    . CYSTOSCOPY WITH LITHOLAPAXY N/A 11/14/2016   Procedure: CYSTOSCOPY WITH LITHOLAPAXY;  Surgeon: Hollice Espy, MD;  Location: [facility] ORS;  Service: Urology;  Laterality: N/A;  . EYE SURGERY Bilateral    Cataract Extraction with IOL  . HERNIA REPAIR  20 years  . MOHS SURGERY    . SMALL INTESTINE SURGERY     Per patient 7 years  . TRANSURETHRAL RESECTION OF PROSTATE N/A 11/14/2016   Procedure: TRANSURETHRAL RESECTION OF THE PROSTATE (TURP);  Surgeon: Hollice Espy, MD;  Location: [facility] ORS;  Service: Urology;  Laterality: N/A;    There were no vitals filed for this visit.      Subjective Assessment - 02/10/17 1432    Subjective  Patient finishing up physical therapy sessions and will transition to [place] program.  Patient reports he feels a bit intimidated by the frequency of therapy and still afraid of falls.    Patient is accompained by:  Family member   Pertinent History Patient reports he was diagnosed with Parkinson's disease about 10 plus years ago.  He reports a more recent decline in function in his daily activities in the last 6-12 months.  He has been seeing PT for the last couple months.   Patient Stated Goals Patient reports he wants to be able to do more for himself and around the house.    Currently in Pain? No/denies   Pain Score 0-No pain                      OT Treatments/Exercises (OP) - 02/10/17 1456      Fine Motor Coordination   Other Fine Motor Exercises Reassessment of 9 hole peg test:  right 40 secs, left 50 secs which is significantly less than at evaluation.       Neurological Re-education Exercises   Other Exercises 1 Patient seen for 6 minute walk test, 920 feet.  Patient ambulating with walker, decreased step length and size, significant freezing of gait with turning behaviors as well as managing in small spaces. Patient also demonstrates hesitations and freezing when approaching surfaces such as bench, chair as well as with initiation of gait at times.  5 times sit to stand 25 secs.  Freezing of gait questionairre, 16.  Other Exercises 2 Patient seen for resistive pinch exercises from seated position with yellow, red and green resistive pins and placing onto elevated plane of motion with cues for increased stregnth, reach and pinch.                 OT Education - 02/10/17 1433    Education provided Yes   Education Details LSVT BIG program, outcome measures, goals   Person(s) Educated Patient;Spouse   Methods Explanation;Demonstration;Verbal cues   Comprehension Verbal cues required;Returned demonstration;Verbalized understanding             OT Long Term Goals - 02/10/17 1501      OT LONG TERM GOAL #1   Title Patient will demonstrate increase right hand grip to be able to cut food with modified independence.    Baseline unable at eval   Time 4   Period Weeks    Status On-going     OT LONG TERM GOAL #2   Title Patient will complete donning long sleeve shirt with modified independence.   Baseline requires assist with long sleeves, can perform short sleeves.   Time 4   Period Weeks   Status On-going     OT LONG TERM GOAL #3   Title Patient will improve coordination to manage buttons on clothing with occasional assistance only.   Baseline requires assist with buttons each time   Time 4   Period Weeks   Status On-going     OT LONG TERM GOAL #4   Title Patient will complete managing pants after toileting with modified independence.    Baseline assist most days    Time 4   Period Weeks   Status On-going     OT LONG TERM GOAL #5   Title Patient will improve hand strength bilaterally to be able to don socks with modified independence.    Baseline unable at evaluation   Time 4   Period Weeks   Status On-going     Long Term Additional Goals   Additional Long Term Goals Yes     OT LONG TERM GOAL #6   Title Patient will improve gait speed and endurance and be able to walk 1050 feet in 6 minutes to negotiate around the home and community safely in 4 weeks   Baseline 920 feet prior to LSVT BIG   Time 4   Period Weeks   Status New     OT LONG TERM GOAL #7   Title Patient will complete HEP for maximal daily exercises with modified independence in 4 weeks   Baseline no current program for Parkinson's exercises   Time 4   Period Weeks   Status New     OT LONG TERM GOAL #8   Title Patient will transfer from sit to stand without the use of arms safely and independently from a variety of chairs/surfaces in 4 weeks.    Baseline difficulty from lower surfaces   Time 4   Period Weeks   Status New     OT LONG TERM GOAL  #9   Baseline Patient will demonstrate decreased episodes of freezing of behaviors from score of 16 to score of less than 12.   Time 4   Period Weeks   Status New               Plan - 02/10/17 1435    Clinical  Impression Statement Patient is a 83 yo male diagnosed with Parkinson's disease and was referred by his physician  for LSVT BIG program. Patient presents with falls in the past, decreased step length with gait patterns, decreased reciprocal arm swing, decreased balance,  decreased coordination, significant freezing of gait behaviors and muscle strength which affect his ability to perform daily tasks. The patient is judged to be an excellent candidate for the LSVT BIG program. He would benefit from and was referred for the LSVT BIG program which is an intensive program designed specifically for Parkinson's patients with a focus on increasing amplitude and speed of movements, improving self-care and daily tasks and providing patients with daily exercises to improve overall function. It is recommended that the patient receive the LSVT BIG program which is comprised of 16 intensive sessions (4 times per week for 4 weeks, one hour sessions). Prognosis for improvement is good based on his motivation and strong family support. LSVT BIG has been documented in the literature as efficacious for individuals with Parkinson's disease.  Patient transitioning next session from traditional OT approach to intensive LSVT BIG program with goals updated.     Rehab Potential Good   Clinical Impairments Affecting Rehab Potential positive: family support, negative: level of motivation, progressive disease   OT Frequency 4x / week   OT Duration 4 weeks   OT Treatment/Interventions Self-care/ADL training;Moist Heat;DME and/or AE instruction;Patient/family education;Therapeutic exercises;Balance training;Therapeutic exercise;Therapeutic activities;Neuromuscular education;Functional Mobility Training;Manual Therapy   Consulted and Agree with Plan of Care Patient;Family member/caregiver   Family Member Consulted wife      Patient will benefit from skilled therapeutic intervention in order to improve the following deficits and  impairments:  Abnormal gait, Decreased coordination, Decreased range of motion, Difficulty walking, Decreased endurance, Decreased safety awareness, Decreased activity tolerance, Decreased balance, Impaired UE functional use, Pain, Decreased mobility, Decreased strength  Visit Diagnosis: Muscle weakness (generalized)  Other lack of coordination  Unsteadiness on feet  Difficulty in walking, not elsewhere classified    Problem List Patient Active Problem List   Diagnosis Date Noted  . BPH with urinary obstruction 11/14/2016  . Urinary frequency 10/10/2016  . Microscopic hematuria 10/10/2016  . Parkinson's syndrome ([place]) 06/14/2016  . Arthritis 06/14/2016  . Depression 06/14/2016  . Heartburn 06/14/2016  . Urinary incontinence 06/14/2016  . Skin lesion 06/14/2016  . Dementia 06/14/2016  . BPH (benign prostatic hyperplasia) 06/14/2016  . Bilateral edema of lower extremity 06/14/2016   Achilles Dunk, OTR/L, CLT  Lovett,Amy 02/10/2017, 3:07 PM  [place] MAIN [facility] SERVICES [address] [place], [place], [address] Phone: [phone]   Fax:  [phone]  Name: Tyreese Thain MRN: [id number] Date of Birth: 08-May-1933

## 2017-02-10 NOTE — Progress Notes (Signed)
Patient: Luke Durham, Male    DOB: 06-Nov-1932, 83 y.o.   MRN: [id number] Visit Date: 02/10/2017  Today's Provider: Mar Daring, PA-C   Chief Complaint  Patient presents with  . Annual Exam   Subjective:    Annual physical exam Luke Durham is a 83 y.o. male who presents today for health maintenance and complete physical. He feels fairly well. He reports exercising none. He reports he is sleeping fairly well. ----------------------------------------------------------------  Review of Systems  Constitutional: Positive for fatigue.  HENT: Positive for drooling and rhinorrhea.   Eyes: Negative.        Dry eyes  Respiratory: Negative.   Cardiovascular: Negative.   Gastrointestinal: Positive for constipation.  Endocrine: Positive for cold intolerance.  Genitourinary: Positive for enuresis.  Musculoskeletal: Negative.        Right shoulder pain  Skin: Positive for rash (on chest).  Allergic/Immunologic: Negative.   Neurological: Positive for tremors (freezing tremor with parkinson), speech difficulty and weakness. Negative for dizziness, seizures, syncope and light-headedness.  Hematological: Bruises/bleeds easily.  Psychiatric/Behavioral: Positive for decreased concentration.  These are chronic issues  Social History      He  reports that he has never smoked. He has never used smokeless tobacco. He reports that he drinks alcohol. He reports that he does not use drugs.       Social History   Social History  . Marital status: Married    Spouse name: N/A  . Number of children: N/A  . Years of education: N/A   Social History Main Topics  . Smoking status: Never Smoker  . Smokeless tobacco: Never Used  . Alcohol use Yes     Comment: wine occassional  . Drug use: No  . Sexual activity: Not on file   Other Topics Concern  . Not on file   Social History Narrative  . No narrative on file    Past Medical History:  Diagnosis Date  . Anxiety   .  Arthritis   . Asthma    childhood asthma  . Cancer ([place]) 7 years ago   lymphoma, [place] (recent)  . Chronic kidney disease   . Colon polyps   . GERD (gastroesophageal reflux disease)   . History of kidney stones   . Parkinson disease [facility])      Patient Active Problem List   Diagnosis Date Noted  . BPH with urinary obstruction 11/14/2016  . Urinary frequency 10/10/2016  . Microscopic hematuria 10/10/2016  . Parkinson's syndrome ([place]) 06/14/2016  . Arthritis 06/14/2016  . Depression 06/14/2016  . Heartburn 06/14/2016  . Urinary incontinence 06/14/2016  . Skin lesion 06/14/2016  . Dementia 06/14/2016  . BPH (benign prostatic hyperplasia) 06/14/2016  . Bilateral edema of lower extremity 06/14/2016    Past Surgical History:  Procedure Laterality Date  . COLON SURGERY    . CYSTOSCOPY WITH LITHOLAPAXY N/A 11/14/2016   Procedure: CYSTOSCOPY WITH LITHOLAPAXY;  Surgeon: Hollice Espy, MD;  Location: [facility] ORS;  Service: Urology;  Laterality: N/A;  . EYE SURGERY Bilateral    Cataract Extraction with IOL  . HERNIA REPAIR  20 years  . MOHS SURGERY    . SMALL INTESTINE SURGERY     Per patient 7 years  . TRANSURETHRAL RESECTION OF PROSTATE N/A 11/14/2016   Procedure: TRANSURETHRAL RESECTION OF THE PROSTATE (TURP);  Surgeon: Hollice Espy, MD;  Location: [facility] ORS;  Service: Urology;  Laterality: N/A;    Family History  Family Status  Relation Status  . Mother Deceased  . Father Deceased  . Neg Hx         His family history includes Heart disease in his father.     No Known Allergies   Current Outpatient Prescriptions:  .  Amantadine HCl 100 MG tablet, Take 100 mg by mouth daily., Disp: , Rfl:  .  carbidopa-levodopa (SINEMET IR) 25-100 MG tablet, take 2 tablets by mouth four times a day as directed (Patient taking differently: Take 2 tablets by mouth 4 (four) times daily. ), Disp: 270 tablet, Rfl: 11 .  Carboxymethylcellulose Sodium (THERATEARS OP), Place 1-2 drops into  both eyes at bedtime., Disp: , Rfl:  .  docusate sodium (COLACE) 100 MG capsule, Take 1 capsule (100 mg total) by mouth 2 (two) times daily., Disp: 60 capsule, Rfl: 11 .  donepezil (ARICEPT) 10 MG tablet, Take 10 mg by mouth at bedtime. , Disp: , Rfl:  .  econazole nitrate 1 % cream, Apply 1 application topically daily. Applied to feet, Disp: , Rfl:  .  furosemide (LASIX) 20 MG tablet, Take 1 tablet (20 mg total) by mouth daily. (Patient taking differently: Take 20 mg by mouth daily. ), Disp: 90 tablet, Rfl: 3 .  Multiple Vitamin (MULTIVITAMIN) capsule, Take 1 capsule by mouth daily., Disp: , Rfl:  .  Multiple Vitamins-Minerals (OCUVITE PO), Take 1 tablet by mouth daily. , Disp: , Rfl:  .  niacinamide 500 MG tablet, , Disp: , Rfl: 0 .  Pimavanserin Tartrate (NUPLAZID) 17 MG TABS, Take 17 mg by mouth daily. , Disp: , Rfl:  .  sertraline (ZOLOFT) 50 MG tablet, Take 1 tablet (50 mg total) by mouth daily., Disp: 90 tablet, Rfl: 3 .  tamsulosin (FLOMAX) 0.4 MG CAPS capsule, Take 1 capsule (0.4 mg total) by mouth daily., Disp: 90 capsule, Rfl: 3   Patient Care Team: Mar Daring, PA-C as PCP - General (Family Medicine) Vladimir Crofts, MD as Consulting Physician (Neurology) Leandrew Koyanagi, MD as Referring Physician (Ophthalmology) Corky Mull, MD as Consulting Physician (Surgery) Merril Abbe, MD as Referring Physician (Dermatology) Ethlyn Gallery, MD as Referring Physician (Dermatology) Hollice Espy, MD as Consulting Physician (Urology)      Objective:   Vitals: BP 108/62 (BP Location: Right Arm)   Pulse 80   Temp 97.6 F (36.4 C) (Oral)   Ht 5\' 6"  (1.676 m)   Wt 173 lb (78.5 kg)   BMI 27.92 kg/m   Body mass index is 27.92 kg/m.   Physical Exam  Constitutional: He is oriented to person, place, and time. He appears well-developed and well-nourished.  HENT:  Head: Normocephalic and atraumatic.  Right Ear: External ear normal.  Left Ear: External ear normal.    Nose: Nose normal.  Mouth/Throat: Oropharynx is clear and moist.  Eyes: Conjunctivae and EOM are normal. Pupils are equal, round, and reactive to light. Right eye exhibits no discharge.  Neck: Normal range of motion. Neck supple. No tracheal deviation present. No thyromegaly present.  Cardiovascular: Normal rate, regular rhythm, normal heart sounds and intact distal pulses.   No murmur heard. Pulmonary/Chest: Effort normal and breath sounds normal. No respiratory distress. He has no wheezes. He has no rales. He exhibits no tenderness.  Abdominal: Soft. He exhibits no distension and no mass. There is no tenderness. There is no rebound and no guarding.  Musculoskeletal: Normal range of motion. He exhibits edema (trace edema bilaterally). He exhibits no tenderness.  Lymphadenopathy:  He has no cervical adenopathy.  Neurological: He is alert and oriented to person, place, and time. He has normal reflexes. No cranial nerve deficit. He exhibits normal muscle tone. Coordination normal.  Skin: Skin is warm and dry. No rash noted. No erythema.  Psychiatric: He has a normal mood and affect. His behavior is normal. Judgment and thought content normal.     Depression Screen PHQ 2/9 Scores 02/10/2017  PHQ - 2 Score 3  PHQ- 9 Score 16      Assessment & Plan:     Routine Health Maintenance and Physical Exam  Exercise Activities and Dietary recommendations Goals    . Increase water intake          Recommend increasing water intake 4-6 glasses of water a day.       Immunization History  Administered Date(s) Administered  . Influenza-Unspecified 07/24/2016  . Pneumococcal Conjugate-13 12/16/2016    Health Maintenance  Topic Date Due  . TETANUS/TDAP  10/24/2026 (Originally 03/24/1954)  . INFLUENZA VACCINE  05/24/2017  . PNA vac Low Risk Adult (2 of 2 - PPSV23) 12/16/2017     Discussed health benefits of physical activity, and encouraged him to engage in regular exercise appropriate  for his age and condition.    1. Annual physical exam Normal physical exam with exception of stooped posture and freezing with parkinson's. Will check labs as below and f/u pending results.  - CBC w/Diff/Platelet - Comprehensive Metabolic Panel (CMET) - Lipid Profile  2. Parkinson's syndrome ([place]) Stable. Followed by Dr. Manuella Ghazi, Neurology. Is undergoing intensive PT as well. Starts movement disorder therapy next week.   3. BPH with urinary obstruction Stable. Followed by Dr. Erlene Quan, Urology.  4. Bilateral edema of lower extremity Improved today. Secondary to dependent positioning of legs.   5. Encounter for lipid screening for cardiovascular disease Will check labs as below and f/u pending results. - Lipid Profile  --------------------------------------------------------------------    Mar Daring, PA-C  [facility]

## 2017-02-13 ENCOUNTER — Ambulatory Visit: Payer: Medicare Other | Admitting: Occupational Therapy

## 2017-02-14 ENCOUNTER — Ambulatory Visit: Payer: Medicare Other | Admitting: Occupational Therapy

## 2017-02-14 ENCOUNTER — Encounter: Payer: Self-pay | Admitting: Occupational Therapy

## 2017-02-14 DIAGNOSIS — R2681 Unsteadiness on feet: Secondary | ICD-10-CM

## 2017-02-14 DIAGNOSIS — R278 Other lack of coordination: Secondary | ICD-10-CM

## 2017-02-14 DIAGNOSIS — R293 Abnormal posture: Secondary | ICD-10-CM

## 2017-02-14 DIAGNOSIS — M6281 Muscle weakness (generalized): Secondary | ICD-10-CM | POA: Diagnosis not present

## 2017-02-14 DIAGNOSIS — R262 Difficulty in walking, not elsewhere classified: Secondary | ICD-10-CM

## 2017-02-15 ENCOUNTER — Ambulatory Visit: Payer: Medicare Other | Admitting: Physical Therapy

## 2017-02-15 ENCOUNTER — Encounter: Payer: Self-pay | Admitting: Occupational Therapy

## 2017-02-15 ENCOUNTER — Ambulatory Visit: Payer: Medicare Other | Admitting: Occupational Therapy

## 2017-02-15 ENCOUNTER — Encounter: Payer: Medicare Other | Admitting: Occupational Therapy

## 2017-02-15 DIAGNOSIS — R278 Other lack of coordination: Secondary | ICD-10-CM

## 2017-02-15 DIAGNOSIS — M6281 Muscle weakness (generalized): Secondary | ICD-10-CM | POA: Diagnosis not present

## 2017-02-15 DIAGNOSIS — R262 Difficulty in walking, not elsewhere classified: Secondary | ICD-10-CM

## 2017-02-15 DIAGNOSIS — R2681 Unsteadiness on feet: Secondary | ICD-10-CM

## 2017-02-15 DIAGNOSIS — R293 Abnormal posture: Secondary | ICD-10-CM

## 2017-02-15 NOTE — Therapy (Signed)
[place] MAIN [facility] SERVICES [address] [place], [place], [address] Phone: [phone]   Fax:  [phone]  Occupational Therapy Treatment  Patient Details  Name: Luke Durham MRN: [id number] Date of Birth: 12/25/1932 Referring Provider: Marlyn Corporal  Encounter Date: 02/14/2017      OT End of Session - 02/14/17 1537    Visit Number 13   Number of Visits 29   Date for OT Re-Evaluation 03/29/17   Authorization Type Medicare visit 5   OT Start Time 1300   OT Stop Time 1402   OT Time Calculation (min) 62 min   Activity Tolerance Patient tolerated treatment well   Behavior During Therapy [facility] for tasks assessed/performed      Past Medical History:  Diagnosis Date  . Anxiety   . Arthritis   . Asthma    childhood asthma  . Cancer ([place]) 7 years ago   lymphoma, [place] (recent)  . Chronic kidney disease   . Colon polyps   . GERD (gastroesophageal reflux disease)   . History of kidney stones   . Parkinson disease [facility])     Past Surgical History:  Procedure Laterality Date  . COLON SURGERY    . CYSTOSCOPY WITH LITHOLAPAXY N/A 11/14/2016   Procedure: CYSTOSCOPY WITH LITHOLAPAXY;  Surgeon: Hollice Espy, MD;  Location: [facility] ORS;  Service: Urology;  Laterality: N/A;  . EYE SURGERY Bilateral    Cataract Extraction with IOL  . HERNIA REPAIR  20 years  . MOHS SURGERY    . SMALL INTESTINE SURGERY     Per patient 7 years  . TRANSURETHRAL RESECTION OF PROSTATE N/A 11/14/2016   Procedure: TRANSURETHRAL RESECTION OF THE PROSTATE (TURP);  Surgeon: Hollice Espy, MD;  Location: [facility] ORS;  Service: Urology;  Laterality: N/A;    There were no vitals filed for this visit.      Subjective Assessment - 02/14/17 1535    Subjective  Patient reports they got mixed up with the time yesterday for therapy and didn't realize they missed his appointment until the secretary called. They have rescheduled for Friday to make up the day missed. Patient  reports he is anxious at times, not sure why but he is ready to get started today. "You have to put up with me for an hour today."   Patient is accompained by: Family member   Pertinent History Patient reports he was diagnosed with Parkinson's disease about 10 plus years ago.  He reports a more recent decline in function in his daily activities in the last 6-12 months.  He has been seeing PT for the last couple months.   Patient Stated Goals Patient reports he wants to be able to do more for himself and around the house.    Currently in Pain? No/denies   Pain Score 0-No pain                      OT Treatments/Exercises (OP) - 02/14/17 1659      Neurological Re-education Exercises   Other Exercises 1 Patient seen for initial instruction of LSVT BIG exercises: LSVT Daily Session Maximal Daily Exercises: Sustained movements are designed to rescale the amplitude of movement output for generalization to daily functional activities. Performed as follows for 1 set of 10 repetitions each: Multi directional sustained movements- 1) Floor to ceiling, 2) Side to side. Multi directional Repetitive movements performed in standing and are designed to provide retraining effort needed for sustained muscle activation in tasks Performed  as follows: 3) Step and reach forward, 4) Step and Reach Backwards, 5) Step and reach sideways, 6) Rock and reach forward/backward, 7) Rock and reach sideways. Patient requires min assist for all exercises, moderate verbal and tactile cues provided.  Added posture exercise at the wall with CGA, verbal cues and tactile cues for shoulder retraction and head position for 5 reps with a 10 sec hold.                  OT Education - 02/14/17 1537    Education provided Yes   Education Details LSVT BIG maximal daily exercises, posture exercises.   Person(s) Educated Patient;Spouse   Methods Explanation;Demonstration;Verbal cues   Comprehension Verbal cues  required;Returned demonstration;Verbalized understanding             OT Long Term Goals - 02/10/17 1501      OT LONG TERM GOAL #1   Title Patient will demonstrate increase right hand grip to be able to cut food with modified independence.    Baseline unable at eval   Time 4   Period Weeks   Status On-going     OT LONG TERM GOAL #2   Title Patient will complete donning long sleeve shirt with modified independence.   Baseline requires assist with long sleeves, can perform short sleeves.   Time 4   Period Weeks   Status On-going     OT LONG TERM GOAL #3   Title Patient will improve coordination to manage buttons on clothing with occasional assistance only.   Baseline requires assist with buttons each time   Time 4   Period Weeks   Status On-going     OT LONG TERM GOAL #4   Title Patient will complete managing pants after toileting with modified independence.    Baseline assist most days    Time 4   Period Weeks   Status On-going     OT LONG TERM GOAL #5   Title Patient will improve hand strength bilaterally to be able to don socks with modified independence.    Baseline unable at evaluation   Time 4   Period Weeks   Status On-going     Long Term Additional Goals   Additional Long Term Goals Yes     OT LONG TERM GOAL #6   Title Patient will improve gait speed and endurance and be able to walk 1050 feet in 6 minutes to negotiate around the home and community safely in 4 weeks   Baseline 920 feet prior to LSVT BIG   Time 4   Period Weeks   Status New     OT LONG TERM GOAL #7   Title Patient will complete HEP for maximal daily exercises with modified independence in 4 weeks   Baseline no current program for Parkinson's exercises   Time 4   Period Weeks   Status New     OT LONG TERM GOAL #8   Title Patient will transfer from sit to stand without the use of arms safely and independently from a variety of chairs/surfaces in 4 weeks.    Baseline difficulty from  lower surfaces   Time 4   Period Weeks   Status New     OT LONG TERM GOAL  #9   Baseline Patient will demonstrate decreased episodes of freezing of behaviors from score of 16 to score of less than 12.   Time 4   Period Weeks   Status New  Plan - 02/14/17 1538    Clinical Impression Statement Patient seen for initial instruction of LSVT maximal daily exercises this date, requiring min assist for balance in standing as well as moderate verbal and tactile cues.  Patient requires increased assistance with stepping backwards as well as rock and reach exercise this date.  Patient required a few short rest breaks this date but demonstrated understanding of exercises and could follow commands.  He demonstrates forwards flexed posture with all tasks which places him at greater risk for falls.  Will continue to focus on posture in addition to daily exercises.    Rehab Potential Good   Clinical Impairments Affecting Rehab Potential positive: family support, negative: level of motivation, progressive disease   OT Frequency 4x / week   OT Duration 4 weeks   OT Treatment/Interventions Self-care/ADL training;Moist Heat;DME and/or AE instruction;Patient/family education;Therapeutic exercises;Balance training;Therapeutic exercise;Therapeutic activities;Neuromuscular education;Functional Mobility Training;Manual Therapy   Consulted and Agree with Plan of Care Patient;Family member/caregiver   Family Member Consulted wife      Patient will benefit from skilled therapeutic intervention in order to improve the following deficits and impairments:  Abnormal gait, Decreased coordination, Decreased range of motion, Difficulty walking, Decreased endurance, Decreased safety awareness, Decreased activity tolerance, Decreased balance, Impaired UE functional use, Pain, Decreased mobility, Decreased strength  Visit Diagnosis: Difficulty in walking, not elsewhere classified  Unsteadiness on  feet  Muscle weakness (generalized)  Other lack of coordination  Abnormal posture    Problem List Patient Active Problem List   Diagnosis Date Noted  . BPH with urinary obstruction 11/14/2016  . Urinary frequency 10/10/2016  . Microscopic hematuria 10/10/2016  . Parkinson's syndrome ([place]) 06/14/2016  . Arthritis 06/14/2016  . Depression 06/14/2016  . Heartburn 06/14/2016  . Urinary incontinence 06/14/2016  . Skin lesion 06/14/2016  . Dementia 06/14/2016  . Bilateral edema of lower extremity 06/14/2016   Achilles Dunk, OTR/L, CLT \ Lovett,Amy 02/15/2017, 5:05 PM  [place] MAIN [facility] SERVICES [address] [place], [place], [address] Phone: [phone]   Fax:  [phone]  Name: Luke Durham MRN: [id number] Date of Birth: 1933-01-17

## 2017-02-16 ENCOUNTER — Ambulatory Visit: Payer: Medicare Other | Admitting: Occupational Therapy

## 2017-02-16 DIAGNOSIS — R262 Difficulty in walking, not elsewhere classified: Secondary | ICD-10-CM

## 2017-02-16 DIAGNOSIS — R293 Abnormal posture: Secondary | ICD-10-CM

## 2017-02-16 DIAGNOSIS — M6281 Muscle weakness (generalized): Secondary | ICD-10-CM

## 2017-02-16 DIAGNOSIS — R278 Other lack of coordination: Secondary | ICD-10-CM

## 2017-02-16 DIAGNOSIS — R2681 Unsteadiness on feet: Secondary | ICD-10-CM

## 2017-02-17 ENCOUNTER — Encounter: Payer: Self-pay | Admitting: Occupational Therapy

## 2017-02-17 ENCOUNTER — Ambulatory Visit: Payer: Medicare Other | Admitting: Occupational Therapy

## 2017-02-17 DIAGNOSIS — R293 Abnormal posture: Secondary | ICD-10-CM

## 2017-02-17 DIAGNOSIS — R2681 Unsteadiness on feet: Secondary | ICD-10-CM

## 2017-02-17 DIAGNOSIS — R278 Other lack of coordination: Secondary | ICD-10-CM

## 2017-02-17 DIAGNOSIS — M6281 Muscle weakness (generalized): Secondary | ICD-10-CM | POA: Diagnosis not present

## 2017-02-17 DIAGNOSIS — R262 Difficulty in walking, not elsewhere classified: Secondary | ICD-10-CM

## 2017-02-17 NOTE — Therapy (Signed)
[place] MAIN [facility] SERVICES [address] [place], [place], [address] Phone: [phone]   Fax:  [phone]  Occupational Therapy Treatment  Patient Details  Name: Luke Durham MRN: [id number] Date of Birth: June 09, 1933 Referring Provider: Marlyn Corporal  Encounter Date: 02/16/2017      OT End of Session - 02/17/17 1722    Visit Number 15   Number of Visits 29   Date for OT Re-Evaluation 03/29/17   Authorization Type Medicare visit 15   OT Start Time 1255   OT Stop Time 1400   OT Time Calculation (min) 65 min   Activity Tolerance Patient tolerated treatment well   Behavior During Therapy [facility] for tasks assessed/performed      Past Medical History:  Diagnosis Date  . Anxiety   . Arthritis   . Asthma    childhood asthma  . Cancer ([place]) 7 years ago   lymphoma, [place] (recent)  . Chronic kidney disease   . Colon polyps   . GERD (gastroesophageal reflux disease)   . History of kidney stones   . Parkinson disease [facility])     Past Surgical History:  Procedure Laterality Date  . COLON SURGERY    . CYSTOSCOPY WITH LITHOLAPAXY N/A 11/14/2016   Procedure: CYSTOSCOPY WITH LITHOLAPAXY;  Surgeon: Hollice Espy, MD;  Location: [facility] ORS;  Service: Urology;  Laterality: N/A;  . EYE SURGERY Bilateral    Cataract Extraction with IOL  . HERNIA REPAIR  20 years  . MOHS SURGERY    . SMALL INTESTINE SURGERY     Per patient 7 years  . TRANSURETHRAL RESECTION OF PROSTATE N/A 11/14/2016   Procedure: TRANSURETHRAL RESECTION OF THE PROSTATE (TURP);  Surgeon: Hollice Espy, MD;  Location: [facility] ORS;  Service: Urology;  Laterality: N/A;    There were no vitals filed for this visit.      Subjective Assessment - 02/17/17 1720    Subjective  Patient reports therapy is going better than he anticipated this week, he feels he has done well with coming 4 days a week and has seen some progress in select areas.    Patient is accompained by: Family member   Pertinent History Patient reports he was diagnosed with Parkinson's disease about 10 plus years ago.  He reports a more recent decline in function in his daily activities in the last 6-12 months.  He has been seeing PT for the last couple months.   Patient Stated Goals Patient reports he wants to be able to do more for himself and around the house.    Currently in Pain? No/denies   Pain Score 0-No pain                      OT Treatments/Exercises (OP) - 02/17/17 1723      Neurological Re-education Exercises   Other Exercises 1 Patient seen for instruction of LSVT BIG exercises: LSVT Daily Session Maximal Daily Exercises: Sustained movements are designed to rescale the amplitude of movement output for generalization to daily functional activities. Performed as follows for 1 set of 10 repetitions each: Multi directional sustained movements- 1) Floor to ceiling, 2) Side to side. Multi directional Repetitive movements performed in standing and are designed to provide retraining effort needed for sustained muscle activation in tasks Performed as follows: 3) Step and reach forward, 4) Step and Reach Backwards, 5) Step and reach sideways, 6) Rock and reach forward/backward, 7) Rock and reach sideways. Patient requires min assist for all  exercises, moderate verbal and tactile cues provided.  Posture exercise at the wall with CGA, verbal cues and tactile cues for shoulder retraction and head position for 5 reps with a 10 sec hold, therapist modeling provided.  Also implemented a visual barrier in which patient had to improve posture to be able to see past. Reciprocal toe tapping at staircase for 10 repetitions each side with CGA assist and verbal cues, therapist demonstration.  Patient seen for quarter turns with moderate verbal cues, therapist demo and CGA assist.     Other Exercises 2 Manual stretch to patients chest/pectoral area with prolonged stretch from therapist to modify posture to affect  balance. Maximal cues for postural, head position during all tasks. "Stop/go" trials with initiation and termination of gait patterns to affect freezing behaviors which were greatly diminished this date. Continue to work towards goals to improve performance in daily exercises, posture, functional transfers, gait and balance to increase independence in daily tasks.                 OT Education - 02/17/17 1721    Education provided Yes   Education Details continued instruction on daily exercises, turning behaviors, sit to stand with improved posture.    Person(s) Educated Patient;Spouse   Methods Explanation;Demonstration;Verbal cues;Tactile cues   Comprehension Verbalized understanding;Returned demonstration;Tactile cues required;Verbal cues required             OT Long Term Goals - 02/10/17 1501      OT LONG TERM GOAL #1   Title Patient will demonstrate increase right hand grip to be able to cut food with modified independence.    Baseline unable at eval   Time 4   Period Weeks   Status On-going     OT LONG TERM GOAL #2   Title Patient will complete donning long sleeve shirt with modified independence.   Baseline requires assist with long sleeves, can perform short sleeves.   Time 4   Period Weeks   Status On-going     OT LONG TERM GOAL #3   Title Patient will improve coordination to manage buttons on clothing with occasional assistance only.   Baseline requires assist with buttons each time   Time 4   Period Weeks   Status On-going     OT LONG TERM GOAL #4   Title Patient will complete managing pants after toileting with modified independence.    Baseline assist most days    Time 4   Period Weeks   Status On-going     OT LONG TERM GOAL #5   Title Patient will improve hand strength bilaterally to be able to don socks with modified independence.    Baseline unable at evaluation   Time 4   Period Weeks   Status On-going     Long Term Additional Goals    Additional Long Term Goals Yes     OT LONG TERM GOAL #6   Title Patient will improve gait speed and endurance and be able to walk 1050 feet in 6 minutes to negotiate around the home and community safely in 4 weeks   Baseline 920 feet prior to LSVT BIG   Time 4   Period Weeks   Status New     OT LONG TERM GOAL #7   Title Patient will complete HEP for maximal daily exercises with modified independence in 4 weeks   Baseline no current program for Parkinson's exercises   Time 4   Period Weeks  Status New     OT LONG TERM GOAL #8   Title Patient will transfer from sit to stand without the use of arms safely and independently from a variety of chairs/surfaces in 4 weeks.    Baseline difficulty from lower surfaces   Time 4   Period Weeks   Status New     OT LONG TERM GOAL  #9   Baseline Patient will demonstrate decreased episodes of freezing of behaviors from score of 16 to score of less than 12.   Time 4   Period Weeks   Status New               Plan - 02/17/17 1722    Clinical Impression Statement Patient responds well to cues this week with implementation of LSVT BIG exercises and principles.  Continued focus on impacting patient's posture and body position while performing exercises and functional mobility skills to reduce risk of falls. Patient required mirroring techniques for sit to stand with therapist providing manual skills at hips and trunk for positioning for optimal stance. Patient continues to benefit from skilled OT to improve balance, functional mobility and independence in daily tasks while also reducing risk of falls.    Rehab Potential Good   Clinical Impairments Affecting Rehab Potential positive: family support, negative: level of motivation, progressive disease   OT Frequency 4x / week   OT Duration 4 weeks   OT Treatment/Interventions Self-care/ADL training;Moist Heat;DME and/or AE instruction;Patient/family education;Therapeutic exercises;Balance  training;Therapeutic exercise;Therapeutic activities;Neuromuscular education;Functional Mobility Training;Manual Therapy   Consulted and Agree with Plan of Care Patient;Family member/caregiver   Family Member Consulted wife      Patient will benefit from skilled therapeutic intervention in order to improve the following deficits and impairments:  Abnormal gait, Decreased coordination, Decreased range of motion, Difficulty walking, Decreased endurance, Decreased safety awareness, Decreased activity tolerance, Decreased balance, Impaired UE functional use, Pain, Decreased mobility, Decreased strength  Visit Diagnosis: Difficulty in walking, not elsewhere classified  Unsteadiness on feet  Muscle weakness (generalized)  Other lack of coordination  Abnormal posture    Problem List Patient Active Problem List   Diagnosis Date Noted  . BPH with urinary obstruction 11/14/2016  . Urinary frequency 10/10/2016  . Microscopic hematuria 10/10/2016  . Parkinson's syndrome ([place]) 06/14/2016  . Arthritis 06/14/2016  . Depression 06/14/2016  . Heartburn 06/14/2016  . Urinary incontinence 06/14/2016  . Skin lesion 06/14/2016  . Dementia 06/14/2016  . Bilateral edema of lower extremity 06/14/2016   Achilles Dunk, OTR/L, CLT  Harald Quevedo 02/17/2017, 5:37 PM  [place] MAIN [facility] SERVICES [address] [place], [place], [address] Phone: [phone]   Fax:  [phone]  Name: Asiel Chrostowski MRN: [id number] Date of Birth: 1932-11-19

## 2017-02-17 NOTE — Therapy (Signed)
[place] MAIN [facility] SERVICES [address] [place], [place], [address] Phone: [phone]   Fax:  [phone]  Occupational Therapy Treatment  Patient Details  Name: Luke Durham MRN: [id number] Date of Birth: 1933-08-25 Referring Provider: Marlyn Corporal  Encounter Date: 02/15/2017      OT End of Session - 02/17/17 1037    Visit Number 14   Number of Visits 29   Date for OT Re-Evaluation 03/29/17   Authorization Type Medicare visit 55   OT Start Time 1301   OT Stop Time 1414   OT Time Calculation (min) 73 min   Activity Tolerance Patient tolerated treatment well   Behavior During Therapy [facility] for tasks assessed/performed      Past Medical History:  Diagnosis Date  . Anxiety   . Arthritis   . Asthma    childhood asthma  . Cancer ([place]) 7 years ago   lymphoma, [place] (recent)  . Chronic kidney disease   . Colon polyps   . GERD (gastroesophageal reflux disease)   . History of kidney stones   . Parkinson disease [facility])     Past Surgical History:  Procedure Laterality Date  . COLON SURGERY    . CYSTOSCOPY WITH LITHOLAPAXY N/A 11/14/2016   Procedure: CYSTOSCOPY WITH LITHOLAPAXY;  Surgeon: Hollice Espy, MD;  Location: [facility] ORS;  Service: Urology;  Laterality: N/A;  . EYE SURGERY Bilateral    Cataract Extraction with IOL  . HERNIA REPAIR  20 years  . MOHS SURGERY    . SMALL INTESTINE SURGERY     Per patient 7 years  . TRANSURETHRAL RESECTION OF PROSTATE N/A 11/14/2016   Procedure: TRANSURETHRAL RESECTION OF THE PROSTATE (TURP);  Surgeon: Hollice Espy, MD;  Location: [facility] ORS;  Service: Urology;  Laterality: N/A;    There were no vitals filed for this visit.      Subjective Assessment - 02/17/17 1036    Subjective  Patient reports he feels a little stiff today but no pain.  Reports he is ready to get started again today and looking forwards to the exercises.  Inquiring more about freezing behaviors and ways to help.    Patient is  accompained by: Family member   Pertinent History Patient reports he was diagnosed with Parkinson's disease about 10 plus years ago.  He reports a more recent decline in function in his daily activities in the last 6-12 months.  He has been seeing PT for the last couple months.   Patient Stated Goals Patient reports he wants to be able to do more for himself and around the house.    Currently in Pain? No/denies   Pain Score 0-No pain                      OT Treatments/Exercises (OP) - 02/17/17 1042      Neurological Re-education Exercises   Other Exercises 1 Patient seen for initial instruction of LSVT BIG exercises: LSVT Daily Session Maximal Daily Exercises: Sustained movements are designed to rescale the amplitude of movement output for generalization to daily functional activities. Performed as follows for 1 set of 10 repetitions each: Multi directional sustained movements- 1) Floor to ceiling, 2) Side to side. Multi directional Repetitive movements performed in standing and are designed to provide retraining effort needed for sustained muscle activation in tasks Performed as follows: 3) Step and reach forward, 4) Step and Reach Backwards, 5) Step and reach sideways, 6) Rock and reach forward/backward, 7) Rock and  reach sideways. Patient requires min assist for all exercises, moderate verbal and tactile cues provided. Added posture exercise at the wall with CGA, verbal cues and tactile cues for shoulder retraction and head position for 5 reps with a 10 sec hold.    Other Exercises 2 Manual stretch to patients chest/pectoral area with prolonged stretch from therapist to modify posture to affect balance.  Maximal cues for postural, head position during all tasks. "Stop/go" trials with initiation and termination of gait patterns to affect freezing behaviors which were greatly diminished this date.  Continue to work towards goals to improve performance in daily exercises, posture,  functional transfers, gait and balance to increase independence in daily tasks.                 OT Education - 02/17/17 1036    Education provided Yes   Education Details maximal daily exercises, freezing of gait, posture exercises, turning behaviors    Person(s) Educated Spouse   Methods Explanation;Demonstration;Verbal cues;Tactile cues   Comprehension Verbal cues required;Returned demonstration;Verbalized understanding;Tactile cues required;Need further instruction             OT Long Term Goals - 02/10/17 1501      OT LONG TERM GOAL #1   Title Patient will demonstrate increase right hand grip to be able to cut food with modified independence.    Baseline unable at eval   Time 4   Period Weeks   Status On-going     OT LONG TERM GOAL #2   Title Patient will complete donning long sleeve shirt with modified independence.   Baseline requires assist with long sleeves, can perform short sleeves.   Time 4   Period Weeks   Status On-going     OT LONG TERM GOAL #3   Title Patient will improve coordination to manage buttons on clothing with occasional assistance only.   Baseline requires assist with buttons each time   Time 4   Period Weeks   Status On-going     OT LONG TERM GOAL #4   Title Patient will complete managing pants after toileting with modified independence.    Baseline assist most days    Time 4   Period Weeks   Status On-going     OT LONG TERM GOAL #5   Title Patient will improve hand strength bilaterally to be able to don socks with modified independence.    Baseline unable at evaluation   Time 4   Period Weeks   Status On-going     Long Term Additional Goals   Additional Long Term Goals Yes     OT LONG TERM GOAL #6   Title Patient will improve gait speed and endurance and be able to walk 1050 feet in 6 minutes to negotiate around the home and community safely in 4 weeks   Baseline 920 feet prior to LSVT BIG   Time 4   Period Weeks    Status New     OT LONG TERM GOAL #7   Title Patient will complete HEP for maximal daily exercises with modified independence in 4 weeks   Baseline no current program for Parkinson's exercises   Time 4   Period Weeks   Status New     OT LONG TERM GOAL #8   Title Patient will transfer from sit to stand without the use of arms safely and independently from a variety of chairs/surfaces in 4 weeks.    Baseline difficulty from lower surfaces  Time 4   Period Weeks   Status New     OT LONG TERM GOAL  #9   Baseline Patient will demonstrate decreased episodes of freezing of behaviors from score of 16 to score of less than 12.   Time 4   Period Weeks   Status New               Plan - 02/17/17 1037    Clinical Impression Statement Patient progressing well with maximal daily exercises, he requires min assist for balance during tasks, he tends to perform in a forwards flexed posture which shifts his weight forwards and requires maximal cues and therapist assistance to attempt to correct posture to affect balance.  Therapist providing hands on stretches to pectoral chest area prior to exercises.  Patient reports he has tried some postural exercises at home in the last couple days.  He will require increased focus on body position, turning behaviors and freezing patterns in the next sessions.  Responds well to "stop/go" trials for initiation and termination of gait patterns to reduce incidence of freezing.  One short episode of hesitation/freezing this date.     Rehab Potential Good   Clinical Impairments Affecting Rehab Potential positive: family support, negative: level of motivation, progressive disease   OT Frequency 4x / week   OT Duration 4 weeks   OT Treatment/Interventions Self-care/ADL training;Moist Heat;DME and/or AE instruction;Patient/family education;Therapeutic exercises;Balance training;Therapeutic exercise;Therapeutic activities;Neuromuscular education;Functional Mobility  Training;Manual Therapy   Consulted and Agree with Plan of Care Patient;Family member/caregiver   Family Member Consulted wife      Patient will benefit from skilled therapeutic intervention in order to improve the following deficits and impairments:  Abnormal gait, Decreased coordination, Decreased range of motion, Difficulty walking, Decreased endurance, Decreased safety awareness, Decreased activity tolerance, Decreased balance, Impaired UE functional use, Pain, Decreased mobility, Decreased strength  Visit Diagnosis: Difficulty in walking, not elsewhere classified  Unsteadiness on feet  Muscle weakness (generalized)  Other lack of coordination  Abnormal posture    Problem List Patient Active Problem List   Diagnosis Date Noted  . BPH with urinary obstruction 11/14/2016  . Urinary frequency 10/10/2016  . Microscopic hematuria 10/10/2016  . Parkinson's syndrome ([place]) 06/14/2016  . Arthritis 06/14/2016  . Depression 06/14/2016  . Heartburn 06/14/2016  . Urinary incontinence 06/14/2016  . Skin lesion 06/14/2016  . Dementia 06/14/2016  . Bilateral edema of lower extremity 06/14/2016   Achilles Dunk, OTR/L, CLT  Lovett,Amy 02/17/2017, 10:46 AM  [place] MAIN [facility] SERVICES [address] [place], [place], [address] Phone: [phone]   Fax:  [phone]  Name: Iori Gigante MRN: [id number] Date of Birth: [birth year]

## 2017-02-18 NOTE — Therapy (Signed)
[place] MAIN [facility] SERVICES [address] [place], [place], [address] Phone: [phone]   Fax:  [phone]  Occupational Therapy Treatment  Patient Details  Name: Luke Durham MRN: [id number] Date of Birth: 1933-04-13 Referring Provider: Marlyn Corporal  Encounter Date: 02/17/2017      OT End of Session - 02/17/17 1748    Visit Number 16   Number of Visits 29   Date for OT Re-Evaluation 03/29/17   Authorization Type Medicare visit 81   OT Start Time 1054   OT Stop Time 1200   OT Time Calculation (min) 66 min   Activity Tolerance Patient tolerated treatment well   Behavior During Therapy [facility] for tasks assessed/performed      Past Medical History:  Diagnosis Date  . Anxiety   . Arthritis   . Asthma    childhood asthma  . Cancer ([place]) 7 years ago   lymphoma, [place] (recent)  . Chronic kidney disease   . Colon polyps   . GERD (gastroesophageal reflux disease)   . History of kidney stones   . Parkinson disease [facility])     Past Surgical History:  Procedure Laterality Date  . COLON SURGERY    . CYSTOSCOPY WITH LITHOLAPAXY N/A 11/14/2016   Procedure: CYSTOSCOPY WITH LITHOLAPAXY;  Surgeon: Hollice Espy, MD;  Location: [facility] ORS;  Service: Urology;  Laterality: N/A;  . EYE SURGERY Bilateral    Cataract Extraction with IOL  . HERNIA REPAIR  20 years  . MOHS SURGERY    . SMALL INTESTINE SURGERY     Per patient 7 years  . TRANSURETHRAL RESECTION OF PROSTATE N/A 11/14/2016   Procedure: TRANSURETHRAL RESECTION OF THE PROSTATE (TURP);  Surgeon: Hollice Espy, MD;  Location: [facility] ORS;  Service: Urology;  Laterality: N/A;    There were no vitals filed for this visit.      Subjective Assessment - 02/17/17 1746    Subjective  Patient reports he is feeling a little stiff today but not sore.  States, "Some of these exercises seem harder today."   Patient is accompained by: Family member   Pertinent History Patient reports he was diagnosed  with Parkinson's disease about 10 plus years ago.  He reports a more recent decline in function in his daily activities in the last 6-12 months.  He has been seeing PT for the last couple months.   Patient Stated Goals Patient reports he wants to be able to do more for himself and around the house.    Currently in Pain? No/denies   Pain Score 0-No pain                      OT Treatments/Exercises (OP) - 02/18/17 1102      Neurological Re-education Exercises   Other Exercises 1 Patient seen for instruction of LSVT BIG exercises: LSVT Daily Session Maximal Daily Exercises: Sustained movements are designed to rescale the amplitude of movement output for generalization to daily functional activities. Performed as follows for 1 set of 10 repetitions each: Multi directional sustained movements- 1) Floor to ceiling, 2) Side to side. Multi directional Repetitive movements performed in standing and are designed to provide retraining effort needed for sustained muscle activation in tasks Performed as follows: 3) Step and reach forward, 4) Step and Reach Backwards, 5) Step and reach sideways, 6) Rock and reach forward/backward, 7) Rock and reach sideways. Patient requires min assist for all exercises, moderate verbal and tactile cues provided. Posture exercise at  the wall with CGA, verbal cues and tactile cues for shoulder retraction and head position for 5 reps with a 10 sec hold, therapist modeling provided. Also implemented a visual barrier in which patient had to improve posture to be able to see past. Reciprocal toe tapping at staircase for 10 repetitions each side with CGA assist and verbal cues, therapist demonstration. Patient seen for quarter turns with moderate verbal cues, therapist demo and CGA assist.    Other Exercises 2 Postural stretches at the wall with verbal and tactile cues.  Continued to focus on turning behaviors with moderate cues as well as freezing of gait behaviors.   Functional mobility for 200 feet with cues for step length, turns and intiation and termination of gait patterns.                OT Education - 02/17/17 1747    Education provided Yes   Education Details LSVT BIG maximal daily exercises in adapted version, posture exercises, sit to stand transitions, body position   Person(s) Educated Patient;Spouse   Methods Explanation;Demonstration;Verbal cues   Comprehension Verbal cues required;Returned demonstration;Verbalized understanding             OT Long Term Goals - 02/10/17 1501      OT LONG TERM GOAL #1   Title Patient will demonstrate increase right hand grip to be able to cut food with modified independence.    Baseline unable at eval   Time 4   Period Weeks   Status On-going     OT LONG TERM GOAL #2   Title Patient will complete donning long sleeve shirt with modified independence.   Baseline requires assist with long sleeves, can perform short sleeves.   Time 4   Period Weeks   Status On-going     OT LONG TERM GOAL #3   Title Patient will improve coordination to manage buttons on clothing with occasional assistance only.   Baseline requires assist with buttons each time   Time 4   Period Weeks   Status On-going     OT LONG TERM GOAL #4   Title Patient will complete managing pants after toileting with modified independence.    Baseline assist most days    Time 4   Period Weeks   Status On-going     OT LONG TERM GOAL #5   Title Patient will improve hand strength bilaterally to be able to don socks with modified independence.    Baseline unable at evaluation   Time 4   Period Weeks   Status On-going     Long Term Additional Goals   Additional Long Term Goals Yes     OT LONG TERM GOAL #6   Title Patient will improve gait speed and endurance and be able to walk 1050 feet in 6 minutes to negotiate around the home and community safely in 4 weeks   Baseline 920 feet prior to LSVT BIG   Time 4   Period  Weeks   Status New     OT LONG TERM GOAL #7   Title Patient will complete HEP for maximal daily exercises with modified independence in 4 weeks   Baseline no current program for Parkinson's exercises   Time 4   Period Weeks   Status New     OT LONG TERM GOAL #8   Title Patient will transfer from sit to stand without the use of arms safely and independently from a variety of chairs/surfaces in 4 weeks.  Baseline difficulty from lower surfaces   Time 4   Period Weeks   Status New     OT LONG TERM GOAL  #9   Baseline Patient will demonstrate decreased episodes of freezing of behaviors from score of 16 to score of less than 12.   Time 4   Period Weeks   Status New               Plan - 02/17/17 1748    Clinical Impression Statement Continued with manual stretch to patients chest/pectoral area with prolonged stretch from therapist to modify posture to affect balance. Maximal cues for postural, head position during all tasks. "Stop/go" trials with initiation and termination of gait patterns to affect freezing behaviors which were greatly diminished this date. Continue to work towards goals to improve performance in daily exercises, posture, functional transfers, gait and balance to increase independence in daily tasks.    Rehab Potential Good   Clinical Impairments Affecting Rehab Potential positive: family support, negative: level of motivation, progressive disease   OT Frequency 4x / week   OT Duration 4 weeks   OT Treatment/Interventions Self-care/ADL training;Moist Heat;DME and/or AE instruction;Patient/family education;Therapeutic exercises;Balance training;Therapeutic exercise;Therapeutic activities;Neuromuscular education;Functional Mobility Training;Manual Therapy   Consulted and Agree with Plan of Care Patient;Family member/caregiver      Patient will benefit from skilled therapeutic intervention in order to improve the following deficits and impairments:  Abnormal  gait, Decreased coordination, Decreased range of motion, Difficulty walking, Decreased endurance, Decreased safety awareness, Decreased activity tolerance, Decreased balance, Impaired UE functional use, Pain, Decreased mobility, Decreased strength  Visit Diagnosis: Difficulty in walking, not elsewhere classified  Unsteadiness on feet  Muscle weakness (generalized)  Other lack of coordination  Abnormal posture    Problem List Patient Active Problem List   Diagnosis Date Noted  . BPH with urinary obstruction 11/14/2016  . Urinary frequency 10/10/2016  . Microscopic hematuria 10/10/2016  . Parkinson's syndrome ([place]) 06/14/2016  . Arthritis 06/14/2016  . Depression 06/14/2016  . Heartburn 06/14/2016  . Urinary incontinence 06/14/2016  . Skin lesion 06/14/2016  . Dementia 06/14/2016  . Bilateral edema of lower extremity 06/14/2016   Achilles Dunk, OTR/L, CLT  Lovett,Amy 02/18/2017, 11:05 AM  [place] MAIN [facility] SERVICES [address] [place], [place], [address] Phone: [phone]   Fax:  [phone]  Name: Ryu Cerreta MRN: [id number] Date of Birth: 22-Jun-1933

## 2017-02-20 ENCOUNTER — Encounter: Payer: Self-pay | Admitting: Occupational Therapy

## 2017-02-20 ENCOUNTER — Ambulatory Visit: Payer: Medicare Other | Admitting: Occupational Therapy

## 2017-02-20 DIAGNOSIS — R293 Abnormal posture: Secondary | ICD-10-CM

## 2017-02-20 DIAGNOSIS — M6281 Muscle weakness (generalized): Secondary | ICD-10-CM | POA: Diagnosis not present

## 2017-02-20 DIAGNOSIS — R262 Difficulty in walking, not elsewhere classified: Secondary | ICD-10-CM

## 2017-02-20 DIAGNOSIS — R278 Other lack of coordination: Secondary | ICD-10-CM

## 2017-02-20 DIAGNOSIS — R2681 Unsteadiness on feet: Secondary | ICD-10-CM

## 2017-02-20 NOTE — Therapy (Signed)
[place] MAIN [facility] SERVICES [address] [place], [place], [address] Phone: [phone]   Fax:  [phone]  Occupational Therapy Treatment  Patient Details  Name: Luke Durham MRN: [id number] Date of Birth: 23-Jun-1933 Referring Provider: Marlyn Corporal  Encounter Date: 02/20/2017      OT End of Session - 02/20/17 1634    Visit Number 17   Number of Visits 29   Date for OT Re-Evaluation 03/29/17   Authorization Type Medicare visit 61   OT Start Time 1300   OT Stop Time 1405   OT Time Calculation (min) 65 min   Activity Tolerance Patient tolerated treatment well   Behavior During Therapy [facility] for tasks assessed/performed      Past Medical History:  Diagnosis Date  . Anxiety   . Arthritis   . Asthma    childhood asthma  . Cancer ([place]) 7 years ago   lymphoma, [place] (recent)  . Chronic kidney disease   . Colon polyps   . GERD (gastroesophageal reflux disease)   . History of kidney stones   . Parkinson disease [facility])     Past Surgical History:  Procedure Laterality Date  . COLON SURGERY    . CYSTOSCOPY WITH LITHOLAPAXY N/A 11/14/2016   Procedure: CYSTOSCOPY WITH LITHOLAPAXY;  Surgeon: Hollice Espy, MD;  Location: [facility] ORS;  Service: Urology;  Laterality: N/A;  . EYE SURGERY Bilateral    Cataract Extraction with IOL  . HERNIA REPAIR  20 years  . MOHS SURGERY    . SMALL INTESTINE SURGERY     Per patient 7 years  . TRANSURETHRAL RESECTION OF PROSTATE N/A 11/14/2016   Procedure: TRANSURETHRAL RESECTION OF THE PROSTATE (TURP);  Surgeon: Hollice Espy, MD;  Location: [facility] ORS;  Service: Urology;  Laterality: N/A;    There were no vitals filed for this visit.      Subjective Assessment - 02/20/17 1633    Subjective  Patient reports he didn't feel the exercises went well over the weekend, he felt the first 2 were good but the rest were not.     Pertinent History Patient reports he was diagnosed with Parkinson's disease about 10  plus years ago.  He reports a more recent decline in function in his daily activities in the last 6-12 months.  He has been seeing PT for the last couple months.   Patient Stated Goals Patient reports he wants to be able to do more for himself and around the house.    Currently in Pain? No/denies   Pain Score 0-No pain                      OT Treatments/Exercises (OP) - 02/20/17 2117      ADLs   ADL Comments Patient seen for functional component tasks of sit to stand, turning to change directions, posture, mobility when approaching bench/chair.      Neurological Re-education Exercises   Other Exercises 1 Patient seen for instruction of LSVT BIG exercises: LSVT Daily Session Maximal Daily Exercises: Sustained movements are designed to rescale the amplitude of movement output for generalization to daily functional activities. Performed as follows for 1 set of 10 repetitions each: Multi directional sustained movements- 1) Floor to ceiling, 2) Side to side. Multi directional Repetitive movements performed in standing and are designed to provide retraining effort needed for sustained muscle activation in tasks Performed as follows: 3) Step and reach forward, 4) Step and Reach Backwards, 5) Step and reach sideways,  6) Rock and reach forward/backward, 7) Rock and reach sideways. Patient requires min assist for all exercises in standing, moderate verbal and tactile cues provided. Seated exercises required cues and modeling from therapist for technique. Posture exercise at the wall with SBA, verbal cues and tactile cues for shoulder retraction and head position for 5 reps with a 10 sec hold, therapist modeling provided. Reciprocal toe tapping at staircase for 10 repetitions each side with CGA assist and verbal cues, therapist demonstration. Patient negotiating 5 steps with CGA and cues with patient performing quarter turns with moderate verbal cues, therapist demo and CGA assist at the stair  landing.   Other Exercises 2 Patient seen for functional mobility tasks outdoors with uneven surfaces, winding and curved pathways with use of walker and CGA with cues from therapist for amplitude of steps and technique and positioning for turns.  2 trials of 250 feet.                 OT Education - 02/20/17 9702    Education provided Yes   Education Details maximal daily exercises, posture, freezing of gait.    Methods Explanation;Demonstration;Verbal cues   Comprehension Verbal cues required;Returned demonstration;Verbalized understanding             OT Long Term Goals - 02/10/17 1501      OT LONG TERM GOAL #1   Title Patient will demonstrate increase right hand grip to be able to cut food with modified independence.    Baseline unable at eval   Time 4   Period Weeks   Status On-going     OT LONG TERM GOAL #2   Title Patient will complete donning long sleeve shirt with modified independence.   Baseline requires assist with long sleeves, can perform short sleeves.   Time 4   Period Weeks   Status On-going     OT LONG TERM GOAL #3   Title Patient will improve coordination to manage buttons on clothing with occasional assistance only.   Baseline requires assist with buttons each time   Time 4   Period Weeks   Status On-going     OT LONG TERM GOAL #4   Title Patient will complete managing pants after toileting with modified independence.    Baseline assist most days    Time 4   Period Weeks   Status On-going     OT LONG TERM GOAL #5   Title Patient will improve hand strength bilaterally to be able to don socks with modified independence.    Baseline unable at evaluation   Time 4   Period Weeks   Status On-going     Long Term Additional Goals   Additional Long Term Goals Yes     OT LONG TERM GOAL #6   Title Patient will improve gait speed and endurance and be able to walk 1050 feet in 6 minutes to negotiate around the home and community safely in 4  weeks   Baseline 920 feet prior to LSVT BIG   Time 4   Period Weeks   Status New     OT LONG TERM GOAL #7   Title Patient will complete HEP for maximal daily exercises with modified independence in 4 weeks   Baseline no current program for Parkinson's exercises   Time 4   Period Weeks   Status New     OT LONG TERM GOAL #8   Title Patient will transfer from sit to stand without the use of  arms safely and independently from a variety of chairs/surfaces in 4 weeks.    Baseline difficulty from lower surfaces   Time 4   Period Weeks   Status New     OT LONG TERM GOAL  #9   Baseline Patient will demonstrate decreased episodes of freezing of behaviors from score of 16 to score of less than 12.   Time 4   Period Weeks   Status New               Plan - 02/20/17 1635    Clinical Impression Statement Patient with 3 episodes of hesitation/freezing behaviors today in therapy however minimal in length of time and able to respond to techniques for initiation of gait as well as managing walker. Patient demonstrated improved posture this date and more consistent with head position during exercises however he still needs extensive work on posture and body positioning to reduce risk for falls during functional mobility. Continue to work towards goals to improve balance, functional gait, transfers and participation in daily activities.    Rehab Potential Good   Clinical Impairments Affecting Rehab Potential positive: family support, negative: level of motivation, progressive disease   OT Frequency 4x / week   OT Duration 4 weeks   OT Treatment/Interventions Self-care/ADL training;Moist Heat;DME and/or AE instruction;Patient/family education;Therapeutic exercises;Balance training;Therapeutic exercise;Therapeutic activities;Neuromuscular education;Functional Mobility Training;Manual Therapy   Consulted and Agree with Plan of Care Patient;Family member/caregiver   Family Member Consulted wife       Patient will benefit from skilled therapeutic intervention in order to improve the following deficits and impairments:  Abnormal gait, Decreased coordination, Decreased range of motion, Difficulty walking, Decreased endurance, Decreased safety awareness, Decreased activity tolerance, Decreased balance, Impaired UE functional use, Pain, Decreased mobility, Decreased strength  Visit Diagnosis: Difficulty in walking, not elsewhere classified  Unsteadiness on feet  Muscle weakness (generalized)  Other lack of coordination  Abnormal posture    Problem List Patient Active Problem List   Diagnosis Date Noted  . BPH with urinary obstruction 11/14/2016  . Urinary frequency 10/10/2016  . Microscopic hematuria 10/10/2016  . Parkinson's syndrome ([place]) 06/14/2016  . Arthritis 06/14/2016  . Depression 06/14/2016  . Heartburn 06/14/2016  . Urinary incontinence 06/14/2016  . Skin lesion 06/14/2016  . Dementia 06/14/2016  . Bilateral edema of lower extremity 06/14/2016  Achilles Dunk, OTR/L, CLT  Lovett,Amy 02/20/2017, 9:27 PM  [place] MAIN [facility] SERVICES [address] [place], [place], [address] Phone: [phone]   Fax:  [phone]  Name: Luke Durham MRN: [id number] Date of Birth: Sep 10, 1933

## 2017-02-21 ENCOUNTER — Ambulatory Visit: Payer: Medicare Other | Attending: Neurology | Admitting: Occupational Therapy

## 2017-02-21 DIAGNOSIS — M6281 Muscle weakness (generalized): Secondary | ICD-10-CM | POA: Insufficient documentation

## 2017-02-21 DIAGNOSIS — R2681 Unsteadiness on feet: Secondary | ICD-10-CM | POA: Insufficient documentation

## 2017-02-21 DIAGNOSIS — R293 Abnormal posture: Secondary | ICD-10-CM | POA: Diagnosis present

## 2017-02-21 DIAGNOSIS — R262 Difficulty in walking, not elsewhere classified: Secondary | ICD-10-CM | POA: Insufficient documentation

## 2017-02-21 DIAGNOSIS — R278 Other lack of coordination: Secondary | ICD-10-CM | POA: Diagnosis present

## 2017-02-22 ENCOUNTER — Encounter: Payer: Self-pay | Admitting: Occupational Therapy

## 2017-02-22 ENCOUNTER — Telehealth: Payer: Self-pay

## 2017-02-22 ENCOUNTER — Ambulatory Visit: Payer: Medicare Other | Admitting: Occupational Therapy

## 2017-02-22 DIAGNOSIS — R293 Abnormal posture: Secondary | ICD-10-CM

## 2017-02-22 DIAGNOSIS — R262 Difficulty in walking, not elsewhere classified: Secondary | ICD-10-CM | POA: Diagnosis not present

## 2017-02-22 DIAGNOSIS — R2681 Unsteadiness on feet: Secondary | ICD-10-CM

## 2017-02-22 DIAGNOSIS — R278 Other lack of coordination: Secondary | ICD-10-CM

## 2017-02-22 DIAGNOSIS — M6281 Muscle weakness (generalized): Secondary | ICD-10-CM

## 2017-02-22 LAB — CBC WITH DIFFERENTIAL/PLATELET
Basophils Absolute: 0 10*3/uL (ref 0.0–0.2)
Basos: 0 %
EOS (ABSOLUTE): 0.1 10*3/uL (ref 0.0–0.4)
Eos: 2 %
Hematocrit: 36.2 % — ABNORMAL LOW (ref 37.5–51.0)
Hemoglobin: 12.3 g/dL — ABNORMAL LOW (ref 13.0–17.7)
Immature Grans (Abs): 0 10*3/uL (ref 0.0–0.1)
Immature Granulocytes: 0 %
Lymphocytes Absolute: 0.9 10*3/uL (ref 0.7–3.1)
Lymphs: 25 %
MCH: 29.6 pg (ref 26.6–33.0)
MCHC: 34 g/dL (ref 31.5–35.7)
MCV: 87 fL (ref 79–97)
Monocytes Absolute: 0.4 10*3/uL (ref 0.1–0.9)
Monocytes: 10 %
Neutrophils Absolute: 2.3 10*3/uL (ref 1.4–7.0)
Neutrophils: 63 %
Platelets: 176 10*3/uL (ref 150–379)
RBC: 4.15 x10E6/uL (ref 4.14–5.80)
RDW: 14.2 % (ref 12.3–15.4)
WBC: 3.7 10*3/uL (ref 3.4–10.8)

## 2017-02-22 LAB — COMPREHENSIVE METABOLIC PANEL
ALT: 6 IU/L (ref 0–44)
AST: 20 IU/L (ref 0–40)
Albumin/Globulin Ratio: 2.1 (ref 1.2–2.2)
Albumin: 4.1 g/dL (ref 3.5–4.7)
Alkaline Phosphatase: 49 IU/L (ref 39–117)
BUN/Creatinine Ratio: 27 — ABNORMAL HIGH (ref 10–24)
BUN: 22 mg/dL (ref 8–27)
Bilirubin Total: 0.5 mg/dL (ref 0.0–1.2)
CO2: 25 mmol/L (ref 18–29)
Calcium: 9.1 mg/dL (ref 8.6–10.2)
Chloride: 101 mmol/L (ref 96–106)
Creatinine, Ser: 0.82 mg/dL (ref 0.76–1.27)
GFR calc Af Amer: 96 mL/min/{1.73_m2} (ref >59)
GFR calc non Af Amer: 83 mL/min/{1.73_m2} (ref >59)
Globulin, Total: 2 g/dL (ref 1.5–4.5)
Glucose: 82 mg/dL (ref 65–99)
Potassium: 4.3 mmol/L (ref 3.5–5.2)
Sodium: 139 mmol/L (ref 134–144)
Total Protein: 6.1 g/dL (ref 6.0–8.5)

## 2017-02-22 LAB — LIPID PANEL
Chol/HDL Ratio: 2.9 ratio (ref 0.0–5.0)
Cholesterol, Total: 170 mg/dL (ref 100–199)
HDL: 58 mg/dL (ref >39)
LDL Calculated: 103 mg/dL — ABNORMAL HIGH (ref 0–99)
Triglycerides: 43 mg/dL (ref 0–149)
VLDL Cholesterol Cal: 9 mg/dL (ref 5–40)

## 2017-02-22 NOTE — Telephone Encounter (Signed)
-----   Message from Mar Daring, PA-C sent at 02/22/2017  9:51 AM EDT ----- Labs are stable from previous. Cholesterol WNL. LDL only borderline high at 103. Would like to see below 100. Continue working on healthy dieting habits with limiting fatty foods, carbohydrates, sugars and red meats.

## 2017-02-22 NOTE — Telephone Encounter (Signed)
Pt's wife advised of lab results. Verbally agrees with treatment plan. Renaldo Fiddler, CMA

## 2017-02-22 NOTE — Therapy (Signed)
[place] MAIN [facility] SERVICES [address] [place], [place], [address] Phone: [phone]   Fax:  [phone]  Occupational Therapy Treatment  Patient Details  Name: Luke Durham MRN: [id number] Date of Birth: 04-15-1933 Referring Provider: Marlyn Corporal  Encounter Date: 02/21/2017      OT End of Session - 02/22/17 0952    Visit Number 18   Number of Visits 29   Date for OT Re-Evaluation 03/29/17   Authorization Type Medicare visit 54   OT Start Time 1255   OT Stop Time 1403   OT Time Calculation (min) 68 min   Activity Tolerance Patient tolerated treatment well   Behavior During Therapy [facility] for tasks assessed/performed      Past Medical History:  Diagnosis Date  . Anxiety   . Arthritis   . Asthma    childhood asthma  . Cancer ([place]) 7 years ago   lymphoma, [place] (recent)  . Chronic kidney disease   . Colon polyps   . GERD (gastroesophageal reflux disease)   . History of kidney stones   . Parkinson disease [facility])     Past Surgical History:  Procedure Laterality Date  . COLON SURGERY    . CYSTOSCOPY WITH LITHOLAPAXY N/A 11/14/2016   Procedure: CYSTOSCOPY WITH LITHOLAPAXY;  Surgeon: Hollice Espy, MD;  Location: [facility] ORS;  Service: Urology;  Laterality: N/A;  . EYE SURGERY Bilateral    Cataract Extraction with IOL  . HERNIA REPAIR  20 years  . MOHS SURGERY    . SMALL INTESTINE SURGERY     Per patient 7 years  . TRANSURETHRAL RESECTION OF PROSTATE N/A 11/14/2016   Procedure: TRANSURETHRAL RESECTION OF THE PROSTATE (TURP);  Surgeon: Hollice Espy, MD;  Location: [facility] ORS;  Service: Urology;  Laterality: N/A;    There were no vitals filed for this visit.      Subjective Assessment - 02/22/17 0951    Subjective  Patient reports the exercises are getting better everyday.  He walks in the house and will go outside if his wife is available to walk with him.    Pertinent History Patient reports he was diagnosed with Parkinson's  disease about 10 plus years ago.  He reports a more recent decline in function in his daily activities in the last 6-12 months.  He has been seeing PT for the last couple months.   Patient Stated Goals Patient reports he wants to be able to do more for himself and around the house.    Currently in Pain? No/denies   Pain Score 0-No pain                      OT Treatments/Exercises (OP) - 02/22/17 0954      ADLs   ADL Comments Functional component tasks:  Posture exercise at the wall with SBA, verbal cues and tactile cues for shoulder retraction and head position for 5 reps with a 10 sec hold. Reciprocal toe tapping at staircase for 10 repetitions each side with CGA assist and verbal cues, therapist demonstration. Patient negotiating 5 steps with CGA and cues with patient performing quarter turns with moderate verbal cues, therapist demo and CGA assist at the stair landing for 3 repetitions this date.     Neurological Re-education Exercises   Other Exercises 1 Patient seen for instruction of LSVT BIG exercises: LSVT Daily Session Maximal Daily Exercises: Sustained movements are designed to rescale the amplitude of movement output for generalization to daily functional activities.  Performed as follows for 1 set of 10 repetitions each: Multi directional sustained movements- 1) Floor to ceiling, 2) Side to side. Multi directional Repetitive movements performed in standing and are designed to provide retraining effort needed for sustained muscle activation in tasks Performed as follows: 3) Step and reach forward, 4) Step and Reach Backwards, 5) Step and reach sideways, 6) Rock and reach forward/backward, 7) Rock and reach sideways. Patient requires min assist for all exercises in standing, moderate verbal and tactile cues provided. Seated exercises required continued cues and modeling from therapist for technique.    Other Exercises 2 Patient seen for functional mobility tasks outdoors with  uneven surfaces, winding and curved pathways with use of walker and CGA with cues from therapist for amplitude of steps and technique and positioning for turns. 2 trials of 300 feet.                 OT Education - 02/22/17 [id number]    Education provided Yes   Education Details use of chair for daily exercises at home, posture with all activities.   Person(s) Educated Patient   Methods Explanation;Demonstration;Verbal cues   Comprehension Verbalized understanding;Returned demonstration;Verbal cues required             OT Long Term Goals - 02/22/17 0953      OT LONG TERM GOAL #1   Title Patient will demonstrate increase right hand grip to be able to cut food with modified independence.    Baseline unable at eval   Time 4   Period Weeks   Status On-going     OT LONG TERM GOAL #2   Title Patient will complete donning long sleeve shirt with modified independence.   Baseline requires assist with long sleeves, can perform short sleeves.   Time 4   Period Weeks   Status On-going     OT LONG TERM GOAL #3   Title Patient will improve coordination to manage buttons on clothing with occasional assistance only.   Baseline requires assist with buttons each time   Time 4   Period Weeks   Status On-going     OT LONG TERM GOAL #4   Title Patient will complete managing pants after toileting with modified independence.    Baseline assist most days    Time 4   Period Weeks   Status On-going     OT LONG TERM GOAL #5   Title Patient will improve hand strength bilaterally to be able to don socks with modified independence.    Baseline unable at evaluation   Time 4   Period Weeks   Status On-going     OT LONG TERM GOAL #6   Title Patient will improve gait speed and endurance and be able to walk 1050 feet in 6 minutes to negotiate around the home and community safely in 4 weeks   Baseline 920 feet prior to LSVT BIG   Time 4   Period Weeks   Status New     OT LONG TERM GOAL #7    Title Patient will complete HEP for maximal daily exercises with modified independence in 4 weeks   Baseline no current program for Parkinson's exercises   Time 4   Period Weeks   Status On-going     OT LONG TERM GOAL #8   Title Patient will transfer from sit to stand without the use of arms safely and independently from a variety of chairs/surfaces in 4 weeks.    Baseline difficulty  from lower surfaces   Time 4   Period Weeks   Status On-going     OT LONG TERM GOAL  #9   Baseline Patient will demonstrate decreased episodes of freezing of behaviors from score of 16 to score of less than 12.   Time 4   Period Weeks   Status On-going               Plan - 02/22/17 3403    Clinical Impression Statement Patient with one loss of balance this date during maximal daily exercises stepping forwards, therapist assist for balance recovery.  Patient demonstrates improved posture this date especially at the beginning of session and then starts to fatigue as session progresses.  Patient requires CGA and cues for all exercises and occasional assistance with balance recovery.  Continue to work towards improving performance in daily exercises, amplitude of movement, balance and posture with all daily activities.    Rehab Potential Good   OT Frequency 4x / week   OT Duration 4 weeks   OT Treatment/Interventions Self-care/ADL training;Moist Heat;DME and/or AE instruction;Patient/family education;Therapeutic exercises;Balance training;Therapeutic exercise;Therapeutic activities;Neuromuscular education;Functional Mobility Training;Manual Therapy   Consulted and Agree with Plan of Care Patient;Family member/caregiver   Family Member Consulted wife      Patient will benefit from skilled therapeutic intervention in order to improve the following deficits and impairments:  Abnormal gait, Decreased coordination, Decreased range of motion, Difficulty walking, Decreased endurance, Decreased safety  awareness, Decreased activity tolerance, Decreased balance, Impaired UE functional use, Pain, Decreased mobility, Decreased strength  Visit Diagnosis: Difficulty in walking, not elsewhere classified  Unsteadiness on feet  Muscle weakness (generalized)  Other lack of coordination  Abnormal posture    Problem List Patient Active Problem List   Diagnosis Date Noted  . BPH with urinary obstruction 11/14/2016  . Urinary frequency 10/10/2016  . Microscopic hematuria 10/10/2016  . Parkinson's syndrome ([place]) 06/14/2016  . Arthritis 06/14/2016  . Depression 06/14/2016  . Heartburn 06/14/2016  . Urinary incontinence 06/14/2016  . Skin lesion 06/14/2016  . Dementia 06/14/2016  . Bilateral edema of lower extremity 06/14/2016   Achilles Dunk, OTR/L, CLT  Lovett,Amy 02/22/2017, 9:59 AM  [place] MAIN [facility] SERVICES [address] [place], [place], [address] Phone: [phone]   Fax:  [phone]  Name: Luke Durham MRN: [id number] Date of Birth: 06-Feb-1933

## 2017-02-23 ENCOUNTER — Encounter: Payer: Self-pay | Admitting: Occupational Therapy

## 2017-02-23 ENCOUNTER — Ambulatory Visit: Payer: Medicare Other | Admitting: Occupational Therapy

## 2017-02-23 DIAGNOSIS — M6281 Muscle weakness (generalized): Secondary | ICD-10-CM

## 2017-02-23 DIAGNOSIS — R278 Other lack of coordination: Secondary | ICD-10-CM

## 2017-02-23 DIAGNOSIS — R262 Difficulty in walking, not elsewhere classified: Secondary | ICD-10-CM | POA: Diagnosis not present

## 2017-02-23 DIAGNOSIS — R293 Abnormal posture: Secondary | ICD-10-CM

## 2017-02-23 DIAGNOSIS — R2681 Unsteadiness on feet: Secondary | ICD-10-CM

## 2017-02-23 NOTE — Therapy (Signed)
[place] MAIN [facility] SERVICES [address] [place], [place], [address] Phone: [phone]   Fax:  [phone]  Occupational Therapy Treatment  Patient Details  Name: Luke Durham MRN: [id number] Date of Birth: 05-18-1933 Referring Provider: Marlyn Corporal  Encounter Date: 02/22/2017      OT End of Session - 02/23/17 2000    Visit Number 19   Number of Visits 29   Date for OT Re-Evaluation 03/29/17   Authorization Type Medicare visit 91   OT Start Time 1055   OT Stop Time 1200   OT Time Calculation (min) 65 min   Activity Tolerance Patient tolerated treatment well   Behavior During Therapy [facility] for tasks assessed/performed      Past Medical History:  Diagnosis Date  . Anxiety   . Arthritis   . Asthma    childhood asthma  . Cancer ([place]) 7 years ago   lymphoma, [place] (recent)  . Chronic kidney disease   . Colon polyps   . GERD (gastroesophageal reflux disease)   . History of kidney stones   . Parkinson disease [facility])     Past Surgical History:  Procedure Laterality Date  . COLON SURGERY    . CYSTOSCOPY WITH LITHOLAPAXY N/A 11/14/2016   Procedure: CYSTOSCOPY WITH LITHOLAPAXY;  Surgeon: Hollice Espy, MD;  Location: [facility] ORS;  Service: Urology;  Laterality: N/A;  . EYE SURGERY Bilateral    Cataract Extraction with IOL  . HERNIA REPAIR  20 years  . MOHS SURGERY    . SMALL INTESTINE SURGERY     Per patient 7 years  . TRANSURETHRAL RESECTION OF PROSTATE N/A 11/14/2016   Procedure: TRANSURETHRAL RESECTION OF THE PROSTATE (TURP);  Surgeon: Hollice Espy, MD;  Location: [facility] ORS;  Service: Urology;  Laterality: N/A;    There were no vitals filed for this visit.      Subjective Assessment - 02/23/17 1958    Subjective  Patient reports they are getting a puppy in a couple weeks and hopes it won't impact his walking or increase freezing behaviors.    Pertinent History Patient reports he was diagnosed with Parkinson's disease about 10  plus years ago.  He reports a more recent decline in function in his daily activities in the last 6-12 months.  He has been seeing PT for the last couple months.   Patient Stated Goals Patient reports he wants to be able to do more for himself and around the house.    Currently in Pain? No/denies   Pain Score 0-No pain                      OT Treatments/Exercises (OP) - 02/23/17 2004      ADLs   ADL Comments Functional component tasks: Posture exercise at the wall with SBA, verbal cues and tactile cues for shoulder retraction and head position for 5 reps with a 10 sec hold. Added use of medium ball holding with bilateral UEs and raising overhead with cues for eye gaze upwards to affect head position.  Performed without leaning against wall for 5 additional repetitions.  Reciprocal toe tapping at staircase for 10 repetitions each side with CGA assist and verbal cues, therapist demonstration. Patient negotiating 5 steps with CGA and cues with patient performing quarter turns with moderate verbal cues, therapist demo and CGA assist at the stair landing for 3 repetitions.     Neurological Re-education Exercises   Other Exercises 1 Patient seen for instruction of LSVT  BIG exercises: LSVT Daily Session Maximal Daily Exercises: Sustained movements are designed to rescale the amplitude of movement output for generalization to daily functional activities. Performed as follows for 1 set of 10 repetitions each: Multi directional sustained movements- 1) Floor to ceiling, 2) Side to side. Multi directional Repetitive movements performed in standing and are designed to provide retraining effort needed for sustained muscle activation in tasks Performed as follows: 3) Step and reach forward, 4) Step and Reach Backwards, 5) Step and reach sideways, 6) Rock and reach forward/backward, 7) Rock and reach sideways. Performed with adapted technique this date with use of chair for all exercises in standing.   Patient required CGA assist for all exercises in standing, moderate verbal and tactile cues provided for arm positioning. Seated exercises required continued cues and modeling from therapist for technique.   Other Exercises 2 Patient seen for functional mobility tasks outdoors with uneven surfaces, winding and curved pathways with use of walker and CGA with cues from therapist for amplitude of steps and technique and positioning for turns. 2 trials of 325 feet.                OT Education - 02/23/17 1959    Education provided Yes   Education Details continued focus on adapted exercises with use of chair to perform at home, will ask wife to come in next date to review setup and cues to provide patient with.    Person(s) Educated Patient   Methods Explanation;Demonstration;Verbal cues   Comprehension Verbal cues required;Returned demonstration;Verbalized understanding             OT Long Term Goals - 02/22/17 0953      OT LONG TERM GOAL #1   Title Patient will demonstrate increase right hand grip to be able to cut food with modified independence.    Baseline unable at eval   Time 4   Period Weeks   Status On-going     OT LONG TERM GOAL #2   Title Patient will complete donning long sleeve shirt with modified independence.   Baseline requires assist with long sleeves, can perform short sleeves.   Time 4   Period Weeks   Status On-going     OT LONG TERM GOAL #3   Title Patient will improve coordination to manage buttons on clothing with occasional assistance only.   Baseline requires assist with buttons each time   Time 4   Period Weeks   Status On-going     OT LONG TERM GOAL #4   Title Patient will complete managing pants after toileting with modified independence.    Baseline assist most days    Time 4   Period Weeks   Status On-going     OT LONG TERM GOAL #5   Title Patient will improve hand strength bilaterally to be able to don socks with modified  independence.    Baseline unable at evaluation   Time 4   Period Weeks   Status On-going     OT LONG TERM GOAL #6   Title Patient will improve gait speed and endurance and be able to walk 1050 feet in 6 minutes to negotiate around the home and community safely in 4 weeks   Baseline 920 feet prior to LSVT BIG   Time 4   Period Weeks   Status New     OT LONG TERM GOAL #7   Title Patient will complete HEP for maximal daily exercises with modified independence in 4 weeks  Baseline no current program for Parkinson's exercises   Time 4   Period Weeks   Status On-going     OT LONG TERM GOAL #8   Title Patient will transfer from sit to stand without the use of arms safely and independently from a variety of chairs/surfaces in 4 weeks.    Baseline difficulty from lower surfaces   Time 4   Period Weeks   Status On-going     OT LONG TERM GOAL  #9   Baseline Patient will demonstrate decreased episodes of freezing of behaviors from score of 16 to score of less than 12.   Time 4   Period Weeks   Status On-going               Plan - 02/23/17 2000    Clinical Impression Statement Focused this date on performing maximal daily exercises in adapted version with use of chair for home program.  Patient was getting confused with performance of standard version in the clinic with therapist then performing adapted version at home.   Will continue to focus on adapted version next date with wife present so she can learn positioning of chair and cues that patient responds to.  Patient continues to make good progress with turns, amplitude of steps as well as posture to affect balance.    Rehab Potential Good   Clinical Impairments Affecting Rehab Potential positive: family support, negative: level of motivation, progressive disease   OT Frequency 4x / week   OT Duration 4 weeks   OT Treatment/Interventions Self-care/ADL training;Moist Heat;DME and/or AE instruction;Patient/family  education;Therapeutic exercises;Balance training;Therapeutic exercise;Therapeutic activities;Neuromuscular education;Functional Mobility Training;Manual Therapy   Consulted and Agree with Plan of Care Patient;Family member/caregiver   Family Member Consulted wife      Patient will benefit from skilled therapeutic intervention in order to improve the following deficits and impairments:  Abnormal gait, Decreased coordination, Decreased range of motion, Difficulty walking, Decreased endurance, Decreased safety awareness, Decreased activity tolerance, Decreased balance, Impaired UE functional use, Pain, Decreased mobility, Decreased strength  Visit Diagnosis: Difficulty in walking, not elsewhere classified  Unsteadiness on feet  Muscle weakness (generalized)  Other lack of coordination  Abnormal posture    Problem List Patient Active Problem List   Diagnosis Date Noted  . BPH with urinary obstruction 11/14/2016  . Urinary frequency 10/10/2016  . Microscopic hematuria 10/10/2016  . Parkinson's syndrome ([place]) 06/14/2016  . Arthritis 06/14/2016  . Depression 06/14/2016  . Heartburn 06/14/2016  . Urinary incontinence 06/14/2016  . Skin lesion 06/14/2016  . Dementia 06/14/2016  . Bilateral edema of lower extremity 06/14/2016   Achilles Dunk, OTR/L, CLT  Luke Durham 02/23/2017, 8:17 PM  [place] MAIN [facility] SERVICES [address] [place], [place], [address] Phone: [phone]   Fax:  [phone]  Name: Luke Durham MRN: [id number] Date of Birth: 06-05-1933

## 2017-02-24 NOTE — Therapy (Signed)
[place] MAIN [facility] SERVICES [address] [place], [place], [address] Phone: [phone]   Fax:  [phone]  Occupational Therapy Treatment  Patient Details  Name: Luke Durham MRN: [id number] Date of Birth: 08/28/1933 Referring Provider: Marlyn Corporal  Encounter Date: 02/23/2017      OT End of Session - 02/23/17 2026    Visit Number 20   Number of Visits 29   Date for OT Re-Evaluation 03/29/17   Authorization Type Medicare visit 24   OT Start Time 1300   OT Stop Time 1403   OT Time Calculation (min) 63 min   Activity Tolerance Patient tolerated treatment well   Behavior During Therapy [facility] for tasks assessed/performed      Past Medical History:  Diagnosis Date  . Anxiety   . Arthritis   . Asthma    childhood asthma  . Cancer ([place]) 7 years ago   lymphoma, [place] (recent)  . Chronic kidney disease   . Colon polyps   . GERD (gastroesophageal reflux disease)   . History of kidney stones   . Parkinson disease [facility])     Past Surgical History:  Procedure Laterality Date  . COLON SURGERY    . CYSTOSCOPY WITH LITHOLAPAXY N/A 11/14/2016   Procedure: CYSTOSCOPY WITH LITHOLAPAXY;  Surgeon: Hollice Espy, MD;  Location: [facility] ORS;  Service: Urology;  Laterality: N/A;  . EYE SURGERY Bilateral    Cataract Extraction with IOL  . HERNIA REPAIR  20 years  . MOHS SURGERY    . SMALL INTESTINE SURGERY     Per patient 7 years  . TRANSURETHRAL RESECTION OF PROSTATE N/A 11/14/2016   Procedure: TRANSURETHRAL RESECTION OF THE PROSTATE (TURP);  Surgeon: Hollice Espy, MD;  Location: [facility] ORS;  Service: Urology;  Laterality: N/A;    There were no vitals filed for this visit.      Subjective Assessment - 02/23/17 2025    Pertinent History Patient reports he was diagnosed with Parkinson's disease about 10 plus years ago.  He reports a more recent decline in function in his daily activities in the last 6-12 months.  He has been seeing PT for the last  couple months.   Patient Stated Goals Patient reports he wants to be able to do more for himself and around the house.    Currently in Pain? Yes   Pain Score 2    Pain Location Shoulder   Pain Orientation Right   Pain Descriptors / Indicators Aching   Pain Type Chronic pain                      OT Treatments/Exercises (OP) - 02/24/17 0945      ADLs   ADL Comments Functional component tasks: Posture exercise at the wall with SBA, verbal cues and tactile cues for shoulder retraction and head position for 5 reps with a 10 sec hold. Added use of medium ball holding with bilateral UEs and raising overhead with cues for eye gaze upwards to affect head position.  Performed without leaning against wall for 5 additional repetitions.  Reciprocal toe tapping at staircase for 10 repetitions each side with CGA assist and verbal cues, therapist demonstration. Patient negotiating 5 steps with CGA and cues with patient performing quarter turns with moderate verbal cues, therapist demo and CGA assist at the stair landing for 3 repetitions.     Neurological Re-education Exercises   Other Exercises 1 Patient seen for instruction of LSVT BIG exercises: LSVT Daily  Session Maximal Daily Exercises: Sustained movements are designed to rescale the amplitude of movement output for generalization to daily functional activities. Performed as follows for 1 set of 10 repetitions each: Multi directional sustained movements- 1) Floor to ceiling, 2) Side to side. Multi directional Repetitive movements performed in standing and are designed to provide retraining effort needed for sustained muscle activation in tasks Performed as follows: 3) Step and reach forward, 4) Step and Reach Backwards, 5) Step and reach sideways, 6) Rock and reach forward/backward, 7) Rock and reach sideways. Performed with adapted technique this date with use of chair for all exercises in standing, wife present to observe patient's performance,  therapist cues in order to help guide and direct patient at home for home program.  Patient required CGA assist for all exercises in standing, moderate verbal and tactile cues provided for arm positioning. Seated exercises required continued cues and occasional modeling from therapist for technique.   Other Exercises 2 Patient seen for functional mobility tasks indoors on even, flat surfaces with use of walker and SBA with cues from therapist for amplitude of steps and technique and positioning for turns. 1 trials of 200 feet (less mobility today due to more emphasis on HEP with wife)  5 times sit to stand 19 sec, freezing of gait score of 13.                 OT Education - 02/23/17 2025    Education provided Yes   Education Details maximal daily exercises in adapted version, use of ball for posture exercises   Person(s) Educated Patient;Spouse   Methods Explanation;Demonstration;Verbal cues   Comprehension Verbal cues required;Verbalized understanding;Returned demonstration             OT Long Term Goals - 02/23/17 2027      OT LONG TERM GOAL #1   Title Patient will demonstrate increase right hand grip to be able to cut food with modified independence.    Baseline unable at eval   Time 4   Period Weeks   Status On-going     OT LONG TERM GOAL #2   Title Patient will complete donning long sleeve shirt with modified independence.   Baseline requires assist with long sleeves, can perform short sleeves.   Time 4   Period Weeks   Status On-going     OT LONG TERM GOAL #3   Title Patient will improve coordination to manage buttons on clothing with occasional assistance only.   Baseline requires assist with buttons each time   Time 4   Period Weeks   Status On-going     OT LONG TERM GOAL #4   Title Patient will complete managing pants after toileting with modified independence.    Baseline assist most days    Time 4   Period Weeks   Status On-going     OT LONG TERM  GOAL #5   Title Patient will improve hand strength bilaterally to be able to don socks with modified independence.    Baseline unable at evaluation   Time 4   Period Weeks   Status On-going     OT LONG TERM GOAL #6   Title Patient will improve gait speed and endurance and be able to walk 1050 feet in 6 minutes to negotiate around the home and community safely in 4 weeks   Baseline 920 feet prior to LSVT BIG   Time 4   Period Weeks   Status On-going  OT LONG TERM GOAL #7   Title Patient will complete HEP for maximal daily exercises with modified independence in 4 weeks   Baseline no current program for Parkinson's exercises   Time 4   Period Weeks   Status On-going     OT LONG TERM GOAL #8   Title Patient will transfer from sit to stand without the use of arms safely and independently from a variety of chairs/surfaces in 4 weeks.    Baseline difficulty from lower surfaces   Time 4   Period Weeks   Status On-going     OT LONG TERM GOAL  #9   Baseline Patient will demonstrate decreased episodes of freezing of behaviors from score of 16 to score of less than 12.   Time 4   Period Weeks   Status On-going               Plan - 2017-03-04 01-29-2025    Clinical Impression Statement Patient has made excellent progress in the last 2 weeks with enrollment in LSVT BIG intensive program with therapy 4 days a week.  With his diagnosis of Parkinson's he has responded well to repetition and increased intensity and frequency of treatment.  He has demonstrated improvements in posture and head position during all tasks and demonstrating increased self awareness in this area.  He continues to require assistance with daily exercises and we switched this week to performing adapted version in the clinic and at home since patient was somewhat confused at the difference between the clinic and home performance.  Patient continues to benefit from skilled OT to improve independence in daily tasks and  particularily with a more intensive program to impact his freezing of gait and functional mobility patterns.    Rehab Potential Good   Clinical Impairments Affecting Rehab Potential positive: family support, negative: level of motivation, progressive disease   OT Frequency 4x / week   OT Duration 4 weeks   OT Treatment/Interventions Self-care/ADL training;Moist Heat;DME and/or AE instruction;Patient/family education;Therapeutic exercises;Balance training;Therapeutic exercise;Therapeutic activities;Neuromuscular education;Functional Mobility Training;Manual Therapy   Consulted and Agree with Plan of Care Patient;Family member/caregiver   Family Member Consulted wife      Patient will benefit from skilled therapeutic intervention in order to improve the following deficits and impairments:  Abnormal gait, Decreased coordination, Decreased range of motion, Difficulty walking, Decreased endurance, Decreased safety awareness, Decreased activity tolerance, Decreased balance, Impaired UE functional use, Pain, Decreased mobility, Decreased strength  Visit Diagnosis: Difficulty in walking, not elsewhere classified  Unsteadiness on feet  Muscle weakness (generalized)  Other lack of coordination  Abnormal posture      G-Codes - 03/04/17 01/30/2027    Functional Assessment Tool Used (Outpatient only) clinical judgment, ADL assessment, 5 times sit to stand, freezing of gait questionairre   Functional Limitation Self care   Self Care Current Status [phone]) At least 40 percent but less than 60 percent impaired, limited or restricted   Self Care Goal Status ([id number]) At least 20 percent but less than 40 percent impaired, limited or restricted      Problem List Patient Active Problem List   Diagnosis Date Noted  . BPH with urinary obstruction 11/14/2016  . Urinary frequency 10/10/2016  . Microscopic hematuria 10/10/2016  . Parkinson's syndrome ([place]) 06/14/2016  . Arthritis 06/14/2016  . Depression  06/14/2016  . Heartburn 06/14/2016  . Urinary incontinence 06/14/2016  . Skin lesion 06/14/2016  . Dementia 06/14/2016  . Bilateral edema of lower extremity 06/14/2016   Amy  Oneita Jolly, OTR/L, CLT  [place]ovett,Amy 02/24/2017, 10:05 AM  [place] MAIN [facility] SERVICES [address] [place], [place], [address] Phone: [phone]   Fax:  [phone]  Name: Luke Durham MRN: [id number] Date of Birth: 1933/06/10

## 2017-02-27 ENCOUNTER — Ambulatory Visit: Payer: Medicare Other | Admitting: Occupational Therapy

## 2017-02-27 DIAGNOSIS — R262 Difficulty in walking, not elsewhere classified: Secondary | ICD-10-CM | POA: Diagnosis not present

## 2017-02-27 DIAGNOSIS — R278 Other lack of coordination: Secondary | ICD-10-CM

## 2017-02-27 DIAGNOSIS — R2681 Unsteadiness on feet: Secondary | ICD-10-CM

## 2017-02-27 DIAGNOSIS — M6281 Muscle weakness (generalized): Secondary | ICD-10-CM

## 2017-02-27 DIAGNOSIS — R293 Abnormal posture: Secondary | ICD-10-CM

## 2017-02-28 ENCOUNTER — Encounter: Payer: Medicare Other | Admitting: Occupational Therapy

## 2017-02-28 ENCOUNTER — Ambulatory Visit: Payer: Medicare Other | Admitting: Occupational Therapy

## 2017-02-28 DIAGNOSIS — R262 Difficulty in walking, not elsewhere classified: Secondary | ICD-10-CM

## 2017-02-28 DIAGNOSIS — M6281 Muscle weakness (generalized): Secondary | ICD-10-CM

## 2017-02-28 DIAGNOSIS — R2681 Unsteadiness on feet: Secondary | ICD-10-CM

## 2017-02-28 DIAGNOSIS — R278 Other lack of coordination: Secondary | ICD-10-CM

## 2017-02-28 DIAGNOSIS — R293 Abnormal posture: Secondary | ICD-10-CM

## 2017-03-01 ENCOUNTER — Ambulatory Visit: Payer: Medicare Other | Admitting: Occupational Therapy

## 2017-03-01 DIAGNOSIS — R262 Difficulty in walking, not elsewhere classified: Secondary | ICD-10-CM | POA: Diagnosis not present

## 2017-03-01 DIAGNOSIS — M6281 Muscle weakness (generalized): Secondary | ICD-10-CM

## 2017-03-01 DIAGNOSIS — R2681 Unsteadiness on feet: Secondary | ICD-10-CM

## 2017-03-01 DIAGNOSIS — R293 Abnormal posture: Secondary | ICD-10-CM

## 2017-03-01 DIAGNOSIS — R278 Other lack of coordination: Secondary | ICD-10-CM

## 2017-03-03 ENCOUNTER — Ambulatory Visit: Payer: Medicare Other | Admitting: Occupational Therapy

## 2017-03-03 DIAGNOSIS — R278 Other lack of coordination: Secondary | ICD-10-CM

## 2017-03-03 DIAGNOSIS — R262 Difficulty in walking, not elsewhere classified: Secondary | ICD-10-CM

## 2017-03-03 DIAGNOSIS — R293 Abnormal posture: Secondary | ICD-10-CM

## 2017-03-03 DIAGNOSIS — R2681 Unsteadiness on feet: Secondary | ICD-10-CM

## 2017-03-03 DIAGNOSIS — M6281 Muscle weakness (generalized): Secondary | ICD-10-CM

## 2017-03-05 ENCOUNTER — Encounter: Payer: Self-pay | Admitting: Occupational Therapy

## 2017-03-05 NOTE — Therapy (Signed)
[place] MAIN [facility] SERVICES [address] [place], [place], [address] Phone: [phone]   Fax:  [phone]  Occupational Therapy Treatment  Patient Details  Name: Luke Durham MRN: [id number] Date of Birth: 12-01-1932 Referring Provider: Marlyn Corporal  Encounter Date: 03/01/2017      OT End of Session - 03/05/17 0912    Visit Number 23   Number of Visits 29   Date for OT Re-Evaluation 03/29/17   Authorization Type Medicare visit 7   OT Start Time 1300   OT Stop Time 1405   OT Time Calculation (min) 65 min   Activity Tolerance Patient tolerated treatment well   Behavior During Therapy [facility] for tasks assessed/performed      Past Medical History:  Diagnosis Date  . Anxiety   . Arthritis   . Asthma    childhood asthma  . Cancer ([place]) 7 years ago   lymphoma, [place] (recent)  . Chronic kidney disease   . Colon polyps   . GERD (gastroesophageal reflux disease)   . History of kidney stones   . Parkinson disease [facility])     Past Surgical History:  Procedure Laterality Date  . COLON SURGERY    . CYSTOSCOPY WITH LITHOLAPAXY N/A 11/14/2016   Procedure: CYSTOSCOPY WITH LITHOLAPAXY;  Surgeon: Hollice Espy, MD;  Location: [facility] ORS;  Service: Urology;  Laterality: N/A;  . EYE SURGERY Bilateral    Cataract Extraction with IOL  . HERNIA REPAIR  20 years  . MOHS SURGERY    . SMALL INTESTINE SURGERY     Per patient 7 years  . TRANSURETHRAL RESECTION OF PROSTATE N/A 11/14/2016   Procedure: TRANSURETHRAL RESECTION OF THE PROSTATE (TURP);  Surgeon: Hollice Espy, MD;  Location: [facility] ORS;  Service: Urology;  Laterality: N/A;    There were no vitals filed for this visit.      Subjective Assessment - 03/05/17 0907    Subjective  Patient reports he has been trying to do exercises at home, walking around the house but has not walked too much outdoors.     Pertinent History Patient reports he was diagnosed with Parkinson's disease about 10 plus  years ago.  He reports a more recent decline in function in his daily activities in the last 6-12 months.  He has been seeing PT for the last couple months.   Patient Stated Goals Patient reports he wants to be able to do more for himself and around the house.    Currently in Pain? Yes   Pain Score 2    Pain Location Shoulder   Pain Orientation Right   Pain Descriptors / Indicators Aching   Pain Type Chronic pain   Pain Onset More than a month ago   Pain Frequency Intermittent                      OT Treatments/Exercises (OP) - 03/05/17 0908      ADLs   ADL Comments Functional component tasks: Posture exercise at the wall with SBA, verbal cues and tactile cues for shoulder retraction and head position for 5 reps with a 10 sec hold. Additional 10 repetitions with use of medium ball holding with bilateral UEs and raising overhead with cues for eye gaze upwards to affect head position, performed for 5 additional repetitions without leaning against the wall. Reciprocal toe tapping at staircase for 10 repetitions each side with SBA assist and verbal cues. Patient negotiating 5 steps with CGA and cues with  patient performing quarter turns with minimal verbal cues, therapist demo and SBA assist at the stair landing for 5 repetitions.     Neurological Re-education Exercises   Other Exercises 1 Patient seen for instruction of LSVT BIG exercises: LSVT Daily Session Maximal Daily Exercises: Sustained movements are designed to rescale the amplitude of movement output for generalization to daily functional activities. Performed as follows for 1 set of 10 repetitions each: Multi directional sustained movements- 1) Floor to ceiling, 2) Side to side. Multi directional Repetitive movements performed in standing and are designed to provide retraining effort needed for sustained muscle activation in tasks Performed as follows: 3) Step and reach forward, 4) Step and Reach Backwards, 5) Step and reach  sideways, 6) Rock and reach forward/backward, 7) Rock and reach sideways. Performed with adapted technique with use of chair for all exercises in standing, therapist cues in order to help guide and direct patient at home for home program. Patient required CGA assist for all exercises in standing, moderate verbal and tactile cues provided for arm positioning. Patient seen for focus on navigating in small spaces with walker to address freezing of gait behaviors.  Moderate verbal cues and use of "Go" words when approaching elevator and getting on and off with groups of people present.                  OT Education - 03/05/17 0912    Education provided Yes   Education Details navigating in small spaces   Person(s) Educated Patient;Spouse   Methods Explanation;Demonstration;Verbal cues   Comprehension Verbal cues required;Returned demonstration;Verbalized understanding             OT Long Term Goals - 02/23/17 2027      OT LONG TERM GOAL #1   Title Patient will demonstrate increase right hand grip to be able to cut food with modified independence.    Baseline unable at eval   Time 4   Period Weeks   Status On-going     OT LONG TERM GOAL #2   Title Patient will complete donning long sleeve shirt with modified independence.   Baseline requires assist with long sleeves, can perform short sleeves.   Time 4   Period Weeks   Status On-going     OT LONG TERM GOAL #3   Title Patient will improve coordination to manage buttons on clothing with occasional assistance only.   Baseline requires assist with buttons each time   Time 4   Period Weeks   Status On-going     OT LONG TERM GOAL #4   Title Patient will complete managing pants after toileting with modified independence.    Baseline assist most days    Time 4   Period Weeks   Status On-going     OT LONG TERM GOAL #5   Title Patient will improve hand strength bilaterally to be able to don socks with modified independence.     Baseline unable at evaluation   Time 4   Period Weeks   Status On-going     OT LONG TERM GOAL #6   Title Patient will improve gait speed and endurance and be able to walk 1050 feet in 6 minutes to negotiate around the home and community safely in 4 weeks   Baseline 920 feet prior to LSVT BIG   Time 4   Period Weeks   Status On-going     OT LONG TERM GOAL #7   Title Patient will complete HEP for  maximal daily exercises with modified independence in 4 weeks   Baseline no current program for Parkinson's exercises   Time 4   Period Weeks   Status On-going     OT LONG TERM GOAL #8   Title Patient will transfer from sit to stand without the use of arms safely and independently from a variety of chairs/surfaces in 4 weeks.    Baseline difficulty from lower surfaces   Time 4   Period Weeks   Status On-going     OT LONG TERM GOAL  #9   Baseline Patient will demonstrate decreased episodes of freezing of behaviors from score of 16 to score of less than 12.   Time 4   Period Weeks   Status On-going               Plan - 03/05/17 0913    Clinical Impression Statement Patient had one loss of balance this date when performing stepping to the side, he let go of the chair and started to fall to the left without evidence of attempts at fall recovery.  Required assistance from therapist to recover balance and reports he was not sure what happened.  Patient responded well this date to training for navigating in small spaces.  Tends to have more anxiety with getting onto and out of elevator with larger groups present. Will continue to work on this skill.       Patient will benefit from skilled therapeutic intervention in order to improve the following deficits and impairments:     Visit Diagnosis: Difficulty in walking, not elsewhere classified  Unsteadiness on feet  Muscle weakness (generalized)  Other lack of coordination  Abnormal posture    Problem List Patient Active  Problem List   Diagnosis Date Noted  . BPH with urinary obstruction 11/14/2016  . Urinary frequency 10/10/2016  . Microscopic hematuria 10/10/2016  . Parkinson's syndrome ([place]) 06/14/2016  . Arthritis 06/14/2016  . Depression 06/14/2016  . Heartburn 06/14/2016  . Urinary incontinence 06/14/2016  . Skin lesion 06/14/2016  . Dementia 06/14/2016  . Bilateral edema of lower extremity 06/14/2016   Achilles Dunk, OTR/L, CLT  Lovett,Amy 03/05/2017, 9:17 AM  [place] MAIN [facility] SERVICES [address] [place], [place], [address] Phone: [phone]   Fax:  [phone]  Name: Luke Durham MRN: [id number] Date of Birth: 1933-03-31

## 2017-03-05 NOTE — Therapy (Signed)
[place] MAIN [facility] SERVICES [address] [place], [place], [address] Phone: [phone]   Fax:  [phone]  Occupational Therapy Treatment  Patient Details  Name: Luke Durham MRN: [id number] Date of Birth: 1933/06/10 Referring Provider: Marlyn Corporal  Encounter Date: 02/28/2017      OT End of Session - 03/05/17 0902    Visit Number 22   Number of Visits 29   Date for OT Re-Evaluation 03/29/17   Authorization Type Medicare visit 66   OT Start Time 1330   OT Stop Time 1433   OT Time Calculation (min) 63 min   Activity Tolerance Patient tolerated treatment well   Behavior During Therapy [facility] for tasks assessed/performed      Past Medical History:  Diagnosis Date  . Anxiety   . Arthritis   . Asthma    childhood asthma  . Cancer ([place]) 7 years ago   lymphoma, [place] (recent)  . Chronic kidney disease   . Colon polyps   . GERD (gastroesophageal reflux disease)   . History of kidney stones   . Parkinson disease [facility])     Past Surgical History:  Procedure Laterality Date  . COLON SURGERY    . CYSTOSCOPY WITH LITHOLAPAXY N/A 11/14/2016   Procedure: CYSTOSCOPY WITH LITHOLAPAXY;  Surgeon: Hollice Espy, MD;  Location: [facility] ORS;  Service: Urology;  Laterality: N/A;  . EYE SURGERY Bilateral    Cataract Extraction with IOL  . HERNIA REPAIR  20 years  . MOHS SURGERY    . SMALL INTESTINE SURGERY     Per patient 7 years  . TRANSURETHRAL RESECTION OF PROSTATE N/A 11/14/2016   Procedure: TRANSURETHRAL RESECTION OF THE PROSTATE (TURP);  Surgeon: Hollice Espy, MD;  Location: [facility] ORS;  Service: Urology;  Laterality: N/A;    There were no vitals filed for this visit.      Subjective Assessment - 03/05/17 0858    Subjective  Patient reports they are getting ready at home for a new dog.     Pertinent History Patient reports he was diagnosed with Parkinson's disease about 10 plus years ago.  He reports a more recent decline in function in his  daily activities in the last 6-12 months.  He has been seeing PT for the last couple months.   Patient Stated Goals Patient reports he wants to be able to do more for himself and around the house.    Currently in Pain? No/denies   Pain Score 0-No pain                      OT Treatments/Exercises (OP) - 03/05/17 0859      ADLs   ADL Comments Functional component tasks: Posture exercise at the wall with SBA, verbal cues and tactile cues for shoulder retraction and head position for 5 reps with a 10 sec hold. Additional 10 repetitions with use of medium ball holding with bilateral UEs and raising overhead with cues for eye gaze upwards to affect head position. Reciprocal toe tapping at staircase for 10 repetitions each side with SBA assist and verbal cues. Patient negotiating 5 steps with CGA and cues with patient performing quarter turns with moderate verbal cues, therapist demo and CGA assist at the stair landing for 5 repetitions.     Neurological Re-education Exercises   Other Exercises 1 Patient seen for instruction of LSVT BIG exercises: LSVT Daily Session Maximal Daily Exercises: Sustained movements are designed to rescale the amplitude of movement output  for generalization to daily functional activities. Performed as follows for 1 set of 10 repetitions each: Multi directional sustained movements- 1) Floor to ceiling, 2) Side to side. Multi directional Repetitive movements performed in standing and are designed to provide retraining effort needed for sustained muscle activation in tasks Performed as follows: 3) Step and reach forward, 4) Step and Reach Backwards, 5) Step and reach sideways, 6) Rock and reach forward/backward, 7) Rock and reach sideways. Performed with adapted technique with use of chair for all exercises in standing, therapist cues in order to help guide and direct patient at home for home program. Patient required CGA assist for all exercises in standing, moderate  verbal and tactile cues provided for arm positioning. Patient seen for 6 minute walk test this date, completing 1185 feet with rolling walker and only 2 episodes of freezing of gait with turns lasting only 1-2 secs.  Patient also seen for ambulating short distances, 50-75 feet with hand held assist with cues for step length and reciprocal arm swing.                 OT Education - 03/05/17 0902    Education provided Yes   Education Details hand held assist for short distance ambulation   Person(s) Educated Patient;Spouse   Methods Explanation;Demonstration;Verbal cues   Comprehension Verbal cues required;Returned demonstration;Verbalized understanding             OT Long Term Goals - 02/23/17 2027      OT LONG TERM GOAL #1   Title Patient will demonstrate increase right hand grip to be able to cut food with modified independence.    Baseline unable at eval   Time 4   Period Weeks   Status On-going     OT LONG TERM GOAL #2   Title Patient will complete donning long sleeve shirt with modified independence.   Baseline requires assist with long sleeves, can perform short sleeves.   Time 4   Period Weeks   Status On-going     OT LONG TERM GOAL #3   Title Patient will improve coordination to manage buttons on clothing with occasional assistance only.   Baseline requires assist with buttons each time   Time 4   Period Weeks   Status On-going     OT LONG TERM GOAL #4   Title Patient will complete managing pants after toileting with modified independence.    Baseline assist most days    Time 4   Period Weeks   Status On-going     OT LONG TERM GOAL #5   Title Patient will improve hand strength bilaterally to be able to don socks with modified independence.    Baseline unable at evaluation   Time 4   Period Weeks   Status On-going     OT LONG TERM GOAL #6   Title Patient will improve gait speed and endurance and be able to walk 1050 feet in 6 minutes to negotiate  around the home and community safely in 4 weeks   Baseline 920 feet prior to LSVT BIG   Time 4   Period Weeks   Status On-going     OT LONG TERM GOAL #7   Title Patient will complete HEP for maximal daily exercises with modified independence in 4 weeks   Baseline no current program for Parkinson's exercises   Time 4   Period Weeks   Status On-going     OT LONG TERM GOAL #8   Title  Patient will transfer from sit to stand without the use of arms safely and independently from a variety of chairs/surfaces in 4 weeks.    Baseline difficulty from lower surfaces   Time 4   Period Weeks   Status On-going     OT LONG TERM GOAL  #9   Baseline Patient will demonstrate decreased episodes of freezing of behaviors from score of 16 to score of less than 12.   Time 4   Period Weeks   Status On-going               Plan - 03/05/17 0903    Clinical Impression Statement Patient increased his score on 6 minute walk test by more than 200 feet in 2 weeks making excellent progress.  His posture has continued to improve and showing signs of carry over in the clinic and at home.  Continue to work towards calibration of movement patterns as well as balance tasks to reduce fall risk.     Rehab Potential Good   Clinical Impairments Affecting Rehab Potential positive: family support, negative: level of motivation, progressive disease   OT Frequency 4x / week   OT Duration 4 weeks   OT Treatment/Interventions Self-care/ADL training;Moist Heat;DME and/or AE instruction;Patient/family education;Therapeutic exercises;Balance training;Therapeutic exercise;Therapeutic activities;Neuromuscular education;Functional Mobility Training;Manual Therapy   Consulted and Agree with Plan of Care Patient;Family member/caregiver   Family Member Consulted wife      Patient will benefit from skilled therapeutic intervention in order to improve the following deficits and impairments:  Abnormal gait, Decreased  coordination, Decreased range of motion, Difficulty walking, Decreased endurance, Decreased safety awareness, Decreased activity tolerance, Decreased balance, Impaired UE functional use, Pain, Decreased mobility, Decreased strength  Visit Diagnosis: Difficulty in walking, not elsewhere classified  Unsteadiness on feet  Muscle weakness (generalized)  Other lack of coordination  Abnormal posture    Problem List Patient Active Problem List   Diagnosis Date Noted  . BPH with urinary obstruction 11/14/2016  . Urinary frequency 10/10/2016  . Microscopic hematuria 10/10/2016  . Parkinson's syndrome ([place]) 06/14/2016  . Arthritis 06/14/2016  . Depression 06/14/2016  . Heartburn 06/14/2016  . Urinary incontinence 06/14/2016  . Skin lesion 06/14/2016  . Dementia 06/14/2016  . Bilateral edema of lower extremity 06/14/2016   Achilles Dunk, OTR/L, CLT  Lovett,Amy 03/05/2017, 9:06 AM  [place] MAIN [facility] SERVICES [address] [place], [place], [address] Phone: [phone]   Fax:  [phone]  Name: Jamar Weatherall MRN: [id number] Date of Birth: [birth year]

## 2017-03-05 NOTE — Therapy (Signed)
[place] MAIN [facility] SERVICES [address] [place], [place], [address] Phone: [phone]   Fax:  [phone]  Occupational Therapy Treatment  Patient Details  Name: Luke Durham MRN: [id number] Date of Birth: 1933-09-05 Referring Provider: Marlyn Corporal  Encounter Date: 03/03/2017      OT End of Session - 03/05/17 0921    Visit Number 24   Number of Visits 29   Date for OT Re-Evaluation 03/29/17   Authorization Type Medicare visit 24   OT Start Time 1100   OT Stop Time 1204   OT Time Calculation (min) 64 min   Activity Tolerance Patient tolerated treatment well   Behavior During Therapy [facility] for tasks assessed/performed      Past Medical History:  Diagnosis Date  . Anxiety   . Arthritis   . Asthma    childhood asthma  . Cancer ([place]) 7 years ago   lymphoma, [place] (recent)  . Chronic kidney disease   . Colon polyps   . GERD (gastroesophageal reflux disease)   . History of kidney stones   . Parkinson disease [facility])     Past Surgical History:  Procedure Laterality Date  . COLON SURGERY    . CYSTOSCOPY WITH LITHOLAPAXY N/A 11/14/2016   Procedure: CYSTOSCOPY WITH LITHOLAPAXY;  Surgeon: Hollice Espy, MD;  Location: [facility] ORS;  Service: Urology;  Laterality: N/A;  . EYE SURGERY Bilateral    Cataract Extraction with IOL  . HERNIA REPAIR  20 years  . MOHS SURGERY    . SMALL INTESTINE SURGERY     Per patient 7 years  . TRANSURETHRAL RESECTION OF PROSTATE N/A 11/14/2016   Procedure: TRANSURETHRAL RESECTION OF THE PROSTATE (TURP);  Surgeon: Hollice Espy, MD;  Location: [facility] ORS;  Service: Urology;  Laterality: N/A;    There were no vitals filed for this visit.      Subjective Assessment - 03/05/17 0918    Subjective  Patient reports he and his wife are going to visit with the new puppy this weekend and will plan to bring it home next weekend.    Pertinent History Patient reports he was diagnosed with Parkinson's disease about 10  plus years ago.  He reports a more recent decline in function in his daily activities in the last 6-12 months.  He has been seeing PT for the last couple months.   Patient Stated Goals Patient reports he wants to be able to do more for himself and around the house.    Currently in Pain? No/denies   Pain Score 0-No pain                      OT Treatments/Exercises (OP) - 03/05/17 0919      ADLs   ADL Comments Functional component tasks: Posture exercise at the wall with SBA, verbal cues and tactile cues for shoulder retraction and head position for 5 reps with a 10 sec hold. Additional 10 repetitions with use of medium ball holding with bilateral UEs and raising overhead with cues for eye gaze upwards to affect head position, performed for 5 additional repetitions without leaning against the wall. Reciprocal toe tapping at staircase for 10 repetitions each side with SBA assist and verbal cues. Patient negotiating 5 steps with CGA and cues with patient performing quarter turns with minimal verbal cues, therapist demo and SBA assist at the stair landing for 5 repetitions.     Neurological Re-education Exercises   Other Exercises 1 Patient seen for  instruction of LSVT BIG exercises: LSVT Daily Session Maximal Daily Exercises: Sustained movements are designed to rescale the amplitude of movement output for generalization to daily functional activities. Performed as follows for 1 set of 10 repetitions each: Multi directional sustained movements- 1) Floor to ceiling, 2) Side to side. Multi directional Repetitive movements performed in standing and are designed to provide retraining effort needed for sustained muscle activation in tasks Performed as follows: 3) Step and reach forward, 4) Step and Reach Backwards, 5) Step and reach sideways, 6) Rock and reach forward/backward, 7) Rock and reach sideways. Performed with adapted technique with use of chair for all exercises in standing, therapist  cues in order to help guide and direct patient at home for home program. Patient required CGA assist for all exercises in standing, moderate verbal and tactile cues provided for arm positioning. Patient seen for additional focus on navigating in small spaces with walker to address freezing of gait behaviors.  Continued to require moderate verbal cues and use of "Go" words when approaching elevator and getting on and off with groups of people present.  Minimal evidence of freezing of gait this date with getting on/off the elevator.                 OT Education - 03/05/17 [phone]    Education provided Yes   Education Details continued education on managing in small spaces   Person(s) Educated Patient;Spouse   Methods Explanation;Demonstration;Verbal cues   Comprehension Verbal cues required;Returned demonstration;Verbalized understanding             OT Long Term Goals - 02/23/17 2027      OT LONG TERM GOAL #1   Title Patient will demonstrate increase right hand grip to be able to cut food with modified independence.    Baseline unable at eval   Time 4   Period Weeks   Status On-going     OT LONG TERM GOAL #2   Title Patient will complete donning long sleeve shirt with modified independence.   Baseline requires assist with long sleeves, can perform short sleeves.   Time 4   Period Weeks   Status On-going     OT LONG TERM GOAL #3   Title Patient will improve coordination to manage buttons on clothing with occasional assistance only.   Baseline requires assist with buttons each time   Time 4   Period Weeks   Status On-going     OT LONG TERM GOAL #4   Title Patient will complete managing pants after toileting with modified independence.    Baseline assist most days    Time 4   Period Weeks   Status On-going     OT LONG TERM GOAL #5   Title Patient will improve hand strength bilaterally to be able to don socks with modified independence.    Baseline unable at  evaluation   Time 4   Period Weeks   Status On-going     OT LONG TERM GOAL #6   Title Patient will improve gait speed and endurance and be able to walk 1050 feet in 6 minutes to negotiate around the home and community safely in 4 weeks   Baseline 920 feet prior to LSVT BIG   Time 4   Period Weeks   Status On-going     OT LONG TERM GOAL #7   Title Patient will complete HEP for maximal daily exercises with modified independence in 4 weeks   Baseline no current program  for Parkinson's exercises   Time 4   Period Weeks   Status On-going     OT LONG TERM GOAL #8   Title Patient will transfer from sit to stand without the use of arms safely and independently from a variety of chairs/surfaces in 4 weeks.    Baseline difficulty from lower surfaces   Time 4   Period Weeks   Status On-going     OT LONG TERM GOAL  #9   Baseline Patient will demonstrate decreased episodes of freezing of behaviors from score of 16 to score of less than 12.   Time 4   Period Weeks   Status On-going               Plan - 03/05/17 0459    Clinical Impression Statement Patient progressing with diminishing freezing of gait behaviors this week and focus on navigating in small spaces and crowded places such as the elevator.  Continue to work towards impacting this skill to carryover into other spaces such as the closet and small bathrooms.     Rehab Potential Good   Clinical Impairments Affecting Rehab Potential positive: family support, negative: level of motivation, progressive disease   OT Frequency 4x / week   OT Duration 4 weeks   OT Treatment/Interventions Self-care/ADL training;Moist Heat;DME and/or AE instruction;Patient/family education;Therapeutic exercises;Balance training;Therapeutic exercise;Therapeutic activities;Neuromuscular education;Functional Mobility Training;Manual Therapy   Consulted and Agree with Plan of Care Patient;Family member/caregiver      Patient will benefit from skilled  therapeutic intervention in order to improve the following deficits and impairments:  Abnormal gait, Decreased coordination, Decreased range of motion, Difficulty walking, Decreased endurance, Decreased safety awareness, Decreased activity tolerance, Decreased balance, Impaired UE functional use, Pain, Decreased mobility, Decreased strength  Visit Diagnosis: Difficulty in walking, not elsewhere classified  Unsteadiness on feet  Muscle weakness (generalized)  Other lack of coordination  Abnormal posture    Problem List Patient Active Problem List   Diagnosis Date Noted  . BPH with urinary obstruction 11/14/2016  . Urinary frequency 10/10/2016  . Microscopic hematuria 10/10/2016  . Parkinson's syndrome ([place]) 06/14/2016  . Arthritis 06/14/2016  . Depression 06/14/2016  . Heartburn 06/14/2016  . Urinary incontinence 06/14/2016  . Skin lesion 06/14/2016  . Dementia 06/14/2016  . Bilateral edema of lower extremity 06/14/2016   Achilles Dunk, OTR/L, CLT  Lovett,Amy 03/05/2017, 9:24 AM  [place] MAIN [facility] SERVICES [address] [place], [place], [address] Phone: [phone]   Fax:  [phone]  Name: Luke Durham MRN: [id number] Date of Birth: 12-30-1932

## 2017-03-05 NOTE — Therapy (Signed)
[place] MAIN [facility] SERVICES [address] [place], [place], [address] Phone: [phone]   Fax:  [phone]  Occupational Therapy Treatment  Patient Details  Name: Luke Durham MRN: [id number] Date of Birth: Jul 22, 1933 Referring Provider: Marlyn Corporal  Encounter Date: 02/27/2017      OT End of Session - 03/05/17 0851    Visit Number 21   Number of Visits 29   Date for OT Re-Evaluation 03/29/17   Authorization Type Medicare visit 21   OT Start Time 1259   OT Stop Time 1400   OT Time Calculation (min) 61 min   Activity Tolerance Patient tolerated treatment well   Behavior During Therapy [facility] for tasks assessed/performed      Past Medical History:  Diagnosis Date  . Anxiety   . Arthritis   . Asthma    childhood asthma  . Cancer ([place]) 7 years ago   lymphoma, [place] (recent)  . Chronic kidney disease   . Colon polyps   . GERD (gastroesophageal reflux disease)   . History of kidney stones   . Parkinson disease [facility])     Past Surgical History:  Procedure Laterality Date  . COLON SURGERY    . CYSTOSCOPY WITH LITHOLAPAXY N/A 11/14/2016   Procedure: CYSTOSCOPY WITH LITHOLAPAXY;  Surgeon: Hollice Espy, MD;  Location: [facility] ORS;  Service: Urology;  Laterality: N/A;  . EYE SURGERY Bilateral    Cataract Extraction with IOL  . HERNIA REPAIR  20 years  . MOHS SURGERY    . SMALL INTESTINE SURGERY     Per patient 7 years  . TRANSURETHRAL RESECTION OF PROSTATE N/A 11/14/2016   Procedure: TRANSURETHRAL RESECTION OF THE PROSTATE (TURP);  Surgeon: Hollice Espy, MD;  Location: [facility] ORS;  Service: Urology;  Laterality: N/A;    There were no vitals filed for this visit.      Subjective Assessment - 03/05/17 0844    Subjective  Patient reports he is doing well, has been doing exercises at home, still feels he has difficulty with exercises in standing and trying to figure out arm positions.    Pertinent History Patient reports he was diagnosed  with Parkinson's disease about 10 plus years ago.  He reports a more recent decline in function in his daily activities in the last 6-12 months.  He has been seeing PT for the last couple months.   Patient Stated Goals Patient reports he wants to be able to do more for himself and around the house.    Currently in Pain? No/denies   Pain Score 0-No pain                      OT Treatments/Exercises (OP) - 03/05/17 0845      ADLs   ADL Comments Functional component tasks: Posture exercise at the wall with SBA, verbal cues and tactile cues for shoulder retraction and head position for 5 reps with a 10 sec hold. Additional 10 repetitions with use of medium ball holding with bilateral UEs and raising overhead with cues for eye gaze upwards to affect head position. Reciprocal toe tapping at staircase for 10 repetitions each side with SBA assist and verbal cues. Patient negotiating 5 steps with CGA and cues with patient performing quarter turns with moderate verbal cues, therapist demo and CGA assist at the stair landing for 5 repetitions.     Neurological Re-education Exercises   Other Exercises 1 Patient seen for instruction of LSVT BIG exercises: LSVT Daily  Session Maximal Daily Exercises: Sustained movements are designed to rescale the amplitude of movement output for generalization to daily functional activities. Performed as follows for 1 set of 10 repetitions each: Multi directional sustained movements- 1) Floor to ceiling, 2) Side to side. Multi directional Repetitive movements performed in standing and are designed to provide retraining effort needed for sustained muscle activation in tasks Performed as follows: 3) Step and reach forward, 4) Step and Reach Backwards, 5) Step and reach sideways, 6) Rock and reach forward/backward, 7) Rock and reach sideways. Performed with adapted technique with use of chair for all exercises in standing, therapist cues in order to help guide and direct  patient at home for home program. Patient required CGA assist for all exercises in standing, moderate verbal and tactile cues provided for arm positioning. Seated exercises required continued cues and occasional modeling from therapist for technique.  Patient tends to want to reach down to floor with sit to stand exercise and requires verbal and tactile cues.    Other Exercises 2 Functional mobility indoors with emphasis on turning behaviors and ambulating in more crowded areas, verbal cues to diminish freezing of gait as well as amplitude of steps.                 OT Education - 03/05/17 [phone]    Education provided Yes   Education Details HEP, techniques to diminish freezing of gait   Person(s) Educated Patient;Spouse   Methods Explanation;Demonstration;Verbal cues   Comprehension Verbal cues required;Returned demonstration;Verbalized understanding             OT Long Term Goals - 02/23/17 2027      OT LONG TERM GOAL #1   Title Patient will demonstrate increase right hand grip to be able to cut food with modified independence.    Baseline unable at eval   Time 4   Period Weeks   Status On-going     OT LONG TERM GOAL #2   Title Patient will complete donning long sleeve shirt with modified independence.   Baseline requires assist with long sleeves, can perform short sleeves.   Time 4   Period Weeks   Status On-going     OT LONG TERM GOAL #3   Title Patient will improve coordination to manage buttons on clothing with occasional assistance only.   Baseline requires assist with buttons each time   Time 4   Period Weeks   Status On-going     OT LONG TERM GOAL #4   Title Patient will complete managing pants after toileting with modified independence.    Baseline assist most days    Time 4   Period Weeks   Status On-going     OT LONG TERM GOAL #5   Title Patient will improve hand strength bilaterally to be able to don socks with modified independence.    Baseline  unable at evaluation   Time 4   Period Weeks   Status On-going     OT LONG TERM GOAL #6   Title Patient will improve gait speed and endurance and be able to walk 1050 feet in 6 minutes to negotiate around the home and community safely in 4 weeks   Baseline 920 feet prior to LSVT BIG   Time 4   Period Weeks   Status On-going     OT LONG TERM GOAL #7   Title Patient will complete HEP for maximal daily exercises with modified independence in 4 weeks   Baseline no  current program for Parkinson's exercises   Time 4   Period Weeks   Status On-going     OT LONG TERM GOAL #8   Title Patient will transfer from sit to stand without the use of arms safely and independently from a variety of chairs/surfaces in 4 weeks.    Baseline difficulty from lower surfaces   Time 4   Period Weeks   Status On-going     OT LONG TERM GOAL  #9   Baseline Patient will demonstrate decreased episodes of freezing of behaviors from score of 16 to score of less than 12.   Time 4   Period Weeks   Status On-going               Plan - 03/05/17 3086    Clinical Impression Statement Patient continues to report some difficulty with performance of standing exercises at home with exercises in standing, he gets confused with the difference in stepping forwards and stepping to the side with arm positions.  He responds well to cues and modeling from therapist.  Performing adapted versions both in the clinic and at home to increase consistency and eliminate further confusion with performance. He continues to make good progress with decreased episodes of freezing of gait.     Rehab Potential Good   Clinical Impairments Affecting Rehab Potential positive: family support, negative: level of motivation, progressive disease   OT Frequency 4x / week   OT Duration 4 weeks   OT Treatment/Interventions Self-care/ADL training;Moist Heat;DME and/or AE instruction;Patient/family education;Therapeutic exercises;Balance  training;Therapeutic exercise;Therapeutic activities;Neuromuscular education;Functional Mobility Training;Manual Therapy   Consulted and Agree with Plan of Care Patient;Family member/caregiver   Family Member Consulted wife      Patient will benefit from skilled therapeutic intervention in order to improve the following deficits and impairments:  Abnormal gait, Decreased coordination, Decreased range of motion, Difficulty walking, Decreased endurance, Decreased safety awareness, Decreased activity tolerance, Decreased balance, Impaired UE functional use, Pain, Decreased mobility, Decreased strength  Visit Diagnosis: Difficulty in walking, not elsewhere classified  Unsteadiness on feet  Muscle weakness (generalized)  Other lack of coordination  Abnormal posture    Problem List Patient Active Problem List   Diagnosis Date Noted  . BPH with urinary obstruction 11/14/2016  . Urinary frequency 10/10/2016  . Microscopic hematuria 10/10/2016  . Parkinson's syndrome ([place]) 06/14/2016  . Arthritis 06/14/2016  . Depression 06/14/2016  . Heartburn 06/14/2016  . Urinary incontinence 06/14/2016  . Skin lesion 06/14/2016  . Dementia 06/14/2016  . Bilateral edema of lower extremity 06/14/2016   Achilles Dunk, OTR/L, CLT  Lovett,Amy 03/05/2017, 8:57 AM  [place] MAIN [facility] SERVICES [address] [place], [place], [address] Phone: [phone]   Fax:  [phone]  Name: Luke Durham MRN: [id number] Date of Birth: 1933-04-30

## 2017-03-06 ENCOUNTER — Ambulatory Visit: Payer: Medicare Other | Admitting: Occupational Therapy

## 2017-03-06 DIAGNOSIS — R262 Difficulty in walking, not elsewhere classified: Secondary | ICD-10-CM

## 2017-03-06 DIAGNOSIS — R278 Other lack of coordination: Secondary | ICD-10-CM

## 2017-03-06 DIAGNOSIS — M6281 Muscle weakness (generalized): Secondary | ICD-10-CM

## 2017-03-06 DIAGNOSIS — R2681 Unsteadiness on feet: Secondary | ICD-10-CM

## 2017-03-06 DIAGNOSIS — R293 Abnormal posture: Secondary | ICD-10-CM

## 2017-03-07 ENCOUNTER — Encounter: Payer: Medicare Other | Admitting: Occupational Therapy

## 2017-03-07 ENCOUNTER — Ambulatory Visit: Payer: Medicare Other | Admitting: Occupational Therapy

## 2017-03-07 DIAGNOSIS — R262 Difficulty in walking, not elsewhere classified: Secondary | ICD-10-CM

## 2017-03-07 DIAGNOSIS — R2681 Unsteadiness on feet: Secondary | ICD-10-CM

## 2017-03-07 DIAGNOSIS — M6281 Muscle weakness (generalized): Secondary | ICD-10-CM

## 2017-03-07 DIAGNOSIS — R293 Abnormal posture: Secondary | ICD-10-CM

## 2017-03-07 DIAGNOSIS — R278 Other lack of coordination: Secondary | ICD-10-CM

## 2017-03-08 ENCOUNTER — Ambulatory Visit: Payer: Medicare Other | Admitting: Occupational Therapy

## 2017-03-08 ENCOUNTER — Encounter: Payer: Medicare Other | Admitting: Occupational Therapy

## 2017-03-08 DIAGNOSIS — R293 Abnormal posture: Secondary | ICD-10-CM

## 2017-03-08 DIAGNOSIS — R2681 Unsteadiness on feet: Secondary | ICD-10-CM

## 2017-03-08 DIAGNOSIS — R278 Other lack of coordination: Secondary | ICD-10-CM

## 2017-03-08 DIAGNOSIS — R262 Difficulty in walking, not elsewhere classified: Secondary | ICD-10-CM

## 2017-03-08 DIAGNOSIS — M6281 Muscle weakness (generalized): Secondary | ICD-10-CM

## 2017-03-09 ENCOUNTER — Ambulatory Visit: Payer: Medicare Other | Admitting: Occupational Therapy

## 2017-03-09 ENCOUNTER — Encounter: Payer: Medicare Other | Admitting: Occupational Therapy

## 2017-03-09 DIAGNOSIS — R2681 Unsteadiness on feet: Secondary | ICD-10-CM

## 2017-03-09 DIAGNOSIS — R293 Abnormal posture: Secondary | ICD-10-CM

## 2017-03-09 DIAGNOSIS — M6281 Muscle weakness (generalized): Secondary | ICD-10-CM

## 2017-03-09 DIAGNOSIS — R278 Other lack of coordination: Secondary | ICD-10-CM

## 2017-03-09 DIAGNOSIS — R262 Difficulty in walking, not elsewhere classified: Secondary | ICD-10-CM

## 2017-03-10 ENCOUNTER — Encounter: Payer: Medicare Other | Admitting: Occupational Therapy

## 2017-03-12 ENCOUNTER — Encounter: Payer: Self-pay | Admitting: Occupational Therapy

## 2017-03-12 NOTE — Therapy (Signed)
[place] MAIN [facility] SERVICES [address] [place], [place], [address] Phone: [phone]   Fax:  [phone]  Occupational Therapy Treatment  Patient Details  Name: Luke Durham MRN: [id number] Date of Birth: 03-18-1933 Referring Provider: Marlyn Corporal  Encounter Date: 03/06/2017      OT End of Session - 03/12/17 1654    Visit Number 25   Number of Visits 29   Date for OT Re-Evaluation 03/29/17   Authorization Type Medicare visit 25   OT Start Time 1258   OT Stop Time 1359   OT Time Calculation (min) 61 min   Activity Tolerance Patient tolerated treatment well   Behavior During Therapy [facility] for tasks assessed/performed      Past Medical History:  Diagnosis Date  . Anxiety   . Arthritis   . Asthma    childhood asthma  . Cancer ([place]) 7 years ago   lymphoma, [place] (recent)  . Chronic kidney disease   . Colon polyps   . GERD (gastroesophageal reflux disease)   . History of kidney stones   . Parkinson disease [facility])     Past Surgical History:  Procedure Laterality Date  . COLON SURGERY    . CYSTOSCOPY WITH LITHOLAPAXY N/A 11/14/2016   Procedure: CYSTOSCOPY WITH LITHOLAPAXY;  Surgeon: Hollice Espy, MD;  Location: [facility] ORS;  Service: Urology;  Laterality: N/A;  . EYE SURGERY Bilateral    Cataract Extraction with IOL  . HERNIA REPAIR  20 years  . MOHS SURGERY    . SMALL INTESTINE SURGERY     Per patient 7 years  . TRANSURETHRAL RESECTION OF PROSTATE N/A 11/14/2016   Procedure: TRANSURETHRAL RESECTION OF THE PROSTATE (TURP);  Surgeon: Hollice Espy, MD;  Location: [facility] ORS;  Service: Urology;  Laterality: N/A;    There were no vitals filed for this visit.      Subjective Assessment - 03/12/17 1653    Subjective  Patient reports he had a good weekend, can't believe this is the last week of the intensive portion of his program, pleased with his progress.    Pertinent History Patient reports he was diagnosed with Parkinson's  disease about 10 plus years ago.  He reports a more recent decline in function in his daily activities in the last 6-12 months.  He has been seeing PT for the last couple months.   Patient Stated Goals Patient reports he wants to be able to do more for himself and around the house.    Currently in Pain? No/denies   Pain Score 0-No pain                      OT Treatments/Exercises (OP) - 03/12/17 1656      ADLs   ADL Comments Functional component tasks: Posture exercise at the wall with SBA, verbal cues and tactile cues for shoulder retraction and head position for 5 reps with a 10 sec hold. Additional 10 repetitions with use of medium ball holding with bilateral UEs and raising overhead with cues for eye gaze upwards to affect head position, performed for 5 additional repetitions without leaning against the wall. Reciprocal toe tapping at staircase for 10 repetitions each side with SBA assist and verbal cues. Patient negotiating 5 steps with CGA and cues with patient performing quarter turns with minimal verbal cues, therapist demo and SBA assist at the stair landing for 5 repetitions.     Neurological Re-education Exercises   Other Exercises 1 Patient seen for  instruction of LSVT BIG exercises: LSVT Daily Session Maximal Daily Exercises: Sustained movements are designed to rescale the amplitude of movement output for generalization to daily functional activities. Performed as follows for 1 set of 10 repetitions each: Multi directional sustained movements- 1) Floor to ceiling, 2) Side to side. Multi directional Repetitive movements performed in standing and are designed to provide retraining effort needed for sustained muscle activation in tasks Performed as follows: 3) Step and reach forward, 4) Step and Reach Backwards, 5) Step and reach sideways, 6) Rock and reach forward/backward, 7) Rock and reach sideways. Performed with adapted technique with use of chair for all exercises in  standing, therapist cues in order to help guide and direct patient at home for home program. Patient required CGA assist for all exercises in standing, moderate verbal and tactile cues provided for arm positioning. Patient seen for additional focus on navigating in small spaces with walker to address freezing of gait behaviors. Continued to require moderate verbal cues and use of "Go" words when approaching elevator and getting on and off with groups of people present. Minimal evidence of freezing of gait this date with getting on/off the elevator.    Other Exercises 2 Functional mobility for 2 trials of 300 feet, cues for amplitude of gait with turns. Also seen for navigating in crowded areas without stopping or hesitating.                 OT Education - 03/12/17 1654    Education provided Yes   Education Details turning in smaller spaces   Person(s) Educated Patient;Spouse   Methods Explanation;Demonstration;Verbal cues   Comprehension Verbal cues required;Verbalized understanding;Returned demonstration             OT Long Term Goals - 02/23/17 2027      OT LONG TERM GOAL #1   Title Patient will demonstrate increase right hand grip to be able to cut food with modified independence.    Baseline unable at eval   Time 4   Period Weeks   Status On-going     OT LONG TERM GOAL #2   Title Patient will complete donning long sleeve shirt with modified independence.   Baseline requires assist with long sleeves, can perform short sleeves.   Time 4   Period Weeks   Status On-going     OT LONG TERM GOAL #3   Title Patient will improve coordination to manage buttons on clothing with occasional assistance only.   Baseline requires assist with buttons each time   Time 4   Period Weeks   Status On-going     OT LONG TERM GOAL #4   Title Patient will complete managing pants after toileting with modified independence.    Baseline assist most days    Time 4   Period Weeks   Status  On-going     OT LONG TERM GOAL #5   Title Patient will improve hand strength bilaterally to be able to don socks with modified independence.    Baseline unable at evaluation   Time 4   Period Weeks   Status On-going     OT LONG TERM GOAL #6   Title Patient will improve gait speed and endurance and be able to walk 1050 feet in 6 minutes to negotiate around the home and community safely in 4 weeks   Baseline 920 feet prior to LSVT BIG   Time 4   Period Weeks   Status On-going     OT  LONG TERM GOAL #7   Title Patient will complete HEP for maximal daily exercises with modified independence in 4 weeks   Baseline no current program for Parkinson's exercises   Time 4   Period Weeks   Status On-going     OT LONG TERM GOAL #8   Title Patient will transfer from sit to stand without the use of arms safely and independently from a variety of chairs/surfaces in 4 weeks.    Baseline difficulty from lower surfaces   Time 4   Period Weeks   Status On-going     OT LONG TERM GOAL  #9   Baseline Patient will demonstrate decreased episodes of freezing of behaviors from score of 16 to score of less than 12.   Time 4   Period Weeks   Status On-going               Plan - 03/12/17 1655    Clinical Impression Statement Patient continues to make excellent progress towards goals, he is demonstrating improved amplitude of steps, improved posture with exercises and during functional mobility as well as navigation in crowded areas.  He continues to demonstrate difficulty with turning in smaller spaces such as in the elevator and closet areas. Will continue to work on this skill this week.    Rehab Potential Good   Clinical Impairments Affecting Rehab Potential positive: family support, negative: level of motivation, progressive disease   OT Frequency 4x / week   OT Duration 4 weeks   OT Treatment/Interventions Self-care/ADL training;Moist Heat;DME and/or AE instruction;Patient/family  education;Therapeutic exercises;Balance training;Therapeutic exercise;Therapeutic activities;Neuromuscular education;Functional Mobility Training;Manual Therapy   Consulted and Agree with Plan of Care Patient;Family member/caregiver   Family Member Consulted wife      Patient will benefit from skilled therapeutic intervention in order to improve the following deficits and impairments:  Abnormal gait, Decreased coordination, Decreased range of motion, Difficulty walking, Decreased endurance, Decreased safety awareness, Decreased activity tolerance, Decreased balance, Impaired UE functional use, Pain, Decreased mobility, Decreased strength  Visit Diagnosis: Difficulty in walking, not elsewhere classified  Unsteadiness on feet  Muscle weakness (generalized)  Other lack of coordination  Abnormal posture    Problem List Patient Active Problem List   Diagnosis Date Noted  . BPH with urinary obstruction 11/14/2016  . Urinary frequency 10/10/2016  . Microscopic hematuria 10/10/2016  . Parkinson's syndrome ([place]) 06/14/2016  . Arthritis 06/14/2016  . Depression 06/14/2016  . Heartburn 06/14/2016  . Urinary incontinence 06/14/2016  . Skin lesion 06/14/2016  . Dementia 06/14/2016  . Bilateral edema of lower extremity 06/14/2016   Achilles Dunk, OTR/L, CLT  Lovett,Amy 03/12/2017, 5:00 PM  [place] MAIN [facility] SERVICES [address] [place], [place], [address] Phone: [phone]   Fax:  [phone]  Name: Vahe Pienta MRN: [id number] Date of Birth: 1933-06-22

## 2017-03-12 NOTE — Therapy (Signed)
[place] MAIN [facility] SERVICES [address] [place], [place], [address] Phone: [phone]   Fax:  [phone]  Occupational Therapy Treatment  Patient Details  Name: Luke Durham MRN: [id number] Date of Birth: 04-10-1933 Referring Provider: Marlyn Corporal  Encounter Date: 03/08/2017      OT End of Session - 03/12/17 1713    Visit Number 27   Number of Visits 29   Date for OT Re-Evaluation 03/29/17   Authorization Type Medicare visit 41   OT Start Time 1301   OT Stop Time 1400   OT Time Calculation (min) 59 min   Activity Tolerance Patient tolerated treatment well   Behavior During Therapy [facility] for tasks assessed/performed      Past Medical History:  Diagnosis Date  . Anxiety   . Arthritis   . Asthma    childhood asthma  . Cancer ([place]) 7 years ago   lymphoma, [place] (recent)  . Chronic kidney disease   . Colon polyps   . GERD (gastroesophageal reflux disease)   . History of kidney stones   . Parkinson disease [facility])     Past Surgical History:  Procedure Laterality Date  . COLON SURGERY    . CYSTOSCOPY WITH LITHOLAPAXY N/A 11/14/2016   Procedure: CYSTOSCOPY WITH LITHOLAPAXY;  Surgeon: Hollice Espy, MD;  Location: [facility] ORS;  Service: Urology;  Laterality: N/A;  . EYE SURGERY Bilateral    Cataract Extraction with IOL  . HERNIA REPAIR  20 years  . MOHS SURGERY    . SMALL INTESTINE SURGERY     Per patient 7 years  . TRANSURETHRAL RESECTION OF PROSTATE N/A 11/14/2016   Procedure: TRANSURETHRAL RESECTION OF THE PROSTATE (TURP);  Surgeon: Hollice Espy, MD;  Location: [facility] ORS;  Service: Urology;  Laterality: N/A;    There were no vitals filed for this visit.      Subjective Assessment - 03/12/17 1711    Subjective  Patient reports he feels he has made good progress but has a long ways to go. He would like to work more on turning in small spaces and crowded places. He still feels he requires assist for exercises with modeling.     Pertinent History Patient reports he was diagnosed with Parkinson's disease about 10 plus years ago.  He reports a more recent decline in function in his daily activities in the last 6-12 months.  He has been seeing PT for the last couple months.   Patient Stated Goals Patient reports he wants to be able to do more for himself and around the house.                       OT Treatments/Exercises (OP) - 03/12/17 1714      ADLs   ADL Comments Functional component tasks: Posture exercise at the wall with SBA, verbal cues for shoulder retraction and head position for 5 reps with a 10 sec hold. Additional 10 repetitions with use of medium ball holding with bilateral UEs and raising overhead with cues for eye gaze upwards to affect head position, performed for 10 repetitions without leaning against the wall. Reciprocal toe tapping at staircase for 10 repetitions each side with SBA assist and verbal cues. Patient negotiating 5 steps with SBA and cues with patient performing quarter turns with minimal verbal cues, therapist demo and SBA assist at the stair landing for 5 trials.      Neurological Re-education Exercises   Other Exercises 1 Patient seen for  instruction of LSVT BIG exercises: LSVT Daily Session Maximal Daily Exercises: Sustained movements are designed to rescale the amplitude of movement output for generalization to daily functional activities. Performed as follows for 1 set of 10 repetitions each: Multi directional sustained movements- 1) Floor to ceiling, 2) Side to side. Multi directional Repetitive movements performed in standing and are designed to provide retraining effort needed for sustained muscle activation in tasks Performed as follows: 3) Step and reach forward, 4) Step and Reach Backwards, 5) Step and reach sideways, 6) Rock and reach forward/backward, 7) Rock and reach sideways. Performed with adapted technique with use of chair for all exercises in standing, therapist cues  in order to help guide and direct patient at home for home program. Patient required CGA to SBA assist for all exercises in standing, min verbal and tactile cues provided. Patient seen for additional focus on navigating in small spaces with walker to address freezing of gait behaviors. Continued to require minimal verbal cues and use of "Go" words when approaching elevator and getting on and off with groups of people present.    Other Exercises 2 Functional mobility in hallways due to rain, focus on initiation and termination of gait, turns, navigating in small spaces and crowded places with SBA and cues for techniques to minimize freezing of gait.                 OT Education - 03/12/17 1712    Education provided Yes   Education Details maximal daily exercises-adapted version, turning in small spaces. ambulating in crowded areas.    Person(s) Educated Patient;Spouse   Methods Explanation;Demonstration;Verbal cues   Comprehension Verbal cues required;Returned demonstration;Verbalized understanding             OT Long Term Goals - 02/23/17 2027      OT LONG TERM GOAL #1   Title Patient will demonstrate increase right hand grip to be able to cut food with modified independence.    Baseline unable at eval   Time 4   Period Weeks   Status On-going     OT LONG TERM GOAL #2   Title Patient will complete donning long sleeve shirt with modified independence.   Baseline requires assist with long sleeves, can perform short sleeves.   Time 4   Period Weeks   Status On-going     OT LONG TERM GOAL #3   Title Patient will improve coordination to manage buttons on clothing with occasional assistance only.   Baseline requires assist with buttons each time   Time 4   Period Weeks   Status On-going     OT LONG TERM GOAL #4   Title Patient will complete managing pants after toileting with modified independence.    Baseline assist most days    Time 4   Period Weeks   Status  On-going     OT LONG TERM GOAL #5   Title Patient will improve hand strength bilaterally to be able to don socks with modified independence.    Baseline unable at evaluation   Time 4   Period Weeks   Status On-going     OT LONG TERM GOAL #6   Title Patient will improve gait speed and endurance and be able to walk 1050 feet in 6 minutes to negotiate around the home and community safely in 4 weeks   Baseline 920 feet prior to LSVT BIG   Time 4   Period Weeks   Status On-going  OT LONG TERM GOAL #7   Title Patient will complete HEP for maximal daily exercises with modified independence in 4 weeks   Baseline no current program for Parkinson's exercises   Time 4   Period Weeks   Status On-going     OT LONG TERM GOAL #8   Title Patient will transfer from sit to stand without the use of arms safely and independently from a variety of chairs/surfaces in 4 weeks.    Baseline difficulty from lower surfaces   Time 4   Period Weeks   Status On-going     OT LONG TERM GOAL  #9   Baseline Patient will demonstrate decreased episodes of freezing of behaviors from score of 16 to score of less than 12.   Time 4   Period Weeks   Status On-going               Plan - 03/12/17 1713    Clinical Impression Statement Patient to finish up with intensive portion of LSVT BIG program next date but continues to require assistance on select tasks but showing excellent progress.  Will reassess goals and outcome measures next date and discusss plan of care.    Rehab Potential Good   Clinical Impairments Affecting Rehab Potential positive: family support, negative: level of motivation, progressive disease   OT Frequency 4x / week   OT Duration 4 weeks   OT Treatment/Interventions Self-care/ADL training;Moist Heat;DME and/or AE instruction;Patient/family education;Therapeutic exercises;Balance training;Therapeutic exercise;Therapeutic activities;Neuromuscular education;Functional Mobility  Training;Manual Therapy   Consulted and Agree with Plan of Care Patient;Family member/caregiver   Family Member Consulted wife      Patient will benefit from skilled therapeutic intervention in order to improve the following deficits and impairments:     Visit Diagnosis: Difficulty in walking, not elsewhere classified  Unsteadiness on feet  Muscle weakness (generalized)  Other lack of coordination  Abnormal posture    Problem List Patient Active Problem List   Diagnosis Date Noted  . BPH with urinary obstruction 11/14/2016  . Urinary frequency 10/10/2016  . Microscopic hematuria 10/10/2016  . Parkinson's syndrome ([place]) 06/14/2016  . Arthritis 06/14/2016  . Depression 06/14/2016  . Heartburn 06/14/2016  . Urinary incontinence 06/14/2016  . Skin lesion 06/14/2016  . Dementia 06/14/2016  . Bilateral edema of lower extremity 06/14/2016   Achilles Dunk, OTR/L, CLT  Lovett,Amy 03/12/2017, 5:20 PM  [place] MAIN [facility] SERVICES [address] [place], [place], [address] Phone: [phone]   Fax:  [phone]  Name: Luke Durham MRN: [id number] Date of Birth: 28-May-1933

## 2017-03-12 NOTE — Therapy (Signed)
[place] MAIN [facility] SERVICES [address] [place], [place], [address] Phone: [phone]   Fax:  [phone]  Occupational Therapy Treatment  Patient Details  Name: Luke Durham MRN: [id number] Date of Birth: [birth year] Referring Provider: Marlyn Corporal  Encounter Date: 03/07/2017      OT End of Session - 03/12/17 1703    Visit Number 26   Number of Visits 29   Date for OT Re-Evaluation 03/29/17   Authorization Type Medicare visit 93   OT Start Time 1300   OT Stop Time 1400   OT Time Calculation (min) 60 min   Activity Tolerance Patient tolerated treatment well   Behavior During Therapy [facility] for tasks assessed/performed      Past Medical History:  Diagnosis Date  . Anxiety   . Arthritis   . Asthma    childhood asthma  . Cancer ([place]) 7 years ago   lymphoma, [place] (recent)  . Chronic kidney disease   . Colon polyps   . GERD (gastroesophageal reflux disease)   . History of kidney stones   . Parkinson disease [facility]     Past Surgical History:  Procedure Laterality Date  . COLON SURGERY    . CYSTOSCOPY WITH LITHOLAPAXY N/A 11/14/2016   Procedure: CYSTOSCOPY WITH LITHOLAPAXY;  Surgeon: Hollice Espy, MD;  Location: [facility] ORS;  Service: Urology;  Laterality: N/A;  . EYE SURGERY Bilateral    Cataract Extraction with IOL  . HERNIA REPAIR  20 years  . MOHS SURGERY    . SMALL INTESTINE SURGERY     Per patient 7 years  . TRANSURETHRAL RESECTION OF PROSTATE N/A 11/14/2016   Procedure: TRANSURETHRAL RESECTION OF THE PROSTATE (TURP);  Surgeon: Hollice Espy, MD;  Location: [facility] ORS;  Service: Urology;  Laterality: N/A;    There were no vitals filed for this visit.      Subjective Assessment - 03/12/17 1702    Subjective  Patient reports he would like to work more on getting in and out of the closet, has a 90 degree turn which gives him difficulty.     Pertinent History Patient reports he was diagnosed with Parkinson's disease about 10  plus years ago.  He reports a more recent decline in function in his daily activities in the last 6-12 months.  He has been seeing PT for the last couple months.   Patient Stated Goals Patient reports he wants to be able to do more for himself and around the house.    Currently in Pain? No/denies   Pain Score 0-No pain                      OT Treatments/Exercises (OP) - 03/12/17 1705      ADLs   ADL Comments Functional component tasks: Posture exercise at the wall with SBA, verbal cues and tactile cues for shoulder retraction and head position for 5 reps with a 10 sec hold. Additional 10 repetitions with use of medium ball holding with bilateral UEs and raising overhead with cues for eye gaze upwards to affect head position, performed for 5 additional repetitions without leaning against the wall. Reciprocal toe tapping at staircase for 10 repetitions each side with SBA assist and verbal cues. Patient negotiating 5 steps with CGA and cues with patient performing quarter turns with minimal verbal cues, therapist demo and SBA assist at the stair landing for 5 repetitions.     Neurological Re-education Exercises   Other Exercises 1 Patient seen  for instruction of LSVT BIG exercises: LSVT Daily Session Maximal Daily Exercises: Sustained movements are designed to rescale the amplitude of movement output for generalization to daily functional activities. Performed as follows for 1 set of 10 repetitions each: Multi directional sustained movements- 1) Floor to ceiling, 2) Side to side. Multi directional Repetitive movements performed in standing and are designed to provide retraining effort needed for sustained muscle activation in tasks Performed as follows: 3) Step and reach forward, 4) Step and Reach Backwards, 5) Step and reach sideways, 6) Rock and reach forward/backward, 7) Rock and reach sideways. Performed with adapted technique with use of chair for all exercises in standing, therapist  cues in order to help guide and direct patient at home for home program. Patient required CGA assist for all exercises in standing, moderate verbal and tactile cues provided for arm positioning. Patient seen for additional focus on navigating in small spaces with walker to address freezing of gait behaviors. Continued to require moderate verbal cues and use of "Go" words when approaching elevator and getting on and off with groups of people present. Minimal evidence of freezing of gait this date with getting on/off the elevator.    Other Exercises 2 Patient seen for additional posture and reach exercises with use of small ball in bilateral UE, performed in standing without leaning on the wall.  Cues for technique and eye gaze to promote more upright posture.  Functional mobility for 400 feet with no rest break with cues for navigating around people, changes in flooring and over metal grates.                 OT Education - 03/12/17 1703    Education provided Yes   Education Details HEP, turns, posture   Person(s) Educated Patient;Spouse   Methods Explanation;Demonstration;Verbal cues   Comprehension Verbal cues required;Returned demonstration;Verbalized understanding             OT Long Term Goals - 02/23/17 2027      OT LONG TERM GOAL #1   Title Patient will demonstrate increase right hand grip to be able to cut food with modified independence.    Baseline unable at eval   Time 4   Period Weeks   Status On-going     OT LONG TERM GOAL #2   Title Patient will complete donning long sleeve shirt with modified independence.   Baseline requires assist with long sleeves, can perform short sleeves.   Time 4   Period Weeks   Status On-going     OT LONG TERM GOAL #3   Title Patient will improve coordination to manage buttons on clothing with occasional assistance only.   Baseline requires assist with buttons each time   Time 4   Period Weeks   Status On-going     OT LONG TERM  GOAL #4   Title Patient will complete managing pants after toileting with modified independence.    Baseline assist most days    Time 4   Period Weeks   Status On-going     OT LONG TERM GOAL #5   Title Patient will improve hand strength bilaterally to be able to don socks with modified independence.    Baseline unable at evaluation   Time 4   Period Weeks   Status On-going     OT LONG TERM GOAL #6   Title Patient will improve gait speed and endurance and be able to walk 1050 feet in 6 minutes to negotiate around the  home and community safely in 4 weeks   Baseline 920 feet prior to LSVT BIG   Time 4   Period Weeks   Status On-going     OT LONG TERM GOAL #7   Title Patient will complete HEP for maximal daily exercises with modified independence in 4 weeks   Baseline no current program for Parkinson's exercises   Time 4   Period Weeks   Status On-going     OT LONG TERM GOAL #8   Title Patient will transfer from sit to stand without the use of arms safely and independently from a variety of chairs/surfaces in 4 weeks.    Baseline difficulty from lower surfaces   Time 4   Period Weeks   Status On-going     OT LONG TERM GOAL  #9   Baseline Patient will demonstrate decreased episodes of freezing of behaviors from score of 16 to score of less than 12.   Time 4   Period Weeks   Status On-going               Plan - 03/12/17 1704    Clinical Impression Statement Patient demonstrating significant reduction of freezing and hesitation behaviors this week especially when ambulating over changes in surfaces such as over metal grates, carpet versus tile and over thresholds and doorways.  Patient continues to work towards turning in smaller spaces using quarter turn technique with cues.    Rehab Potential Good   Clinical Impairments Affecting Rehab Potential positive: family support, negative: level of motivation, progressive disease   OT Frequency 4x / week   OT Duration 4 weeks    OT Treatment/Interventions Self-care/ADL training;Moist Heat;DME and/or AE instruction;Patient/family education;Therapeutic exercises;Balance training;Therapeutic exercise;Therapeutic activities;Neuromuscular education;Functional Mobility Training;Manual Therapy   Consulted and Agree with Plan of Care Patient;Family member/caregiver   Family Member Consulted wife      Patient will benefit from skilled therapeutic intervention in order to improve the following deficits and impairments:  Abnormal gait, Decreased coordination, Decreased range of motion, Difficulty walking, Decreased endurance, Decreased safety awareness, Decreased activity tolerance, Decreased balance, Impaired UE functional use, Pain, Decreased mobility, Decreased strength  Visit Diagnosis: Difficulty in walking, not elsewhere classified  Unsteadiness on feet  Muscle weakness (generalized)  Other lack of coordination  Abnormal posture    Problem List Patient Active Problem List   Diagnosis Date Noted  . BPH with urinary obstruction 11/14/2016  . Urinary frequency 10/10/2016  . Microscopic hematuria 10/10/2016  . Parkinson's syndrome ([place]) 06/14/2016  . Arthritis 06/14/2016  . Depression 06/14/2016  . Heartburn 06/14/2016  . Urinary incontinence 06/14/2016  . Skin lesion 06/14/2016  . Dementia 06/14/2016  . Bilateral edema of lower extremity 06/14/2016   Achilles Dunk, OTR/L, CLT  Lovett,Amy 03/12/2017, 5:09 PM  [place] MAIN [facility] SERVICES [address] [place], [place], [address] Phone: [phone]   Fax:  [phone]  Name: Luke Durham MRN: [id number] Date of Birth: 12/08/1932

## 2017-03-14 ENCOUNTER — Encounter: Payer: Medicare Other | Admitting: Occupational Therapy

## 2017-03-14 NOTE — Therapy (Signed)
[place] MAIN [facility] SERVICES [address] [place], [place], [address] Phone: [phone]   Fax:  [phone]  Occupational Therapy Treatment/Reassessment  Patient Details  Name: Luke Durham MRN: [id number] Date of Birth: 03/22/1933 Referring Provider: Marlyn Corporal  Encounter Date: 03/09/2017      OT End of Session - 03/14/17 1905    Visit Number 28   Number of Visits 29   Date for OT Re-Evaluation 03/29/17   Authorization Type Medicare visit 55   OT Start Time 1300   OT Stop Time 1405   OT Time Calculation (min) 65 min   Activity Tolerance Patient tolerated treatment well   Behavior During Therapy [facility] for tasks assessed/performed      Past Medical History:  Diagnosis Date  . Anxiety   . Arthritis   . Asthma    childhood asthma  . Cancer ([place]) 7 years ago   lymphoma, [place] (recent)  . Chronic kidney disease   . Colon polyps   . GERD (gastroesophageal reflux disease)   . History of kidney stones   . Parkinson disease [facility])     Past Surgical History:  Procedure Laterality Date  . COLON SURGERY    . CYSTOSCOPY WITH LITHOLAPAXY N/A 11/14/2016   Procedure: CYSTOSCOPY WITH LITHOLAPAXY;  Surgeon: Hollice Espy, MD;  Location: [facility] ORS;  Service: Urology;  Laterality: N/A;  . EYE SURGERY Bilateral    Cataract Extraction with IOL  . HERNIA REPAIR  20 years  . MOHS SURGERY    . SMALL INTESTINE SURGERY     Per patient 7 years  . TRANSURETHRAL RESECTION OF PROSTATE N/A 11/14/2016   Procedure: TRANSURETHRAL RESECTION OF THE PROSTATE (TURP);  Surgeon: Hollice Espy, MD;  Location: [facility] ORS;  Service: Urology;  Laterality: N/A;    There were no vitals filed for this visit.      Subjective Assessment - 03/14/17 1902    Subjective  Patient and wife pleased with progress, he is happy he participated in intense therapy sessions but feels he is still progressing with exercises and with LSVT principles.  Would like to continue to work on  improving performance in daily exercises, turning in small spaces and freezing of gait.    Pertinent History Patient reports he was diagnosed with Parkinson's disease about 10 plus years ago.  He reports a more recent decline in function in his daily activities in the last 6-12 months.  He has been seeing PT for the last couple months.   Patient Stated Goals Patient reports he wants to be able to do more for himself and around the house.    Currently in Pain? No/denies   Pain Score 0-No pain                      OT Treatments/Exercises (OP) - 03/14/17 1911      ADLs   ADL Comments Functional component tasks: Posture exercise at the wall with SBA, verbal cues for shoulder retraction and head position for 5 reps with a 10 sec hold. Additional 10 repetitions with use of medium ball holding with bilateral UEs and raising overhead with cues for eye gaze upwards to affect head position, performed for 10 repetitions without leaning against the wall. Reciprocal toe tapping at staircase for 10 repetitions each side with SBA assist and verbal cues. Patient negotiating 5 steps with SBA and cues with patient performing quarter turns with minimal verbal cues, therapist demo and SBA assist at the stair  landing      Neurological Re-education Exercises   Other Exercises 1 Patient seen for instruction of LSVT BIG exercises: LSVT Daily Session Maximal Daily Exercises: Sustained movements are designed to rescale the amplitude of movement output for generalization to daily functional activities. Performed as follows for 1 set of 10 repetitions each: Multi directional sustained movements- 1) Floor to ceiling, 2) Side to side. Multi directional Repetitive movements performed in standing and are designed to provide retraining effort needed for sustained muscle activation in tasks Performed as follows: 3) Step and reach forward, 4) Step and Reach Backwards, 5) Step and reach sideways, 6) Rock and reach  forward/backward, 7) Rock and reach sideways. Performed with adapted technique with use of chair for all exercises in standing, therapist cues in order to help guide and direct patient at home for home program. Patient required CGA to SBA assist for all exercises in standing, min verbal and tactile cues provided. Patient seen for additional focus on navigating in small spaces with walker to address freezing of gait behaviors. Continued to require minimal verbal cues and use of "Go" words when approaching elevator and getting on and off with groups of people present.    Other Exercises 2 6 minute walk test 1195 feet, 5 times sit to stand 13 secs, freezing of gait questionairre  11                OT Education - 03/14/17 1904    Education provided Yes   Education Details HEP, maximal daily exercises, BIG principles, turning and freezing behaviors, goals and POC   Person(s) Educated Patient;Spouse   Methods Explanation;Demonstration;Verbal cues   Comprehension Verbal cues required;Returned demonstration;Verbalized understanding             OT Long Term Goals - 03/14/17 1905      OT LONG TERM GOAL #1   Title Patient will demonstrate increase right hand grip to be able to cut food with modified independence.    Baseline --   Time 12   Period Weeks   Status Partially Met     OT LONG TERM GOAL #2   Title Patient will complete donning long sleeve shirt with modified independence.   Baseline requires assist with long sleeves, can perform short sleeves.   Time 12   Period Weeks   Status Partially Met     OT LONG TERM GOAL #3   Title Patient will improve coordination to manage buttons on clothing with occasional assistance only.   Baseline requires assist with buttons each time   Time 12   Period Weeks   Status Partially Met     OT LONG TERM GOAL #4   Title Patient will complete managing pants after toileting with modified independence.    Baseline assist most days    Time 12    Period Weeks   Status On-going     OT LONG TERM GOAL #5   Title Patient will improve hand strength bilaterally to be able to don socks with modified independence.    Baseline unable at evaluation   Time 4   Period Weeks   Status Achieved     OT LONG TERM GOAL #6   Title Patient will improve gait speed and endurance and be able to walk 1050 feet in 6 minutes to negotiate around the home and community safely in 4 weeks   Baseline 920 feet prior to LSVT BIG, 1195 feet at 16th visit   Time 4   Period  Weeks   Status Achieved     OT LONG TERM GOAL #7   Title Patient will complete HEP for maximal daily exercises with modified independence in 4 weeks   Time 12   Period Weeks   Status Partially Met     OT LONG TERM GOAL #8   Title Patient will transfer from sit to stand without the use of arms safely and independently from a variety of chairs/surfaces in 4 weeks.    Baseline difficulty from lower surfaces   Time 4   Period Weeks   Status Achieved     OT LONG TERM GOAL  #9   Baseline Patient will demonstrate decreased episodes of freezing of behaviors from score of 16 to score of less than 12.   Time 4   Period Weeks   Status Achieved               Plan - 03/14/17 1905    Clinical Impression Statement Reassessment of patient this date after completion of LSVT BIG intensive program.  He has made excellent progress as demonstrated with visably improved posture during all activities, he met and exceeded goal for 6 minute walk test  1195 feet, improved with sit to stand to 13 secs, decreased freezing of gait frequency and duration as well as improved amplitude of steps, participation in self care tasks and improved functional balance with no reported falls.  He would benefit from continued skilled OT to work towards improving performance in home program, continued reduction with freezing of gait episodes and turning in small spaces such as the bathroom and closet.  Although patient  would benefit from continued therapy in an intensive format, he is agreeable to 2 times a week and is motivated to continue for more progress.     Rehab Potential Good   Clinical Impairments Affecting Rehab Potential positive: family support, negative: level of motivation, progressive disease   OT Frequency 2x / week   OT Duration 12 weeks   OT Treatment/Interventions Self-care/ADL training;Moist Heat;DME and/or AE instruction;Patient/family education;Therapeutic exercises;Balance training;Therapeutic exercise;Therapeutic activities;Neuromuscular education;Functional Mobility Training;Manual Therapy   Consulted and Agree with Plan of Care Patient;Family member/caregiver   Family Member Consulted wife      Patient will benefit from skilled therapeutic intervention in order to improve the following deficits and impairments:  Abnormal gait, Decreased coordination, Decreased range of motion, Difficulty walking, Decreased endurance, Decreased safety awareness, Decreased activity tolerance, Decreased balance, Impaired UE functional use, Pain, Decreased mobility, Decreased strength  Visit Diagnosis: Difficulty in walking, not elsewhere classified - Plan: Ot plan of care cert/re-cert  Unsteadiness on feet - Plan: Ot plan of care cert/re-cert  Muscle weakness (generalized) - Plan: Ot plan of care cert/re-cert  Other lack of coordination - Plan: Ot plan of care cert/re-cert  Abnormal posture - Plan: Ot plan of care cert/re-cert    Problem List Patient Active Problem List   Diagnosis Date Noted  . BPH with urinary obstruction 11/14/2016  . Urinary frequency 10/10/2016  . Microscopic hematuria 10/10/2016  . Parkinson's syndrome ([place]) 06/14/2016  . Arthritis 06/14/2016  . Depression 06/14/2016  . Heartburn 06/14/2016  . Urinary incontinence 06/14/2016  . Skin lesion 06/14/2016  . Dementia 06/14/2016  . Bilateral edema of lower extremity 06/14/2016   Achilles Dunk, OTR/L,  CLT  Lovett,Amy 03/14/2017, 7:24 PM  [place] MAIN [facility] SERVICES [address] [place], [place], [address] Phone: [phone]   Fax:  [phone]  Name: Luke Durham  MRN: [id number] Date of Birth: May 26, 1933

## 2017-03-15 ENCOUNTER — Encounter: Payer: Medicare Other | Admitting: Occupational Therapy

## 2017-03-16 ENCOUNTER — Encounter: Payer: Medicare Other | Admitting: Occupational Therapy

## 2017-03-17 ENCOUNTER — Encounter: Payer: Medicare Other | Admitting: Occupational Therapy

## 2017-03-23 ENCOUNTER — Ambulatory Visit: Payer: Medicare Other | Admitting: Occupational Therapy

## 2017-03-23 ENCOUNTER — Encounter: Payer: Self-pay | Admitting: Occupational Therapy

## 2017-03-23 ENCOUNTER — Encounter: Payer: Medicare Other | Admitting: Occupational Therapy

## 2017-03-23 DIAGNOSIS — R262 Difficulty in walking, not elsewhere classified: Secondary | ICD-10-CM | POA: Diagnosis not present

## 2017-03-23 DIAGNOSIS — R278 Other lack of coordination: Secondary | ICD-10-CM

## 2017-03-23 DIAGNOSIS — R2681 Unsteadiness on feet: Secondary | ICD-10-CM

## 2017-03-23 DIAGNOSIS — M6281 Muscle weakness (generalized): Secondary | ICD-10-CM

## 2017-03-23 DIAGNOSIS — R293 Abnormal posture: Secondary | ICD-10-CM

## 2017-03-23 NOTE — Therapy (Signed)
[place] MAIN [facility] SERVICES [address] [place], [place], [address] Phone: [phone]   Fax:  [phone]  Occupational Therapy Treatment  Patient Details  Name: Luke Durham MRN: [id number] Date of Birth: 07-Sep-1933 Referring Provider: Marlyn Corporal  Encounter Date: 03/23/2017      OT End of Session - 03/23/17 1615    Visit Number 29   Number of Visits 8   Date for OT Re-Evaluation 06/01/17   Authorization Type Medicare visit 41   OT Start Time 1055   OT Stop Time 1158   OT Time Calculation (min) 63 min   Activity Tolerance Patient tolerated treatment well   Behavior During Therapy [facility] for tasks assessed/performed      Past Medical History:  Diagnosis Date  . Anxiety   . Arthritis   . Asthma    childhood asthma  . Cancer ([place]) 7 years ago   lymphoma, [place] (recent)  . Chronic kidney disease   . Colon polyps   . GERD (gastroesophageal reflux disease)   . History of kidney stones   . Parkinson disease [facility])     Past Surgical History:  Procedure Laterality Date  . COLON SURGERY    . CYSTOSCOPY WITH LITHOLAPAXY N/A 11/14/2016   Procedure: CYSTOSCOPY WITH LITHOLAPAXY;  Surgeon: Hollice Espy, MD;  Location: [facility] ORS;  Service: Urology;  Laterality: N/A;  . EYE SURGERY Bilateral    Cataract Extraction with IOL  . HERNIA REPAIR  20 years  . MOHS SURGERY    . SMALL INTESTINE SURGERY     Per patient 7 years  . TRANSURETHRAL RESECTION OF PROSTATE N/A 11/14/2016   Procedure: TRANSURETHRAL RESECTION OF THE PROSTATE (TURP);  Surgeon: Hollice Espy, MD;  Location: [facility] ORS;  Service: Urology;  Laterality: N/A;    There were no vitals filed for this visit.      Subjective Assessment - 03/23/17 1611    Subjective  Patient reports he is not sure what happened but he feels like he has regressed since our last intensive session about 1.5 weeks ago.  He reports he is having some pain in his legs at times and feels they are heavy and  freezing behaviors have increased over the last 7 days.  He reports he has done some of his exercises.  Recommend patient track his symptoms over the next couple weeks so he can report to his MD any trends since it sounds like maybe the pain and freezing is coming on towards the last hour before the next medication times.    Pertinent History Patient reports he was diagnosed with Parkinson's disease about 10 plus years ago.  He reports a more recent decline in function in his daily activities in the last 6-12 months.  He has been seeing PT for the last couple months.   Patient Stated Goals Patient reports he wants to be able to do more for himself and around the house.    Currently in Pain? Yes   Pain Score 3    Pain Location Leg   Pain Orientation Right;Left   Pain Descriptors / Indicators Aching;Tiring;Heaviness   Pain Type Acute pain   Pain Onset In the past 7 days   Pain Frequency Intermittent   Multiple Pain Sites No                      OT Treatments/Exercises (OP) - 03/23/17 1623      ADLs   ADL Comments Patient demonstrating decreased  posture this date during all tasks and requiring max cues to work towards correction.  Patient seen for managing turns for entering closet, bathroom areas in small spaces with moderate cues for turning and strategies to diminish freezing of gait.        Neurological Re-education Exercises   Other Exercises 1 Patient seen for instruction of LSVT BIG exercises: LSVT Daily Session Maximal Daily Exercises: Sustained movements are designed to rescale the amplitude of movement output for generalization to daily functional activities. Performed as follows for 1 set of 10 repetitions each: Multi directional sustained movements- 1) Floor to ceiling, 2) Side to side. Multi directional Repetitive movements performed in standing and are designed to provide retraining effort needed for sustained muscle activation in tasks Performed as follows: 3) Step and  reach forward, 4) Step and Reach Backwards, 5) Step and reach sideways, 6) Rock and reach forward/backward, 7) Rock and reach sideways. Performed with adapted technique with use of chair for all exercises in standing, therapist cues in order to help guide and direct patient at home for home program. Patient required CGA  assist for all exercises in standing, moderate to max verbal and tactile cues provided. Patient seen for additional focus on navigating in small spaces with walker to address freezing of gait behaviors. Reciprocal toe tapping at staircase for 10 repetitions each side with SBA assist and verbal cues. Patient negotiating 5 steps with CGA and cues with patient performing quarter turns with moderate verbal cues, therapist demo and CGA at the stair landing.  Functional mobility skills in hallway for 450 feet for one trial with cues for amplitude of gait.                 OT Education - 03/23/17 1614    Education provided Yes   Education Details Explanation of daily log of symptoms when he feels like his legs are heavy, painful and difficulty with walking.   Person(s) Educated Patient;Spouse   Methods Explanation;Demonstration;Verbal cues   Comprehension Verbalized understanding;Returned demonstration;Verbal cues required             OT Long Term Goals - 03/14/17 1905      OT LONG TERM GOAL #1   Title Patient will demonstrate increase right hand grip to be able to cut food with modified independence.    Baseline --   Time 12   Period Weeks   Status Partially Met     OT LONG TERM GOAL #2   Title Patient will complete donning long sleeve shirt with modified independence.   Baseline requires assist with long sleeves, can perform short sleeves.   Time 12   Period Weeks   Status Partially Met     OT LONG TERM GOAL #3   Title Patient will improve coordination to manage buttons on clothing with occasional assistance only.   Baseline requires assist with buttons each  time   Time 12   Period Weeks   Status Partially Met     OT LONG TERM GOAL #4   Title Patient will complete managing pants after toileting with modified independence.    Baseline assist most days    Time 12   Period Weeks   Status On-going     OT LONG TERM GOAL #5   Title Patient will improve hand strength bilaterally to be able to don socks with modified independence.    Baseline unable at evaluation   Time 4   Period Weeks   Status Achieved  OT LONG TERM GOAL #6   Title Patient will improve gait speed and endurance and be able to walk 1050 feet in 6 minutes to negotiate around the home and community safely in 4 weeks   Baseline 920 feet prior to LSVT BIG, 1195 feet at 16th visit   Time 4   Period Weeks   Status Achieved     OT LONG TERM GOAL #7   Title Patient will complete HEP for maximal daily exercises with modified independence in 4 weeks   Time 12   Period Weeks   Status Partially Met     OT LONG TERM GOAL #8   Title Patient will transfer from sit to stand without the use of arms safely and independently from a variety of chairs/surfaces in 4 weeks.    Baseline difficulty from lower surfaces   Time 4   Period Weeks   Status Achieved     OT LONG TERM GOAL  #9   Baseline Patient will demonstrate decreased episodes of freezing of behaviors from score of 16 to score of less than 12.   Time 4   Period Weeks   Status Achieved               Plan - 03/23/17 1616    Clinical Impression Statement Although patient made excellent gains during intensive portion of LSVT BIG program, he now feels like he has regressed some since finishing up about 1.5 weeks ago.  He reports in the last week he has felt his legs were heavy, difficulty walking and increased episodes of freezing of gait.  It sounds as if this is happening towards the last hour prior to his next dosages of medication.  Recommended he start a log of his symptoms for the next couple of weeks until his next  MD appt to determine any trends.  He demonstrated increased need for cues and assist with daily exercises as well and increased freezing behaviors during exercises and functional mobility this date.  He is aware of this and we discussed the need to be consistent with daily  exercises at home and to attend therapy at least 2 times a week in the coming weeks to further work on these skills.    Occupational Profile and client history currently impacting functional performance progressive disease process, freezing of gait behaviors, fall risk   Occupational performance deficits (Please refer to evaluation for details): ADL's;IADL's;Rest and Sleep;Leisure;Social Participation   Rehab Potential Good   Current Impairments/barriers affecting progress: positive: family support, negative: level of motivation, progressive disease   OT Frequency 2x / week   OT Duration 12 weeks   OT Treatment/Interventions Self-care/ADL training;Moist Heat;DME and/or AE instruction;Patient/family education;Therapeutic exercises;Balance training;Therapeutic exercise;Therapeutic activities;Neuromuscular education;Functional Mobility Training;Manual Therapy   Consulted and Agree with Plan of Care Patient;Family member/caregiver   Family Member Consulted wife      Patient will benefit from skilled therapeutic intervention in order to improve the following deficits and impairments:  Abnormal gait, Decreased coordination, Decreased range of motion, Difficulty walking, Decreased endurance, Decreased safety awareness, Decreased activity tolerance, Decreased balance, Impaired UE functional use, Pain, Decreased mobility, Decreased strength  Visit Diagnosis: Difficulty in walking, not elsewhere classified  Unsteadiness on feet  Muscle weakness (generalized)  Other lack of coordination  Abnormal posture    Problem List Patient Active Problem List   Diagnosis Date Noted  . BPH with urinary obstruction 11/14/2016  . Urinary  frequency 10/10/2016  . Microscopic hematuria 10/10/2016  . Parkinson's syndrome (  [place]) 06/14/2016  . Arthritis 06/14/2016  . Depression 06/14/2016  . Heartburn 06/14/2016  . Urinary incontinence 06/14/2016  . Skin lesion 06/14/2016  . Dementia 06/14/2016  . Bilateral edema of lower extremity 06/14/2016   Achilles Dunk, OTR/L, CLT  Braedyn Kauk 03/23/2017, 4:28 PM  [place] MAIN [facility] SERVICES [address] [place], [place], [address] Phone: [phone]   Fax:  [phone]  Name: Domenique Southers MRN: [id number] Date of Birth: Mar 22, 1933

## 2017-03-24 ENCOUNTER — Ambulatory Visit: Payer: Medicare Other | Attending: Neurology | Admitting: Occupational Therapy

## 2017-03-24 ENCOUNTER — Encounter: Payer: Self-pay | Admitting: Occupational Therapy

## 2017-03-24 ENCOUNTER — Encounter: Payer: Medicare Other | Admitting: Occupational Therapy

## 2017-03-24 DIAGNOSIS — M6281 Muscle weakness (generalized): Secondary | ICD-10-CM | POA: Diagnosis present

## 2017-03-24 DIAGNOSIS — M25511 Pain in right shoulder: Secondary | ICD-10-CM | POA: Diagnosis present

## 2017-03-24 DIAGNOSIS — R293 Abnormal posture: Secondary | ICD-10-CM | POA: Insufficient documentation

## 2017-03-24 DIAGNOSIS — G8929 Other chronic pain: Secondary | ICD-10-CM | POA: Insufficient documentation

## 2017-03-24 DIAGNOSIS — R278 Other lack of coordination: Secondary | ICD-10-CM | POA: Diagnosis present

## 2017-03-24 DIAGNOSIS — R262 Difficulty in walking, not elsewhere classified: Secondary | ICD-10-CM | POA: Insufficient documentation

## 2017-03-24 DIAGNOSIS — R2681 Unsteadiness on feet: Secondary | ICD-10-CM | POA: Diagnosis present

## 2017-03-24 NOTE — Therapy (Signed)
[place] [facility] MAIN REHAB SERVICES [address] [place], [place], [address] Phone: [phone]   Fax:  [phone]  Occupational Therapy Treatment/progress note  Patient Details  Name: Luke Durham MRN: [id number] Date of Birth: 02/23/1933 Referring Provider: [place]  Encounter Date: 03/24/2017      OT End of Session - 03/24/17 1517    Visit Number 30   Number of Visits 53   Date for OT Re-Evaluation 06/01/17   Authorization Type Medicare visit 30   OT Start Time 1400   OT Stop Time 1505   OT Time Calculation (min) 65 min   Activity Tolerance Patient tolerated treatment well   Behavior During Therapy WFL for tasks assessed/performed      Past Medical History:  Diagnosis Date  . Anxiety   . Arthritis   . Asthma    childhood asthma  . Cancer ([facility]) 7 years ago   lymphoma, [facility] (recent)  . Chronic kidney disease   . Colon polyps   . GERD (gastroesophageal reflux disease)   . History of kidney stones   . Parkinson disease ([facility])     Past Surgical History:  Procedure Laterality Date  . COLON SURGERY    . CYSTOSCOPY WITH LITHOLAPAXY N/A 11/14/2016   Procedure: CYSTOSCOPY WITH LITHOLAPAXY;  Surgeon: Ashley Brandon, MD;  Location: [facility] ORS;  Service: Urology;  Laterality: N/A;  . EYE SURGERY Bilateral    Cataract Extraction with IOL  . HERNIA REPAIR  20 years  . MOHS SURGERY    . SMALL INTESTINE SURGERY     Per patient 7 years  . TRANSURETHRAL RESECTION OF PROSTATE N/A 11/14/2016   Procedure: TRANSURETHRAL RESECTION OF THE PROSTATE (TURP);  Surgeon: Ashley Brandon, MD;  Location: [facility] ORS;  Service: Urology;  Laterality: N/A;    There were no vitals filed for this visit.      Subjective Assessment - 03/24/17 1516    Pertinent History Patient reports he was diagnosed with Parkinson's disease about 10 plus years ago.  He reports a more recent decline in function in his daily activities in the last 6-12 months.  He has been seeing PT  for the last couple months.   Patient Stated Goals Patient reports he wants to be able to do more for himself and around the house.    Currently in Pain? No/denies   Pain Score 0-No pain                      OT Treatments/Exercises (OP) - 03/24/17 1524      ADLs   ADL Comments Patient seen for posture exercises at the wall for 10 reps with 10 sec hold with cues for shoulder retraction and head position.   Reaching to feet to tie shoes, crossed leg method on each side for 5 reps each.  Discussed issues with  toileting at night over the last week and having increased freezing of gait behaviors. Instructed to try techniques such as turning light on, go words, step back then forwards, etc.      Neurological Re-education Exercises   Other Exercises 1 Patient seen for instruction of LSVT BIG exercises: LSVT Daily Session Maximal Daily Exercises: Sustained movements are designed to rescale the amplitude of movement output for generalization to daily functional activities. Performed as follows for 1 set of 10 repetitions each: Multi directional sustained movements- 1) Floor to ceiling, 2) Side to side. Multi directional Repetitive movements performed in standing and are designed to   provide retraining effort needed for sustained muscle activation in tasks Performed as follows: 3) Step and reach forward, 4) Step and Reach Backwards, 5) Step and reach sideways, 6) Rock and reach forward/backward, 7) Rock and reach sideways. Performed with adapted technique with use of chair for all exercises in standing, therapist cues in order to help guide and direct patient at home for home program. Patient required CGA assist for all exercises in standing, moderate to max verbal and tactile cues provided. Patient seen for additional focus on navigating in small spaces with walker to address freezing of gait behaviors. Reciprocal toe tapping at staircase for 10 repetitions each side with SBA assist and verbal  cues. Patient negotiating 5 steps with CGA and cues with patient performing quarter turns with moderate verbal cues, therapist demo and CGA at the stair landing. Functional mobility skills in hallway for 500 feet for one trial with cues for amplitude of gait, turning and one instance of hesitation/freezing.                OT Education - 03/24/17 1516    Education provided Yes   Education Details daily exercises, posture, freezing of gait at night   Person(s) Educated Patient;Spouse   Methods Explanation;Demonstration;Verbal cues   Comprehension Verbal cues required;Returned demonstration;Verbalized understanding             OT Long Term Goals - 03/24/17 1523      OT LONG TERM GOAL #1   Title Patient will demonstrate increase right hand grip to be able to cut food with modified independence.    Time 12   Period Weeks   Status Partially Met     OT LONG TERM GOAL #2   Title Patient will complete donning long sleeve shirt with modified independence.   Baseline requires assist with long sleeves, can perform short sleeves.   Time 12   Period Weeks   Status Partially Met     OT LONG TERM GOAL #3   Title Patient will improve coordination to manage buttons on clothing with occasional assistance only.   Baseline requires assist with buttons each time   Time 12   Period Weeks   Status Partially Met     OT LONG TERM GOAL #4   Title Patient will complete managing pants after toileting with modified independence.    Baseline assist most days    Time 12   Period Weeks   Status On-going     OT LONG TERM GOAL #5   Title Patient will improve hand strength bilaterally to be able to don socks with modified independence.    Baseline unable at evaluation   Time 4   Period Weeks   Status Achieved     OT LONG TERM GOAL #6   Title Patient will improve gait speed and endurance and be able to walk 1050 feet in 6 minutes to negotiate around the home and community safely in 4 weeks    Baseline 920 feet prior to LSVT BIG, 1195 feet at 16th visit   Time 4   Period Weeks   Status Achieved     OT LONG TERM GOAL #7   Title Patient will complete HEP for maximal daily exercises with modified independence in 4 weeks   Time 12   Period Weeks   Status Partially Met     OT LONG TERM GOAL #8   Title Patient will transfer from sit to stand without the use of arms safely and independently from  a variety of chairs/surfaces in 4 weeks.    Baseline difficulty from lower surfaces   Time 4   Period Weeks   Status Achieved     OT LONG TERM GOAL  #9   Baseline Patient will demonstrate decreased episodes of freezing of behaviors from score of 16 to score of less than 12.   Time 4   Period Weeks   Status Achieved               Plan - 04/20/2017 1517    Clinical Impression Statement Patient did well today versus performance last date.   He responds best to repetition of tasks, taking medication on time, cues for exercises and modeling at times. He demonstrated decreased episodes of hesitation/freezing this date but reports it is worse at night when getting up to go to the bathroom.  Recommend he add this to his journal to speak to the doctor about.  Will continue to work towards improving balance, functional transfers/mobility, decreasing fall risk and improving posture for daily tasks. He continues to benefit from skilled OT services to maximize safety and independence in daily tasks.    Occupational Profile and client history currently impacting functional performance progressive disease process, freezing of gait behaviors, fall risk   Occupational performance deficits (Please refer to evaluation for details): ADL's;IADL's;Rest and Sleep   Rehab Potential Good   Current Impairments/barriers affecting progress: positive: family support, negative: level of motivation, progressive disease   OT Frequency 2x / week   OT Duration 12 weeks   OT Treatment/Interventions Self-care/ADL  training;Moist Heat;DME and/or AE instruction;Patient/family education;Therapeutic exercises;Balance training;Therapeutic exercise;Therapeutic activities;Neuromuscular education;Functional Mobility Training;Manual Therapy   Consulted and Agree with Plan of Care Patient;Family member/caregiver      Patient will benefit from skilled therapeutic intervention in order to improve the following deficits and impairments:  Abnormal gait, Decreased coordination, Decreased range of motion, Difficulty walking, Decreased endurance, Decreased safety awareness, Decreased activity tolerance, Decreased balance, Impaired UE functional use, Pain, Decreased mobility, Decreased strength  Visit Diagnosis: Difficulty in walking, not elsewhere classified  Unsteadiness on feet  Muscle weakness (generalized)  Other lack of coordination  Abnormal posture      G-Codes - 2017-04-20 1523    Functional Assessment Tool Used (Outpatient only) clinical judgment, ADL assessment, 5 times sit to stand, freezing of gait questionairre   Functional Limitation Self care   Self Care Current Status (G4010) At least 40 percent but less than 60 percent impaired, limited or restricted   Self Care Goal Status (U7253) At least 20 percent but less than 40 percent impaired, limited or restricted      Problem List Patient Active Problem List   Diagnosis Date Noted  . BPH with urinary obstruction 11/14/2016  . Urinary frequency 10/10/2016  . Microscopic hematuria 10/10/2016  . Parkinson's syndrome ([place]) 06/14/2016  . Arthritis 06/14/2016  . Depression 06/14/2016  . Heartburn 06/14/2016  . Urinary incontinence 06/14/2016  . Skin lesion 06/14/2016  . Dementia 06/14/2016  . Bilateral edema of lower extremity 06/14/2016   Achilles Dunk, OTR/L, CLT  Nomi Rudnicki 04-20-2017, 3:31 PM  [place] MAIN [facility] SERVICES [address] [place], [place], [address] Phone: [phone]   Fax:   [phone]  Name: Randell Teare MRN: [id number] Date of Birth: 17-Oct-1933

## 2017-03-27 ENCOUNTER — Ambulatory Visit: Payer: Medicare Other | Admitting: Occupational Therapy

## 2017-03-27 ENCOUNTER — Encounter: Payer: Medicare Other | Admitting: Occupational Therapy

## 2017-03-27 DIAGNOSIS — R293 Abnormal posture: Secondary | ICD-10-CM

## 2017-03-27 DIAGNOSIS — R2681 Unsteadiness on feet: Secondary | ICD-10-CM

## 2017-03-27 DIAGNOSIS — R262 Difficulty in walking, not elsewhere classified: Secondary | ICD-10-CM

## 2017-03-27 DIAGNOSIS — M6281 Muscle weakness (generalized): Secondary | ICD-10-CM

## 2017-03-27 DIAGNOSIS — R278 Other lack of coordination: Secondary | ICD-10-CM

## 2017-03-28 ENCOUNTER — Encounter: Payer: Medicare Other | Admitting: Occupational Therapy

## 2017-03-29 ENCOUNTER — Encounter: Payer: Self-pay | Admitting: Occupational Therapy

## 2017-03-30 ENCOUNTER — Ambulatory Visit: Payer: Medicare Other | Admitting: Occupational Therapy

## 2017-03-30 ENCOUNTER — Encounter: Payer: Self-pay | Admitting: Occupational Therapy

## 2017-03-30 DIAGNOSIS — R293 Abnormal posture: Secondary | ICD-10-CM

## 2017-03-30 DIAGNOSIS — M6281 Muscle weakness (generalized): Secondary | ICD-10-CM

## 2017-03-30 DIAGNOSIS — R2681 Unsteadiness on feet: Secondary | ICD-10-CM

## 2017-03-30 DIAGNOSIS — R262 Difficulty in walking, not elsewhere classified: Secondary | ICD-10-CM | POA: Diagnosis not present

## 2017-03-30 DIAGNOSIS — R278 Other lack of coordination: Secondary | ICD-10-CM

## 2017-03-30 NOTE — Therapy (Signed)
[place] MAIN [facility] SERVICES [address] [place], [place], [address] Phone: [phone]   Fax:  [phone]  Occupational Therapy Treatment  Patient Details  Name: Luke Durham MRN: [id number] Date of Birth: 1933/02/01 Referring Provider: Marlyn Corporal  Encounter Date: 03/30/2017      OT End of Session - 03/30/17 2009    Visit Number 32   Number of Visits 64   Date for OT Re-Evaluation 06/01/17   Authorization Type Medicare visit 73   OT Start Time 1100   OT Stop Time 1201   OT Time Calculation (min) 61 min   Activity Tolerance Patient tolerated treatment well   Behavior During Therapy [facility] for tasks assessed/performed      Past Medical History:  Diagnosis Date  . Anxiety   . Arthritis   . Asthma    childhood asthma  . Cancer ([place]) 7 years ago   lymphoma, [place] (recent)  . Chronic kidney disease   . Colon polyps   . GERD (gastroesophageal reflux disease)   . History of kidney stones   . Parkinson disease [facility])     Past Surgical History:  Procedure Laterality Date  . COLON SURGERY    . CYSTOSCOPY WITH LITHOLAPAXY N/A 11/14/2016   Procedure: CYSTOSCOPY WITH LITHOLAPAXY;  Surgeon: Hollice Espy, MD;  Location: [facility] ORS;  Service: Urology;  Laterality: N/A;  . EYE SURGERY Bilateral    Cataract Extraction with IOL  . HERNIA REPAIR  20 years  . MOHS SURGERY    . SMALL INTESTINE SURGERY     Per patient 7 years  . TRANSURETHRAL RESECTION OF PROSTATE N/A 11/14/2016   Procedure: TRANSURETHRAL RESECTION OF THE PROSTATE (TURP);  Surgeon: Hollice Espy, MD;  Location: [facility] ORS;  Service: Urology;  Laterality: N/A;    There were no vitals filed for this visit.      Subjective Assessment - 03/30/17 2008    Subjective  Patient reports he has continued to perform much better this week at home, leg pain has diminished and he is working on timing of toileting more.   Pertinent History Patient reports he was diagnosed with Parkinson's  disease about 10 plus years ago.  He reports a more recent decline in function in his daily activities in the last 6-12 months.  He has been seeing PT for the last couple months.   Patient Stated Goals Patient reports he wants to be able to do more for himself and around the house.    Currently in Pain? Yes   Pain Score 2    Pain Location Shoulder   Pain Orientation Right   Pain Descriptors / Indicators Aching   Pain Type Chronic pain   Pain Onset More than a month ago   Pain Frequency Intermittent                      OT Treatments/Exercises (OP) - 03/30/17 2014      ADLs   ADL Comments Review of toileting schedule and how patient is doing this week with this task.  Additional recommendations provided with not going any longer than 3.5 hours and to head to the bathroom before strong urge occurs.  Postural exercises away from wall with alternating step forward.     Neurological Re-education Exercises   Other Exercises 1 Value Information     Other Exercises 2 Functional mobility skills 2 sets of 250 feet with cues for amplitude of steps, size of gait and turns.  OT Education - 03/30/17 2009    Education provided Yes   Education Details toileting schedule, freezing of gait   Person(s) Educated Patient;Spouse   Methods Explanation;Demonstration;Verbal cues   Comprehension Verbal cues required;Returned demonstration;Verbalized understanding             OT Long Term Goals - 03/24/17 1523      OT LONG TERM GOAL #1   Title Patient will demonstrate increase right hand grip to be able to cut food with modified independence.    Time 12   Period Weeks   Status Partially Met     OT LONG TERM GOAL #2   Title Patient will complete donning long sleeve shirt with modified independence.   Baseline requires assist with long sleeves, can perform short sleeves.   Time 12   Period Weeks   Status Partially Met     OT LONG TERM GOAL #3   Title  Patient will improve coordination to manage buttons on clothing with occasional assistance only.   Baseline requires assist with buttons each time   Time 12   Period Weeks   Status Partially Met     OT LONG TERM GOAL #4   Title Patient will complete managing pants after toileting with modified independence.    Baseline assist most days    Time 12   Period Weeks   Status On-going     OT LONG TERM GOAL #5   Title Patient will improve hand strength bilaterally to be able to don socks with modified independence.    Baseline unable at evaluation   Time 4   Period Weeks   Status Achieved     OT LONG TERM GOAL #6   Title Patient will improve gait speed and endurance and be able to walk 1050 feet in 6 minutes to negotiate around the home and community safely in 4 weeks   Baseline 920 feet prior to LSVT BIG, 1195 feet at 16th visit   Time 4   Period Weeks   Status Achieved     OT LONG TERM GOAL #7   Title Patient will complete HEP for maximal daily exercises with modified independence in 4 weeks   Time 12   Period Weeks   Status Partially Met     OT LONG TERM GOAL #8   Title Patient will transfer from sit to stand without the use of arms safely and independently from a variety of chairs/surfaces in 4 weeks.    Baseline difficulty from lower surfaces   Time 4   Period Weeks   Status Achieved     OT LONG TERM GOAL  #9   Baseline Patient will demonstrate decreased episodes of freezing of behaviors from score of 16 to score of less than 12.   Time 4   Period Weeks   Status Achieved               Plan - 03/30/17 2010    Clinical Impression Statement Patient with diminished pain in legs this week, increased activity at home and in the clinic this week compared to last.  He demonstrates decreased freezing of gait behaviors when he is more consistent with movement, daily exercises and when he is utilizing principles of LSVT BIG.  Encouraged patient to move more over this next  week as he has done this week consistently.  He will continue to work on schedule for bladder and will not go more than 3.5 hours without toileting.  Recommend he go  before increased urge occurs.   Rehab Potential Good   Current Impairments/barriers affecting progress: positive: family support, negative: level of motivation, progressive disease   OT Frequency 2x / week   OT Duration 12 weeks   OT Treatment/Interventions Self-care/ADL training;Moist Heat;DME and/or AE instruction;Patient/family education;Therapeutic exercises;Balance training;Therapeutic exercise;Therapeutic activities;Neuromuscular education;Functional Mobility Training;Manual Therapy   Consulted and Agree with Plan of Care Patient;Family member/caregiver   Family Member Consulted wife      Patient will benefit from skilled therapeutic intervention in order to improve the following deficits and impairments:  Abnormal gait, Decreased coordination, Decreased range of motion, Difficulty walking, Decreased endurance, Decreased safety awareness, Decreased activity tolerance, Decreased balance, Impaired UE functional use, Pain, Decreased mobility, Decreased strength  Visit Diagnosis: Difficulty in walking, not elsewhere classified  Unsteadiness on feet  Muscle weakness (generalized)  Other lack of coordination  Abnormal posture    Problem List Patient Active Problem List   Diagnosis Date Noted  . BPH with urinary obstruction 11/14/2016  . Urinary frequency 10/10/2016  . Microscopic hematuria 10/10/2016  . Parkinson's syndrome ([place]) 06/14/2016  . Arthritis 06/14/2016  . Depression 06/14/2016  . Heartburn 06/14/2016  . Urinary incontinence 06/14/2016  . Skin lesion 06/14/2016  . Dementia 06/14/2016  . Bilateral edema of lower extremity 06/14/2016   Achilles Dunk, OTR/L, CLT  Lovett,Amy 03/30/2017, 8:17 PM  [place] MAIN [facility] SERVICES [address] [place], [place],  [address] Phone: [phone]   Fax:  [phone]  Name: Luke Durham MRN: [id number] Date of Birth: 1933/03/07

## 2017-03-30 NOTE — Therapy (Signed)
[place] MAIN [facility] SERVICES [address] [place], [place], [address] Phone: [phone]   Fax:  [phone]  Occupational Therapy Treatment  Patient Details  Name: Luke Durham MRN: [id number] Date of Birth: [birth year] Referring Provider: Marlyn Corporal  Encounter Date: 03/27/2017      OT End of Session - 03/30/17 1035    Visit Number 31   Number of Visits 56   Date for OT Re-Evaluation 06/01/17   Authorization Type Medicare visit 51   OT Start Time 1055   OT Stop Time 1158   OT Time Calculation (min) 63 min   Activity Tolerance Patient tolerated treatment well   Behavior During Therapy [facility] for tasks assessed/performed      Past Medical History:  Diagnosis Date  . Anxiety   . Arthritis   . Asthma    childhood asthma  . Cancer ([place]) 7 years ago   lymphoma, [place] (recent)  . Chronic kidney disease   . Colon polyps   . GERD (gastroesophageal reflux disease)   . History of kidney stones   . Parkinson disease [facility])     Past Surgical History:  Procedure Laterality Date  . COLON SURGERY    . CYSTOSCOPY WITH LITHOLAPAXY N/A 11/14/2016   Procedure: CYSTOSCOPY WITH LITHOLAPAXY;  Surgeon: Hollice Espy, MD;  Location: [facility] ORS;  Service: Urology;  Laterality: N/A;  . EYE SURGERY Bilateral    Cataract Extraction with IOL  . HERNIA REPAIR  20 years  . MOHS SURGERY    . SMALL INTESTINE SURGERY     Per patient 7 years  . TRANSURETHRAL RESECTION OF PROSTATE N/A 11/14/2016   Procedure: TRANSURETHRAL RESECTION OF THE PROSTATE (TURP);  Surgeon: Hollice Espy, MD;  Location: [facility] ORS;  Service: Urology;  Laterality: N/A;    There were no vitals filed for this visit.      Subjective Assessment - 03/29/17 1023    Subjective  Patient reports after performing therapy last week on Thursday and Friday, he felt better over the weekend, less freezing of gait (but still present) and less pain in his legs with walking and moving around.      Pertinent History Patient reports he was diagnosed with Parkinson's disease about 10 plus years ago.  He reports a more recent decline in function in his daily activities in the last 6-12 months.  He has been seeing PT for the last couple months.   Patient Stated Goals Patient reports he wants to be able to do more for himself and around the house.    Currently in Pain? No/denies   Pain Score 0-No pain   Multiple Pain Sites No                      OT Treatments/Exercises (OP) - 03/30/17 1036      ADLs   ADL Comments Patient seen for posture exercises at the wall for 10 reps with 10 sec hold with cues for shoulder retraction and head position. Patient reports he has some issues getting to the bathroom on time and complicated with freezing behaviors at night.  Discussed environmental changes such as turning on the light, implementing freezing of gait techniques, use of depends and being on a timed toileting schedule to avoid extreme urges which appear to trigger freezing behaviors.  He reports he has about one accident of incontinence every 2-3 days.       Neurological Re-education Exercises   Other Exercises 1 Patient seen for  instruction of LSVT BIG exercises: LSVT Daily Session Maximal Daily Exercises: Sustained movements are designed to rescale the amplitude of movement output for generalization to daily functional activities. Performed as follows for 1 set of 10 repetitions each: Multi directional sustained movements- 1) Floor to ceiling, 2) Side to side. Multi directional Repetitive movements performed in standing and are designed to provide retraining effort needed for sustained muscle activation in tasks Performed as follows: 3) Step and reach forward, 4) Step and Reach Backwards, 5) Step and reach sideways, 6) Rock and reach forward/backward, 7) Rock and reach sideways. Performed with adapted technique with use of chair for all exercises in standing, therapist cues in order to help  guide and direct patient at home for home program. Patient required CGA assist for all exercises in standing, moderate to max verbal and tactile cues provided.   Patient still gets confused between arm and leg movements for stepping forwards and stepping to the side and requires modeling and cues. Patient seen for focus on navigating in small spaces with walker to address freezing of gait behaviors especially for reported nighttime toileting. Reciprocal toe tapping at staircase for 10 repetitions each side with SBA assist and verbal cues. Patient negotiating 5 steps with CGA and cues with patient performing quarter turns with moderate verbal cues, therapist demo and CGA at the stair landing. Functional mobility skills in hallway for 300 feet for 2 trials with cues for amplitude of gait.                OT Education - 03/30/17 1034    Education provided Yes   Education Details functional mobility/transfers, freezing and leg pain   Person(s) Educated Patient   Methods Explanation;Demonstration;Verbal cues   Comprehension Verbal cues required;Returned demonstration;Verbalized understanding             OT Long Term Goals - 03/24/17 1523      OT LONG TERM GOAL #1   Title Patient will demonstrate increase right hand grip to be able to cut food with modified independence.    Time 12   Period Weeks   Status Partially Met     OT LONG TERM GOAL #2   Title Patient will complete donning long sleeve shirt with modified independence.   Baseline requires assist with long sleeves, can perform short sleeves.   Time 12   Period Weeks   Status Partially Met     OT LONG TERM GOAL #3   Title Patient will improve coordination to manage buttons on clothing with occasional assistance only.   Baseline requires assist with buttons each time   Time 12   Period Weeks   Status Partially Met     OT LONG TERM GOAL #4   Title Patient will complete managing pants after toileting with modified  independence.    Baseline assist most days    Time 12   Period Weeks   Status On-going     OT LONG TERM GOAL #5   Title Patient will improve hand strength bilaterally to be able to don socks with modified independence.    Baseline unable at evaluation   Time 4   Period Weeks   Status Achieved     OT LONG TERM GOAL #6   Title Patient will improve gait speed and endurance and be able to walk 1050 feet in 6 minutes to negotiate around the home and community safely in 4 weeks   Baseline 920 feet prior to LSVT BIG, 1195 feet at  16th visit   Time 4   Period Weeks   Status Achieved     OT LONG TERM GOAL #7   Title Patient will complete HEP for maximal daily exercises with modified independence in 4 weeks   Time 12   Period Weeks   Status Partially Met     OT LONG TERM GOAL #8   Title Patient will transfer from sit to stand without the use of arms safely and independently from a variety of chairs/surfaces in 4 weeks.    Baseline difficulty from lower surfaces   Time 4   Period Weeks   Status Achieved     OT LONG TERM GOAL  #9   Baseline Patient will demonstrate decreased episodes of freezing of behaviors from score of 16 to score of less than 12.   Time 4   Period Weeks   Status Achieved               Plan - 03/30/17 1035    Clinical Impression Statement Patient with improved performance this date and reports decreased leg pain over the weekend.  This may indicate he needs to be consistent with exercises at home and increase his daily walking since when he is enrolled in the intensive portion of therapy he had a much improved performance in all areas and once he took a short break of no therapy for almost 2 weeks he had a decline in function, increase pain and increased incidence of freezing behaviors again.  He is concerned about freezing behaviors with toileting skills during the day and at night and has incontinence at least once every 2-3 days and tends to freeze and not  make it to the bathroom in time.  Will continue to focus on ways to help diminish episodes of freezing and incontinence with strategies and a more timed bladder program.    Rehab Potential Good   Current Impairments/barriers affecting progress: positive: family support, negative: level of motivation, progressive disease   OT Frequency 2x / week   OT Duration 12 weeks   OT Treatment/Interventions Self-care/ADL training;Moist Heat;DME and/or AE instruction;Patient/family education;Therapeutic exercises;Balance training;Therapeutic exercise;Therapeutic activities;Neuromuscular education;Functional Mobility Training;Manual Therapy   Consulted and Agree with Plan of Care Patient;Family member/caregiver   Family Member Consulted wife      Patient will benefit from skilled therapeutic intervention in order to improve the following deficits and impairments:  Abnormal gait, Decreased coordination, Decreased range of motion, Difficulty walking, Decreased endurance, Decreased safety awareness, Decreased activity tolerance, Decreased balance, Impaired UE functional use, Pain, Decreased mobility, Decreased strength  Visit Diagnosis: Difficulty in walking, not elsewhere classified  Unsteadiness on feet  Muscle weakness (generalized)  Other lack of coordination  Abnormal posture    Problem List Patient Active Problem List   Diagnosis Date Noted  . BPH with urinary obstruction 11/14/2016  . Urinary frequency 10/10/2016  . Microscopic hematuria 10/10/2016  . Parkinson's syndrome ([place]) 06/14/2016  . Arthritis 06/14/2016  . Depression 06/14/2016  . Heartburn 06/14/2016  . Urinary incontinence 06/14/2016  . Skin lesion 06/14/2016  . Dementia 06/14/2016  . Bilateral edema of lower extremity 06/14/2016    Lovett,Amy 03/30/2017, 10:45 AM  [place] MAIN [facility] SERVICES [address] [place], [place], [address] Phone: [phone]   Fax:   [phone]  Name: Luke Durham MRN: [id number] Date of Birth: 03/13/1933

## 2017-04-10 ENCOUNTER — Ambulatory Visit: Payer: Medicare Other | Admitting: Occupational Therapy

## 2017-04-10 DIAGNOSIS — R293 Abnormal posture: Secondary | ICD-10-CM

## 2017-04-10 DIAGNOSIS — R2681 Unsteadiness on feet: Secondary | ICD-10-CM

## 2017-04-10 DIAGNOSIS — R278 Other lack of coordination: Secondary | ICD-10-CM

## 2017-04-10 DIAGNOSIS — R262 Difficulty in walking, not elsewhere classified: Secondary | ICD-10-CM

## 2017-04-10 DIAGNOSIS — M6281 Muscle weakness (generalized): Secondary | ICD-10-CM

## 2017-04-11 ENCOUNTER — Encounter: Payer: Self-pay | Admitting: Occupational Therapy

## 2017-04-11 NOTE — Therapy (Signed)
[place] MAIN [facility] SERVICES [address] [place], [place], [address] Phone: [phone]   Fax:  [phone]  Occupational Therapy Treatment  Patient Details  Name: Luke Durham MRN: [id number] Date of Birth: Jun 20, 1933 Referring Provider: Marlyn Corporal  Encounter Date: 04/10/2017      OT End of Session - 04/11/17 1548    Visit Number 33   Number of Visits 70   Date for OT Re-Evaluation 06/01/17   Authorization Type Medicare visit 97   OT Start Time 1100   OT Stop Time 1159   OT Time Calculation (min) 59 min   Activity Tolerance Patient tolerated treatment well   Behavior During Therapy [facility] for tasks assessed/performed      Past Medical History:  Diagnosis Date  . Anxiety   . Arthritis   . Asthma    childhood asthma  . Cancer ([place]) 7 years ago   lymphoma, [place] (recent)  . Chronic kidney disease   . Colon polyps   . GERD (gastroesophageal reflux disease)   . History of kidney stones   . Parkinson disease [facility])     Past Surgical History:  Procedure Laterality Date  . COLON SURGERY    . CYSTOSCOPY WITH LITHOLAPAXY N/A 11/14/2016   Procedure: CYSTOSCOPY WITH LITHOLAPAXY;  Surgeon: Hollice Espy, MD;  Location: [facility] ORS;  Service: Urology;  Laterality: N/A;  . EYE SURGERY Bilateral    Cataract Extraction with IOL  . HERNIA REPAIR  20 years  . MOHS SURGERY    . SMALL INTESTINE SURGERY     Per patient 7 years  . TRANSURETHRAL RESECTION OF PROSTATE N/A 11/14/2016   Procedure: TRANSURETHRAL RESECTION OF THE PROSTATE (TURP);  Surgeon: Hollice Espy, MD;  Location: [facility] ORS;  Service: Urology;  Laterality: N/A;    There were no vitals filed for this visit.      Subjective Assessment - 04/11/17 1510    Subjective  Patient reports he had another episode of his legs hurting and aching at home last week several days after his last treatment.  He reports he is not sure why this occurs and has been trying to be consistent at moving  around.  He had some freezing of gait during the last week but feels it has been better today.  Wife reports patient's anxiety has also been higher in recent weeks however unsure why this is.    Pertinent History Patient reports he was diagnosed with Parkinson's disease about 10 plus years ago.  He reports a more recent decline in function in his daily activities in the last 6-12 months.  He has been seeing PT for the last couple months.   Patient Stated Goals Patient reports he wants to be able to do more for himself and around the house.    Currently in Pain? No/denies   Pain Score 0-No pain                      OT Treatments/Exercises (OP) - 04/11/17 1544      Neurological Re-education Exercises   Other Exercises 1 Patient seen for instruction of LSVT BIG exercises: LSVT Daily Session Maximal Daily Exercises: Sustained movements are designed to rescale the amplitude of movement output for generalization to daily functional activities. Performed as follows for 1 set of 10 repetitions each: Multi directional sustained movements- 1) Floor to ceiling, 2) Side to side. Multi directional Repetitive movements performed in standing and are designed to provide retraining effort needed for  sustained muscle activation in tasks Performed as follows: 3) Step and reach forward, 4) Step and Reach Backwards, 5) Step and reach sideways, 6) Rock and reach forward/backward, 7) Rock and reach sideways. Performed with adapted technique with use of chair for all exercises in standing, therapist cues in order to help guide and direct patient at home for home program. Patient required CGA assist for all exercises in standing, moderate verbal and tactile cues provided. Patient continues to demonstrate some problems with identifying the correct arm and leg movements for stepping forwards and stepping to the side. Patient seen for focus on navigating in small spaces with walker to address freezing of gait behaviors  especially for reported nighttime toileting and retrieving clothing from the closet. Reciprocal toe tapping at staircase for 10 repetitions each side with SBA assist and verbal cues. Patient negotiating 5 steps with SBA and cues with patient performing quarter turns with moderate verbal cues, therapist demo and SBA at the stair landing.    Other Exercises 2 Patient seen for postural exercises in standing with cues for eye gaze, head position and shoulder positioning for multiple trials and repetitions. Patient requesting to try a rollator this date in the clinic to see if he would be able to use it for functional mobility since the rolling walker he has now tends to trend to the right when he is walking.  He uses a u version walker at home.  Patient completed 2 trials of 250 feet with rollator, cues for use of brakes, body positioning and turning.                 OT Education - 04/11/17 1548    Education provided Yes   Education Details rollator walker   Person(s) Educated Patient;Spouse   Methods Explanation;Demonstration;Verbal cues   Comprehension Verbal cues required;Returned demonstration;Verbalized understanding             OT Long Term Goals - 03/24/17 1523      OT LONG TERM GOAL #1   Title Patient will demonstrate increase right hand grip to be able to cut food with modified independence.    Time 12   Period Weeks   Status Partially Met     OT LONG TERM GOAL #2   Title Patient will complete donning long sleeve shirt with modified independence.   Baseline requires assist with long sleeves, can perform short sleeves.   Time 12   Period Weeks   Status Partially Met     OT LONG TERM GOAL #3   Title Patient will improve coordination to manage buttons on clothing with occasional assistance only.   Baseline requires assist with buttons each time   Time 12   Period Weeks   Status Partially Met     OT LONG TERM GOAL #4   Title Patient will complete managing pants  after toileting with modified independence.    Baseline assist most days    Time 12   Period Weeks   Status On-going     OT LONG TERM GOAL #5   Title Patient will improve hand strength bilaterally to be able to don socks with modified independence.    Baseline unable at evaluation   Time 4   Period Weeks   Status Achieved     OT LONG TERM GOAL #6   Title Patient will improve gait speed and endurance and be able to walk 1050 feet in 6 minutes to negotiate around the home and community safely in 4  weeks   Baseline 920 feet prior to LSVT BIG, 1195 feet at 16th visit   Time 4   Period Weeks   Status Achieved     OT LONG TERM GOAL #7   Title Patient will complete HEP for maximal daily exercises with modified independence in 4 weeks   Time 12   Period Weeks   Status Partially Met     OT LONG TERM GOAL #8   Title Patient will transfer from sit to stand without the use of arms safely and independently from a variety of chairs/surfaces in 4 weeks.    Baseline difficulty from lower surfaces   Time 4   Period Weeks   Status Achieved     OT LONG TERM GOAL  #9   Baseline Patient will demonstrate decreased episodes of freezing of behaviors from score of 16 to score of less than 12.   Time 4   Period Weeks   Status Achieved               Plan - 04/11/17 1549    Clinical Impression Statement Patient with increased leg pain in quad area again this week after short break from therapy last week. Patient is unsure as to why this has happened now twice.  Last time after he increased his movement and activity he felt the pain went away and he was feeling ok.  Patient wanting to use a rollator for functional mobility outside the home (uses U version walker inside) and requested to try the rollator today.  He was able to perform well with cues and did not have episodes of freezing however the braking system on the rollator is opposite of the system on the U version walker which can be  confusing and potentially be a safety issue.  Patient wants to purchase a rollator however therapist suggested he wait and use the rollator in therapy a few more times before making a final decision.  Patient denies any changes at home or with medication however, they did just get a puppy in the last month.  His wife is concerned about his anxiety increasing in the last couple weeks,  the patient does not feel this has been a source of stress for him but it has been a change in routine.  Question whether the patient has been squatting more to pet the puppy which could be a source of soreness in his quads without him realizing it. Will continue to work towards goals to improve functional mobility, decrease freezing of gait and risk of falls.      Rehab Potential Good   Current Impairments/barriers affecting progress: positive: family support, negative: level of motivation, progressive disease   OT Frequency 2x / week   OT Duration 12 weeks   OT Treatment/Interventions Self-care/ADL training;Moist Heat;DME and/or AE instruction;Patient/family education;Therapeutic exercises;Balance training;Therapeutic exercise;Therapeutic activities;Neuromuscular education;Functional Mobility Training;Manual Therapy   Consulted and Agree with Plan of Care Patient;Family member/caregiver   Family Member Consulted wife      Patient will benefit from skilled therapeutic intervention in order to improve the following deficits and impairments:  Abnormal gait, Decreased coordination, Decreased range of motion, Difficulty walking, Decreased endurance, Decreased safety awareness, Decreased activity tolerance, Decreased balance, Impaired UE functional use, Pain, Decreased mobility, Decreased strength  Visit Diagnosis: Difficulty in walking, not elsewhere classified  Unsteadiness on feet  Muscle weakness (generalized)  Other lack of coordination  Abnormal posture    Problem List Patient Active Problem List    Diagnosis Date  Noted  . BPH with urinary obstruction 11/14/2016  . Urinary frequency 10/10/2016  . Microscopic hematuria 10/10/2016  . Parkinson's syndrome ([place]) 06/14/2016  . Arthritis 06/14/2016  . Depression 06/14/2016  . Heartburn 06/14/2016  . Urinary incontinence 06/14/2016  . Skin lesion 06/14/2016  . Dementia 06/14/2016  . Bilateral edema of lower extremity 06/14/2016   Achilles Dunk, OTR/L, CLT  Lovett,Amy 04/11/2017, 4:34 PM  [place] MAIN [facility] SERVICES [address] [place], [place], [address] Phone: [phone]   Fax:  [phone]  Name: Luke Durham MRN: [id number] Date of Birth: [birth year]

## 2017-04-12 ENCOUNTER — Encounter: Payer: Self-pay | Admitting: Urology

## 2017-04-12 ENCOUNTER — Ambulatory Visit (INDEPENDENT_AMBULATORY_CARE_PROVIDER_SITE_OTHER): Payer: Medicare Other | Admitting: Urology

## 2017-04-12 VITALS — BP 93/61 | HR 66 | Wt 165.0 lb

## 2017-04-12 DIAGNOSIS — Z87448 Personal history of other diseases of urinary system: Secondary | ICD-10-CM

## 2017-04-12 DIAGNOSIS — N390 Urinary tract infection, site not specified: Secondary | ICD-10-CM

## 2017-04-12 DIAGNOSIS — N400 Enlarged prostate without lower urinary tract symptoms: Secondary | ICD-10-CM | POA: Diagnosis not present

## 2017-04-12 LAB — MICROSCOPIC EXAMINATION
Bacteria, UA: NONE SEEN
RBC, UA: NONE SEEN /hpf (ref 0–?)

## 2017-04-12 LAB — URINALYSIS, COMPLETE
Bilirubin, UA: NEGATIVE
Glucose, UA: NEGATIVE
Leukocytes, UA: NEGATIVE
Nitrite, UA: NEGATIVE
RBC, UA: NEGATIVE
Specific Gravity, UA: >1.03 — ABNORMAL HIGH (ref 1.005–1.030)
Urobilinogen, Ur: 1 mg/dL (ref 0.2–1.0)
pH, UA: 5 (ref 5.0–7.5)

## 2017-04-12 LAB — BLADDER SCAN AMB NON-IMAGING

## 2017-04-12 NOTE — Progress Notes (Signed)
04/12/2017 10:33 AM   [name] Dec 10, 1932 [id number]  Referring provider: Mar Daring, PA-C [place], [place] [address]  Chief Complaint  Patient presents with  . Benign Prostatic Hypertrophy    4 mo f/u on bladder stone    HPI: 83 year old male with a history of Parkinson's disease, bladder outlet obstruction, bladder stones who presents today for routine follow-up following TURP, cystolitholapaxy of 11/14/2016.  Since surgery, his been voiding fairly well. He's been able to empty his bladder without difficulty. He notes that his stream is improved. He did have some initial urinary frequency and urgency following the procedure but this is also resolved. He is overall very pleased with the result.  No dysuria or gross hematuria.  He does not complain of any incontinence.  No urinary tract infections since surgery.  PVR today is minimal.     PMH: Past Medical History:  Diagnosis Date  . Anxiety   . Arthritis   . Asthma    childhood asthma  . Cancer ([place]) 7 years ago   lymphoma, [place] (recent)  . Chronic kidney disease   . Colon polyps   . GERD (gastroesophageal reflux disease)   . History of kidney stones   . Parkinson disease [facility])     Surgical History: Past Surgical History:  Procedure Laterality Date  . COLON SURGERY    . CYSTOSCOPY WITH LITHOLAPAXY N/A 11/14/2016   Procedure: CYSTOSCOPY WITH LITHOLAPAXY;  Surgeon: Hollice Espy, MD;  Location: [facility] ORS;  Service: Urology;  Laterality: N/A;  . EYE SURGERY Bilateral    Cataract Extraction with IOL  . HERNIA REPAIR  20 years  . MOHS SURGERY    . SMALL INTESTINE SURGERY     Per patient 7 years  . TRANSURETHRAL RESECTION OF PROSTATE N/A 11/14/2016   Procedure: TRANSURETHRAL RESECTION OF THE PROSTATE (TURP);  Surgeon: Hollice Espy, MD;  Location: [facility] ORS;  Service: Urology;  Laterality: N/A;    Home Medications:  Allergies as of 04/12/2017   No Known Allergies       Medication List       Accurate as of 04/12/17 10:33 AM. Always use your most recent med list.          Amantadine HCl 100 MG tablet Take 100 mg by mouth daily.   carbidopa-levodopa 25-100 MG tablet Commonly known as:  SINEMET IR take 2 tablets by mouth four times a day as directed   docusate sodium 100 MG capsule Commonly known as:  COLACE Take 1 capsule (100 mg total) by mouth 2 (two) times daily.   donepezil 10 MG tablet Commonly known as:  ARICEPT Take 10 mg by mouth at bedtime.   econazole nitrate 1 % cream Apply 1 application topically daily. Applied to feet   furosemide 20 MG tablet Commonly known as:  LASIX Take 1 tablet (20 mg total) by mouth daily.   multivitamin capsule Take 1 capsule by mouth daily.   niacinamide 500 MG tablet   NUPLAZID 17 MG Tabs Generic drug:  Pimavanserin Tartrate Take 17 mg by mouth daily.   OCUVITE PO Take 1 tablet by mouth daily.   sertraline 50 MG tablet Commonly known as:  ZOLOFT Take 1 tablet (50 mg total) by mouth daily.   tamsulosin 0.4 MG Caps capsule Commonly known as:  FLOMAX Take 1 capsule (0.4 mg total) by mouth daily.   THERATEARS OP Place 1-2 drops into both eyes at bedtime.       Allergies:  No Known Allergies  Family History: Family History  Problem Relation Age of Onset  . Heart disease Father   . Bladder Cancer Neg Hx   . Prostate cancer Neg Hx   . Kidney cancer Neg Hx     Social History:  reports that he has never smoked. He has never used smokeless tobacco. He reports that he drinks alcohol. He reports that he does not use drugs.  ROS: UROLOGY Frequent Urination?: Yes Hard to postpone urination?: Yes Burning/pain with urination?: No Get up at night to urinate?: Yes Leakage of urine?: Yes Urine stream starts and stops?: No Trouble starting stream?: No Do you have to strain to urinate?: No Blood in urine?: No Urinary tract infection?: No Sexually transmitted disease?: No Injury to  kidneys or bladder?: No Painful intercourse?: No Weak stream?: No Erection problems?: No Penile pain?: No  Gastrointestinal Nausea?: No Vomiting?: No Indigestion/heartburn?: No Diarrhea?: No Constipation?: Yes  Constitutional Fever: No Night sweats?: No Weight loss?: No Fatigue?: Yes  Skin Skin rash/lesions?: No Itching?: No  Eyes Blurred vision?: No Double vision?: No  Ears/Nose/Throat Sore throat?: No Sinus problems?: No  Hematologic/Lymphatic Swollen glands?: No Easy bruising?: Yes  Cardiovascular Leg swelling?: No Chest pain?: No  Respiratory Cough?: No Shortness of breath?: No  Endocrine Excessive thirst?: No  Musculoskeletal Back pain?: No Joint pain?: Yes  Neurological Headaches?: No Dizziness?: No  Psychologic Depression?: Yes Anxiety?: Yes  Physical Exam: BP 93/61   Pulse 66   Wt 165 lb (74.8 kg)   BMI 26.63 kg/m   Constitutional:  Alert and oriented, No acute distress.  Accompanied by wife today.  In wheelchair. HEENT: [place] AT, moist mucus membranes.  Trachea midline, no masses. Cardiovascular: No clubbing, cyanosis, or edema. Respiratory: Normal respiratory effort, no increased work of breathing. Skin: No rashes, bruises or suspicious lesions. Neurologic: Grossly intact, no focal deficits, moving all 4 extremities.  Upper extremity tremor noted.  Ambulating slowly. Psychiatric: Normal mood and affect.  Laboratory Data: Lab Results  Component Value Date   WBC 3.7 02/21/2017   HGB 12.3 (L) 02/21/2017   HCT 36.2 (L) 02/21/2017   MCV 87 02/21/2017   PLT 176 02/21/2017    Lab Results  Component Value Date   CREATININE 0.82 02/21/2017     Urinalysis/ Pertinent Imaging: Results for orders placed or performed in visit on 04/12/17  BLADDER SCAN AMB NON-IMAGING  Result Value Ref Range   Scan Result 2ml    UA reviewed today, negative other than cousins of calcium oxalate crystals. No blood or white blood cells.  Assessment  & Plan:    1. History of bladder stone S/p cystolitholapaxy Calcium oxalate crystals in urine today but remains asymptomatic Discuss risk for recurrent stones, although should be lower status post recent outlet procedure - Urinalysis, Complete  2. Benign prostatic hyperplasia with urinary obstruction S/p TURP PVR excellent today Recommend cessation of Flomax given his minimal urinary symptoms, may resume if symptoms recur - BLADDER SCAN AMB NON-IMAGING  3. Recurrent UTI History of recurrent UTIs prior to surgery  Currently asymptomatic with negative UA today  Given that he is doing extremely well, he may follow-up as needed at this point. We did discuss extensively signs or symptoms of recurrent inability to empty, UTIs,  bladder stones, and worsening of his urinary symptoms as triggers for reassessment.  Hollice Espy, MD   [facility] [address] [place], [place], [place] [address] [phone]

## 2017-04-13 ENCOUNTER — Ambulatory Visit: Payer: Medicare Other | Admitting: Occupational Therapy

## 2017-04-13 DIAGNOSIS — R262 Difficulty in walking, not elsewhere classified: Secondary | ICD-10-CM | POA: Diagnosis not present

## 2017-04-13 DIAGNOSIS — M25511 Pain in right shoulder: Secondary | ICD-10-CM

## 2017-04-13 DIAGNOSIS — R278 Other lack of coordination: Secondary | ICD-10-CM

## 2017-04-13 DIAGNOSIS — R293 Abnormal posture: Secondary | ICD-10-CM

## 2017-04-13 DIAGNOSIS — R2681 Unsteadiness on feet: Secondary | ICD-10-CM

## 2017-04-13 DIAGNOSIS — G8929 Other chronic pain: Secondary | ICD-10-CM

## 2017-04-13 DIAGNOSIS — M6281 Muscle weakness (generalized): Secondary | ICD-10-CM

## 2017-04-14 ENCOUNTER — Encounter: Payer: Self-pay | Admitting: Occupational Therapy

## 2017-04-14 NOTE — Therapy (Signed)
[facility] MA[facility] REHAB SERVICES [address] [place], [place], [address] Phone: [phone]   Fax:  [phone]  Occupational Therapy Treatment  Patient Details  Name: Luke Durham MRN: [id number] Date of Birth: 09/16/1933 Referring Provider: [place]  Encounter Date: 04/13/2017      OT End of Session - 04/14/17 1438    Visit Number 34   Number of Visits 53   Date for OT Re-Evaluation 06/01/17   Authorization Type Medicare visit 34   OT Start Time 0956   OT Stop Time 1100   OT Time Calculation (min) 64 min   Activity Tolerance Patient tolerated treatment well   Behavior During Therapy WFL for tasks assessed/performed      Past Medical History:  Diagnosis Date  . Anxiety   . Arthritis   . Asthma    childhood asthma  . Cancer (HCC) 7 years ago   lymphoma, [facility] (recent)  . Chronic kidney disease   . Colon polyps   . GERD (gastroesophageal reflux disease)   . History of kidney stones   . Parkinson disease (HCC)     Past Surgical History:  Procedure Laterality Date  . COLON SURGERY    . CYSTOSCOPY WITH LITHOLAPAXY N/A 11/14/2016   Procedure: CYSTOSCOPY WITH LITHOLAPAXY;  Surgeon: Ashley Brandon, MD;  Location: [facility] ORS;  Service: Urology;  Laterality: N/A;  . EYE SURGERY Bilateral    Cataract Extraction with IOL  . HERNIA REPAIR  20 years  . MOHS SURGERY    . SMALL INTESTINE SURGERY     Per patient 7 years  . TRANSURETHRAL RESECTION OF PROSTATE N/A 11/14/2016   Procedure: TRANSURETHRAL RESECTION OF THE PROSTATE (TURP);  Surgeon: Ashley Brandon, MD;  Location: [facility] ORS;  Service: Urology;  Laterality: N/A;    There were no vitals filed for this visit.      Subjective Assessment - 04/14/17 1430    Subjective  Patient reports his medications were adjusted a bit especially to lengthen the window for the carpidopa levadopa which may help with his symptoms of increased freezing.  Patient reports he has performed sit to stand  exercise more than any other which could produce some quad soreness and denies that he reaches down to pet the new puppy.  He is still interested in a rollator however, therapist spoke with patient and wife about waiting to trying swivel wheels on walker to see if this will help.  Understands the concerns of the opposing brake systems between U version walker and rollator.    Patient is accompained by: Family member   Pertinent History Patient reports he was diagnosed with Parkinson's disease about 10 plus years ago.  He reports a more recent decline in function in his daily activities in the last 6-12 months.  He has been seeing PT for the last couple months.   Patient Stated Goals Patient reports he wants to be able to do more for himself and around the house.    Currently in Pain? No/denies   Pain Score 0-No pain   Aggravating Factors  no current pain but reports his quads have been painful at times over the last week.    Multiple Pain Sites No                      OT Treatments/Exercises (OP) - 04/14/17 1440      ADLs   ADL Comments Patient seen for navigating in small spaces to access toilet, turning in   closet when retrieving clothing with moderate verbal cues.  Patient is getting up at night and not using walker to go to the bathroom and is having significant freezing of gait, placing him at risk for falls.  Recommend he use the walker at night for safety but also try laser feature on the U version walker to see if this helps minimize freezing.  Patient and wife demonstrate understanding.      Neurological Re-education Exercises   Other Exercises 1 Patient seen for instruction of LSVT BIG exercises: LSVT Daily Session Maximal Daily Exercises: Sustained movements are designed to rescale the amplitude of movement output for generalization to daily functional activities. Performed as follows for 1 set of 10 repetitions each: Multi directional sustained movements- 1) Floor to  ceiling, 2) Side to side. Multi directional Repetitive movements performed in standing and are designed to provide retraining effort needed for sustained muscle activation in tasks Performed as follows: 3) Step and reach forward, 4) Step and Reach Backwards, 5) Step and reach sideways, 6) Rock and reach forward/backward, 7) Rock and reach sideways. Performed with adapted technique with use of chair for all exercises in standing, therapist cues in order to help guide and direct patient at home for home program. Patient required CGA assist for all exercises in standing, moderate verbal and tactile cues provided. Patient continues to demonstrate some problems with identifying the correct arm and leg movements for stepping forwards and stepping to the side. Patient seen for focus on navigating in small spaces with walker to address freezing of gait behaviors especially for reported nighttime toileting and retrieving clothing from the closet. Reciprocal toe tapping at staircase for 10 repetitions each side with SBA assist and verbal cues. Patient negotiating 5 steps with SBA and cues with patient performing quarter turns with moderate verbal cues, therapist demo and SBA at the stair landing.    Other Exercises 2 Patient seen for functional mobility skiils for one trial of 600 feet with  use of rolling walker, cues for step length and techniques to minimize freezing of gait behaviors especially with gait initiation and turning.                  OT Education - 04/14/17 1437    Education provided Yes   Education Details opposing brake systems on walkers, safety, HEP   Person(s) Educated Patient   Methods Explanation;Demonstration;Verbal cues   Comprehension Verbal cues required;Returned demonstration;Verbalized understanding             OT Long Term Goals - 03/24/17 1523      OT LONG TERM GOAL #1   Title Patient will demonstrate increase right hand grip to be able to cut food with modified  independence.    Time 12   Period Weeks   Status Partially Met     OT LONG TERM GOAL #2   Title Patient will complete donning long sleeve shirt with modified independence.   Baseline requires assist with long sleeves, can perform short sleeves.   Time 12   Period Weeks   Status Partially Met     OT LONG TERM GOAL #3   Title Patient will improve coordination to manage buttons on clothing with occasional assistance only.   Baseline requires assist with buttons each time   Time 12   Period Weeks   Status Partially Met     OT LONG TERM GOAL #4   Title Patient will complete managing pants after toileting with modified independence.  Baseline assist most days    Time 12   Period Weeks   Status On-going     OT LONG TERM GOAL #5   Title Patient will improve hand strength bilaterally to be able to don socks with modified independence.    Baseline unable at evaluation   Time 4   Period Weeks   Status Achieved     OT LONG TERM GOAL #6   Title Patient will improve gait speed and endurance and be able to walk 1050 feet in 6 minutes to negotiate around the home and community safely in 4 weeks   Baseline 920 feet prior to LSVT BIG, 1195 feet at 16th visit   Time 4   Period Weeks   Status Achieved     OT LONG TERM GOAL #7   Title Patient will complete HEP for maximal daily exercises with modified independence in 4 weeks   Time 12   Period Weeks   Status Partially Met     OT LONG TERM GOAL #8   Title Patient will transfer from sit to stand without the use of arms safely and independently from a variety of chairs/surfaces in 4 weeks.    Baseline difficulty from lower surfaces   Time 4   Period Weeks   Status Achieved     OT LONG TERM GOAL  #9   Baseline Patient will demonstrate decreased episodes of freezing of behaviors from score of 16 to score of less than 12.   Time 4   Period Weeks   Status Achieved               Plan - 04/14/17 1438    Clinical Impression  Statement Patient continues to progress in all areas, he demonstrates difficulty with using his RW for turns in the clinic and states it tends to always go to the right side.  May try swivel wheels and see if this will help with navigation when he is out in the community.  Recommend he use his walker at night when toileting and possibly try the laser feature on his U version to see if this helps with nighttime freezing.  Continue to work towards goals in plan of care.    Rehab Potential Good   Current Impairments/barriers affecting progress: positive: family support, negative: level of motivation, progressive disease   OT Frequency 2x / week   OT Duration 12 weeks   OT Treatment/Interventions Self-care/ADL training;Moist Heat;DME and/or AE instruction;Patient/family education;Therapeutic exercises;Balance training;Therapeutic exercise;Therapeutic activities;Neuromuscular education;Functional Mobility Training;Manual Therapy   Consulted and Agree with Plan of Care Patient;Family member/caregiver   Family Member Consulted wife      Patient will benefit from skilled therapeutic intervention in order to improve the following deficits and impairments:  Abnormal gait, Decreased coordination, Decreased range of motion, Difficulty walking, Decreased endurance, Decreased safety awareness, Decreased activity tolerance, Decreased balance, Impaired UE functional use, Pain, Decreased mobility, Decreased strength  Visit Diagnosis: Difficulty in walking, not elsewhere classified  Unsteadiness on feet  Muscle weakness (generalized)  Other lack of coordination  Abnormal posture  Chronic right shoulder pain    Problem List Patient Active Problem List   Diagnosis Date Noted  . BPH with urinary obstruction 11/14/2016  . Urinary frequency 10/10/2016  . Microscopic hematuria 10/10/2016  . Parkinson's syndrome (HCC) 06/14/2016  . Arthritis 06/14/2016  . Depression 06/14/2016  . Heartburn 06/14/2016  .  Urinary incontinence 06/14/2016  . Skin lesion 06/14/2016  . Dementia 06/14/2016  . Bilateral edema of   lower extremity 06/14/2016   Amy T Lovett, OTR/L, CLT  Lovett,Amy 04/14/2017, 3:46 PM  [facility] [address] [place], [place], [address] Phone: [phone]   Fax:  [phone]  Name: Luke Durham MRN: [id number] Date of Birth: 12/25/1932 

## 2017-04-17 ENCOUNTER — Ambulatory Visit: Payer: Medicare Other | Admitting: Occupational Therapy

## 2017-04-18 ENCOUNTER — Ambulatory Visit: Payer: Medicare Other | Admitting: Occupational Therapy

## 2017-04-18 DIAGNOSIS — R278 Other lack of coordination: Secondary | ICD-10-CM

## 2017-04-18 DIAGNOSIS — R262 Difficulty in walking, not elsewhere classified: Secondary | ICD-10-CM

## 2017-04-18 DIAGNOSIS — R293 Abnormal posture: Secondary | ICD-10-CM

## 2017-04-18 DIAGNOSIS — R2681 Unsteadiness on feet: Secondary | ICD-10-CM

## 2017-04-18 DIAGNOSIS — M6281 Muscle weakness (generalized): Secondary | ICD-10-CM

## 2017-04-19 ENCOUNTER — Encounter: Payer: Self-pay | Admitting: Occupational Therapy

## 2017-04-19 NOTE — Therapy (Signed)
[place] MAIN [facility] SERVICES [address] [place], [place], [address] Phone: [phone]   Fax:  [phone]  Occupational Therapy Treatment  Patient Details  Name: Luke Durham MRN: [id number] Date of Birth: [birth year] Referring Provider: Marlyn Corporal  Encounter Date: 04/18/2017      OT End of Session - 04/19/17 0942    Visit Number 35   Number of Visits 18   Date for OT Re-Evaluation 06/01/17   Authorization Type Medicare visit 43   OT Start Time 1500   OT Stop Time 1605   OT Time Calculation (min) 65 min   Activity Tolerance Patient tolerated treatment well   Behavior During Therapy [facility] for tasks assessed/performed      Past Medical History:  Diagnosis Date  . Anxiety   . Arthritis   . Asthma    childhood asthma  . Cancer ([place]) 7 years ago   lymphoma, [place] (recent)  . Chronic kidney disease   . Colon polyps   . GERD (gastroesophageal reflux disease)   . History of kidney stones   . Parkinson disease [facility])     Past Surgical History:  Procedure Laterality Date  . COLON SURGERY    . CYSTOSCOPY WITH LITHOLAPAXY N/A 11/14/2016   Procedure: CYSTOSCOPY WITH LITHOLAPAXY;  Surgeon: Hollice Espy, MD;  Location: [facility] ORS;  Service: Urology;  Laterality: N/A;  . EYE SURGERY Bilateral    Cataract Extraction with IOL  . HERNIA REPAIR  20 years  . MOHS SURGERY    . SMALL INTESTINE SURGERY     Per patient 7 years  . TRANSURETHRAL RESECTION OF PROSTATE N/A 11/14/2016   Procedure: TRANSURETHRAL RESECTION OF THE PROSTATE (TURP);  Surgeon: Hollice Espy, MD;  Location: [facility] ORS;  Service: Urology;  Laterality: N/A;    There were no vitals filed for this visit.      Subjective Assessment - 04/19/17 0941    Subjective  Patient reports he still is having trouble with freezing especially at night when trying to get up to go to the bathroom.   Pertinent History Patient reports he was diagnosed with Parkinson's disease about 10 plus  years ago.  He reports a more recent decline in function in his daily activities in the last 6-12 months.  He has been seeing PT for the last couple months.   Patient Stated Goals Patient reports he wants to be able to do more for himself and around the house.    Currently in Pain? No/denies                      OT Treatments/Exercises (OP) - 04/19/17 1711      Neurological Re-education Exercises   Other Exercises 1 Patient seen for instruction of LSVT BIG exercises: LSVT Daily Session Maximal Daily Exercises: Sustained movements are designed to rescale the amplitude of movement output for generalization to daily functional activities. Performed as follows for 1 set of 10 repetitions each: Multi directional sustained movements- 1) Floor to ceiling, 2) Side to side. Multi directional Repetitive movements performed in standing and are designed to provide retraining effort needed for sustained muscle activation in tasks Performed as follows: 3) Step and reach forward, 4) Step and Reach Backwards, 5) Step and reach sideways, 6) Rock and reach forward/backward, 7) Rock and reach sideways. Performed with adapted technique with use of chair for all exercises in standing, therapist cues in order to help guide and direct patient at home for home program. Patient  required CGA assist for all exercises in standing, moderate verbal and tactile cues provided.   Other Exercises 2 Reciprocal toe tapping at staircase for 10 repetitions each side with SBA assist and verbal cues. Patient negotiating 5 steps with SBA and cues with patient performing quarter turns with moderate verbal cues, therapist demo and SBA at the stair landing.  Posture exercises at the wall.  See below in assessment for tasks with simulated mobility from bed to bathroom.                 OT Education - 04/19/17 0941    Education provided Yes   Education Details functional mobility with night time toileting, freezing behaviors    Person(s) Educated Patient;Spouse   Methods Explanation;Verbal cues;Demonstration   Comprehension Verbalized understanding;Returned demonstration;Tactile cues required             OT Long Term Goals - 03/24/17 1523      OT LONG TERM GOAL #1   Title Patient will demonstrate increase right hand grip to be able to cut food with modified independence.    Time 12   Period Weeks   Status Partially Met     OT LONG TERM GOAL #2   Title Patient will complete donning long sleeve shirt with modified independence.   Baseline requires assist with long sleeves, can perform short sleeves.   Time 12   Period Weeks   Status Partially Met     OT LONG TERM GOAL #3   Title Patient will improve coordination to manage buttons on clothing with occasional assistance only.   Baseline requires assist with buttons each time   Time 12   Period Weeks   Status Partially Met     OT LONG TERM GOAL #4   Title Patient will complete managing pants after toileting with modified independence.    Baseline assist most days    Time 12   Period Weeks   Status On-going     OT LONG TERM GOAL #5   Title Patient will improve hand strength bilaterally to be able to don socks with modified independence.    Baseline unable at evaluation   Time 4   Period Weeks   Status Achieved     OT LONG TERM GOAL #6   Title Patient will improve gait speed and endurance and be able to walk 1050 feet in 6 minutes to negotiate around the home and community safely in 4 weeks   Baseline 920 feet prior to LSVT BIG, 1195 feet at 16th visit   Time 4   Period Weeks   Status Achieved     OT LONG TERM GOAL #7   Title Patient will complete HEP for maximal daily exercises with modified independence in 4 weeks   Time 12   Period Weeks   Status Partially Met     OT LONG TERM GOAL #8   Title Patient will transfer from sit to stand without the use of arms safely and independently from a variety of chairs/surfaces in 4 weeks.     Baseline difficulty from lower surfaces   Time 4   Period Weeks   Status Achieved     OT LONG TERM GOAL  #9   Baseline Patient will demonstrate decreased episodes of freezing of behaviors from score of 16 to score of less than 12.   Time 4   Period Weeks   Status Achieved  Plan - 04/19/17 0942    Clinical Impression Statement Patient seen this date for focus on functional mobility going to the bathroom at night. Patient has a small light on in the bedrooom and then carries a flashlight with him to the bathroom which is states is just a few feet away from the bed. He does not currently use the walker and reports increased freezing behaviors. In the clinic, he tends to freeze more when he doesnt have anything to hold onto or weight bear through when moving. Multiple trials of using the walker in a simulated set up from bed to bathroom and use of flashlight as a target to increase his steps if he freezes. Recommend he use an assistive device at night for ambulation to the bathroom. He demonstrates understanding.    Rehab Potential Good   Current Impairments/barriers affecting progress: positive: family support, negative: level of motivation, progressive disease   OT Frequency 2x / week   OT Duration 12 weeks   OT Treatment/Interventions Self-care/ADL training;Moist Heat;DME and/or AE instruction;Patient/family education;Therapeutic exercises;Balance training;Therapeutic exercise;Therapeutic activities;Neuromuscular education;Functional Mobility Training;Manual Therapy   Consulted and Agree with Plan of Care Patient;Family member/caregiver   Family Member Consulted wife      Patient will benefit from skilled therapeutic intervention in order to improve the following deficits and impairments:  Abnormal gait, Decreased coordination, Decreased range of motion, Difficulty walking, Decreased endurance, Decreased safety awareness, Decreased activity tolerance, Decreased balance,  Impaired UE functional use, Pain, Decreased mobility, Decreased strength  Visit Diagnosis: Difficulty in walking, not elsewhere classified  Unsteadiness on feet  Muscle weakness (generalized)  Other lack of coordination  Abnormal posture    Problem List Patient Active Problem List   Diagnosis Date Noted  . BPH with urinary obstruction 11/14/2016  . Urinary frequency 10/10/2016  . Microscopic hematuria 10/10/2016  . Parkinson's syndrome ([place]) 06/14/2016  . Arthritis 06/14/2016  . Depression 06/14/2016  . Heartburn 06/14/2016  . Urinary incontinence 06/14/2016  . Skin lesion 06/14/2016  . Dementia 06/14/2016  . Bilateral edema of lower extremity 06/14/2016   Achilles Dunk, OTR/L, CLT  Lovett,Amy 04/19/2017, 5:14 PM  [place] MAIN [facility] SERVICES [address] [place], [place], [address] Phone: [phone]   Fax:  [phone]  Name: Luke Durham MRN: [id number] Date of Birth: [birth year]

## 2017-04-20 ENCOUNTER — Ambulatory Visit: Payer: Medicare Other | Admitting: Occupational Therapy

## 2017-04-20 ENCOUNTER — Encounter: Payer: Self-pay | Admitting: Occupational Therapy

## 2017-04-20 DIAGNOSIS — M6281 Muscle weakness (generalized): Secondary | ICD-10-CM

## 2017-04-20 DIAGNOSIS — R262 Difficulty in walking, not elsewhere classified: Secondary | ICD-10-CM | POA: Diagnosis not present

## 2017-04-20 DIAGNOSIS — R278 Other lack of coordination: Secondary | ICD-10-CM

## 2017-04-20 DIAGNOSIS — R293 Abnormal posture: Secondary | ICD-10-CM

## 2017-04-20 DIAGNOSIS — R2681 Unsteadiness on feet: Secondary | ICD-10-CM

## 2017-04-21 NOTE — Therapy (Signed)
[place] MAIN [facility] SERVICES [address] [place], [place], [address] Phone: [phone]   Fax:  [phone]  Occupational Therapy Treatment  Patient Details  Name: Luke Durham MRN: [id number] Date of Birth: [birth year] Referring Provider: Marlyn Corporal  Encounter Date: 04/20/2017      OT End of Session - 04/20/17 1139    Visit Number 36   Number of Visits 39   Date for OT Re-Evaluation 06/01/17   Authorization Type Medicare visit 43   OT Start Time 1000   OT Stop Time 1102   OT Time Calculation (min) 62 min   Activity Tolerance Patient tolerated treatment well   Behavior During Therapy [facility] for tasks assessed/performed      Past Medical History:  Diagnosis Date  . Anxiety   . Arthritis   . Asthma    childhood asthma  . Cancer ([place]) 7 years ago   lymphoma, [place] (recent)  . Chronic kidney disease   . Colon polyps   . GERD (gastroesophageal reflux disease)   . History of kidney stones   . Parkinson disease [facility])     Past Surgical History:  Procedure Laterality Date  . COLON SURGERY    . CYSTOSCOPY WITH LITHOLAPAXY N/A 11/14/2016   Procedure: CYSTOSCOPY WITH LITHOLAPAXY;  Surgeon: Hollice Espy, MD;  Location: [facility] ORS;  Service: Urology;  Laterality: N/A;  . EYE SURGERY Bilateral    Cataract Extraction with IOL  . HERNIA REPAIR  20 years  . MOHS SURGERY    . SMALL INTESTINE SURGERY     Per patient 7 years  . TRANSURETHRAL RESECTION OF PROSTATE N/A 11/14/2016   Procedure: TRANSURETHRAL RESECTION OF THE PROSTATE (TURP);  Surgeon: Hollice Espy, MD;  Location: [facility] ORS;  Service: Urology;  Laterality: N/A;    There were no vitals filed for this visit.      Subjective Assessment - 04/20/17 1132    Subjective  Patient states, "I really want to get a new walker, one that is easier to use."  Patient reports the last 2 nights getting up and going to the bathroom have improved with decreased freezing of gait present and he did  not soil himself either night.     Pertinent History Patient reports he was diagnosed with Parkinson's disease about 10 plus years ago.  He reports a more recent decline in function in his daily activities in the last 6-12 months.  He has been seeing PT for the last couple months.   Patient Stated Goals Patient reports he wants to be able to do more for himself and around the house.    Currently in Pain? No/denies   Pain Score 0-No pain                      OT Treatments/Exercises (OP) - 04/20/17 1134      ADLs   ADL Comments Continued to work towards bathroom mobility for nighttime toileting with cues and use of assisitve device, decreased freezing and hesitations this date. Crossed leg method for tying shoes with cues for leg position.      Neurological Re-education Exercises   Other Exercises 1 Patient seen for instruction of LSVT BIG exercises: LSVT Daily Session Maximal Daily Exercises: Sustained movements are designed to rescale the amplitude of movement output for generalization to daily functional activities. Performed as follows for 1 set of 10 repetitions each: Multi directional sustained movements- 1) Floor to ceiling, 2) Side to side. Multi directional Repetitive  movements performed in standing and are designed to provide retraining effort needed for sustained muscle activation in tasks Performed as follows: 3) Step and reach forward, 4) Step and Reach Backwards, 5) Step and reach sideways, 6) Rock and reach forward/backward, 7) Rock and reach sideways. Performed with adapted technique with use of chair for all exercises in standing, therapist cues in order to help guide and direct patient at home for home program. Patient required CGA assist for all exercises in standing, minimal verbal and tactile cues provided.   Other Exercises 2 Focused on posture exercises in standing with use of ball for overhead reach, toss and catch as well as bounce catch with SBA.  Patient requires  cues for head position and shoulders for more upright posture, cues to maintain during task.  Stair negotiation with emphasis on amplitude of turns at the top of the staircase and positioning of feet and body when starting to descend steps. Functional mobility for 50 feet X 2 sets pushing transport chair with cues and SBA for safety.                  OT Education - 04/20/17 1139    Education provided Yes   Education Details options of walkers, freezing, toileting   Person(s) Educated Patient   Methods Explanation;Demonstration;Verbal cues   Comprehension Verbal cues required;Returned demonstration;Verbalized understanding             OT Long Term Goals - 03/24/17 1523      OT LONG TERM GOAL #1   Title Patient will demonstrate increase right hand grip to be able to cut food with modified independence.    Time 12   Period Weeks   Status Partially Met     OT LONG TERM GOAL #2   Title Patient will complete donning long sleeve shirt with modified independence.   Baseline requires assist with long sleeves, can perform short sleeves.   Time 12   Period Weeks   Status Partially Met     OT LONG TERM GOAL #3   Title Patient will improve coordination to manage buttons on clothing with occasional assistance only.   Baseline requires assist with buttons each time   Time 12   Period Weeks   Status Partially Met     OT LONG TERM GOAL #4   Title Patient will complete managing pants after toileting with modified independence.    Baseline assist most days    Time 12   Period Weeks   Status On-going     OT LONG TERM GOAL #5   Title Patient will improve hand strength bilaterally to be able to don socks with modified independence.    Baseline unable at evaluation   Time 4   Period Weeks   Status Achieved     OT LONG TERM GOAL #6   Title Patient will improve gait speed and endurance and be able to walk 1050 feet in 6 minutes to negotiate around the home and community safely  in 4 weeks   Baseline 920 feet prior to LSVT BIG, 1195 feet at 16th visit   Time 4   Period Weeks   Status Achieved     OT LONG TERM GOAL #7   Title Patient will complete HEP for maximal daily exercises with modified independence in 4 weeks   Time 12   Period Weeks   Status Partially Met     OT LONG TERM GOAL #8   Title Patient will transfer from  sit to stand without the use of arms safely and independently from a variety of chairs/surfaces in 4 weeks.    Baseline difficulty from lower surfaces   Time 4   Period Weeks   Status Achieved     OT LONG TERM GOAL  #9   Baseline Patient will demonstrate decreased episodes of freezing of behaviors from score of 16 to score of less than 12.   Time 4   Period Weeks   Status Achieved               Plan - 04/20/17 1140    Clinical Impression Statement Patient continues to progress with exercises and functional mobility skills.  He demonstrated decreased freezing of gait at night over the past couple of nights.  Continued focus on posture to help impact balance and safety with mobility.     Rehab Potential Good   Current Impairments/barriers affecting progress: positive: family support, negative: level of motivation, progressive disease   OT Frequency 2x / week   OT Duration 12 weeks   OT Treatment/Interventions Self-care/ADL training;Moist Heat;DME and/or AE instruction;Patient/family education;Therapeutic exercises;Balance training;Therapeutic exercise;Therapeutic activities;Neuromuscular education;Functional Mobility Training;Manual Therapy   Consulted and Agree with Plan of Care Patient;Family member/caregiver   Family Member Consulted wife      Patient will benefit from skilled therapeutic intervention in order to improve the following deficits and impairments:  Abnormal gait, Decreased coordination, Decreased range of motion, Difficulty walking, Decreased endurance, Decreased safety awareness, Decreased activity tolerance,  Decreased balance, Impaired UE functional use, Pain, Decreased mobility, Decreased strength  Visit Diagnosis: Difficulty in walking, not elsewhere classified  Unsteadiness on feet  Muscle weakness (generalized)  Other lack of coordination  Abnormal posture    Problem List Patient Active Problem List   Diagnosis Date Noted  . BPH with urinary obstruction 11/14/2016  . Urinary frequency 10/10/2016  . Microscopic hematuria 10/10/2016  . Parkinson's syndrome ([place]) 06/14/2016  . Arthritis 06/14/2016  . Depression 06/14/2016  . Heartburn 06/14/2016  . Urinary incontinence 06/14/2016  . Skin lesion 06/14/2016  . Dementia 06/14/2016  . Bilateral edema of lower extremity 06/14/2016   Achilles Dunk, OTR/L, CLT  Lovett,Amy 04/21/2017, 7:02 PM  [place] MAIN [facility] SERVICES [address] [place], [place], [address] Phone: [phone]   Fax:  [phone]  Name: Luke Durham MRN: [id number] Date of Birth: 04-15-1933

## 2017-04-24 ENCOUNTER — Ambulatory Visit: Payer: Medicare Other | Attending: Neurology | Admitting: Occupational Therapy

## 2017-04-24 DIAGNOSIS — M25511 Pain in right shoulder: Secondary | ICD-10-CM | POA: Diagnosis present

## 2017-04-24 DIAGNOSIS — R262 Difficulty in walking, not elsewhere classified: Secondary | ICD-10-CM | POA: Diagnosis not present

## 2017-04-24 DIAGNOSIS — R278 Other lack of coordination: Secondary | ICD-10-CM

## 2017-04-24 DIAGNOSIS — R293 Abnormal posture: Secondary | ICD-10-CM

## 2017-04-24 DIAGNOSIS — G8929 Other chronic pain: Secondary | ICD-10-CM

## 2017-04-24 DIAGNOSIS — R2681 Unsteadiness on feet: Secondary | ICD-10-CM | POA: Diagnosis present

## 2017-04-24 DIAGNOSIS — M6281 Muscle weakness (generalized): Secondary | ICD-10-CM | POA: Diagnosis present

## 2017-04-27 ENCOUNTER — Ambulatory Visit: Payer: Medicare Other | Admitting: Occupational Therapy

## 2017-04-27 DIAGNOSIS — R2681 Unsteadiness on feet: Secondary | ICD-10-CM

## 2017-04-27 DIAGNOSIS — R278 Other lack of coordination: Secondary | ICD-10-CM

## 2017-04-27 DIAGNOSIS — R262 Difficulty in walking, not elsewhere classified: Secondary | ICD-10-CM

## 2017-04-27 DIAGNOSIS — M6281 Muscle weakness (generalized): Secondary | ICD-10-CM

## 2017-04-27 DIAGNOSIS — G8929 Other chronic pain: Secondary | ICD-10-CM

## 2017-04-27 DIAGNOSIS — M25511 Pain in right shoulder: Secondary | ICD-10-CM

## 2017-04-27 DIAGNOSIS — R293 Abnormal posture: Secondary | ICD-10-CM

## 2017-04-29 ENCOUNTER — Encounter: Payer: Self-pay | Admitting: Occupational Therapy

## 2017-04-29 NOTE — Therapy (Signed)
[place] MAIN [facility] SERVICES [address] [place], [place], [address] Phone: [phone]   Fax:  [phone]  Occupational Therapy Treatment  Patient Details  Name: Luke Durham MRN: [id number] Date of Birth: 1933/02/13 Referring Provider: Marlyn Corporal  Encounter Date: 04/24/2017      OT End of Session - 04/29/17 1900    Visit Number 37   Number of Visits 77   Date for OT Re-Evaluation 06/01/17   Authorization Type Medicare visit 23   OT Start Time 1302   OT Stop Time 1405   OT Time Calculation (min) 63 min   Activity Tolerance Patient tolerated treatment well   Behavior During Therapy [facility] for tasks assessed/performed      Past Medical History:  Diagnosis Date  . Anxiety   . Arthritis   . Asthma    childhood asthma  . Cancer ([place]) 7 years ago   lymphoma, [place] (recent)  . Chronic kidney disease   . Colon polyps   . GERD (gastroesophageal reflux disease)   . History of kidney stones   . Parkinson disease [facility])     Past Surgical History:  Procedure Laterality Date  . COLON SURGERY    . CYSTOSCOPY WITH LITHOLAPAXY N/A 11/14/2016   Procedure: CYSTOSCOPY WITH LITHOLAPAXY;  Surgeon: Hollice Espy, MD;  Location: [facility] ORS;  Service: Urology;  Laterality: N/A;  . EYE SURGERY Bilateral    Cataract Extraction with IOL  . HERNIA REPAIR  20 years  . MOHS SURGERY    . SMALL INTESTINE SURGERY     Per patient 7 years  . TRANSURETHRAL RESECTION OF PROSTATE N/A 11/14/2016   Procedure: TRANSURETHRAL RESECTION OF THE PROSTATE (TURP);  Surgeon: Hollice Espy, MD;  Location: [facility] ORS;  Service: Urology;  Laterality: N/A;    There were no vitals filed for this visit.      Subjective Assessment - 04/29/17 1856    Subjective  Patient reports he  would like to try an [place] walker which has swivel wheels, lightweight and portable.    Pertinent History Patient reports he was diagnosed with Parkinson's disease about 10 plus years ago.  He  reports a more recent decline in function in his daily activities in the last 6-12 months.  He has been seeing PT for the last couple months.   Patient Stated Goals Patient reports he wants to be able to do more for himself and around the house.    Currently in Pain? No/denies   Pain Score 0-No pain                      OT Treatments/Exercises (OP) - 04/29/17 1857      ADLs   ADL Comments Patient seen for moving in and out of small spaces with cues for techniques to decrease freezing of gait patterns, stair negotiation 5 steps up and down with focus on turns with larger amplitude of steps, reciprocal toe tapping with SBA.      Neurological Re-education Exercises   Other Exercises 1 Patient seen for instruction of LSVT BIG exercises: LSVT Daily Session Maximal Daily Exercises: Sustained movements are designed to rescale the amplitude of movement output for generalization to daily functional activities. Performed as follows for 1 set of 10 repetitions each: Multi directional sustained movements- 1) Floor to ceiling, 2) Side to side. Multi directional Repetitive movements performed in standing and are designed to provide retraining effort needed for sustained muscle activation in tasks Performed as  follows: 3) Step and reach forward, 4) Step and Reach Backwards, 5) Step and reach sideways, 6) Rock and reach forward/backward, 7) Rock and reach sideways. Performed with adapted technique with use of chair for all exercises in standing, therapist cues in order to help guide and direct patient at home for home program. Patient required CGA assist for all exercises in standing, minimal verbal and tactile cues provided.   Other Exercises 2 Functional mobility for one trial of 350 feet with rolling walker and cues for turns, navigating in crowded spaces.                 OT Education - 04/29/17 1900    Education provided Yes   Education Details crowded spaces, small space negotiation.    Person(s) Educated Patient   Methods Explanation;Demonstration;Verbal cues   Comprehension Verbal cues required;Returned demonstration;Verbalized understanding             OT Long Term Goals - 03/24/17 1523      OT LONG TERM GOAL #1   Title Patient will demonstrate increase right hand grip to be able to cut food with modified independence.    Time 12   Period Weeks   Status Partially Met     OT LONG TERM GOAL #2   Title Patient will complete donning long sleeve shirt with modified independence.   Baseline requires assist with long sleeves, can perform short sleeves.   Time 12   Period Weeks   Status Partially Met     OT LONG TERM GOAL #3   Title Patient will improve coordination to manage buttons on clothing with occasional assistance only.   Baseline requires assist with buttons each time   Time 12   Period Weeks   Status Partially Met     OT LONG TERM GOAL #4   Title Patient will complete managing pants after toileting with modified independence.    Baseline assist most days    Time 12   Period Weeks   Status On-going     OT LONG TERM GOAL #5   Title Patient will improve hand strength bilaterally to be able to don socks with modified independence.    Baseline unable at evaluation   Time 4   Period Weeks   Status Achieved     OT LONG TERM GOAL #6   Title Patient will improve gait speed and endurance and be able to walk 1050 feet in 6 minutes to negotiate around the home and community safely in 4 weeks   Baseline 920 feet prior to LSVT BIG, 1195 feet at 16th visit   Time 4   Period Weeks   Status Achieved     OT LONG TERM GOAL #7   Title Patient will complete HEP for maximal daily exercises with modified independence in 4 weeks   Time 12   Period Weeks   Status Partially Met     OT LONG TERM GOAL #8   Title Patient will transfer from sit to stand without the use of arms safely and independently from a variety of chairs/surfaces in 4 weeks.    Baseline  difficulty from lower surfaces   Time 4   Period Weeks   Status Achieved     OT LONG TERM GOAL  #9   Baseline Patient will demonstrate decreased episodes of freezing of behaviors from score of 16 to score of less than 12.   Time 4   Period Weeks   Status Achieved  Plan - 04/29/17 1901    Clinical Impression Statement Patient reports he still has difficulty with freezing at home although his legs have not been cramping and hurting like they were a few weeks ago.  He continues to work on minimizing freezing of gait behaviors with focus on specific patterns such as navigating in small spaces, such as the bathroom and closet, crowded spaces and elevator.  Patient feels he does well in therapy but does not do as well at home but not sure why.  He states, "It always seems easier when you are here to tell me what to do. "   Rehab Potential Good   Current Impairments/barriers affecting progress: positive: family support, negative: level of motivation, progressive disease   OT Frequency 2x / week   OT Duration 12 weeks   OT Treatment/Interventions Self-care/ADL training;Moist Heat;DME and/or AE instruction;Patient/family education;Therapeutic exercises;Balance training;Therapeutic exercise;Therapeutic activities;Neuromuscular education;Functional Mobility Training;Manual Therapy   Consulted and Agree with Plan of Care Patient;Family member/caregiver   Family Member Consulted wife      Patient will benefit from skilled therapeutic intervention in order to improve the following deficits and impairments:  Abnormal gait, Decreased coordination, Decreased range of motion, Difficulty walking, Decreased endurance, Decreased safety awareness, Decreased activity tolerance, Decreased balance, Impaired UE functional use, Pain, Decreased mobility, Decreased strength  Visit Diagnosis: Difficulty in walking, not elsewhere classified  Unsteadiness on feet  Muscle weakness  (generalized)  Other lack of coordination  Abnormal posture  Chronic right shoulder pain    Problem List Patient Active Problem List   Diagnosis Date Noted  . BPH with urinary obstruction 11/14/2016  . Urinary frequency 10/10/2016  . Microscopic hematuria 10/10/2016  . Parkinson's syndrome ([place]) 06/14/2016  . Arthritis 06/14/2016  . Depression 06/14/2016  . Heartburn 06/14/2016  . Urinary incontinence 06/14/2016  . Skin lesion 06/14/2016  . Dementia 06/14/2016  . Bilateral edema of lower extremity 06/14/2016   Achilles Dunk, OTR/L, CLT  Lovett,Amy 04/29/2017, 7:04 PM  [place] MAIN [facility] SERVICES [address] [place], [place], [address] Phone: [phone]   Fax:  [phone]  Name: Luke Durham MRN: [id number] Date of Birth: 11/17/1932

## 2017-04-29 NOTE — Therapy (Signed)
[place] MAIN [facility] SERVICES [address] [place], [place], [address] Phone: [phone]   Fax:  [phone]  Occupational Therapy Treatment  Patient Details  Name: Luke Durham MRN: [id number] Date of Birth: 1933-03-26 Referring Provider: Marlyn Corporal  Encounter Date: 04/27/2017      OT End of Session - 04/29/17 2043    Visit Number 38   Number of Visits 48   Date for OT Re-Evaluation 06/01/17   Authorization Type Medicare visit 61   OT Start Time 1255   OT Stop Time 1400   OT Time Calculation (min) 65 min   Activity Tolerance Patient tolerated treatment well   Behavior During Therapy [facility] for tasks assessed/performed      Past Medical History:  Diagnosis Date  . Anxiety   . Arthritis   . Asthma    childhood asthma  . Cancer ([place]) 7 years ago   lymphoma, [place] (recent)  . Chronic kidney disease   . Colon polyps   . GERD (gastroesophageal reflux disease)   . History of kidney stones   . Parkinson disease [facility])     Past Surgical History:  Procedure Laterality Date  . COLON SURGERY    . CYSTOSCOPY WITH LITHOLAPAXY N/A 11/14/2016   Procedure: CYSTOSCOPY WITH LITHOLAPAXY;  Surgeon: Hollice Espy, MD;  Location: [facility] ORS;  Service: Urology;  Laterality: N/A;  . EYE SURGERY Bilateral    Cataract Extraction with IOL  . HERNIA REPAIR  20 years  . MOHS SURGERY    . SMALL INTESTINE SURGERY     Per patient 7 years  . TRANSURETHRAL RESECTION OF PROSTATE N/A 11/14/2016   Procedure: TRANSURETHRAL RESECTION OF THE PROSTATE (TURP);  Surgeon: Hollice Espy, MD;  Location: [facility] ORS;  Service: Urology;  Laterality: N/A;    There were no vitals filed for this visit.      Subjective Assessment - 04/29/17 2039    Subjective  Patient reports he is excited to try a different walker today to see if it will help him with his turns.     Pertinent History Patient reports he was diagnosed with Parkinson's disease about 10 plus years ago.  He  reports a more recent decline in function in his daily activities in the last 6-12 months.  He has been seeing PT for the last couple months.   Patient Stated Goals Patient reports he wants to be able to do more for himself and around the house.    Currently in Pain? No/denies   Pain Score 0-No pain                      OT Treatments/Exercises (OP) - 04/29/17 2039      Neurological Re-education Exercises   Other Exercises 1 Patient seen for instruction of LSVT BIG exercises: LSVT Daily Session Maximal Daily Exercises: Sustained movements are designed to rescale the amplitude of movement output for generalization to daily functional activities. Performed as follows for 1 set of 10 repetitions each: Multi directional sustained movements- 1) Floor to ceiling, 2) Side to side. Multi directional Repetitive movements performed in standing and are designed to provide retraining effort needed for sustained muscle activation in tasks Performed as follows: 3) Step and reach forward, 4) Step and Reach Backwards, 5) Step and reach sideways, 6) Rock and reach forward/backward, 7) Rock and reach sideways. Performed with adapted technique with use of chair for all exercises in standing, therapist cues in order to help guide and direct  patient at home for home program. Patient required CGA assist for all exercises in standing, minimal verbal and tactile cues provided.   Other Exercises 2 Functional mobility skills with EZ GO walker this date with the following features, wheels that can be either swivel or locked, easy fold for storage, lightweight only 7#, adjustable. Patient ambulating on various surfaces, linoleum and carpet and seen for multiple turns in large and small areas.  Patient did not have any episodes of freezing until one at the end which he required cues to bring walker back towards him versus trying to step forwards.  He will need additional exposure and instruction on use of this walker to  determine safety with freezing episodes.  Continue to focus on this in the next week.                 OT Education - 04/29/17 2043    Education provided Yes   Education Details EZ GO walker   Person(s) Educated Patient;Spouse   Methods Explanation;Demonstration;Verbal cues   Comprehension Returned demonstration;Verbalized understanding;Verbal cues required             OT Long Term Goals - 03/24/17 1523      OT LONG TERM GOAL #1   Title Patient will demonstrate increase right hand grip to be able to cut food with modified independence.    Time 12   Period Weeks   Status Partially Met     OT LONG TERM GOAL #2   Title Patient will complete donning long sleeve shirt with modified independence.   Baseline requires assist with long sleeves, can perform short sleeves.   Time 12   Period Weeks   Status Partially Met     OT LONG TERM GOAL #3   Title Patient will improve coordination to manage buttons on clothing with occasional assistance only.   Baseline requires assist with buttons each time   Time 12   Period Weeks   Status Partially Met     OT LONG TERM GOAL #4   Title Patient will complete managing pants after toileting with modified independence.    Baseline assist most days    Time 12   Period Weeks   Status On-going     OT LONG TERM GOAL #5   Title Patient will improve hand strength bilaterally to be able to don socks with modified independence.    Baseline unable at evaluation   Time 4   Period Weeks   Status Achieved     OT LONG TERM GOAL #6   Title Patient will improve gait speed and endurance and be able to walk 1050 feet in 6 minutes to negotiate around the home and community safely in 4 weeks   Baseline 920 feet prior to LSVT BIG, 1195 feet at 16th visit   Time 4   Period Weeks   Status Achieved     OT LONG TERM GOAL #7   Title Patient will complete HEP for maximal daily exercises with modified independence in 4 weeks   Time 12   Period  Weeks   Status Partially Met     OT LONG TERM GOAL #8   Title Patient will transfer from sit to stand without the use of arms safely and independently from a variety of chairs/surfaces in 4 weeks.    Baseline difficulty from lower surfaces   Time 4   Period Weeks   Status Achieved     OT LONG TERM GOAL  #9  Baseline Patient will demonstrate decreased episodes of freezing of behaviors from score of 16 to score of less than 12.   Time 4   Period Weeks   Status Achieved               Plan - 04/29/17 2044    Clinical Impression Statement Patient performed well with EZ GO walker this date, one episode of freezing but was able to respond to cues to correct by bringing walker towards him versus trying to advance foot when frozen.  Patient will need additional exposure to use of walker to determine safety and effectiveness with functional mobility.     Rehab Potential Good   Current Impairments/barriers affecting progress: positive: family support, negative: level of motivation, progressive disease   OT Frequency 2x / week   OT Duration 12 weeks   OT Treatment/Interventions Self-care/ADL training;Moist Heat;DME and/or AE instruction;Patient/family education;Therapeutic exercises;Balance training;Therapeutic exercise;Therapeutic activities;Neuromuscular education;Functional Mobility Training;Manual Therapy   Consulted and Agree with Plan of Care Patient;Family member/caregiver   Family Member Consulted wife      Patient will benefit from skilled therapeutic intervention in order to improve the following deficits and impairments:  Abnormal gait, Decreased coordination, Decreased range of motion, Difficulty walking, Decreased endurance, Decreased safety awareness, Decreased activity tolerance, Decreased balance, Impaired UE functional use, Pain, Decreased mobility, Decreased strength  Visit Diagnosis: Difficulty in walking, not elsewhere classified  Unsteadiness on feet  Muscle  weakness (generalized)  Other lack of coordination  Abnormal posture  Chronic right shoulder pain    Problem List Patient Active Problem List   Diagnosis Date Noted  . BPH with urinary obstruction 11/14/2016  . Urinary frequency 10/10/2016  . Microscopic hematuria 10/10/2016  . Parkinson's syndrome ([place]) 06/14/2016  . Arthritis 06/14/2016  . Depression 06/14/2016  . Heartburn 06/14/2016  . Urinary incontinence 06/14/2016  . Skin lesion 06/14/2016  . Dementia 06/14/2016  . Bilateral edema of lower extremity 06/14/2016   Achilles Dunk, OTR/L, CLT  Lovett,Amy 04/29/2017, 8:47 PM  [place] MAIN [facility] SERVICES [address] [place], [place], [address] Phone: [phone]   Fax:  [phone]  Name: Luke Durham MRN: [id number] Date of Birth: 1933-03-28

## 2017-05-01 ENCOUNTER — Ambulatory Visit: Payer: Medicare Other | Admitting: Occupational Therapy

## 2017-05-01 DIAGNOSIS — R2681 Unsteadiness on feet: Secondary | ICD-10-CM

## 2017-05-01 DIAGNOSIS — M25511 Pain in right shoulder: Secondary | ICD-10-CM

## 2017-05-01 DIAGNOSIS — M6281 Muscle weakness (generalized): Secondary | ICD-10-CM

## 2017-05-01 DIAGNOSIS — G8929 Other chronic pain: Secondary | ICD-10-CM

## 2017-05-01 DIAGNOSIS — R262 Difficulty in walking, not elsewhere classified: Secondary | ICD-10-CM | POA: Diagnosis not present

## 2017-05-01 DIAGNOSIS — R293 Abnormal posture: Secondary | ICD-10-CM

## 2017-05-01 DIAGNOSIS — R278 Other lack of coordination: Secondary | ICD-10-CM

## 2017-05-04 ENCOUNTER — Encounter: Payer: Medicare Other | Admitting: Occupational Therapy

## 2017-05-08 ENCOUNTER — Ambulatory Visit: Payer: Medicare Other | Admitting: Occupational Therapy

## 2017-05-08 DIAGNOSIS — R262 Difficulty in walking, not elsewhere classified: Secondary | ICD-10-CM | POA: Diagnosis not present

## 2017-05-08 DIAGNOSIS — R293 Abnormal posture: Secondary | ICD-10-CM

## 2017-05-08 DIAGNOSIS — M6281 Muscle weakness (generalized): Secondary | ICD-10-CM

## 2017-05-08 DIAGNOSIS — M25511 Pain in right shoulder: Secondary | ICD-10-CM

## 2017-05-08 DIAGNOSIS — R278 Other lack of coordination: Secondary | ICD-10-CM

## 2017-05-08 DIAGNOSIS — R2681 Unsteadiness on feet: Secondary | ICD-10-CM

## 2017-05-08 DIAGNOSIS — G8929 Other chronic pain: Secondary | ICD-10-CM

## 2017-05-08 NOTE — Therapy (Signed)
[place] MAIN [facility] SERVICES [address] [place], [place], [address] Phone: [phone]   Fax:  [phone]  Occupational Therapy Treatment  Patient Details  Name: Luke Durham MRN: [id number] Date of Birth: [birth year] Referring Provider: Marlyn Corporal  Encounter Date: 05/01/2017      OT End of Session - 05/08/17 1506    Visit Number 39   Number of Visits 39   Date for OT Re-Evaluation 06/01/17   Authorization Type Medicare visit 24   OT Start Time 1300   OT Stop Time 1401   OT Time Calculation (min) 61 min   Activity Tolerance Patient tolerated treatment well   Behavior During Therapy [facility] for tasks assessed/performed      Past Medical History:  Diagnosis Date  . Anxiety   . Arthritis   . Asthma    childhood asthma  . Cancer ([place]) 7 years ago   lymphoma, [place] (recent)  . Chronic kidney disease   . Colon polyps   . GERD (gastroesophageal reflux disease)   . History of kidney stones   . Parkinson disease [facility])     Past Surgical History:  Procedure Laterality Date  . COLON SURGERY    . CYSTOSCOPY WITH LITHOLAPAXY N/A 11/14/2016   Procedure: CYSTOSCOPY WITH LITHOLAPAXY;  Surgeon: Hollice Espy, MD;  Location: [facility] ORS;  Service: Urology;  Laterality: N/A;  . EYE SURGERY Bilateral    Cataract Extraction with IOL  . HERNIA REPAIR  20 years  . MOHS SURGERY    . SMALL INTESTINE SURGERY     Per patient 7 years  . TRANSURETHRAL RESECTION OF PROSTATE N/A 11/14/2016   Procedure: TRANSURETHRAL RESECTION OF THE PROSTATE (TURP);  Surgeon: Hollice Espy, MD;  Location: [facility] ORS;  Service: Urology;  Laterality: N/A;    There were no vitals filed for this visit.      Subjective Assessment - 05/08/17 1505    Subjective  Patient reports she tried to go walker at home over the weekend and likes the way it turns. He reports his wife is concerned that the wheels on stable enough and is afraid the Gilford Rile will get away from him.  Patient  requesting to come only one time this week and see how things go at home.    Patient Stated Goals Patient reports he wants to be able to do more for himself and around the house.                       OT Treatments/Exercises (OP) - 05/08/17 1508      ADLs   ADL Comments Patient seen for turning in small spaces such as the closet and bathroom with cues for walker management as well as amplitude of steps when turning, utilizing quarter turns with cues.  Patient did not demonstrate a freezing episode but one slight hesitation lasting 1-2 secs.       Neurological Re-education Exercises   Other Exercises 1 Patient seen for instruction of LSVT BIG exercises: LSVT Daily Session Maximal Daily Exercises: Sustained movements are designed to rescale the amplitude of movement output for generalization to daily functional activities. Performed as follows for 1 set of 10 repetitions each: Multi directional sustained movements- 1) Floor to ceiling, 2) Side to side. Multi directional Repetitive movements performed in standing and are designed to provide retraining effort needed for sustained muscle activation in tasks Performed as follows: 3) Step and reach forward, 4) Step and Reach Backwards, 5) Step and  reach sideways, 6) Rock and reach forward/backward, 7) Rock and reach sideways. Performed with adapted technique with use of chair for all exercises in standing, therapist cues in order to help guide and direct patient at home for home program. Patient required CGA assist for all exercises in standing, minimal verbal and tactile cues provided.   Other Exercises 2 Functional mobility skills with rolling walker around clinic in open spaces and smaller spaces with cues for turning behaviors.  Ambulated 2 trials of 300 feet, cues for posture and amplitude of steps when walking.                OT Education - 05/08/17 1505    Education provided Yes   Education Details functional mobility, turning  in small spaces   Person(s) Educated Patient   Methods Explanation;Demonstration;Verbal cues   Comprehension Verbal cues required;Returned demonstration;Verbalized understanding             OT Long Term Goals - 03/24/17 1523      OT LONG TERM GOAL #1   Title Patient will demonstrate increase right hand grip to be able to cut food with modified independence.    Time 12   Period Weeks   Status Partially Met     OT LONG TERM GOAL #2   Title Patient will complete donning long sleeve shirt with modified independence.   Baseline requires assist with long sleeves, can perform short sleeves.   Time 12   Period Weeks   Status Partially Met     OT LONG TERM GOAL #3   Title Patient will improve coordination to manage buttons on clothing with occasional assistance only.   Baseline requires assist with buttons each time   Time 12   Period Weeks   Status Partially Met     OT LONG TERM GOAL #4   Title Patient will complete managing pants after toileting with modified independence.    Baseline assist most days    Time 12   Period Weeks   Status On-going     OT LONG TERM GOAL #5   Title Patient will improve hand strength bilaterally to be able to don socks with modified independence.    Baseline unable at evaluation   Time 4   Period Weeks   Status Achieved     OT LONG TERM GOAL #6   Title Patient will improve gait speed and endurance and be able to walk 1050 feet in 6 minutes to negotiate around the home and community safely in 4 weeks   Baseline 920 feet prior to LSVT BIG, 1195 feet at 16th visit   Time 4   Period Weeks   Status Achieved     OT LONG TERM GOAL #7   Title Patient will complete HEP for maximal daily exercises with modified independence in 4 weeks   Time 12   Period Weeks   Status Partially Met     OT LONG TERM GOAL #8   Title Patient will transfer from sit to stand without the use of arms safely and independently from a variety of chairs/surfaces in 4  weeks.    Baseline difficulty from lower surfaces   Time 4   Period Weeks   Status Achieved     OT LONG TERM GOAL  #9   Baseline Patient will demonstrate decreased episodes of freezing of behaviors from score of 16 to score of less than 12.   Time 4   Period Weeks   Status Achieved  Plan - 05/08/17 1507    Clinical Impression Statement Patient remains convinced he would like to get another walker and likes an option of the rollator however therapist is concerned with opposite braking system patient is likely to be more at risk for falls.  His wife did not like the swivel wheels on the [place] GO walker although patient liked the capability of turning without effort. Patient requesting to try 1 session of therapy this week and is concerned about his wife having the burden of taking him to all his appointments and helping him at home.  Patient continues to report more freezing of gait at home versus in the clinic and especially more at night.  Will continue to work towards addressing these issues and interventions to improve function and reduce risk for falls.    Rehab Potential Good   Current Impairments/barriers affecting progress: positive: family support, negative: level of motivation, progressive disease   OT Frequency 2x / week   OT Duration 12 weeks   OT Treatment/Interventions Self-care/ADL training;Moist Heat;DME and/or AE instruction;Patient/family education;Therapeutic exercises;Balance training;Therapeutic exercise;Therapeutic activities;Neuromuscular education;Functional Mobility Training;Manual Therapy   Consulted and Agree with Plan of Care Patient;Family member/caregiver   Family Member Consulted wife      Patient will benefit from skilled therapeutic intervention in order to improve the following deficits and impairments:  Abnormal gait, Decreased coordination, Decreased range of motion, Difficulty walking, Decreased endurance, Decreased safety awareness,  Decreased activity tolerance, Decreased balance, Impaired UE functional use, Pain, Decreased mobility, Decreased strength  Visit Diagnosis: Difficulty in walking, not elsewhere classified  Unsteadiness on feet  Muscle weakness (generalized)  Other lack of coordination  Abnormal posture  Chronic right shoulder pain    Problem List Patient Active Problem List   Diagnosis Date Noted  . BPH with urinary obstruction 11/14/2016  . Urinary frequency 10/10/2016  . Microscopic hematuria 10/10/2016  . Parkinson's syndrome ([place]) 06/14/2016  . Arthritis 06/14/2016  . Depression 06/14/2016  . Heartburn 06/14/2016  . Urinary incontinence 06/14/2016  . Skin lesion 06/14/2016  . Dementia 06/14/2016  . Bilateral edema of lower extremity 06/14/2016   Achilles Dunk, OTR/L, CLT  Lovett,Amy 05/08/2017, 3:14 PM  [place] MAIN [facility] SERVICES [address] [place], [place], [address] Phone: [phone]   Fax:  [phone]  Name: Duel Conrad MRN: [id number] Date of Birth: 12/16/1932

## 2017-05-11 ENCOUNTER — Ambulatory Visit: Payer: Medicare Other | Admitting: Occupational Therapy

## 2017-05-11 DIAGNOSIS — R262 Difficulty in walking, not elsewhere classified: Secondary | ICD-10-CM | POA: Diagnosis not present

## 2017-05-11 DIAGNOSIS — M25511 Pain in right shoulder: Secondary | ICD-10-CM

## 2017-05-11 DIAGNOSIS — R278 Other lack of coordination: Secondary | ICD-10-CM

## 2017-05-11 DIAGNOSIS — R2681 Unsteadiness on feet: Secondary | ICD-10-CM

## 2017-05-11 DIAGNOSIS — G8929 Other chronic pain: Secondary | ICD-10-CM

## 2017-05-11 DIAGNOSIS — M6281 Muscle weakness (generalized): Secondary | ICD-10-CM

## 2017-05-11 DIAGNOSIS — R293 Abnormal posture: Secondary | ICD-10-CM

## 2017-05-13 ENCOUNTER — Encounter: Payer: Self-pay | Admitting: Occupational Therapy

## 2017-05-13 NOTE — Therapy (Signed)
[place] MAIN [facility] SERVICES [address] [place], [place], [address] Phone: [phone]   Fax:  [phone]  Occupational Therapy Treatment/Progress Update  Patient Details  Name: Luke Durham MRN: [id number] Date of Birth: 15-Jun-1933 Referring Provider: Marlyn Corporal  Encounter Date: 05/08/2017      OT End of Session - 05/13/17 2348    Visit Number 40   Number of Visits 55   Date for OT Re-Evaluation 06/01/17   Authorization Type Medicare visit 55   OT Start Time 1255   OT Stop Time 1400   OT Time Calculation (min) 65 min   Activity Tolerance Patient tolerated treatment well   Behavior During Therapy [facility] for tasks assessed/performed      Past Medical History:  Diagnosis Date  . Anxiety   . Arthritis   . Asthma    childhood asthma  . Cancer ([place]) 7 years ago   lymphoma, [place] (recent)  . Chronic kidney disease   . Colon polyps   . GERD (gastroesophageal reflux disease)   . History of kidney stones   . Parkinson disease [facility])     Past Surgical History:  Procedure Laterality Date  . COLON SURGERY    . CYSTOSCOPY WITH LITHOLAPAXY N/A 11/14/2016   Procedure: CYSTOSCOPY WITH LITHOLAPAXY;  Surgeon: Hollice Espy, MD;  Location: [facility] ORS;  Service: Urology;  Laterality: N/A;  . EYE SURGERY Bilateral    Cataract Extraction with IOL  . HERNIA REPAIR  20 years  . MOHS SURGERY    . SMALL INTESTINE SURGERY     Per patient 7 years  . TRANSURETHRAL RESECTION OF PROSTATE N/A 11/14/2016   Procedure: TRANSURETHRAL RESECTION OF THE PROSTATE (TURP);  Surgeon: Hollice Espy, MD;  Location: [facility] ORS;  Service: Urology;  Laterality: N/A;    There were no vitals filed for this visit.      Subjective Assessment - 05/13/17 2346    Subjective  Patient reports he is still having issues with freezing of gait at night, he reports his legs are not sore like before when this happens.    Pertinent History Patient reports he was diagnosed with  Parkinson's disease about 10 plus years ago.  He reports a more recent decline in function in his daily activities in the last 6-12 months.  He has been seeing PT for the last couple months.   Patient Stated Goals Patient reports he wants to be able to do more for himself and around the house.    Currently in Pain? No/denies   Pain Score 0-No pain                      OT Treatments/Exercises (OP) - 05/13/17 2347      ADLs   ADL Comments Patient seen for turning in small spaces such as the closet and bathroom with cues for walker management as well as amplitude of steps when turning, utilizing quarter turns with cues. Patient did not demonstrate a freezing episode but one slight hesitation lasting 1-2 secs.      Neurological Re-education Exercises   Other Exercises 1 designed to rescale the amplitude of movement output for generalization to daily functional activities. Performed as follows for 1 set of 10 repetitions each: Multi directional sustained movements- 1) Floor to ceiling, 2) Side to side. Multi directional Repetitive movements performed in standing and are designed to provide retraining effort needed for sustained muscle activation in tasks Performed as follows: 3) Step and reach forward,  4) Step and Reach Backwards, 5) Step and reach sideways, 6) Rock and reach forward/backward, 7) Rock and reach sideways. Performed with adapted technique with use of chair for all exercises in standing, therapist cues in order to help guide and direct patient at home for home program. Patient required CGA assist for all exercises in standing, minimal verbal and tactile cues provided.   Other Exercises 2 Functional mobility skills with rolling walker around clinic in open spaces and smaller spaces with cues for turning behaviors. Ambulated 2 trials of 300 feet, cues for posture and amplitude of steps when walking.                OT Education - 05/13/17 2348    Education provided Yes    Education Details freezing of gait   Person(s) Educated Patient   Methods Explanation;Demonstration;Verbal cues   Comprehension Verbal cues required;Returned demonstration;Verbalized understanding             OT Long Term Goals - 05/13/17 2349      OT LONG TERM GOAL #1   Title Patient will demonstrate increase right hand grip to be able to cut food with modified independence.    Time 12   Period Weeks   Status Partially Met     OT LONG TERM GOAL #2   Title Patient will complete donning long sleeve shirt with modified independence.   Baseline requires assist with long sleeves, can perform short sleeves.   Time 12   Period Weeks   Status Partially Met     OT LONG TERM GOAL #3   Title Patient will improve coordination to manage buttons on clothing with occasional assistance only.   Baseline requires assist with buttons each time   Time 12   Period Weeks   Status Partially Met     OT LONG TERM GOAL #4   Title Patient will complete managing pants after toileting with modified independence.    Baseline assist most days    Time 12   Period Weeks   Status On-going     OT LONG TERM GOAL #5   Title Patient will improve hand strength bilaterally to be able to don socks with modified independence.    Baseline unable at evaluation   Time 4   Period Weeks   Status Achieved     OT LONG TERM GOAL #6   Title Patient will improve gait speed and endurance and be able to walk 1050 feet in 6 minutes to negotiate around the home and community safely in 4 weeks   Baseline 920 feet prior to LSVT BIG, 1195 feet at 16th visit   Time 4   Period Weeks   Status Achieved     OT LONG TERM GOAL #7   Title Patient will complete HEP for maximal daily exercises with modified independence in 4 weeks   Time 12   Period Weeks   Status Partially Met     OT LONG TERM GOAL #8   Title Patient will transfer from sit to stand without the use of arms safely and independently from a variety of  chairs/surfaces in 4 weeks.    Baseline difficulty from lower surfaces   Time 4   Period Weeks   Status Achieved     OT LONG TERM GOAL  #9   Baseline Patient will demonstrate decreased episodes of freezing of behaviors from score of 16 to score of less than 12.   Time 4   Period Weeks  Status Achieved               Plan - 05/13/17 2349    Clinical Impression Statement Patient has continued to make progress with exercises and is requiring decreased cues for technique.  He still gets confused with stepping forwards and to the side with arm positions.  Patient continues to have issues with freezing of gait at home at night when getting up to go to the bathroom.  He is progressing with turning in small spaces.  He remains a fall risk and will continue to work towards improving functional mobility, transfers, and self care tasks.    Rehab Potential Good   Current Impairments/barriers affecting progress: positive: family support, negative: level of motivation, progressive disease   OT Frequency 2x / week   OT Duration 12 weeks   OT Treatment/Interventions Self-care/ADL training;Moist Heat;DME and/or AE instruction;Patient/family education;Therapeutic exercises;Balance training;Therapeutic exercise;Therapeutic activities;Neuromuscular education;Functional Mobility Training;Manual Therapy   Consulted and Agree with Plan of Care Patient;Family member/caregiver   Family Member Consulted wife      Patient will benefit from skilled therapeutic intervention in order to improve the following deficits and impairments:  Abnormal gait, Decreased coordination, Decreased range of motion, Difficulty walking, Decreased endurance, Decreased safety awareness, Decreased activity tolerance, Decreased balance, Impaired UE functional use, Pain, Decreased mobility, Decreased strength  Visit Diagnosis: Difficulty in walking, not elsewhere classified  Unsteadiness on feet  Muscle weakness  (generalized)  Other lack of coordination  Abnormal posture  Chronic right shoulder pain    Problem List Patient Active Problem List   Diagnosis Date Noted  . BPH with urinary obstruction 11/14/2016  . Urinary frequency 10/10/2016  . Microscopic hematuria 10/10/2016  . Parkinson's syndrome ([place]) 06/14/2016  . Arthritis 06/14/2016  . Depression 06/14/2016  . Heartburn 06/14/2016  . Urinary incontinence 06/14/2016  . Skin lesion 06/14/2016  . Dementia 06/14/2016  . Bilateral edema of lower extremity 06/14/2016   Achilles Dunk, OTR/L, CLT  Lovett,Amy 05/13/2017, 11:53 PM  [place] MAIN [facility] SERVICES [address] [place], [place], [address] Phone: [phone]   Fax:  [phone]  Name: Luke Durham MRN: [id number] Date of Birth: 1933-02-28

## 2017-05-13 NOTE — Therapy (Signed)
[place] MAIN [facility] SERVICES [address] [place], [place], [address] Phone: [phone]   Fax:  [phone]  Occupational Therapy Treatment  Patient Details  Name: Luke Durham MRN: [id number] Date of Birth: 08-Jan-1933 Referring Provider: Marlyn Corporal  Encounter Date: 05/11/2017      OT End of Session - 05/13/17 2356    Visit Number 41   Number of Visits 25   Date for OT Re-Evaluation 06/01/17   Authorization Type Medicare visit 60   OT Start Time 0950   OT Stop Time 1054   OT Time Calculation (min) 64 min   Activity Tolerance Patient tolerated treatment well   Behavior During Therapy [facility] for tasks assessed/performed      Past Medical History:  Diagnosis Date  . Anxiety   . Arthritis   . Asthma    childhood asthma  . Cancer ([place]) 7 years ago   lymphoma, [place] (recent)  . Chronic kidney disease   . Colon polyps   . GERD (gastroesophageal reflux disease)   . History of kidney stones   . Parkinson disease [facility])     Past Surgical History:  Procedure Laterality Date  . COLON SURGERY    . CYSTOSCOPY WITH LITHOLAPAXY N/A 11/14/2016   Procedure: CYSTOSCOPY WITH LITHOLAPAXY;  Surgeon: Hollice Espy, MD;  Location: [facility] ORS;  Service: Urology;  Laterality: N/A;  . EYE SURGERY Bilateral    Cataract Extraction with IOL  . HERNIA REPAIR  20 years  . MOHS SURGERY    . SMALL INTESTINE SURGERY     Per patient 7 years  . TRANSURETHRAL RESECTION OF PROSTATE N/A 11/14/2016   Procedure: TRANSURETHRAL RESECTION OF THE PROSTATE (TURP);  Surgeon: Hollice Espy, MD;  Location: [facility] ORS;  Service: Urology;  Laterality: N/A;    There were no vitals filed for this visit.      Subjective Assessment - 05/13/17 2354    Subjective  Patient reports he called his doctor and she adjusted medications for night time starting this week to see if this will impact freezing of gait at night.     Pertinent History Patient reports he was diagnosed with  Parkinson's disease about 10 plus years ago.  He reports a more recent decline in function in his daily activities in the last 6-12 months.  He has been seeing PT for the last couple months.   Patient Stated Goals Patient reports he wants to be able to do more for himself and around the house.    Currently in Pain? No/denies   Pain Score 0-No pain                      OT Treatments/Exercises (OP) - 05/13/17 2347      ADLs   ADL Comments Patient seen for turning in small spaces such as the closet and bathroom with cues for walker management as well as amplitude of steps when turning, utilizing quarter turns with cues. Patient did not demonstrate a freezing episode but one slight hesitation lasting 1-2 secs.      Neurological Re-education Exercises   Other Exercises 1 designed to rescale the amplitude of movement output for generalization to daily functional activities. Performed as follows for 1 set of 10 repetitions each: Multi directional sustained movements- 1) Floor to ceiling, 2) Side to side. Multi directional Repetitive movements performed in standing and are designed to provide retraining effort needed for sustained muscle activation in tasks Performed as follows: 3) Step and  reach forward, 4) Step and Reach Backwards, 5) Step and reach sideways, 6) Rock and reach forward/backward, 7) Rock and reach sideways. Performed with adapted technique with use of chair for all exercises in standing, therapist cues in order to help guide and direct patient at home for home program. Patient required CGA assist for all exercises in standing, minimal verbal and tactile cues provided.   Other Exercises 2 Functional mobility skills with rolling walker around clinic in open spaces and smaller spaces with cues for turning behaviors. Ambulated 2 trials of 300 feet, cues for posture and amplitude of steps when walking.                OT Education - 05/13/17 2355    Education provided Yes    Education Details HEP, freezing behaviors, small spaces.   Person(s) Educated Patient   Methods Explanation;Demonstration;Verbal cues   Comprehension Verbal cues required;Returned demonstration;Verbalized understanding             OT Long Term Goals - 05/13/17 2349      OT LONG TERM GOAL #1   Title Patient will demonstrate increase right hand grip to be able to cut food with modified independence.    Time 12   Period Weeks   Status Partially Met     OT LONG TERM GOAL #2   Title Patient will complete donning long sleeve shirt with modified independence.   Baseline requires assist with long sleeves, can perform short sleeves.   Time 12   Period Weeks   Status Partially Met     OT LONG TERM GOAL #3   Title Patient will improve coordination to manage buttons on clothing with occasional assistance only.   Baseline requires assist with buttons each time   Time 12   Period Weeks   Status Partially Met     OT LONG TERM GOAL #4   Title Patient will complete managing pants after toileting with modified independence.    Baseline assist most days    Time 12   Period Weeks   Status On-going     OT LONG TERM GOAL #5   Title Patient will improve hand strength bilaterally to be able to don socks with modified independence.    Baseline unable at evaluation   Time 4   Period Weeks   Status Achieved     OT LONG TERM GOAL #6   Title Patient will improve gait speed and endurance and be able to walk 1050 feet in 6 minutes to negotiate around the home and community safely in 4 weeks   Baseline 920 feet prior to LSVT BIG, 1195 feet at 16th visit   Time 4   Period Weeks   Status Achieved     OT LONG TERM GOAL #7   Title Patient will complete HEP for maximal daily exercises with modified independence in 4 weeks   Time 12   Period Weeks   Status Partially Met     OT LONG TERM GOAL #8   Title Patient will transfer from sit to stand without the use of arms safely and independently  from a variety of chairs/surfaces in 4 weeks.    Baseline difficulty from lower surfaces   Time 4   Period Weeks   Status Achieved     OT LONG TERM GOAL  #9   Baseline Patient will demonstrate decreased episodes of freezing of behaviors from score of 16 to score of less than 12.   Time 4  Period Weeks   Status Achieved               Plan - 05/13/17 2356    Rehab Potential Good   Current Impairments/barriers affecting progress: positive: family support, negative: level of motivation, progressive disease   OT Frequency 2x / week   OT Duration 12 weeks   OT Treatment/Interventions Self-care/ADL training;Moist Heat;DME and/or AE instruction;Patient/family education;Therapeutic exercises;Balance training;Therapeutic exercise;Therapeutic activities;Neuromuscular education;Functional Mobility Training;Manual Therapy   Consulted and Agree with Plan of Care Patient;Family member/caregiver   Family Member Consulted wife      Patient will benefit from skilled therapeutic intervention in order to improve the following deficits and impairments:  Abnormal gait, Decreased coordination, Decreased range of motion, Difficulty walking, Decreased endurance, Decreased safety awareness, Decreased activity tolerance, Decreased balance, Impaired UE functional use, Pain, Decreased mobility, Decreased strength  Visit Diagnosis: Difficulty in walking, not elsewhere classified  Muscle weakness (generalized)  Unsteadiness on feet  Other lack of coordination  Abnormal posture  Chronic right shoulder pain    Problem List Patient Active Problem List   Diagnosis Date Noted  . BPH with urinary obstruction 11/14/2016  . Urinary frequency 10/10/2016  . Microscopic hematuria 10/10/2016  . Parkinson's syndrome ([place]) 06/14/2016  . Arthritis 06/14/2016  . Depression 06/14/2016  . Heartburn 06/14/2016  . Urinary incontinence 06/14/2016  . Skin lesion 06/14/2016  . Dementia 06/14/2016  .  Bilateral edema of lower extremity 06/14/2016   Achilles Dunk, OTR/L, CLT  Lovett,Amy 05/13/2017, 11:57 PM  [place] MAIN [facility] SERVICES [address] [place], [place], [address] Phone: [phone]   Fax:  [phone]  Name: Luke Durham MRN: [id number] Date of Birth: [birth year]

## 2017-05-16 ENCOUNTER — Ambulatory Visit: Payer: Medicare Other | Admitting: Occupational Therapy

## 2017-05-16 DIAGNOSIS — R2681 Unsteadiness on feet: Secondary | ICD-10-CM

## 2017-05-16 DIAGNOSIS — R262 Difficulty in walking, not elsewhere classified: Secondary | ICD-10-CM

## 2017-05-16 DIAGNOSIS — M25511 Pain in right shoulder: Secondary | ICD-10-CM

## 2017-05-16 DIAGNOSIS — M6281 Muscle weakness (generalized): Secondary | ICD-10-CM

## 2017-05-16 DIAGNOSIS — G8929 Other chronic pain: Secondary | ICD-10-CM

## 2017-05-16 DIAGNOSIS — R293 Abnormal posture: Secondary | ICD-10-CM

## 2017-05-16 DIAGNOSIS — R278 Other lack of coordination: Secondary | ICD-10-CM

## 2017-05-18 ENCOUNTER — Encounter: Payer: Self-pay | Admitting: Occupational Therapy

## 2017-05-19 ENCOUNTER — Ambulatory Visit: Payer: Medicare Other | Admitting: Occupational Therapy

## 2017-05-19 ENCOUNTER — Encounter: Payer: Self-pay | Admitting: Occupational Therapy

## 2017-05-19 DIAGNOSIS — R2681 Unsteadiness on feet: Secondary | ICD-10-CM

## 2017-05-19 DIAGNOSIS — G8929 Other chronic pain: Secondary | ICD-10-CM

## 2017-05-19 DIAGNOSIS — M25511 Pain in right shoulder: Secondary | ICD-10-CM

## 2017-05-19 DIAGNOSIS — R262 Difficulty in walking, not elsewhere classified: Secondary | ICD-10-CM | POA: Diagnosis not present

## 2017-05-19 DIAGNOSIS — R293 Abnormal posture: Secondary | ICD-10-CM

## 2017-05-19 DIAGNOSIS — R278 Other lack of coordination: Secondary | ICD-10-CM

## 2017-05-19 DIAGNOSIS — M6281 Muscle weakness (generalized): Secondary | ICD-10-CM

## 2017-05-19 NOTE — Therapy (Signed)
[place] MAIN [facility] SERVICES [address] [place], [place], [address] Phone: [phone]   Fax:  [phone]  Occupational Therapy Treatment  Patient Details  Name: Luke Durham MRN: [id number] Date of Birth: 22-Dec-1932 Referring Provider: Marlyn Corporal  Encounter Date: 05/19/2017      OT End of Session - 05/19/17 1112    Visit Number 43   Number of Visits 3   Date for OT Re-Evaluation 06/01/17   Authorization Type Medicare visit 12   OT Start Time 1000   OT Stop Time 1105   OT Time Calculation (min) 65 min   Activity Tolerance Patient tolerated treatment well   Behavior During Therapy [facility] for tasks assessed/performed      Past Medical History:  Diagnosis Date  . Anxiety   . Arthritis   . Asthma    childhood asthma  . Cancer ([place]) 7 years ago   lymphoma, [place] (recent)  . Chronic kidney disease   . Colon polyps   . GERD (gastroesophageal reflux disease)   . History of kidney stones   . Parkinson disease [facility])     Past Surgical History:  Procedure Laterality Date  . COLON SURGERY    . CYSTOSCOPY WITH LITHOLAPAXY N/A 11/14/2016   Procedure: CYSTOSCOPY WITH LITHOLAPAXY;  Surgeon: Hollice Espy, MD;  Location: [facility] ORS;  Service: Urology;  Laterality: N/A;  . EYE SURGERY Bilateral    Cataract Extraction with IOL  . HERNIA REPAIR  20 years  . MOHS SURGERY    . SMALL INTESTINE SURGERY     Per patient 7 years  . TRANSURETHRAL RESECTION OF PROSTATE N/A 11/14/2016   Procedure: TRANSURETHRAL RESECTION OF THE PROSTATE (TURP);  Surgeon: Hollice Espy, MD;  Location: [facility] ORS;  Service: Urology;  Laterality: N/A;    There were no vitals filed for this visit.      Subjective Assessment - 05/19/17 1107    Subjective  Patient has an appt with the doctor next tuesday.  He brought in his flashlight which he uses at home at night when he gets up to go to the bathroom.    Pertinent History Patient reports he was diagnosed with  Parkinson's disease about 10 plus years ago.  He reports a more recent decline in function in his daily activities in the last 6-12 months.  He has been seeing PT for the last couple months.   Patient Stated Goals Patient reports he wants to be able to do more for himself and around the house.    Currently in Pain? Yes   Pain Score 4    Pain Location Shoulder   Pain Orientation Right   Pain Descriptors / Indicators Aching   Pain Onset More than a month ago   Pain Frequency Intermittent                      OT Treatments/Exercises (OP) - 05/19/17 1108      ADLs   ADL Comments Patient seen for simulated bathroom mobility with use of his flashlight he uses at home at night.  Patient demonstrates increased difficulty with following directions when light is dim, this task would be better simulated in his home in his natural surroundings.  He does tend to freeze more when light is low even with use of flashlight.  He reports he uses his walker at times but other times he does not use it when getting up.  We discussed safety with this and the recommendation  to always use the walker and more lighting if possible.  Multiple trials and repetitions completed, cues provided for taking larger steps with functional mobility and turns.      Neurological Re-education Exercises   Other Exercises 1 Patient seen for instruction of LSVT BIG exercises: LSVT Daily Session Maximal Daily Exercises: Sustained movements are designed to rescale the amplitude of movement output for generalization to daily functional activities. Performed as follows for 1 set of 10 repetitions each: Multi directional sustained movements- 1) Floor to ceiling, 2) Side to side. Multi directional Repetitive movements performed in standing and are designed to provide retraining effort needed for sustained muscle activation in tasks Performed as follows: 3) Step and reach forward, 4) Step and Reach Backwards, 5) Step and reach  sideways, 6) Rock and reach forward/backward, 7) Rock and reach sideways. Performed with adapted technique with use of chair for all exercises in standing, therapist cues in order to help guide and direct patient at home for home program. Patient required CGA assist for all exercises in standing, minimal verbal and tactile cues provided.   Other Exercises 2 Wall stretches with use of ball for posture and ROM for 10 reps each.  Crossed leg stretch for accessing tying shoes, right and left with 3 to 5 sec hold.                 OT Education - 05/19/17 1112    Education provided Yes   Education Details use of walker and increased lighting for toileting at night.    Person(s) Educated Patient   Methods Explanation;Demonstration;Verbal cues   Comprehension Verbal cues required;Returned demonstration;Verbalized understanding             OT Long Term Goals - 05/13/17 2349      OT LONG TERM GOAL #1   Title Patient will demonstrate increase right hand grip to be able to cut food with modified independence.    Time 12   Period Weeks   Status Partially Met     OT LONG TERM GOAL #2   Title Patient will complete donning long sleeve shirt with modified independence.   Baseline requires assist with long sleeves, can perform short sleeves.   Time 12   Period Weeks   Status Partially Met     OT LONG TERM GOAL #3   Title Patient will improve coordination to manage buttons on clothing with occasional assistance only.   Baseline requires assist with buttons each time   Time 12   Period Weeks   Status Partially Met     OT LONG TERM GOAL #4   Title Patient will complete managing pants after toileting with modified independence.    Baseline assist most days    Time 12   Period Weeks   Status On-going     OT LONG TERM GOAL #5   Title Patient will improve hand strength bilaterally to be able to don socks with modified independence.    Baseline unable at evaluation   Time 4   Period  Weeks   Status Achieved     OT LONG TERM GOAL #6   Title Patient will improve gait speed and endurance and be able to walk 1050 feet in 6 minutes to negotiate around the home and community safely in 4 weeks   Baseline 920 feet prior to LSVT BIG, 1195 feet at 16th visit   Time 4   Period Weeks   Status Achieved     OT LONG TERM  GOAL #7   Title Patient will complete HEP for maximal daily exercises with modified independence in 4 weeks   Time 12   Period Weeks   Status Partially Met     OT LONG TERM GOAL #8   Title Patient will transfer from sit to stand without the use of arms safely and independently from a variety of chairs/surfaces in 4 weeks.    Baseline difficulty from lower surfaces   Time 4   Period Weeks   Status Achieved     OT LONG TERM GOAL  #9   Baseline Patient will demonstrate decreased episodes of freezing of behaviors from score of 16 to score of less than 12.   Time 4   Period Weeks   Status Achieved               Plan - 05/19/17 1113    Clinical Impression Statement Continued focus on daily exercises, night time toileting routine with varying amounts of light, use of flashlight and walker.  Patient continues to demo increased episodes of freezing when going to the bathroom at night.  He has done well with daytime toileting with no accidents since following routine of timed toileting, going before the urge gets so great.  During the day the episodes of freezing have decreased, however at night the incidence of this remains high, continuing to place him at risk for falls. Will continue to work towards strategies to assist with these areas.     Rehab Potential Good   Current Impairments/barriers affecting progress: positive: family support, negative: level of motivation, progressive disease   OT Frequency 2x / week   OT Duration 12 weeks   OT Treatment/Interventions Self-care/ADL training;Moist Heat;DME and/or AE instruction;Patient/family  education;Therapeutic exercises;Balance training;Therapeutic exercise;Therapeutic activities;Neuromuscular education;Functional Mobility Training;Manual Therapy   Consulted and Agree with Plan of Care Patient;Family member/caregiver   Family Member Consulted wife      Patient will benefit from skilled therapeutic intervention in order to improve the following deficits and impairments:  Abnormal gait, Decreased coordination, Decreased range of motion, Difficulty walking, Decreased endurance, Decreased safety awareness, Decreased activity tolerance, Decreased balance, Impaired UE functional use, Pain, Decreased mobility, Decreased strength  Visit Diagnosis: Difficulty in walking, not elsewhere classified  Muscle weakness (generalized)  Unsteadiness on feet  Other lack of coordination  Abnormal posture  Chronic right shoulder pain    Problem List Patient Active Problem List   Diagnosis Date Noted  . BPH with urinary obstruction 11/14/2016  . Urinary frequency 10/10/2016  . Microscopic hematuria 10/10/2016  . Parkinson's syndrome ([place]) 06/14/2016  . Arthritis 06/14/2016  . Depression 06/14/2016  . Heartburn 06/14/2016  . Urinary incontinence 06/14/2016  . Skin lesion 06/14/2016  . Dementia 06/14/2016  . Bilateral edema of lower extremity 06/14/2016   Achilles Dunk, OTR/L, CLT  Lovett,Amy 05/19/2017, 11:16 AM  [place] MAIN [facility] SERVICES [address] [place], [place], [address] Phone: [phone]   Fax:  [phone]  Name: Luke Durham MRN: [id number] Date of Birth: 1933/01/29

## 2017-05-19 NOTE — Therapy (Signed)
[place] MAIN [facility] SERVICES [address] [place], [place], [address] Phone: [phone]   Fax:  [phone]  Occupational Therapy Treatment  Patient Details  Name: Luke Durham MRN: [id number] Date of Birth: 26-Dec-1932 Referring Provider: Marlyn Corporal  Encounter Date: 05/16/2017      OT End of Session - 05/18/17 2206    Visit Number 42   Number of Visits 68   Date for OT Re-Evaluation 06/01/17   Authorization Type Medicare visit 14   OT Start Time 1259   OT Stop Time 1400   OT Time Calculation (min) 61 min   Activity Tolerance Patient tolerated treatment well   Behavior During Therapy [facility] for tasks assessed/performed      Past Medical History:  Diagnosis Date  . Anxiety   . Arthritis   . Asthma    childhood asthma  . Cancer ([place]) 7 years ago   lymphoma, [place] (recent)  . Chronic kidney disease   . Colon polyps   . GERD (gastroesophageal reflux disease)   . History of kidney stones   . Parkinson disease [facility])     Past Surgical History:  Procedure Laterality Date  . COLON SURGERY    . CYSTOSCOPY WITH LITHOLAPAXY N/A 11/14/2016   Procedure: CYSTOSCOPY WITH LITHOLAPAXY;  Surgeon: Hollice Espy, MD;  Location: [facility] ORS;  Service: Urology;  Laterality: N/A;  . EYE SURGERY Bilateral    Cataract Extraction with IOL  . HERNIA REPAIR  20 years  . MOHS SURGERY    . SMALL INTESTINE SURGERY     Per patient 7 years  . TRANSURETHRAL RESECTION OF PROSTATE N/A 11/14/2016   Procedure: TRANSURETHRAL RESECTION OF THE PROSTATE (TURP);  Surgeon: Hollice Espy, MD;  Location: [facility] ORS;  Service: Urology;  Laterality: N/A;    There were no vitals filed for this visit.      Subjective Assessment - 05/18/17 2205    Subjective  Patient reports he is planning to see the doctor in the next week about his freezing of gait especially more at night time.  We discussed issues with getting up at night in low light and sometimes not using his  assistive device which makes him freeze more as well.    Pertinent History Patient reports he was diagnosed with Parkinson's disease about 10 plus years ago.  He reports a more recent decline in function in his daily activities in the last 6-12 months.  He has been seeing PT for the last couple months.   Patient Stated Goals Patient reports he wants to be able to do more for himself and around the house.    Currently in Pain? No/denies   Pain Score 0-No pain                      OT Treatments/Exercises (OP) - 05/19/17 1034      ADLs   ADL Comments Patient seen for functional mobility to access the bathroom in low light to simulate night time toileting routine.  If light is dark and very dim, he tends to freeze.  When light is brighter he tends to move without difficulty.  Multiple trials completed with walker and cues in varying degrees of light.  Patient usually uses a flashlight and will bring next session.       Neurological Re-education Exercises   Other Exercises 1 Patient seen for instruction of LSVT BIG exercises: LSVT Daily Session Maximal Daily Exercises: Sustained movements are designed to rescale the  amplitude of movement output for generalization to daily functional activities. Performed as follows for 1 set of 10 repetitions each: Multi directional sustained movements- 1) Floor to ceiling, 2) Side to side. Multi directional Repetitive movements performed in standing and are designed to provide retraining effort needed for sustained muscle activation in tasks Performed as follows: 3) Step and reach forward, 4) Step and Reach Backwards, 5) Step and reach sideways, 6) Rock and reach forward/backward, 7) Rock and reach sideways. Performed with adapted technique with use of chair for all exercises in standing, therapist cues in order to help guide and direct patient at home for home program. Patient required CGA assist for all exercises in standing, minimal verbal and tactile cues  provided.                OT Education - 05/18/17 2206    Education provided Yes   Education Details continued focus on freezing of gait and performed in low light today   Person(s) Educated Patient;Spouse   Methods Explanation;Demonstration;Verbal cues   Comprehension Verbal cues required;Returned demonstration;Verbalized understanding             OT Long Term Goals - 05/13/17 2349      OT LONG TERM GOAL #1   Title Patient will demonstrate increase right hand grip to be able to cut food with modified independence.    Time 12   Period Weeks   Status Partially Met     OT LONG TERM GOAL #2   Title Patient will complete donning long sleeve shirt with modified independence.   Baseline requires assist with long sleeves, can perform short sleeves.   Time 12   Period Weeks   Status Partially Met     OT LONG TERM GOAL #3   Title Patient will improve coordination to manage buttons on clothing with occasional assistance only.   Baseline requires assist with buttons each time   Time 12   Period Weeks   Status Partially Met     OT LONG TERM GOAL #4   Title Patient will complete managing pants after toileting with modified independence.    Baseline assist most days    Time 12   Period Weeks   Status On-going     OT LONG TERM GOAL #5   Title Patient will improve hand strength bilaterally to be able to don socks with modified independence.    Baseline unable at evaluation   Time 4   Period Weeks   Status Achieved     OT LONG TERM GOAL #6   Title Patient will improve gait speed and endurance and be able to walk 1050 feet in 6 minutes to negotiate around the home and community safely in 4 weeks   Baseline 920 feet prior to LSVT BIG, 1195 feet at 16th visit   Time 4   Period Weeks   Status Achieved     OT LONG TERM GOAL #7   Title Patient will complete HEP for maximal daily exercises with modified independence in 4 weeks   Time 12   Period Weeks   Status  Partially Met     OT LONG TERM GOAL #8   Title Patient will transfer from sit to stand without the use of arms safely and independently from a variety of chairs/surfaces in 4 weeks.    Baseline difficulty from lower surfaces   Time 4   Period Weeks   Status Achieved     OT LONG TERM GOAL  #9  Baseline Patient will demonstrate decreased episodes of freezing of behaviors from score of 16 to score of less than 12.   Time 4   Period Weeks   Status Achieved               Plan - 05/18/17 2207    Clinical Impression Statement Patient continues to report increased freezing at night although he does have some episodes during the day.  It has been difficult to replicate this in the clinic since he does not have any episodes of freezing during therapy sessions.  Today we were able to replicate freezing in low to dim light simulating bathroom mobility when he gets up at night.  the lower the light, the more likely he was to freeze. He is also more likely to freeze if he doesn't use his walker at night.  Have encouraged him to use walker every time he gets up as well as increased lighting for safety.  He is planning to bring in his flashlight to use in the clinic to see how much light he is using.  Continue towards goals.     Rehab Potential Good   Current Impairments/barriers affecting progress: positive: family support, negative: level of motivation, progressive disease   OT Frequency 2x / week   OT Duration 12 weeks   OT Treatment/Interventions Self-care/ADL training;Moist Heat;DME and/or AE instruction;Patient/family education;Therapeutic exercises;Balance training;Therapeutic exercise;Therapeutic activities;Neuromuscular education;Functional Mobility Training;Manual Therapy   Consulted and Agree with Plan of Care Patient;Family member/caregiver   Family Member Consulted wife      Patient will benefit from skilled therapeutic intervention in order to improve the following deficits and  impairments:  Abnormal gait, Decreased coordination, Decreased range of motion, Difficulty walking, Decreased endurance, Decreased safety awareness, Decreased activity tolerance, Decreased balance, Impaired UE functional use, Pain, Decreased mobility, Decreased strength  Visit Diagnosis: Difficulty in walking, not elsewhere classified  Muscle weakness (generalized)  Unsteadiness on feet  Other lack of coordination  Abnormal posture  Chronic right shoulder pain    Problem List Patient Active Problem List   Diagnosis Date Noted  . BPH with urinary obstruction 11/14/2016  . Urinary frequency 10/10/2016  . Microscopic hematuria 10/10/2016  . Parkinson's syndrome ([place]) 06/14/2016  . Arthritis 06/14/2016  . Depression 06/14/2016  . Heartburn 06/14/2016  . Urinary incontinence 06/14/2016  . Skin lesion 06/14/2016  . Dementia 06/14/2016  . Bilateral edema of lower extremity 06/14/2016   Achilles Dunk, OTR/L, CLT  Lovett,Amy 05/19/2017, 10:43 AM  [place] MAIN [facility] SERVICES [address] [place], [place], [address] Phone: [phone]   Fax:  [phone]  Name: Rolf Fells MRN: [id number] Date of Birth: 1933-06-28

## 2017-05-23 ENCOUNTER — Encounter: Payer: Self-pay | Admitting: Occupational Therapy

## 2017-05-23 ENCOUNTER — Ambulatory Visit: Payer: Medicare Other | Admitting: Occupational Therapy

## 2017-05-23 DIAGNOSIS — R278 Other lack of coordination: Secondary | ICD-10-CM

## 2017-05-23 DIAGNOSIS — R293 Abnormal posture: Secondary | ICD-10-CM

## 2017-05-23 DIAGNOSIS — R262 Difficulty in walking, not elsewhere classified: Secondary | ICD-10-CM | POA: Diagnosis not present

## 2017-05-23 DIAGNOSIS — R2681 Unsteadiness on feet: Secondary | ICD-10-CM

## 2017-05-23 DIAGNOSIS — M6281 Muscle weakness (generalized): Secondary | ICD-10-CM

## 2017-05-23 NOTE — Therapy (Signed)
[place] MAIN [facility] SERVICES [address] [place], [place], [address] Phone: [phone]   Fax:  [phone]  Occupational Therapy Treatment  Patient Details  Name: Luke Durham MRN: [id number] Date of Birth: Feb 06, 1933 Referring Provider: Marlyn Corporal  Encounter Date: 05/23/2017      OT End of Session - 05/23/17 1637    Visit Number 3   Number of Visits 27   Date for OT Re-Evaluation 06/01/17   Authorization Type Medicare visit 81   OT Start Time 1328   OT Stop Time 1430   OT Time Calculation (min) 62 min   Activity Tolerance Patient tolerated treatment well   Behavior During Therapy [facility] for tasks assessed/performed      Past Medical History:  Diagnosis Date  . Anxiety   . Arthritis   . Asthma    childhood asthma  . Cancer ([place]) 7 years ago   lymphoma, [place] (recent)  . Chronic kidney disease   . Colon polyps   . GERD (gastroesophageal reflux disease)   . History of kidney stones   . Parkinson disease [facility])     Past Surgical History:  Procedure Laterality Date  . COLON SURGERY    . CYSTOSCOPY WITH LITHOLAPAXY N/A 11/14/2016   Procedure: CYSTOSCOPY WITH LITHOLAPAXY;  Surgeon: Hollice Espy, MD;  Location: [facility] ORS;  Service: Urology;  Laterality: N/A;  . EYE SURGERY Bilateral    Cataract Extraction with IOL  . HERNIA REPAIR  20 years  . MOHS SURGERY    . SMALL INTESTINE SURGERY     Per patient 7 years  . TRANSURETHRAL RESECTION OF PROSTATE N/A 11/14/2016   Procedure: TRANSURETHRAL RESECTION OF THE PROSTATE (TURP);  Surgeon: Hollice Espy, MD;  Location: [facility] ORS;  Service: Urology;  Laterality: N/A;    There were no vitals filed for this visit.      Subjective Assessment - 05/23/17 1626    Subjective  Patient reports he is going to his doctor appointment once he leaves therapy today.  Wants to ask about freezing behaviors at night and what may help in terms of his medication regime.  Patient reports he had a fall  last night, states, "Its the first fall i have had in 16 weeks! "  States he was asleep and woke up on the floor, unsure what happened, his has an abrasion on the top of his head.  He is unsure if he was trying to get up to go to the bathroom and was not fully awake. Denies any injuries.     Patient Stated Goals Patient reports he wants to be able to do more for himself and around the house.    Currently in Pain? No/denies   Pain Score 0-No pain                      OT Treatments/Exercises (OP) - 05/23/17 1629      ADLs   ADL Comments Patient seen for focus on stepping backwards motion to be able to back up to a chair, bed or surface.  Patient reports he is having difficulty with this task and tends to take "baby" steps and would like to work on larger steps to complete task.  Performed with chair in front of patient, first set holding onto chair and stepping forwards with cue and demo from therapist regarding size of step.  Instructed on a 1-2 step forwards with a 1-2 step backwards focusing on the same amplitude in size.  Advanced to completing without the chair in front of him for multiple repetitions, then transitioned stepping back to sit on the mat.  Task revisited throughout session during functional mobility tasks to ensure carryover of task from one context to another.  Postural exercises on the wall with verbal and tactile cues with 10 sec hold for 10 repetitions.      Neurological Re-education Exercises   Other Exercises 1 Patient seen for instruction of LSVT BIG exercises: LSVT Daily Session Maximal Daily Exercises: Sustained movements are designed to rescale the amplitude of movement output for generalization to daily functional activities. Performed as follows for 1 set of 10 repetitions each: Multi directional sustained movements- 1) Floor to ceiling, 2) Side to side. Multi directional Repetitive movements performed in standing and are designed to provide retraining effort  needed for sustained muscle activation in tasks Performed as follows: 3) Step and reach forward, 4) Step and Reach Backwards, 5) Step and reach sideways, 6) Rock and reach forward/backward, 7) Rock and reach sideways. Performed with adapted technique with use of chair for all exercises in standing, therapist cues in order to help guide and direct patient at home for home program. Patient required CGA assist for all exercises in standing, minimal verbal and tactile cues provided.  Patient confused this date with arm position for stepping to the side and stepping forwards exercise.                OT Education - 05/23/17 1636    Education provided Yes   Education Details stepping backwards, approaching seat, amplitude of movement   Person(s) Educated Patient   Methods Explanation;Demonstration;Verbal cues   Comprehension Verbal cues required;Returned demonstration;Verbalized understanding             OT Long Term Goals - 05/13/17 2349      OT LONG TERM GOAL #1   Title Patient will demonstrate increase right hand grip to be able to cut food with modified independence.    Time 12   Period Weeks   Status Partially Met     OT LONG TERM GOAL #2   Title Patient will complete donning long sleeve shirt with modified independence.   Baseline requires assist with long sleeves, can perform short sleeves.   Time 12   Period Weeks   Status Partially Met     OT LONG TERM GOAL #3   Title Patient will improve coordination to manage buttons on clothing with occasional assistance only.   Baseline requires assist with buttons each time   Time 12   Period Weeks   Status Partially Met     OT LONG TERM GOAL #4   Title Patient will complete managing pants after toileting with modified independence.    Baseline assist most days    Time 12   Period Weeks   Status On-going     OT LONG TERM GOAL #5   Title Patient will improve hand strength bilaterally to be able to don socks with modified  independence.    Baseline unable at evaluation   Time 4   Period Weeks   Status Achieved     OT LONG TERM GOAL #6   Title Patient will improve gait speed and endurance and be able to walk 1050 feet in 6 minutes to negotiate around the home and community safely in 4 weeks   Baseline 920 feet prior to LSVT BIG, 1195 feet at 16th visit   Time 4   Period Weeks   Status  Achieved     OT LONG TERM GOAL #7   Title Patient will complete HEP for maximal daily exercises with modified independence in 4 weeks   Time 12   Period Weeks   Status Partially Met     OT LONG TERM GOAL #8   Title Patient will transfer from sit to stand without the use of arms safely and independently from a variety of chairs/surfaces in 4 weeks.    Baseline difficulty from lower surfaces   Time 4   Period Weeks   Status Achieved     OT LONG TERM GOAL  #9   Baseline Patient will demonstrate decreased episodes of freezing of behaviors from score of 16 to score of less than 12.   Time 4   Period Weeks   Status Achieved               Plan - 05/23/17 1638    Clinical Impression Statement Patient reports a fall last night in the middle of the night.  He reports it was not due to freezing of gait however he reports he was asleep and then woke up falling.  He is unsure if he tried to get up to go to the bathroom in a groggy state, he did have a small abrasion on his head and he reports there was a small amount of blood on the wall but he denies any injuries.  He is planning to see his doctor today for a regular scheduled appt.  He has not had a fall in the last 4 months since starting LSVT BIG therapy.  Focused this date on stepping backwards to approach surfaces, patient normally tends to take short shuffling steps and he feels this is worse at times.  He performed well in a variety of contexts today with cues and demonstration from therapist.  Will continue to work towards these skills to reduce risk of falls.     Rehab Potential Good   Current Impairments/barriers affecting progress: positive: family support, negative: level of motivation, progressive disease   OT Frequency 2x / week   OT Duration 12 weeks   OT Treatment/Interventions Self-care/ADL training;Moist Heat;DME and/or AE instruction;Patient/family education;Therapeutic exercises;Balance training;Therapeutic exercise;Therapeutic activities;Neuromuscular education;Functional Mobility Training;Manual Therapy   Consulted and Agree with Plan of Care Patient;Family member/caregiver   Family Member Consulted wife      Patient will benefit from skilled therapeutic intervention in order to improve the following deficits and impairments:  Abnormal gait, Decreased coordination, Decreased range of motion, Difficulty walking, Decreased endurance, Decreased safety awareness, Decreased activity tolerance, Decreased balance, Impaired UE functional use, Pain, Decreased mobility, Decreased strength  Visit Diagnosis: Difficulty in walking, not elsewhere classified  Muscle weakness (generalized)  Unsteadiness on feet  Other lack of coordination  Abnormal posture    Problem List Patient Active Problem List   Diagnosis Date Noted  . BPH with urinary obstruction 11/14/2016  . Urinary frequency 10/10/2016  . Microscopic hematuria 10/10/2016  . Parkinson's syndrome ([place]) 06/14/2016  . Arthritis 06/14/2016  . Depression 06/14/2016  . Heartburn 06/14/2016  . Urinary incontinence 06/14/2016  . Skin lesion 06/14/2016  . Dementia 06/14/2016  . Bilateral edema of lower extremity 06/14/2016   Achilles Dunk, OTR/L, CLT  Lovett,Amy 05/23/2017, 4:42 PM  [place] MAIN [facility] SERVICES [address] [place], [place], [address] Phone: [phone]   Fax:  [phone]  Name: Luke Durham MRN: [id number] Date of Birth: 01-14-1933

## 2017-05-25 ENCOUNTER — Ambulatory Visit: Payer: Medicare Other | Attending: Neurology | Admitting: Occupational Therapy

## 2017-05-25 DIAGNOSIS — R262 Difficulty in walking, not elsewhere classified: Secondary | ICD-10-CM | POA: Diagnosis present

## 2017-05-25 DIAGNOSIS — R278 Other lack of coordination: Secondary | ICD-10-CM

## 2017-05-25 DIAGNOSIS — M6281 Muscle weakness (generalized): Secondary | ICD-10-CM | POA: Diagnosis present

## 2017-05-25 DIAGNOSIS — R2681 Unsteadiness on feet: Secondary | ICD-10-CM | POA: Diagnosis present

## 2017-05-25 DIAGNOSIS — R293 Abnormal posture: Secondary | ICD-10-CM | POA: Diagnosis present

## 2017-05-25 DIAGNOSIS — M25511 Pain in right shoulder: Secondary | ICD-10-CM | POA: Diagnosis present

## 2017-05-25 DIAGNOSIS — G8929 Other chronic pain: Secondary | ICD-10-CM | POA: Diagnosis present

## 2017-05-26 ENCOUNTER — Encounter: Payer: Self-pay | Admitting: Occupational Therapy

## 2017-05-26 NOTE — Therapy (Signed)
[place] MAIN [facility] SERVICES [address] [place], [place], [address] Phone: [phone]   Fax:  [phone]  Occupational Therapy Treatment  Patient Details  Name: Luke Durham MRN: [id number] Date of Birth: [birth year] Referring Provider: Marlyn Corporal  Encounter Date: 05/25/2017      OT End of Session - 05/26/17 1003    Visit Number 45   Number of Visits 85   Date for OT Re-Evaluation 06/01/17   Authorization Type Medicare visit 34   OT Start Time 1300   OT Stop Time 1400   OT Time Calculation (min) 60 min   Activity Tolerance Patient tolerated treatment well   Behavior During Therapy [facility] for tasks assessed/performed      Past Medical History:  Diagnosis Date  . Anxiety   . Arthritis   . Asthma    childhood asthma  . Cancer ([place]) 7 years ago   lymphoma, [place] (recent)  . Chronic kidney disease   . Colon polyps   . GERD (gastroesophageal reflux disease)   . History of kidney stones   . Parkinson disease [facility])     Past Surgical History:  Procedure Laterality Date  . COLON SURGERY    . CYSTOSCOPY WITH LITHOLAPAXY N/A 11/14/2016   Procedure: CYSTOSCOPY WITH LITHOLAPAXY;  Surgeon: Hollice Espy, MD;  Location: [facility] ORS;  Service: Urology;  Laterality: N/A;  . EYE SURGERY Bilateral    Cataract Extraction with IOL  . HERNIA REPAIR  20 years  . MOHS SURGERY    . SMALL INTESTINE SURGERY     Per patient 7 years  . TRANSURETHRAL RESECTION OF PROSTATE N/A 11/14/2016   Procedure: TRANSURETHRAL RESECTION OF THE PROSTATE (TURP);  Surgeon: Hollice Espy, MD;  Location: [facility] ORS;  Service: Urology;  Laterality: N/A;    There were no vitals filed for this visit.      Subjective Assessment - 05/26/17 1001    Subjective  Patient reports he went to the doctor and they adjusted some of his medications for Parkinson's also reports he has low vitamin D and will be taking a supplement over the next couple of months.  Patient reports he is  doing well, no additional issues after fall earlier in the week at home.    Pertinent History Patient reports he was diagnosed with Parkinson's disease about 10 plus years ago.  He reports a more recent decline in function in his daily activities in the last 6-12 months.  He has been seeing PT for the last couple months.   Patient Stated Goals Patient reports he wants to be able to do more for himself and around the house.    Currently in Pain? No/denies   Pain Score 0-No pain                      OT Treatments/Exercises (OP) - 05/26/17 1004      Neurological Re-education Exercises   Other Exercises 1 Patient seen for instruction of LSVT BIG exercises: LSVT Daily Session Maximal Daily Exercises: Sustained movements are designed to rescale the amplitude of movement output for generalization to daily functional activities. Performed as follows for 1 set of 10 repetitions each: Multi directional sustained movements- 1) Floor to ceiling, 2) Side to side. Multi directional Repetitive movements performed in standing and are designed to provide retraining effort needed for sustained muscle activation in tasks Performed as follows: 3) Step and reach forward, 4) Step and Reach Backwards, 5) Step and reach sideways, 6)  Rock and reach forward/backward, 7) Rock and reach sideways. Performed with adapted technique with use of chair for all exercises in standing, therapist cues in order to help guide and direct patient at home for home program. Patient required CGA assist to SBA for all exercises in standing, minimal verbal and tactile cues provided.    Other Exercises 2 Patient seen for functional mobility with use of rollator with CGA from therapist and moderate cuing for braking system operation.  Discussed safety issues with use of 2 walkers with opposing brake systems especially in times of freezing or need for quick reflex action.                 OT Education - 05/26/17 1002     Education provided Yes   Education Details use of rollator, safety, braking system   Person(s) Educated Patient   Methods Explanation;Demonstration;Verbal cues   Comprehension Returned demonstration;Verbal cues required;Verbalized understanding             OT Long Term Goals - 05/13/17 2349      OT LONG TERM GOAL #1   Title Patient will demonstrate increase right hand grip to be able to cut food with modified independence.    Time 12   Period Weeks   Status Partially Met     OT LONG TERM GOAL #2   Title Patient will complete donning long sleeve shirt with modified independence.   Baseline requires assist with long sleeves, can perform short sleeves.   Time 12   Period Weeks   Status Partially Met     OT LONG TERM GOAL #3   Title Patient will improve coordination to manage buttons on clothing with occasional assistance only.   Baseline requires assist with buttons each time   Time 12   Period Weeks   Status Partially Met     OT LONG TERM GOAL #4   Title Patient will complete managing pants after toileting with modified independence.    Baseline assist most days    Time 12   Period Weeks   Status On-going     OT LONG TERM GOAL #5   Title Patient will improve hand strength bilaterally to be able to don socks with modified independence.    Baseline unable at evaluation   Time 4   Period Weeks   Status Achieved     OT LONG TERM GOAL #6   Title Patient will improve gait speed and endurance and be able to walk 1050 feet in 6 minutes to negotiate around the home and community safely in 4 weeks   Baseline 920 feet prior to LSVT BIG, 1195 feet at 16th visit   Time 4   Period Weeks   Status Achieved     OT LONG TERM GOAL #7   Title Patient will complete HEP for maximal daily exercises with modified independence in 4 weeks   Time 12   Period Weeks   Status Partially Met     OT LONG TERM GOAL #8   Title Patient will transfer from sit to stand without the use of arms  safely and independently from a variety of chairs/surfaces in 4 weeks.    Baseline difficulty from lower surfaces   Time 4   Period Weeks   Status Achieved     OT LONG TERM GOAL  #9   Baseline Patient will demonstrate decreased episodes of freezing of behaviors from score of 16 to score of less than 12.   Time  4   Period Weeks   Status Achieved               Plan - 05/26/17 1003    Clinical Impression Statement Patient continues to inquire about use of a rollator, patient instructed on use this date as previously and discussed the safety issues with use.  The braking system is opposite of his U version walker at home.  The U version, the patient has to squeeze the handles to make it go forwards and let go of the handle to stop.  With a rollator the patient has to open hands to push and squeeze the brake to stop.  Therapist feels in a situation where the patient demonstrates freezing of gait or requires to stop abruptly, he may resort to braking method on the walker he uses the most, U version.  If this happens he could put himself at risk for falls especially with slow reflexes. Patient reports he understands but would still like to explore the potential use for a rollator.    Rehab Potential Good   Current Impairments/barriers affecting progress: positive: family support, negative: level of motivation, progressive disease   OT Frequency 2x / week   OT Duration 12 weeks   OT Treatment/Interventions Self-care/ADL training;Moist Heat;DME and/or AE instruction;Patient/family education;Therapeutic exercises;Balance training;Therapeutic exercise;Therapeutic activities;Neuromuscular education;Functional Mobility Training;Manual Therapy   Consulted and Agree with Plan of Care Patient;Family member/caregiver   Family Member Consulted wife      Patient will benefit from skilled therapeutic intervention in order to improve the following deficits and impairments:  Abnormal gait, Decreased  coordination, Decreased range of motion, Difficulty walking, Decreased endurance, Decreased safety awareness, Decreased activity tolerance, Decreased balance, Impaired UE functional use, Pain, Decreased mobility, Decreased strength  Visit Diagnosis: Difficulty in walking, not elsewhere classified  Muscle weakness (generalized)  Unsteadiness on feet  Other lack of coordination  Abnormal posture    Problem List Patient Active Problem List   Diagnosis Date Noted  . BPH with urinary obstruction 11/14/2016  . Urinary frequency 10/10/2016  . Microscopic hematuria 10/10/2016  . Parkinson's syndrome ([place]) 06/14/2016  . Arthritis 06/14/2016  . Depression 06/14/2016  . Heartburn 06/14/2016  . Urinary incontinence 06/14/2016  . Skin lesion 06/14/2016  . Dementia 06/14/2016  . Bilateral edema of lower extremity 06/14/2016   Achilles Dunk, OTR/L, CLT  Lovett,Amy 05/26/2017, 10:11 AM  [place] MAIN [facility] SERVICES [address] [place], [place], [address] Phone: [phone]   Fax:  [phone]  Name: Luke Durham MRN: [id number] Date of Birth: [birth year]

## 2017-05-30 ENCOUNTER — Ambulatory Visit: Payer: Medicare Other | Admitting: Occupational Therapy

## 2017-05-30 DIAGNOSIS — R278 Other lack of coordination: Secondary | ICD-10-CM

## 2017-05-30 DIAGNOSIS — R262 Difficulty in walking, not elsewhere classified: Secondary | ICD-10-CM | POA: Diagnosis not present

## 2017-05-30 DIAGNOSIS — M25511 Pain in right shoulder: Secondary | ICD-10-CM

## 2017-05-30 DIAGNOSIS — R2681 Unsteadiness on feet: Secondary | ICD-10-CM

## 2017-05-30 DIAGNOSIS — M6281 Muscle weakness (generalized): Secondary | ICD-10-CM

## 2017-05-30 DIAGNOSIS — R293 Abnormal posture: Secondary | ICD-10-CM

## 2017-05-30 DIAGNOSIS — G8929 Other chronic pain: Secondary | ICD-10-CM

## 2017-06-02 ENCOUNTER — Ambulatory Visit: Payer: Medicare Other | Admitting: Occupational Therapy

## 2017-06-02 DIAGNOSIS — M25511 Pain in right shoulder: Secondary | ICD-10-CM

## 2017-06-02 DIAGNOSIS — R278 Other lack of coordination: Secondary | ICD-10-CM

## 2017-06-02 DIAGNOSIS — R2681 Unsteadiness on feet: Secondary | ICD-10-CM

## 2017-06-02 DIAGNOSIS — M6281 Muscle weakness (generalized): Secondary | ICD-10-CM

## 2017-06-02 DIAGNOSIS — R293 Abnormal posture: Secondary | ICD-10-CM

## 2017-06-02 DIAGNOSIS — R262 Difficulty in walking, not elsewhere classified: Secondary | ICD-10-CM | POA: Diagnosis not present

## 2017-06-02 DIAGNOSIS — G8929 Other chronic pain: Secondary | ICD-10-CM

## 2017-06-05 ENCOUNTER — Ambulatory Visit: Payer: Medicare Other | Admitting: Occupational Therapy

## 2017-06-05 DIAGNOSIS — R2681 Unsteadiness on feet: Secondary | ICD-10-CM

## 2017-06-05 DIAGNOSIS — M25511 Pain in right shoulder: Secondary | ICD-10-CM

## 2017-06-05 DIAGNOSIS — R293 Abnormal posture: Secondary | ICD-10-CM

## 2017-06-05 DIAGNOSIS — R262 Difficulty in walking, not elsewhere classified: Secondary | ICD-10-CM

## 2017-06-05 DIAGNOSIS — M6281 Muscle weakness (generalized): Secondary | ICD-10-CM

## 2017-06-05 DIAGNOSIS — G8929 Other chronic pain: Secondary | ICD-10-CM

## 2017-06-05 DIAGNOSIS — R278 Other lack of coordination: Secondary | ICD-10-CM

## 2017-06-08 ENCOUNTER — Encounter: Payer: Self-pay | Admitting: Occupational Therapy

## 2017-06-08 NOTE — Therapy (Signed)
[place] MAIN [facility] SERVICES [address] [place], [place], [address] Phone: [phone]   Fax:  [phone]  Occupational Therapy Treatment/Reassessment  Patient Details  Name: Luke Durham MRN: [id number] Date of Birth: Oct 17, 1933 Referring Provider: Marlyn Corporal  Encounter Date: 06/02/2017      OT End of Session - 06/08/17 2104    Visit Number 47   Number of Visits 16   Date for OT Re-Evaluation 08/18/17   Authorization Type Medicare visit 64   OT Start Time 0949   OT Stop Time 1055   OT Time Calculation (min) 66 min   Activity Tolerance Patient tolerated treatment well   Behavior During Therapy [facility] for tasks assessed/performed      Past Medical History:  Diagnosis Date  . Anxiety   . Arthritis   . Asthma    childhood asthma  . Cancer ([place]) 7 years ago   lymphoma, [place] (recent)  . Chronic kidney disease   . Colon polyps   . GERD (gastroesophageal reflux disease)   . History of kidney stones   . Parkinson disease [facility])     Past Surgical History:  Procedure Laterality Date  . COLON SURGERY    . CYSTOSCOPY WITH LITHOLAPAXY N/A 11/14/2016   Procedure: CYSTOSCOPY WITH LITHOLAPAXY;  Surgeon: Hollice Espy, MD;  Location: [facility] ORS;  Service: Urology;  Laterality: N/A;  . EYE SURGERY Bilateral    Cataract Extraction with IOL  . HERNIA REPAIR  20 years  . MOHS SURGERY    . SMALL INTESTINE SURGERY     Per patient 7 years  . TRANSURETHRAL RESECTION OF PROSTATE N/A 11/14/2016   Procedure: TRANSURETHRAL RESECTION OF THE PROSTATE (TURP);  Surgeon: Hollice Espy, MD;  Location: [facility] ORS;  Service: Urology;  Laterality: N/A;    There were no vitals filed for this visit.      Subjective Assessment - 06/08/17 2057    Subjective  Patient reports he is pleased with his progress but feels he still needs to work on his daily exercises, freezing behaviors and taking larger steps especially when turning or stepping backwards.  Patient  reports one fall in the last 3-5 months.    Pertinent History Patient reports he was diagnosed with Parkinson's disease about 10 plus years ago.  He reports a more recent decline in function in his daily activities in the last 6-12 months.  He has been seeing PT for the last couple months.   Patient Stated Goals Patient reports he wants to be able to do more for himself and around the house.    Currently in Pain? Yes   Pain Score 1    Pain Location Shoulder   Pain Orientation Right   Pain Descriptors / Indicators Aching   Pain Onset More than a month ago   Pain Frequency Intermittent   Multiple Pain Sites No                      OT Treatments/Exercises (OP) - 06/08/17 2059      ADLs   ADL Comments Patient becoming more consistent at dressing himself and being able to don and doff shoes with tying.  Patient occasionally requires assistance when trying to get to an appt on time.  He is having decreased occurences of incontinence with going more on a schedule for bladder but still has some urge and difficulty at night.  Patient has demonstrated lower BP readings over the last 2 weeks, 94/62 this date.  Neurological Re-education Exercises   Other Exercises 1 Patient seen for instruction of LSVT BIG exercises: LSVT Daily Session Maximal Daily Exercises: Sustained movements are designed to rescale the amplitude of movement output for generalization to daily functional activities. Performed as follows for 1 set of 10 repetitions each: Multi directional sustained movements- 1) Floor to ceiling, 2) Side to side. Multi directional Repetitive movements performed in standing and are designed to provide retraining effort needed for sustained muscle activation in tasks Performed as follows: 3) Step and reach forward, 4) Step and Reach Backwards, 5) Step and reach sideways, 6) Rock and reach forward/backward, 7) Rock and reach sideways. Performed with adapted technique with use of chair for  all exercises in standing, therapist cues in order to help guide and direct patient at home for home program. Patient required CGA assist to SBA for all exercises in standing, minimal verbal and tactile cues provided. Patient continues to require verbal cues for the difference in stepping forward exercise versus stepping to the side and the arm positions for both.  Wife reports patient is performing exercises at home on a daily basis more consistently.   Other Exercises 2 Patient seen for reassessment of 6 minute walk test, 1035 feet.  5 times sit to stand 14 secs.   Cues for posture during functional mobility as well as during exercises.                 OT Education - 06/08/17 2103    Education provided Yes   Education Details goals, POC, freezing strategies   Person(s) Educated Patient   Methods Explanation;Demonstration;Verbal cues   Comprehension Verbal cues required;Returned demonstration;Verbalized understanding             OT Long Term Goals - 06/08/17 2106      OT LONG TERM GOAL #1   Title Patient will demonstrate increase right hand grip to be able to cut food with modified independence.    Time 12   Period Weeks   Status Partially Met     OT LONG TERM GOAL #2   Title Patient will complete donning long sleeve shirt with modified independence.   Baseline requires assist with long sleeves, can perform short sleeves.   Time 12   Period Weeks   Status Partially Met     OT LONG TERM GOAL #3   Title Patient will improve coordination to manage buttons on clothing with occasional assistance only.   Baseline requires assist with buttons each time   Time 12   Period Weeks   Status Partially Met     OT LONG TERM GOAL #4   Title Patient will complete managing pants after toileting with modified independence.    Baseline assist most days    Time 12   Period Weeks   Status Achieved     OT LONG TERM GOAL #5   Title Patient will improve hand strength bilaterally to  be able to don socks with modified independence.    Baseline unable at evaluation   Time 4   Period Weeks   Status Achieved     OT LONG TERM GOAL #6   Title Patient will improve gait speed and endurance and be able to walk 1050 feet in 6 minutes to negotiate around the home and community safely in 4 weeks   Baseline 3912 at recert 11/28/81   Time 4   Period Weeks   Status Revised     OT LONG TERM GOAL #7  Title Patient will complete HEP for maximal daily exercises with modified independence in 4 weeks   Time 12   Period Weeks   Status Partially Met     OT LONG TERM GOAL #8   Title Patient will transfer from sit to stand without the use of arms safely and independently from a variety of chairs/surfaces in 4 weeks.    Baseline difficulty from lower surfaces   Time 4   Period Weeks   Status Achieved     OT LONG TERM GOAL  #9   Baseline Patient will demonstrate decreased episodes of freezing of behaviors from score of 16 to score of less than 12.   Time 4   Period Weeks   Status Achieved               Plan - 06/08/17 2106    Clinical Impression Statement Patient reassessed this date, he is becoming more consistent with being able to complete self care tasks, occasional assistance with buttons and if he is working on a deadline to get to an appt on time, his wife may have to assist with care.  He had one fall since his last recertification which resulted in a small abrasion of the head but denies any other injury.  He feels it may have been related to lower blood pressure readings which he is currently monitoring.  He continues to progress with daily exercises and responds well to verbal cues but gets occasionally confused about arm and leg positioning for stepping forwards and sideways step.  He demonstrates decreased freezing of gait during his sessions and at home during the day but still has some freezing when getting up at night.  We have continued to work on this area to  reduce risk of falls. He continues to benefit from skilled OT to maximize his safety and independence in daily tasks.    Rehab Potential Good   Current Impairments/barriers affecting progress: positive: family support, negative: level of motivation, progressive disease   OT Frequency 2x / week   OT Duration 12 weeks   OT Treatment/Interventions Self-care/ADL training;Moist Heat;DME and/or AE instruction;Patient/family education;Therapeutic exercises;Balance training;Therapeutic exercise;Therapeutic activities;Neuromuscular education;Functional Mobility Training;Manual Therapy   Consulted and Agree with Plan of Care Patient;Family member/caregiver   Family Member Consulted wife      Patient will benefit from skilled therapeutic intervention in order to improve the following deficits and impairments:  Abnormal gait, Decreased coordination, Decreased range of motion, Difficulty walking, Decreased endurance, Decreased safety awareness, Decreased activity tolerance, Decreased balance, Impaired UE functional use, Pain, Decreased mobility, Decreased strength  Visit Diagnosis: Difficulty in walking, not elsewhere classified  Muscle weakness (generalized)  Unsteadiness on feet  Other lack of coordination  Abnormal posture  Chronic right shoulder pain    Problem List Patient Active Problem List   Diagnosis Date Noted  . BPH with urinary obstruction 11/14/2016  . Urinary frequency 10/10/2016  . Microscopic hematuria 10/10/2016  . Parkinson's syndrome ([place]) 06/14/2016  . Arthritis 06/14/2016  . Depression 06/14/2016  . Heartburn 06/14/2016  . Urinary incontinence 06/14/2016  . Skin lesion 06/14/2016  . Dementia 06/14/2016  . Bilateral edema of lower extremity 06/14/2016   Achilles Dunk, OTR/L, CLT  Lovett,Amy 06/08/2017, 9:13 PM  [place] MAIN [facility] SERVICES [address] [place], [place], [address] Phone: [phone]   Fax:   [phone]  Name: Luke Durham MRN: [id number] Date of Birth: [birth year]

## 2017-06-08 NOTE — Therapy (Signed)
[place] MAIN [facility] SERVICES [address] [place], [place], [address] Phone: [phone]   Fax:  [phone]  Occupational Therapy Treatment  Patient Details  Name: Luke Durham MRN: [id number] Date of Birth: 1933/03/23 Referring Provider: Marlyn Corporal  Encounter Date: 05/30/2017      OT End of Session - 06/08/17 2045    Visit Number 46   Number of Visits 54   Date for OT Re-Evaluation 06/01/17   Authorization Type Medicare visit 74   OT Start Time 1050   OT Stop Time 1151   OT Time Calculation (min) 61 min   Activity Tolerance Patient tolerated treatment well   Behavior During Therapy [facility] for tasks assessed/performed      Past Medical History:  Diagnosis Date  . Anxiety   . Arthritis   . Asthma    childhood asthma  . Cancer ([place]) 7 years ago   lymphoma, [place] (recent)  . Chronic kidney disease   . Colon polyps   . GERD (gastroesophageal reflux disease)   . History of kidney stones   . Parkinson disease [facility])     Past Surgical History:  Procedure Laterality Date  . COLON SURGERY    . CYSTOSCOPY WITH LITHOLAPAXY N/A 11/14/2016   Procedure: CYSTOSCOPY WITH LITHOLAPAXY;  Surgeon: Hollice Espy, MD;  Location: [facility] ORS;  Service: Urology;  Laterality: N/A;  . EYE SURGERY Bilateral    Cataract Extraction with IOL  . HERNIA REPAIR  20 years  . MOHS SURGERY    . SMALL INTESTINE SURGERY     Per patient 7 years  . TRANSURETHRAL RESECTION OF PROSTATE N/A 11/14/2016   Procedure: TRANSURETHRAL RESECTION OF THE PROSTATE (TURP);  Surgeon: Hollice Espy, MD;  Location: [facility] ORS;  Service: Urology;  Laterality: N/A;    There were no vitals filed for this visit.      Subjective Assessment - 06/08/17 2041    Subjective  Patient reports he is doing well, no falls.  He feels he does so much better at home when he comes to therapy at least 2 times a week and would like to continue to work towards current .    Pertinent History Patient  reports he was diagnosed with Parkinson's disease about 10 plus years ago.  He reports a more recent decline in function in his daily activities in the last 6-12 months.  He has been seeing PT for the last couple months.   Patient Stated Goals Patient reports he wants to be able to do more for himself and around the house.    Currently in Pain? No/denies   Pain Score 0-No pain   Multiple Pain Sites No                      OT Treatments/Exercises (OP) - 06/08/17 2042      ADLs   ADL Comments Patient seen for shoe tying with crossed leg method both right and left.  Cues for positioning for task.      Neurological Re-education Exercises   Other Exercises 1 Patient seen for instruction of LSVT BIG exercises: LSVT Daily Session Maximal Daily Exercises: Sustained movements are designed to rescale the amplitude of movement output for generalization to daily functional activities. Performed as follows for 1 set of 10 repetitions each: Multi directional sustained movements- 1) Floor to ceiling, 2) Side to side. Multi directional Repetitive movements performed in standing and are designed to provide retraining effort needed for sustained muscle activation  in tasks Performed as follows: 3) Step and reach forward, 4) Step and Reach Backwards, 5) Step and reach sideways, 6) Rock and reach forward/backward, 7) Rock and reach sideways. Performed with adapted technique with use of chair for all exercises in standing, therapist cues in order to help guide and direct patient at home for home program. Patient required CGA assist to SBA for all exercises in standing, minimal verbal and tactile cues provided.    Other Exercises 2 Patient seen for functional mobility skills with focus on initation and termination of gait on command, combined with turning behaviors.  Patient requires cues for turning feet as he navigates walker.                  OT Education - 06/08/17 2045    Education provided  Yes   Education Details turning behaviors with walker, cues for feet to lead task   Person(s) Educated Patient   Methods Explanation;Demonstration;Verbal cues;Tactile cues   Comprehension Verbalized understanding;Returned demonstration;Verbal cues required;Tactile cues required             OT Long Term Goals - 05/13/17 2349      OT LONG TERM GOAL #1   Title Patient will demonstrate increase right hand grip to be able to cut food with modified independence.    Time 12   Period Weeks   Status Partially Met     OT LONG TERM GOAL #2   Title Patient will complete donning long sleeve shirt with modified independence.   Baseline requires assist with long sleeves, can perform short sleeves.   Time 12   Period Weeks   Status Partially Met     OT LONG TERM GOAL #3   Title Patient will improve coordination to manage buttons on clothing with occasional assistance only.   Baseline requires assist with buttons each time   Time 12   Period Weeks   Status Partially Met     OT LONG TERM GOAL #4   Title Patient will complete managing pants after toileting with modified independence.    Baseline assist most days    Time 12   Period Weeks   Status On-going     OT LONG TERM GOAL #5   Title Patient will improve hand strength bilaterally to be able to don socks with modified independence.    Baseline unable at evaluation   Time 4   Period Weeks   Status Achieved     OT LONG TERM GOAL #6   Title Patient will improve gait speed and endurance and be able to walk 1050 feet in 6 minutes to negotiate around the home and community safely in 4 weeks   Baseline 920 feet prior to LSVT BIG, 1195 feet at 16th visit   Time 4   Period Weeks   Status Achieved     OT LONG TERM GOAL #7   Title Patient will complete HEP for maximal daily exercises with modified independence in 4 weeks   Time 12   Period Weeks   Status Partially Met     OT LONG TERM GOAL #8   Title Patient will transfer from sit  to stand without the use of arms safely and independently from a variety of chairs/surfaces in 4 weeks.    Baseline difficulty from lower surfaces   Time 4   Period Weeks   Status Achieved     OT LONG TERM GOAL  #9   Baseline Patient will demonstrate decreased episodes of  freezing of behaviors from score of 16 to score of less than 12.   Time 4   Period Weeks   Status Achieved               Plan - 06/08/17 2046    Clinical Impression Statement Patient continues to progress with functional mobility in the clinic and at home, he did have low BP at the doctors office, therefore BP checked each session, 93/56 this date.  Patient continues to monitor BP at home as well since the last fall he had at night, was not related to freezing of gait, patient reports he just "came to" and had fallen, unsure if it was related to BP.  Patient becoming more consistent with exercise performance however still requires occasional cues and CGA to SBA for safety.     Rehab Potential Good   Current Impairments/barriers affecting progress: positive: family support, negative: level of motivation, progressive disease   OT Frequency 2x / week   OT Duration 12 weeks   OT Treatment/Interventions Self-care/ADL training;Moist Heat;DME and/or AE instruction;Patient/family education;Therapeutic exercises;Balance training;Therapeutic exercise;Therapeutic activities;Neuromuscular education;Functional Mobility Training;Manual Therapy   Consulted and Agree with Plan of Care Patient;Family member/caregiver   Family Member Consulted wife      Patient will benefit from skilled therapeutic intervention in order to improve the following deficits and impairments:  Abnormal gait, Decreased coordination, Decreased range of motion, Difficulty walking, Decreased endurance, Decreased safety awareness, Decreased activity tolerance, Decreased balance, Impaired UE functional use, Pain, Decreased mobility, Decreased strength  Visit  Diagnosis: Difficulty in walking, not elsewhere classified  Muscle weakness (generalized)  Unsteadiness on feet  Other lack of coordination  Abnormal posture  Chronic right shoulder pain    Problem List Patient Active Problem List   Diagnosis Date Noted  . BPH with urinary obstruction 11/14/2016  . Urinary frequency 10/10/2016  . Microscopic hematuria 10/10/2016  . Parkinson's syndrome ([place]) 06/14/2016  . Arthritis 06/14/2016  . Depression 06/14/2016  . Heartburn 06/14/2016  . Urinary incontinence 06/14/2016  . Skin lesion 06/14/2016  . Dementia 06/14/2016  . Bilateral edema of lower extremity 06/14/2016   Achilles Dunk, OTR/L, CLT  Lovett,Amy 06/08/2017, 8:50 PM  [place] MAIN [facility] SERVICES [address] [place], [place], [address] Phone: [phone]   Fax:  [phone]  Name: Luke Durham MRN: [id number] Date of Birth: 08/26/1933

## 2017-06-09 ENCOUNTER — Ambulatory Visit: Payer: Medicare Other | Admitting: Occupational Therapy

## 2017-06-09 DIAGNOSIS — R2681 Unsteadiness on feet: Secondary | ICD-10-CM

## 2017-06-09 DIAGNOSIS — R262 Difficulty in walking, not elsewhere classified: Secondary | ICD-10-CM | POA: Diagnosis not present

## 2017-06-09 DIAGNOSIS — M6281 Muscle weakness (generalized): Secondary | ICD-10-CM

## 2017-06-09 DIAGNOSIS — R278 Other lack of coordination: Secondary | ICD-10-CM

## 2017-06-09 DIAGNOSIS — R293 Abnormal posture: Secondary | ICD-10-CM

## 2017-06-10 ENCOUNTER — Encounter: Payer: Self-pay | Admitting: Occupational Therapy

## 2017-06-10 NOTE — Therapy (Signed)
[place] MAIN [facility] SERVICES [address] [place], [place], [address] Phone: [phone]   Fax:  [phone]  Occupational Therapy Treatment  Patient Details  Name: Luke Durham MRN: [id number] Date of Birth: 07/27/1933 Referring Provider: Marlyn Corporal  Encounter Date: 06/05/2017      OT End of Session - 06/10/17 1547    Visit Number 48   Number of Visits 63   Date for OT Re-Evaluation 08/18/17   Authorization Type Medicare visit 12   OT Start Time 1055   OT Stop Time 1200   OT Time Calculation (min) 65 min   Activity Tolerance Patient tolerated treatment well   Behavior During Therapy [facility] for tasks assessed/performed      Past Medical History:  Diagnosis Date  . Anxiety   . Arthritis   . Asthma    childhood asthma  . Cancer ([place]) 7 years ago   lymphoma, [place] (recent)  . Chronic kidney disease   . Colon polyps   . GERD (gastroesophageal reflux disease)   . History of kidney stones   . Parkinson disease [facility])     Past Surgical History:  Procedure Laterality Date  . COLON SURGERY    . CYSTOSCOPY WITH LITHOLAPAXY N/A 11/14/2016   Procedure: CYSTOSCOPY WITH LITHOLAPAXY;  Surgeon: Hollice Espy, MD;  Location: [facility] ORS;  Service: Urology;  Laterality: N/A;  . EYE SURGERY Bilateral    Cataract Extraction with IOL  . HERNIA REPAIR  20 years  . MOHS SURGERY    . SMALL INTESTINE SURGERY     Per patient 7 years  . TRANSURETHRAL RESECTION OF PROSTATE N/A 11/14/2016   Procedure: TRANSURETHRAL RESECTION OF THE PROSTATE (TURP);  Surgeon: Hollice Espy, MD;  Location: [facility] ORS;  Service: Urology;  Laterality: N/A;    There were no vitals filed for this visit.      Subjective Assessment - 06/10/17 1545    Subjective  Patient reports he had a good weekend, was going out to eat with his wife and daughter but the restaurant they chose had steps to go up and he did not feel he could make it safely.  They drove all the way back from  [facility] and decided to pick up food from [facility].    Pertinent History Patient reports he was diagnosed with Parkinson's disease about 10 plus years ago.  He reports a more recent decline in function in his daily activities in the last 6-12 months.  He has been seeing PT for the last couple months.   Patient Stated Goals Patient reports he wants to be able to do more for himself and around the house.    Currently in Pain? No/denies   Pain Score 0-No pain                      OT Treatments/Exercises (OP) - 06/10/17 1551      ADLs   ADL Comments Patient seen for tying shoes with crossed leg method with cues and repeated trials of crossing each leg over knee for 5 reps each side.      Neurological Re-education Exercises   Other Exercises 1 Patient seen for instruction of LSVT BIG exercises: LSVT Daily Session Maximal Daily Exercises: Sustained movements are designed to rescale the amplitude of movement output for generalization to daily functional activities. Performed as follows for 1 set of 10 repetitions each: Multi directional sustained movements- 1) Floor to ceiling, 2) Side to side. Multi directional Repetitive  movements performed in standing and are designed to provide retraining effort needed for sustained muscle activation in tasks Performed as follows: 3) Step and reach forward, 4) Step and Reach Backwards, 5) Step and reach sideways, 6) Rock and reach forward/backward, 7) Rock and reach sideways. Performed with adapted technique with use of chair for all exercises in standing, therapist cues in order to help guide and direct patient at home for home program. Patient required CGA assist to SBA for all exercises in standing, minimal verbal and tactile cues provided. Patient continues to require verbal cues for the difference in stepping forward exercise versus stepping to the side and the arm positions for both   Other Exercises 2 Patient seen for functional mobility outdoors  on uneven surfaces with winding pathways and sloped areas with SBA for 2 trials of 300 feet each, cues for amplitude of steps.                 OT Education - 06/10/17 1546    Education provided Yes   Education Details night time toileting, mobility in small spaces   Person(s) Educated Patient   Methods Explanation;Demonstration;Verbal cues   Comprehension Verbal cues required;Returned demonstration;Verbalized understanding             OT Long Term Goals - 06/08/17 2106      OT LONG TERM GOAL #1   Title Patient will demonstrate increase right hand grip to be able to cut food with modified independence.    Time 12   Period Weeks   Status Partially Met     OT LONG TERM GOAL #2   Title Patient will complete donning long sleeve shirt with modified independence.   Baseline requires assist with long sleeves, can perform short sleeves.   Time 12   Period Weeks   Status Partially Met     OT LONG TERM GOAL #3   Title Patient will improve coordination to manage buttons on clothing with occasional assistance only.   Baseline requires assist with buttons each time   Time 12   Period Weeks   Status Partially Met     OT LONG TERM GOAL #4   Title Patient will complete managing pants after toileting with modified independence.    Baseline assist most days    Time 12   Period Weeks   Status Achieved     OT LONG TERM GOAL #5   Title Patient will improve hand strength bilaterally to be able to don socks with modified independence.    Baseline unable at evaluation   Time 4   Period Weeks   Status Achieved     OT LONG TERM GOAL #6   Title Patient will improve gait speed and endurance and be able to walk 1050 feet in 6 minutes to negotiate around the home and community safely in 4 weeks   Baseline 7253 at recert 03/30/43   Time 4   Period Weeks   Status Revised     OT LONG TERM GOAL #7   Title Patient will complete HEP for maximal daily exercises with modified  independence in 4 weeks   Time 12   Period Weeks   Status Partially Met     OT LONG TERM GOAL #8   Title Patient will transfer from sit to stand without the use of arms safely and independently from a variety of chairs/surfaces in 4 weeks.    Baseline difficulty from lower surfaces   Time 4   Period Weeks  Status Achieved     OT LONG TERM GOAL  #9   Baseline Patient will demonstrate decreased episodes of freezing of behaviors from score of 16 to score of less than 12.   Time 4   Period Weeks   Status Achieved               Plan - 06/10/17 1547    Clinical Impression Statement Patient continues to require cues for arm position with stepping forwards and sideways, tends to want to move his arms in the same direction as his feet with these exercises. Patient denies any falls in the last week and is continuing to check his blood pressure, 94/65 this date in the clinic.  Patient continues to work on exercises at home and still requires the adapted version with use of chair but is trying to incorporate both arms into exercises such as for rock and reach exercise.     Rehab Potential Good   Current Impairments/barriers affecting progress: positive: family support, negative: level of motivation, progressive disease   OT Frequency 2x / week   OT Duration 12 weeks   OT Treatment/Interventions Self-care/ADL training;Moist Heat;DME and/or AE instruction;Patient/family education;Therapeutic exercises;Balance training;Therapeutic exercise;Therapeutic activities;Neuromuscular education;Functional Mobility Training;Manual Therapy   Consulted and Agree with Plan of Care Patient;Family member/caregiver   Family Member Consulted wife      Patient will benefit from skilled therapeutic intervention in order to improve the following deficits and impairments:  Abnormal gait, Decreased coordination, Decreased range of motion, Difficulty walking, Decreased endurance, Decreased safety awareness,  Decreased activity tolerance, Decreased balance, Impaired UE functional use, Pain, Decreased mobility, Decreased strength  Visit Diagnosis: Difficulty in walking, not elsewhere classified  Muscle weakness (generalized)  Unsteadiness on feet  Other lack of coordination  Abnormal posture  Chronic right shoulder pain    Problem List Patient Active Problem List   Diagnosis Date Noted  . BPH with urinary obstruction 11/14/2016  . Urinary frequency 10/10/2016  . Microscopic hematuria 10/10/2016  . Parkinson's syndrome ([place]) 06/14/2016  . Arthritis 06/14/2016  . Depression 06/14/2016  . Heartburn 06/14/2016  . Urinary incontinence 06/14/2016  . Skin lesion 06/14/2016  . Dementia 06/14/2016  . Bilateral edema of lower extremity 06/14/2016   Achilles Dunk, OTR/L, CLT  Lovett,Amy 06/10/2017, 3:53 PM  [place] MAIN [facility] SERVICES [address] [place], [place], [address] Phone: [phone]   Fax:  [phone]  Name: Sumeet Geter MRN: [id number] Date of Birth: 07-31-1933

## 2017-06-10 NOTE — Therapy (Signed)
[place] MAIN [facility] SERVICES [address] [place], [place], [address] Phone: [phone]   Fax:  [phone]  Occupational Therapy Treatment  Patient Details  Name: Sylvia Kondracki MRN: [id number] Date of Birth: 1933/07/17 Referring Provider: Marlyn Corporal  Encounter Date: 06/09/2017      OT End of Session - 06/10/17 1600    Visit Number 64   Number of Visits 39   Date for OT Re-Evaluation 08/18/17   Authorization Type Medicare visit 84   OT Start Time 1101   OT Stop Time 1200   OT Time Calculation (min) 59 min   Activity Tolerance Patient tolerated treatment well   Behavior During Therapy [facility] for tasks assessed/performed      Past Medical History:  Diagnosis Date  . Anxiety   . Arthritis   . Asthma    childhood asthma  . Cancer ([place]) 7 years ago   lymphoma, [place] (recent)  . Chronic kidney disease   . Colon polyps   . GERD (gastroesophageal reflux disease)   . History of kidney stones   . Parkinson disease [facility])     Past Surgical History:  Procedure Laterality Date  . COLON SURGERY    . CYSTOSCOPY WITH LITHOLAPAXY N/A 11/14/2016   Procedure: CYSTOSCOPY WITH LITHOLAPAXY;  Surgeon: Hollice Espy, MD;  Location: [facility] ORS;  Service: Urology;  Laterality: N/A;  . EYE SURGERY Bilateral    Cataract Extraction with IOL  . HERNIA REPAIR  20 years  . MOHS SURGERY    . SMALL INTESTINE SURGERY     Per patient 7 years  . TRANSURETHRAL RESECTION OF PROSTATE N/A 11/14/2016   Procedure: TRANSURETHRAL RESECTION OF THE PROSTATE (TURP);  Surgeon: Hollice Espy, MD;  Location: [facility] ORS;  Service: Urology;  Laterality: N/A;    There were no vitals filed for this visit.      Subjective Assessment - 06/10/17 1556    Subjective  Patient reports he is planning to go out to eat with his daughter again on Saturday, not sure yet where they may go.  "This time we will call to find out if it is handicapped accessible."     Pertinent History Patient  reports he was diagnosed with Parkinson's disease about 10 plus years ago.  He reports a more recent decline in function in his daily activities in the last 6-12 months.  He has been seeing PT for the last couple months.   Patient Stated Goals Patient reports he wants to be able to do more for himself and around the house.    Currently in Pain? No/denies   Pain Score 0-No pain                      OT Treatments/Exercises (OP) - 06/10/17 1557      ADLs   ADL Comments Patient seen for focus on shoe tying with use of crossed leg method, difficulty at times holding leg in static position.  Performed with cues for 5 reps on each side while seated at the edge of the mat.  Sit to stand from a variety of surfaces and varying heights for 10 reps with cues for body position.  Patient also seen for stair negotiation and reciprocal stepping patterns with cues for placing whole foot on step and big turns at the top.      Neurological Re-education Exercises   Other Exercises 1 Patient seen for instruction of LSVT BIG exercises: LSVT Daily Session Maximal Daily Exercises:  Sustained movements are designed to rescale the amplitude of movement output for generalization to daily functional activities. Performed as follows for 1 set of 10 repetitions each: Multi directional sustained movements- 1) Floor to ceiling, 2) Side to side. Multi directional Repetitive movements performed in standing and are designed to provide retraining effort needed for sustained muscle activation in tasks Performed as follows: 3) Step and reach forward, 4) Step and Reach Backwards, 5) Step and reach sideways, 6) Rock and reach forward/backward, 7) Rock and reach sideways. Performed with adapted technique with use of chair for all exercises in standing, therapist cues in order to help guide and direct patient at home for home program. Patient required CGA assist to SBA for all exercises in standing, minimal verbal and tactile cues  provided. Patient continues to require verbal cues for the difference in stepping forward exercise versus stepping to the side and the arm positions for both                OT Education - 06/10/17 1600    Education provided Yes   Education Details shoe tying, amplitude of gait   Person(s) Educated Patient   Methods Explanation;Demonstration;Verbal cues   Comprehension Verbalized understanding;Returned demonstration;Verbal cues required             OT Long Term Goals - 06/08/17 2106      OT LONG TERM GOAL #1   Title Patient will demonstrate increase right hand grip to be able to cut food with modified independence.    Time 12   Period Weeks   Status Partially Met     OT LONG TERM GOAL #2   Title Patient will complete donning long sleeve shirt with modified independence.   Baseline requires assist with long sleeves, can perform short sleeves.   Time 12   Period Weeks   Status Partially Met     OT LONG TERM GOAL #3   Title Patient will improve coordination to manage buttons on clothing with occasional assistance only.   Baseline requires assist with buttons each time   Time 12   Period Weeks   Status Partially Met     OT LONG TERM GOAL #4   Title Patient will complete managing pants after toileting with modified independence.    Baseline assist most days    Time 12   Period Weeks   Status Achieved     OT LONG TERM GOAL #5   Title Patient will improve hand strength bilaterally to be able to don socks with modified independence.    Baseline unable at evaluation   Time 4   Period Weeks   Status Achieved     OT LONG TERM GOAL #6   Title Patient will improve gait speed and endurance and be able to walk 1050 feet in 6 minutes to negotiate around the home and community safely in 4 weeks   Baseline 4128 at recert 04/30/66   Time 4   Period Weeks   Status Revised     OT LONG TERM GOAL #7   Title Patient will complete HEP for maximal daily exercises with  modified independence in 4 weeks   Time 12   Period Weeks   Status Partially Met     OT LONG TERM GOAL #8   Title Patient will transfer from sit to stand without the use of arms safely and independently from a variety of chairs/surfaces in 4 weeks.    Baseline difficulty from lower surfaces   Time  4   Period Weeks   Status Achieved     OT LONG TERM GOAL  #9   Baseline Patient will demonstrate decreased episodes of freezing of behaviors from score of 16 to score of less than 12.   Time 4   Period Weeks   Status Achieved               Plan - 06/10/17 1601    Clinical Impression Statement Patient is becoming more consistent at reaching to his feet for tying shoes, sometimes lacks strength to pull laces tighter so they don't loosen prematurely.  Patient's blood pressure improved this date to 114/70.  Patient tends to reach down towards feet when performing sit to stand after several repetitions.  He requires cues shift body weight forwards and over feet but keep arms up.  Continues to require CGA to SBA for exercises in standing.  Patient continues to benefit from skilled OT to increase safety with mobility and reduce fall risk.    Rehab Potential Good   Current Impairments/barriers affecting progress: positive: family support, negative: level of motivation, progressive disease   OT Frequency 2x / week   OT Duration 12 weeks   OT Treatment/Interventions Self-care/ADL training;Moist Heat;DME and/or AE instruction;Patient/family education;Therapeutic exercises;Balance training;Therapeutic exercise;Therapeutic activities;Neuromuscular education;Functional Mobility Training;Manual Therapy   Consulted and Agree with Plan of Care Patient;Family member/caregiver      Patient will benefit from skilled therapeutic intervention in order to improve the following deficits and impairments:  Abnormal gait, Decreased coordination, Decreased range of motion, Difficulty walking, Decreased endurance,  Decreased safety awareness, Decreased activity tolerance, Decreased balance, Impaired UE functional use, Pain, Decreased mobility, Decreased strength  Visit Diagnosis: Difficulty in walking, not elsewhere classified  Muscle weakness (generalized)  Unsteadiness on feet  Other lack of coordination  Abnormal posture    Problem List Patient Active Problem List   Diagnosis Date Noted  . BPH with urinary obstruction 11/14/2016  . Urinary frequency 10/10/2016  . Microscopic hematuria 10/10/2016  . Parkinson's syndrome ([place]) 06/14/2016  . Arthritis 06/14/2016  . Depression 06/14/2016  . Heartburn 06/14/2016  . Urinary incontinence 06/14/2016  . Skin lesion 06/14/2016  . Dementia 06/14/2016  . Bilateral edema of lower extremity 06/14/2016   Achilles Dunk, OTR/L, CLT  Lakara Weiland 06/10/2017, 4:06 PM  [place] MAIN [facility] SERVICES [address] [place], [place], [address] Phone: [phone]   Fax:  [phone]  Name: Karem Tomaso MRN: [id number] Date of Birth: 01/24/1933

## 2017-06-12 ENCOUNTER — Ambulatory Visit: Payer: Medicare Other | Admitting: Occupational Therapy

## 2017-06-12 DIAGNOSIS — R278 Other lack of coordination: Secondary | ICD-10-CM

## 2017-06-12 DIAGNOSIS — M6281 Muscle weakness (generalized): Secondary | ICD-10-CM

## 2017-06-12 DIAGNOSIS — R2681 Unsteadiness on feet: Secondary | ICD-10-CM

## 2017-06-12 DIAGNOSIS — R293 Abnormal posture: Secondary | ICD-10-CM

## 2017-06-12 DIAGNOSIS — R262 Difficulty in walking, not elsewhere classified: Secondary | ICD-10-CM

## 2017-06-15 ENCOUNTER — Ambulatory Visit: Payer: Medicare Other | Admitting: Occupational Therapy

## 2017-06-15 DIAGNOSIS — R2681 Unsteadiness on feet: Secondary | ICD-10-CM

## 2017-06-15 DIAGNOSIS — R293 Abnormal posture: Secondary | ICD-10-CM

## 2017-06-15 DIAGNOSIS — R278 Other lack of coordination: Secondary | ICD-10-CM

## 2017-06-15 DIAGNOSIS — M6281 Muscle weakness (generalized): Secondary | ICD-10-CM

## 2017-06-15 DIAGNOSIS — R262 Difficulty in walking, not elsewhere classified: Secondary | ICD-10-CM

## 2017-06-16 ENCOUNTER — Encounter: Payer: Self-pay | Admitting: Occupational Therapy

## 2017-06-16 NOTE — Therapy (Signed)
[place] MAIN [facility] SERVICES [address] [place], [place], [address] Phone: [phone]   Fax:  [phone]  Occupational Therapy Treatment  Patient Details  Name: Luke Durham MRN: [id number] Date of Birth: 06/15/1933 Referring Provider: Marlyn Corporal  Encounter Date: 06/15/2017      OT End of Session - 06/16/17 2050    Visit Number 51   Number of Visits 71   Date for OT Re-Evaluation 08/18/17   Authorization Type Medicare visit 51 g codes   OT Start Time 0955   OT Stop Time 1059   OT Time Calculation (min) 64 min   Activity Tolerance Patient tolerated treatment well   Behavior During Therapy [facility] for tasks assessed/performed      Past Medical History:  Diagnosis Date  . Anxiety   . Arthritis   . Asthma    childhood asthma  . Cancer ([place]) 7 years ago   lymphoma, [place] (recent)  . Chronic kidney disease   . Colon polyps   . GERD (gastroesophageal reflux disease)   . History of kidney stones   . Parkinson disease [facility])     Past Surgical History:  Procedure Laterality Date  . COLON SURGERY    . CYSTOSCOPY WITH LITHOLAPAXY N/A 11/14/2016   Procedure: CYSTOSCOPY WITH LITHOLAPAXY;  Surgeon: Hollice Espy, MD;  Location: [facility] ORS;  Service: Urology;  Laterality: N/A;  . EYE SURGERY Bilateral    Cataract Extraction with IOL  . HERNIA REPAIR  20 years  . MOHS SURGERY    . SMALL INTESTINE SURGERY     Per patient 7 years  . TRANSURETHRAL RESECTION OF PROSTATE N/A 11/14/2016   Procedure: TRANSURETHRAL RESECTION OF THE PROSTATE (TURP);  Surgeon: Hollice Espy, MD;  Location: [facility] ORS;  Service: Urology;  Laterality: N/A;    There were no vitals filed for this visit.      Subjective Assessment - 06/16/17 2044    Subjective  Patient reports he is wanting to take a break for a few days of therapy, wants to change up his routine and see how he does at home with exercises.  Will plan to schedule him at the end of next week.      Pertinent History Patient reports he was diagnosed with Parkinson's disease about 10 plus years ago.  He reports a more recent decline in function in his daily activities in the last 6-12 months.  He has been seeing PT for the last couple months.   Patient Stated Goals Patient reports he wants to be able to do more for himself and around the house.    Currently in Pain? No/denies   Pain Score 0-No pain                      OT Treatments/Exercises (OP) - 06/16/17 2045      ADLs   ADL Comments Continued focus on moving in small spaces such as accessing the closet, bathroom and small parking space, cues for amplitude of turns, steps.  Cues for quarter turns.  Also instructed patient to have wife check space between car and curb, if walker does not have enough space to be used, she will need to repark so that patient has a larger area to move.  Patient demonstrates understanding. Shoe tying with verbal cues for multiple repetitions. Posture exercises on the wall with cues for shoulder retraction and head position.      Neurological Re-education Exercises   Other Exercises 1 Patient  seen for instruction of LSVT BIG exercises: LSVT Daily Session Maximal Daily Exercises: Sustained movements are designed to rescale the amplitude of movement output for generalization to daily functional activities. Performed as follows for 1 set of 10 repetitions each: Multi directional sustained movements- 1) Floor to ceiling, 2) Side to side. Multi directional Repetitive movements performed in standing and are designed to provide retraining effort needed for sustained muscle activation in tasks Performed as follows: 3) Step and reach forward, 4) Step and Reach Backwards, 5) Step and reach sideways, 6) Rock and reach forward/backward, 7) Rock and reach sideways. Performed with adapted technique with use of chair for all exercises in standing, therapist cues in order to help guide and direct patient at home for  home program. Patient required CGA assist to SBA for all exercises in standing, minimal verbal and tactile cues provided. Patient continues to require verbal cues for the difference in stepping forward exercise versus stepping to the side and the arm positions for both   Other Exercises 2 Functional mobility deferred this date due to low BP.  Taken 3 times during session 87/54, 85/62, 87/52                OT Education - 06/16/17 2049    Education provided Yes   Education Details symptoms of low blood pressure, hydration   Person(s) Educated Patient;Spouse   Methods Explanation;Demonstration;Verbal cues   Comprehension Verbal cues required;Returned demonstration;Verbalized understanding             OT Long Term Goals - 06/16/17 0857      OT LONG TERM GOAL #1   Title Patient will demonstrate increase right hand grip to be able to cut food with modified independence.    Baseline occasional assist required.   Time 12   Period Weeks   Status Partially Met     OT LONG TERM GOAL #2   Title Patient will complete donning long sleeve shirt with modified independence.   Baseline requires assist with long sleeves, can perform short sleeves.   Time 12   Period Weeks   Status Partially Met     OT LONG TERM GOAL #3   Title Patient will improve coordination to manage buttons on clothing with occasional assistance only.   Baseline requires assist with buttons each time   Time 12   Period Weeks   Status Partially Met     OT LONG TERM GOAL #4   Title Patient will complete managing pants after toileting with modified independence.    Baseline assist most days    Time 12   Period Weeks   Status Achieved     OT LONG TERM GOAL #5   Title Patient will improve hand strength bilaterally to be able to don socks with modified independence.    Baseline unable at evaluation   Time 4   Period Weeks   Status Achieved     OT LONG TERM GOAL #6   Title Patient will improve gait speed  and endurance and be able to walk 1050 feet in 6 minutes to negotiate around the home and community safely in 4 weeks   Baseline 9470 at recert 06/30/27   Time 4   Period Weeks   Status Revised     OT LONG TERM GOAL #7   Title Patient will complete HEP for maximal daily exercises with modified independence in 4 weeks   Time 12   Period Weeks   Status Partially Met  OT LONG TERM GOAL #8   Title Patient will transfer from sit to stand without the use of arms safely and independently from a variety of chairs/surfaces in 4 weeks.    Baseline difficulty from lower surfaces   Time 4   Period Weeks   Status Achieved     OT LONG TERM GOAL  #9   Baseline Patient will demonstrate decreased episodes of freezing of behaviors from score of 16 to score of less than 12.   Time 4   Period Weeks   Status Achieved               Plan - 06/16/17 2050    Clinical Impression Statement Monitored patient's blood pressure this date during session.  His BP was lower than in recent sessions.  He is also monitoring this at home and denies any lightheadness or dizziness this date.  He reports he is trying to stay hydrated but does not do well with drinking much water.  Discussed the symptoms of low BP and benefits of hydration.  Patient reports understanding.  Recommend he follow up with MD regarding BP readings and monitor symptoms, especially since he had an episode last year of low blood pressure which ended in a hospitalization.    Rehab Potential Good   Current Impairments/barriers affecting progress: positive: family support, negative: level of motivation, progressive disease   OT Frequency 2x / week   OT Duration 12 weeks   OT Treatment/Interventions Self-care/ADL training;Moist Heat;DME and/or AE instruction;Patient/family education;Therapeutic exercises;Balance training;Therapeutic exercise;Therapeutic activities;Neuromuscular education;Functional Mobility Training;Manual Therapy   Consulted  and Agree with Plan of Care Patient;Family member/caregiver   Family Member Consulted wife      Patient will benefit from skilled therapeutic intervention in order to improve the following deficits and impairments:  Abnormal gait, Decreased coordination, Decreased range of motion, Difficulty walking, Decreased endurance, Decreased safety awareness, Decreased activity tolerance, Decreased balance, Impaired UE functional use, Pain, Decreased mobility, Decreased strength  Visit Diagnosis: Difficulty in walking, not elsewhere classified  Muscle weakness (generalized)  Unsteadiness on feet  Other lack of coordination  Abnormal posture    Problem List Patient Active Problem List   Diagnosis Date Noted  . BPH with urinary obstruction 11/14/2016  . Urinary frequency 10/10/2016  . Microscopic hematuria 10/10/2016  . Parkinson's syndrome ([place]) 06/14/2016  . Arthritis 06/14/2016  . Depression 06/14/2016  . Heartburn 06/14/2016  . Urinary incontinence 06/14/2016  . Skin lesion 06/14/2016  . Dementia 06/14/2016  . Bilateral edema of lower extremity 06/14/2016   Achilles Dunk, OTR/L, CLT  Lovett,Amy 06/16/2017, 8:54 PM  [place] MAIN [facility] SERVICES [address] [place], [place], [address] Phone: [phone]   Fax:  [phone]  Name: Luke Durham MRN: [id number] Date of Birth: November 04, 1932

## 2017-06-16 NOTE — Therapy (Signed)
[place] MAIN [facility] SERVICES [address] [place], [place], [address] Phone: [phone]   Fax:  [phone]  Occupational Therapy Treatment/Progress Report  Patient Details  Name: Luke Durham MRN: [id number] Date of Birth: 1933/02/09 Referring Provider: Marlyn Corporal  Encounter Date: 06/12/2017      OT End of Session - 06/16/17 0856    Visit Number 50   Number of Visits 1   Date for OT Re-Evaluation 08/18/17   Authorization Type Medicare visit 50 g codes   OT Start Time 1106   OT Stop Time 1205   OT Time Calculation (min) 59 min   Activity Tolerance Patient tolerated treatment well   Behavior During Therapy [facility] for tasks assessed/performed      Past Medical History:  Diagnosis Date  . Anxiety   . Arthritis   . Asthma    childhood asthma  . Cancer ([place]) 7 years ago   lymphoma, [place] (recent)  . Chronic kidney disease   . Colon polyps   . GERD (gastroesophageal reflux disease)   . History of kidney stones   . Parkinson disease [facility])     Past Surgical History:  Procedure Laterality Date  . COLON SURGERY    . CYSTOSCOPY WITH LITHOLAPAXY N/A 11/14/2016   Procedure: CYSTOSCOPY WITH LITHOLAPAXY;  Surgeon: Hollice Espy, MD;  Location: [facility] ORS;  Service: Urology;  Laterality: N/A;  . EYE SURGERY Bilateral    Cataract Extraction with IOL  . HERNIA REPAIR  20 years  . MOHS SURGERY    . SMALL INTESTINE SURGERY     Per patient 7 years  . TRANSURETHRAL RESECTION OF PROSTATE N/A 11/14/2016   Procedure: TRANSURETHRAL RESECTION OF THE PROSTATE (TURP);  Surgeon: Hollice Espy, MD;  Location: [facility] ORS;  Service: Urology;  Laterality: N/A;    There were no vitals filed for this visit.      Subjective Assessment - 06/16/17 0854    Subjective  Patient reports he enjoyed the weekend and went out to eat with his wife and daughter.  Has been doing exercises at home but there is not a good place for him to walk especially if his wife does  not accompany him.    Pertinent History Patient reports he was diagnosed with Parkinson's disease about 10 plus years ago.  He reports a more recent decline in function in his daily activities in the last 6-12 months.  He has been seeing PT for the last couple months.   Patient Stated Goals Patient reports he wants to be able to do more for himself and around the house.    Currently in Pain? No/denies   Pain Score 0-No pain                      OT Treatments/Exercises (OP) - 06/16/17 1431      ADLs   ADL Comments Patient seen for navigating in small spaces, patient is having increased difficulty.  Patient recently had episode in which wife parked close to curb and it took patient almost 30 minutes to get out of the small space.  Patient requires cues for size of steps and techniques for turning.  Multiple trials completed with varying contexts.      Neurological Re-education Exercises   Other Exercises 1 Patient seen for instruction of LSVT BIG exercises: LSVT Daily Session Maximal Daily Exercises: Sustained movements are designed to rescale the amplitude of movement output for generalization to daily functional activities. Performed as follows  for 1 set of 10 repetitions each: Multi directional sustained movements- 1) Floor to ceiling, 2) Side to side. Multi directional Repetitive movements performed in standing and are designed to provide retraining effort needed for sustained muscle activation in tasks Performed as follows: 3) Step and reach forward, 4) Step and Reach Backwards, 5) Step and reach sideways, 6) Rock and reach forward/backward, 7) Rock and reach sideways. Performed with adapted technique with use of chair for all exercises in standing, therapist cues in order to help guide and direct patient at home for home program. Patient required CGA assist to SBA for all exercises in standing, minimal verbal and tactile cues provided. Patient continues to require verbal cues for the  difference in stepping forward exercise versus stepping to the side and the arm positions for both   Other Exercises 2 5 repetitions of crossed leg method right and left to work towards reaching shoes to tie.                 OT Education - 06/16/17 0855    Education provided Yes   Education Details moving in small spaces   Person(s) Educated Patient   Methods Explanation;Demonstration;Verbal cues   Comprehension Verbal cues required;Returned demonstration;Verbalized understanding             OT Long Term Goals - 06/16/17 0857      OT LONG TERM GOAL #1   Title Patient will demonstrate increase right hand grip to be able to cut food with modified independence.    Baseline occasional assist required.   Time 12   Period Weeks   Status Partially Met     OT LONG TERM GOAL #2   Title Patient will complete donning long sleeve shirt with modified independence.   Baseline requires assist with long sleeves, can perform short sleeves.   Time 12   Period Weeks   Status Partially Met     OT LONG TERM GOAL #3   Title Patient will improve coordination to manage buttons on clothing with occasional assistance only.   Baseline requires assist with buttons each time   Time 12   Period Weeks   Status Partially Met     OT LONG TERM GOAL #4   Title Patient will complete managing pants after toileting with modified independence.    Baseline assist most days    Time 12   Period Weeks   Status Achieved     OT LONG TERM GOAL #5   Title Patient will improve hand strength bilaterally to be able to don socks with modified independence.    Baseline unable at evaluation   Time 4   Period Weeks   Status Achieved     OT LONG TERM GOAL #6   Title Patient will improve gait speed and endurance and be able to walk 1050 feet in 6 minutes to negotiate around the home and community safely in 4 weeks   Baseline 0092 at recert 12/25/98   Time 4   Period Weeks   Status Revised     OT LONG  TERM GOAL #7   Title Patient will complete HEP for maximal daily exercises with modified independence in 4 weeks   Time 12   Period Weeks   Status Partially Met     OT LONG TERM GOAL #8   Title Patient will transfer from sit to stand without the use of arms safely and independently from a variety of chairs/surfaces in 4 weeks.    Baseline  difficulty from lower surfaces   Time 4   Period Weeks   Status Achieved     OT LONG TERM GOAL  #9   Baseline Patient will demonstrate decreased episodes of freezing of behaviors from score of 16 to score of less than 12.   Time 4   Period Weeks   Status Achieved               Plan - 06/16/17 0856    Clinical Impression Statement Patient continues to progress is all areas.  Focused this date on navigating in small spaces.  Patient having difficulty recently getting out of car in narrow space which took 30 minutes.  Patient tends to freeze and has difficulty taking larger scale amplitude steps.  Patient has improved with shoe tying in the last couple weeks.  He responds well to verbal cues for posture and exercises but is still reliant on these to complete his daily exercises. Patient continues to benefit from skilled OT to maximize safety and independence in daily tasks.    Rehab Potential Good   Current Impairments/barriers affecting progress: positive: family support, negative: level of motivation, progressive disease   OT Frequency 2x / week   OT Duration 12 weeks   OT Treatment/Interventions Self-care/ADL training;Moist Heat;DME and/or AE instruction;Patient/family education;Therapeutic exercises;Balance training;Therapeutic exercise;Therapeutic activities;Neuromuscular education;Functional Mobility Training;Manual Therapy   Consulted and Agree with Plan of Care Patient;Family member/caregiver   Family Member Consulted wife      Patient will benefit from skilled therapeutic intervention in order to improve the following deficits and  impairments:  Abnormal gait, Decreased coordination, Decreased range of motion, Difficulty walking, Decreased endurance, Decreased safety awareness, Decreased activity tolerance, Decreased balance, Impaired UE functional use, Pain, Decreased mobility, Decreased strength  Visit Diagnosis: Difficulty in walking, not elsewhere classified  Muscle weakness (generalized)  Unsteadiness on feet  Other lack of coordination  Abnormal posture    Problem List Patient Active Problem List   Diagnosis Date Noted  . BPH with urinary obstruction 11/14/2016  . Urinary frequency 10/10/2016  . Microscopic hematuria 10/10/2016  . Parkinson's syndrome ([place]) 06/14/2016  . Arthritis 06/14/2016  . Depression 06/14/2016  . Heartburn 06/14/2016  . Urinary incontinence 06/14/2016  . Skin lesion 06/14/2016  . Dementia 06/14/2016  . Bilateral edema of lower extremity 06/14/2016   Achilles Dunk, OTR/L, CLT  Roseana Rhine 06/16/2017, 2:47 PM  [place] MAIN [facility] SERVICES [address] [place], [place], [address] Phone: [phone]   Fax:  [phone]  Name: Luke Durham MRN: [id number] Date of Birth: 10-07-1933

## 2017-06-23 ENCOUNTER — Encounter: Payer: Self-pay | Admitting: Occupational Therapy

## 2017-06-23 ENCOUNTER — Ambulatory Visit: Payer: Medicare Other | Admitting: Occupational Therapy

## 2017-06-23 VITALS — BP 107/71

## 2017-06-23 DIAGNOSIS — R262 Difficulty in walking, not elsewhere classified: Secondary | ICD-10-CM

## 2017-06-23 DIAGNOSIS — R2681 Unsteadiness on feet: Secondary | ICD-10-CM

## 2017-06-23 DIAGNOSIS — R293 Abnormal posture: Secondary | ICD-10-CM

## 2017-06-23 DIAGNOSIS — R278 Other lack of coordination: Secondary | ICD-10-CM

## 2017-06-23 DIAGNOSIS — M6281 Muscle weakness (generalized): Secondary | ICD-10-CM

## 2017-06-23 NOTE — Therapy (Signed)
[place] MAIN [facility] SERVICES [address] [place], [place], [address] Phone: [phone]   Fax:  [phone]  Occupational Therapy Treatment  Patient Details  Name: Luke Durham MRN: [id number] Date of Birth: 1933/09/26 Referring Provider: Marlyn Corporal  Encounter Date: 06/23/2017      OT End of Session - 06/23/17 1355    Visit Number 52   Number of Visits 30   Date for OT Re-Evaluation 08/18/17   Authorization Type Medicare visit 52 g codes   OT Start Time 1100   OT Stop Time 1200   OT Time Calculation (min) 60 min   Activity Tolerance Patient tolerated treatment well   Behavior During Therapy [facility] for tasks assessed/performed      Past Medical History:  Diagnosis Date  . Anxiety   . Arthritis   . Asthma    childhood asthma  . Cancer ([place]) 7 years ago   lymphoma, [place] (recent)  . Chronic kidney disease   . Colon polyps   . GERD (gastroesophageal reflux disease)   . History of kidney stones   . Parkinson disease [facility])     Past Surgical History:  Procedure Laterality Date  . COLON SURGERY    . CYSTOSCOPY WITH LITHOLAPAXY N/A 11/14/2016   Procedure: CYSTOSCOPY WITH LITHOLAPAXY;  Surgeon: Hollice Espy, MD;  Location: [facility] ORS;  Service: Urology;  Laterality: N/A;  . EYE SURGERY Bilateral    Cataract Extraction with IOL  . HERNIA REPAIR  20 years  . MOHS SURGERY    . SMALL INTESTINE SURGERY     Per patient 7 years  . TRANSURETHRAL RESECTION OF PROSTATE N/A 11/14/2016   Procedure: TRANSURETHRAL RESECTION OF THE PROSTATE (TURP);  Surgeon: Hollice Espy, MD;  Location: [facility] ORS;  Service: Urology;  Laterality: N/A;    Vitals:   06/23/17 1348  BP: 107/71        Subjective Assessment - 06/23/17 1348    Subjective  Patient reports he got a new rollator in the last week, asking if therapist can adjust the arms.  Wife reports patient has done well with the new assistive device and it was easier to get into 2 of the restaurants  they went to this week.    Pertinent History Patient reports he was diagnosed with Parkinson's disease about 10 plus years ago.  He reports a more recent decline in function in his daily activities in the last 6-12 months.  He has been seeing PT for the last couple months.   Patient Stated Goals Patient reports he wants to be able to do more for himself and around the house.    Currently in Pain? No/denies   Pain Score 0-No pain                      OT Treatments/Exercises (OP) - 06/23/17 0001      ADLs   ADL Comments Patient seen with new assistive device (rollator) to access small spaces particularly bathroom.  Patient able to turn with greater ease with use of swivel wheels on rollator.  Recommend patient use device at night when ambulating to the bathroom.  Wife reports patient often goes to the bathroom without AD and this is when he has more incidence of freezing.       Neurological Re-education Exercises   Other Exercises 1 Patient seen for instruction of LSVT BIG exercises: LSVT Daily Session Maximal Daily Exercises: Sustained movements are designed to rescale the amplitude of movement  output for generalization to daily functional activities. Performed as follows for 1 set of 10 repetitions each: Multi directional sustained movements- 1) Floor to ceiling, 2) Side to side. Multi directional Repetitive movements performed in standing and are designed to provide retraining effort needed for sustained muscle activation in tasks Performed as follows: 3) Step and reach forward, 4) Step and Reach Backwards, 5) Step and reach sideways, 6) Rock and reach forward/backward, 7) Rock and reach sideways. Performed with adapted technique with use of chair for all exercises in standing, therapist cues in order to help guide and direct patient at home for home program. Patient required CGA assist to SBA for all exercises in standing, minimal verbal and tactile cues provided. Patient continues to  require verbal cues for the difference in stepping forward exercise versus stepping to the side and the arm positions for both.  Patient reports he performed exercises at home over the last week however needed slightly increased cues this date for daily exercises.    Other Exercises 2 Functional mobility skills outdoors with use of new rollator, 1 trial of 600 feet with cues to stay closer to the walker, posture and amplitude of steps.                 OT Education - 06/23/17 1355    Education provided Yes   Education Details use of new rollator, daily exercises   Person(s) Educated Patient   Methods Explanation;Demonstration;Verbal cues   Comprehension Verbal cues required;Returned demonstration;Verbalized understanding             OT Long Term Goals - 06/16/17 0857      OT LONG TERM GOAL #1   Title Patient will demonstrate increase right hand grip to be able to cut food with modified independence.    Baseline occasional assist required.   Time 12   Period Weeks   Status Partially Met     OT LONG TERM GOAL #2   Title Patient will complete donning long sleeve shirt with modified independence.   Baseline requires assist with long sleeves, can perform short sleeves.   Time 12   Period Weeks   Status Partially Met     OT LONG TERM GOAL #3   Title Patient will improve coordination to manage buttons on clothing with occasional assistance only.   Baseline requires assist with buttons each time   Time 12   Period Weeks   Status Partially Met     OT LONG TERM GOAL #4   Title Patient will complete managing pants after toileting with modified independence.    Baseline assist most days    Time 12   Period Weeks   Status Achieved     OT LONG TERM GOAL #5   Title Patient will improve hand strength bilaterally to be able to don socks with modified independence.    Baseline unable at evaluation   Time 4   Period Weeks   Status Achieved     OT LONG TERM GOAL #6   Title  Patient will improve gait speed and endurance and be able to walk 1050 feet in 6 minutes to negotiate around the home and community safely in 4 weeks   Baseline 6226 at recert 12/24/33   Time 4   Period Weeks   Status Revised     OT LONG TERM GOAL #7   Title Patient will complete HEP for maximal daily exercises with modified independence in 4 weeks   Time 12  Period Weeks   Status Partially Met     OT LONG TERM GOAL #8   Title Patient will transfer from sit to stand without the use of arms safely and independently from a variety of chairs/surfaces in 4 weeks.    Baseline difficulty from lower surfaces   Time 4   Period Weeks   Status Achieved     OT LONG TERM GOAL  #9   Baseline Patient will demonstrate decreased episodes of freezing of behaviors from score of 16 to score of less than 12.   Time 4   Period Weeks   Status Achieved               Plan - 06/23/17 1356    Clinical Impression Statement Patient purchased new rollator this past week.  It has the opposite braking system mechanism than his U version walker at home which poses a risk for confusion especially with quick or reflexive movements.  Recommend patient try not to switch back and forth between the 2 for consistency.  Patient to start using the rollator at night to go to the bathroom.  Patient does have greater ease with turning, decreased freezing of gait with rollator versus regular walker. Patient does require some cues for staying close to walker and positioning of self. Continue to work towards goals and with new assistive device to reduce fall risk and improve safety and independence.    Rehab Potential Good   Current Impairments/barriers affecting progress: positive: family support, negative: level of motivation, progressive disease   OT Frequency 2x / week   OT Duration 12 weeks   OT Treatment/Interventions Self-care/ADL training;Moist Heat;DME and/or AE instruction;Patient/family education;Therapeutic  exercises;Balance training;Therapeutic exercise;Therapeutic activities;Neuromuscular education;Functional Mobility Training;Manual Therapy   Consulted and Agree with Plan of Care Patient;Family member/caregiver   Family Member Consulted wife      Patient will benefit from skilled therapeutic intervention in order to improve the following deficits and impairments:  Abnormal gait, Decreased coordination, Decreased range of motion, Difficulty walking, Decreased endurance, Decreased safety awareness, Decreased activity tolerance, Decreased balance, Impaired UE functional use, Pain, Decreased mobility, Decreased strength  Visit Diagnosis: Difficulty in walking, not elsewhere classified  Muscle weakness (generalized)  Unsteadiness on feet  Other lack of coordination  Abnormal posture    Problem List Patient Active Problem List   Diagnosis Date Noted  . BPH with urinary obstruction 11/14/2016  . Urinary frequency 10/10/2016  . Microscopic hematuria 10/10/2016  . Parkinson's syndrome ([place]) 06/14/2016  . Arthritis 06/14/2016  . Depression 06/14/2016  . Heartburn 06/14/2016  . Urinary incontinence 06/14/2016  . Skin lesion 06/14/2016  . Dementia 06/14/2016  . Bilateral edema of lower extremity 06/14/2016   Achilles Dunk, OTR/L, CLT  Lovett,Amy 06/23/2017, 2:00 PM  [place] MAIN [facility] SERVICES [address] [place], [place], [address] Phone: [phone]   Fax:  [phone]  Name: Luke Durham MRN: [id number] Date of Birth: 1933-08-28

## 2017-06-27 ENCOUNTER — Encounter: Payer: Self-pay | Admitting: Occupational Therapy

## 2017-06-27 ENCOUNTER — Ambulatory Visit: Payer: Medicare Other | Attending: Neurology | Admitting: Occupational Therapy

## 2017-06-27 DIAGNOSIS — R293 Abnormal posture: Secondary | ICD-10-CM | POA: Diagnosis present

## 2017-06-27 DIAGNOSIS — M6281 Muscle weakness (generalized): Secondary | ICD-10-CM

## 2017-06-27 DIAGNOSIS — R2681 Unsteadiness on feet: Secondary | ICD-10-CM

## 2017-06-27 DIAGNOSIS — R278 Other lack of coordination: Secondary | ICD-10-CM

## 2017-06-27 DIAGNOSIS — R262 Difficulty in walking, not elsewhere classified: Secondary | ICD-10-CM

## 2017-06-29 NOTE — Therapy (Signed)
[place] MAIN [facility] SERVICES [address] [place], [place], [address] Phone: [phone]   Fax:  [phone]  Occupational Therapy Treatment  Patient Details  Name: Luke Durham MRN: [id number] Date of Birth: 04/26/1933 Referring Provider: Marlyn Corporal  Encounter Date: 06/27/2017    Past Medical History:  Diagnosis Date  . Anxiety   . Arthritis   . Asthma    childhood asthma  . Cancer ([place]) 7 years ago   lymphoma, [place] (recent)  . Chronic kidney disease   . Colon polyps   . GERD (gastroesophageal reflux disease)   . History of kidney stones   . Parkinson disease [facility])     Past Surgical History:  Procedure Laterality Date  . COLON SURGERY    . CYSTOSCOPY WITH LITHOLAPAXY N/A 11/14/2016   Procedure: CYSTOSCOPY WITH LITHOLAPAXY;  Surgeon: Hollice Espy, MD;  Location: [facility] ORS;  Service: Urology;  Laterality: N/A;  . EYE SURGERY Bilateral    Cataract Extraction with IOL  . HERNIA REPAIR  20 years  . MOHS SURGERY    . SMALL INTESTINE SURGERY     Per patient 7 years  . TRANSURETHRAL RESECTION OF PROSTATE N/A 11/14/2016   Procedure: TRANSURETHRAL RESECTION OF THE PROSTATE (TURP);  Surgeon: Hollice Espy, MD;  Location: [facility] ORS;  Service: Urology;  Laterality: N/A;    There were no vitals filed for this visit.      Subjective Assessment - 06/29/17 0838    Subjective  Patient reports he has been using his new rollator for all ambulation in the home and for outside activities rather than switching from one type of walker to another (U version vs rollator).  He reports he is doing well, feels it is easier to get in and out of restaurants and places in the community.     Patient is accompained by: Family member   Pertinent History Patient reports he was diagnosed with Parkinson's disease about 10 plus years ago.  He reports a more recent decline in function in his daily activities in the last 6-12 months.  He has been seeing PT for the  last couple months.   Patient Stated Goals Patient reports he wants to be able to do more for himself and around the house.    Currently in Pain? No/denies   Pain Score 0-No pain                      OT Treatments/Exercises (OP) - 06/29/17 0830      ADLs   ADL Comments Patient seen for shoe tying, managing stairs, turning in small spaces with cues for BIG turning behaviors.  Posture on the wall in standing with cues for shoulder retraction.       Neurological Re-education Exercises   Other Exercises 1 Patient seen for instruction of LSVT BIG exercises: LSVT Daily Session Maximal Daily Exercises: Sustained movements are designed to rescale the amplitude of movement output for generalization to daily functional activities. Performed as follows for 1 set of 10 repetitions each: Multi directional sustained movements- 1) Floor to ceiling, 2) Side to side. Multi directional Repetitive movements performed in standing and are designed to provide retraining effort needed for sustained muscle activation in tasks Performed as follows: 3) Step and reach forward, 4) Step and Reach Backwards, 5) Step and reach sideways, 6) Rock and reach forward/backward, 7) Rock and reach sideways. Performed with adapted technique with use of chair for all exercises in standing, therapist cues in  order to help guide and direct patient at home for home program. Patient required CGA assist to SBA for all exercises in standing, minimal verbal and tactile cues provided.    Other Exercises 2 Focused this date on use of new rollator walker with emphasis on turning, managing thresholds, stop and go patterns with use of braking patterns.  Functional mobility for 2 sets of 300 feet.  Cues when stopping to keep walker close or to pull back towards him to compensate.                      OT Long Term Goals - 06/16/17 0857      OT LONG TERM GOAL #1   Title Patient will demonstrate increase right hand grip to  be able to cut food with modified independence.    Baseline occasional assist required.   Time 12   Period Weeks   Status Partially Met     OT LONG TERM GOAL #2   Title Patient will complete donning long sleeve shirt with modified independence.   Baseline requires assist with long sleeves, can perform short sleeves.   Time 12   Period Weeks   Status Partially Met     OT LONG TERM GOAL #3   Title Patient will improve coordination to manage buttons on clothing with occasional assistance only.   Baseline requires assist with buttons each time   Time 12   Period Weeks   Status Partially Met     OT LONG TERM GOAL #4   Title Patient will complete managing pants after toileting with modified independence.    Baseline assist most days    Time 12   Period Weeks   Status Achieved     OT LONG TERM GOAL #5   Title Patient will improve hand strength bilaterally to be able to don socks with modified independence.    Baseline unable at evaluation   Time 4   Period Weeks   Status Achieved     OT LONG TERM GOAL #6   Title Patient will improve gait speed and endurance and be able to walk 1050 feet in 6 minutes to negotiate around the home and community safely in 4 weeks   Baseline 3500 at recert 06/26/80   Time 4   Period Weeks   Status Revised     OT LONG TERM GOAL #7   Title Patient will complete HEP for maximal daily exercises with modified independence in 4 weeks   Time 12   Period Weeks   Status Partially Met     OT LONG TERM GOAL #8   Title Patient will transfer from sit to stand without the use of arms safely and independently from a variety of chairs/surfaces in 4 weeks.    Baseline difficulty from lower surfaces   Time 4   Period Weeks   Status Achieved     OT LONG TERM GOAL  #9   Baseline Patient will demonstrate decreased episodes of freezing of behaviors from score of 16 to score of less than 12.   Time 4   Period Weeks   Status Achieved               Plan  - 06/29/17 0839    Clinical Impression Statement Patient has continued to utilize his new rollator walker at home and when going out in the community over the last week.  His wife reports increased ease of getting in and out of  restaurants and outings in the last week. Patient occasionally hesitates and the walker gets placed too far in front of him placing him at risk for falls.  Focused this date on stop and go patterns and walker placement.  Recommend patient pull walker back to him rather than attempting to step forward in the walker when he is experiencing hesitation or freezing of gait.  Patient required cues for technique but was able to show some evidence of carryover by end of session.  Will revisit this next session to recheck status and see if there is any carryover from session to session.    Rehab Potential Good   Current Impairments/barriers affecting progress: positive: family support, negative: level of motivation, progressive disease   OT Frequency 2x / week   OT Duration 12 weeks   OT Treatment/Interventions Self-care/ADL training;Moist Heat;DME and/or AE instruction;Patient/family education;Therapeutic exercises;Balance training;Therapeutic exercise;Therapeutic activities;Neuromuscular education;Functional Mobility Training;Manual Therapy   Consulted and Agree with Plan of Care Patient;Family member/caregiver   Family Member Consulted wife      Patient will benefit from skilled therapeutic intervention in order to improve the following deficits and impairments:  Abnormal gait, Decreased coordination, Decreased range of motion, Difficulty walking, Decreased endurance, Decreased safety awareness, Decreased activity tolerance, Decreased balance, Impaired UE functional use, Pain, Decreased mobility, Decreased strength  Visit Diagnosis: Difficulty in walking, not elsewhere classified  Muscle weakness (generalized)  Unsteadiness on feet  Other lack of coordination  Abnormal  posture    Problem List Patient Active Problem List   Diagnosis Date Noted  . BPH with urinary obstruction 11/14/2016  . Urinary frequency 10/10/2016  . Microscopic hematuria 10/10/2016  . Parkinson's syndrome ([place]) 06/14/2016  . Arthritis 06/14/2016  . Depression 06/14/2016  . Heartburn 06/14/2016  . Urinary incontinence 06/14/2016  . Skin lesion 06/14/2016  . Dementia 06/14/2016  . Bilateral edema of lower extremity 06/14/2016   Achilles Dunk, OTR/L, CLT  Lovett,Amy 06/29/2017, 8:39 AM  [place] MAIN [facility] SERVICES [address] [place], [place], [address] Phone: [phone]   Fax:  [phone]  Name: Luke Durham MRN: [id number] Date of Birth: 1933-09-22

## 2017-06-30 ENCOUNTER — Ambulatory Visit: Payer: Medicare Other | Admitting: Occupational Therapy

## 2017-06-30 DIAGNOSIS — R262 Difficulty in walking, not elsewhere classified: Secondary | ICD-10-CM

## 2017-06-30 DIAGNOSIS — R2681 Unsteadiness on feet: Secondary | ICD-10-CM

## 2017-06-30 DIAGNOSIS — R278 Other lack of coordination: Secondary | ICD-10-CM

## 2017-06-30 DIAGNOSIS — M6281 Muscle weakness (generalized): Secondary | ICD-10-CM

## 2017-06-30 DIAGNOSIS — R293 Abnormal posture: Secondary | ICD-10-CM

## 2017-07-03 ENCOUNTER — Ambulatory Visit: Payer: Medicare Other | Admitting: Occupational Therapy

## 2017-07-03 DIAGNOSIS — R278 Other lack of coordination: Secondary | ICD-10-CM

## 2017-07-03 DIAGNOSIS — R262 Difficulty in walking, not elsewhere classified: Secondary | ICD-10-CM

## 2017-07-03 DIAGNOSIS — R2681 Unsteadiness on feet: Secondary | ICD-10-CM

## 2017-07-03 DIAGNOSIS — R293 Abnormal posture: Secondary | ICD-10-CM

## 2017-07-03 DIAGNOSIS — M6281 Muscle weakness (generalized): Secondary | ICD-10-CM

## 2017-07-07 ENCOUNTER — Encounter: Payer: Self-pay | Admitting: Occupational Therapy

## 2017-07-07 ENCOUNTER — Ambulatory Visit: Payer: Medicare Other | Admitting: Occupational Therapy
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
## 2017-07-07 NOTE — Therapy (Signed)
[place] MAIN [facility] SERVICES [address] [place], [place], [address] Phone: [phone]   Fax:  [phone]  Occupational Therapy Treatment  Patient Details  Name: Luke Durham MRN: [id number] Date of Birth: 05/06/1933 Referring Provider: Marlyn Corporal  Encounter Date: 06/30/2017   BP measured 96/22 this date      OT End of Session - 07/07/17 1047    Visit Number 54   Number of Visits 33   Date for OT Re-Evaluation 08/18/17   Authorization Type Medicare visit 54 g codes   OT Start Time 1050   OT Stop Time 1151   OT Time Calculation (min) 61 min   Activity Tolerance Patient tolerated treatment well   Behavior During Therapy [facility] for tasks assessed/performed      Past Medical History:  Diagnosis Date  . Anxiety   . Arthritis   . Asthma    childhood asthma  . Cancer ([place]) 7 years ago   lymphoma, [place] (recent)  . Chronic kidney disease   . Colon polyps   . GERD (gastroesophageal reflux disease)   . History of kidney stones   . Parkinson disease [facility])     Past Surgical History:  Procedure Laterality Date  . COLON SURGERY    . CYSTOSCOPY WITH LITHOLAPAXY N/A 11/14/2016   Procedure: CYSTOSCOPY WITH LITHOLAPAXY;  Surgeon: Hollice Espy, MD;  Location: [facility] ORS;  Service: Urology;  Laterality: N/A;  . EYE SURGERY Bilateral    Cataract Extraction with IOL  . HERNIA REPAIR  20 years  . MOHS SURGERY    . SMALL INTESTINE SURGERY     Per patient 7 years  . TRANSURETHRAL RESECTION OF PROSTATE N/A 11/14/2016   Procedure: TRANSURETHRAL RESECTION OF THE PROSTATE (TURP);  Surgeon: Hollice Espy, MD;  Location: [facility] ORS;  Service: Urology;  Laterality: N/A;    There were no vitals filed for this visit.      Subjective Assessment - 07/07/17 1040    Subjective  Patient reports he continues to perform well at home with the new rollator and is using it all the time.    Patient is accompained by: Family member   Pertinent History Patient  reports he was diagnosed with Parkinson's disease about 10 plus years ago.  He reports a more recent decline in function in his daily activities in the last 6-12 months.  He has been seeing PT for the last couple months.   Patient Stated Goals Patient reports he wants to be able to do more for himself and around the house.    Currently in Pain? No/denies   Pain Score 0-No pain                      OT Treatments/Exercises (OP) - 07/07/17 1040      ADLs   ADL Comments Patient seen for reaching to shoes for shoe tying, managing stairs with big turn at the landing, turning in small spaces with cues for BIG turning behaviors, and Posture on the wall in standing with cues for shoulder retraction and head position.     Neurological Re-education Exercises   Other Exercises 1 Patient seen for instruction of LSVT BIG exercises: LSVT Daily Session Maximal Daily Exercises: Sustained movements are designed to rescale the amplitude of movement output for generalization to daily functional activities. Performed as follows for 1 set of 10 repetitions each: Multi directional sustained movements- 1) Floor to ceiling, 2) Side to side. Multi directional Repetitive movements performed  in standing and are designed to provide retraining effort needed for sustained muscle activation in tasks Performed as follows: 3) Step and reach forward, 4) Step and Reach Backwards, 5) Step and reach sideways, 6) Rock and reach forward/backward, 7) Rock and reach sideways.  Advanced to performing without use of chair with therapist providing CGA to min assist, incorporating both arms in tasks performing in a standard method.  Patient is aware he will need to use chair at home with exercises for now due to decreased balance.    Other Exercises 2 Continued to focus on use of rollator managing in small spaces with cues for positioning of feet and walker.  Patient requires cues at times to keep walker in proper position to  decrease risk of falls.                 OT Education - 07/07/17 1046    Education provided Yes   Education Details positioning with rollator especially during turns   Person(s) Educated Patient   Methods Explanation;Demonstration;Verbal cues   Comprehension Verbal cues required;Returned demonstration;Verbalized understanding             OT Long Term Goals - 06/16/17 0857      OT LONG TERM GOAL #1   Title Patient will demonstrate increase right hand grip to be able to cut food with modified independence.    Baseline occasional assist required.   Time 12   Period Weeks   Status Partially Met     OT LONG TERM GOAL #2   Title Patient will complete donning long sleeve shirt with modified independence.   Baseline requires assist with long sleeves, can perform short sleeves.   Time 12   Period Weeks   Status Partially Met     OT LONG TERM GOAL #3   Title Patient will improve coordination to manage buttons on clothing with occasional assistance only.   Baseline requires assist with buttons each time   Time 12   Period Weeks   Status Partially Met     OT LONG TERM GOAL #4   Title Patient will complete managing pants after toileting with modified independence.    Baseline assist most days    Time 12   Period Weeks   Status Achieved     OT LONG TERM GOAL #5   Title Patient will improve hand strength bilaterally to be able to don socks with modified independence.    Baseline unable at evaluation   Time 4   Period Weeks   Status Achieved     OT LONG TERM GOAL #6   Title Patient will improve gait speed and endurance and be able to walk 1050 feet in 6 minutes to negotiate around the home and community safely in 4 weeks   Baseline 5573 at recert 11/25/00   Time 4   Period Weeks   Status Revised     OT LONG TERM GOAL #7   Title Patient will complete HEP for maximal daily exercises with modified independence in 4 weeks   Time 12   Period Weeks   Status Partially  Met     OT LONG TERM GOAL #8   Title Patient will transfer from sit to stand without the use of arms safely and independently from a variety of chairs/surfaces in 4 weeks.    Baseline difficulty from lower surfaces   Time 4   Period Weeks   Status Achieved     OT LONG TERM GOAL  #  9   Baseline Patient will demonstrate decreased episodes of freezing of behaviors from score of 16 to score of less than 12.   Time 4   Period Weeks   Status Achieved               Plan - 07/07/17 1047    Clinical Impression Statement Patient has made good progress on being consistent with performance of adapted version of exercises and balance has improved slightly.  Advanced this date to performing in a more standard version of the exercises without the use of the chair for balance.  One loss of balance for the patient this date with moderate assist for recovery.  Will continue to work towards challenging patient more with use of standard version in the clinic with therapist assist however patient will continue to perform adapted exercises at home and he is aware and demonstrates understanding of this.     Rehab Potential Good   Current Impairments/barriers affecting progress: positive: family support, negative: level of motivation, progressive disease   OT Frequency 2x / week   OT Duration 12 weeks   OT Treatment/Interventions Self-care/ADL training;Moist Heat;DME and/or AE instruction;Patient/family education;Therapeutic exercises;Balance training;Therapeutic exercise;Therapeutic activities;Neuromuscular education;Functional Mobility Training;Manual Therapy   Consulted and Agree with Plan of Care Patient;Family member/caregiver   Family Member Consulted wife      Patient will benefit from skilled therapeutic intervention in order to improve the following deficits and impairments:  Abnormal gait, Decreased coordination, Decreased range of motion, Difficulty walking, Decreased endurance, Decreased  safety awareness, Decreased activity tolerance, Decreased balance, Impaired UE functional use, Pain, Decreased mobility, Decreased strength  Visit Diagnosis: Difficulty in walking, not elsewhere classified  Muscle weakness (generalized)  Unsteadiness on feet  Other lack of coordination  Abnormal posture    Problem List Patient Active Problem List   Diagnosis Date Noted  . BPH with urinary obstruction 11/14/2016  . Urinary frequency 10/10/2016  . Microscopic hematuria 10/10/2016  . Parkinson's syndrome ([place]) 06/14/2016  . Arthritis 06/14/2016  . Depression 06/14/2016  . Heartburn 06/14/2016  . Urinary incontinence 06/14/2016  . Skin lesion 06/14/2016  . Dementia 06/14/2016  . Bilateral edema of lower extremity 06/14/2016   Achilles Dunk, OTR/L, CLT  Lovett,Amy 07/07/2017, 11:02 AM  [place] MAIN [facility] SERVICES [address] [place], [place], [address] Phone: [phone]   Fax:  [phone]  Name: Luke Durham MRN: [id number] Date of Birth: 1932-12-08

## 2017-07-10 ENCOUNTER — Ambulatory Visit: Payer: Medicare Other | Admitting: Occupational Therapy

## 2017-07-10 DIAGNOSIS — R2681 Unsteadiness on feet: Secondary | ICD-10-CM

## 2017-07-10 DIAGNOSIS — R278 Other lack of coordination: Secondary | ICD-10-CM

## 2017-07-10 DIAGNOSIS — R262 Difficulty in walking, not elsewhere classified: Secondary | ICD-10-CM | POA: Diagnosis not present

## 2017-07-10 DIAGNOSIS — R293 Abnormal posture: Secondary | ICD-10-CM

## 2017-07-10 DIAGNOSIS — M6281 Muscle weakness (generalized): Secondary | ICD-10-CM

## 2017-07-13 ENCOUNTER — Ambulatory Visit: Payer: Medicare Other | Admitting: Occupational Therapy

## 2017-07-13 DIAGNOSIS — R262 Difficulty in walking, not elsewhere classified: Secondary | ICD-10-CM

## 2017-07-13 DIAGNOSIS — R2681 Unsteadiness on feet: Secondary | ICD-10-CM

## 2017-07-13 DIAGNOSIS — R293 Abnormal posture: Secondary | ICD-10-CM

## 2017-07-13 DIAGNOSIS — M6281 Muscle weakness (generalized): Secondary | ICD-10-CM

## 2017-07-13 DIAGNOSIS — R278 Other lack of coordination: Secondary | ICD-10-CM

## 2017-07-18 DIAGNOSIS — G20 Parkinson's disease: Secondary | ICD-10-CM

## 2017-07-18 DIAGNOSIS — F0280 Dementia in other diseases classified elsewhere without behavioral disturbance: Secondary | ICD-10-CM | POA: Insufficient documentation

## 2017-07-20 ENCOUNTER — Ambulatory Visit: Payer: Medicare Other | Admitting: Occupational Therapy

## 2017-07-20 DIAGNOSIS — R2681 Unsteadiness on feet: Secondary | ICD-10-CM

## 2017-07-20 DIAGNOSIS — R293 Abnormal posture: Secondary | ICD-10-CM

## 2017-07-20 DIAGNOSIS — R278 Other lack of coordination: Secondary | ICD-10-CM

## 2017-07-20 DIAGNOSIS — M6281 Muscle weakness (generalized): Secondary | ICD-10-CM

## 2017-07-20 DIAGNOSIS — R262 Difficulty in walking, not elsewhere classified: Secondary | ICD-10-CM

## 2017-07-21 ENCOUNTER — Encounter: Payer: Self-pay | Admitting: Occupational Therapy

## 2017-07-21 NOTE — Therapy (Signed)
[place] MAIN [facility] SERVICES [address] [place], [place], [address] Phone: [phone]   Fax:  [phone]  Occupational Therapy Treatment  Patient Details  Name: Luke Durham MRN: [id number] Date of Birth: Apr 28, 1933 Referring Provider: Marlyn Corporal  Encounter Date: 07/10/2017      OT End of Session - 07/21/17 1711    Visit Number 56   Number of Visits 49   Date for OT Re-Evaluation 08/18/17   Authorization Type Medicare visit 56 g codes   OT Start Time Dec 25, 1253   OT Stop Time 1358   OT Time Calculation (min) 63 min   Activity Tolerance Patient tolerated treatment well   Behavior During Therapy [facility] for tasks assessed/performed      Past Medical History:  Diagnosis Date  . Anxiety   . Arthritis   . Asthma    childhood asthma  . Cancer ([place]) 7 years ago   lymphoma, [place] (recent)  . Chronic kidney disease   . Colon polyps   . GERD (gastroesophageal reflux disease)   . History of kidney stones   . Parkinson disease [facility])     Past Surgical History:  Procedure Laterality Date  . COLON SURGERY    . CYSTOSCOPY WITH LITHOLAPAXY N/A 11/14/2016   Procedure: CYSTOSCOPY WITH LITHOLAPAXY;  Surgeon: Hollice Espy, MD;  Location: [facility] ORS;  Service: Urology;  Laterality: N/A;  . EYE SURGERY Bilateral    Cataract Extraction with IOL  . HERNIA REPAIR  20 years  . MOHS SURGERY    . SMALL INTESTINE SURGERY     Per patient 7 years  . TRANSURETHRAL RESECTION OF PROSTATE N/A 11/14/2016   Procedure: TRANSURETHRAL RESECTION OF THE PROSTATE (TURP);  Surgeon: Hollice Espy, MD;  Location: [facility] ORS;  Service: Urology;  Laterality: N/A;    There were no vitals filed for this visit.      Subjective Assessment - 07/21/17 1710    Subjective  Patient reports he has continued to do well at home with new rollator. He denies any falls. He does say that his freezing of gate has diminished but is still present at night occasionally.   Pertinent History  Patient reports he was diagnosed with Parkinson's disease about 10 plus years ago.  He reports a more recent decline in function in his daily activities in the last 6-12 months.  He has been seeing PT for the last couple months.   Patient Stated Goals Patient reports he wants to be able to do more for himself and around the house.    Currently in Pain? No/denies   Pain Score 0-No pain                      OT Treatments/Exercises (OP) - 07/21/17 1713      ADLs   ADL Comments Patient seen for reaching to shoes for shoe tying, managing stairs with big turn at the landing, turning in small spaces with cues for BIG turning behaviors, and Posture on the wall in standing with cues for shoulder retraction and head position.     Neurological Re-education Exercises   Other Exercises 1 Patient seen for instruction of LSVT BIG exercises: LSVT Daily Session Maximal Daily Exercises: Sustained movements are designed to rescale the amplitude of movement output for generalization to daily functional activities. Performed as follows for 1 set of 10 repetitions each: Multi directional sustained movements- 1) Floor to ceiling, 2) Side to side. Multi directional Repetitive movements performed in standing  and are designed to provide retraining effort needed for sustained muscle activation in tasks Performed as follows: 3) Step and reach forward, 4) Step and Reach Backwards, 5) Step and reach sideways, 6) Rock and reach forward/backward, 7) Rock and reach sideways. Advanced to performing without use of chair with therapist providing CGA to min assist, incorporating both arms in tasks performing in a standard method.   Other Exercises 2 Focus on use of rollator and positioning of self for safety and optimal use. Patient requires repetition, moderate cues and therapist demo.                  OT Education - 07/21/17 1711    Education provided Yes   Education Details rollator, turning, positioning of  self in relation to walker   Person(s) Educated Patient   Methods Explanation;Demonstration;Verbal cues   Comprehension Verbal cues required;Returned demonstration;Verbalized understanding             OT Long Term Goals - 06/16/17 0857      OT LONG TERM GOAL #1   Title Patient will demonstrate increase right hand grip to be able to cut food with modified independence.    Baseline occasional assist required.   Time 12   Period Weeks   Status Partially Met     OT LONG TERM GOAL #2   Title Patient will complete donning long sleeve shirt with modified independence.   Baseline requires assist with long sleeves, can perform short sleeves.   Time 12   Period Weeks   Status Partially Met     OT LONG TERM GOAL #3   Title Patient will improve coordination to manage buttons on clothing with occasional assistance only.   Baseline requires assist with buttons each time   Time 12   Period Weeks   Status Partially Met     OT LONG TERM GOAL #4   Title Patient will complete managing pants after toileting with modified independence.    Baseline assist most days    Time 12   Period Weeks   Status Achieved     OT LONG TERM GOAL #5   Title Patient will improve hand strength bilaterally to be able to don socks with modified independence.    Baseline unable at evaluation   Time 4   Period Weeks   Status Achieved     OT LONG TERM GOAL #6   Title Patient will improve gait speed and endurance and be able to walk 1050 feet in 6 minutes to negotiate around the home and community safely in 4 weeks   Baseline 5809 at recert 07/01/32   Time 4   Period Weeks   Status Revised     OT LONG TERM GOAL #7   Title Patient will complete HEP for maximal daily exercises with modified independence in 4 weeks   Time 12   Period Weeks   Status Partially Met     OT LONG TERM GOAL #8   Title Patient will transfer from sit to stand without the use of arms safely and independently from a variety of  chairs/surfaces in 4 weeks.    Baseline difficulty from lower surfaces   Time 4   Period Weeks   Status Achieved     OT LONG TERM GOAL  #9   Baseline Patient will demonstrate decreased episodes of freezing of behaviors from score of 16 to score of less than 12.   Time 4   Period Weeks   Status  Achieved               Plan - 07/21/17 1712    Clinical Impression Statement Patient has continued to progress with functional mobility using Rollator at home and in the community. He is experiencing decreased episodes of freezing of gait especially during the day and with less frequency at night than previously. He does require min to moderate cues for positioning of self in relation to rollator when turning. Recommend patient try to keep his toes pointed underneath the walker at all times when turning and if he finds himself outside of the rollator then he needs to take a step usually to the right to reposition himself. He is able to demonstrate understanding of this technique for safety and will try to implement more consistently at home. Will continue to work on this skill and managing his walker in small spaces.   Rehab Potential Good   Current Impairments/barriers affecting progress: positive: family support, negative: level of motivation, progressive disease   OT Frequency 2x / week   OT Duration 12 weeks   OT Treatment/Interventions Self-care/ADL training;Moist Heat;DME and/or AE instruction;Patient/family education;Therapeutic exercises;Balance training;Therapeutic exercise;Therapeutic activities;Neuromuscular education;Functional Mobility Training;Manual Therapy   Consulted and Agree with Plan of Care Patient;Family member/caregiver   Family Member Consulted wife      Patient will benefit from skilled therapeutic intervention in order to improve the following deficits and impairments:  Abnormal gait, Decreased coordination, Decreased range of motion, Difficulty walking, Decreased  endurance, Decreased safety awareness, Decreased activity tolerance, Decreased balance, Impaired UE functional use, Pain, Decreased mobility, Decreased strength  Visit Diagnosis: Difficulty in walking, not elsewhere classified  Muscle weakness (generalized)  Unsteadiness on feet  Other lack of coordination  Abnormal posture    Problem List Patient Active Problem List   Diagnosis Date Noted  . BPH with urinary obstruction 11/14/2016  . Urinary frequency 10/10/2016  . Microscopic hematuria 10/10/2016  . Parkinson's syndrome ([place]) 06/14/2016  . Arthritis 06/14/2016  . Depression 06/14/2016  . Heartburn 06/14/2016  . Urinary incontinence 06/14/2016  . Skin lesion 06/14/2016  . Dementia 06/14/2016  . Bilateral edema of lower extremity 06/14/2016   Achilles Dunk, OTR/L, CLT  Lovett,Amy 07/21/2017, 5:15 PM  [place] MAIN [facility] SERVICES [address] [place], [place], [address] Phone: [phone]   Fax:  [phone]  Name: Clanton Emanuelson MRN: [id number] Date of Birth: 12/28/1932

## 2017-07-21 NOTE — Therapy (Signed)
[place] MAIN [facility] SERVICES [address] [place], [place], [address] Phone: [phone]   Fax:  [phone]  Occupational Therapy Treatment  Patient Details  Name: Luke Durham MRN: [id number] Date of Birth: [birth year] Referring Provider: Marlyn Corporal  Encounter Date: 07/13/2017      OT End of Session - 07/21/17 1717    Visit Number 56   Number of Visits 41   Date for OT Re-Evaluation 08/18/17   Authorization Type Medicare visit 78 g codes   OT Start Time 1100   OT Stop Time 1201   OT Time Calculation (min) 61 min   Activity Tolerance Patient tolerated treatment well   Behavior During Therapy [facility] for tasks assessed/performed      Past Medical History:  Diagnosis Date  . Anxiety   . Arthritis   . Asthma    childhood asthma  . Cancer ([place]) 7 years ago   lymphoma, [place] (recent)  . Chronic kidney disease   . Colon polyps   . GERD (gastroesophageal reflux disease)   . History of kidney stones   . Parkinson disease [facility])     Past Surgical History:  Procedure Laterality Date  . COLON SURGERY    . CYSTOSCOPY WITH LITHOLAPAXY N/A 11/14/2016   Procedure: CYSTOSCOPY WITH LITHOLAPAXY;  Surgeon: Hollice Espy, MD;  Location: [facility] ORS;  Service: Urology;  Laterality: N/A;  . EYE SURGERY Bilateral    Cataract Extraction with IOL  . HERNIA REPAIR  20 years  . MOHS SURGERY    . SMALL INTESTINE SURGERY     Per patient 7 years  . TRANSURETHRAL RESECTION OF PROSTATE N/A 11/14/2016   Procedure: TRANSURETHRAL RESECTION OF THE PROSTATE (TURP);  Surgeon: Hollice Espy, MD;  Location: [facility] ORS;  Service: Urology;  Laterality: N/A;    There were no vitals filed for this visit.      Subjective Assessment - 07/21/17 1716    Subjective  Patient reports his wife will have surgery on her hand for a trigger finger tomorrow. His daughter is planning to come and stay the weekend to assist with the dog and tasks around the house.   Patient is  accompained by: Family member   Pertinent History Patient reports he was diagnosed with Parkinson's disease about 10 plus years ago.  He reports a more recent decline in function in his daily activities in the last 6-12 months.  He has been seeing PT for the last couple months.   Patient Stated Goals Patient reports he wants to be able to do more for himself and around the house.    Currently in Pain? No/denies   Pain Score 0-No pain                      OT Treatments/Exercises (OP) - 07/21/17 1719      ADLs   ADL Comments Patient seen for reaching to shoes for shoe tying, managing stairs with big turn at the landing, turning in small spaces with cues for BIG turning behaviors, and Posture on the wall in standing with cues for shoulder retraction and head position.     Neurological Re-education Exercises   Other Exercises 1 Patient seen for instruction of LSVT BIG exercises: LSVT Daily Session Maximal Daily Exercises: Sustained movements are designed to rescale the amplitude of movement output for generalization to daily functional activities. Performed as follows for 1 set of 10 repetitions each: Multi directional sustained movements- 1) Floor to ceiling, 2) Side  to side. Multi directional Repetitive movements performed in standing and are designed to provide retraining effort needed for sustained muscle activation in tasks Performed as follows: 3) Step and reach forward, 4) Step and Reach Backwards, 5) Step and reach sideways, 6) Rock and reach forward/backward, 7) Rock and reach sideways. Exercises performed in standard version without the use of a chair with min assist from therapist and verbal cues.                 OT Education - 07/21/17 1716    Education provided Yes   Education Details standard version of exercises in the clinic, to continue with adapted version at home   Person(s) Educated Spouse;Patient   Methods Explanation;Demonstration;Verbal cues    Comprehension Verbal cues required;Returned demonstration;Verbalized understanding             OT Long Term Goals - 06/16/17 0857      OT LONG TERM GOAL #1   Title Patient will demonstrate increase right hand grip to be able to cut food with modified independence.    Baseline occasional assist required.   Time 12   Period Weeks   Status Partially Met     OT LONG TERM GOAL #2   Title Patient will complete donning long sleeve shirt with modified independence.   Baseline requires assist with long sleeves, can perform short sleeves.   Time 12   Period Weeks   Status Partially Met     OT LONG TERM GOAL #3   Title Patient will improve coordination to manage buttons on clothing with occasional assistance only.   Baseline requires assist with buttons each time   Time 12   Period Weeks   Status Partially Met     OT LONG TERM GOAL #4   Title Patient will complete managing pants after toileting with modified independence.    Baseline assist most days    Time 12   Period Weeks   Status Achieved     OT LONG TERM GOAL #5   Title Patient will improve hand strength bilaterally to be able to don socks with modified independence.    Baseline unable at evaluation   Time 4   Period Weeks   Status Achieved     OT LONG TERM GOAL #6   Title Patient will improve gait speed and endurance and be able to walk 1050 feet in 6 minutes to negotiate around the home and community safely in 4 weeks   Baseline 8546 at recert 11/30/01   Time 4   Period Weeks   Status Revised     OT LONG TERM GOAL #7   Title Patient will complete HEP for maximal daily exercises with modified independence in 4 weeks   Time 12   Period Weeks   Status Partially Met     OT LONG TERM GOAL #8   Title Patient will transfer from sit to stand without the use of arms safely and independently from a variety of chairs/surfaces in 4 weeks.    Baseline difficulty from lower surfaces   Time 4   Period Weeks   Status  Achieved     OT LONG TERM GOAL  #9   Baseline Patient will demonstrate decreased episodes of freezing of behaviors from score of 16 to score of less than 12.   Time 4   Period Weeks   Status Achieved               Plan - 07/21/17 1718  Clinical Impression Statement Continued focus on challenging balance in standing with performance of LSVT big exercises in the standard version rather than an adapted version with a chair. Therapist providing min guard to minimal assist during exercises in standing along with verbal cues. He did not display any major loss of balance this day during exercises. He will continue to perform at home two times a day in the adapted version for safety. We will continue to work towards proficiency with exercises in standing with good balance in a standard format. He reports he has not been as consistent with posture exercises at home therefore performed in the clinic today and recommend patient implement these daily to assist with improvements in posture and to help impact balance with readjustment in center of gravity. Patient continues to benefit from skilled occupational therapy to increase his independence in daily activities and reduce his risk of falls.   Rehab Potential Good   Current Impairments/barriers affecting progress: positive: family support, negative: level of motivation, progressive disease   OT Frequency 2x / week   OT Duration 12 weeks   OT Treatment/Interventions Self-care/ADL training;Moist Heat;DME and/or AE instruction;Patient/family education;Therapeutic exercises;Balance training;Therapeutic exercise;Therapeutic activities;Neuromuscular education;Functional Mobility Training;Manual Therapy   Consulted and Agree with Plan of Care Patient;Family member/caregiver   Family Member Consulted wife      Patient will benefit from skilled therapeutic intervention in order to improve the following deficits and impairments:  Abnormal gait, Decreased  coordination, Decreased range of motion, Difficulty walking, Decreased endurance, Decreased safety awareness, Decreased activity tolerance, Decreased balance, Impaired UE functional use, Pain, Decreased mobility, Decreased strength  Visit Diagnosis: Difficulty in walking, not elsewhere classified  Muscle weakness (generalized)  Unsteadiness on feet  Other lack of coordination  Abnormal posture    Problem List Patient Active Problem List   Diagnosis Date Noted  . BPH with urinary obstruction 11/14/2016  . Urinary frequency 10/10/2016  . Microscopic hematuria 10/10/2016  . Parkinson's syndrome ([place]) 06/14/2016  . Arthritis 06/14/2016  . Depression 06/14/2016  . Heartburn 06/14/2016  . Urinary incontinence 06/14/2016  . Skin lesion 06/14/2016  . Dementia 06/14/2016  . Bilateral edema of lower extremity 06/14/2016   Achilles Dunk, OTR/L, CLT  Lovett,Amy 07/21/2017, 5:20 PM  [place] MAIN [facility] SERVICES [address] [place], [place], [address] Phone: [phone]   Fax:  [phone]  Name: Murat Rideout MRN: [id number] Date of Birth: 1933-08-17

## 2017-07-22 ENCOUNTER — Encounter: Payer: Self-pay | Admitting: Occupational Therapy

## 2017-07-22 NOTE — Therapy (Signed)
[place] MAIN [facility] SERVICES [address] [place], [place], [address] Phone: [phone]   Fax:  [phone]  Occupational Therapy Treatment  Patient Details  Name: Luke Durham MRN: [id number] Date of Birth: 04-13-1933 Referring Provider: Marlyn Corporal  Encounter Date: 07/20/2017      OT End of Session - 07/22/17 1122    Visit Number 57   Number of Visits 75   Date for OT Re-Evaluation 08/18/17   Authorization Type Medicare visit 58 g codes   OT Start Time 1100   OT Stop Time 1200   OT Time Calculation (min) 60 min   Activity Tolerance Patient tolerated treatment well   Behavior During Therapy [facility] for tasks assessed/performed      Past Medical History:  Diagnosis Date  . Anxiety   . Arthritis   . Asthma    childhood asthma  . Cancer ([place]) 7 years ago   lymphoma, [place] (recent)  . Chronic kidney disease   . Colon polyps   . GERD (gastroesophageal reflux disease)   . History of kidney stones   . Parkinson disease [facility])     Past Surgical History:  Procedure Laterality Date  . COLON SURGERY    . CYSTOSCOPY WITH LITHOLAPAXY N/A 11/14/2016   Procedure: CYSTOSCOPY WITH LITHOLAPAXY;  Surgeon: Hollice Espy, MD;  Location: [facility] ORS;  Service: Urology;  Laterality: N/A;  . EYE SURGERY Bilateral    Cataract Extraction with IOL  . HERNIA REPAIR  20 years  . MOHS SURGERY    . SMALL INTESTINE SURGERY     Per patient 7 years  . TRANSURETHRAL RESECTION OF PROSTATE N/A 11/14/2016   Procedure: TRANSURETHRAL RESECTION OF THE PROSTATE (TURP);  Surgeon: Hollice Espy, MD;  Location: [facility] ORS;  Service: Urology;  Laterality: N/A;    There were no vitals filed for this visit.      Subjective Assessment - 07/22/17 1120    Subjective  Patient reports his wife's surgery on her hand went well, their daughter stayed with them over the weekend to help out and prepare meals.     Pertinent History Patient reports he was diagnosed with Parkinson's  disease about 10 plus years ago.  He reports a more recent decline in function in his daily activities in the last 6-12 months.  He has been seeing PT for the last couple months.   Patient Stated Goals Patient reports he wants to be able to do more for himself and around the house.    Currently in Pain? No/denies   Pain Score 0-No pain                      OT Treatments/Exercises (OP) - 07/22/17 1258      ADLs   ADL Comments Patient seen for reaching to shoes for shoe tying, managing stairs with big turn at the landing, turning in small spaces with cues for BIG turning behaviors, and Posture on the wall in standing with cues for shoulder retraction and head position.     Neurological Re-education Exercises   Other Exercises 1 Patient seen for instruction of LSVT BIG exercises: LSVT Daily Session Maximal Daily Exercises: Sustained movements are designed to rescale the amplitude of movement output for generalization to daily functional activities. Performed as follows for 1 set of 10 repetitions each: Multi directional sustained movements- 1) Floor to ceiling, 2) Side to side. Multi directional Repetitive movements performed in standing and are designed to provide retraining effort needed  for sustained muscle activation in tasks Performed as follows: 3) Step and reach forward, 4) Step and Reach Backwards, 5) Step and reach sideways, 6) Rock and reach forward/backward, 7) Rock and reach sideways. Exercises performed in standard version without the use of a chair with min assist from therapist and verbal cues.    Other Exercises 2 Focused on turning in small spaces with cues and SBA.                  OT Education - 07/22/17 1121    Education provided Yes   Education Details daily exercises, navigating in crowded spaces    Person(s) Educated Patient   Methods Explanation;Demonstration;Verbal cues   Comprehension Verbal cues required;Returned demonstration;Verbalized  understanding             OT Long Term Goals - 06/16/17 0857      OT LONG TERM GOAL #1   Title Patient will demonstrate increase right hand grip to be able to cut food with modified independence.    Baseline occasional assist required.   Time 12   Period Weeks   Status Partially Met     OT LONG TERM GOAL #2   Title Patient will complete donning long sleeve shirt with modified independence.   Baseline requires assist with long sleeves, can perform short sleeves.   Time 12   Period Weeks   Status Partially Met     OT LONG TERM GOAL #3   Title Patient will improve coordination to manage buttons on clothing with occasional assistance only.   Baseline requires assist with buttons each time   Time 12   Period Weeks   Status Partially Met     OT LONG TERM GOAL #4   Title Patient will complete managing pants after toileting with modified independence.    Baseline assist most days    Time 12   Period Weeks   Status Achieved     OT LONG TERM GOAL #5   Title Patient will improve hand strength bilaterally to be able to don socks with modified independence.    Baseline unable at evaluation   Time 4   Period Weeks   Status Achieved     OT LONG TERM GOAL #6   Title Patient will improve gait speed and endurance and be able to walk 1050 feet in 6 minutes to negotiate around the home and community safely in 4 weeks   Baseline 2993 at recert 04/23/68   Time 4   Period Weeks   Status Revised     OT LONG TERM GOAL #7   Title Patient will complete HEP for maximal daily exercises with modified independence in 4 weeks   Time 12   Period Weeks   Status Partially Met     OT LONG TERM GOAL #8   Title Patient will transfer from sit to stand without the use of arms safely and independently from a variety of chairs/surfaces in 4 weeks.    Baseline difficulty from lower surfaces   Time 4   Period Weeks   Status Achieved     OT LONG TERM GOAL  #9   Baseline Patient will demonstrate  decreased episodes of freezing of behaviors from score of 16 to score of less than 12.   Time 4   Period Weeks   Status Achieved               Plan - 07/22/17 1123    Clinical Impression Statement Patient's  blood pressure taken prior to analyze 124/77, during their 123/66, and at the end of therapy 119/74. Higher than in past sessions but still in the normal range since patients no has been running on the lower end. He is also monitoring at home. Wife reports physician adjusted his medication a few weeks ago. He continues to demonstrate improvements with standing balance in the clinic And performance of standard version of LSVT big exercises. No loss of balance this date with exercises performed with min guard from therapist.    Rehab Potential Good   Current Impairments/barriers affecting progress: positive: family support, negative: level of motivation, progressive disease   OT Frequency 2x / week   OT Duration 12 weeks   OT Treatment/Interventions Self-care/ADL training;Moist Heat;DME and/or AE instruction;Patient/family education;Therapeutic exercises;Balance training;Therapeutic exercise;Therapeutic activities;Neuromuscular education;Functional Mobility Training;Manual Therapy   Consulted and Agree with Plan of Care Patient;Family member/caregiver   Family Member Consulted wife      Patient will benefit from skilled therapeutic intervention in order to improve the following deficits and impairments:  Abnormal gait, Decreased coordination, Decreased range of motion, Difficulty walking, Decreased endurance, Decreased safety awareness, Decreased activity tolerance, Decreased balance, Impaired UE functional use, Pain, Decreased mobility, Decreased strength  Visit Diagnosis: Difficulty in walking, not elsewhere classified  Muscle weakness (generalized)  Unsteadiness on feet  Other lack of coordination  Abnormal posture    Problem List Patient Active Problem List   Diagnosis  Date Noted  . BPH with urinary obstruction 11/14/2016  . Urinary frequency 10/10/2016  . Microscopic hematuria 10/10/2016  . Parkinson's syndrome ([place]) 06/14/2016  . Arthritis 06/14/2016  . Depression 06/14/2016  . Heartburn 06/14/2016  . Urinary incontinence 06/14/2016  . Skin lesion 06/14/2016  . Dementia 06/14/2016  . Bilateral edema of lower extremity 06/14/2016   Achilles Dunk, OTR/L, CLT  Lovett,Amy 07/22/2017, 12:59 PM  [place] MAIN [facility] SERVICES [address] [place], [place], [address] Phone: [phone]   Fax:  [phone]  Name: Luke Durham MRN: [id number] Date of Birth: 1933-04-27

## 2017-07-25 ENCOUNTER — Ambulatory Visit: Payer: Medicare Other | Attending: Neurology | Admitting: Occupational Therapy

## 2017-07-25 DIAGNOSIS — R262 Difficulty in walking, not elsewhere classified: Secondary | ICD-10-CM | POA: Diagnosis present

## 2017-07-25 DIAGNOSIS — M6281 Muscle weakness (generalized): Secondary | ICD-10-CM | POA: Insufficient documentation

## 2017-07-25 DIAGNOSIS — R293 Abnormal posture: Secondary | ICD-10-CM | POA: Diagnosis present

## 2017-07-25 DIAGNOSIS — R278 Other lack of coordination: Secondary | ICD-10-CM | POA: Diagnosis present

## 2017-07-25 DIAGNOSIS — R2681 Unsteadiness on feet: Secondary | ICD-10-CM | POA: Diagnosis present

## 2017-07-28 ENCOUNTER — Ambulatory Visit: Payer: Medicare Other | Admitting: Occupational Therapy

## 2017-07-28 DIAGNOSIS — R278 Other lack of coordination: Secondary | ICD-10-CM

## 2017-07-28 DIAGNOSIS — R2681 Unsteadiness on feet: Secondary | ICD-10-CM

## 2017-07-28 DIAGNOSIS — R262 Difficulty in walking, not elsewhere classified: Secondary | ICD-10-CM | POA: Diagnosis not present

## 2017-07-28 DIAGNOSIS — R293 Abnormal posture: Secondary | ICD-10-CM

## 2017-07-28 DIAGNOSIS — M6281 Muscle weakness (generalized): Secondary | ICD-10-CM

## 2017-07-31 ENCOUNTER — Encounter: Payer: Self-pay | Admitting: Occupational Therapy

## 2017-08-01 ENCOUNTER — Ambulatory Visit: Payer: Medicare Other | Admitting: Occupational Therapy

## 2017-08-01 ENCOUNTER — Encounter: Payer: Self-pay | Admitting: Occupational Therapy

## 2017-08-01 DIAGNOSIS — R293 Abnormal posture: Secondary | ICD-10-CM

## 2017-08-01 DIAGNOSIS — R2681 Unsteadiness on feet: Secondary | ICD-10-CM

## 2017-08-01 DIAGNOSIS — M6281 Muscle weakness (generalized): Secondary | ICD-10-CM

## 2017-08-01 DIAGNOSIS — R278 Other lack of coordination: Secondary | ICD-10-CM

## 2017-08-01 DIAGNOSIS — R262 Difficulty in walking, not elsewhere classified: Secondary | ICD-10-CM | POA: Diagnosis not present

## 2017-08-01 NOTE — Therapy (Signed)
[place] MAIN [facility] SERVICES [address] [place], [place], [address] Phone: [phone]   Fax:  [phone]  Occupational Therapy Treatment  Patient Details  Name: Luke Durham MRN: [id number] Date of Birth: [birth year] Referring Provider: Marlyn Corporal  Encounter Date: 07/25/2017      OT End of Session - 07/31/17 1234    Visit Number 59   Number of Visits 10   Date for OT Re-Evaluation 08/18/17   Authorization Type Medicare visit 59 g codes   OT Start Time 1400   OT Stop Time 1500   OT Time Calculation (min) 60 min   Activity Tolerance Patient tolerated treatment well   Behavior During Therapy [facility] for tasks assessed/performed      Past Medical History:  Diagnosis Date  . Anxiety   . Arthritis   . Asthma    childhood asthma  . Cancer ([place]) 7 years ago   lymphoma, [place] (recent)  . Chronic kidney disease   . Colon polyps   . GERD (gastroesophageal reflux disease)   . History of kidney stones   . Parkinson disease [facility])     Past Surgical History:  Procedure Laterality Date  . COLON SURGERY    . CYSTOSCOPY WITH LITHOLAPAXY N/A 11/14/2016   Procedure: CYSTOSCOPY WITH LITHOLAPAXY;  Surgeon: Hollice Espy, MD;  Location: [facility] ORS;  Service: Urology;  Laterality: N/A;  . EYE SURGERY Bilateral    Cataract Extraction with IOL  . HERNIA REPAIR  20 years  . MOHS SURGERY    . SMALL INTESTINE SURGERY     Per patient 7 years  . TRANSURETHRAL RESECTION OF PROSTATE N/A 11/14/2016   Procedure: TRANSURETHRAL RESECTION OF THE PROSTATE (TURP);  Surgeon: Hollice Espy, MD;  Location: [facility] ORS;  Service: Urology;  Laterality: N/A;    There were no vitals filed for this visit.      Subjective Assessment - 07/31/17 1232    Subjective  Patient reports his blood pressure has remained in the higher range for him but still within normal limits.  Patient denies any recent falls.    Pertinent History Patient reports he was diagnosed with  Parkinson's disease about 10 plus years ago.  He reports a more recent decline in function in his daily activities in the last 6-12 months.  He has been seeing PT for the last couple months.   Patient Stated Goals Patient reports he wants to be able to do more for himself and around the house.    Currently in Pain? No/denies   Pain Score 0-No pain                      OT Treatments/Exercises (OP) - 08/01/17 1609      Neurological Re-education Exercises   Other Exercises 1 Patient seen for instruction of LSVT BIG exercises: LSVT Daily Session Maximal Daily Exercises: Sustained movements are designed to rescale the amplitude of movement output for generalization to daily functional activities. Performed as follows for 1 set of 10 repetitions each: Multi directional sustained movements- 1) Floor to ceiling, 2) Side to side. Multi directional Repetitive movements performed in standing and are designed to provide retraining effort needed for sustained muscle activation in tasks Performed as follows: 3) Step and reach forward, 4) Step and Reach Backwards, 5) Step and reach sideways, 6) Rock and reach forward/backward, 7) Rock and reach sideways. Exercises performed in standard version without the use of a chair with min assist to min guard from  therapist and verbal cues.    Other Exercises 2 Emphasis on turning in small spaces this date, nagivating in a crowded area and diminishing freezing of gait behaviors with time in stimulus and repetition to decrease effect.                 OT Education - 07/31/17 1233    Education provided Yes   Education Details use of rollator in small spaces, crowded places   Person(s) Educated Patient   Methods Explanation;Demonstration;Verbal cues   Comprehension Verbal cues required;Returned demonstration;Verbalized understanding             OT Long Term Goals - 06/16/17 0857      OT LONG TERM GOAL #1   Title Patient will demonstrate  increase right hand grip to be able to cut food with modified independence.    Baseline occasional assist required.   Time 12   Period Weeks   Status Partially Met     OT LONG TERM GOAL #2   Title Patient will complete donning long sleeve shirt with modified independence.   Baseline requires assist with long sleeves, can perform short sleeves.   Time 12   Period Weeks   Status Partially Met     OT LONG TERM GOAL #3   Title Patient will improve coordination to manage buttons on clothing with occasional assistance only.   Baseline requires assist with buttons each time   Time 12   Period Weeks   Status Partially Met     OT LONG TERM GOAL #4   Title Patient will complete managing pants after toileting with modified independence.    Baseline assist most days    Time 12   Period Weeks   Status Achieved     OT LONG TERM GOAL #5   Title Patient will improve hand strength bilaterally to be able to don socks with modified independence.    Baseline unable at evaluation   Time 4   Period Weeks   Status Achieved     OT LONG TERM GOAL #6   Title Patient will improve gait speed and endurance and be able to walk 1050 feet in 6 minutes to negotiate around the home and community safely in 4 weeks   Baseline 5726 at recert 2/0/35   Time 4   Period Weeks   Status Revised     OT LONG TERM GOAL #7   Title Patient will complete HEP for maximal daily exercises with modified independence in 4 weeks   Time 12   Period Weeks   Status Partially Met     OT LONG TERM GOAL #8   Title Patient will transfer from sit to stand without the use of arms safely and independently from a variety of chairs/surfaces in 4 weeks.    Baseline difficulty from lower surfaces   Time 4   Period Weeks   Status Achieved     OT LONG TERM GOAL  #9   Baseline Patient will demonstrate decreased episodes of freezing of behaviors from score of 16 to score of less than 12.   Time 4   Period Weeks   Status Achieved                Plan - 07/31/17 1235    Clinical Impression Statement Patient demonstrated slightly increased freezing this date, lasting from 1 to three seconds at times. Freezing tended to happen more when moving in a small space and/or moving in a crowded area.  Repetitive trials of turning in a small space with cues and min guard to help diminish freezing behaviors. Patient reports an increase in freezing behaviors at home and more so at night. He has not shown evidence of this in the last few treatment sessions until today. Continue to work towards advancing maximal daily exercises in a standard version, balance and safety with tasks.    Rehab Potential Good   Current Impairments/barriers affecting progress: positive: family support, negative: level of motivation, progressive disease   OT Frequency 2x / week   OT Duration 12 weeks   OT Treatment/Interventions Self-care/ADL training;Moist Heat;DME and/or AE instruction;Patient/family education;Therapeutic exercises;Balance training;Therapeutic exercise;Therapeutic activities;Neuromuscular education;Functional Mobility Training;Manual Therapy   Consulted and Agree with Plan of Care Patient   Family Member Consulted wife      Patient will benefit from skilled therapeutic intervention in order to improve the following deficits and impairments:  Abnormal gait, Decreased coordination, Decreased range of motion, Difficulty walking, Decreased endurance, Decreased safety awareness, Decreased activity tolerance, Decreased balance, Impaired UE functional use, Pain, Decreased mobility, Decreased strength  Visit Diagnosis: Difficulty in walking, not elsewhere classified  Muscle weakness (generalized)  Unsteadiness on feet  Other lack of coordination  Abnormal posture    Problem List Patient Active Problem List   Diagnosis Date Noted  . BPH with urinary obstruction 11/14/2016  . Urinary frequency 10/10/2016  . Microscopic hematuria  10/10/2016  . Parkinson's syndrome ([employer]) 06/14/2016  . Arthritis 06/14/2016  . Depression 06/14/2016  . Heartburn 06/14/2016  . Urinary incontinence 06/14/2016  . Skin lesion 06/14/2016  . Dementia 06/14/2016  . Bilateral edema of lower extremity 06/14/2016   Achilles Dunk, OTR/L, CLT  Lovett,Amy 08/01/2017, 4:11 PM  [place] MAIN [facility] SERVICES [address] [place], [place], [address] Phone: [phone]   Fax:  [phone]  Name: Leor Whyte MRN: [id number] Date of Birth: [birth year]

## 2017-08-02 ENCOUNTER — Encounter: Payer: Self-pay | Admitting: Occupational Therapy

## 2017-08-02 NOTE — Therapy (Signed)
[place] MAIN [facility] SERVICES [address] [place], [place], [address] Phone: [phone]   Fax:  [phone]  Occupational Therapy Treatment  Patient Details  Name: Luke Durham MRN: [id number] Date of Birth: [birth year] Referring Provider: Marlyn Corporal  Encounter Date: 07/28/2017      OT End of Session - 08/01/17 2014    Visit Number 60   Number of Visits 71   Date for OT Re-Evaluation 08/18/17   Authorization Type Medicare visit 60 g codes   OT Start Time 1102   OT Stop Time 1200   OT Time Calculation (min) 58 min   Activity Tolerance Patient tolerated treatment well   Behavior During Therapy [facility] for tasks assessed/performed      Past Medical History:  Diagnosis Date  . Anxiety   . Arthritis   . Asthma    childhood asthma  . Cancer ([place]) 7 years ago   lymphoma, [place] (recent)  . Chronic kidney disease   . Colon polyps   . GERD (gastroesophageal reflux disease)   . History of kidney stones   . Parkinson disease [facility])     Past Surgical History:  Procedure Laterality Date  . COLON SURGERY    . CYSTOSCOPY WITH LITHOLAPAXY N/A 11/14/2016   Procedure: CYSTOSCOPY WITH LITHOLAPAXY;  Surgeon: Hollice Espy, MD;  Location: [facility] ORS;  Service: Urology;  Laterality: N/A;  . EYE SURGERY Bilateral    Cataract Extraction with IOL  . HERNIA REPAIR  20 years  . MOHS SURGERY    . SMALL INTESTINE SURGERY     Per patient 7 years  . TRANSURETHRAL RESECTION OF PROSTATE N/A 11/14/2016   Procedure: TRANSURETHRAL RESECTION OF THE PROSTATE (TURP);  Surgeon: Hollice Espy, MD;  Location: [facility] ORS;  Service: Urology;  Laterality: N/A;    There were no vitals filed for this visit.      Subjective Assessment - 08/01/17 2013    Subjective  Patient reports he is hopeful to go to the [place] with friends and family tomorrow followed by a trip to the store at [employer] in [place]   Pertinent History Patient reports he was diagnosed with  Parkinson's disease about 10 plus years ago.  He reports a more recent decline in function in his daily activities in the last 6-12 months.  He has been seeing PT for the last couple months.   Patient Stated Goals Patient reports he wants to be able to do more for himself and around the house.    Currently in Pain? Yes   Pain Score 2    Pain Location Arm   Pain Orientation Right   Pain Descriptors / Indicators Aching   Pain Type Chronic pain   Pain Onset More than a month ago   Pain Frequency Intermittent   Multiple Pain Sites No                      OT Treatments/Exercises (OP) - 08/02/17 1549      ADLs   ADL Comments Patient seen for crossed leg method for reaching to tie shoes with cues for positioning and arm support. Turning in small spaces to simulate bathroom transfers/manuevers, cues and SBA. Posture next to the wall with cues for scapular retraction, SBA.       Neurological Re-education Exercises   Other Exercises 1 Patient seen for instruction of LSVT BIG exercises: LSVT Daily Session Maximal Daily Exercises: Sustained movements are designed to rescale the amplitude  of movement output for generalization to daily functional activities. Performed as follows for 1 set of 10 repetitions each: Multi directional sustained movements- 1) Floor to ceiling, 2) Side to side. Multi directional Repetitive movements performed in standing and are designed to provide retraining effort needed for sustained muscle activation in tasks Performed as follows: 3) Step and reach forward, 4) Step and Reach Backwards, 5) Step and reach sideways, 6) Rock and reach forward/backward, 7) Rock and reach sideways. Exercises performed in standard version without the use of a chair with min assist to min guard from therapist and verbal cues.    Other Exercises 2 Functional mobility for 2 trials of 250 feet with walking outdoors on uneven surfaces, small pathways and winding/sloped areas.                  OT Education - 08/01/17 2014    Education provided Yes   Education Details progressing with exercises to standard version in the clinic to challenge balance more   Methods Explanation;Demonstration;Verbal cues   Comprehension Verbal cues required;Returned demonstration;Verbalized understanding             OT Long Term Goals - 08/01/17 2015      OT LONG TERM GOAL #1   Title Patient will demonstrate increase right hand grip to be able to cut food with modified independence.    Baseline occasional assist required.   Time 12   Period Weeks   Status Partially Met     OT LONG TERM GOAL #2   Title Patient will complete donning long sleeve shirt with modified independence.   Baseline requires assist with long sleeves, can perform short sleeves.   Time 12   Period Weeks   Status Partially Met     OT LONG TERM GOAL #3   Title Patient will improve coordination to manage buttons on clothing with occasional assistance only.   Baseline requires assist with buttons each time   Time 12   Period Weeks   Status Partially Met     OT LONG TERM GOAL #4   Title Patient will complete managing pants after toileting with modified independence.    Baseline assist most days    Time 12   Period Weeks   Status Achieved     OT LONG TERM GOAL #5   Title Patient will improve hand strength bilaterally to be able to don socks with modified independence.    Baseline unable at evaluation   Time 4   Period Weeks   Status Achieved     OT LONG TERM GOAL #6   Title Patient will improve gait speed and endurance and be able to walk 1050 feet in 6 minutes to negotiate around the home and community safely in 4 weeks   Baseline [id number] at recert 04/25/40   Time 4   Period Weeks   Status Revised     OT LONG TERM GOAL #7   Title Patient will complete HEP for maximal daily exercises with modified independence in 4 weeks   Time 12   Period Weeks   Status Partially Met     OT LONG  TERM GOAL #8   Title Patient will transfer from sit to stand without the use of arms safely and independently from a variety of chairs/surfaces in 4 weeks.    Baseline difficulty from lower surfaces   Time 4   Period Weeks   Status Achieved     OT LONG TERM GOAL  #9  Baseline Patient will demonstrate decreased episodes of freezing of behaviors from score of 16 to score of less than 12.   Time 4   Period Weeks   Status Achieved               Plan - 08/01/17 2015    Clinical Impression Statement Decreased freezing of gait this date compared to last session.  Continued to work in small spaces with turning using new rollator with cues for positioning of self for safety and effective use.  Patient able to demonstrate posture exercises with cues at the wall, benefits from verbal cues but also tactile cues at the shoulders for retraction and head position.  Patient continues to progress to performing exercises in a standard version versus adapted version with improvements noted with balance and no recent falls.  He continues to benefit from skilled OT to maximize his safety and independence in daily tasks.    Rehab Potential Good   Current Impairments/barriers affecting progress: positive: family support, negative: level of motivation, progressive disease   OT Frequency 2x / week   OT Duration 12 weeks   OT Treatment/Interventions Self-care/ADL training;Moist Heat;DME and/or AE instruction;Patient/family education;Therapeutic exercises;Balance training;Therapeutic exercise;Therapeutic activities;Neuromuscular education;Functional Mobility Training;Manual Therapy   Consulted and Agree with Plan of Care Patient      Patient will benefit from skilled therapeutic intervention in order to improve the following deficits and impairments:  Abnormal gait, Decreased coordination, Decreased range of motion, Difficulty walking, Decreased endurance, Decreased safety awareness, Decreased activity  tolerance, Decreased balance, Impaired UE functional use, Pain, Decreased mobility, Decreased strength  Visit Diagnosis: Difficulty in walking, not elsewhere classified  Muscle weakness (generalized)  Unsteadiness on feet  Other lack of coordination  Abnormal posture    Problem List Patient Active Problem List   Diagnosis Date Noted  . BPH with urinary obstruction 11/14/2016  . Urinary frequency 10/10/2016  . Microscopic hematuria 10/10/2016  . Parkinson's syndrome ([place]) 06/14/2016  . Arthritis 06/14/2016  . Depression 06/14/2016  . Heartburn 06/14/2016  . Urinary incontinence 06/14/2016  . Skin lesion 06/14/2016  . Dementia 06/14/2016  . Bilateral edema of lower extremity 06/14/2016   Achilles Dunk, OTR/L, CLT  Lovett,Amy 08/02/2017, 3:56 PM  [place] MAIN [facility] SERVICES [address] [place], [place], [address] Phone: [phone]   Fax:  [phone]  Name: Jovann Luse MRN: [id number] Date of Birth: [birth year]

## 2017-08-04 ENCOUNTER — Ambulatory Visit: Payer: Medicare Other | Admitting: Occupational Therapy

## 2017-08-04 DIAGNOSIS — R262 Difficulty in walking, not elsewhere classified: Secondary | ICD-10-CM | POA: Diagnosis not present

## 2017-08-04 DIAGNOSIS — R293 Abnormal posture: Secondary | ICD-10-CM

## 2017-08-04 DIAGNOSIS — M6281 Muscle weakness (generalized): Secondary | ICD-10-CM

## 2017-08-04 DIAGNOSIS — R2681 Unsteadiness on feet: Secondary | ICD-10-CM

## 2017-08-04 DIAGNOSIS — R278 Other lack of coordination: Secondary | ICD-10-CM

## 2017-08-05 ENCOUNTER — Encounter: Payer: Self-pay | Admitting: Occupational Therapy
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
## 2017-08-05 NOTE — Therapy (Signed)
[place] MAIN [facility] SERVICES [address] [place], [place], [address] Phone: [phone]   Fax:  [phone]  Occupational Therapy Treatment  Patient Details  Name: Luke Durham MRN: [id number] Date of Birth: [birth year] Referring Provider: Marlyn Corporal  Encounter Date: 08/01/2017      OT End of Session - 08/05/17 1105    Visit Number 61   Number of Visits 76   Date for OT Re-Evaluation 08/18/17   Authorization Type Medicare visit 61 g codes   OT Start Time 1000   OT Stop Time 1059   OT Time Calculation (min) 59 min   Activity Tolerance Patient tolerated treatment well   Behavior During Therapy [facility] for tasks assessed/performed      Past Medical History:  Diagnosis Date  . Anxiety   . Arthritis   . Asthma    childhood asthma  . Cancer ([place]) 7 years ago   lymphoma, [place] (recent)  . Chronic kidney disease   . Colon polyps   . GERD (gastroesophageal reflux disease)   . History of kidney stones   . Parkinson disease [facility])     Past Surgical History:  Procedure Laterality Date  . COLON SURGERY    . CYSTOSCOPY WITH LITHOLAPAXY N/A 11/14/2016   Procedure: CYSTOSCOPY WITH LITHOLAPAXY;  Surgeon: Hollice Espy, MD;  Location: [facility] ORS;  Service: Urology;  Laterality: N/A;  . EYE SURGERY Bilateral    Cataract Extraction with IOL  . HERNIA REPAIR  20 years  . MOHS SURGERY    . SMALL INTESTINE SURGERY     Per patient 7 years  . TRANSURETHRAL RESECTION OF PROSTATE N/A 11/14/2016   Procedure: TRANSURETHRAL RESECTION OF THE PROSTATE (TURP);  Surgeon: Hollice Espy, MD;  Location: [facility] ORS;  Service: Urology;  Laterality: N/A;    There were no vitals filed for this visit.      Subjective Assessment - 08/05/17 1104    Subjective  Patient reports he did go to the [place] this past weekend with his family and friends and had a great time and meal.  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  They went to [employer]  afterwards and he was able to walk around the whole store and help with shopping and picking out items he wanted to try.  States, "I told the guy we don't need to come back for a year with all the stuff we bought!"   Pertinent History Patient reports he was diagnosed with Parkinson's disease about 10 plus years ago.  He reports a more recent decline in function in his daily activities in the last 6-12 months.  He has been seeing PT for the last couple months.   Patient Stated Goals Patient reports he wants to be able to do more for himself and around the house.    Currently in Pain? No/denies   Pain Score 0-No pain                      OT Treatments/Exercises (OP) - 08/05/17 1109      ADLs   ADL Comments Patient seen for crossed leg method for reaching to tie shoes with cues for positioning and arm support. Turning in small spaces to simulate bathroom transfers/manuevers, cues and SBA. Posture next to the wall with cues for scapular retraction, SBA.      Neurological Re-education Exercises   Other Exercises 1 Patient seen for instruction of LSVT BIG exercises:  LSVT Daily Session Maximal Daily Exercises: Sustained movements are designed to rescale the amplitude of movement output for generalization to daily functional activities. Performed as follows for 1 set of 10 repetitions each: Multi directional sustained movements- 1) Floor to ceiling, 2) Side to side. Multi directional Repetitive movements performed in standing and are designed to provide retraining effort needed for sustained muscle activation in tasks Performed as follows: 3) Step and reach forward, 4) Step and Reach Backwards, 5) Step and reach sideways, 6) Rock and reach forward/backward, 7) Rock and reach sideways. Exercises performed in standard version without the use of a chair with min assist to min guard from therapist and verbal cues.    Other Exercises 2 Focus on balance and turning with rollator when manuevering in  hallways with people coming in opposite and same directions.  Cues to continue with normal gait speed and patterns and not to slow or hesitate which triggers increased freezing of gait.                 OT Education - 08/05/17 1105    Education provided Yes   Education Details standard maximal daily exercises, balance, turning             OT Long Term Goals - 08/01/17 2015      OT LONG TERM GOAL #1   Title Patient will demonstrate increase right hand grip to be able to cut food with modified independence.    Baseline occasional assist required.   Time 12   Period Weeks   Status Partially Met     OT LONG TERM GOAL #2   Title Patient will complete donning long sleeve shirt with modified independence.   Baseline requires assist with long sleeves, can perform short sleeves.   Time 12   Period Weeks   Status Partially Met     OT LONG TERM GOAL #3   Title Patient will improve coordination to manage buttons on clothing with occasional assistance only.   Baseline requires assist with buttons each time   Time 12   Period Weeks   Status Partially Met     OT LONG TERM GOAL #4   Title Patient will complete managing pants after toileting with modified independence.    Baseline assist most days    Time 12   Period Weeks   Status Achieved     OT LONG TERM GOAL #5   Title Patient will improve hand strength bilaterally to be able to don socks with modified independence.    Baseline unable at evaluation   Time 4   Period Weeks   Status Achieved     OT LONG TERM GOAL #6   Title Patient will improve gait speed and endurance and be able to walk 1050 feet in 6 minutes to negotiate around the home and community safely in 4 weeks   Baseline 4034 at recert 04/26/24   Time 4   Period Weeks   Status Revised     OT LONG TERM GOAL #7   Title Patient will complete HEP for maximal daily exercises with modified independence in 4 weeks   Time 12   Period Weeks   Status Partially  Met     OT LONG TERM GOAL #8   Title Patient will transfer from sit to stand without the use of arms safely and independently from a variety of chairs/surfaces in 4 weeks.    Baseline difficulty from lower surfaces   Time 4   Period  Weeks   Status Achieved     OT LONG TERM GOAL  #9   Baseline Patient will demonstrate decreased episodes of freezing of behaviors from score of 16 to score of less than 12.   Time 4   Period Weeks   Status Achieved             Patient will benefit from skilled therapeutic intervention in order to improve the following deficits and impairments:  Abnormal gait, Decreased coordination, Decreased range of motion, Difficulty walking, Decreased endurance, Decreased safety awareness, Decreased activity tolerance, Decreased balance, Impaired UE functional use, Pain, Decreased mobility, Decreased strength  Visit Diagnosis: Difficulty in walking, not elsewhere classified  Muscle weakness (generalized)  Other lack of coordination  Unsteadiness on feet  Abnormal posture    Problem List Patient Active Problem List   Diagnosis Date Noted  . BPH with urinary obstruction 11/14/2016  . Urinary frequency 10/10/2016  . Microscopic hematuria 10/10/2016  . Parkinson's syndrome ([place]) 06/14/2016  . Arthritis 06/14/2016  . Depression 06/14/2016  . Heartburn 06/14/2016  . Urinary incontinence 06/14/2016  . Skin lesion 06/14/2016  . Dementia 06/14/2016  . Bilateral edema of lower extremity 06/14/2016   Achilles Dunk, OTR/L, CLT  Lovett,Amy 08/05/2017, 11:12 AM  [place] MAIN [facility] SERVICES [address] [place], [place], [address] Phone: [phone]   Fax:  [phone]  Name: Vin Yonke MRN: [id number] Date of Birth: 30-Mar-1933

## 2017-08-07 ENCOUNTER — Ambulatory Visit: Payer: Medicare Other | Admitting: Occupational Therapy

## 2017-08-07 DIAGNOSIS — R278 Other lack of coordination: Secondary | ICD-10-CM

## 2017-08-07 DIAGNOSIS — R293 Abnormal posture: Secondary | ICD-10-CM

## 2017-08-07 DIAGNOSIS — R2681 Unsteadiness on feet: Secondary | ICD-10-CM

## 2017-08-07 DIAGNOSIS — R262 Difficulty in walking, not elsewhere classified: Secondary | ICD-10-CM | POA: Diagnosis not present

## 2017-08-07 DIAGNOSIS — M6281 Muscle weakness (generalized): Secondary | ICD-10-CM

## 2017-08-08 ENCOUNTER — Encounter: Payer: Self-pay | Admitting: Occupational Therapy

## 2017-08-10 NOTE — Therapy (Signed)
[place] MAIN [facility] SERVICES [address] [place], [place], [address] Phone: [phone]   Fax:  [phone]  Occupational Therapy Treatment  Patient Details  Name: Luke Durham MRN: [id number] Date of Birth: 1933/03/07 Referring Provider: Marlyn Corporal  Encounter Date: 08/07/2017      OT End of Session - 08/10/17 0086    Visit Number Jan 22, 2062   Number of Visits 5   Date for OT Re-Evaluation 08/18/17   Authorization Type Medicare visit 63 g codes   OT Start Time January 22, 1521   OT Stop Time 1625   OT Time Calculation (min) 63 min   Activity Tolerance Patient tolerated treatment well   Behavior During Therapy [facility] for tasks assessed/performed      Past Medical History:  Diagnosis Date  . Anxiety   . Arthritis   . Asthma    childhood asthma  . Cancer ([place]) 7 years ago   lymphoma, [place] (recent)  . Chronic kidney disease   . Colon polyps   . GERD (gastroesophageal reflux disease)   . History of kidney stones   . Parkinson disease [facility])     Past Surgical History:  Procedure Laterality Date  . COLON SURGERY    . CYSTOSCOPY WITH LITHOLAPAXY N/A 11/14/2016   Procedure: CYSTOSCOPY WITH LITHOLAPAXY;  Surgeon: Hollice Espy, MD;  Location: [facility] ORS;  Service: Urology;  Laterality: N/A;  . EYE SURGERY Bilateral    Cataract Extraction with IOL  . HERNIA REPAIR  20 years  . MOHS SURGERY    . SMALL INTESTINE SURGERY     Per patient 7 years  . TRANSURETHRAL RESECTION OF PROSTATE N/A 11/14/2016   Procedure: TRANSURETHRAL RESECTION OF THE PROSTATE (TURP);  Surgeon: Hollice Espy, MD;  Location: [facility] ORS;  Service: Urology;  Laterality: N/A;    There were no vitals filed for this visit.      Subjective Assessment - 08/10/17 1506    Subjective  Patient reports they did not lose any power during the [facility], had a quiet weekend, his daughter had to work so they did not do their usually 01-23-23 lunch date.    Pertinent History Patient reports he was  diagnosed with Parkinson's disease about 10 plus years ago.  He reports a more recent decline in function in his daily activities in the last 6-12 months.  He has been seeing PT for the last couple months.   Patient Stated Goals Patient reports he wants to be able to do more for himself and around the house.    Currently in Pain? Yes   Pain Score 3    Pain Location Arm   Pain Orientation Right   Pain Descriptors / Indicators Aching   Pain Type Chronic pain   Pain Onset More than a month ago   Pain Frequency Intermittent   Multiple Pain Sites No                      OT Treatments/Exercises (OP) - 08/10/17 0001      ADLs   ADL Comments Crossed leg method for shoe tying for 5 reps each with cues.  Sit to stand from mat table, regular chair and bench with cues for body positioning and weight shift forwards.       Neurological Re-education Exercises   Other Exercises 1 Patient seen for instruction of LSVT BIG exercises: LSVT Daily Session Maximal Daily Exercises: Sustained movements are designed to rescale the amplitude of movement output for generalization to  daily functional activities. Performed as follows for 1 set of 10 repetitions each: Multi directional sustained movements- 1) Floor to ceiling, 2) Side to side. Multi directional Repetitive movements performed in standing and are designed to provide retraining effort needed for sustained muscle activation in tasks Performed as follows: 3) Step and reach forward, 4) Step and Reach Backwards, 5) Step and reach sideways, 6) Rock and reach forward/backward, 7) Rock and reach sideways. Exercises performed in standard version without the use of a chair with min assist to min guard from therapist and verbal cues.   Other Exercises 2 Patient seen for functional mobility with rollator for 400 feet with cues for step length and turning.                  OT Education - 08/10/17 1516    Education provided Yes   Education Details  maximal daily exercises, turning, freezing of gait   Person(s) Educated Patient   Methods Explanation;Demonstration;Verbal cues   Comprehension Verbal cues required;Returned demonstration;Verbalized understanding             OT Long Term Goals - 08/01/17 2015      OT LONG TERM GOAL #1   Title Patient will demonstrate increase right hand grip to be able to cut food with modified independence.    Baseline occasional assist required.   Time 12   Period Weeks   Status Partially Met     OT LONG TERM GOAL #2   Title Patient will complete donning long sleeve shirt with modified independence.   Baseline requires assist with long sleeves, can perform short sleeves.   Time 12   Period Weeks   Status Partially Met     OT LONG TERM GOAL #3   Title Patient will improve coordination to manage buttons on clothing with occasional assistance only.   Baseline requires assist with buttons each time   Time 12   Period Weeks   Status Partially Met     OT LONG TERM GOAL #4   Title Patient will complete managing pants after toileting with modified independence.    Baseline assist most days    Time 12   Period Weeks   Status Achieved     OT LONG TERM GOAL #5   Title Patient will improve hand strength bilaterally to be able to don socks with modified independence.    Baseline unable at evaluation   Time 4   Period Weeks   Status Achieved     OT LONG TERM GOAL #6   Title Patient will improve gait speed and endurance and be able to walk 1050 feet in 6 minutes to negotiate around the home and community safely in 4 weeks   Baseline 8889 at recert 10/29/92   Time 4   Period Weeks   Status Revised     OT LONG TERM GOAL #7   Title Patient will complete HEP for maximal daily exercises with modified independence in 4 weeks   Time 12   Period Weeks   Status Partially Met     OT LONG TERM GOAL #8   Title Patient will transfer from sit to stand without the use of arms safely and independently  from a variety of chairs/surfaces in 4 weeks.    Baseline difficulty from lower surfaces   Time 4   Period Weeks   Status Achieved     OT LONG TERM GOAL  #9   Baseline Patient will demonstrate decreased episodes of freezing  of behaviors from score of 16 to score of less than 12.   Time 4   Period Weeks   Status Achieved               Plan - 08/10/17 1516    Clinical Impression Statement Patient reports decreased stiffness this date compared to last session.  He has been working on being consistent with exercises at home, still performing in an adapted version at home for safety but working towards standard version with therapist assist.  No loss of balance this date with exercises or tasks in the clinic.  No freezing of gait detected.     Rehab Potential Good   Current Impairments/barriers affecting progress: positive: family support, negative: level of motivation, progressive disease   OT Frequency 2x / week   OT Duration 12 weeks   OT Treatment/Interventions Self-care/ADL training;Moist Heat;DME and/or AE instruction;Patient/family education;Therapeutic exercises;Balance training;Therapeutic exercise;Therapeutic activities;Neuromuscular education;Functional Mobility Training;Manual Therapy   Consulted and Agree with Plan of Care Patient      Patient will benefit from skilled therapeutic intervention in order to improve the following deficits and impairments:  Abnormal gait, Decreased coordination, Decreased range of motion, Difficulty walking, Decreased endurance, Decreased safety awareness, Decreased activity tolerance, Decreased balance, Impaired UE functional use, Pain, Decreased mobility, Decreased strength  Visit Diagnosis: Difficulty in walking, not elsewhere classified  Muscle weakness (generalized)  Unsteadiness on feet  Other lack of coordination  Abnormal posture    Problem List Patient Active Problem List   Diagnosis Date Noted  . BPH with urinary  obstruction 11/14/2016  . Urinary frequency 10/10/2016  . Microscopic hematuria 10/10/2016  . Parkinson's syndrome ([place]) 06/14/2016  . Arthritis 06/14/2016  . Depression 06/14/2016  . Heartburn 06/14/2016  . Urinary incontinence 06/14/2016  . Skin lesion 06/14/2016  . Dementia 06/14/2016  . Bilateral edema of lower extremity 06/14/2016   Achilles Dunk, OTR/L, CLT  Lovett,Amy 08/10/2017, 3:20 PM  [place] MAIN [facility] SERVICES [address] [place], [place], [address] Phone: [phone]   Fax:  [phone]  Name: Luke Durham MRN: [id number] Date of Birth: 07-25-1933

## 2017-08-11 ENCOUNTER — Encounter: Payer: Self-pay | Admitting: Occupational Therapy

## 2017-08-11 ENCOUNTER — Ambulatory Visit: Payer: Medicare Other | Admitting: Occupational Therapy

## 2017-08-11 DIAGNOSIS — R2681 Unsteadiness on feet: Secondary | ICD-10-CM

## 2017-08-11 DIAGNOSIS — R293 Abnormal posture: Secondary | ICD-10-CM

## 2017-08-11 DIAGNOSIS — R262 Difficulty in walking, not elsewhere classified: Secondary | ICD-10-CM | POA: Diagnosis not present

## 2017-08-11 DIAGNOSIS — R278 Other lack of coordination: Secondary | ICD-10-CM

## 2017-08-11 DIAGNOSIS — M6281 Muscle weakness (generalized): Secondary | ICD-10-CM

## 2017-08-12 NOTE — Therapy (Signed)
[place] MAIN [facility] SERVICES [address] [place], [place], [address] Phone: [phone]   Fax:  [phone]  Occupational Therapy Treatment  Patient Details  Name: Luke Durham MRN: [id number] Date of Birth: 03-14-1933 Referring Provider: Marlyn Corporal  Encounter Date: 08/11/2017      OT End of Session - 08/11/17 2142    Visit Number 64   Number of Visits 64   Date for OT Re-Evaluation 08/18/17   Authorization Type Medicare visit 64 g codes   OT Start Time 1000   OT Stop Time 1100   OT Time Calculation (min) 60 min   Activity Tolerance Patient tolerated treatment well   Behavior During Therapy [facility] for tasks assessed/performed      Past Medical History:  Diagnosis Date  . Anxiety   . Arthritis   . Asthma    childhood asthma  . Cancer ([place]) 7 years ago   lymphoma, [place] (recent)  . Chronic kidney disease   . Colon polyps   . GERD (gastroesophageal reflux disease)   . History of kidney stones   . Parkinson disease [facility])     Past Surgical History:  Procedure Laterality Date  . COLON SURGERY    . CYSTOSCOPY WITH LITHOLAPAXY N/A 11/14/2016   Procedure: CYSTOSCOPY WITH LITHOLAPAXY;  Surgeon: Hollice Espy, MD;  Location: [facility] ORS;  Service: Urology;  Laterality: N/A;  . EYE SURGERY Bilateral    Cataract Extraction with IOL  . HERNIA REPAIR  20 years  . MOHS SURGERY    . SMALL INTESTINE SURGERY     Per patient 7 years  . TRANSURETHRAL RESECTION OF PROSTATE N/A 11/14/2016   Procedure: TRANSURETHRAL RESECTION OF THE PROSTATE (TURP);  Surgeon: Hollice Espy, MD;  Location: [facility] ORS;  Service: Urology;  Laterality: N/A;    There were no vitals filed for this visit.      Subjective Assessment - 08/11/17 2141    Subjective  Patient reports he would like to know how to adjust arms on rollator to be able to sit on it at the breakfast table and get as close as possible.    Patient is accompained by: Family member   Pertinent  History Patient reports he was diagnosed with Parkinson's disease about 10 plus years ago.  He reports a more recent decline in function in his daily activities in the last 6-12 months.  He has been seeing PT for the last couple months.   Patient Stated Goals Patient reports he wants to be able to do more for himself and around the house.    Currently in Pain? No/denies   Pain Score 0-No pain   Multiple Pain Sites No                      OT Treatments/Exercises (OP) - 08/12/17 1339      ADLs   ADL Comments Patient seen for reciprocal stepping patterns at the steps, 10 repetitions, stair negotiation for 5 steps up and down for 3 repetitions.  Posture at the wall with ball raises for 10 repetitions.       Neurological Re-education Exercises   Other Exercises 1 Patient seen for instruction of LSVT BIG exercises: LSVT Daily Session Maximal Daily Exercises: Sustained movements are designed to rescale the amplitude of movement output for generalization to daily functional activities. Performed as follows for 1 set of 10 repetitions each: Multi directional sustained movements- 1) Floor to ceiling, 2) Side to side. Multi directional Repetitive  movements performed in standing and are designed to provide retraining effort needed for sustained muscle activation in tasks Performed as follows: 3) Step and reach forward, 4) Step and Reach Backwards, 5) Step and reach sideways, 6) Rock and reach forward/backward, 7) Rock and reach sideways. Exercises performed in standard version without the use of a chair with min assist to min guard from therapist and verbal cues.   Other Exercises 2 Patient instructed on how to adjust arms on rollator to sit closer to the table.                 OT Education - 08/11/17 2142    Education provided Yes   Education Details adjusting arms of rollator, posture   Person(s) Educated Patient   Methods Explanation;Demonstration;Verbal cues   Comprehension  Verbal cues required;Returned demonstration;Verbalized understanding             OT Long Term Goals - 08/01/17 2015      OT LONG TERM GOAL #1   Title Patient will demonstrate increase right hand grip to be able to cut food with modified independence.    Baseline occasional assist required.   Time 12   Period Weeks   Status Partially Met     OT LONG TERM GOAL #2   Title Patient will complete donning long sleeve shirt with modified independence.   Baseline requires assist with long sleeves, can perform short sleeves.   Time 12   Period Weeks   Status Partially Met     OT LONG TERM GOAL #3   Title Patient will improve coordination to manage buttons on clothing with occasional assistance only.   Baseline requires assist with buttons each time   Time 12   Period Weeks   Status Partially Met     OT LONG TERM GOAL #4   Title Patient will complete managing pants after toileting with modified independence.    Baseline assist most days    Time 12   Period Weeks   Status Achieved     OT LONG TERM GOAL #5   Title Patient will improve hand strength bilaterally to be able to don socks with modified independence.    Baseline unable at evaluation   Time 4   Period Weeks   Status Achieved     OT LONG TERM GOAL #6   Title Patient will improve gait speed and endurance and be able to walk 1050 feet in 6 minutes to negotiate around the home and community safely in 4 weeks   Baseline 2010 at recert 0/7/12   Time 4   Period Weeks   Status Revised     OT LONG TERM GOAL #7   Title Patient will complete HEP for maximal daily exercises with modified independence in 4 weeks   Time 12   Period Weeks   Status Partially Met     OT LONG TERM GOAL #8   Title Patient will transfer from sit to stand without the use of arms safely and independently from a variety of chairs/surfaces in 4 weeks.    Baseline difficulty from lower surfaces   Time 4   Period Weeks   Status Achieved     OT  LONG TERM GOAL  #9   Baseline Patient will demonstrate decreased episodes of freezing of behaviors from score of 16 to score of less than 12.   Time 4   Period Weeks   Status Achieved  Plan - 08/11/17 2142    Clinical Impression Statement Patient continues to progress with exercises in a standard version, no loss of balance this date but still requires min guard when performing exercises for balance. No freezing of gait this date, minimal cues for turning behaviors.  Continue to work towards goals to increase safety and independence with daily tasks.    Rehab Potential Good   Current Impairments/barriers affecting progress: positive: family support, negative: level of motivation, progressive disease   OT Frequency 2x / week   OT Duration 12 weeks   OT Treatment/Interventions Self-care/ADL training;Moist Heat;DME and/or AE instruction;Patient/family education;Therapeutic exercises;Balance training;Therapeutic exercise;Therapeutic activities;Neuromuscular education;Functional Mobility Training;Manual Therapy   Consulted and Agree with Plan of Care Patient   Family Member Consulted wife      Patient will benefit from skilled therapeutic intervention in order to improve the following deficits and impairments:  Abnormal gait, Decreased coordination, Decreased range of motion, Difficulty walking, Decreased endurance, Decreased safety awareness, Decreased activity tolerance, Decreased balance, Impaired UE functional use, Pain, Decreased mobility, Decreased strength  Visit Diagnosis: Difficulty in walking, not elsewhere classified  Muscle weakness (generalized)  Unsteadiness on feet  Other lack of coordination  Abnormal posture    Problem List Patient Active Problem List   Diagnosis Date Noted  . BPH with urinary obstruction 11/14/2016  . Urinary frequency 10/10/2016  . Microscopic hematuria 10/10/2016  . Parkinson's syndrome ([place]) 06/14/2016  . Arthritis  06/14/2016  . Depression 06/14/2016  . Heartburn 06/14/2016  . Urinary incontinence 06/14/2016  . Skin lesion 06/14/2016  . Dementia 06/14/2016  . Bilateral edema of lower extremity 06/14/2016   Achilles Dunk, OTR/L, CLT  Lovett,Amy 08/12/2017, 1:43 PM  [place] MAIN [facility] SERVICES [address] [place], [place], [address] Phone: [phone]   Fax:  [phone]  Name: Luke Durham MRN: [id number] Date of Birth: [birth year]

## 2017-08-14 ENCOUNTER — Ambulatory Visit: Payer: Medicare Other | Admitting: Occupational Therapy

## 2017-08-14 DIAGNOSIS — R2681 Unsteadiness on feet: Secondary | ICD-10-CM

## 2017-08-14 DIAGNOSIS — R293 Abnormal posture: Secondary | ICD-10-CM

## 2017-08-14 DIAGNOSIS — R262 Difficulty in walking, not elsewhere classified: Secondary | ICD-10-CM

## 2017-08-14 DIAGNOSIS — R278 Other lack of coordination: Secondary | ICD-10-CM

## 2017-08-14 DIAGNOSIS — M6281 Muscle weakness (generalized): Secondary | ICD-10-CM

## 2017-08-15 ENCOUNTER — Ambulatory Visit: Payer: Medicare Other | Admitting: Occupational Therapy

## 2017-08-17 ENCOUNTER — Encounter: Payer: Self-pay | Admitting: Occupational Therapy

## 2017-08-17 NOTE — Therapy (Signed)
[place] MAIN [facility] SERVICES [address] [place], [place], [address] Phone: [phone]   Fax:  [phone]  Occupational Therapy Treatment  Patient Details  Name: Deavin Forst MRN: [id number] Date of Birth: 1933-07-13 Referring Provider: Marlyn Corporal  Encounter Date: 08/14/2017      OT End of Session - 08/17/17 1648    Visit Number 65   Number of Visits 81   Date for OT Re-Evaluation 08/18/17   Authorization Type Medicare visit 65 g codes   OT Start Time 5852   OT Stop Time 1530   OT Time Calculation (min) 65 min   Activity Tolerance Patient tolerated treatment well   Behavior During Therapy [facility] for tasks assessed/performed      Past Medical History:  Diagnosis Date  . Anxiety   . Arthritis   . Asthma    childhood asthma  . Cancer ([place]) 7 years ago   lymphoma, [place] (recent)  . Chronic kidney disease   . Colon polyps   . GERD (gastroesophageal reflux disease)   . History of kidney stones   . Parkinson disease [facility])     Past Surgical History:  Procedure Laterality Date  . COLON SURGERY    . CYSTOSCOPY WITH LITHOLAPAXY N/A 11/14/2016   Procedure: CYSTOSCOPY WITH LITHOLAPAXY;  Surgeon: Hollice Espy, MD;  Location: [facility] ORS;  Service: Urology;  Laterality: N/A;  . EYE SURGERY Bilateral    Cataract Extraction with IOL  . HERNIA REPAIR  20 years  . MOHS SURGERY    . SMALL INTESTINE SURGERY     Per patient 7 years  . TRANSURETHRAL RESECTION OF PROSTATE N/A 11/14/2016   Procedure: TRANSURETHRAL RESECTION OF THE PROSTATE (TURP);  Surgeon: Hollice Espy, MD;  Location: [facility] ORS;  Service: Urology;  Laterality: N/A;    There were no vitals filed for this visit.      Subjective Assessment - 08/17/17 1646    Subjective  Patient reports he did not use his rollator when getting up at night but will work on using this week to see if this has an effect on freezing of gait.  Wife reports they will plan to put the walker by the bed.     Pertinent History Patient reports he was diagnosed with Parkinson's disease about 10 plus years ago.  He reports a more recent decline in function in his daily activities in the last 6-12 months.  He has been seeing PT for the last couple months.   Patient Stated Goals Patient reports he wants to be able to do more for himself and around the house.    Currently in Pain? No/denies   Pain Score 0-No pain                      OT Treatments/Exercises (OP) - 08/17/17 1841      ADLs   ADL Comments Patient seen for sit to stand tasks from a variety of surfaces and heights with cues for weight shift and not using arms.  Patient performing posture exercises on the wall but continues to require cues and tactile stretch to achieve results.       Neurological Re-education Exercises   Other Exercises 1 Patient seen for instruction of LSVT BIG exercises: LSVT Daily Session Maximal Daily Exercises: Sustained movements are designed to rescale the amplitude of movement output for generalization to daily functional activities. Performed as follows for 1 set of 10 repetitions each: Multi directional sustained movements- 1) Floor  to ceiling, 2) Side to side. Multi directional Repetitive movements performed in standing and are designed to provide retraining effort needed for sustained muscle activation in tasks Performed as follows: 3) Step and reach forward, 4) Step and Reach Backwards, 5) Step and reach sideways, 6) Rock and reach forward/backward, 7) Rock and reach sideways. Exercises performed in standard version without the use of a chair with min assist to min guard from therapist and verbal cues.   Other Exercises 2 Functional mobility with emphasis on turning in smaller spaces with minimal cues from therapist.  One loss of balance this date with therapist assist for recovery during stepping exercise.                OT Education - 08/17/17 1647    Education provided Yes   Education  Details use of rollator at night when gettting up for toileting   Person(s) Educated Patient;Spouse   Methods Explanation;Demonstration;Verbal cues   Comprehension Verbal cues required;Returned demonstration;Verbalized understanding             OT Long Term Goals - 08/01/17 2015      OT LONG TERM GOAL #1   Title Patient will demonstrate increase right hand grip to be able to cut food with modified independence.    Baseline occasional assist required.   Time 12   Period Weeks   Status Partially Met     OT LONG TERM GOAL #2   Title Patient will complete donning long sleeve shirt with modified independence.   Baseline requires assist with long sleeves, can perform short sleeves.   Time 12   Period Weeks   Status Partially Met     OT LONG TERM GOAL #3   Title Patient will improve coordination to manage buttons on clothing with occasional assistance only.   Baseline requires assist with buttons each time   Time 12   Period Weeks   Status Partially Met     OT LONG TERM GOAL #4   Title Patient will complete managing pants after toileting with modified independence.    Baseline assist most days    Time 12   Period Weeks   Status Achieved     OT LONG TERM GOAL #5   Title Patient will improve hand strength bilaterally to be able to don socks with modified independence.    Baseline unable at evaluation   Time 4   Period Weeks   Status Achieved     OT LONG TERM GOAL #6   Title Patient will improve gait speed and endurance and be able to walk 1050 feet in 6 minutes to negotiate around the home and community safely in 4 weeks   Baseline 7564 at recert 12/25/27   Time 4   Period Weeks   Status Revised     OT LONG TERM GOAL #7   Title Patient will complete HEP for maximal daily exercises with modified independence in 4 weeks   Time 12   Period Weeks   Status Partially Met     OT LONG TERM GOAL #8   Title Patient will transfer from sit to stand without the use of arms  safely and independently from a variety of chairs/surfaces in 4 weeks.    Baseline difficulty from lower surfaces   Time 4   Period Weeks   Status Achieved     OT LONG TERM GOAL  #9   Baseline Patient will demonstrate decreased episodes of freezing of behaviors from score of 16  to score of less than 12.   Time 4   Period Weeks   Status Achieved               Plan - 08/17/17 1648    Clinical Impression Statement Discussed the need for reassessment of skills next session in for patient to start thinking about other activities he feels he needs to focus on. Will plan to update goals and plan of care and determine further need for therapy services. Patient has continued to progress with using new assistive device, a rollator , at home and in the community. He still reports episodes of freezing of gait especially at night when he gets up to go to the bathroom. He often goes the short distance without his rollator and this appears to be when he has the highest incidence of freezing. Therapist has recommended patient use rollator when getting up at night for safety and to decrease the incidence of reasoning behaviors and therefore reducing risk of falls.     Rehab Potential Good   Current Impairments/barriers affecting progress: positive: family support, negative: level of motivation, progressive disease   OT Frequency 2x / week   OT Duration 12 weeks   OT Treatment/Interventions Self-care/ADL training;Moist Heat;DME and/or AE instruction;Patient/family education;Therapeutic exercises;Balance training;Therapeutic exercise;Therapeutic activities;Neuromuscular education;Functional Mobility Training;Manual Therapy   Consulted and Agree with Plan of Care Patient   Family Member Consulted wife      Patient will benefit from skilled therapeutic intervention in order to improve the following deficits and impairments:  Abnormal gait, Decreased coordination, Decreased range of motion, Difficulty  walking, Decreased endurance, Decreased safety awareness, Decreased activity tolerance, Decreased balance, Impaired UE functional use, Pain, Decreased mobility, Decreased strength  Visit Diagnosis: Difficulty in walking, not elsewhere classified  Muscle weakness (generalized)  Unsteadiness on feet  Other lack of coordination  Abnormal posture    Problem List Patient Active Problem List   Diagnosis Date Noted  . BPH with urinary obstruction 11/14/2016  . Urinary frequency 10/10/2016  . Microscopic hematuria 10/10/2016  . Parkinson's syndrome ([place]) 06/14/2016  . Arthritis 06/14/2016  . Depression 06/14/2016  . Heartburn 06/14/2016  . Urinary incontinence 06/14/2016  . Skin lesion 06/14/2016  . Dementia 06/14/2016  . Bilateral edema of lower extremity 06/14/2016   Achilles Dunk, OTR/L, CLT  Lovett,Amy 08/17/2017, 6:47 PM  [place] MAIN [facility] SERVICES [address] [place], [place], [address] Phone: [phone]   Fax:  [phone]  Name: Dorien Mayotte MRN: [id number] Date of Birth: Feb 22, 1933

## 2017-08-18 ENCOUNTER — Ambulatory Visit: Payer: Medicare Other | Admitting: Occupational Therapy

## 2017-08-18 DIAGNOSIS — R262 Difficulty in walking, not elsewhere classified: Secondary | ICD-10-CM

## 2017-08-18 DIAGNOSIS — R2681 Unsteadiness on feet: Secondary | ICD-10-CM

## 2017-08-18 DIAGNOSIS — R278 Other lack of coordination: Secondary | ICD-10-CM

## 2017-08-18 DIAGNOSIS — M6281 Muscle weakness (generalized): Secondary | ICD-10-CM

## 2017-08-18 DIAGNOSIS — R293 Abnormal posture: Secondary | ICD-10-CM

## 2017-08-19 ENCOUNTER — Encounter: Payer: Self-pay | Admitting: Occupational Therapy

## 2017-08-19 NOTE — Therapy (Addendum)
[place] MAIN [facility] SERVICES [address] [place], [place], [address] Phone: [phone]   Fax:  [phone]  Occupational Therapy Treatment  Patient Details  Name: Luke Durham MRN: [id number] Date of Birth: 02-23-1933 Referring Provider: Marlyn Corporal  Encounter Date: 08/18/2017   OT End of Session - 08/18/17 1648   Visit Number 26  Number of Visits 95  Date for OT Re-Evaluation 11/10/17  Authorization Type Medicare visit 65 g codes  OT Start Time 1200  OT Stop Time 1300  OT Time Calculation (min) 60 min  Activity Tolerance Patient tolerated treatment well  Behavior During Therapy [facility] for tasks assessed/performed      Past Medical History:  Diagnosis Date  . Anxiety   . Arthritis   . Asthma    childhood asthma  . Cancer ([place]) 7 years ago   lymphoma, [place] (recent)  . Chronic kidney disease   . Colon polyps   . GERD (gastroesophageal reflux disease)   . History of kidney stones   . Parkinson disease [facility])     Past Surgical History:  Procedure Laterality Date  . COLON SURGERY    . CYSTOSCOPY WITH LITHOLAPAXY N/A 11/14/2016   Procedure: CYSTOSCOPY WITH LITHOLAPAXY;  Surgeon: Hollice Espy, MD;  Location: [facility] ORS;  Service: Urology;  Laterality: N/A;  . EYE SURGERY Bilateral    Cataract Extraction with IOL  . HERNIA REPAIR  20 years  . MOHS SURGERY    . SMALL INTESTINE SURGERY     Per patient 7 years  . TRANSURETHRAL RESECTION OF PROSTATE N/A 11/14/2016   Procedure: TRANSURETHRAL RESECTION OF THE PROSTATE (TURP);  Surgeon: Hollice Espy, MD;  Location: [facility] ORS;  Service: Urology;  Laterality: N/A;    There were no vitals filed for this visit.     Subjective Assessment - 08/17/17 1646     Subjective  Patient reports they put the walker by the bed and he did well with getting up over the last few nights, decreased freezing of gait noted. He feels he is doing better and feels the need to continue therapy.    Pertinent  History Patient reports he was diagnosed with Parkinson's disease about 10 plus years ago.  He reports a more recent decline in function in his daily activities in the last 6-12 months.  He has been seeing PT for the last couple months.    Patient Stated Goals Patient reports he wants to be able to do more for himself and around the house.     Currently in Pain? No/denies    Pain Score 0-No pain                         OT Treatments/Exercises (OP) - 08/18/17 1841              ADLs    ADL Comments Self care reassessment:  Patient requires set up for bathing, assist with drying off and supervision for transfer.   Dressing:  patient able to perform all tasks with increased time and assistance for buttons. Some assist with winter coats to don and doff.  Grooming:  independent.  Eating:  independent, occasional assist with cutting meat, is able to cut fish.  He has been more consistent with his medication management however he forgot to take meds one time this week and reports shakiness and unsteadiness.            Neurological Re-education Exercises    Other  Exercises 1 Patient seen for instruction of LSVT BIG exercises: LSVT Daily Session Maximal Daily Exercises: Sustained movements are designed to rescale the amplitude of movement output for generalization to daily functional activities. Performed as follows for 1 set of 10 repetitions each: Multi directional sustained movements- 1) Floor to ceiling, 2) Side to side. Multi directional Repetitive movements performed in standing and are designed to provide retraining effort needed for sustained muscle activation in tasks Performed as follows: 3) Step and reach forward, 4) Step and Reach Backwards, 5) Step and reach sideways, 6) Rock and reach forward/backward, 7) Rock and reach sideways. Exercises performed in standard version without the use of a chair with min assist to min guard from therapist and verbal cues.    Other Exercises 2 Reassessment of  ROM/strength:  Right grip strength 44#, left 45#.  Right lateral pinch 13#, left 11#, 3 point pinch right 13#, left 11#.  Blood pressure 104/76 this date.  5 times sit to stand 16 secs.  Right shoulder flexion to 130 degrees, left 125 degrees of motion.                      OT Education - 08/18/17 1647     Education provided Yes    Education Details use of rollator at night when gettting up for toileting    Person(s) Educated Patient;Spouse    Methods Explanation;Demonstration;Verbal cues    Comprehension Verbal cues required;Returned demonstration;Verbalized understanding         OT Long Term Goals - 08/18/17 2015                 OT LONG TERM GOAL #1    Title Patient will demonstrate increase right hand grip to be able to cut food with modified independence.     Baseline occasional assist required.    Time 12    Period Weeks    Status Partially Met         OT LONG TERM GOAL #2    Title Patient will complete donning long sleeve shirt with modified independence.    Baseline requires assist with long sleeves, can perform short sleeves.      Time 12    Period Weeks    Status  Met         OT LONG TERM GOAL #3    Title Patient will improve coordination to manage buttons on clothing with occasional assistance only.    Baseline requires assist with buttons each time    Time 12    Period Weeks    Status Partially Met         OT LONG TERM GOAL #4    Title Patient will complete managing pants after toileting with modified independence.     Baseline assist most days     Time 12    Period Weeks    Status Achieved         OT LONG TERM GOAL #5    Title Patient will improve hand strength bilaterally to be able to don socks with modified independence.     Baseline unable at evaluation    Time 4    Period Weeks    Status Achieved         OT LONG TERM GOAL #6    Title Patient will improve gait speed and endurance and be able to walk 1050 feet in 6 minutes to negotiate around  the home and community safely in 4 weeks  Baseline 4715 at recert 06/28/38    Time 4    Period Weeks    Status Revised         OT LONG TERM GOAL #7    Title Patient will complete HEP for maximal daily exercises with modified independence in 4 weeks    Time 12    Period Weeks    Status Partially Met         OT LONG TERM GOAL #8    Title Patient will transfer from sit to stand without the use of arms safely and independently from a variety of chairs/surfaces in 4 weeks.     Baseline difficulty from lower surfaces    Time 4    Period Weeks    Status Achieved         OT LONG TERM GOAL  #9    Baseline Patient will demonstrate decreased episodes of freezing of behaviors from score of 16 to score of less than 12.    Time 4    Period Weeks    Status Achieved                   Plan - 08/18/17 1648     Clinical Impression Statement Patient was seen this date for reassessment of skills as follows:  Improvements in patients performance in the last recertification period:  transitions for sit to stand from a variety of surfaces, his 5 times sit to stand continues to improve, completed in 16 secs this date.  Turning in small spaces has improved with use of new rollator, however, patient continues to require cues for self positioning when turning more sharply, feet tend to remain outside of the walker.  He responds well to cues for correction and will need to continue to work towards repetition of task to integrate into daily habit and routine for safety.  Functional mobility in the community has improved with use of new rollator, both patient and wife report increased ease getting into and out of restaurants and improved gait.  Items to continue to work on include:  Holding and handling the newspaper, patient reads the newspaper daily and has difficulty with holding and turning pages and folding the paper back at times.  Lower surfaces for sit to stand remain challenging and needs more intentional  work in this area to be able to generalize technique to all potential encounters.  Buttons remain a challenge and patient requires assistance from his wife to complete buttons during self care tasks.   Freezing of gait at night remains an issue however, he has improved with stability and decreased episodes of incontinence when remembering to use his walker.  Patient continues to benefit from skilled OT to maximize safety and independence in daily tasks and to reduce fall risk.  He is still getting used to using a new assistive device and is advancing in performance of daily exercises.       Rehab Potential Good    Current Impairments/barriers affecting progress: positive: family support, negative: level of motivation, progressive disease    OT Frequency 2x / week    OT Duration 12 weeks    OT Treatment/Interventions Self-care/ADL training;Moist Heat;DME and/or AE instruction;Patient/family education;Therapeutic exercises;Balance training;Therapeutic exercise;Therapeutic activities;Neuromuscular education;Functional Mobility Training;Manual Therapy    Consulted and Agree with Plan of Care Patient    Family Member Consulted wife  Patient will benefit from skilled therapeutic intervention in order to improve the following deficits and impairments:  Abnormal gait, Decreased coordination, Decreased range of motion, Difficulty walking, Decreased endurance, Decreased safety awareness, Decreased activity tolerance, Decreased balance, Impaired UE functional use, Pain, Decreased mobility, Decreased strength Visit Diagnosis: Difficulty in walking, not elsewhere classified  Muscle weakness (generalized)  Unsteadiness on feet  Other lack of coordination  Abnormal posture    Problem List Patient Active Problem List   Diagnosis Date Noted  . BPH with urinary obstruction 11/14/2016  . Urinary frequency 10/10/2016  . Microscopic  hematuria 10/10/2016  . Parkinson's syndrome ([place]) 06/14/2016  . Arthritis 06/14/2016  . Depression 06/14/2016  . Heartburn 06/14/2016  . Urinary incontinence 06/14/2016  . Skin lesion 06/14/2016  . Dementia 06/14/2016  . Bilateral edema of lower extremity 06/14/2016   Achilles Dunk, OTR/L, CLT  Lovett,Amy 08/19/2017, 10:16 PM  [place] MAIN [facility] SERVICES [address] [place], [place], [address] Phone: [phone]   Fax:  [phone]  Name: Luke Durham MRN: [id number] Date of Birth: 30-Dec-1932

## 2017-08-22 ENCOUNTER — Ambulatory Visit: Payer: Medicare Other | Admitting: Occupational Therapy

## 2017-08-22 DIAGNOSIS — R278 Other lack of coordination: Secondary | ICD-10-CM

## 2017-08-22 DIAGNOSIS — R262 Difficulty in walking, not elsewhere classified: Secondary | ICD-10-CM | POA: Diagnosis not present

## 2017-08-22 DIAGNOSIS — R293 Abnormal posture: Secondary | ICD-10-CM

## 2017-08-22 DIAGNOSIS — R2681 Unsteadiness on feet: Secondary | ICD-10-CM

## 2017-08-22 DIAGNOSIS — M6281 Muscle weakness (generalized): Secondary | ICD-10-CM

## 2017-08-23 NOTE — Therapy (Signed)
[place] MAIN [facility] SERVICES [address] [place], [place], [address] Phone: [phone]   Fax:  [phone]  Occupational Therapy Treatment  Patient Details  Name: Luke Durham MRN: [id number] Date of Birth: Jul 04, 1933 Referring Provider: Marlyn Corporal  Encounter Date: 08/22/2017      OT End of Session - 08/23/17 1044    Visit Number 42   Number of Visits 95   Date for OT Re-Evaluation 11/10/17   Authorization Type Medicare visit 67 g codes   OT Start Time 1400   OT Stop Time 1500   OT Time Calculation (min) 60 min   Activity Tolerance Patient tolerated treatment well   Behavior During Therapy [facility] for tasks assessed/performed      Past Medical History:  Diagnosis Date  . Anxiety   . Arthritis   . Asthma    childhood asthma  . Cancer ([place]) 7 years ago   lymphoma, [place] (recent)  . Chronic kidney disease   . Colon polyps   . GERD (gastroesophageal reflux disease)   . History of kidney stones   . Parkinson disease [facility])     Past Surgical History:  Procedure Laterality Date  . COLON SURGERY    . CYSTOSCOPY WITH LITHOLAPAXY N/A 11/14/2016   Procedure: CYSTOSCOPY WITH LITHOLAPAXY;  Surgeon: Hollice Espy, MD;  Location: [facility] ORS;  Service: Urology;  Laterality: N/A;  . EYE SURGERY Bilateral    Cataract Extraction with IOL  . HERNIA REPAIR  20 years  . MOHS SURGERY    . SMALL INTESTINE SURGERY     Per patient 7 years  . TRANSURETHRAL RESECTION OF PROSTATE N/A 11/14/2016   Procedure: TRANSURETHRAL RESECTION OF THE PROSTATE (TURP);  Surgeon: Hollice Espy, MD;  Location: [facility] ORS;  Service: Urology;  Laterality: N/A;    There were no vitals filed for this visit.      Subjective Assessment - 08/23/17 1040    Subjective  Patient reports he is doing well today, no complaints.  Had a good weekend, still using walker when getting up at night. Denies any falls.    Pertinent History Patient reports he was diagnosed with Parkinson's  disease about 10 plus years ago.  He reports a more recent decline in function in his daily activities in the last 6-12 months.  He has been seeing PT for the last couple months.   Patient Stated Goals Patient reports he wants to be able to do more for himself and around the house.    Currently in Pain? No/denies   Pain Score 0-No pain                      OT Treatments/Exercises (OP) - 08/23/17 1040      Neurological Re-education Exercises   Other Exercises 1 Patient seen for instruction of LSVT BIG exercises: LSVT Daily Session Maximal Daily Exercises: Sustained movements are designed to rescale the amplitude of movement output for generalization to daily functional activities. Performed as follows for 1 set of 10 repetitions each: Multi directional sustained movements- 1) Floor to ceiling, 2) Side to side. Multi directional Repetitive movements performed in standing and are designed to provide retraining effort needed for sustained muscle activation in tasks Performed as follows: 3) Step and reach forward, 4) Step and Reach Backwards, 5) Step and reach sideways, 6) Rock and reach forward/backward, 7) Rock and reach sideways. Exercises performed in standard version without the use of a chair with min assist to min guard  from therapist and verbal cues.   Other Exercises 2 Stair negotiation for 5 steps up and down for 5 trials with supervision and cues. Reciprocal toe tapping with min guard and cues for amplitude of step to clear height with foot after repeated trials.  Patient also seen for turning during mobility to change directions and manuever around objects with minimal cues for positioning of self.                 OT Education - 08/23/17 1044    Education provided Yes   Education Details amplitude of steps, positioning, posture   Person(s) Educated Patient   Methods Explanation;Demonstration;Verbal cues   Comprehension Verbal cues required;Returned  demonstration;Verbalized understanding                    Plan - 08/23/17 1045    Clinical Impression Statement Patient continues to require verbal cues for positioning of self in relation to rollator when turning, given the swivel wheels, he tends to turn sharply now and is outside the base of the walker and has difficulty with getting back in line with the rollator.  He responds well to cues and needs continued reinforcement for it to become automatic.  No significant freezing of gait this date.  Continue to work towards updated goals in plan of care.    Rehab Potential Good   Current Impairments/barriers affecting progress: positive: family support, negative: level of motivation, progressive disease   OT Frequency 2x / week   OT Duration 12 weeks   OT Treatment/Interventions Self-care/ADL training;Moist Heat;DME and/or AE instruction;Patient/family education;Therapeutic exercises;Balance training;Therapeutic exercise;Therapeutic activities;Neuromuscular education;Functional Mobility Training;Manual Therapy   Consulted and Agree with Plan of Care Patient      Patient will benefit from skilled therapeutic intervention in order to improve the following deficits and impairments:  Abnormal gait, Decreased coordination, Decreased range of motion, Difficulty walking, Decreased endurance, Decreased safety awareness, Decreased activity tolerance, Decreased balance, Impaired UE functional use, Pain, Decreased mobility, Decreased strength  Visit Diagnosis: Difficulty in walking, not elsewhere classified  Muscle weakness (generalized)  Unsteadiness on feet  Other lack of coordination  Abnormal posture    Problem List Patient Active Problem List   Diagnosis Date Noted  . BPH with urinary obstruction 11/14/2016  . Urinary frequency 10/10/2016  . Microscopic hematuria 10/10/2016  . Parkinson's syndrome ([place]) 06/14/2016  . Arthritis 06/14/2016  . Depression 06/14/2016  .  Heartburn 06/14/2016  . Urinary incontinence 06/14/2016  . Skin lesion 06/14/2016  . Dementia 06/14/2016  . Bilateral edema of lower extremity 06/14/2016   Achilles Dunk, OTR/L, CLT  Lovett,Amy 08/23/2017, 10:50 AM  [place] MAIN [facility] SERVICES [address] [place], [place], [address] Phone: [phone]   Fax:  [phone]  Name: Luke Durham MRN: [id number] Date of Birth: [birth year]

## 2017-08-25 ENCOUNTER — Ambulatory Visit: Payer: Medicare Other | Attending: Neurology | Admitting: Occupational Therapy

## 2017-08-25 DIAGNOSIS — G8929 Other chronic pain: Secondary | ICD-10-CM | POA: Insufficient documentation

## 2017-08-25 DIAGNOSIS — R262 Difficulty in walking, not elsewhere classified: Secondary | ICD-10-CM | POA: Insufficient documentation

## 2017-08-25 DIAGNOSIS — R2681 Unsteadiness on feet: Secondary | ICD-10-CM | POA: Insufficient documentation

## 2017-08-25 DIAGNOSIS — M6281 Muscle weakness (generalized): Secondary | ICD-10-CM | POA: Diagnosis present

## 2017-08-25 DIAGNOSIS — M25511 Pain in right shoulder: Secondary | ICD-10-CM | POA: Diagnosis present

## 2017-08-25 DIAGNOSIS — R278 Other lack of coordination: Secondary | ICD-10-CM | POA: Insufficient documentation

## 2017-08-25 DIAGNOSIS — R293 Abnormal posture: Secondary | ICD-10-CM | POA: Diagnosis present

## 2017-08-29 ENCOUNTER — Ambulatory Visit: Payer: Medicare Other | Admitting: Occupational Therapy

## 2017-08-29 DIAGNOSIS — R278 Other lack of coordination: Secondary | ICD-10-CM

## 2017-08-29 DIAGNOSIS — R262 Difficulty in walking, not elsewhere classified: Secondary | ICD-10-CM

## 2017-08-29 DIAGNOSIS — M6281 Muscle weakness (generalized): Secondary | ICD-10-CM

## 2017-08-29 DIAGNOSIS — R293 Abnormal posture: Secondary | ICD-10-CM

## 2017-08-29 DIAGNOSIS — R2681 Unsteadiness on feet: Secondary | ICD-10-CM

## 2017-09-01 ENCOUNTER — Ambulatory Visit: Payer: Medicare Other | Admitting: Occupational Therapy

## 2017-09-02 ENCOUNTER — Encounter: Payer: Self-pay | Admitting: Occupational Therapy

## 2017-09-02 NOTE — Therapy (Addendum)
[place] MAIN [facility] SERVICES [address] [place], [place], [address] Phone: [phone]   Fax:  [phone]  Occupational Therapy Treatment  Patient Details  Name: Luke Durham MRN: [id number] Date of Birth: July 09, 1933 Referring Provider: Marlyn Corporal   Encounter Date: 08/25/2017  OT End of Session - 09/02/17 1836    Visit Number  32    Number of Visits  95    Date for OT Re-Evaluation  11/10/17    Authorization Type  Medicare visit 68 g codes    OT Start Time  1100    OT Stop Time  1201    OT Time Calculation (min)  61 min    Activity Tolerance  Patient tolerated treatment well    Behavior During Therapy  [facility] for tasks assessed/performed       Past Medical History:  Diagnosis Date  . Anxiety   . Arthritis   . Asthma    childhood asthma  . Cancer ([place]) 7 years ago   lymphoma, [place] (recent)  . Chronic kidney disease   . Colon polyps   . GERD (gastroesophageal reflux disease)   . History of kidney stones   . Parkinson disease [facility])     Past Surgical History:  Procedure Laterality Date  . COLON SURGERY    . EYE SURGERY Bilateral    Cataract Extraction with IOL  . HERNIA REPAIR  20 years  . MOHS SURGERY    . SMALL INTESTINE SURGERY     Per patient 7 years    There were no vitals filed for this visit.  Subjective Assessment - 09/02/17 1834    Subjective   Patient reports his neice is coming to visit soon.    Pertinent History  Patient reports he was diagnosed with Parkinson's disease about 10 plus years ago.  He reports a more recent decline in function in his daily activities in the last 6-12 months.  He has been seeing PT for the last couple months.    Patient Stated Goals  Patient reports he wants to be able to do more for himself and around the house.     Currently in Pain?  No/denies    Pain Score  0-No pain    Multiple Pain Sites  No                   OT Treatments/Exercises (OP) - 09/02/17 1835       Neurological Re-education Exercises   Other Exercises 1  Patient seen for instruction of LSVT BIG exercises: LSVT Daily Session Maximal Daily Exercises: Sustained movements are designed to rescale the amplitude of movement output for generalization to daily functional activities. Performed as follows for 1 set of 10 repetitions each: Multi directional sustained movements- 1) Floor to ceiling, 2) Side to side. Multi directional Repetitive movements performed in standing and are designed to provide retraining effort needed for sustained muscle activation in tasks Performed as follows: 3) Step and reach forward, 4) Step and Reach Backwards, 5) Step and reach sideways, 6) Rock and reach forward/backward, 7) Rock and reach sideways. Exercises performed in standard version without the use of a chair with min assist to min guard from therapist and verbal cues.    Other Exercises 2  Stair negotiation for 5 steps up and down for 5 trials with supervision and cues. Reciprocal toe tapping with min guard and cues for amplitude of step to clear height with foot after repeated trials.  Patient also seen  for turning during mobility to change directions and manuever around objects with minimal cues for positioning of self.              OT Education - 09/02/17 1835    Education provided  Yes    Education Details  turning, use of rollator    Person(s) Educated  Patient    Methods  Explanation;Demonstration;Verbal cues    Comprehension  Verbal cues required;Returned demonstration;Verbalized understanding          OT Long Term Goals - 09/02/17 1837      OT LONG TERM GOAL #1   Title  Patient will demonstrate increase right hand grip to be able to cut food with modified independence.     Baseline  occasional assist required.    Time  12    Period  Weeks    Status  Partially Met      OT LONG TERM GOAL #2   Title  Patient will complete donning long sleeve shirt with modified independence.    Baseline   requires assist with long sleeves, can perform short sleeves.    Time  12    Period  Weeks    Status  Achieved     OT LONG TERM GOAL #3   Title  Patient will improve coordination to manage buttons on clothing with occasional assistance only.    Baseline  requires assist with buttons each time    Time  12    Period  Weeks    Status  Partially Met      OT LONG TERM GOAL #4   Title  Patient will complete managing pants after toileting with modified independence.     Baseline  assist most days     Time  12    Period  Weeks    Status  Achieved      OT LONG TERM GOAL #5   Title  Patient will improve hand strength bilaterally to be able to don socks with modified independence.     Baseline  unable at evaluation    Time  4    Period  Weeks    Status  Achieved      OT LONG TERM GOAL #6   Title  Patient will improve gait speed and endurance and be able to walk 1050 feet in 6 minutes to negotiate around the home and community safely in 4 weeks    Baseline  0762 at recert 11/30/31    Time  4    Period  Weeks    Status  Revised      OT LONG TERM GOAL #7   Title  Patient will complete HEP for maximal daily exercises with modified independence in 4 weeks    Time  12    Period  Weeks    Status  Partially Met      OT LONG TERM GOAL #8   Title  Patient will transfer from sit to stand without the use of arms safely and independently from a variety of chairs/surfaces in 4 weeks.     Baseline  difficulty from lower surfaces    Time  4    Period  Weeks    Status  Achieved      OT LONG TERM GOAL  #9   Baseline  Patient will demonstrate decreased episodes of freezing of behaviors from score of 16 to score of less than 12.    Time  4    Period  Weeks  Status  Achieved            Plan - 09/02/17 1836    Clinical Impression Statement  Patient continues to progress in all areas.  He is improving with balance during standard version of exercises without the use of a chair.  He responds  well to cues for turning with his rollator but if not cued, he often turns sharply without feet being behind the rollator.  Able to correct once pattern is pointed out but needs to work on consistency.  Continue to work towards goals to increase independence and safety in daily tasks.    Occupational performance deficits (Please refer to evaluation for details):  ADL's;IADL's;Leisure    Rehab Potential  Good    Current Impairments/barriers affecting progress:  positive: family support, negative: level of motivation, progressive disease    OT Frequency  2x / week    OT Duration  12 weeks    OT Treatment/Interventions  Self-care/ADL training;Moist Heat;DME and/or AE instruction;Patient/family education;Therapeutic exercises;Balance training;Therapeutic exercise;Therapeutic activities;Neuromuscular education;Functional Mobility Training;Manual Therapy    Consulted and Agree with Plan of Care  Patient       Patient will benefit from skilled therapeutic intervention in order to improve the following deficits and impairments:  Abnormal gait, Decreased coordination, Decreased range of motion, Difficulty walking, Decreased endurance, Decreased safety awareness, Decreased activity tolerance, Decreased balance, Impaired UE functional use, Pain, Decreased mobility, Decreased strength  Visit Diagnosis: Difficulty in walking, not elsewhere classified  Muscle weakness (generalized)  Unsteadiness on feet  Other lack of coordination  Abnormal posture    Problem List Patient Active Problem List   Diagnosis Date Noted  . BPH with urinary obstruction 11/14/2016  . Urinary frequency 10/10/2016  . Microscopic hematuria 10/10/2016  . Parkinson's syndrome ([place]) 06/14/2016  . Arthritis 06/14/2016  . Depression 06/14/2016  . Heartburn 06/14/2016  . Urinary incontinence 06/14/2016  . Skin lesion 06/14/2016  . Dementia 06/14/2016  . Bilateral edema of lower extremity 06/14/2016   Achilles Dunk, OTR/L,  CLT  Lovett,Amy 09/02/2017, 6:38 PM  [place] MAIN [facility] SERVICES [address] [place], [place], [address] Phone: [phone]   Fax:  [phone]  Name: Eryck Negron MRN: [id number] Date of Birth: 17-Aug-1933

## 2017-09-02 NOTE — Therapy (Addendum)
[place] MAIN [facility] SERVICES [address] [place], [place], [address] Phone: [phone]   Fax:  [phone]  Occupational Therapy Treatment  Patient Details  Name: Luke Durham MRN: [id number] Date of Birth: [birth year] Referring Provider: Marlyn Corporal   Encounter Date: 08/29/2017  OT End of Session - 09/02/17 1841    Visit Number  80    Number of Visits  95    Date for OT Re-Evaluation  11/10/17    Authorization Type  Medicare visit 69 g codes    OT Start Time  1100    OT Stop Time  1200    OT Time Calculation (min)  60 min    Activity Tolerance  Patient tolerated treatment well    Behavior During Therapy  [facility] for tasks assessed/performed       Past Medical History:  Diagnosis Date  . Anxiety   . Arthritis   . Asthma    childhood asthma  . Cancer ([place]) 7 years ago   lymphoma, [place] (recent)  . Chronic kidney disease   . Colon polyps   . GERD (gastroesophageal reflux disease)   . History of kidney stones   . Parkinson disease [facility])     Past Surgical History:  Procedure Laterality Date  . COLON SURGERY    . EYE SURGERY Bilateral    Cataract Extraction with IOL  . HERNIA REPAIR  20 years  . MOHS SURGERY    . SMALL INTESTINE SURGERY     Per patient 7 years    There were no vitals filed for this visit.  Subjective Assessment - 09/02/17 1839    Subjective   Patient reports his wife is having surgery on Thursday for carpal tunnel and he is not sure if they will be able to come to therapy on Friday.  Will call and let us know.     Pertinent History  Patient reports he was diagnosed with Parkinson's disease about 10 plus years ago.  He reports a more recent decline in function in his daily activities in the last 6-12 months.  He has been seeing PT for the last couple months.    Patient Stated Goals  Patient reports he wants to be able to do more for himself and around the house.     Currently in Pain?  No/denies    Pain Score   0-No pain    Multiple Pain Sites  No                   OT Treatments/Exercises (OP) - 09/02/17 1840      Neurological Re-education Exercises   Other Exercises 1  Patient seen for instruction of LSVT BIG exercises: LSVT Daily Session Maximal Daily Exercises: Sustained movements are designed to rescale the amplitude of movement output for generalization to daily functional activities. Performed as follows for 1 set of 10 repetitions each: Multi directional sustained movements- 1) Floor to ceiling, 2) Side to side. Multi directional Repetitive movements performed in standing and are designed to provide retraining effort needed for sustained muscle activation in tasks Performed as follows: 3) Step and reach forward, 4) Step and Reach Backwards, 5) Step and reach sideways, 6) Rock and reach forward/backward, 7) Rock and reach sideways. Exercises performed in standard version without the use of a chair with min assist to min guard from therapist and verbal cues.    Other Exercises 2  Stair negotiation for 5 steps up and down for 5 trials with  supervision and cues. Reciprocal toe tapping with min guard and cues for amplitude of step to clear height with foot after repeated trials.  Patient also seen for turning during mobility to change directions and manuever around objects with minimal cues for positioning of self.              OT Education - 09/02/17 1841    Education provided  Yes    Education Details  HEP    Person(s) Educated  Patient    Methods  Explanation;Demonstration;Verbal cues    Comprehension  Verbal cues required;Returned demonstration;Verbalized understanding          OT Long Term Goals - 09/02/17 1837      OT LONG TERM GOAL #1   Title  Patient will demonstrate increase right hand grip to be able to cut food with modified independence.     Baseline  occasional assist required.    Time  12    Period  Weeks    Status  Partially Met      OT LONG TERM GOAL #2    Title  Patient will complete donning long sleeve shirt with modified independence.    Baseline  requires assist with long sleeves, can perform short sleeves.    Time  12    Period  Weeks    Status  Achieved     OT LONG TERM GOAL #3   Title  Patient will improve coordination to manage buttons on clothing with occasional assistance only.    Baseline  requires assist with buttons each time    Time  12    Period  Weeks    Status  Partially Met      OT LONG TERM GOAL #4   Title  Patient will complete managing pants after toileting with modified independence.     Baseline  assist most days     Time  12    Period  Weeks    Status  Achieved      OT LONG TERM GOAL #5   Title  Patient will improve hand strength bilaterally to be able to don socks with modified independence.     Baseline  unable at evaluation    Time  4    Period  Weeks    Status  Achieved      OT LONG TERM GOAL #6   Title  Patient will improve gait speed and endurance and be able to walk 1050 feet in 6 minutes to negotiate around the home and community safely in 4 weeks    Baseline  3532 at recert 07/03/23    Time  4    Period  Weeks    Status  Revised      OT LONG TERM GOAL #7   Title  Patient will complete HEP for maximal daily exercises with modified independence in 4 weeks    Time  12    Period  Weeks    Status  Partially Met      OT LONG TERM GOAL #8   Title  Patient will transfer from sit to stand without the use of arms safely and independently from a variety of chairs/surfaces in 4 weeks.     Baseline  difficulty from lower surfaces    Time  4    Period  Weeks    Status  Achieved      OT LONG TERM GOAL  #9   Baseline  Patient will demonstrate decreased episodes of freezing of  behaviors from score of 16 to score of less than 12.    Time  4    Period  Weeks    Status  Achieved            Plan - 09/02/17 1842    Clinical Impression Statement  Patient becoming more consistent with exercises in  standard version in the clinic.  For safety reasons, he will continue with adapted version of exercises at home while we continue to work on improved balance and safety.  Patient continues to focus on turning behaviors with rollator and self positioning.  Continue to work towards goals.     Rehab Potential  Good    Current Impairments/barriers affecting progress:  positive: family support, negative: level of motivation, progressive disease    OT Frequency  2x / week    OT Duration  12 weeks    OT Treatment/Interventions  Self-care/ADL training;Moist Heat;DME and/or AE instruction;Patient/family education;Therapeutic exercises;Balance training;Therapeutic exercise;Therapeutic activities;Neuromuscular education;Functional Mobility Training;Manual Therapy    Consulted and Agree with Plan of Care  Patient       Patient will benefit from skilled therapeutic intervention in order to improve the following deficits and impairments:  Abnormal gait, Decreased coordination, Decreased range of motion, Difficulty walking, Decreased endurance, Decreased safety awareness, Decreased activity tolerance, Decreased balance, Impaired UE functional use, Pain, Decreased mobility, Decreased strength  Visit Diagnosis: Difficulty in walking, not elsewhere classified  Muscle weakness (generalized)  Unsteadiness on feet  Other lack of coordination  Abnormal posture    Problem List Patient Active Problem List   Diagnosis Date Noted  . BPH with urinary obstruction 11/14/2016  . Urinary frequency 10/10/2016  . Microscopic hematuria 10/10/2016  . Parkinson's syndrome ([place]) 06/14/2016  . Arthritis 06/14/2016  . Depression 06/14/2016  . Heartburn 06/14/2016  . Urinary incontinence 06/14/2016  . Skin lesion 06/14/2016  . Dementia 06/14/2016  . Bilateral edema of lower extremity 06/14/2016   Achilles Dunk, OTR/L, CLT  Lovett,Amy 09/02/2017, 6:43 PM  [place] MAIN [facility]  SERVICES [address] [place], [place], [address] Phone: [phone]   Fax:  [phone]  Name: Luke Durham MRN: [id number] Date of Birth: Jun 19, 1933

## 2017-09-05 ENCOUNTER — Ambulatory Visit: Payer: Medicare Other | Admitting: Occupational Therapy

## 2017-09-05 DIAGNOSIS — R278 Other lack of coordination: Secondary | ICD-10-CM

## 2017-09-05 DIAGNOSIS — R262 Difficulty in walking, not elsewhere classified: Secondary | ICD-10-CM

## 2017-09-05 DIAGNOSIS — M25511 Pain in right shoulder: Secondary | ICD-10-CM

## 2017-09-05 DIAGNOSIS — R2681 Unsteadiness on feet: Secondary | ICD-10-CM

## 2017-09-05 DIAGNOSIS — G8929 Other chronic pain: Secondary | ICD-10-CM

## 2017-09-05 DIAGNOSIS — R293 Abnormal posture: Secondary | ICD-10-CM

## 2017-09-05 DIAGNOSIS — M6281 Muscle weakness (generalized): Secondary | ICD-10-CM

## 2017-09-07 ENCOUNTER — Encounter: Payer: Medicare Other | Admitting: Occupational Therapy

## 2017-09-08 ENCOUNTER — Ambulatory Visit: Payer: Medicare Other | Admitting: Occupational Therapy

## 2017-09-12 ENCOUNTER — Ambulatory Visit: Payer: Medicare Other | Admitting: Occupational Therapy

## 2017-09-12 ENCOUNTER — Ambulatory Visit
Admission: RE | Admit: 2017-09-12 | Discharge: 2017-09-12 | Disposition: A | Discharge status: Home / Self Care | Payer: Medicare Other | Source: Ambulatory Visit | Attending: Urology | Admitting: Urology

## 2017-09-12 ENCOUNTER — Ambulatory Visit: Admission: RE | Admit: 2017-09-12 | Payer: Medicare Other | Source: Ambulatory Visit | Admitting: Urology

## 2017-09-12 ENCOUNTER — Ambulatory Visit (INDEPENDENT_AMBULATORY_CARE_PROVIDER_SITE_OTHER): Payer: Medicare Other

## 2017-09-12 ENCOUNTER — Other Ambulatory Visit: Payer: Self-pay

## 2017-09-12 VITALS — BP 113/63 | HR 93 | Ht 67.0 in | Wt 170.0 lb

## 2017-09-12 DIAGNOSIS — Z87448 Personal history of other diseases of urinary system: Secondary | ICD-10-CM | POA: Diagnosis not present

## 2017-09-12 DIAGNOSIS — R310 Gross hematuria: Secondary | ICD-10-CM

## 2017-09-12 LAB — URINALYSIS, COMPLETE
Bilirubin, UA: NEGATIVE
Glucose, UA: NEGATIVE
Nitrite, UA: NEGATIVE
Specific Gravity, UA: 1.015 (ref 1.005–1.030)
Urobilinogen, Ur: 4 mg/dL — ABNORMAL HIGH (ref 0.2–1.0)
pH, UA: 5 (ref 5.0–7.5)

## 2017-09-12 LAB — MICROSCOPIC EXAMINATION
Epithelial Cells (non renal): NONE SEEN /hpf (ref 0–10)
RBC, UA: >30 /hpf — ABNORMAL HIGH (ref 0–?)

## 2017-09-12 NOTE — Addendum Note (Signed)
Addended by: Lestine Box on: 09/12/2017 04:24 PM   Modules accepted: Orders

## 2017-09-12 NOTE — Progress Notes (Addendum)
Pt presented today with gross hematuria. A clean catch was obtained for u/a, cx, and pt was sent to for KUB.   Blood pressure 113/63, pulse 93, height 5\' 7"  (1.702 m), weight 170 lb (77.1 kg).

## 2017-09-13 ENCOUNTER — Other Ambulatory Visit: Payer: Self-pay | Admitting: Physician Assistant

## 2017-09-13 ENCOUNTER — Telehealth: Payer: Self-pay

## 2017-09-13 ENCOUNTER — Encounter: Payer: Self-pay | Admitting: Occupational Therapy

## 2017-09-13 DIAGNOSIS — R600 Localized edema: Secondary | ICD-10-CM

## 2017-09-13 MED ORDER — NITROFURANTOIN MONOHYD MACRO 100 MG PO CAPS: 100.0000 mg | ORAL_CAPSULE | Freq: Two times a day (BID) | ORAL | 0 refills | Status: DC

## 2017-09-13 NOTE — Telephone Encounter (Signed)
Per Dr. Erlene Quan patient's wife was notified that KUB does not show any bladder stones, his UA shows possible infection. Will send for culture and start on ABX and will call next week with culture results. Nitrofuration 100mg  BID for 7 days was sent to patient's local pharm. Patient's wife verbalized understanding and is in agreement with this plan

## 2017-09-13 NOTE — Addendum Note (Signed)
Addended by: Garlon Hatchet T on: 09/13/2017 09:43 AM   Modules accepted: Orders

## 2017-09-15 LAB — CULTURE, URINE COMPREHENSIVE

## 2017-09-16 NOTE — Therapy (Signed)
[place] MAIN [facility] SERVICES [address] [place], [place], [address] Phone: [phone]   Fax:  [phone]  Occupational Therapy Treatment/Progress Note   Patient Details  Name: Luke Durham MRN: [id number] Date of Birth: 15-Nov-1932 Referring Provider: Marlyn Corporal   Encounter Date: 09/05/2017  OT End of Session - 09/16/17 1305    Visit Number  40    Number of Visits  95    Date for OT Re-Evaluation  11/10/17    Authorization Type  Medicare visit 70 g codes    OT Start Time  1102    OT Stop Time  1200    OT Time Calculation (min)  58 min    Activity Tolerance  Patient tolerated treatment well    Behavior During Therapy  [facility] for tasks assessed/performed       Past Medical History:  Diagnosis Date  . Anxiety   . Arthritis   . Asthma    childhood asthma  . Cancer ([place]) 7 years ago   lymphoma, [place] (recent)  . Chronic kidney disease   . Colon polyps   . GERD (gastroesophageal reflux disease)   . History of kidney stones   . Parkinson disease [facility])     Past Surgical History:  Procedure Laterality Date  . COLON SURGERY    . CYSTOSCOPY WITH LITHOLAPAXY N/A 11/14/2016   Procedure: CYSTOSCOPY WITH LITHOLAPAXY;  Surgeon: Hollice Espy, MD;  Location: [facility] ORS;  Service: Urology;  Laterality: N/A;  . EYE SURGERY Bilateral    Cataract Extraction with IOL  . HERNIA REPAIR  20 years  . MOHS SURGERY    . SMALL INTESTINE SURGERY     Per patient 7 years  . TRANSURETHRAL RESECTION OF PROSTATE N/A 11/14/2016   Procedure: TRANSURETHRAL RESECTION OF THE PROSTATE (TURP);  Surgeon: Hollice Espy, MD;  Location: [facility] ORS;  Service: Urology;  Laterality: N/A;    There were no vitals filed for this visit.  Subjective Assessment - 09/16/17 1302    Subjective   Patient reports he was unable to come to therapy at the end of last week due to wife having surgery on her hand.  He has still been working on exercises.  Had a fall last night when  getting up to the bathroom, he reports he had his walker with him but just got tripped up.  Denies any injury.    Pertinent History  Patient reports he was diagnosed with Parkinson's disease about 10 plus years ago.  He reports a more recent decline in function in his daily activities in the last 6-12 months.  He has been seeing PT for the last couple months.    Patient Stated Goals  Patient reports he wants to be able to do more for himself and around the house.     Currently in Pain?  No/denies    Pain Score  0-No pain                   OT Treatments/Exercises (OP) - 09/16/17 1303      ADLs   ADL Comments  Patient seen for focus on navigating in small spaces such as the bathroom, distances from bedroom to bathroom and in the closet with the use of the rollator for multiple trials with cues for body positioning for turning, larger amplitude of steps.        Neurological Re-education Exercises   Other Exercises 1  Patient seen for instruction of LSVT BIG exercises: LSVT Daily  Session Maximal Daily Exercises: Sustained movements are designed to rescale the amplitude of movement output for generalization to daily functional activities. Performed as follows for 1 set of 10 repetitions each: Multi directional sustained movements- 1) Floor to ceiling, 2) Side to side. Multi directional Repetitive movements performed in standing and are designed to provide retraining effort needed for sustained muscle activation in tasks Performed as follows: 3) Step and reach forward, 4) Step and Reach Backwards, 5) Step and reach sideways, 6) Rock and reach forward/backward, 7) Rock and reach sideways. Exercises performed in standard version without the use of a chair with min assist to min guard from therapist and verbal cues.    Other Exercises 2  Stair negotiation for 5 steps up and down for 5 trials with supervision and cues. Reciprocal toe tapping with min guard and cues for amplitude of step to clear  height with foot after repeated trials             OT Education - 09/16/17 1305    Education provided  Yes    Education Details  turning with rollator, navigating in small spaces    Person(s) Educated  Patient    Methods  Explanation;Demonstration          OT Long Term Goals - 09/16/17 1308      OT [place] #1   Title  Patient will demonstrate increase right hand grip to be able to cut food with modified independence.     Baseline  occasional assist required.    Time  12    Period  Weeks    Status  Partially Met      OT LONG TERM GOAL #2   Title  Patient will complete donning long sleeve shirt with modified independence.    Baseline  requires assist with long sleeves, can perform short sleeves.    Time  12    Period  Weeks    Status  Achieved      OT LONG TERM GOAL #3   Title  Patient will improve coordination to manage buttons on clothing with occasional assistance only.    Baseline  requires assist with buttons each time    Time  12    Period  Weeks    Status  Partially Met      OT LONG TERM GOAL #4   Title  Patient will complete managing pants after toileting with modified independence.     Baseline  assist most days     Time  12    Period  Weeks    Status  Achieved      OT LONG TERM GOAL #5   Title  Patient will demonstrate turning behaviors with rollator using large steps and keeping feet behind the rollator with all turns with occasional minimal cues.     Time  12    Period  Weeks    Status  New      OT LONG TERM GOAL #6   Title  Patient will improve gait speed and endurance and be able to walk 1050 feet in 6 minutes to negotiate around the home and community safely in 4 weeks    Baseline  2542 at recert 7/0/62    Time  4    Period  Weeks    Status  Revised      OT LONG TERM GOAL #7   Title  Patient will complete HEP for maximal daily exercises with modified independence in 4 weeks  Time  12    Period  Weeks    Status  Partially Met       OT LONG TERM GOAL #8   Title  Patient will transfer from sit to stand without the use of arms safely and independently from a variety of chairs/surfaces in 4 weeks.     Baseline  difficulty from lower surfaces    Time  4    Period  Weeks    Status  Achieved      OT LONG TERM GOAL  #9   Baseline  Patient will demonstrate decreased episodes of freezing of behaviors from score of 16 to score of less than 12.    Time  4    Period  Weeks    Status  Achieved            Plan - 09/16/17 1306    Clinical Impression Statement  Patient seen for reassessment of goals this date, he continues to progress in all areas, focused on balance and moving towards being able to perform exercises in a standard version versus adapted version.  He still has to use adapted version at home and will continue with development of standard version with assist in the clinic.  He continues to benefit from skilled OT services to increase his safety and independence in daily tasks.  He has had one fall but was using his walker at the time, continues to demonstrate some mild freezing of gait especially at night.  Continue to work towards goals.     Rehab Potential  Good    Current Impairments/barriers affecting progress:  positive: family support, negative: level of motivation, progressive disease    OT Frequency  2x / week    OT Duration  12 weeks    OT Treatment/Interventions  Self-care/ADL training;Moist Heat;DME and/or AE instruction;Patient/family education;Therapeutic exercises;Balance training;Therapeutic exercise;Therapeutic activities;Neuromuscular education;Functional Mobility Training;Manual Therapy    Consulted and Agree with Plan of Care  Patient       Patient will benefit from skilled therapeutic intervention in order to improve the following deficits and impairments:  Abnormal gait, Decreased coordination, Decreased range of motion, Difficulty walking, Decreased endurance, Decreased safety awareness,  Decreased activity tolerance, Decreased balance, Impaired UE functional use, Pain, Decreased mobility, Decreased strength  Visit Diagnosis: Difficulty in walking, not elsewhere classified  Muscle weakness (generalized)  Unsteadiness on feet  Other lack of coordination  Abnormal posture  Chronic right shoulder pain    Problem List Patient Active Problem List   Diagnosis Date Noted  . BPH with urinary obstruction 11/14/2016  . Urinary frequency 10/10/2016  . Microscopic hematuria 10/10/2016  . Parkinson's syndrome ([place]) 06/14/2016  . Arthritis 06/14/2016  . Depression 06/14/2016  . Heartburn 06/14/2016  . Urinary incontinence 06/14/2016  . Skin lesion 06/14/2016  . Dementia 06/14/2016  . Bilateral edema of lower extremity 06/14/2016   Achilles Dunk, OTR/L, CLT  Lovett,Amy 09/16/2017, 1:11 PM  [place] MAIN [facility] SERVICES [address] [place], [place], [address] Phone: [phone]   Fax:  [phone]  Name: Luke Durham MRN: [id number] Date of Birth: 11/05/1932

## 2017-09-19 ENCOUNTER — Ambulatory Visit: Payer: Medicare Other | Admitting: Occupational Therapy

## 2017-09-19 DIAGNOSIS — M6281 Muscle weakness (generalized): Secondary | ICD-10-CM

## 2017-09-19 DIAGNOSIS — R278 Other lack of coordination: Secondary | ICD-10-CM

## 2017-09-19 DIAGNOSIS — R262 Difficulty in walking, not elsewhere classified: Secondary | ICD-10-CM

## 2017-09-19 DIAGNOSIS — R2681 Unsteadiness on feet: Secondary | ICD-10-CM

## 2017-09-19 DIAGNOSIS — G8929 Other chronic pain: Secondary | ICD-10-CM

## 2017-09-19 DIAGNOSIS — M25511 Pain in right shoulder: Secondary | ICD-10-CM

## 2017-09-19 DIAGNOSIS — R293 Abnormal posture: Secondary | ICD-10-CM

## 2017-09-22 ENCOUNTER — Encounter: Payer: Self-pay | Admitting: Occupational Therapy

## 2017-09-22 ENCOUNTER — Telehealth: Payer: Self-pay

## 2017-09-22 ENCOUNTER — Ambulatory Visit: Payer: Medicare Other | Admitting: Occupational Therapy

## 2017-09-22 DIAGNOSIS — G8929 Other chronic pain: Secondary | ICD-10-CM

## 2017-09-22 DIAGNOSIS — R262 Difficulty in walking, not elsewhere classified: Secondary | ICD-10-CM | POA: Diagnosis not present

## 2017-09-22 DIAGNOSIS — R278 Other lack of coordination: Secondary | ICD-10-CM

## 2017-09-22 DIAGNOSIS — R2681 Unsteadiness on feet: Secondary | ICD-10-CM

## 2017-09-22 DIAGNOSIS — R293 Abnormal posture: Secondary | ICD-10-CM

## 2017-09-22 DIAGNOSIS — M25511 Pain in right shoulder: Secondary | ICD-10-CM

## 2017-09-22 DIAGNOSIS — M6281 Muscle weakness (generalized): Secondary | ICD-10-CM

## 2017-09-22 NOTE — Telephone Encounter (Signed)
Pt wife called wanting ucx results and stated that since pt gave sample he has had 1 single episode of gross hematuria. Made wife aware ucx was negative. Reinforced with wife for pt to increase fluid in take in reference to gross hematuria. Wife denied pt to have n/v, f/c, dysuria. Wife voiced understanding of whole conversation.

## 2017-09-22 NOTE — Therapy (Signed)
[place] MAIN [facility] SERVICES [address] [place], [place], [address] Phone: [phone]   Fax:  [phone]  Occupational Therapy Treatment  Patient Details  Name: Luke Durham MRN: [id number] Date of Birth: [birth year] Referring Provider: Marlyn Corporal   Encounter Date: 09/22/2017  OT End of Session - 09/22/17 1819    Visit Number  72    Number of Visits  95    Date for OT Re-Evaluation  11/10/17    Authorization Type  Medicare visit 72 g codes    OT Start Time  1100    OT Stop Time  1201    OT Time Calculation (min)  61 min    Activity Tolerance  Patient tolerated treatment well    Behavior During Therapy  [facility] for tasks assessed/performed       Past Medical History:  Diagnosis Date  . Anxiety   . Arthritis   . Asthma    childhood asthma  . Cancer ([place]) 7 years ago   lymphoma, [place] (recent)  . Chronic kidney disease   . Colon polyps   . GERD (gastroesophageal reflux disease)   . History of kidney stones   . Parkinson disease [facility])     Past Surgical History:  Procedure Laterality Date  . COLON SURGERY    . CYSTOSCOPY WITH LITHOLAPAXY N/A 11/14/2016   Procedure: CYSTOSCOPY WITH LITHOLAPAXY;  Surgeon: Hollice Espy, MD;  Location: [facility] ORS;  Service: Urology;  Laterality: N/A;  . EYE SURGERY Bilateral    Cataract Extraction with IOL  . HERNIA REPAIR  20 years  . MOHS SURGERY    . SMALL INTESTINE SURGERY     Per patient 7 years  . TRANSURETHRAL RESECTION OF PROSTATE N/A 11/14/2016   Procedure: TRANSURETHRAL RESECTION OF THE PROSTATE (TURP);  Surgeon: Hollice Espy, MD;  Location: [facility] ORS;  Service: Urology;  Laterality: N/A;    There were no vitals filed for this visit.  Subjective Assessment - 09/22/17 1817    Subjective   Patient reports his neice left to go home.  His daughter will be away this weekend visiting with her kids.      Patient Stated Goals  Patient reports he wants to be able to do more for himself and  around the house.     Currently in Pain?  Yes    Pain Score  7     Pain Location  Shoulder    Pain Orientation  Right    Pain Descriptors / Indicators  Aching    Pain Type  Chronic pain    Multiple Pain Sites  No                   OT Treatments/Exercises (OP) - 09/22/17 1820      ADLs   ADL Comments  Patient seen for stair negotiation with reciprocal stepping patterns with min guard and cues.  Reciprocal tapping exercise with alternating LEs with cues for technique and therapist demo.  Posture exercises in sitting, standing and on the wall for stretches, therapist using manual skills for pectoralis stretch.       Neurological Re-education Exercises   Other Exercises 1  Patient seen for instruction of LSVT BIG exercises: LSVT Daily Session Maximal Daily Exercises: Sustained movements are designed to rescale the amplitude of movement output for generalization to daily functional activities. Performed as follows for 1 set of 10 repetitions each: Multi directional sustained movements- 1) Floor to ceiling, 2) Side to side. Multi directional  Repetitive movements performed in standing and are designed to provide retraining effort needed for sustained muscle activation in tasks Performed as follows: 3) Step and reach forward, 4) Step and Reach Backwards, 5) Step and reach sideways, 6) Rock and reach forward/backward, 7) Rock and reach sideways. Exercises performed in standard version without the use of a chair with min assist to min guard from therapist and verbal cues.    Other Exercises 2  Difficulty with using right arm in exercises due to pain at the beginning of session, moist heat applied for 5 minutes prior to exercises to decrease pain and increase motion.  Functional mobility skills in crowded areas with cues for turning.  Occasional freezing this date but able to recover if walker is pulled back slightly and verbal cues to stay close.              OT Education - 09/22/17  1818    Education provided  Yes    Education Details  posture, rollator use    Person(s) Educated  Patient    Methods  Explanation;Demonstration;Verbal cues    Comprehension  Verbalized understanding;Returned demonstration;Verbal cues required          OT Long Term Goals - 09/16/17 1308      OT LONG TERM GOAL #1   Title  Patient will demonstrate increase right hand grip to be able to cut food with modified independence.     Baseline  occasional assist required.    Time  12    Period  Weeks    Status  Partially Met      OT LONG TERM GOAL #2   Title  Patient will complete donning long sleeve shirt with modified independence.    Baseline  requires assist with long sleeves, can perform short sleeves.    Time  12    Period  Weeks    Status  Achieved      OT LONG TERM GOAL #3   Title  Patient will improve coordination to manage buttons on clothing with occasional assistance only.    Baseline  requires assist with buttons each time    Time  12    Period  Weeks    Status  Partially Met      OT LONG TERM GOAL #4   Title  Patient will complete managing pants after toileting with modified independence.     Baseline  assist most days     Time  12    Period  Weeks    Status  Achieved      OT LONG TERM GOAL #5   Title  Patient will demonstrate turning behaviors with rollator using large steps and keeping feet behind the rollator with all turns with occasional minimal cues.     Time  12    Period  Weeks    Status  New      OT LONG TERM GOAL #6   Title  Patient will improve gait speed and endurance and be able to walk 1050 feet in 6 minutes to negotiate around the home and community safely in 4 weeks    Baseline  5573 at recert 11/25/00    Time  4    Period  Weeks    Status  Revised      OT LONG TERM GOAL #7   Title  Patient will complete HEP for maximal daily exercises with modified independence in 4 weeks    Time  12    Period  Weeks    Status  Partially Met      OT LONG  TERM GOAL #8   Title  Patient will transfer from sit to stand without the use of arms safely and independently from a variety of chairs/surfaces in 4 weeks.     Baseline  difficulty from lower surfaces    Time  4    Period  Weeks    Status  Achieved      OT LONG TERM GOAL  #9   Baseline  Patient will demonstrate decreased episodes of freezing of behaviors from score of 16 to score of less than 12.    Time  4    Period  Weeks    Status  Achieved            Plan - 09/22/17 1819    Clinical Impression Statement  Patient continues to work towards improving functional mobility and larger amplitude of gait patterns to help reduce fall risk. Patient tends to lean forwards with walking and requires cues for posture correction to help decrease forwards lean which affects balance.  Continuing to work towards navigating in crowded areas with some evidence of mild freezing behaviors.      Occupational performance deficits (Please refer to evaluation for details):  ADL's;IADL's;Leisure    Rehab Potential  Good    Current Impairments/barriers affecting progress:  positive: family support, negative: level of motivation, progressive disease    OT Frequency  2x / week    OT Duration  12 weeks    OT Treatment/Interventions  Self-care/ADL training;Moist Heat;DME and/or AE instruction;Balance training;Therapeutic activities;Therapeutic exercise;Neuromuscular education;Functional Mobility Training;Patient/family education    Consulted and Agree with Plan of Care  Patient       Patient will benefit from skilled therapeutic intervention in order to improve the following deficits and impairments:  Abnormal gait, Decreased coordination, Decreased range of motion, Difficulty walking, Decreased endurance, Decreased safety awareness, Decreased activity tolerance, Decreased balance, Impaired UE functional use, Pain, Decreased mobility, Decreased strength  Visit Diagnosis: Difficulty in walking, not elsewhere  classified  Muscle weakness (generalized)  Unsteadiness on feet  Other lack of coordination  Abnormal posture  Chronic right shoulder pain    Problem List Patient Active Problem List   Diagnosis Date Noted  . BPH with urinary obstruction 11/14/2016  . Urinary frequency 10/10/2016  . Microscopic hematuria 10/10/2016  . Parkinson's syndrome ([place]) 06/14/2016  . Arthritis 06/14/2016  . Depression 06/14/2016  . Heartburn 06/14/2016  . Urinary incontinence 06/14/2016  . Skin lesion 06/14/2016  . Dementia 06/14/2016  . Bilateral edema of lower extremity 06/14/2016   Achilles Dunk, OTR/L, CLT  Lovett,Amy 09/22/2017, 6:26 PM  [place] MAIN [facility] SERVICES [address] [place], [place], [address] Phone: [phone]   Fax:  [phone]  Name: Luke Durham MRN: [id number] Date of Birth: 1933/02/06

## 2017-09-22 NOTE — Therapy (Signed)
[place] MAIN [facility] SERVICES [address] [place], [place], [address] Phone: [phone]   Fax:  [phone]  Occupational Therapy Treatment  Patient Details  Name: Luke Durham MRN: [id number] Date of Birth: October 27, 1932 Referring Provider: Marlyn Corporal   Encounter Date: 09/19/2017  OT End of Session - 09/22/17 5366    Visit Number  71    Number of Visits  95    Date for OT Re-Evaluation  11/10/17    Authorization Type  Medicare visit 71 g codes    OT Start Time  1100    OT Stop Time  1158    OT Time Calculation (min)  58 min    Activity Tolerance  Patient tolerated treatment well    Behavior During Therapy  [facility] for tasks assessed/performed       Past Medical History:  Diagnosis Date  . Anxiety   . Arthritis   . Asthma    childhood asthma  . Cancer ([place]) 7 years ago   lymphoma, [place] (recent)  . Chronic kidney disease   . Colon polyps   . GERD (gastroesophageal reflux disease)   . History of kidney stones   . Parkinson disease [facility])     Past Surgical History:  Procedure Laterality Date  . COLON SURGERY    . CYSTOSCOPY WITH LITHOLAPAXY N/A 11/14/2016   Procedure: CYSTOSCOPY WITH LITHOLAPAXY;  Surgeon: Hollice Espy, MD;  Location: [facility] ORS;  Service: Urology;  Laterality: N/A;  . EYE SURGERY Bilateral    Cataract Extraction with IOL  . HERNIA REPAIR  20 years  . MOHS SURGERY    . SMALL INTESTINE SURGERY     Per patient 7 years  . TRANSURETHRAL RESECTION OF PROSTATE N/A 11/14/2016   Procedure: TRANSURETHRAL RESECTION OF THE PROSTATE (TURP);  Surgeon: Hollice Espy, MD;  Location: [facility] ORS;  Service: Urology;  Laterality: N/A;    There were no vitals filed for this visit.  Subjective Assessment - 09/22/17 1714    Subjective   Patient reports his wife had surgery on her hand and will be going for hand therapy this week.  Patient doing well and denies any pain or falls.     Pertinent History  Patient reports he was diagnosed  with Parkinson's disease about 10 plus years ago.  He reports a more recent decline in function in his daily activities in the last 6-12 months.  He has been seeing PT for the last couple months.    Patient Stated Goals  Patient reports he wants to be able to do more for himself and around the house.     Currently in Pain?  No/denies    Pain Score  0-No pain    Multiple Pain Sites  No                   OT Treatments/Exercises (OP) - 09/22/17 1720      ADLs   ADL Comments  Patient seen for turning in small spaces with rollator with cues for body positioning in relation to walker.  Patient performed reciprocal stepping patterns for 10 repetitions on each side for balance and pattern for gait.       Neurological Re-education Exercises   Other Exercises 1  Patient seen for instruction of LSVT BIG exercises: LSVT Daily Session Maximal Daily Exercises: Sustained movements are designed to rescale the amplitude of movement output for generalization to daily functional activities. Performed as follows for 1 set of 10 repetitions each: Multi  directional sustained movements- 1) Floor to ceiling, 2) Side to side. Multi directional Repetitive movements performed in standing and are designed to provide retraining effort needed for sustained muscle activation in tasks Performed as follows: 3) Step and reach forward, 4) Step and Reach Backwards, 5) Step and reach sideways, 6) Rock and reach forward/backward, 7) Rock and reach sideways. Exercises performed in standard version without the use of a chair with min assist to min guard from therapist and verbal cues.    Other Exercises 2  Functional mobility in crowded space of cafeteria with rollator to manage movement around crowds and obstacles without hesistations or freezing of gait.              OT Education - 09/22/17 1716    Education provided  Yes    Education Details  turning in small spaces, rollator use, maximal daily exercises     Person(s) Educated  Patient    Methods  Explanation;Demonstration;Verbal cues    Comprehension  Verbalized understanding;Returned demonstration;Verbal cues required          OT Long Term Goals - 09/16/17 1308      OT LONG TERM GOAL #1   Title  Patient will demonstrate increase right hand grip to be able to cut food with modified independence.     Baseline  occasional assist required.    Time  12    Period  Weeks    Status  Partially Met      OT LONG TERM GOAL #2   Title  Patient will complete donning long sleeve shirt with modified independence.    Baseline  requires assist with long sleeves, can perform short sleeves.    Time  12    Period  Weeks    Status  Achieved      OT LONG TERM GOAL #3   Title  Patient will improve coordination to manage buttons on clothing with occasional assistance only.    Baseline  requires assist with buttons each time    Time  12    Period  Weeks    Status  Partially Met      OT LONG TERM GOAL #4   Title  Patient will complete managing pants after toileting with modified independence.     Baseline  assist most days     Time  12    Period  Weeks    Status  Achieved      OT LONG TERM GOAL #5   Title  Patient will demonstrate turning behaviors with rollator using large steps and keeping feet behind the rollator with all turns with occasional minimal cues.     Time  12    Period  Weeks    Status  New      OT LONG TERM GOAL #6   Title  Patient will improve gait speed and endurance and be able to walk 1050 feet in 6 minutes to negotiate around the home and community safely in 4 weeks    Baseline  1696 at recert 04/30/92    Time  4    Period  Weeks    Status  Revised      OT LONG TERM GOAL #7   Title  Patient will complete HEP for maximal daily exercises with modified independence in 4 weeks    Time  12    Period  Weeks    Status  Partially Met      OT LONG TERM GOAL #8   Title  Patient will transfer from sit to stand without the use of  arms safely and independently from a variety of chairs/surfaces in 4 weeks.     Baseline  difficulty from lower surfaces    Time  4    Period  Weeks    Status  Achieved      OT LONG TERM GOAL  #9   Baseline  Patient will demonstrate decreased episodes of freezing of behaviors from score of 16 to score of less than 12.    Time  4    Period  Weeks    Status  Achieved            Plan - 09/22/17 1717    Clinical Impression Statement  Patient presents with balance impairments which place him at risk for falls.  He continues to work towards exercises to help improve balance, turning behaviors and functional mobility skills.  He responds well to verbal and tactile cues and benefits from repetition for improved carryover at home and in the community.  Will continue to work towards these tasks as well as mobility with a new device (rollator)    Occupational performance deficits (Please refer to evaluation for details):  ADL's;IADL's;Leisure    Rehab Potential  Good    Current Impairments/barriers affecting progress:  positive: family support, negative: level of motivation, progressive disease    OT Frequency  2x / week    OT Duration  12 weeks    OT Treatment/Interventions  Self-care/ADL training;Moist Heat;DME and/or AE instruction;Balance training;Therapeutic activities;Therapeutic exercise;Neuromuscular education;Functional Mobility Training;Patient/family education    Consulted and Agree with Plan of Care  Patient       Patient will benefit from skilled therapeutic intervention in order to improve the following deficits and impairments:  Abnormal gait, Decreased coordination, Decreased range of motion, Difficulty walking, Decreased endurance, Decreased safety awareness, Decreased activity tolerance, Decreased balance, Impaired UE functional use, Pain, Decreased mobility, Decreased strength  Visit Diagnosis: Difficulty in walking, not elsewhere classified  Muscle weakness  (generalized)  Unsteadiness on feet  Other lack of coordination  Abnormal posture  Chronic right shoulder pain    Problem List Patient Active Problem List   Diagnosis Date Noted  . BPH with urinary obstruction 11/14/2016  . Urinary frequency 10/10/2016  . Microscopic hematuria 10/10/2016  . Parkinson's syndrome ([place]) 06/14/2016  . Arthritis 06/14/2016  . Depression 06/14/2016  . Heartburn 06/14/2016  . Urinary incontinence 06/14/2016  . Skin lesion 06/14/2016  . Dementia 06/14/2016  . Bilateral edema of lower extremity 06/14/2016   Achilles Dunk, OTR/L, CLT  Lovett,Amy 09/22/2017, 5:23 PM  [place] MAIN [facility] SERVICES [address] [place], [place], [address] Phone: [phone]   Fax:  [phone]  Name: Luke Durham MRN: [id number] Date of Birth: [birth year]

## 2017-09-26 ENCOUNTER — Ambulatory Visit: Payer: Medicare Other | Attending: Neurology | Admitting: Occupational Therapy

## 2017-09-26 DIAGNOSIS — R293 Abnormal posture: Secondary | ICD-10-CM | POA: Diagnosis present

## 2017-09-26 DIAGNOSIS — G8929 Other chronic pain: Secondary | ICD-10-CM | POA: Insufficient documentation

## 2017-09-26 DIAGNOSIS — R262 Difficulty in walking, not elsewhere classified: Secondary | ICD-10-CM | POA: Diagnosis present

## 2017-09-26 DIAGNOSIS — R278 Other lack of coordination: Secondary | ICD-10-CM | POA: Insufficient documentation

## 2017-09-26 DIAGNOSIS — R2681 Unsteadiness on feet: Secondary | ICD-10-CM | POA: Diagnosis present

## 2017-09-26 DIAGNOSIS — M25511 Pain in right shoulder: Secondary | ICD-10-CM | POA: Diagnosis present

## 2017-09-26 DIAGNOSIS — M6281 Muscle weakness (generalized): Secondary | ICD-10-CM | POA: Insufficient documentation

## 2017-09-29 ENCOUNTER — Ambulatory Visit: Payer: Medicare Other | Admitting: Occupational Therapy

## 2017-09-29 DIAGNOSIS — R262 Difficulty in walking, not elsewhere classified: Secondary | ICD-10-CM | POA: Diagnosis not present

## 2017-09-29 DIAGNOSIS — R2681 Unsteadiness on feet: Secondary | ICD-10-CM

## 2017-09-29 DIAGNOSIS — R293 Abnormal posture: Secondary | ICD-10-CM

## 2017-09-29 DIAGNOSIS — G8929 Other chronic pain: Secondary | ICD-10-CM

## 2017-09-29 DIAGNOSIS — R278 Other lack of coordination: Secondary | ICD-10-CM

## 2017-09-29 DIAGNOSIS — M25511 Pain in right shoulder: Secondary | ICD-10-CM

## 2017-09-29 DIAGNOSIS — M6281 Muscle weakness (generalized): Secondary | ICD-10-CM

## 2017-09-30 ENCOUNTER — Encounter: Payer: Self-pay | Admitting: Occupational Therapy

## 2017-09-30 NOTE — Therapy (Signed)
[place] MAIN [facility] SERVICES [address] [place], [place], [address] Phone: [phone]   Fax:  [phone]  Occupational Therapy Treatment  Patient Details  Name: Luke Durham MRN: [id number] Date of Birth: 1933/06/18 Referring Provider (Historical): [place]   Encounter Date: 09/29/2017  OT End of Session - 09/30/17 1938    Visit Number  74    Number of Visits  95    Date for OT Re-Evaluation  11/10/17    Authorization Type  Medicare visit 74 g codes    OT Start Time  1100    OT Stop Time  1200    OT Time Calculation (min)  60 min    Activity Tolerance  Patient tolerated treatment well    Behavior During Therapy  [facility] for tasks assessed/performed       Past Medical History:  Diagnosis Date  . Anxiety   . Arthritis   . Asthma    childhood asthma  . Cancer ([place]) 7 years ago   lymphoma, [place] (recent)  . Chronic kidney disease   . Colon polyps   . GERD (gastroesophageal reflux disease)   . History of kidney stones   . Parkinson disease [facility])     Past Surgical History:  Procedure Laterality Date  . COLON SURGERY    . CYSTOSCOPY WITH LITHOLAPAXY N/A 11/14/2016   Procedure: CYSTOSCOPY WITH LITHOLAPAXY;  Surgeon: Hollice Espy, MD;  Location: [facility] ORS;  Service: Urology;  Laterality: N/A;  . EYE SURGERY Bilateral    Cataract Extraction with IOL  . HERNIA REPAIR  20 years  . MOHS SURGERY    . SMALL INTESTINE SURGERY     Per patient 7 years  . TRANSURETHRAL RESECTION OF PROSTATE N/A 11/14/2016   Procedure: TRANSURETHRAL RESECTION OF THE PROSTATE (TURP);  Surgeon: Hollice Espy, MD;  Location: [facility] ORS;  Service: Urology;  Laterality: N/A;    There were no vitals filed for this visit.  Subjective Assessment - 09/30/17 1936    Subjective   Patient states, "If the weather is bad on Monday, I won't be here.    Pertinent History  Patient reports he was diagnosed with Parkinson's disease about 10 plus years ago.  He reports a  more recent decline in function in his daily activities in the last 6-12 months.  He has been seeing PT for the last couple months.    Patient Stated Goals  Patient reports he wants to be able to do more for himself and around the house.     Currently in Pain?  No/denies    Pain Score  0-No pain                   OT Treatments/Exercises (OP) - 09/30/17 1927      ADLs   ADL Comments  Patient seen for stair negotiation with reciprocal stepping patterns with min guard and cues.  Reciprocal tapping exercise with alternating LEs with cues for technique and therapist demo.  Posture exercises in sitting, standing and on the wall for stretches, therapist using manual skills for pectoralis stretch.       Neurological Re-education Exercises   Other Exercises 1  Patient seen for instruction of LSVT BIG exercises: LSVT Daily Session Maximal Daily Exercises: Sustained movements are designed to rescale the amplitude of movement output for generalization to daily functional activities. Performed as follows for 1 set of 10 repetitions each: Multi directional sustained movements- 1) Floor to ceiling, 2) Side to side. Multi  directional Repetitive movements performed in standing and are designed to provide retraining effort needed for sustained muscle activation in tasks Performed as follows: 3) Step and reach forward, 4) Step and Reach Backwards, 5) Step and reach sideways, 6) Rock and reach forward/backward, 7) Rock and reach sideways. Exercises performed in standard version without the use of a chair with min assist to min guard from therapist and verbal cues.    Other Exercises 2  Functional mobility with rollator             OT Education - 09/30/17 1937    Education provided  Yes    Education Details  turns, rollator use, balance    Person(s) Educated  Patient    Methods  Explanation;Demonstration;Verbal cues    Comprehension  Verbalized understanding;Returned demonstration;Verbal cues  required          OT Long Term Goals - 09/16/17 1308      OT LONG TERM GOAL #1   Title  Patient will demonstrate increase right hand grip to be able to cut food with modified independence.     Baseline  occasional assist required.    Time  12    Period  Weeks    Status  Partially Met      OT LONG TERM GOAL #2   Title  Patient will complete donning long sleeve shirt with modified independence.    Baseline  requires assist with long sleeves, can perform short sleeves.    Time  12    Period  Weeks    Status  Achieved      OT LONG TERM GOAL #3   Title  Patient will improve coordination to manage buttons on clothing with occasional assistance only.    Baseline  requires assist with buttons each time    Time  12    Period  Weeks    Status  Partially Met      OT LONG TERM GOAL #4   Title  Patient will complete managing pants after toileting with modified independence.     Baseline  assist most days     Time  12    Period  Weeks    Status  Achieved      OT LONG TERM GOAL #5   Title  Patient will demonstrate turning behaviors with rollator using large steps and keeping feet behind the rollator with all turns with occasional minimal cues.     Time  12    Period  Weeks    Status  New      OT LONG TERM GOAL #6   Title  Patient will improve gait speed and endurance and be able to walk 1050 feet in 6 minutes to negotiate around the home and community safely in 4 weeks    Baseline  3536 at recert 10/27/41    Time  4    Period  Weeks    Status  Revised      OT LONG TERM GOAL #7   Title  Patient will complete HEP for maximal daily exercises with modified independence in 4 weeks    Time  12    Period  Weeks    Status  Partially Met      OT LONG TERM GOAL #8   Title  Patient will transfer from sit to stand without the use of arms safely and independently from a variety of chairs/surfaces in 4 weeks.     Baseline  difficulty from lower surfaces  Time  4    Period  Weeks     Status  Achieved      OT LONG TERM GOAL  #9   Baseline  Patient will demonstrate decreased episodes of freezing of behaviors from score of 16 to score of less than 12.    Time  4    Period  Weeks    Status  Achieved            Plan - 09/30/17 1938    Clinical Impression Statement  Patient  able to alternate sides when performing sideways stepping exercise today which is an increase in complexity of the exercise.  Patient continues to progress well and working towards balance, functional transfers and mobility to reduce fall risk.     Occupational Profile and client history currently impacting functional performance  progressive disease process, freezing of gait behaviors, fall risk    Occupational performance deficits (Please refer to evaluation for details):  ADL's;IADL's;Leisure    Rehab Potential  Good    Current Impairments/barriers affecting progress:  positive: family support, negative: level of motivation, progressive disease    OT Frequency  2x / week    OT Duration  12 weeks    OT Treatment/Interventions  Self-care/ADL training;Moist Heat;DME and/or AE instruction;Balance training;Therapeutic activities;Therapeutic exercise;Neuromuscular education;Functional Mobility Training;Patient/family education    Consulted and Agree with Plan of Care  Patient       Patient will benefit from skilled therapeutic intervention in order to improve the following deficits and impairments:  Abnormal gait, Decreased coordination, Decreased range of motion, Difficulty walking, Decreased endurance, Decreased safety awareness, Decreased activity tolerance, Decreased balance, Impaired UE functional use, Pain, Decreased mobility, Decreased strength  Visit Diagnosis: Difficulty in walking, not elsewhere classified  Muscle weakness (generalized)  Unsteadiness on feet  Other lack of coordination  Abnormal posture  Chronic right shoulder pain    Problem List Patient Active Problem List    Diagnosis Date Noted  . BPH with urinary obstruction 11/14/2016  . Urinary frequency 10/10/2016  . Microscopic hematuria 10/10/2016  . Parkinson's syndrome ([place]) 06/14/2016  . Arthritis 06/14/2016  . Depression 06/14/2016  . Heartburn 06/14/2016  . Urinary incontinence 06/14/2016  . Skin lesion 06/14/2016  . Dementia 06/14/2016  . Bilateral edema of lower extremity 06/14/2016   Achilles Dunk, OTR/L, CLT  Lovett,Amy 09/30/2017, 7:40 PM  [place] MAIN [facility] SERVICES [address] [place], [place], [address] Phone: [phone]   Fax:  [phone]  Name: Luke Durham MRN: [id number] Date of Birth: [birth year]

## 2017-09-30 NOTE — Therapy (Signed)
[place] MAIN [facility] SERVICES [address] [place], [place], [address] Phone: [phone]   Fax:  [phone]  Occupational Therapy Treatment  Patient Details  Name: Luke Durham MRN: [id number] Date of Birth: [birth year] Referring Provider (Historical): Burnette   Encounter Date: 09/26/2017  OT End of Session - 09/30/17 1928    Visit Number  73    Number of Visits  95    Date for OT Re-Evaluation  11/10/17    Authorization Type  Medicare visit 73 g codes    OT Start Time  1101    OT Stop Time  1200    OT Time Calculation (min)  59 min    Activity Tolerance  Patient tolerated treatment well    Behavior During Therapy  [facility] for tasks assessed/performed       Past Medical History:  Diagnosis Date  . Anxiety   . Arthritis   . Asthma    childhood asthma  . Cancer ([place]) 7 years ago   lymphoma, [place] (recent)  . Chronic kidney disease   . Colon polyps   . GERD (gastroesophageal reflux disease)   . History of kidney stones   . Parkinson disease [facility])     Past Surgical History:  Procedure Laterality Date  . COLON SURGERY    . CYSTOSCOPY WITH LITHOLAPAXY N/A 11/14/2016   Procedure: CYSTOSCOPY WITH LITHOLAPAXY;  Surgeon: Hollice Espy, MD;  Location: [facility] ORS;  Service: Urology;  Laterality: N/A;  . EYE SURGERY Bilateral    Cataract Extraction with IOL  . HERNIA REPAIR  20 years  . MOHS SURGERY    . SMALL INTESTINE SURGERY     Per patient 7 years  . TRANSURETHRAL RESECTION OF PROSTATE N/A 11/14/2016   Procedure: TRANSURETHRAL RESECTION OF THE PROSTATE (TURP);  Surgeon: Hollice Espy, MD;  Location: [facility] ORS;  Service: Urology;  Laterality: N/A;    There were no vitals filed for this visit.  Subjective Assessment - 09/30/17 1926    Subjective   Patient reports no changes, felt a little weaker today with stepping patterns but not sure why.  "I am tired of being sick"    Pertinent History  Patient reports he was diagnosed with  Parkinson's disease about 10 plus years ago.  He reports a more recent decline in function in his daily activities in the last 6-12 months.  He has been seeing PT for the last couple months.    Patient Stated Goals  Patient reports he wants to be able to do more for himself and around the house.     Currently in Pain?  Yes    Pain Score  2     Pain Location  Arm    Pain Orientation  Right    Pain Descriptors / Indicators  Aching    Pain Type  Chronic pain    Pain Onset  More than a month ago    Multiple Pain Sites  No                   OT Treatments/Exercises (OP) - 09/30/17 1927      ADLs   ADL Comments  Patient seen for stair negotiation with reciprocal stepping patterns with min guard and cues.  Reciprocal tapping exercise with alternating LEs with cues for technique and therapist demo.  Posture exercises in sitting, standing and on the wall for stretches, therapist using manual skills for pectoralis stretch.       Neurological Re-education Exercises  Other Exercises 1  Patient seen for instruction of LSVT BIG exercises: LSVT Daily Session Maximal Daily Exercises: Sustained movements are designed to rescale the amplitude of movement output for generalization to daily functional activities. Performed as follows for 1 set of 10 repetitions each: Multi directional sustained movements- 1) Floor to ceiling, 2) Side to side. Multi directional Repetitive movements performed in standing and are designed to provide retraining effort needed for sustained muscle activation in tasks Performed as follows: 3) Step and reach forward, 4) Step and Reach Backwards, 5) Step and reach sideways, 6) Rock and reach forward/backward, 7) Rock and reach sideways. Exercises performed in standard version without the use of a chair with min assist to min guard from therapist and verbal cues.    Other Exercises 2  Functional mobility with rollator             OT Education - 09/30/17 1928     Education provided  Yes    Education Details  stepping patterns when feeling weak, use of chair as needed.     Person(s) Educated  Patient    Methods  Explanation;Demonstration;Verbal cues    Comprehension  Verbalized understanding;Returned demonstration;Verbal cues required          OT Long Term Goals - 09/16/17 1308      OT LONG TERM GOAL #1   Title  Patient will demonstrate increase right hand grip to be able to cut food with modified independence.     Baseline  occasional assist required.    Time  12    Period  Weeks    Status  Partially Met      OT LONG TERM GOAL #2   Title  Patient will complete donning long sleeve shirt with modified independence.    Baseline  requires assist with long sleeves, can perform short sleeves.    Time  12    Period  Weeks    Status  Achieved      OT LONG TERM GOAL #3   Title  Patient will improve coordination to manage buttons on clothing with occasional assistance only.    Baseline  requires assist with buttons each time    Time  12    Period  Weeks    Status  Partially Met      OT LONG TERM GOAL #4   Title  Patient will complete managing pants after toileting with modified independence.     Baseline  assist most days     Time  12    Period  Weeks    Status  Achieved      OT LONG TERM GOAL #5   Title  Patient will demonstrate turning behaviors with rollator using large steps and keeping feet behind the rollator with all turns with occasional minimal cues.     Time  12    Period  Weeks    Status  New      OT LONG TERM GOAL #6   Title  Patient will improve gait speed and endurance and be able to walk 1050 feet in 6 minutes to negotiate around the home and community safely in 4 weeks    Baseline  5681 at recert 11/30/49    Time  4    Period  Weeks    Status  Revised      OT LONG TERM GOAL #7   Title  Patient will complete HEP for maximal daily exercises with modified independence in 4 weeks  Time  12    Period  Weeks    Status   Partially Met      OT LONG TERM GOAL #8   Title  Patient will transfer from sit to stand without the use of arms safely and independently from a variety of chairs/surfaces in 4 weeks.     Baseline  difficulty from lower surfaces    Time  4    Period  Weeks    Status  Achieved      OT LONG TERM GOAL  #9   Baseline  Patient will demonstrate decreased episodes of freezing of behaviors from score of 16 to score of less than 12.    Time  4    Period  Weeks    Status  Achieved            Plan - 09/30/17 1929    Clinical Impression Statement  Patient feeling weak this date and had some hesitations and freezing during exercises for brief periods.  When this happens, patient tends to panic and has difficulty with standing.  Therapist supported patient until he was able to get his balance and then transitioned to marching to get feet moving again.  Decreased pain in right shoulder this date versus last week.    Occupational Profile and client history currently impacting functional performance  progressive disease process, freezing of gait behaviors, fall risk    Occupational performance deficits (Please refer to evaluation for details):  ADL's;IADL's;Leisure    Rehab Potential  Good    Current Impairments/barriers affecting progress:  positive: family support, negative: level of motivation, progressive disease    OT Frequency  2x / week    OT Duration  12 weeks    OT Treatment/Interventions  Self-care/ADL training;Moist Heat;DME and/or AE instruction;Balance training;Therapeutic activities;Therapeutic exercise;Neuromuscular education;Functional Mobility Training;Patient/family education    Consulted and Agree with Plan of Care  Patient       Patient will benefit from skilled therapeutic intervention in order to improve the following deficits and impairments:  Abnormal gait, Decreased coordination, Decreased range of motion, Difficulty walking, Decreased endurance, Decreased safety  awareness, Decreased activity tolerance, Decreased balance, Impaired UE functional use, Pain, Decreased mobility, Decreased strength  Visit Diagnosis: Difficulty in walking, not elsewhere classified  Muscle weakness (generalized)  Unsteadiness on feet  Other lack of coordination  Abnormal posture  Chronic right shoulder pain    Problem List Patient Active Problem List   Diagnosis Date Noted  . BPH with urinary obstruction 11/14/2016  . Urinary frequency 10/10/2016  . Microscopic hematuria 10/10/2016  . Parkinson's syndrome ([place]) 06/14/2016  . Arthritis 06/14/2016  . Depression 06/14/2016  . Heartburn 06/14/2016  . Urinary incontinence 06/14/2016  . Skin lesion 06/14/2016  . Dementia 06/14/2016  . Bilateral edema of lower extremity 06/14/2016   Achilles Dunk, OTR/L, CLT  Nora Rooke 09/30/2017, 7:32 PM  [place] MAIN [facility] SERVICES [address] [place], [place], [address] Phone: [phone]   Fax:  [phone]  Name: Brayden Brodhead MRN: [id number] Date of Birth: [birth year]

## 2017-10-03 ENCOUNTER — Encounter: Payer: Medicare Other | Admitting: Occupational Therapy

## 2017-10-04 ENCOUNTER — Ambulatory Visit: Payer: Medicare Other | Admitting: Occupational Therapy

## 2017-10-04 DIAGNOSIS — R262 Difficulty in walking, not elsewhere classified: Secondary | ICD-10-CM

## 2017-10-04 DIAGNOSIS — R278 Other lack of coordination: Secondary | ICD-10-CM

## 2017-10-04 DIAGNOSIS — R293 Abnormal posture: Secondary | ICD-10-CM

## 2017-10-04 DIAGNOSIS — M6281 Muscle weakness (generalized): Secondary | ICD-10-CM

## 2017-10-04 DIAGNOSIS — R2681 Unsteadiness on feet: Secondary | ICD-10-CM

## 2017-10-06 ENCOUNTER — Encounter: Payer: Self-pay | Admitting: Occupational Therapy

## 2017-10-06 ENCOUNTER — Ambulatory Visit: Payer: Medicare Other | Admitting: Occupational Therapy

## 2017-10-06 DIAGNOSIS — M6281 Muscle weakness (generalized): Secondary | ICD-10-CM

## 2017-10-06 DIAGNOSIS — R293 Abnormal posture: Secondary | ICD-10-CM

## 2017-10-06 DIAGNOSIS — R2681 Unsteadiness on feet: Secondary | ICD-10-CM

## 2017-10-06 DIAGNOSIS — R278 Other lack of coordination: Secondary | ICD-10-CM

## 2017-10-06 DIAGNOSIS — R262 Difficulty in walking, not elsewhere classified: Secondary | ICD-10-CM | POA: Diagnosis not present

## 2017-10-06 NOTE — Therapy (Signed)
[place] MAIN [facility] SERVICES [address] [place], [place], [address] Phone: [phone]   Fax:  [phone]  Occupational Therapy Treatment  Patient Details  Name: Luke Durham MRN: [id number] Date of Birth: 26-Apr-1933 Referring Provider (Historical): Burnette   Encounter Date: 10/04/2017  OT End of Session - 10/06/17 1511    Visit Number  29    Number of Visits  95    Date for OT Re-Evaluation  11/10/17    Authorization Type  Medicare visit 75 g codes    OT Start Time  1100    OT Stop Time  1200    OT Time Calculation (min)  60 min    Activity Tolerance  Patient tolerated treatment well    Behavior During Therapy  [facility] for tasks assessed/performed       Past Medical History:  Diagnosis Date  . Anxiety   . Arthritis   . Asthma    childhood asthma  . Cancer ([place]) 7 years ago   lymphoma, [place] (recent)  . Chronic kidney disease   . Colon polyps   . GERD (gastroesophageal reflux disease)   . History of kidney stones   . Parkinson disease [facility])     Past Surgical History:  Procedure Laterality Date  . COLON SURGERY    . CYSTOSCOPY WITH LITHOLAPAXY N/A 11/14/2016   Procedure: CYSTOSCOPY WITH LITHOLAPAXY;  Surgeon: Hollice Espy, MD;  Location: [facility] ORS;  Service: Urology;  Laterality: N/A;  . EYE SURGERY Bilateral    Cataract Extraction with IOL  . HERNIA REPAIR  20 years  . MOHS SURGERY    . SMALL INTESTINE SURGERY     Per patient 7 years  . TRANSURETHRAL RESECTION OF PROSTATE N/A 11/14/2016   Procedure: TRANSURETHRAL RESECTION OF THE PROSTATE (TURP);  Surgeon: Hollice Espy, MD;  Location: [facility] ORS;  Service: Urology;  Laterality: N/A;    There were no vitals filed for this visit.  Subjective Assessment - 10/06/17 1506    Subjective   Patient reports his wife has to go to the dentist tomorrow.  They did not have any trouble getting here after the snow.     Pertinent History  Patient reports he was diagnosed with  Parkinson's disease about 10 plus years ago.  He reports a more recent decline in function in his daily activities in the last 6-12 months.  He has been seeing PT for the last couple months.    Patient Stated Goals  Patient reports he wants to be able to do more for himself and around the house.     Currently in Pain?  No/denies    Pain Score  0-No pain                   OT Treatments/Exercises (OP) - 10/06/17 1507      ADLs   ADL Comments  Patient seen for shoe tying activity with multiple repetitions of crossed leg method for tying shoes in sitting.  Seen for turns in small spaces to simulate bathroom mobility, cues for walker placement as well as body position in relation to rollator while turning.       Neurological Re-education Exercises   Other Exercises 1  Patient seen for instruction of LSVT BIG exercises: LSVT Daily Session Maximal Daily Exercises: Sustained movements are designed to rescale the amplitude of movement output for generalization to daily functional activities. Performed as follows for 1 set of 10 repetitions each: Multi directional sustained movements- 1)  Floor to ceiling, 2) Side to side. Multi directional Repetitive movements performed in standing and are designed to provide retraining effort needed for sustained muscle activation in tasks Performed as follows: 3) Step and reach forward, 4) Step and Reach Backwards, 5) Step and reach sideways, 6) Rock and reach forward/backward, 7) Rock and reach sideways. Exercises performed in standard version without the use of a chair with min assist to min guard from therapist and verbal cues.    Other Exercises 2  Functional mobility with rollator for 700 feet with cues for turns and for amplitude of steps at times.              OT Education - 10/06/17 1510    Education provided  Yes    Education Details  turning with rollator, crossed leg method for shoe tying    Methods  Explanation;Demonstration;Verbal cues     Comprehension  Verbalized understanding;Returned demonstration;Verbal cues required          OT Long Term Goals - 09/16/17 1308      OT LONG TERM GOAL #1   Title  Patient will demonstrate increase right hand grip to be able to cut food with modified independence.     Baseline  occasional assist required.    Time  12    Period  Weeks    Status  Partially Met      OT LONG TERM GOAL #2   Title  Patient will complete donning long sleeve shirt with modified independence.    Baseline  requires assist with long sleeves, can perform short sleeves.    Time  12    Period  Weeks    Status  Achieved      OT LONG TERM GOAL #3   Title  Patient will improve coordination to manage buttons on clothing with occasional assistance only.    Baseline  requires assist with buttons each time    Time  12    Period  Weeks    Status  Partially Met      OT LONG TERM GOAL #4   Title  Patient will complete managing pants after toileting with modified independence.     Baseline  assist most days     Time  12    Period  Weeks    Status  Achieved      OT LONG TERM GOAL #5   Title  Patient will demonstrate turning behaviors with rollator using large steps and keeping feet behind the rollator with all turns with occasional minimal cues.     Time  12    Period  Weeks    Status  New      OT LONG TERM GOAL #6   Title  Patient will improve gait speed and endurance and be able to walk 1050 feet in 6 minutes to negotiate around the home and community safely in 4 weeks    Baseline  7371 at recert 0/6/26    Time  4    Period  Weeks    Status  Revised      OT LONG TERM GOAL #7   Title  Patient will complete HEP for maximal daily exercises with modified independence in 4 weeks    Time  12    Period  Weeks    Status  Partially Met      OT LONG TERM GOAL #8   Title  Patient will transfer from sit to stand without the use of arms safely and  independently from a variety of chairs/surfaces in 4 weeks.      Baseline  difficulty from lower surfaces    Time  4    Period  Weeks    Status  Achieved      OT LONG TERM GOAL  #9   Baseline  Patient will demonstrate decreased episodes of freezing of behaviors from score of 16 to score of less than 12.    Time  4    Period  Weeks    Status  Achieved            Plan - 10/06/17 1511    Clinical Impression Statement  Patient continues to work towards improving balance and performance with maximal daily exercises in a standard version with therapist assist.  Patient continues to demonstrate difficulty with positioning of self for turns with rollator but responds well to most cues. Patient continues to demonstrate short shuffling steps with turning as well as occasional hesitations especially in crowded areas.  He would like to continue to work towards turning in the next few sessions.     Occupational Profile and client history currently impacting functional performance  progressive disease process, freezing of gait behaviors, fall risk    Occupational performance deficits (Please refer to evaluation for details):  ADL's;IADL's;Leisure    Rehab Potential  Good    Current Impairments/barriers affecting progress:  positive: family support, negative: level of motivation, progressive disease       Patient will benefit from skilled therapeutic intervention in order to improve the following deficits and impairments:  Abnormal gait, Decreased coordination, Decreased range of motion, Difficulty walking, Decreased endurance, Decreased safety awareness, Decreased activity tolerance, Decreased balance, Impaired UE functional use, Pain, Decreased mobility, Decreased strength  Visit Diagnosis: Difficulty in walking, not elsewhere classified  Muscle weakness (generalized)  Unsteadiness on feet  Other lack of coordination  Abnormal posture    Problem List Patient Active Problem List   Diagnosis Date Noted  . BPH with urinary obstruction 11/14/2016  .  Urinary frequency 10/10/2016  . Microscopic hematuria 10/10/2016  . Parkinson's syndrome ([place]) 06/14/2016  . Arthritis 06/14/2016  . Depression 06/14/2016  . Heartburn 06/14/2016  . Urinary incontinence 06/14/2016  . Skin lesion 06/14/2016  . Dementia 06/14/2016  . Bilateral edema of lower extremity 06/14/2016  Achilles Dunk, OTR/L, CLT  Lovett,Amy 10/06/2017, 3:14 PM  [place] MAIN [facility] SERVICES [address] [place], [place], [address] Phone: [phone]   Fax:  [phone]  Name: Librado Guandique MRN: [id number] Date of Birth: 1933/05/27

## 2017-10-09 ENCOUNTER — Encounter: Payer: Self-pay | Admitting: Occupational Therapy

## 2017-10-09 NOTE — Therapy (Signed)
[place] MAIN [facility] SERVICES [address] [place], [place], [address] Phone: [phone]   Fax:  [phone]  Occupational Therapy Treatment  Patient Details  Name: Luke Durham MRN: [id number] Date of Birth: Oct 17, 1933 Referring Provider (Historical): Burnette   Encounter Date: 10/06/2017  OT End of Session - 10/09/17 1006    Visit Number  51    Number of Visits  95    Date for OT Re-Evaluation  11/10/17    Authorization Type  Medicare visit 76g codes    OT Start Time  1101    OT Stop Time  1200    OT Time Calculation (min)  59 min    Activity Tolerance  Patient tolerated treatment well    Behavior During Therapy  [facility] for tasks assessed/performed       Past Medical History:  Diagnosis Date  . Anxiety   . Arthritis   . Asthma    childhood asthma  . Cancer ([place]) 7 years ago   lymphoma, [place] (recent)  . Chronic kidney disease   . Colon polyps   . GERD (gastroesophageal reflux disease)   . History of kidney stones   . Parkinson disease [facility])     Past Surgical History:  Procedure Laterality Date  . COLON SURGERY    . CYSTOSCOPY WITH LITHOLAPAXY N/A 11/14/2016   Procedure: CYSTOSCOPY WITH LITHOLAPAXY;  Surgeon: Hollice Espy, MD;  Location: [facility] ORS;  Service: Urology;  Laterality: N/A;  . EYE SURGERY Bilateral    Cataract Extraction with IOL  . HERNIA REPAIR  20 years  . MOHS SURGERY    . SMALL INTESTINE SURGERY     Per patient 7 years  . TRANSURETHRAL RESECTION OF PROSTATE N/A 11/14/2016   Procedure: TRANSURETHRAL RESECTION OF THE PROSTATE (TURP);  Surgeon: Hollice Espy, MD;  Location: [facility] ORS;  Service: Urology;  Laterality: N/A;    There were no vitals filed for this visit.  Subjective Assessment - 10/09/17 1002    Subjective   Patient reports he will plan to take next week off and return the week of Christmas.  He is planning to still do daily exercises but will not have therapy appts.     Pertinent History   Patient reports he was diagnosed with Parkinson's disease about 10 plus years ago.  He reports a more recent decline in function in his daily activities in the last 6-12 months.  He has been seeing PT for the last couple months.    Patient Stated Goals  Patient reports he wants to be able to do more for himself and around the house.     Currently in Pain?  No/denies    Pain Score  0-No pain                   OT Treatments/Exercises (OP) - 10/08/17 1003      ADLs   ADL Comments  Pt seen for focus on turning behaviors in small spaces throughout the building and clinic with alcoves, bathroom, and elevator as spaces for turns in smaller areas with cues for placement of self and rollator for optimal safety and technique.  Patient requires moderate cues and benefits from repetition of task.      Neurological Re-education Exercises   Other Exercises 1  Patient seen for instruction of LSVT BIG exercises: LSVT Daily Session Maximal Daily Exercises: Sustained movements are designed to rescale the amplitude of movement output for generalization to daily functional activities. Performed as  follows for 1 set of 10 repetitions each: Multi directional sustained movements- 1) Floor to ceiling, 2) Side to side. Multi directional Repetitive movements performed in standing and are designed to provide retraining effort needed for sustained muscle activation in tasks Performed as follows: 3) Step and reach forward, 4) Step and Reach Backwards, 5) Step and reach sideways, 6) Rock and reach forward/backward, 7) Rock and reach sideways. Exercises performed in standard version without the use of a chair with min assist to min guard from therapist and verbal cues.    Other Exercises 2  Functional mobility for 5 trials of 100 feet with stop and go patterns as well as quick turns on command.             OT Education - 10/09/17 1006    Education provided  Yes    Education Details  safety and positioning  with rollator    Person(s) Educated  Patient    Methods  Explanation;Demonstration;Verbal cues    Comprehension  Verbalized understanding;Returned demonstration;Verbal cues required;Need further instruction          OT Long Term Goals - 09/16/17 1308      OT LONG TERM GOAL #1   Title  Patient will demonstrate increase right hand grip to be able to cut food with modified independence.     Baseline  occasional assist required.    Time  12    Period  Weeks    Status  Partially Met      OT LONG TERM GOAL #2   Title  Patient will complete donning long sleeve shirt with modified independence.    Baseline  requires assist with long sleeves, can perform short sleeves.    Time  12    Period  Weeks    Status  Achieved      OT LONG TERM GOAL #3   Title  Patient will improve coordination to manage buttons on clothing with occasional assistance only.    Baseline  requires assist with buttons each time    Time  12    Period  Weeks    Status  Partially Met      OT LONG TERM GOAL #4   Title  Patient will complete managing pants after toileting with modified independence.     Baseline  assist most days     Time  12    Period  Weeks    Status  Achieved      OT LONG TERM GOAL #5   Title  Patient will demonstrate turning behaviors with rollator using large steps and keeping feet behind the rollator with all turns with occasional minimal cues.     Time  12    Period  Weeks    Status  New      OT LONG TERM GOAL #6   Title  Patient will improve gait speed and endurance and be able to walk 1050 feet in 6 minutes to negotiate around the home and community safely in 4 weeks    Baseline  1324 at recert 4/0/10    Time  4    Period  Weeks    Status  Revised      OT LONG TERM GOAL #7   Title  Patient will complete HEP for maximal daily exercises with modified independence in 4 weeks    Time  12    Period  Weeks    Status  Partially Met      OT LONG TERM GOAL #  8   Title  Patient will  transfer from sit to stand without the use of arms safely and independently from a variety of chairs/surfaces in 4 weeks.     Baseline  difficulty from lower surfaces    Time  4    Period  Weeks    Status  Achieved      OT LONG TERM GOAL  #9   Baseline  Patient will demonstrate decreased episodes of freezing of behaviors from score of 16 to score of less than 12.    Time  4    Period  Weeks    Status  Achieved            Plan - 10/09/17 1006    Clinical Impression Statement  Patient continues to benefit from instruction of turning in small spaces and managing rollator as well as positioning of self for task.  He benefits from repetition of task for carry over and to ingrain into his daily  routine.  He will require additional instruction and attention to this area to become more proficient in task.      Occupational Profile and client history currently impacting functional performance  progressive disease process, freezing of gait behaviors, fall risk    Occupational performance deficits (Please refer to evaluation for details):  ADL's;IADL's;Leisure    Rehab Potential  Good    Current Impairments/barriers affecting progress:  positive: family support, negative: level of motivation, progressive disease    OT Frequency  2x / week    OT Duration  12 weeks    OT Treatment/Interventions  Self-care/ADL training;Moist Heat;DME and/or AE instruction;Balance training;Therapeutic activities;Therapeutic exercise;Neuromuscular education;Functional Mobility Training;Patient/family education    Consulted and Agree with Plan of Care  Patient       Patient will benefit from skilled therapeutic intervention in order to improve the following deficits and impairments:  Abnormal gait, Decreased coordination, Decreased range of motion, Difficulty walking, Decreased endurance, Decreased safety awareness, Decreased activity tolerance, Decreased balance, Impaired UE functional use, Pain, Decreased mobility,  Decreased strength  Visit Diagnosis: Difficulty in walking, not elsewhere classified  Muscle weakness (generalized)  Unsteadiness on feet  Other lack of coordination  Abnormal posture    Problem List Patient Active Problem List   Diagnosis Date Noted  . BPH with urinary obstruction 11/14/2016  . Urinary frequency 10/10/2016  . Microscopic hematuria 10/10/2016  . Parkinson's syndrome ([place]) 06/14/2016  . Arthritis 06/14/2016  . Depression 06/14/2016  . Heartburn 06/14/2016  . Urinary incontinence 06/14/2016  . Skin lesion 06/14/2016  . Dementia 06/14/2016  . Bilateral edema of lower extremity 06/14/2016   Achilles Dunk, OTR/L, CLT  Lovett,Amy 10/09/2017, 10:09 AM  [place] [facility] SERVICES [address] [place], [place], [address] Phone: [phone]   Fax:  [phone]  Name: Luke Durham MRN: [id number] Date of Birth: 03/11/1933

## 2017-10-16 ENCOUNTER — Encounter: Payer: Medicare Other | Admitting: Occupational Therapy

## 2017-10-20 ENCOUNTER — Ambulatory Visit: Payer: Medicare Other | Admitting: Occupational Therapy

## 2017-10-20 DIAGNOSIS — M6281 Muscle weakness (generalized): Secondary | ICD-10-CM

## 2017-10-20 DIAGNOSIS — R262 Difficulty in walking, not elsewhere classified: Secondary | ICD-10-CM

## 2017-10-20 DIAGNOSIS — R293 Abnormal posture: Secondary | ICD-10-CM

## 2017-10-20 DIAGNOSIS — R2681 Unsteadiness on feet: Secondary | ICD-10-CM

## 2017-10-20 DIAGNOSIS — R278 Other lack of coordination: Secondary | ICD-10-CM

## 2017-10-23 ENCOUNTER — Encounter: Payer: Self-pay | Admitting: Occupational Therapy

## 2017-10-23 NOTE — Therapy (Signed)
[place] MAIN [facility] SERVICES [address] [place], [place], [address] Phone: [phone]   Fax:  [phone]  Occupational Therapy Treatment  Patient Details  Name: Luke Durham MRN: [id number] Date of Birth: August 14, 1933 No Data Recorded  Encounter Date: 10/20/2017  OT End of Session - 10/23/17 1637    Visit Number  67    Number of Visits  95    Date for OT Re-Evaluation  11/10/17    Authorization Type  Medicare visit 77 g codes    OT Start Time  1100    OT Stop Time  1202    OT Time Calculation (min)  62 min    Activity Tolerance  Patient tolerated treatment well    Behavior During Therapy  [facility] for tasks assessed/performed       Past Medical History:  Diagnosis Date  . Anxiety   . Arthritis   . Asthma    childhood asthma  . Cancer ([place]) 7 years ago   lymphoma, [place] (recent)  . Chronic kidney disease   . Colon polyps   . GERD (gastroesophageal reflux disease)   . History of kidney stones   . Parkinson disease [facility])     Past Surgical History:  Procedure Laterality Date  . COLON SURGERY    . CYSTOSCOPY WITH LITHOLAPAXY N/A 11/14/2016   Procedure: CYSTOSCOPY WITH LITHOLAPAXY;  Surgeon: Hollice Espy, MD;  Location: [facility] ORS;  Service: Urology;  Laterality: N/A;  . EYE SURGERY Bilateral    Cataract Extraction with IOL  . HERNIA REPAIR  20 years  . MOHS SURGERY    . SMALL INTESTINE SURGERY     Per patient 7 years  . TRANSURETHRAL RESECTION OF PROSTATE N/A 11/14/2016   Procedure: TRANSURETHRAL RESECTION OF THE PROSTATE (TURP);  Surgeon: Hollice Espy, MD;  Location: [facility] ORS;  Service: Urology;  Laterality: N/A;    There were no vitals filed for this visit.  Subjective Assessment - 10/23/17 1636    Subjective   Patient denies any falls since his last session, however he reports his wife fell stepping onto curb [place], she is ok but has brusing at her eye.     Pertinent History  Patient reports he was  diagnosed with Parkinson's disease about 10 plus years ago.  He reports a more recent decline in function in his daily activities in the last 6-12 months.  He has been seeing PT for the last couple months.    Patient Stated Goals  Patient reports he wants to be able to do more for himself and around the house.     Currently in Pain?  No/denies    Pain Score  0-No pain                   OT Treatments/Exercises (OP) - 10/23/17 1809      Neurological Re-education Exercises   Other Exercises 1  Patient seen for instruction of LSVT BIG exercises: LSVT Daily Session Maximal Daily Exercises: Sustained movements are designed to rescale the amplitude of movement output for generalization to daily functional activities. Performed as follows for 1 set of 10 repetitions each: Multi directional sustained movements- 1) Floor to ceiling, 2) Side to side. Multi directional Repetitive movements performed in standing and are designed to provide retraining effort needed for sustained muscle activation in tasks Performed as follows: 3) Step and reach forward, 4) Step and Reach Backwards, 5) Step and reach sideways, 6) Rock and reach  forward/backward, 7) Rock and reach sideways. Exercises performed in standard version without the use of a chair with min assist to min guard from therapist and verbal cues.    Other Exercises 2  Patient seen for focus on calibration of movements with cues for amplitude of steps during ambulation and especially with turning.  Patient requires repeated trials with increased repetition for task.  Patient performing reciprocal stepping patterns on steps, stair negotiation with cues and SBA.              OT Education - 10/23/17 1636    Education provided  Yes    Education Details  HEP, balance, rollator for turns    Person(s) Educated  Patient    Methods  Explanation;Demonstration;Verbal cues    Comprehension  Verbalized understanding;Returned demonstration;Verbal cues  required          OT Long Term Goals - 09/16/17 1308      OT LONG TERM GOAL #1   Title  Patient will demonstrate increase right hand grip to be able to cut food with modified independence.     Baseline  occasional assist required.    Time  12    Period  Weeks    Status  Partially Met      OT LONG TERM GOAL #2   Title  Patient will complete donning long sleeve shirt with modified independence.    Baseline  requires assist with long sleeves, can perform short sleeves.    Time  12    Period  Weeks    Status  Achieved      OT LONG TERM GOAL #3   Title  Patient will improve coordination to manage buttons on clothing with occasional assistance only.    Baseline  requires assist with buttons each time    Time  12    Period  Weeks    Status  Partially Met      OT LONG TERM GOAL #4   Title  Patient will complete managing pants after toileting with modified independence.     Baseline  assist most days     Time  12    Period  Weeks    Status  Achieved      OT LONG TERM GOAL #5   Title  Patient will demonstrate turning behaviors with rollator using large steps and keeping feet behind the rollator with all turns with occasional minimal cues.     Time  12    Period  Weeks    Status  New      OT LONG TERM GOAL #6   Title  Patient will improve gait speed and endurance and be able to walk 1050 feet in 6 minutes to negotiate around the home and community safely in 4 weeks    Baseline  6962 at recert 06/29/27    Time  4    Period  Weeks    Status  Revised      OT LONG TERM GOAL #7   Title  Patient will complete HEP for maximal daily exercises with modified independence in 4 weeks    Time  12    Period  Weeks    Status  Partially Met      OT LONG TERM GOAL #8   Title  Patient will transfer from sit to stand without the use of arms safely and independently from a variety of chairs/surfaces in 4 weeks.     Baseline  difficulty from lower surfaces  Time  4    Period  Weeks     Status  Achieved      OT LONG TERM GOAL  #9   Baseline  Patient will demonstrate decreased episodes of freezing of behaviors from score of 16 to score of less than 12.    Time  4    Period  Weeks    Status  Achieved            Plan - 10/23/17 1637    Clinical Impression Statement  Patient reports he is feeling like his freezing of gait is more frequent however this is not seen in the clinic.  He reports this occurs more often at night and may be due to times when his medication is wearing off.  Will continue to work towards improving amplitude of steps and positioning of self in relation to rollator when turning.     Occupational Profile and client history currently impacting functional performance  progressive disease process, freezing of gait behaviors, fall risk    Occupational performance deficits (Please refer to evaluation for details):  ADL's;IADL's;Leisure    Rehab Potential  Good    Current Impairments/barriers affecting progress:  positive: family support, negative: level of motivation, progressive disease    OT Frequency  2x / week    OT Duration  12 weeks    OT Treatment/Interventions  Self-care/ADL training;Moist Heat;DME and/or AE instruction;Balance training;Therapeutic activities;Therapeutic exercise;Neuromuscular education;Functional Mobility Training;Patient/family education    Consulted and Agree with Plan of Care  Patient       Patient will benefit from skilled therapeutic intervention in order to improve the following deficits and impairments:  Abnormal gait, Decreased coordination, Decreased range of motion, Difficulty walking, Decreased endurance, Decreased safety awareness, Decreased activity tolerance, Decreased balance, Impaired UE functional use, Pain, Decreased mobility, Decreased strength  Visit Diagnosis: Difficulty in walking, not elsewhere classified  Muscle weakness (generalized)  Unsteadiness on feet  Other lack of coordination  Abnormal  posture    Problem List Patient Active Problem List   Diagnosis Date Noted  . BPH with urinary obstruction 11/14/2016  . Urinary frequency 10/10/2016  . Microscopic hematuria 10/10/2016  . Parkinson's syndrome ([place]) 06/14/2016  . Arthritis 06/14/2016  . Depression 06/14/2016  . Heartburn 06/14/2016  . Urinary incontinence 06/14/2016  . Skin lesion 06/14/2016  . Dementia 06/14/2016  . Bilateral edema of lower extremity 06/14/2016   Achilles Dunk, OTR/L, CLT  Lovett,Amy 10/23/2017, 6:14 PM  [place] MAIN [facility] SERVICES [address] [place], [place], [address] Phone: [phone]   Fax:  [phone]  Name: Rollie Hynek MRN: [id number] Date of Birth: 1933-01-15

## 2017-10-27 ENCOUNTER — Ambulatory Visit: Payer: Medicare Other | Attending: Neurology | Admitting: Occupational Therapy

## 2017-10-27 DIAGNOSIS — R293 Abnormal posture: Secondary | ICD-10-CM

## 2017-10-27 DIAGNOSIS — M6281 Muscle weakness (generalized): Secondary | ICD-10-CM | POA: Insufficient documentation

## 2017-10-27 DIAGNOSIS — M25511 Pain in right shoulder: Secondary | ICD-10-CM | POA: Diagnosis present

## 2017-10-27 DIAGNOSIS — R278 Other lack of coordination: Secondary | ICD-10-CM | POA: Diagnosis present

## 2017-10-27 DIAGNOSIS — R262 Difficulty in walking, not elsewhere classified: Secondary | ICD-10-CM | POA: Diagnosis not present

## 2017-10-27 DIAGNOSIS — G8929 Other chronic pain: Secondary | ICD-10-CM | POA: Diagnosis present

## 2017-10-27 DIAGNOSIS — R2681 Unsteadiness on feet: Secondary | ICD-10-CM

## 2017-10-28 NOTE — Therapy (Signed)
[place] MAIN [facility] SERVICES [address] [place], [place], [address] Phone: [phone]   Fax:  [phone]  Occupational Therapy Treatment  Patient Details  Name: Luke Durham MRN: [id number] Date of Birth: 07-22-1933 No Data Recorded  Encounter Date: 10/27/2017  OT End of Session - 10/28/17 1908    Visit Number  35    Number of Visits  95    Date for OT Re-Evaluation  11/10/17    OT Start Time  1100    OT Stop Time  1200    OT Time Calculation (min)  60 min    Activity Tolerance  Patient tolerated treatment well    Behavior During Therapy  [facility] for tasks assessed/performed       Past Medical History:  Diagnosis Date  . Anxiety   . Arthritis   . Asthma    childhood asthma  . Cancer ([place]) 7 years ago   lymphoma, [place] (recent)  . Chronic kidney disease   . Colon polyps   . GERD (gastroesophageal reflux disease)   . History of kidney stones   . Parkinson disease [facility])     Past Surgical History:  Procedure Laterality Date  . COLON SURGERY    . CYSTOSCOPY WITH LITHOLAPAXY N/A 11/14/2016   Procedure: CYSTOSCOPY WITH LITHOLAPAXY;  Surgeon: Hollice Espy, MD;  Location: [facility] ORS;  Service: Urology;  Laterality: N/A;  . EYE SURGERY Bilateral    Cataract Extraction with IOL  . HERNIA REPAIR  20 years  . MOHS SURGERY    . SMALL INTESTINE SURGERY     Per patient 7 years  . TRANSURETHRAL RESECTION OF PROSTATE N/A 11/14/2016   Procedure: TRANSURETHRAL RESECTION OF THE PROSTATE (TURP);  Surgeon: Hollice Espy, MD;  Location: [facility] ORS;  Service: Urology;  Laterality: N/A;    There were no vitals filed for this visit.  Subjective Assessment - 10/28/17 1907    Subjective   Patient reports he fell this morning, was able to get up and denies any injury.     Pertinent History  Patient reports he was diagnosed with Parkinson's disease about 10 plus years ago.  He reports a more recent decline in function in his daily activities in the  last 6-12 months.  He has been seeing PT for the last couple months.    Patient Stated Goals  Patient reports he wants to be able to do more for himself and around the house.     Currently in Pain?  No/denies    Pain Score  0-No pain                   OT Treatments/Exercises (OP) - 10/28/17 1909      Neurological Re-education Exercises   Other Exercises 1  Patient seen for instruction of LSVT BIG exercises: LSVT Daily Session Maximal Daily Exercises: Sustained movements are designed to rescale the amplitude of movement output for generalization to daily functional activities. Performed as follows for 1 set of 10 repetitions each: Multi directional sustained movements- 1) Floor to ceiling, 2) Side to side. Multi directional Repetitive movements performed in standing and are designed to provide retraining effort needed for sustained muscle activation in tasks Performed as follows: 3) Step and reach forward, 4) Step and Reach Backwards, 5) Step and reach sideways, 6) Rock and reach forward/backward, 7) Rock and reach sideways. Exercises performed in standard version without the use of a chair with min assist to min guard from therapist and verbal  cues.    Other Exercises 2  Patient seen for focus on calibration of movements with cues for amplitude of steps during ambulation and especially with turning.  Patient requires repeated trials with increased repetition for task.  Patient performing reciprocal stepping patterns on steps, stair negotiation with cues and SBA. Continued focus on posture this date with additional therapist manual techniques, as well as exercise lying down with towel roll to back.              OT Education - 10/28/17 1908    Education provided  Yes    Person(s) Educated  Patient    Methods  Explanation;Demonstration;Verbal cues    Comprehension  Verbal cues required;Returned demonstration;Verbalized understanding          OT Long Term Goals - 09/16/17 1308       OT LONG TERM GOAL #1   Title  Patient will demonstrate increase right hand grip to be able to cut food with modified independence.     Baseline  occasional assist required.    Time  12    Period  Weeks    Status  Partially Met      OT LONG TERM GOAL #2   Title  Patient will complete donning long sleeve shirt with modified independence.    Baseline  requires assist with long sleeves, can perform short sleeves.    Time  12    Period  Weeks    Status  Achieved      OT LONG TERM GOAL #3   Title  Patient will improve coordination to manage buttons on clothing with occasional assistance only.    Baseline  requires assist with buttons each time    Time  12    Period  Weeks    Status  Partially Met      OT LONG TERM GOAL #4   Title  Patient will complete managing pants after toileting with modified independence.     Baseline  assist most days     Time  12    Period  Weeks    Status  Achieved      OT LONG TERM GOAL #5   Title  Patient will demonstrate turning behaviors with rollator using large steps and keeping feet behind the rollator with all turns with occasional minimal cues.     Time  12    Period  Weeks    Status  New      OT LONG TERM GOAL #6   Title  Patient will improve gait speed and endurance and be able to walk 1050 feet in 6 minutes to negotiate around the home and community safely in 4 weeks    Baseline  1324 at recert 4/0/10    Time  4    Period  Weeks    Status  Revised      OT LONG TERM GOAL #7   Title  Patient will complete HEP for maximal daily exercises with modified independence in 4 weeks    Time  12    Period  Weeks    Status  Partially Met      OT LONG TERM GOAL #8   Title  Patient will transfer from sit to stand without the use of arms safely and independently from a variety of chairs/surfaces in 4 weeks.     Baseline  difficulty from lower surfaces    Time  4    Period  Weeks    Status  Achieved  OT LONG TERM GOAL  #9   Baseline   Patient will demonstrate decreased episodes of freezing of behaviors from score of 16 to score of less than 12.    Time  4    Period  Weeks    Status  Achieved            Plan - 10/28/17 1908    Clinical Impression Statement  Patient reports a fall this am, he is not sure what happened but felt like his foot got stuck and tripped, he denies any injury to self and was able to get up from the floor without assistance.  Discussed safety and fall reduction techniques at home, patient demonstrates understanding will continue to work towards reducing risk of falls, managing adaptive devices while ambulating, turning skills and reduction of freezing of gait behaviors.  Patient has reported increased difficulty with freezing at night and early in the am and discussed this with his physician who adjusted his medication this week to see if this will help.  Patient continues to benefit from skilled OT to maximize safety and independence in daily tasks.      Occupational Profile and client history currently impacting functional performance  progressive disease process, freezing of gait behaviors, fall risk    Occupational performance deficits (Please refer to evaluation for details):  ADL's;IADL's;Leisure    Rehab Potential  Good    Current Impairments/barriers affecting progress:  positive: family support, negative: level of motivation, progressive disease    OT Frequency  2x / week    OT Duration  12 weeks    OT Treatment/Interventions  Self-care/ADL training;Moist Heat;DME and/or AE instruction;Balance training;Therapeutic activities;Therapeutic exercise;Neuromuscular education;Functional Mobility Training;Patient/family education    Consulted and Agree with Plan of Care  Patient       Patient will benefit from skilled therapeutic intervention in order to improve the following deficits and impairments:  Abnormal gait, Decreased coordination, Decreased range of motion, Difficulty walking, Decreased  endurance, Decreased safety awareness, Decreased activity tolerance, Decreased balance, Impaired UE functional use, Pain, Decreased mobility, Decreased strength  Visit Diagnosis: Difficulty in walking, not elsewhere classified  Muscle weakness (generalized)  Unsteadiness on feet  Other lack of coordination  Abnormal posture    Problem List Patient Active Problem List   Diagnosis Date Noted  . BPH with urinary obstruction 11/14/2016  . Urinary frequency 10/10/2016  . Microscopic hematuria 10/10/2016  . Parkinson's syndrome ([place]) 06/14/2016  . Arthritis 06/14/2016  . Depression 06/14/2016  . Heartburn 06/14/2016  . Urinary incontinence 06/14/2016  . Skin lesion 06/14/2016  . Dementia 06/14/2016  . Bilateral edema of lower extremity 06/14/2016   Achilles Dunk, OTR/L, CLT  Lovett,Amy 10/28/2017, 7:11 PM  [place] MAIN [facility] SERVICES [address] [place], [place], [address] Phone: [phone]   Fax:  [phone]  Name: Luke Durham MRN: [id number] Date of Birth: Dec 17, 1932

## 2017-10-31 ENCOUNTER — Ambulatory Visit: Payer: Medicare Other | Admitting: Occupational Therapy

## 2017-10-31 DIAGNOSIS — R262 Difficulty in walking, not elsewhere classified: Secondary | ICD-10-CM

## 2017-10-31 DIAGNOSIS — R278 Other lack of coordination: Secondary | ICD-10-CM

## 2017-10-31 DIAGNOSIS — R293 Abnormal posture: Secondary | ICD-10-CM

## 2017-10-31 DIAGNOSIS — M6281 Muscle weakness (generalized): Secondary | ICD-10-CM

## 2017-10-31 DIAGNOSIS — R2681 Unsteadiness on feet: Secondary | ICD-10-CM

## 2017-11-01 ENCOUNTER — Encounter: Payer: Self-pay | Admitting: Occupational Therapy

## 2017-11-03 ENCOUNTER — Ambulatory Visit: Payer: Medicare Other | Admitting: Occupational Therapy

## 2017-11-03 DIAGNOSIS — R293 Abnormal posture: Secondary | ICD-10-CM

## 2017-11-03 DIAGNOSIS — R2681 Unsteadiness on feet: Secondary | ICD-10-CM

## 2017-11-03 DIAGNOSIS — R278 Other lack of coordination: Secondary | ICD-10-CM

## 2017-11-03 DIAGNOSIS — R262 Difficulty in walking, not elsewhere classified: Secondary | ICD-10-CM

## 2017-11-03 DIAGNOSIS — M6281 Muscle weakness (generalized): Secondary | ICD-10-CM

## 2017-11-06 ENCOUNTER — Ambulatory Visit: Payer: Medicare Other | Admitting: Occupational Therapy

## 2017-11-06 DIAGNOSIS — R278 Other lack of coordination: Secondary | ICD-10-CM

## 2017-11-06 DIAGNOSIS — M6281 Muscle weakness (generalized): Secondary | ICD-10-CM

## 2017-11-06 DIAGNOSIS — R262 Difficulty in walking, not elsewhere classified: Secondary | ICD-10-CM

## 2017-11-06 DIAGNOSIS — R293 Abnormal posture: Secondary | ICD-10-CM

## 2017-11-06 DIAGNOSIS — R2681 Unsteadiness on feet: Secondary | ICD-10-CM

## 2017-11-10 ENCOUNTER — Ambulatory Visit: Payer: Medicare Other | Admitting: Occupational Therapy

## 2017-11-10 DIAGNOSIS — R2681 Unsteadiness on feet: Secondary | ICD-10-CM

## 2017-11-10 DIAGNOSIS — R293 Abnormal posture: Secondary | ICD-10-CM

## 2017-11-10 DIAGNOSIS — R262 Difficulty in walking, not elsewhere classified: Secondary | ICD-10-CM | POA: Diagnosis not present

## 2017-11-10 DIAGNOSIS — M6281 Muscle weakness (generalized): Secondary | ICD-10-CM

## 2017-11-10 DIAGNOSIS — R278 Other lack of coordination: Secondary | ICD-10-CM

## 2017-11-11 ENCOUNTER — Encounter: Payer: Self-pay | Admitting: Occupational Therapy

## 2017-11-11 NOTE — Therapy (Signed)
[place] MAIN [facility] SERVICES [address] [place], [place], [address] Phone: [phone]   Fax:  [phone]  Occupational Therapy Treatment  Patient Details  Name: Luke Durham MRN: [id number] Date of Birth: 09/24/1933 No Data Recorded  Encounter Date: 11/06/2017  OT End of Session - 11/11/17 1729    Visit Number  81    Number of Visits  95    Date for OT Re-Evaluation  11/10/17    OT Start Time  1400    OT Stop Time  1500    OT Time Calculation (min)  60 min    Activity Tolerance  Patient tolerated treatment well    Behavior During Therapy  [facility] for tasks assessed/performed       Past Medical History:  Diagnosis Date  . Anxiety   . Arthritis   . Asthma    childhood asthma  . Cancer ([place]) 7 years ago   lymphoma, [place] (recent)  . Chronic kidney disease   . Colon polyps   . GERD (gastroesophageal reflux disease)   . History of kidney stones   . Parkinson disease [facility])     Past Surgical History:  Procedure Laterality Date  . COLON SURGERY    . CYSTOSCOPY WITH LITHOLAPAXY N/A 11/14/2016   Procedure: CYSTOSCOPY WITH LITHOLAPAXY;  Surgeon: Hollice Espy, MD;  Location: [facility] ORS;  Service: Urology;  Laterality: N/A;  . EYE SURGERY Bilateral    Cataract Extraction with IOL  . HERNIA REPAIR  20 years  . MOHS SURGERY    . SMALL INTESTINE SURGERY     Per patient 7 years  . TRANSURETHRAL RESECTION OF PROSTATE N/A 11/14/2016   Procedure: TRANSURETHRAL RESECTION OF THE PROSTATE (TURP);  Surgeon: Hollice Espy, MD;  Location: [facility] ORS;  Service: Urology;  Laterality: N/A;    There were no vitals filed for this visit.  Subjective Assessment - 11/11/17 1724    Subjective   Patient reports he still feels really sleepy and anytime he sits down he falls asleep at home. He reports he is still able to sleep at night without difficulty.     Pertinent History  Patient reports he was diagnosed with Parkinson's disease about 10 plus years  ago.  He reports a more recent decline in function in his daily activities in the last 6-12 months.  He has been seeing PT for the last couple months.    Patient Stated Goals  Patient reports he wants to be able to do more for himself and around the house.     Currently in Pain?  No/denies    Pain Score  0-No pain                   OT Treatments/Exercises (OP) - 11/11/17 1726      Neurological Re-education Exercises   Other Exercises 1  Patient seen for instruction of LSVT BIG exercises: LSVT Daily Session Maximal Daily Exercises: Sustained movements are designed to rescale the amplitude of movement output for generalization to daily functional activities. Performed as follows for 1 set of 10 repetitions each: Multi directional sustained movements- 1) Floor to ceiling, 2) Side to side. Multi directional Repetitive movements performed in standing and are designed to provide retraining effort needed for sustained muscle activation in tasks Performed as follows: 3) Step and reach forward, 4) Step and Reach Backwards, 5) Step and reach sideways, 6) Rock and reach forward/backward, 7) Rock and reach sideways. Exercises performed in standard version without the  use of a chair with min assist to min guard from therapist and verbal cues.    Other Exercises 2  Patient seen for initiation and termination of gait patterns to help diminish freezing of gait behaviors, turning towards left and right with cues for positioning of self in relation to rollator as well as amplitude of steps to position self. Functional mobility with emphasis on amplitude of steps for 2 trials of 300 feet.               OT Education - 11/11/17 1729    Education provided  Yes    Education Details  amplitude of gait, functional mobility, turns    Person(s) Educated  Patient    Methods  Explanation;Demonstration;Verbal cues    Comprehension  Verbal cues required;Returned demonstration;Verbalized understanding           OT Long Term Goals - 09/16/17 1308      OT LONG TERM GOAL #1   Title  Patient will demonstrate increase right hand grip to be able to cut food with modified independence.     Baseline  occasional assist required.    Time  12    Period  Weeks    Status  Partially Met      OT LONG TERM GOAL #2   Title  Patient will complete donning long sleeve shirt with modified independence.    Baseline  requires assist with long sleeves, can perform short sleeves.    Time  12    Period  Weeks    Status  Achieved      OT LONG TERM GOAL #3   Title  Patient will improve coordination to manage buttons on clothing with occasional assistance only.    Baseline  requires assist with buttons each time    Time  12    Period  Weeks    Status  Partially Met      OT LONG TERM GOAL #4   Title  Patient will complete managing pants after toileting with modified independence.     Baseline  assist most days     Time  12    Period  Weeks    Status  Achieved      OT LONG TERM GOAL #5   Title  Patient will demonstrate turning behaviors with rollator using large steps and keeping feet behind the rollator with all turns with occasional minimal cues.     Time  12    Period  Weeks    Status  New      OT LONG TERM GOAL #6   Title  Patient will improve gait speed and endurance and be able to walk 1050 feet in 6 minutes to negotiate around the home and community safely in 4 weeks    Baseline  5051 at recert [birth year]    Time  4    Period  Weeks    Status  Revised      OT LONG TERM GOAL #7   Title  Patient will complete HEP for maximal daily exercises with modified independence in 4 weeks    Time  12    Period  Weeks    Status  Partially Met      OT LONG TERM GOAL #8   Title  Patient will transfer from sit to stand without the use of arms safely and independently from a variety of chairs/surfaces in 4 weeks.     Baseline  difficulty from lower surfaces  Time  4    Period  Weeks    Status   Achieved      OT LONG TERM GOAL  #9   Baseline  Patient will demonstrate decreased episodes of freezing of behaviors from score of 16 to score of less than 12.    Time  4    Period  Weeks    Status  Achieved            Plan - 11/11/17 1730    Clinical Impression Statement  Patient continues to demonstrate difficulty at times with turning using rollator and positioning of self to reduce risk of falls.  Patient tends to turn walker while he is placed outside of the base of support rather than using larger steps to reposition self behind rollator.  He responds wells to verbal cuing and therapist demo but reports this task is still difficult at home.     Occupational Profile and client history currently impacting functional performance  progressive disease process, freezing of gait behaviors, fall risk    Occupational performance deficits (Please refer to evaluation for details):  ADL's;IADL's;Leisure    Rehab Potential  Good    Current Impairments/barriers affecting progress:  positive: family support, negative: level of motivation, progressive disease    OT Frequency  2x / week    OT Duration  12 weeks    OT Treatment/Interventions  Self-care/ADL training;Moist Heat;DME and/or AE instruction;Balance training;Therapeutic activities;Therapeutic exercise;Neuromuscular education;Functional Mobility Training;Patient/family education    Consulted and Agree with Plan of Care  Patient       Patient will benefit from skilled therapeutic intervention in order to improve the following deficits and impairments:  Abnormal gait, Decreased coordination, Decreased range of motion, Difficulty walking, Decreased endurance, Decreased safety awareness, Decreased activity tolerance, Decreased balance, Impaired UE functional use, Pain, Decreased mobility, Decreased strength  Visit Diagnosis: Difficulty in walking, not elsewhere classified  Muscle weakness (generalized)  Unsteadiness on feet  Abnormal  posture  Other lack of coordination    Problem List Patient Active Problem List   Diagnosis Date Noted  . BPH with urinary obstruction 11/14/2016  . Urinary frequency 10/10/2016  . Microscopic hematuria 10/10/2016  . Parkinson's syndrome ([place]) 06/14/2016  . Arthritis 06/14/2016  . Depression 06/14/2016  . Heartburn 06/14/2016  . Urinary incontinence 06/14/2016  . Skin lesion 06/14/2016  . Dementia 06/14/2016  . Bilateral edema of lower extremity 06/14/2016   Achilles Dunk, OTR/L, CLT  Lovett,Amy 11/11/2017, 5:33 PM  [place] MAIN [facility] SERVICES [address] [place], [place], [address] Phone: [phone]   Fax:  [phone]  Name: Ishan Sanroman MRN: [id number] Date of Birth: 1933-08-10

## 2017-11-11 NOTE — Therapy (Signed)
Luke Durham [facility] SERVICES [address] [place], [place], [address] Phone: [phone]   Fax:  [phone]  Occupational Therapy Treatment  Patient Details  Name: Luke Durham MRN: [id number] Date of Birth: 1933-10-15 No Data Recorded  Encounter Date: 11/10/2017  OT End of Session - 11/11/17 1924    Visit Number  82    Number of Visits  95    Date for OT Re-Evaluation  11/10/17    OT Start Time  1200    OT Stop Time  1300    OT Time Calculation (min)  60 min    Activity Tolerance  Patient tolerated treatment well    Behavior During Therapy  [facility] for tasks assessed/performed       Past Medical History:  Diagnosis Date  . Anxiety   . Arthritis   . Asthma    childhood asthma  . Cancer ([place]) 7 years ago   lymphoma, [place] (recent)  . Chronic kidney disease   . Colon polyps   . GERD (gastroesophageal reflux disease)   . History of kidney stones   . Parkinson disease [facility])     Past Surgical History:  Procedure Laterality Date  . COLON SURGERY    . CYSTOSCOPY WITH LITHOLAPAXY N/A 11/14/2016   Procedure: CYSTOSCOPY WITH LITHOLAPAXY;  Surgeon: Hollice Espy, MD;  Location: [facility] ORS;  Service: Urology;  Laterality: N/A;  . EYE SURGERY Bilateral    Cataract Extraction with IOL  . HERNIA REPAIR  20 years  . MOHS SURGERY    . SMALL INTESTINE SURGERY     Per patient 7 years  . TRANSURETHRAL RESECTION OF PROSTATE N/A 11/14/2016   Procedure: TRANSURETHRAL RESECTION OF THE PROSTATE (TURP);  Surgeon: Hollice Espy, MD;  Location: [facility] ORS;  Service: Urology;  Laterality: N/A;    There were no vitals filed for this visit.  Subjective Assessment - 11/11/17 1923    Pertinent History  Patient reports he was diagnosed with Parkinson's disease about 10 plus years ago.  He reports a more recent decline in function in his daily activities in the last 6-12 months.  He has been seeing PT for the last couple months.    Patient Stated Goals   Patient reports he wants to be able to do more for himself and around the house.     Currently in Pain?  No/denies    Pain Score  0-No pain                   OT Treatments/Exercises (OP) - 11/11/17 1726      Neurological Re-education Exercises   Other Exercises 1  Patient seen for instruction of LSVT BIG exercises: LSVT Daily Session Maximal Daily Exercises: Sustained movements are designed to rescale the amplitude of movement output for generalization to daily functional activities. Performed as follows for 1 set of 10 repetitions each: Multi directional sustained movements- 1) Floor to ceiling, 2) Side to side. Multi directional Repetitive movements performed in standing and are designed to provide retraining effort needed for sustained muscle activation in tasks Performed as follows: 3) Step and reach forward, 4) Step and Reach Backwards, 5) Step and reach sideways, 6) Rock and reach forward/backward, 7) Rock and reach sideways. Exercises performed in standard version without the use of a chair with min assist to min guard from therapist and verbal cues.    Other Exercises 2  Patient seen for initiation and termination of gait patterns to help diminish freezing of  gait behaviors, turning towards left and right with cues for positioning of self in relation to rollator as well as amplitude of steps to position self. Functional mobility with emphasis on amplitude of steps for 2 trials of 300 feet.               OT Education - 11/11/17 1923    Education provided  Yes    Education Details  turning patterns, positioning, amplitude of steps    Person(s) Educated  Patient    Methods  Explanation;Demonstration;Verbal cues    Comprehension  Verbal cues required;Returned demonstration;Verbalized understanding          OT Long Term Goals - 09/16/17 1308      OT LONG TERM GOAL #1   Title  Patient will demonstrate increase right hand grip to be able to cut food with modified  independence.     Baseline  occasional assist required.    Time  12    Period  Weeks    Status  Partially Met      OT LONG TERM GOAL #2   Title  Patient will complete donning long sleeve shirt with modified independence.    Baseline  requires assist with long sleeves, can perform short sleeves.    Time  12    Period  Weeks    Status  Achieved      OT LONG TERM GOAL #3   Title  Patient will improve coordination to manage buttons on clothing with occasional assistance only.    Baseline  requires assist with buttons each time    Time  12    Period  Weeks    Status  Partially Met      OT LONG TERM GOAL #4   Title  Patient will complete managing pants after toileting with modified independence.     Baseline  assist most days     Time  12    Period  Weeks    Status  Achieved      OT LONG TERM GOAL #5   Title  Patient will demonstrate turning behaviors with rollator using large steps and keeping feet behind the rollator with all turns with occasional minimal cues.     Time  12    Period  Weeks    Status  New      OT LONG TERM GOAL #6   Title  Patient will improve gait speed and endurance and be able to walk 1050 feet in 6 minutes to negotiate around the home and community safely in 4 weeks    Baseline  8333 at recert 05/26/28    Time  4    Period  Weeks    Status  Revised      OT LONG TERM GOAL #7   Title  Patient will complete HEP for maximal daily exercises with modified independence in 4 weeks    Time  12    Period  Weeks    Status  Partially Met      OT LONG TERM GOAL #8   Title  Patient will transfer from sit to stand without the use of arms safely and independently from a variety of chairs/surfaces in 4 weeks.     Baseline  difficulty from lower surfaces    Time  4    Period  Weeks    Status  Achieved      OT LONG TERM GOAL  #9   Baseline  Patient will demonstrate decreased episodes of  freezing of behaviors from score of 16 to score of less than 12.    Time  4     Period  Weeks    Status  Achieved            Plan - 11/11/17 1924    Occupational Profile and client history currently impacting functional performance  progressive disease process, freezing of gait behaviors, fall risk    Occupational performance deficits (Please refer to evaluation for details):  ADL's;IADL's;Leisure    Rehab Potential  Good    Current Impairments/barriers affecting progress:  positive: family support, negative: level of motivation, progressive disease    OT Frequency  2x / week    OT Duration  12 weeks    OT Treatment/Interventions  Self-care/ADL training;Moist Heat;DME and/or AE instruction;Balance training;Therapeutic activities;Therapeutic exercise;Neuromuscular education;Functional Mobility Training;Patient/family education    Consulted and Agree with Plan of Care  Patient       Patient will benefit from skilled therapeutic intervention in order to improve the following deficits and impairments:  Abnormal gait, Decreased coordination, Decreased range of motion, Difficulty walking, Decreased endurance, Decreased safety awareness, Decreased activity tolerance, Decreased balance, Impaired UE functional use, Pain, Decreased mobility, Decreased strength  Visit Diagnosis: Difficulty in walking, not elsewhere classified  Muscle weakness (generalized)  Unsteadiness on feet  Abnormal posture  Other lack of coordination    Problem List Patient Active Problem List   Diagnosis Date Noted  . BPH with urinary obstruction 11/14/2016  . Urinary frequency 10/10/2016  . Microscopic hematuria 10/10/2016  . Parkinson's syndrome ([place]) 06/14/2016  . Arthritis 06/14/2016  . Depression 06/14/2016  . Heartburn 06/14/2016  . Urinary incontinence 06/14/2016  . Skin lesion 06/14/2016  . Dementia 06/14/2016  . Bilateral edema of lower extremity 06/14/2016   Achilles Dunk, OTR/L, CLT  Lovett,Amy 11/11/2017, 8:56 PM  [facility] Durham  [facility] SERVICES [address] [place], [place], [address] Phone: [phone]   Fax:  [phone]  Name: Kalvyn Desa MRN: [id number] Date of Birth: 1932/12/18

## 2017-11-11 NOTE — Therapy (Signed)
[place] MAIN [facility] SERVICES [address] [place], [place], [address] Phone: [phone]   Fax:  [phone]  Occupational Therapy Treatment  Patient Details  Name: Luke Durham MRN: [id number] Date of Birth: 06-25-1933 No Data Recorded  Encounter Date: 10/31/2017  OT End of Session - 11/11/17 1550    Visit Number  48    Number of Visits  95    Date for OT Re-Evaluation  11/10/17    Authorization Type  Medicare visit 79 g codes    OT Start Time  1100    OT Stop Time  1201    OT Time Calculation (min)  61 min    Activity Tolerance  Patient tolerated treatment well    Behavior During Therapy  [facility] for tasks assessed/performed       Past Medical History:  Diagnosis Date  . Anxiety   . Arthritis   . Asthma    childhood asthma  . Cancer ([place]) 7 years ago   lymphoma, [place] (recent)  . Chronic kidney disease   . Colon polyps   . GERD (gastroesophageal reflux disease)   . History of kidney stones   . Parkinson disease [facility])     Past Surgical History:  Procedure Laterality Date  . COLON SURGERY    . CYSTOSCOPY WITH LITHOLAPAXY N/A 11/14/2016   Procedure: CYSTOSCOPY WITH LITHOLAPAXY;  Surgeon: Hollice Espy, MD;  Location: [facility] ORS;  Service: Urology;  Laterality: N/A;  . EYE SURGERY Bilateral    Cataract Extraction with IOL  . HERNIA REPAIR  20 years  . MOHS SURGERY    . SMALL INTESTINE SURGERY     Per patient 7 years  . TRANSURETHRAL RESECTION OF PROSTATE N/A 11/14/2016   Procedure: TRANSURETHRAL RESECTION OF THE PROSTATE (TURP);  Surgeon: Hollice Espy, MD;  Location: [facility] ORS;  Service: Urology;  Laterality: N/A;    There were no vitals filed for this visit.  Subjective Assessment - 11/11/17 1547    Subjective   Patient denies any falls in the last few days.  Doing well but reports he still feeling like freezing behaviors are more often and random in which they occur. he previously felt they were more at night and in the  morning.      Pertinent History  Patient reports he was diagnosed with Parkinson's disease about 10 plus years ago.  He reports a more recent decline in function in his daily activities in the last 6-12 months.  He has been seeing PT for the last couple months.    Patient Stated Goals  Patient reports he wants to be able to do more for himself and around the house.     Currently in Pain?  No/denies    Pain Score  0-No pain                   OT Treatments/Exercises (OP) - 11/11/17 1548      ADLs   ADL Comments  Patient seen for turning in small spaces such as the bathroom and the closet, cues for stepping patterns and use of rollator.  Cues for positioning of self in relation to rollator.       Neurological Re-education Exercises   Other Exercises 1  Patient seen for instruction of LSVT BIG exercises: LSVT Daily Session Maximal Daily Exercises: Sustained movements are designed to rescale the amplitude of movement output for generalization to daily functional activities. Performed as follows for 1 set of 10 repetitions each:  Multi directional sustained movements- 1) Floor to ceiling, 2) Side to side. Multi directional Repetitive movements performed in standing and are designed to provide retraining effort needed for sustained muscle activation in tasks Performed as follows: 3) Step and reach forward, 4) Step and Reach Backwards, 5) Step and reach sideways, 6) Rock and reach forward/backward, 7) Rock and reach sideways. Exercises performed in standard version without the use of a chair with min assist to min guard from therapist and verbal cues.    Other Exercises 2  Patient seen for postural exercises in sitting and standing with manual therapy techniques from therapist, cues both verbal and tactile.              OT Education - 11/11/17 1550    Education provided  Yes    Education Details  posture, turning    Methods  Explanation;Demonstration;Verbal cues;Tactile cues     Comprehension  Verbal cues required;Returned demonstration;Verbalized understanding;Tactile cues required          OT Long Term Goals - 09/16/17 1308      OT LONG TERM GOAL #1   Title  Patient will demonstrate increase right hand grip to be able to cut food with modified independence.     Baseline  occasional assist required.    Time  12    Period  Weeks    Status  Partially Met      OT LONG TERM GOAL #2   Title  Patient will complete donning long sleeve shirt with modified independence.    Baseline  requires assist with long sleeves, can perform short sleeves.    Time  12    Period  Weeks    Status  Achieved      OT LONG TERM GOAL #3   Title  Patient will improve coordination to manage buttons on clothing with occasional assistance only.    Baseline  requires assist with buttons each time    Time  12    Period  Weeks    Status  Partially Met      OT LONG TERM GOAL #4   Title  Patient will complete managing pants after toileting with modified independence.     Baseline  assist most days     Time  12    Period  Weeks    Status  Achieved      OT LONG TERM GOAL #5   Title  Patient will demonstrate turning behaviors with rollator using large steps and keeping feet behind the rollator with all turns with occasional minimal cues.     Time  12    Period  Weeks    Status  New      OT LONG TERM GOAL #6   Title  Patient will improve gait speed and endurance and be able to walk 1050 feet in 6 minutes to negotiate around the home and community safely in 4 weeks    Baseline  4270 at recert 03/25/36    Time  4    Period  Weeks    Status  Revised      OT LONG TERM GOAL #7   Title  Patient will complete HEP for maximal daily exercises with modified independence in 4 weeks    Time  12    Period  Weeks    Status  Partially Met      OT LONG TERM GOAL #8   Title  Patient will transfer from sit to stand without the use of  arms safely and independently from a variety of  chairs/surfaces in 4 weeks.     Baseline  difficulty from lower surfaces    Time  4    Period  Weeks    Status  Achieved      OT LONG TERM GOAL  #9   Baseline  Patient will demonstrate decreased episodes of freezing of behaviors from score of 16 to score of less than 12.    Time  4    Period  Weeks    Status  Achieved            Plan - 11/11/17 1550    Clinical Impression Statement  Patient concerned about increased frequency of hesistations during mobility at home and feels episodes are occuring at random times, he originally thought it was more in the night and early morning, unsure now.  Continued to work towards improving balance, posture and turning behaviors which all affect freezing of gait behaviors.      Occupational Profile and client history currently impacting functional performance  progressive disease process, freezing of gait behaviors, fall risk    Occupational performance deficits (Please refer to evaluation for details):  ADL's;IADL's;Leisure    Rehab Potential  Good    Current Impairments/barriers affecting progress:  positive: family support, negative: level of motivation, progressive disease    OT Frequency  2x / week    OT Duration  12 weeks    OT Treatment/Interventions  Self-care/ADL training;Moist Heat;DME and/or AE instruction;Balance training;Therapeutic activities;Therapeutic exercise;Neuromuscular education;Functional Mobility Training;Patient/family education    Consulted and Agree with Plan of Care  Patient       Patient will benefit from skilled therapeutic intervention in order to improve the following deficits and impairments:  Abnormal gait, Decreased coordination, Decreased range of motion, Difficulty walking, Decreased endurance, Decreased safety awareness, Decreased activity tolerance, Decreased balance, Impaired UE functional use, Pain, Decreased mobility, Decreased strength  Visit Diagnosis: Difficulty in walking, not elsewhere  classified  Muscle weakness (generalized)  Unsteadiness on feet  Abnormal posture  Other lack of coordination    Problem List Patient Active Problem List   Diagnosis Date Noted  . BPH with urinary obstruction 11/14/2016  . Urinary frequency 10/10/2016  . Microscopic hematuria 10/10/2016  . Parkinson's syndrome ([place]) 06/14/2016  . Arthritis 06/14/2016  . Depression 06/14/2016  . Heartburn 06/14/2016  . Urinary incontinence 06/14/2016  . Skin lesion 06/14/2016  . Dementia 06/14/2016  . Bilateral edema of lower extremity 06/14/2016   Achilles Dunk, OTR/L, CLT  Kristianna Saperstein 11/11/2017, 3:53 PM  [place] MAIN [facility] SERVICES [address] [place], [place], [address] Phone: [phone]   Fax:  [phone]  Name: Luke Durham MRN: [id number] Date of Birth: August 07, 1933

## 2017-11-11 NOTE — Therapy (Signed)
[place] MAIN [facility] SERVICES [address] [place], [place], [address] Phone: [phone]   Fax:  [phone]  Occupational Therapy Treatment  Patient Details  Name: Luke Durham MRN: [id number] Date of Birth: 1933-10-22 No Data Recorded  Encounter Date: 11/03/2017  OT End of Session - 11/11/17 1622    Visit Number  80    Number of Visits  95    Date for OT Re-Evaluation  11/10/17    OT Start Time  1015    OT Stop Time  1115    OT Time Calculation (min)  60 min    Activity Tolerance  Patient tolerated treatment well    Behavior During Therapy  [facility] for tasks assessed/performed       Past Medical History:  Diagnosis Date  . Anxiety   . Arthritis   . Asthma    childhood asthma  . Cancer ([place]) 7 years ago   lymphoma, [place] (recent)  . Chronic kidney disease   . Colon polyps   . GERD (gastroesophageal reflux disease)   . History of kidney stones   . Parkinson disease [facility])     Past Surgical History:  Procedure Laterality Date  . COLON SURGERY    . CYSTOSCOPY WITH LITHOLAPAXY N/A 11/14/2016   Procedure: CYSTOSCOPY WITH LITHOLAPAXY;  Surgeon: Hollice Espy, MD;  Location: [facility] ORS;  Service: Urology;  Laterality: N/A;  . EYE SURGERY Bilateral    Cataract Extraction with IOL  . HERNIA REPAIR  20 years  . MOHS SURGERY    . SMALL INTESTINE SURGERY     Per patient 7 years  . TRANSURETHRAL RESECTION OF PROSTATE N/A 11/14/2016   Procedure: TRANSURETHRAL RESECTION OF THE PROSTATE (TURP);  Surgeon: Hollice Espy, MD;  Location: [facility] ORS;  Service: Urology;  Laterality: N/A;    There were no vitals filed for this visit.  Subjective Assessment - 11/11/17 1612    Subjective   Patient reports no changes this week, feels really sleepy all day and has difficulty staying awake, he attributes this to medication.     Pertinent History  Patient reports he was diagnosed with Parkinson's disease about 10 plus years ago.  He reports a more recent  decline in function in his daily activities in the last 6-12 months.  He has been seeing PT for the last couple months.    Patient Stated Goals  Patient reports he wants to be able to do more for himself and around the house.     Currently in Pain?  Yes    Pain Score  3     Pain Location  Shoulder    Pain Orientation  Right    Pain Descriptors / Indicators  Aching                   OT Treatments/Exercises (OP) - 11/11/17 1614      ADLs   ADL Comments  continued focus on turning in small spaces, managing crowds such as in the cafeteria and getting on and off the elevator. Cues for technique and positioning of self in relation to walker.       Neurological Re-education Exercises   Other Exercises 1  Patient seen for instruction of LSVT BIG exercises: LSVT Daily Session Maximal Daily Exercises: Sustained movements are designed to rescale the amplitude of movement output for generalization to daily functional activities. Performed as follows for 1 set of 10 repetitions each: Multi directional sustained movements- 1) Floor to ceiling, 2)  Side to side. Multi directional Repetitive movements performed in standing and are designed to provide retraining effort needed for sustained muscle activation in tasks Performed as follows: 3) Step and reach forward, 4) Step and Reach Backwards, 5) Step and reach sideways, 6) Rock and reach forward/backward, 7) Rock and reach sideways. Exercises performed in standard version without the use of a chair with min assist to min guard from therapist and verbal cues.    Other Exercises 2  one loss of balance this date with stepping exercises on left side, therapist assistance to recover.  Patient demonstrating hesitation of 1-3 secs with delayed reactive response.              OT Education - 11/11/17 1622    Education provided  Yes    Education Details  freezing of gait, rollator use    Person(s) Educated  Patient    Methods   Explanation;Demonstration;Verbal cues    Comprehension  Verbal cues required;Returned demonstration;Verbalized understanding          OT Long Term Goals - 09/16/17 1308      OT LONG TERM GOAL #1   Title  Patient will demonstrate increase right hand grip to be able to cut food with modified independence.     Baseline  occasional assist required.    Time  12    Period  Weeks    Status  Partially Met      OT LONG TERM GOAL #2   Title  Patient will complete donning long sleeve shirt with modified independence.    Baseline  requires assist with long sleeves, can perform short sleeves.    Time  12    Period  Weeks    Status  Achieved      OT LONG TERM GOAL #3   Title  Patient will improve coordination to manage buttons on clothing with occasional assistance only.    Baseline  requires assist with buttons each time    Time  12    Period  Weeks    Status  Partially Met      OT LONG TERM GOAL #4   Title  Patient will complete managing pants after toileting with modified independence.     Baseline  assist most days     Time  12    Period  Weeks    Status  Achieved      OT LONG TERM GOAL #5   Title  Patient will demonstrate turning behaviors with rollator using large steps and keeping feet behind the rollator with all turns with occasional minimal cues.     Time  12    Period  Weeks    Status  New      OT LONG TERM GOAL #6   Title  Patient will improve gait speed and endurance and be able to walk 1050 feet in 6 minutes to negotiate around the home and community safely in 4 weeks    Baseline  2505 at recert 12/31/74    Time  4    Period  Weeks    Status  Revised      OT LONG TERM GOAL #7   Title  Patient will complete HEP for maximal daily exercises with modified independence in 4 weeks    Time  12    Period  Weeks    Status  Partially Met      OT LONG TERM GOAL #8   Title  Patient will transfer from sit to  stand without the use of arms safely and independently from a  variety of chairs/surfaces in 4 weeks.     Baseline  difficulty from lower surfaces    Time  4    Period  Weeks    Status  Achieved      OT LONG TERM GOAL  #9   Baseline  Patient will demonstrate decreased episodes of freezing of behaviors from score of 16 to score of less than 12.    Time  4    Period  Weeks    Status  Achieved            Plan - 11/11/17 1623    Clinical Impression Statement  Patient denies any falls over the last week but has had some freezing of gait at home. Continued work on amplitude of gait with functional mobility, larger steps with turns and stepping patterns to position self behind the rollator for safety.     Occupational Profile and client history currently impacting functional performance  progressive disease process, freezing of gait behaviors, fall risk    Occupational performance deficits (Please refer to evaluation for details):  ADL's;IADL's;Leisure    Rehab Potential  Good    Current Impairments/barriers affecting progress:  positive: family support, negative: level of motivation, progressive disease    OT Frequency  2x / week    OT Duration  12 weeks    OT Treatment/Interventions  Self-care/ADL training;Moist Heat;DME and/or AE instruction;Balance training;Therapeutic activities;Therapeutic exercise;Neuromuscular education;Functional Mobility Training;Patient/family education    Consulted and Agree with Plan of Care  Patient       Patient will benefit from skilled therapeutic intervention in order to improve the following deficits and impairments:  Abnormal gait, Decreased coordination, Decreased range of motion, Difficulty walking, Decreased endurance, Decreased safety awareness, Decreased activity tolerance, Decreased balance, Impaired UE functional use, Pain, Decreased mobility, Decreased strength  Visit Diagnosis: Difficulty in walking, not elsewhere classified  Muscle weakness (generalized)  Unsteadiness on feet  Abnormal  posture  Other lack of coordination    Problem List Patient Active Problem List   Diagnosis Date Noted  . BPH with urinary obstruction 11/14/2016  . Urinary frequency 10/10/2016  . Microscopic hematuria 10/10/2016  . Parkinson's syndrome ([place]) 06/14/2016  . Arthritis 06/14/2016  . Depression 06/14/2016  . Heartburn 06/14/2016  . Urinary incontinence 06/14/2016  . Skin lesion 06/14/2016  . Dementia 06/14/2016  . Bilateral edema of lower extremity 06/14/2016   Achilles Dunk, OTR/L, CLT  Lovett,Amy 11/11/2017, 4:26 PM  [place] MAIN [facility] SERVICES [address] [place], [place], [address] Phone: [phone]   Fax:  [phone]  Name: Altan Kraai MRN: [id number] Date of Birth: [birth year]

## 2017-11-13 ENCOUNTER — Ambulatory Visit: Payer: Medicare Other | Admitting: Occupational Therapy

## 2017-11-13 DIAGNOSIS — R262 Difficulty in walking, not elsewhere classified: Secondary | ICD-10-CM

## 2017-11-13 DIAGNOSIS — G8929 Other chronic pain: Secondary | ICD-10-CM

## 2017-11-13 DIAGNOSIS — R293 Abnormal posture: Secondary | ICD-10-CM

## 2017-11-13 DIAGNOSIS — R278 Other lack of coordination: Secondary | ICD-10-CM

## 2017-11-13 DIAGNOSIS — M6281 Muscle weakness (generalized): Secondary | ICD-10-CM

## 2017-11-13 DIAGNOSIS — R2681 Unsteadiness on feet: Secondary | ICD-10-CM

## 2017-11-13 DIAGNOSIS — M25511 Pain in right shoulder: Secondary | ICD-10-CM

## 2017-11-17 ENCOUNTER — Ambulatory Visit: Payer: Medicare Other | Admitting: Occupational Therapy

## 2017-11-17 DIAGNOSIS — G8929 Other chronic pain: Secondary | ICD-10-CM

## 2017-11-17 DIAGNOSIS — R293 Abnormal posture: Secondary | ICD-10-CM

## 2017-11-17 DIAGNOSIS — M25511 Pain in right shoulder: Secondary | ICD-10-CM

## 2017-11-17 DIAGNOSIS — M6281 Muscle weakness (generalized): Secondary | ICD-10-CM

## 2017-11-17 DIAGNOSIS — R262 Difficulty in walking, not elsewhere classified: Secondary | ICD-10-CM | POA: Diagnosis not present

## 2017-11-17 DIAGNOSIS — R2681 Unsteadiness on feet: Secondary | ICD-10-CM

## 2017-11-17 DIAGNOSIS — R278 Other lack of coordination: Secondary | ICD-10-CM

## 2017-11-21 ENCOUNTER — Ambulatory Visit: Payer: Medicare Other | Admitting: Occupational Therapy

## 2017-11-21 DIAGNOSIS — R278 Other lack of coordination: Secondary | ICD-10-CM

## 2017-11-21 DIAGNOSIS — M6281 Muscle weakness (generalized): Secondary | ICD-10-CM

## 2017-11-21 DIAGNOSIS — R293 Abnormal posture: Secondary | ICD-10-CM

## 2017-11-21 DIAGNOSIS — M25511 Pain in right shoulder: Secondary | ICD-10-CM

## 2017-11-21 DIAGNOSIS — R2681 Unsteadiness on feet: Secondary | ICD-10-CM

## 2017-11-21 DIAGNOSIS — R262 Difficulty in walking, not elsewhere classified: Secondary | ICD-10-CM

## 2017-11-21 DIAGNOSIS — G8929 Other chronic pain: Secondary | ICD-10-CM

## 2017-11-22 ENCOUNTER — Encounter: Payer: Self-pay | Admitting: Occupational Therapy

## 2017-11-23 ENCOUNTER — Encounter: Payer: Self-pay | Admitting: Occupational Therapy

## 2017-11-23 NOTE — Therapy (Signed)
[place] MAIN [facility] SERVICES [address] [place], [place], [address] Phone: [phone]   Fax:  [phone]  Occupational Therapy Treatment  Patient Details  Name: Luke Durham MRN: [id number] Date of Birth: [birth year] No Data Recorded  Encounter Date: 11/17/2017  OT End of Session - 11/23/17 2014    Visit Number  87    Number of Visits  108    Date for OT Re-Evaluation  02/02/18    OT Start Time  1100    OT Stop Time  1201    OT Time Calculation (min)  61 min    Activity Tolerance  Patient tolerated treatment well    Behavior During Therapy  [facility] for tasks assessed/performed       Past Medical History:  Diagnosis Date  . Anxiety   . Arthritis   . Asthma    childhood asthma  . Cancer ([place]) 7 years ago   lymphoma, [place] (recent)  . Chronic kidney disease   . Colon polyps   . GERD (gastroesophageal reflux disease)   . History of kidney stones   . Parkinson disease [facility])     Past Surgical History:  Procedure Laterality Date  . COLON SURGERY    . CYSTOSCOPY WITH LITHOLAPAXY N/A 11/14/2016   Procedure: CYSTOSCOPY WITH LITHOLAPAXY;  Surgeon: Hollice Espy, MD;  Location: [facility] ORS;  Service: Urology;  Laterality: N/A;  . EYE SURGERY Bilateral    Cataract Extraction with IOL  . HERNIA REPAIR  20 years  . MOHS SURGERY    . SMALL INTESTINE SURGERY     Per patient 7 years  . TRANSURETHRAL RESECTION OF PROSTATE N/A 11/14/2016   Procedure: TRANSURETHRAL RESECTION OF THE PROSTATE (TURP);  Surgeon: Hollice Espy, MD;  Location: [facility] ORS;  Service: Urology;  Laterality: N/A;    There were no vitals filed for this visit.  Subjective Assessment - 11/23/17 2010    Subjective   Patient reports he had a fall this morning when getting up to go to the bathroom, unsure of what happened he just recalls hitting the wall and has several abrasions on his head.  He denies any other injury and reports he feels normal.     Patient is accompained  by:  Interpreter    Pertinent History  Patient reports he was diagnosed with Parkinson's disease about 10 plus years ago.  He reports a more recent decline in function in his daily activities in the last 6-12 months.  He has been seeing PT for the last couple months.    Patient Stated Goals  Patient reports he wants to be able to do more for himself and around the house.     Currently in Pain?  No/denies    Pain Score  0-No pain    Multiple Pain Sites  No                   OT Treatments/Exercises (OP) - 11/23/17 2011      ADLs   ADL Comments  Patient seen for transfers related to toileting, getting out of bed and using rollator in small spaces.  Cues for turning, positioning of self       Neurological Re-education Exercises   Other Exercises 1  Patient seen for instruction of LSVT BIG exercises: LSVT Daily Session Maximal Daily Exercises: Sustained movements are designed to rescale the amplitude of movement output for generalization to daily functional activities. Performed as follows for 1 set of 10 repetitions each:  Multi directional sustained movements- 1) Floor to ceiling, 2) Side to side. Multi directional Repetitive movements performed in standing and are designed to provide retraining effort needed for sustained muscle activation in tasks Performed as follows: 3) Step and reach forward, 4) Step and Reach Backwards, 5) Step and reach sideways, 6) Rock and reach forward/backward, 7) Rock and reach sideways. Exercises performed in standard version without the use of a chair with min assist to min guard from therapist and verbal cues.    Other Exercises 2  Functional mobility for 2 trials of 300 feet with cues for amplitude of gait, turns and navigating in crowded areas with use of rollator.              OT Education - 11/23/17 2013    Education provided  Yes    Education Details  safety, rollator use    Person(s) Educated  Patient    Methods   Explanation;Demonstration;Verbal cues    Comprehension  Verbal cues required;Returned demonstration;Verbalized understanding          OT Long Term Goals - 11/23/17 1831      OT LONG TERM GOAL #1   Title  Patient will demonstrate increase right hand grip to be able to cut food with modified independence.     Baseline  occasional assist required.    Time  12    Period  Weeks    Status  Partially Met      OT LONG TERM GOAL #2   Title  Patient will complete donning long sleeve shirt with modified independence.    Baseline  requires assist with long sleeves, can perform short sleeves.    Time  12    Period  Weeks    Status  Achieved      OT LONG TERM GOAL #3   Title  Patient will improve coordination to manage buttons on clothing with occasional assistance only.    Baseline  requires assist with buttons each time    Time  12    Period  Weeks    Status  Partially Met      OT LONG TERM GOAL #4   Title  Patient will complete managing pants after toileting with modified independence.     Baseline  assist most days     Time  12    Period  Weeks    Status  Achieved      OT LONG TERM GOAL #5   Title  Patient will demonstrate turning behaviors with rollator using large steps and keeping feet behind the rollator with all turns with occasional minimal cues.     Time  12    Period  Weeks    Status  On-going      OT LONG TERM GOAL #6   Title  Patient will improve gait speed and endurance and be able to walk 950 feet in 6 minutes to negotiate around the home and community safely in 4 weeks    Baseline  860 at reassessment    Time  4    Period  Weeks    Status  Revised      OT LONG TERM GOAL #7   Title  Patient will complete HEP for maximal daily exercises with modified independence in 4 weeks    Time  12    Period  Weeks    Status  Partially Met      OT LONG TERM GOAL #8   Title  Patient will transfer  from sit to stand without the use of arms safely and independently from a  variety of chairs/surfaces in 4 weeks.     Baseline  difficulty from lower surfaces    Time  4    Period  Weeks    Status  Achieved      OT LONG TERM GOAL  #9   Baseline  Patient will demonstrate decreased episodes of freezing of behaviors from score of 16 to score of less than 12.    Time  4    Period  Weeks    Status  Achieved            Plan - 11/23/17 2014    Clinical Impression Statement  Patient's blood pressure 94/60 this date, reports fall last night when getting up to go to the bathroom, he again reports not being aware of what happened and finds himself falling into the wall.  This has happened in the past, he reports having his rollator next to the bed.  It is unsure if he is fully awake prior to getting out of bed.  Patient has abrasions to head but denies any pain, injury or changes in cognition.  Continue to work on reducing risk of falls, may need to analyze environmental factors at home since he tends to fall more at night when getting up.  Also BP has been in lower ranges and patient has not taken BP when having one of these episodes.     Occupational Profile and client history currently impacting functional performance  progressive disease process, freezing of gait behaviors, fall risk    Occupational performance deficits (Please refer to evaluation for details):  ADL's;IADL's;Leisure    Rehab Potential  Good    Current Impairments/barriers affecting progress:  positive: family support, negative: level of motivation, progressive disease    OT Frequency  2x / week    OT Duration  12 weeks    OT Treatment/Interventions  Self-care/ADL training;Moist Heat;DME and/or AE instruction;Balance training;Therapeutic activities;Therapeutic exercise;Neuromuscular education;Functional Mobility Training;Patient/family education    Consulted and Agree with Plan of Care  Patient    Family Member Consulted  wife       Patient will benefit from skilled therapeutic intervention in  order to improve the following deficits and impairments:  Abnormal gait, Decreased coordination, Decreased range of motion, Difficulty walking, Decreased endurance, Decreased safety awareness, Decreased activity tolerance, Decreased balance, Impaired UE functional use, Pain, Decreased mobility, Decreased strength  Visit Diagnosis: Difficulty in walking, not elsewhere classified  Unsteadiness on feet  Abnormal posture  Other lack of coordination  Chronic right shoulder pain  Muscle weakness (generalized)    Problem List Patient Active Problem List   Diagnosis Date Noted  . BPH with urinary obstruction 11/14/2016  . Urinary frequency 10/10/2016  . Microscopic hematuria 10/10/2016  . Parkinson's syndrome ([place]) 06/14/2016  . Arthritis 06/14/2016  . Depression 06/14/2016  . Heartburn 06/14/2016  . Urinary incontinence 06/14/2016  . Skin lesion 06/14/2016  . Dementia 06/14/2016  . Bilateral edema of lower extremity 06/14/2016   Achilles Dunk, OTR/L, CLT  Lovett,Amy 11/23/2017, 8:19 PM  [place] MAIN [facility] SERVICES [address] [place], [place], [address] Phone: [phone]   Fax:  [phone]  Name: Luke Durham MRN: [id number] Date of Birth: 1933/05/07

## 2017-11-23 NOTE — Therapy (Signed)
[place] MAIN [facility] SERVICES [address] [place], [place], [address] Phone: [phone]   Fax:  [phone]  Occupational Therapy Treatment  Patient Details  Name: Luke Durham MRN: [id number] Date of Birth: 1933/03/02 No Data Recorded  Encounter Date: 11/13/2017  OT End of Session - 11/23/17 1822    Visit Number  83    Number of Visits  108    Date for OT Re-Evaluation  02/02/18    OT Start Time  1300    OT Stop Time  1405    OT Time Calculation (min)  65 min    Activity Tolerance  Patient tolerated treatment well    Behavior During Therapy  [facility] for tasks assessed/performed       Past Medical History:  Diagnosis Date  . Anxiety   . Arthritis   . Asthma    childhood asthma  . Cancer ([place]) 7 years ago   lymphoma, [place] (recent)  . Chronic kidney disease   . Colon polyps   . GERD (gastroesophageal reflux disease)   . History of kidney stones   . Parkinson disease [facility])     Past Surgical History:  Procedure Laterality Date  . COLON SURGERY    . CYSTOSCOPY WITH LITHOLAPAXY N/A 11/14/2016   Procedure: CYSTOSCOPY WITH LITHOLAPAXY;  Surgeon: Hollice Espy, MD;  Location: [facility] ORS;  Service: Urology;  Laterality: N/A;  . EYE SURGERY Bilateral    Cataract Extraction with IOL  . HERNIA REPAIR  20 years  . MOHS SURGERY    . SMALL INTESTINE SURGERY     Per patient 7 years  . TRANSURETHRAL RESECTION OF PROSTATE N/A 11/14/2016   Procedure: TRANSURETHRAL RESECTION OF THE PROSTATE (TURP);  Surgeon: Hollice Espy, MD;  Location: [facility] ORS;  Service: Urology;  Laterality: N/A;    There were no vitals filed for this visit.  Subjective Assessment - 11/22/17 2117    Subjective   Patient reports he has been doing well, no complaints.  Discussed reassessment and need for continuing therapy, patient feels he continues to need therapy, when he has taken short breaks in the past it has resulted in decline in function.      Pertinent History   Patient reports he was diagnosed with Parkinson's disease about 10 plus years ago.  He reports a more recent decline in function in his daily activities in the last 6-12 months.  He has been seeing PT for the last couple months.    Patient Stated Goals  Patient reports he wants to be able to do more for himself and around the house.     Currently in Pain?  Yes    Pain Score  3     Pain Location  Shoulder    Pain Orientation  Right    Pain Descriptors / Indicators  Aching    Pain Type  Chronic pain    Pain Onset  More than a month ago    Pain Frequency  Intermittent    Multiple Pain Sites  No                   OT Treatments/Exercises (OP) - 11/23/17 1815      ADLs   ADL Comments  Patient seen for ADL reassessment, patient now able to don his socks better with only occasional assistance required if socks are new or tight in nature.  Patient is performing most self care tasks without assistance at home.  He requires some assist  when out in the community to get in and out of the bathrooms with rollator.  Slow to complete all tasks and still reports some spillage when self feeding. He is reporting improved performance with moving in smaller and tighter places, he is falling asleep easier and more consistent with his exercises.  He has had a small number of falls in the last 6 months, often when getting up at night to toilet.  He reports freezing of gait at times and feels it is more evident at night and early in the am.        Neurological Re-education Exercises   Other Exercises 1  Patient seen for maximal daily exercises as outlined in previous sessions, he is working towards performing in a standard version without the use of a chair for balance.  He occasionally requires the chair next to him when adjusting positions.  When he has occasional periods of hesitation of freezing, he is more at risk for falls.  He is working towards strategies to minimize effects of freezing of gait and can  often correct balance with cues.  BP was 92/57 this date.              OT Education - 11/23/17 1822    Education provided  Yes    Education Details  POC, goals, Psychiatric nurse) Educated  Patient    Methods  Explanation;Demonstration;Verbal cues    Comprehension  Verbal cues required;Returned demonstration;Verbalized understanding          OT Long Term Goals - 11/23/17 1831      OT LONG TERM GOAL #1   Title  Patient will demonstrate increase right hand grip to be able to cut food with modified independence.     Baseline  occasional assist required.    Time  12    Period  Weeks    Status  Partially Met      OT LONG TERM GOAL #2   Title  Patient will complete donning long sleeve shirt with modified independence.    Baseline  requires assist with long sleeves, can perform short sleeves.    Time  12    Period  Weeks    Status  Achieved      OT LONG TERM GOAL #3   Title  Patient will improve coordination to manage buttons on clothing with occasional assistance only.    Baseline  requires assist with buttons each time    Time  12    Period  Weeks    Status  Partially Met      OT LONG TERM GOAL #4   Title  Patient will complete managing pants after toileting with modified independence.     Baseline  assist most days     Time  12    Period  Weeks    Status  Achieved      OT LONG TERM GOAL #5   Title  Patient will demonstrate turning behaviors with rollator using large steps and keeping feet behind the rollator with all turns with occasional minimal cues.     Time  12    Period  Weeks    Status  On-going      OT LONG TERM GOAL #6   Title  Patient will improve gait speed and endurance and be able to walk 950 feet in 6 minutes to negotiate around the home and community safely in 4 weeks    Baseline  860 at reassessment  Time  4    Period  Weeks    Status  Revised      OT LONG TERM GOAL #7   Title  Patient will complete HEP for maximal daily exercises with  modified independence in 4 weeks    Time  12    Period  Weeks    Status  Partially Met      OT LONG TERM GOAL #8   Title  Patient will transfer from sit to stand without the use of arms safely and independently from a variety of chairs/surfaces in 4 weeks.     Baseline  difficulty from lower surfaces    Time  4    Period  Weeks    Status  Achieved      OT LONG TERM GOAL  #9   Baseline  Patient will demonstrate decreased episodes of freezing of behaviors from score of 16 to score of less than 12.    Time  4    Period  Weeks    Status  Achieved       6 minute walk test 860 feet. Right grip strength 44#, left 46#     Plan - 11/22/17 2119    Clinical Impression Statement  Patient seen for reassessment this date for updating goals and plan of care. Patient demonstrates improved performance with use of new mobility device, rollator at home and in the community.  He feels he is getting better at managing in smaller spaces, able to get his socks on more consistently, sleeping better, consistent with exercises.  He reports he feels cold all the time and feels freezing of gait is worse at night and in the am.  He has had a couple of falls in the last few months and usually associated with getting up at night for toileting. Many of these times he did not use his assistive device and often without an assistive device he tends to freeze more.  He also has had a lower blood pressure in the clinic with today 92/57, he is unsure of what his blood pressure is as he gets up in the night but has reported feelings of grogginess and being unaware of falls until after they occur (does not always recall getting up).  Advised patient to keep a check on his blood pressure and follow up with MD.  Patient continues to benefit from skilled OT to maximize his safety and independence at home and in the community and has shown signs of decline in function when he does not attend therapy.      Occupational Profile and  client history currently impacting functional performance  progressive disease process, freezing of gait behaviors, fall risk    Occupational performance deficits (Please refer to evaluation for details):  ADL's;IADL's;Leisure    Rehab Potential  Good    Current Impairments/barriers affecting progress:  positive: family support, negative: level of motivation, progressive disease    OT Frequency  2x / week    OT Duration  12 weeks    OT Treatment/Interventions  Self-care/ADL training;Moist Heat;DME and/or AE instruction;Balance training;Therapeutic activities;Therapeutic exercise;Neuromuscular education;Functional Mobility Training;Patient/family education    Consulted and Agree with Plan of Care  Patient       Patient will benefit from skilled therapeutic intervention in order to improve the following deficits and impairments:  Abnormal gait, Decreased coordination, Decreased range of motion, Difficulty walking, Decreased endurance, Decreased safety awareness, Decreased activity tolerance, Decreased balance, Impaired UE functional use, Pain, Decreased mobility, Decreased strength  Visit Diagnosis: Difficulty in walking, not elsewhere classified  Unsteadiness on feet  Abnormal posture  Other lack of coordination  Chronic right shoulder pain  Muscle weakness (generalized)    Problem List Patient Active Problem List   Diagnosis Date Noted  . BPH with urinary obstruction 11/14/2016  . Urinary frequency 10/10/2016  . Microscopic hematuria 10/10/2016  . Parkinson's syndrome ([place]) 06/14/2016  . Arthritis 06/14/2016  . Depression 06/14/2016  . Heartburn 06/14/2016  . Urinary incontinence 06/14/2016  . Skin lesion 06/14/2016  . Dementia 06/14/2016  . Bilateral edema of lower extremity 06/14/2016   Achilles Dunk, OTR/L, CLT  Lovett,Amy 11/23/2017, 6:33 PM  [place] MAIN [facility] SERVICES [address] [place], [place], [address] Phone:  [phone]   Fax:  [phone]  Name: Prakash Kimberling MRN: [id number] Date of Birth: 18-Oct-1933

## 2017-11-23 NOTE — Therapy (Signed)
[place] MAIN [facility] SERVICES [address] [place], [place], [address] Phone: [phone]   Fax:  [phone]  Occupational Therapy Treatment  Patient Details  Name: Luke Durham MRN: [id number] Date of Birth: 23-Aug-1933 No Data Recorded  Encounter Date: 11/21/2017  OT End of Session - 11/23/17 2035    Visit Number  37    Number of Visits  108    Date for OT Re-Evaluation  02/02/18    OT Start Time  1300    OT Stop Time  1400    OT Time Calculation (min)  60 min    Activity Tolerance  Patient tolerated treatment well    Behavior During Therapy  [facility] for tasks assessed/performed       Past Medical History:  Diagnosis Date  . Anxiety   . Arthritis   . Asthma    childhood asthma  . Cancer ([place]) 7 years ago   lymphoma, [place] (recent)  . Chronic kidney disease   . Colon polyps   . GERD (gastroesophageal reflux disease)   . History of kidney stones   . Parkinson disease [facility])     Past Surgical History:  Procedure Laterality Date  . COLON SURGERY    . CYSTOSCOPY WITH LITHOLAPAXY N/A 11/14/2016   Procedure: CYSTOSCOPY WITH LITHOLAPAXY;  Surgeon: Hollice Espy, MD;  Location: [facility] ORS;  Service: Urology;  Laterality: N/A;  . EYE SURGERY Bilateral    Cataract Extraction with IOL  . HERNIA REPAIR  20 years  . MOHS SURGERY    . SMALL INTESTINE SURGERY     Per patient 7 years  . TRANSURETHRAL RESECTION OF PROSTATE N/A 11/14/2016   Procedure: TRANSURETHRAL RESECTION OF THE PROSTATE (TURP);  Surgeon: Hollice Espy, MD;  Location: [facility] ORS;  Service: Urology;  Laterality: N/A;    There were no vitals filed for this visit.  Subjective Assessment - 11/23/17 2031    Subjective   Patient reports he is feeling better, abrasions healing and has a bruise on right side of head.  Denies any further falls.     Pertinent History  Patient reports he was diagnosed with Parkinson's disease about 10 plus years ago.  He reports a more recent decline  in function in his daily activities in the last 6-12 months.  He has been seeing PT for the last couple months.    Patient Stated Goals  Patient reports he wants to be able to do more for himself and around the house.     Currently in Pain?  No/denies    Pain Score  0-No pain                   OT Treatments/Exercises (OP) - 11/23/17 2032      Neurological Re-education Exercises   Other Exercises 1  Patient seen for instruction of LSVT BIG exercises: LSVT Daily Session Maximal Daily Exercises: Sustained movements are designed to rescale the amplitude of movement output for generalization to daily functional activities. Performed as follows for 1 set of 10 repetitions each: Multi directional sustained movements- 1) Floor to ceiling, 2) Side to side. Multi directional Repetitive movements performed in standing and are designed to provide retraining effort needed for sustained muscle activation in tasks Performed as follows: 3) Step and reach forward, 4) Step and Reach Backwards, 5) Step and reach sideways, 6) Rock and reach forward/backward, 7) Rock and reach sideways. Exercises performed in standard version without the use of a chair with min assist  to min guard from therapist and verbal cues.     Other Exercises 2  Patient seen for functional mobility skills with rollator with cues for size of stepping patterns, turning behaviors and positioning of body for safety with mobility.   Patient seen for focus on reciprocal stepping patterns, balance with transitional movements and posture.              OT Education - 11/23/17 2034    Education provided  Yes    Education Details  maximal daily exercises, balance    Person(s) Educated  Patient    Methods  Explanation;Demonstration;Verbal cues    Comprehension  Returned demonstration;Verbal cues required;Verbalized understanding          OT Long Term Goals - 11/23/17 1831      OT LONG TERM GOAL #1   Title  Patient will  demonstrate increase right hand grip to be able to cut food with modified independence.     Baseline  occasional assist required.    Time  12    Period  Weeks    Status  Partially Met      OT LONG TERM GOAL #2   Title  Patient will complete donning long sleeve shirt with modified independence.    Baseline  requires assist with long sleeves, can perform short sleeves.    Time  12    Period  Weeks    Status  Achieved      OT LONG TERM GOAL #3   Title  Patient will improve coordination to manage buttons on clothing with occasional assistance only.    Baseline  requires assist with buttons each time    Time  12    Period  Weeks    Status  Partially Met      OT LONG TERM GOAL #4   Title  Patient will complete managing pants after toileting with modified independence.     Baseline  assist most days     Time  12    Period  Weeks    Status  Achieved      OT LONG TERM GOAL #5   Title  Patient will demonstrate turning behaviors with rollator using large steps and keeping feet behind the rollator with all turns with occasional minimal cues.     Time  12    Period  Weeks    Status  On-going      OT LONG TERM GOAL #6   Title  Patient will improve gait speed and endurance and be able to walk 950 feet in 6 minutes to negotiate around the home and community safely in 4 weeks    Baseline  860 at reassessment    Time  4    Period  Weeks    Status  Revised      OT LONG TERM GOAL #7   Title  Patient will complete HEP for maximal daily exercises with modified independence in 4 weeks    Time  12    Period  Weeks    Status  Partially Met      OT LONG TERM GOAL #8   Title  Patient will transfer from sit to stand without the use of arms safely and independently from a variety of chairs/surfaces in 4 weeks.     Baseline  difficulty from lower surfaces    Time  4    Period  Weeks    Status  Achieved      OT LONG TERM  GOAL  #9   Baseline  Patient will demonstrate decreased episodes of  freezing of behaviors from score of 16 to score of less than 12.    Time  4    Period  Weeks    Status  Achieved            Plan - 11/23/17 2035    Clinical Impression Statement  BP  102/62 this date.  Patient continues to monitor BP each session and at home with similar readings. Patient with a fall last week but no falls since.  He continues to work towards functional mobility, minimizing freezing of gait behaviors and balance with daily tasks.     Occupational Profile and client history currently impacting functional performance  progressive disease process, freezing of gait behaviors, fall risk    Occupational performance deficits (Please refer to evaluation for details):  ADL's;IADL's;Leisure    Rehab Potential  Good    Current Impairments/barriers affecting progress:  positive: family support, negative: level of motivation, progressive disease    OT Frequency  2x / week    OT Duration  12 weeks    OT Treatment/Interventions  Self-care/ADL training;Moist Heat;DME and/or AE instruction;Balance training;Therapeutic activities;Therapeutic exercise;Neuromuscular education;Functional Mobility Training;Patient/family education    Consulted and Agree with Plan of Care  Patient    Family Member Consulted  wife       Patient will benefit from skilled therapeutic intervention in order to improve the following deficits and impairments:  Abnormal gait, Decreased coordination, Decreased range of motion, Difficulty walking, Decreased endurance, Decreased safety awareness, Decreased activity tolerance, Decreased balance, Impaired UE functional use, Pain, Decreased mobility, Decreased strength  Visit Diagnosis: Difficulty in walking, not elsewhere classified  Unsteadiness on feet  Abnormal posture  Other lack of coordination  Chronic right shoulder pain  Muscle weakness (generalized)    Problem List Patient Active Problem List   Diagnosis Date Noted  . BPH with urinary obstruction  11/14/2016  . Urinary frequency 10/10/2016  . Microscopic hematuria 10/10/2016  . Parkinson's syndrome ([place]) 06/14/2016  . Arthritis 06/14/2016  . Depression 06/14/2016  . Heartburn 06/14/2016  . Urinary incontinence 06/14/2016  . Skin lesion 06/14/2016  . Dementia 06/14/2016  . Bilateral edema of lower extremity 06/14/2016   Achilles Dunk, OTR/L, CLT  Lovett,Amy 11/23/2017, 8:38 PM  [place] MAIN [facility] SERVICES [address] [place], [place], [address] Phone: [phone]   Fax:  [phone]  Name: Laquan Beier MRN: [id number] Date of Birth: Jan 16, 1933

## 2017-11-24 ENCOUNTER — Ambulatory Visit: Payer: Medicare Other | Attending: Neurology | Admitting: Occupational Therapy

## 2017-11-24 DIAGNOSIS — R293 Abnormal posture: Secondary | ICD-10-CM | POA: Diagnosis present

## 2017-11-24 DIAGNOSIS — M6281 Muscle weakness (generalized): Secondary | ICD-10-CM | POA: Diagnosis present

## 2017-11-24 DIAGNOSIS — R262 Difficulty in walking, not elsewhere classified: Secondary | ICD-10-CM | POA: Diagnosis not present

## 2017-11-24 DIAGNOSIS — R2681 Unsteadiness on feet: Secondary | ICD-10-CM | POA: Insufficient documentation

## 2017-11-24 DIAGNOSIS — R278 Other lack of coordination: Secondary | ICD-10-CM | POA: Diagnosis present

## 2017-11-25 ENCOUNTER — Encounter: Payer: Self-pay | Admitting: Occupational Therapy

## 2017-11-25 NOTE — Therapy (Signed)
[place] MAIN [facility] SERVICES [address] [place], [place], [address] Phone: [phone]   Fax:  [phone]  Occupational Therapy Treatment  Patient Details  Name: Luke Durham MRN: [id number] Date of Birth: Apr 21, 1933 No Data Recorded  Encounter Date: 11/24/2017  OT End of Session - 11/25/17 1703    Visit Number  32    Number of Visits  108    Date for OT Re-Evaluation  02/02/18    OT Start Time  1105    OT Stop Time  1200    OT Time Calculation (min)  55 min    Activity Tolerance  Patient tolerated treatment well    Behavior During Therapy  [facility] for tasks assessed/performed       Past Medical History:  Diagnosis Date  . Anxiety   . Arthritis   . Asthma    childhood asthma  . Cancer ([place]) 7 years ago   lymphoma, [place] (recent)  . Chronic kidney disease   . Colon polyps   . GERD (gastroesophageal reflux disease)   . History of kidney stones   . Parkinson disease [facility])     Past Surgical History:  Procedure Laterality Date  . COLON SURGERY    . CYSTOSCOPY WITH LITHOLAPAXY N/A 11/14/2016   Procedure: CYSTOSCOPY WITH LITHOLAPAXY;  Surgeon: Hollice Espy, MD;  Location: [facility] ORS;  Service: Urology;  Laterality: N/A;  . EYE SURGERY Bilateral    Cataract Extraction with IOL  . HERNIA REPAIR  20 years  . MOHS SURGERY    . SMALL INTESTINE SURGERY     Per patient 7 years  . TRANSURETHRAL RESECTION OF PROSTATE N/A 11/14/2016   Procedure: TRANSURETHRAL RESECTION OF THE PROSTATE (TURP);  Surgeon: Hollice Espy, MD;  Location: [facility] ORS;  Service: Urology;  Laterality: N/A;    There were no vitals filed for this visit.  Subjective Assessment - 11/25/17 1700    Subjective   Patient reports he is feeling a bit "puny" today.    Pertinent History  Patient reports he was diagnosed with Parkinson's disease about 10 plus years ago.  He reports a more recent decline in function in his daily activities in the last 6-12 months.  He has been  seeing PT for the last couple months.    Patient Stated Goals  Patient reports he wants to be able to do more for himself and around the house.     Currently in Pain?  No/denies    Pain Score  0-No pain                   OT Treatments/Exercises (OP) - 11/25/17 1701      ADLs   ADL Comments  Patient seen for further assessment of night time waking patterns. Handwriting      Neurological Re-education Exercises   Other Exercises 1  Patient seen for instruction of LSVT BIG exercises: LSVT Daily Session Maximal Daily Exercises: Sustained movements are designed to rescale the amplitude of movement output for generalization to daily functional activities. Performed as follows for 1 set of 10 repetitions each: Multi directional sustained movements- 1) Floor to ceiling, 2) Side to side. Multi directional Repetitive movements performed in standing and are designed to provide retraining effort needed for sustained muscle activation in tasks Performed as follows: 3) Step and reach forward, 4) Step and Reach Backwards, 5) Step and reach sideways, 6) Rock and reach forward/backward, 7) Rock and reach sideways. Exercises performed in standard version without  the use of a chair with min assist to min guard from therapist and verbal cues    Other Exercises 2  Functional mobility skills              OT Education - 11/25/17 1703    Education provided  Yes    Education Details  HEP    Person(s) Educated  Patient    Methods  Explanation;Demonstration;Verbal cues    Comprehension  Verbal cues required;Returned demonstration;Verbalized understanding          OT Long Term Goals - 11/23/17 1831      OT LONG TERM GOAL #1   Title  Patient will demonstrate increase right hand grip to be able to cut food with modified independence.     Baseline  occasional assist required.    Time  12    Period  Weeks    Status  Partially Met      OT LONG TERM GOAL #2   Title  Patient will complete  donning long sleeve shirt with modified independence.    Baseline  requires assist with long sleeves, can perform short sleeves.    Time  12    Period  Weeks    Status  Achieved      OT LONG TERM GOAL #3   Title  Patient will improve coordination to manage buttons on clothing with occasional assistance only.    Baseline  requires assist with buttons each time    Time  12    Period  Weeks    Status  Partially Met      OT LONG TERM GOAL #4   Title  Patient will complete managing pants after toileting with modified independence.     Baseline  assist most days     Time  12    Period  Weeks    Status  Achieved      OT LONG TERM GOAL #5   Title  Patient will demonstrate turning behaviors with rollator using large steps and keeping feet behind the rollator with all turns with occasional minimal cues.     Time  12    Period  Weeks    Status  On-going      OT LONG TERM GOAL #6   Title  Patient will improve gait speed and endurance and be able to walk 950 feet in 6 minutes to negotiate around the home and community safely in 4 weeks    Baseline  860 at reassessment    Time  4    Period  Weeks    Status  Revised      OT LONG TERM GOAL #7   Title  Patient will complete HEP for maximal daily exercises with modified independence in 4 weeks    Time  12    Period  Weeks    Status  Partially Met      OT LONG TERM GOAL #8   Title  Patient will transfer from sit to stand without the use of arms safely and independently from a variety of chairs/surfaces in 4 weeks.     Baseline  difficulty from lower surfaces    Time  4    Period  Weeks    Status  Achieved      OT LONG TERM GOAL  #9   Baseline  Patient will demonstrate decreased episodes of freezing of behaviors from score of 16 to score of less than 12.    Time  4  Period  Weeks    Status  Achieved            Plan - 11/25/17 1703    Clinical Impression Statement  Patient continues to demonstrate progress    Occupational  Profile and client history currently impacting functional performance  progressive disease process, freezing of gait behaviors, fall risk    Occupational performance deficits (Please refer to evaluation for details):  ADL's;IADL's;Leisure    Rehab Potential  Good    Current Impairments/barriers affecting progress:  positive: family support, negative: level of motivation, progressive disease    OT Frequency  2x / week    OT Duration  12 weeks    OT Treatment/Interventions  Self-care/ADL training;Moist Heat;DME and/or AE instruction;Balance training;Therapeutic activities;Therapeutic exercise;Neuromuscular education;Functional Mobility Training;Patient/family education    Consulted and Agree with Plan of Care  Patient       Patient will benefit from skilled therapeutic intervention in order to improve the following deficits and impairments:  Abnormal gait, Decreased coordination, Decreased range of motion, Difficulty walking, Decreased endurance, Decreased safety awareness, Decreased activity tolerance, Decreased balance, Impaired UE functional use, Pain, Decreased mobility, Decreased strength  Visit Diagnosis: Difficulty in walking, not elsewhere classified  Unsteadiness on feet  Abnormal posture  Other lack of coordination  Muscle weakness (generalized)    Problem List Patient Active Problem List   Diagnosis Date Noted  . BPH with urinary obstruction 11/14/2016  . Urinary frequency 10/10/2016  . Microscopic hematuria 10/10/2016  . Parkinson's syndrome ([place]) 06/14/2016  . Arthritis 06/14/2016  . Depression 06/14/2016  . Heartburn 06/14/2016  . Urinary incontinence 06/14/2016  . Skin lesion 06/14/2016  . Dementia 06/14/2016  . Bilateral edema of lower extremity 06/14/2016  Achilles Dunk, OTR/L, CLT  Lovett,Amy 11/25/2017, 5:05 PM  [place] MAIN [facility] SERVICES [address] [place], [place], [address] Phone: [phone]   Fax:   [phone]  Name: Luke Durham MRN: [id number] Date of Birth: 24-May-1933

## 2017-11-27 ENCOUNTER — Ambulatory Visit: Payer: Medicare Other | Admitting: Occupational Therapy

## 2017-11-27 DIAGNOSIS — R293 Abnormal posture: Secondary | ICD-10-CM

## 2017-11-27 DIAGNOSIS — M6281 Muscle weakness (generalized): Secondary | ICD-10-CM

## 2017-11-27 DIAGNOSIS — R262 Difficulty in walking, not elsewhere classified: Secondary | ICD-10-CM

## 2017-11-27 DIAGNOSIS — R278 Other lack of coordination: Secondary | ICD-10-CM

## 2017-11-27 DIAGNOSIS — R2681 Unsteadiness on feet: Secondary | ICD-10-CM

## 2017-11-29 ENCOUNTER — Encounter: Payer: Self-pay | Admitting: Occupational Therapy

## 2017-11-29 NOTE — Therapy (Signed)
[place] MAIN [facility] SERVICES [address] [place], [place], [address] Phone: [phone]   Fax:  [phone]  Occupational Therapy Treatment  Patient Details  Name: Luke Durham MRN: [id number] Date of Birth: [birth year] No Data Recorded  Encounter Date: 11/27/2017  OT End of Session - 11/29/17 1545    Visit Number  87    Number of Visits  108    Date for OT Re-Evaluation  02/02/18    OT Start Time  1400    OT Stop Time  1459    OT Time Calculation (min)  59 min    Activity Tolerance  Patient tolerated treatment well    Behavior During Therapy  [facility] for tasks assessed/performed       Past Medical History:  Diagnosis Date  . Anxiety   . Arthritis   . Asthma    childhood asthma  . Cancer ([place]) 7 years ago   lymphoma, [place] (recent)  . Chronic kidney disease   . Colon polyps   . GERD (gastroesophageal reflux disease)   . History of kidney stones   . Parkinson disease [facility])     Past Surgical History:  Procedure Laterality Date  . COLON SURGERY    . CYSTOSCOPY WITH LITHOLAPAXY N/A 11/14/2016   Procedure: CYSTOSCOPY WITH LITHOLAPAXY;  Surgeon: Hollice Espy, MD;  Location: [facility] ORS;  Service: Urology;  Laterality: N/A;  . EYE SURGERY Bilateral    Cataract Extraction with IOL  . HERNIA REPAIR  20 years  . MOHS SURGERY    . SMALL INTESTINE SURGERY     Per patient 7 years  . TRANSURETHRAL RESECTION OF PROSTATE N/A 11/14/2016   Procedure: TRANSURETHRAL RESECTION OF THE PROSTATE (TURP);  Surgeon: Hollice Espy, MD;  Location: [facility] ORS;  Service: Urology;  Laterality: N/A;    There were no vitals filed for this visit.  Subjective Assessment - 11/29/17 1540    Subjective   Patient denies any falls over the weekend, went out a bit with his wife and daughter to eat.  No complaints this date, still sleepy at times, states, "I fell asleep on the ride over here."    Pertinent History  Patient reports he was diagnosed with Parkinson's  disease about 10 plus years ago.  He reports a more recent decline in function in his daily activities in the last 6-12 months.  He has been seeing PT for the last couple months.    Patient Stated Goals  Patient reports he wants to be able to do more for himself and around the house.     Currently in Pain?  No/denies    Pain Score  0-No pain                   OT Treatments/Exercises (OP) - 11/29/17 1541      ADLs   Writing  Patient seen this date with additional focus on handwriting skills, complains of size of writing is so small no one can read it.  Utilized larger lined paper and patient tends to work towards fitting into the line therefore is a nice cue for him to make his letter larger.  Without the lines, his handwriting is extremely small and illegible.  Prefers to use regular pen over built up grip.      Neurological Re-education Exercises   Other Exercises 1  Patient seen for instruction of LSVT BIG exercises: LSVT Daily Session Maximal Daily Exercises: Sustained movements are designed to rescale the amplitude  of movement output for generalization to daily functional activities. Performed as follows for 1 set of 10 repetitions each: Multi directional sustained movements- 1) Floor to ceiling, 2) Side to side. Multi directional Repetitive movements performed in standing and are designed to provide retraining effort needed for sustained muscle activation in tasks Performed as follows: 3) Step and reach forward, 4) Step and Reach Backwards, 5) Step and reach sideways, 6) Rock and reach forward/backward, 7) Rock and reach sideways. Exercises performed in standard version without the use of a chair with min assist to min guard from therapist and verbal cues.  Increased cues for arm placements this date and appeared to be confused on a couple of exercises.     Other Exercises 2  Functional mobility skills with emphasis on turns in smaller spaces and use of rollator with cues for  positioning of self for safety and to utilize rollator efffectively.             OT Education - 11/29/17 1544    Education provided  Yes    Education Details  handwriting, positioning    Person(s) Educated  Patient    Methods  Explanation;Demonstration;Verbal cues    Comprehension  Verbal cues required;Returned demonstration;Verbalized understanding          OT Long Term Goals - 11/23/17 1831      OT LONG TERM GOAL #1   Title  Patient will demonstrate increase right hand grip to be able to cut food with modified independence.     Baseline  occasional assist required.    Time  12    Period  Weeks    Status  Partially Met      OT LONG TERM GOAL #2   Title  Patient will complete donning long sleeve shirt with modified independence.    Baseline  requires assist with long sleeves, can perform short sleeves.    Time  12    Period  Weeks    Status  Achieved      OT LONG TERM GOAL #3   Title  Patient will improve coordination to manage buttons on clothing with occasional assistance only.    Baseline  requires assist with buttons each time    Time  12    Period  Weeks    Status  Partially Met      OT LONG TERM GOAL #4   Title  Patient will complete managing pants after toileting with modified independence.     Baseline  assist most days     Time  12    Period  Weeks    Status  Achieved      OT LONG TERM GOAL #5   Title  Patient will demonstrate turning behaviors with rollator using large steps and keeping feet behind the rollator with all turns with occasional minimal cues.     Time  12    Period  Weeks    Status  On-going      OT LONG TERM GOAL #6   Title  Patient will improve gait speed and endurance and be able to walk 950 feet in 6 minutes to negotiate around the home and community safely in 4 weeks    Baseline  860 at reassessment    Time  4    Period  Weeks    Status  Revised      OT LONG TERM GOAL #7   Title  Patient will complete HEP for maximal daily  exercises with modified  independence in 4 weeks    Time  12    Period  Weeks    Status  Partially Met      OT LONG TERM GOAL #8   Title  Patient will transfer from sit to stand without the use of arms safely and independently from a variety of chairs/surfaces in 4 weeks.     Baseline  difficulty from lower surfaces    Time  4    Period  Weeks    Status  Achieved      OT LONG TERM GOAL  #9   Baseline  Patient will demonstrate decreased episodes of freezing of behaviors from score of 16 to score of less than 12.    Time  4    Period  Weeks    Status  Achieved            Plan - 11/29/17 1545    Occupational Profile and client history currently impacting functional performance  progressive disease process, freezing of gait behaviors, fall risk    Occupational performance deficits (Please refer to evaluation for details):  ADL's;IADL's;Leisure    Rehab Potential  Good    Current Impairments/barriers affecting progress:  positive: family support, negative: level of motivation, progressive disease    OT Frequency  2x / week    OT Duration  12 weeks    OT Treatment/Interventions  Self-care/ADL training;Moist Heat;DME and/or AE instruction;Balance training;Therapeutic activities;Therapeutic exercise;Neuromuscular education;Functional Mobility Training;Patient/family education    Consulted and Agree with Plan of Care  Patient       Patient will benefit from skilled therapeutic intervention in order to improve the following deficits and impairments:  Abnormal gait, Decreased coordination, Decreased range of motion, Difficulty walking, Decreased endurance, Decreased safety awareness, Decreased activity tolerance, Decreased balance, Impaired UE functional use, Pain, Decreased mobility, Decreased strength  Visit Diagnosis: Difficulty in walking, not elsewhere classified  Unsteadiness on feet  Abnormal posture  Other lack of coordination  Muscle weakness (generalized)    Problem  List Patient Active Problem List   Diagnosis Date Noted  . BPH with urinary obstruction 11/14/2016  . Urinary frequency 10/10/2016  . Microscopic hematuria 10/10/2016  . Parkinson's syndrome ([place]) 06/14/2016  . Arthritis 06/14/2016  . Depression 06/14/2016  . Heartburn 06/14/2016  . Urinary incontinence 06/14/2016  . Skin lesion 06/14/2016  . Dementia 06/14/2016  . Bilateral edema of lower extremity 06/14/2016   Achilles Dunk, OTR/L, CLT  Lovett,Amy 11/29/2017, 3:46 PM  [place] MAIN [facility] SERVICES [address] [place], [place], [address] Phone: [phone]   Fax:  [phone]  Name: Bayley Hurn MRN: [id number] Date of Birth: October 20, 1933

## 2017-11-30 ENCOUNTER — Ambulatory Visit: Payer: Medicare Other | Admitting: Occupational Therapy

## 2017-11-30 DIAGNOSIS — R278 Other lack of coordination: Secondary | ICD-10-CM

## 2017-11-30 DIAGNOSIS — R262 Difficulty in walking, not elsewhere classified: Secondary | ICD-10-CM | POA: Diagnosis not present

## 2017-11-30 DIAGNOSIS — R2681 Unsteadiness on feet: Secondary | ICD-10-CM

## 2017-11-30 DIAGNOSIS — M6281 Muscle weakness (generalized): Secondary | ICD-10-CM

## 2017-11-30 DIAGNOSIS — R293 Abnormal posture: Secondary | ICD-10-CM

## 2017-12-04 ENCOUNTER — Ambulatory Visit: Payer: Medicare Other | Admitting: Occupational Therapy

## 2017-12-04 DIAGNOSIS — R262 Difficulty in walking, not elsewhere classified: Secondary | ICD-10-CM | POA: Diagnosis not present

## 2017-12-04 DIAGNOSIS — R293 Abnormal posture: Secondary | ICD-10-CM

## 2017-12-04 DIAGNOSIS — R278 Other lack of coordination: Secondary | ICD-10-CM

## 2017-12-04 DIAGNOSIS — M6281 Muscle weakness (generalized): Secondary | ICD-10-CM

## 2017-12-04 DIAGNOSIS — R2681 Unsteadiness on feet: Secondary | ICD-10-CM

## 2017-12-07 ENCOUNTER — Ambulatory Visit: Payer: Medicare Other | Admitting: Occupational Therapy

## 2017-12-07 DIAGNOSIS — R262 Difficulty in walking, not elsewhere classified: Secondary | ICD-10-CM

## 2017-12-07 DIAGNOSIS — M6281 Muscle weakness (generalized): Secondary | ICD-10-CM

## 2017-12-07 DIAGNOSIS — R293 Abnormal posture: Secondary | ICD-10-CM

## 2017-12-07 DIAGNOSIS — R278 Other lack of coordination: Secondary | ICD-10-CM

## 2017-12-07 DIAGNOSIS — R2681 Unsteadiness on feet: Secondary | ICD-10-CM

## 2017-12-09 ENCOUNTER — Encounter: Payer: Self-pay | Admitting: Occupational Therapy

## 2017-12-09 NOTE — Therapy (Signed)
[place] MAIN [facility] SERVICES [address] [place], [place], [address] Phone: [phone]   Fax:  [phone]  Occupational Therapy Treatment  Patient Details  Name: Luke Durham MRN: [id number] Date of Birth: 21-Jan-1933 No Data Recorded  Encounter Date: 12/04/2017  OT End of Session - 12/09/17 1236    Visit Number  75    Number of Visits  108    Date for OT Re-Evaluation  02/02/18    OT Start Time  1400    OT Stop Time  1500    OT Time Calculation (min)  60 min    Activity Tolerance  Patient tolerated treatment well    Behavior During Therapy  [facility] for tasks assessed/performed       Past Medical History:  Diagnosis Date  . Anxiety   . Arthritis   . Asthma    childhood asthma  . Cancer ([place]) 7 years ago   lymphoma, Echo (recent)  . Chronic kidney disease   . Colon polyps   . GERD (gastroesophageal reflux disease)   . History of kidney stones   . Parkinson disease [facility])     Past Surgical History:  Procedure Laterality Date  . COLON SURGERY    . CYSTOSCOPY WITH LITHOLAPAXY N/A 11/14/2016   Procedure: CYSTOSCOPY WITH LITHOLAPAXY;  Surgeon: Hollice Espy, MD;  Location: [facility] ORS;  Service: Urology;  Laterality: N/A;  . EYE SURGERY Bilateral    Cataract Extraction with IOL  . HERNIA REPAIR  20 years  . MOHS SURGERY    . SMALL INTESTINE SURGERY     Per patient 7 years  . TRANSURETHRAL RESECTION OF PROSTATE N/A 11/14/2016   Procedure: TRANSURETHRAL RESECTION OF THE PROSTATE (TURP);  Surgeon: Hollice Espy, MD;  Location: [facility] ORS;  Service: Urology;  Laterality: N/A;    There were no vitals filed for this visit.  Subjective Assessment - 12/09/17 1235    Subjective   Patient reports he would like to continue with working on backing up to the chair.     Pertinent History  Patient reports he was diagnosed with Parkinson's disease about 10 plus years ago.  He reports a more recent decline in function in his daily activities in  the last 6-12 months.  He has been seeing PT for the last couple months.    Patient Stated Goals  Patient reports he wants to be able to do more for himself and around the house.     Currently in Pain?  No/denies    Pain Score  0-No pain                   OT Treatments/Exercises (OP) - 12/09/17 1323      ADLs   ADL Comments  Patient seen for taking steps backwards to move closer to the chair to sit down, performed on a variety of surfaces, mat table, chair, bench, cues for size of step moving back and cues for safety.  Patient also seen for negotiation of stairs with SBA and cues, reciprocal stepping patterns.  Handwriting on larger lined paper for formulating lists at home.       Neurological Re-education Exercises   Other Exercises 1  Patient seen for instruction of LSVT BIG exercises: LSVT Daily Session Maximal Daily Exercises: Sustained movements are designed to rescale the amplitude of movement output for generalization to daily functional activities. Performed as follows for 1 set of 10 repetitions each: Multi directional sustained movements- 1) Floor to ceiling,  2) Side to side. Multi directional Repetitive movements performed in standing and are designed to provide retraining effort needed for sustained muscle activation in tasks Performed as follows: 3) Step and reach forward, 4) Step and Reach Backwards, 5) Step and reach sideways, 6) Rock and reach forward/backward, 7) Rock and reach sideways. Exercises performed in standard version without the use of a chair with min assist to min guard from therapist and verbal cues.    Other Exercises 2  Continued focus on taking steps backwards, forwards, side to side.  Functional mobility for 2 trials of 400 feet with cues for amplitude of steps.              OT Education - 12/09/17 1235    Education provided  Yes    Education Details  daily exercises, stepping backwards    Person(s) Educated  Patient    Methods   Explanation;Demonstration;Verbal cues    Comprehension  Verbal cues required;Returned demonstration;Verbalized understanding          OT Long Term Goals - 11/23/17 1831      OT LONG TERM GOAL #1   Title  Patient will demonstrate increase right hand grip to be able to cut food with modified independence.     Baseline  occasional assist required.    Time  12    Period  Weeks    Status  Partially Met      OT LONG TERM GOAL #2   Title  Patient will complete donning long sleeve shirt with modified independence.    Baseline  requires assist with long sleeves, can perform short sleeves.    Time  12    Period  Weeks    Status  Achieved      OT LONG TERM GOAL #3   Title  Patient will improve coordination to manage buttons on clothing with occasional assistance only.    Baseline  requires assist with buttons each time    Time  12    Period  Weeks    Status  Partially Met      OT LONG TERM GOAL #4   Title  Patient will complete managing pants after toileting with modified independence.     Baseline  assist most days     Time  12    Period  Weeks    Status  Achieved      OT LONG TERM GOAL #5   Title  Patient will demonstrate turning behaviors with rollator using large steps and keeping feet behind the rollator with all turns with occasional minimal cues.     Time  12    Period  Weeks    Status  On-going      OT LONG TERM GOAL #6   Title  Patient will improve gait speed and endurance and be able to walk 950 feet in 6 minutes to negotiate around the home and community safely in 4 weeks    Baseline  860 at reassessment    Time  4    Period  Weeks    Status  Revised      OT LONG TERM GOAL #7   Title  Patient will complete HEP for maximal daily exercises with modified independence in 4 weeks    Time  12    Period  Weeks    Status  Partially Met      OT LONG TERM GOAL #8   Title  Patient will transfer from sit to stand without the  use of arms safely and independently from a  variety of chairs/surfaces in 4 weeks.     Baseline  difficulty from lower surfaces    Time  4    Period  Weeks    Status  Achieved      OT LONG TERM GOAL  #9   Baseline  Patient will demonstrate decreased episodes of freezing of behaviors from score of 16 to score of less than 12.    Time  4    Period  Weeks    Status  Achieved            Plan - 12/09/17 1236    Clinical Impression Statement  Patient continues to work towards stepping backwards for sitting down safely, cues for using BIG movements.  Patient required min guard for exercises today, had to reach for chair on 2 occasions with mild loss of balance.  Patient continues to work towards calibration of movements for functional mobility and stepping patterns.     Occupational Profile and client history currently impacting functional performance  progressive disease process, freezing of gait behaviors, fall risk    Occupational performance deficits (Please refer to evaluation for details):  ADL's;IADL's;Leisure    Rehab Potential  Good    Current Impairments/barriers affecting progress:  positive: family support, negative: level of motivation, progressive disease    OT Frequency  2x / week    OT Duration  12 weeks    OT Treatment/Interventions  Self-care/ADL training;Moist Heat;DME and/or AE instruction;Balance training;Therapeutic activities;Therapeutic exercise;Neuromuscular education;Functional Mobility Training;Patient/family education    Consulted and Agree with Plan of Care  Patient       Patient will benefit from skilled therapeutic intervention in order to improve the following deficits and impairments:  Abnormal gait, Decreased coordination, Decreased range of motion, Difficulty walking, Decreased endurance, Decreased safety awareness, Decreased activity tolerance, Decreased balance, Impaired UE functional use, Pain, Decreased mobility, Decreased strength  Visit Diagnosis: Difficulty in walking, not elsewhere  classified  Unsteadiness on feet  Abnormal posture  Other lack of coordination  Muscle weakness (generalized)    Problem List Patient Active Problem List   Diagnosis Date Noted  . BPH with urinary obstruction 11/14/2016  . Urinary frequency 10/10/2016  . Microscopic hematuria 10/10/2016  . Parkinson's syndrome ([place]) 06/14/2016  . Arthritis 06/14/2016  . Depression 06/14/2016  . Heartburn 06/14/2016  . Urinary incontinence 06/14/2016  . Skin lesion 06/14/2016  . Dementia 06/14/2016  . Bilateral edema of lower extremity 06/14/2016   Achilles Dunk, OTR/L, CLT  Lovett,Amy 12/09/2017, 1:31 PM  [place] MAIN [facility] SERVICES [address] [place], [place], [address] Phone: [phone]   Fax:  [phone]  Name: Luke Durham MRN: [id number] Date of Birth: 06-Aug-1933

## 2017-12-09 NOTE — Therapy (Signed)
[place] MAIN [facility] SERVICES [address] [place], [place], [address] Phone: [phone]   Fax:  [phone]  Occupational Therapy Treatment  Patient Details  Name: [name] MRN: [id number] Date of Birth: June 16, 1933 No Data Recorded  Encounter Date: 11/30/2017  OT End of Session - 12/09/17 1221    Visit Number  13    Number of Visits  108    Date for OT Re-Evaluation  02/02/18    OT Start Time  1200    OT Stop Time  1258    OT Time Calculation (min)  58 min    Activity Tolerance  Patient tolerated treatment well    Behavior During Therapy  [facility] for tasks assessed/performed       Past Medical History:  Diagnosis Date  . Anxiety   . Arthritis   . Asthma    childhood asthma  . Cancer ([place]) 7 years ago   lymphoma, Experiment (recent)  . Chronic kidney disease   . Colon polyps   . GERD (gastroesophageal reflux disease)   . History of kidney stones   . Parkinson disease [facility])     Past Surgical History:  Procedure Laterality Date  . COLON SURGERY    . CYSTOSCOPY WITH LITHOLAPAXY N/A 11/14/2016   Procedure: CYSTOSCOPY WITH LITHOLAPAXY;  Surgeon: Hollice Espy, MD;  Location: [facility] ORS;  Service: Urology;  Laterality: N/A;  . EYE SURGERY Bilateral    Cataract Extraction with IOL  . HERNIA REPAIR  20 years  . MOHS SURGERY    . SMALL INTESTINE SURGERY     Per patient 7 years  . TRANSURETHRAL RESECTION OF PROSTATE N/A 11/14/2016   Procedure: TRANSURETHRAL RESECTION OF THE PROSTATE (TURP);  Surgeon: Hollice Espy, MD;  Location: [facility] ORS;  Service: Urology;  Laterality: N/A;    There were no vitals filed for this visit.  Subjective Assessment - 12/09/17 1217    Subjective   Patient reports no changes since last session, denies any falls.     Pertinent History  Patient reports he was diagnosed with Parkinson's disease about 10 plus years ago.  He reports a more recent decline in function in his daily activities in the last 6-12 months.   He has been seeing PT for the last couple months.    Patient Stated Goals  Patient reports he wants to be able to do more for himself and around the house.     Currently in Pain?  No/denies    Pain Score  0-No pain                   OT Treatments/Exercises (OP) - 12/08/17 1218      ADLs   ADL Comments  Patient seen for handwriting skills with use of regular pen and large lined paper with cues for size and letter formation for name, address and phone number.       Neurological Re-education Exercises   Other Exercises 1  Patient seen for instruction of LSVT BIG exercises: LSVT Daily Session Maximal Daily Exercises: Sustained movements are designed to rescale the amplitude of movement output for generalization to daily functional activities. Performed as follows for 1 set of 10 repetitions each: Multi directional sustained movements- 1) Floor to ceiling, 2) Side to side. Multi directional Repetitive movements performed in standing and are designed to provide retraining effort needed for sustained muscle activation in tasks Performed as follows: 3) Step and reach forward, 4) Step and Reach Backwards, 5) Step  and reach sideways, 6) Rock and reach forward/backward, 7) Rock and reach sideways. Exercises performed in standard version without the use of a chair with min assist to min guard from therapist and verbal cues.    Other Exercises 2  Focused on stepping patterns for stepping backwards using BIG principles when backing up to return to seat. Cues required for amplitude of movement.  Some hesistations noted during task, mulitiple trials completed.              OT Education - 12/09/17 1220    Education provided  Yes    Education Details  stepping backwards, posture    Person(s) Educated  Patient    Methods  Explanation;Demonstration;Verbal cues    Comprehension  Verbal cues required;Returned demonstration;Verbalized understanding          OT Long Term Goals - 11/23/17  1831      OT LONG TERM GOAL #1   Title  Patient will demonstrate increase right hand grip to be able to cut food with modified independence.     Baseline  occasional assist required.    Time  12    Period  Weeks    Status  Partially Met      OT LONG TERM GOAL #2   Title  Patient will complete donning long sleeve shirt with modified independence.    Baseline  requires assist with long sleeves, can perform short sleeves.    Time  12    Period  Weeks    Status  Achieved      OT LONG TERM GOAL #3   Title  Patient will improve coordination to manage buttons on clothing with occasional assistance only.    Baseline  requires assist with buttons each time    Time  12    Period  Weeks    Status  Partially Met      OT LONG TERM GOAL #4   Title  Patient will complete managing pants after toileting with modified independence.     Baseline  assist most days     Time  12    Period  Weeks    Status  Achieved      OT LONG TERM GOAL #5   Title  Patient will demonstrate turning behaviors with rollator using large steps and keeping feet behind the rollator with all turns with occasional minimal cues.     Time  12    Period  Weeks    Status  On-going      OT LONG TERM GOAL #6   Title  Patient will improve gait speed and endurance and be able to walk 950 feet in 6 minutes to negotiate around the home and community safely in 4 weeks    Baseline  860 at reassessment    Time  4    Period  Weeks    Status  Revised      OT LONG TERM GOAL #7   Title  Patient will complete HEP for maximal daily exercises with modified independence in 4 weeks    Time  12    Period  Weeks    Status  Partially Met      OT LONG TERM GOAL #8   Title  Patient will transfer from sit to stand without the use of arms safely and independently from a variety of chairs/surfaces in 4 weeks.     Baseline  difficulty from lower surfaces    Time  4    Period  Weeks    Status  Achieved      OT LONG TERM GOAL  #9    Baseline  Patient will demonstrate decreased episodes of freezing of behaviors from score of 16 to score of less than 12.    Time  4    Period  Weeks    Status  Achieved            Plan - 12/09/17 1221    Clinical Impression Statement  Patient demonstrates difficulty with backing up to sit on chair or toilet, requires cues for larger sized steps and use of rollator for positioning of self in relation to chair.  Handwriting improved with cues and use of larger lined paper and demonstrates increased legibility.     Occupational Profile and client history currently impacting functional performance  progressive disease process, freezing of gait behaviors, fall risk    Occupational performance deficits (Please refer to evaluation for details):  ADL's;IADL's;Leisure    Rehab Potential  Good    Current Impairments/barriers affecting progress:  positive: family support, negative: level of motivation, progressive disease    OT Frequency  2x / week    OT Duration  12 weeks    OT Treatment/Interventions  Self-care/ADL training;Moist Heat;DME and/or AE instruction;Balance training;Therapeutic activities;Therapeutic exercise;Neuromuscular education;Functional Mobility Training;Patient/family education    Consulted and Agree with Plan of Care  Patient       Patient will benefit from skilled therapeutic intervention in order to improve the following deficits and impairments:  Abnormal gait, Decreased coordination, Decreased range of motion, Difficulty walking, Decreased endurance, Decreased safety awareness, Decreased activity tolerance, Decreased balance, Impaired UE functional use, Pain, Decreased mobility, Decreased strength  Visit Diagnosis: Difficulty in walking, not elsewhere classified  Unsteadiness on feet  Abnormal posture  Other lack of coordination  Muscle weakness (generalized)    Problem List Patient Active Problem List   Diagnosis Date Noted  . BPH with urinary obstruction  11/14/2016  . Urinary frequency 10/10/2016  . Microscopic hematuria 10/10/2016  . Parkinson's syndrome ([place]) 06/14/2016  . Arthritis 06/14/2016  . Depression 06/14/2016  . Heartburn 06/14/2016  . Urinary incontinence 06/14/2016  . Skin lesion 06/14/2016  . Dementia 06/14/2016  . Bilateral edema of lower extremity 06/14/2016   Achilles Dunk, OTR/L, CLT  Nyiesha Beever 12/09/2017, 12:23 PM  [place] MAIN [facility] SERVICES [address] [place], [place], [address] Phone: [phone]   Fax:  [phone]  Name: Luke Durham MRN: [id number] Date of Birth: 08-Nov-1932

## 2017-12-09 NOTE — Therapy (Signed)
[place] MAIN [facility] SERVICES [address] [place], [place], [address] Phone: [phone]   Fax:  [phone]  Occupational Therapy Treatment  Patient Details  Name: Luke Durham MRN: [id number] Date of Birth: May 25, 1933 No Data Recorded  Encounter Date: 12/07/2017  OT End of Session - 12/09/17 1528    Visit Number  45    Number of Visits  108    Date for OT Re-Evaluation  02/02/18    OT Start Time  1200    OT Stop Time  1300    OT Time Calculation (min)  60 min    Activity Tolerance  Patient tolerated treatment well    Behavior During Therapy  [facility] for tasks assessed/performed       Past Medical History:  Diagnosis Date  . Anxiety   . Arthritis   . Asthma    childhood asthma  . Cancer ([place]) 7 years ago   lymphoma, [place] (recent)  . Chronic kidney disease   . Colon polyps   . GERD (gastroesophageal reflux disease)   . History of kidney stones   . Parkinson disease [facility])     Past Surgical History:  Procedure Laterality Date  . COLON SURGERY    . CYSTOSCOPY WITH LITHOLAPAXY N/A 11/14/2016   Procedure: CYSTOSCOPY WITH LITHOLAPAXY;  Surgeon: Hollice Espy, MD;  Location: [facility] ORS;  Service: Urology;  Laterality: N/A;  . EYE SURGERY Bilateral    Cataract Extraction with IOL  . HERNIA REPAIR  20 years  . MOHS SURGERY    . SMALL INTESTINE SURGERY     Per patient 7 years  . TRANSURETHRAL RESECTION OF PROSTATE N/A 11/14/2016   Procedure: TRANSURETHRAL RESECTION OF THE PROSTATE (TURP);  Surgeon: Hollice Espy, MD;  Location: [facility] ORS;  Service: Urology;  Laterality: N/A;    There were no vitals filed for this visit.  Subjective Assessment - 12/09/17 1527    Subjective   Patient reports he is having a nice Valentines Day, gave his wife a card this morning.     Pertinent History  Patient reports he was diagnosed with Parkinson's disease about 10 plus years ago.  He reports a more recent decline in function in his daily activities  in the last 6-12 months.  He has been seeing PT for the last couple months.    Patient Stated Goals  Patient reports he wants to be able to do more for himself and around the house.     Currently in Pain?  No/denies    Pain Score  0-No pain                   OT Treatments/Exercises (OP) - 12/09/17 1529      ADLs   ADL Comments  Focus on handwriting skills with emphasis on size of letters and legibility.      Neurological Re-education Exercises   Other Exercises 1  Patient seen for instruction of LSVT BIG exercises: LSVT Daily Session Maximal Daily Exercises: Sustained movements are designed to rescale the amplitude of movement output for generalization to daily functional activities. Performed as follows for 1 set of 10 repetitions each: Multi directional sustained movements- 1) Floor to ceiling, 2) Side to side. Multi directional Repetitive movements performed in standing and are designed to provide retraining effort needed for sustained muscle activation in tasks Performed as follows: 3) Step and reach forward, 4) Step and Reach Backwards, 5) Step and reach sideways, 6) Rock and reach forward/backward, 7) [employer]  and reach sideways. Exercises performed in standard version without the use of a chair with min assist to min guard from therapist and verbal cues    Other Exercises 2  Functional mobility for 2 trials of 500 feet, indoors in crowded areas, cues for amplitude of steps and size of turns along with positioning of self.              OT Education - 12/09/17 1527    Education provided  Yes    Education Details  HEP, posture    Person(s) Educated  Patient    Methods  Demonstration;Verbal cues    Comprehension  Verbal cues required;Returned demonstration;Verbalized understanding          OT Long Term Goals - 11/23/17 1831      OT LONG TERM GOAL #1   Title  Patient will demonstrate increase right hand grip to be able to cut food with modified independence.      Baseline  occasional assist required.    Time  12    Period  Weeks    Status  Partially Met      OT LONG TERM GOAL #2   Title  Patient will complete donning long sleeve shirt with modified independence.    Baseline  requires assist with long sleeves, can perform short sleeves.    Time  12    Period  Weeks    Status  Achieved      OT LONG TERM GOAL #3   Title  Patient will improve coordination to manage buttons on clothing with occasional assistance only.    Baseline  requires assist with buttons each time    Time  12    Period  Weeks    Status  Partially Met      OT LONG TERM GOAL #4   Title  Patient will complete managing pants after toileting with modified independence.     Baseline  assist most days     Time  12    Period  Weeks    Status  Achieved      OT LONG TERM GOAL #5   Title  Patient will demonstrate turning behaviors with rollator using large steps and keeping feet behind the rollator with all turns with occasional minimal cues.     Time  12    Period  Weeks    Status  On-going      OT LONG TERM GOAL #6   Title  Patient will improve gait speed and endurance and be able to walk 950 feet in 6 minutes to negotiate around the home and community safely in 4 weeks    Baseline  860 at reassessment    Time  4    Period  Weeks    Status  Revised      OT LONG TERM GOAL #7   Title  Patient will complete HEP for maximal daily exercises with modified independence in 4 weeks    Time  12    Period  Weeks    Status  Partially Met      OT LONG TERM GOAL #8   Title  Patient will transfer from sit to stand without the use of arms safely and independently from a variety of chairs/surfaces in 4 weeks.     Baseline  difficulty from lower surfaces    Time  4    Period  Weeks    Status  Achieved      OT LONG TERM GOAL  #  9   Baseline  Patient will demonstrate decreased episodes of freezing of behaviors from score of 16 to score of less than 12.    Time  4    Period  Weeks     Status  Achieved            Plan - 12/09/17 1528    Clinical Impression Statement  Patient improved with navigating in crowded areas, able to manuever rollator around people and items as needed with minimal cues. Patient demonstrates good technique this date with turns and is requiring less cues than previous sessions.  Continue to work towards improved balance, functional mobility and performance in daily tasks.  Patient continues to benefit from skilled OT to maximize safety and independence in daily tasks.    Occupational Profile and client history currently impacting functional performance  progressive disease process, freezing of gait behaviors, fall risk    Occupational performance deficits (Please refer to evaluation for details):  ADL's;IADL's;Leisure    Rehab Potential  Good    Current Impairments/barriers affecting progress:  positive: family support, negative: level of motivation, progressive disease    OT Frequency  2x / week    OT Duration  12 weeks    OT Treatment/Interventions  Self-care/ADL training;Moist Heat;DME and/or AE instruction;Balance training;Therapeutic activities;Therapeutic exercise;Neuromuscular education;Functional Mobility Training;Patient/family education    Consulted and Agree with Plan of Care  Patient       Patient will benefit from skilled therapeutic intervention in order to improve the following deficits and impairments:  Abnormal gait, Decreased coordination, Decreased range of motion, Difficulty walking, Decreased endurance, Decreased safety awareness, Decreased activity tolerance, Decreased balance, Impaired UE functional use, Pain, Decreased mobility, Decreased strength  Visit Diagnosis: Difficulty in walking, not elsewhere classified  Unsteadiness on feet  Abnormal posture  Other lack of coordination  Muscle weakness (generalized)    Problem List Patient Active Problem List   Diagnosis Date Noted  . BPH with urinary obstruction  11/14/2016  . Urinary frequency 10/10/2016  . Microscopic hematuria 10/10/2016  . Parkinson's syndrome ([place]) 06/14/2016  . Arthritis 06/14/2016  . Depression 06/14/2016  . Heartburn 06/14/2016  . Urinary incontinence 06/14/2016  . Skin lesion 06/14/2016  . Dementia 06/14/2016  . Bilateral edema of lower extremity 06/14/2016   Achilles Dunk, OTR/L, CLT  Lovett,Amy 12/09/2017, 3:36 PM  [place] MAIN [facility] SERVICES [address] [place], [place], [address] Phone: [phone]   Fax:  [phone]  Name: Luke Durham MRN: [id number] Date of Birth: 1933/06/27

## 2017-12-10 ENCOUNTER — Other Ambulatory Visit: Payer: Self-pay | Admitting: Physician Assistant

## 2017-12-10 DIAGNOSIS — F32A Depression, unspecified: Secondary | ICD-10-CM

## 2017-12-10 DIAGNOSIS — F329 Major depressive disorder, single episode, unspecified: Secondary | ICD-10-CM

## 2017-12-11 ENCOUNTER — Telehealth: Payer: Self-pay | Admitting: Physician Assistant

## 2017-12-11 NOTE — Telephone Encounter (Signed)
Please see note on patient's wife chart.

## 2017-12-11 NOTE — Telephone Encounter (Signed)
Pt's wife is requesting a call back to make sure pt is due for 2 nd pneumonia vaccine and that pt is added to the wait list to get his second round of the shingles vaccine. Please advise. Thanks TNP

## 2017-12-12 ENCOUNTER — Ambulatory Visit: Payer: Medicare Other | Admitting: Occupational Therapy

## 2017-12-12 DIAGNOSIS — M6281 Muscle weakness (generalized): Secondary | ICD-10-CM

## 2017-12-12 DIAGNOSIS — R293 Abnormal posture: Secondary | ICD-10-CM

## 2017-12-12 DIAGNOSIS — R262 Difficulty in walking, not elsewhere classified: Secondary | ICD-10-CM

## 2017-12-12 DIAGNOSIS — R278 Other lack of coordination: Secondary | ICD-10-CM

## 2017-12-12 DIAGNOSIS — R2681 Unsteadiness on feet: Secondary | ICD-10-CM

## 2017-12-14 ENCOUNTER — Ambulatory Visit: Payer: Medicare Other | Admitting: Occupational Therapy

## 2017-12-18 ENCOUNTER — Ambulatory Visit: Payer: Medicare Other | Admitting: Occupational Therapy

## 2017-12-18 DIAGNOSIS — R293 Abnormal posture: Secondary | ICD-10-CM

## 2017-12-18 DIAGNOSIS — R262 Difficulty in walking, not elsewhere classified: Secondary | ICD-10-CM

## 2017-12-18 DIAGNOSIS — M6281 Muscle weakness (generalized): Secondary | ICD-10-CM

## 2017-12-18 DIAGNOSIS — R278 Other lack of coordination: Secondary | ICD-10-CM

## 2017-12-18 DIAGNOSIS — R2681 Unsteadiness on feet: Secondary | ICD-10-CM

## 2017-12-19 ENCOUNTER — Ambulatory Visit: Payer: Medicare Other | Admitting: Occupational Therapy

## 2017-12-20 ENCOUNTER — Encounter: Payer: Self-pay | Admitting: Occupational Therapy

## 2017-12-20 NOTE — Therapy (Signed)
[place] MAIN [facility] SERVICES [address] [place], [place], [address] Phone: [phone]   Fax:  [phone]  Occupational Therapy Treatment  Patient Details  Name: Luke Durham MRN: [id number] Date of Birth: 01/17/1933 No Data Recorded  Encounter Date: 12/12/2017  OT End of Session - 12/20/17 1711    Visit Number  91    Number of Visits  108    Date for OT Re-Evaluation  02/02/18    OT Start Time  1445    OT Stop Time  1540    OT Time Calculation (min)  55 min    Activity Tolerance  Patient tolerated treatment well    Behavior During Therapy  [facility] for tasks assessed/performed       Past Medical History:  Diagnosis Date  . Anxiety   . Arthritis   . Asthma    childhood asthma  . Cancer ([place]) 7 years ago   lymphoma, [place] (recent)  . Chronic kidney disease   . Colon polyps   . GERD (gastroesophageal reflux disease)   . History of kidney stones   . Parkinson disease [facility])     Past Surgical History:  Procedure Laterality Date  . COLON SURGERY    . CYSTOSCOPY WITH LITHOLAPAXY N/A 11/14/2016   Procedure: CYSTOSCOPY WITH LITHOLAPAXY;  Surgeon: Hollice Espy, MD;  Location: [facility] ORS;  Service: Urology;  Laterality: N/A;  . EYE SURGERY Bilateral    Cataract Extraction with IOL  . HERNIA REPAIR  20 years  . MOHS SURGERY    . SMALL INTESTINE SURGERY     Per patient 7 years  . TRANSURETHRAL RESECTION OF PROSTATE N/A 11/14/2016   Procedure: TRANSURETHRAL RESECTION OF THE PROSTATE (TURP);  Surgeon: Hollice Espy, MD;  Location: [facility] ORS;  Service: Urology;  Laterality: N/A;    There were no vitals filed for this visit.  Subjective Assessment - 12/20/17 1710    Subjective   Patient denies any pain, reports he is doing as well as he can and wished it would stop raining.    Pertinent History  Patient reports he was diagnosed with Parkinson's disease about 10 plus years ago.  He reports a more recent decline in function in his daily  activities in the last 6-12 months.  He has been seeing PT for the last couple months.    Patient Stated Goals  Patient reports he wants to be able to do more for himself and around the house.     Currently in Pain?  No/denies    Pain Score  0-No pain                   OT Treatments/Exercises (OP) - 12/20/17 1716      Neurological Re-education Exercises   Other Exercises 1  Patient seen for instruction of LSVT BIG exercises: LSVT Daily Session Maximal Daily Exercises: Sustained movements are designed to rescale the amplitude of movement output for generalization to daily functional activities. Performed as follows for 1 set of 10 repetitions each: Multi directional sustained movements- 1) Floor to ceiling, 2) Side to side. Multi directional Repetitive movements performed in standing and are designed to provide retraining effort needed for sustained muscle activation in tasks Performed as follows: 3) Step and reach forward, 4) Step and Reach Backwards, 5) Step and reach sideways, 6) Rock and reach forward/backward, 7) Rock and reach sideways. Exercises performed in standard version without the use of a chair with min assist to min guard from  therapist and verbal cues    Other Exercises 2  Patient seen for focus on turning in small spaces with use of rollator with cues for positioning of self in relation to rollator, tends to turn towards left primarily and then requires cues to take a large step to the right to get behind the walker. Patient completed multiple trials and then added turns to the right as well with evidence of carryover from cues. Patient seen for functional mobility on flat surfaces indoors for 2 trials of 500 feet with one rest break, cues for amplitude of steps and also for turns.              OT Education - 12/20/17 1711    Education provided  Yes    Education Details  daily exercises, posture, balance    Person(s) Educated  Patient    Methods   Explanation;Demonstration;Verbal cues    Comprehension  Verbal cues required;Returned demonstration;Verbalized understanding          OT Long Term Goals - 11/23/17 1831      OT LONG TERM GOAL #1   Title  Patient will demonstrate increase right hand grip to be able to cut food with modified independence.     Baseline  occasional assist required.    Time  12    Period  Weeks    Status  Partially Met      OT LONG TERM GOAL #2   Title  Patient will complete donning long sleeve shirt with modified independence.    Baseline  requires assist with long sleeves, can perform short sleeves.    Time  12    Period  Weeks    Status  Achieved      OT LONG TERM GOAL #3   Title  Patient will improve coordination to manage buttons on clothing with occasional assistance only.    Baseline  requires assist with buttons each time    Time  12    Period  Weeks    Status  Partially Met      OT LONG TERM GOAL #4   Title  Patient will complete managing pants after toileting with modified independence.     Baseline  assist most days     Time  12    Period  Weeks    Status  Achieved      OT LONG TERM GOAL #5   Title  Patient will demonstrate turning behaviors with rollator using large steps and keeping feet behind the rollator with all turns with occasional minimal cues.     Time  12    Period  Weeks    Status  On-going      OT LONG TERM GOAL #6   Title  Patient will improve gait speed and endurance and be able to walk 950 feet in 6 minutes to negotiate around the home and community safely in 4 weeks    Baseline  860 at reassessment    Time  4    Period  Weeks    Status  Revised      OT LONG TERM GOAL #7   Title  Patient will complete HEP for maximal daily exercises with modified independence in 4 weeks    Time  12    Period  Weeks    Status  Partially Met      OT LONG TERM GOAL #8   Title  Patient will transfer from sit to stand without the use of arms  safely and independently from a  variety of chairs/surfaces in 4 weeks.     Baseline  difficulty from lower surfaces    Time  4    Period  Weeks    Status  Achieved      OT LONG TERM GOAL  #9   Baseline  Patient will demonstrate decreased episodes of freezing of behaviors from score of 16 to score of less than 12.    Time  4    Period  Weeks    Status  Achieved            Plan - 12/20/17 1712    Clinical Impression Statement  Patient continues to progress with turning behaviors, decreased cues for positioning of self, mostly cues are to increase the size of his step to get himself behind the walker without small shuffling steps.  Also focused on stepping backwards to prepare to sit down, cues for size of step.  Patient continues to progress with balance, functional mobility and managing assistive device with increased safety.     Occupational Profile and client history currently impacting functional performance  progressive disease process, freezing of gait behaviors, fall risk    Occupational performance deficits (Please refer to evaluation for details):  ADL's;IADL's;Leisure    Rehab Potential  Good    Current Impairments/barriers affecting progress:  positive: family support, negative: level of motivation, progressive disease    OT Frequency  2x / week    OT Duration  12 weeks    OT Treatment/Interventions  Self-care/ADL training;Moist Heat;DME and/or AE instruction;Balance training;Therapeutic activities;Therapeutic exercise;Neuromuscular education;Functional Mobility Training;Patient/family education    Consulted and Agree with Plan of Care  Patient       Patient will benefit from skilled therapeutic intervention in order to improve the following deficits and impairments:  Abnormal gait, Decreased coordination, Decreased range of motion, Difficulty walking, Decreased endurance, Decreased safety awareness, Decreased activity tolerance, Decreased balance, Impaired UE functional use, Pain, Decreased mobility,  Decreased strength  Visit Diagnosis: Difficulty in walking, not elsewhere classified  Unsteadiness on feet  Abnormal posture  Other lack of coordination  Muscle weakness (generalized)    Problem List Patient Active Problem List   Diagnosis Date Noted  . BPH with urinary obstruction 11/14/2016  . Urinary frequency 10/10/2016  . Microscopic hematuria 10/10/2016  . Parkinson's syndrome ([place]) 06/14/2016  . Arthritis 06/14/2016  . Depression 06/14/2016  . Heartburn 06/14/2016  . Urinary incontinence 06/14/2016  . Skin lesion 06/14/2016  . Dementia 06/14/2016  . Bilateral edema of lower extremity 06/14/2016   Achilles Dunk, OTR/L, CLT  Lovett,Amy 12/20/2017, 5:21 PM  [place] MAIN [facility] SERVICES [address] [place], [place], [address] Phone: [phone]   Fax:  [phone]  Name: Luke Durham MRN: [id number] Date of Birth: 12-18-1932

## 2017-12-21 ENCOUNTER — Ambulatory Visit: Payer: Medicare Other | Admitting: Occupational Therapy

## 2017-12-21 ENCOUNTER — Encounter: Payer: Self-pay | Admitting: Occupational Therapy

## 2017-12-21 DIAGNOSIS — R262 Difficulty in walking, not elsewhere classified: Secondary | ICD-10-CM | POA: Diagnosis not present

## 2017-12-21 DIAGNOSIS — R278 Other lack of coordination: Secondary | ICD-10-CM

## 2017-12-21 DIAGNOSIS — R293 Abnormal posture: Secondary | ICD-10-CM

## 2017-12-21 DIAGNOSIS — R2681 Unsteadiness on feet: Secondary | ICD-10-CM

## 2017-12-21 DIAGNOSIS — M6281 Muscle weakness (generalized): Secondary | ICD-10-CM

## 2017-12-21 NOTE — Therapy (Signed)
[place] MAIN [facility] SERVICES [address] [place], [place], [address] Phone: [phone]   Fax:  [phone]  Occupational Therapy Treatment  Patient Details  Name: Luke Durham MRN: [id number] Date of Birth: 11/15/1932 No Data Recorded  Encounter Date: 12/21/2017  OT End of Session - 12/21/17 1900    Visit Number  93    Number of Visits  108    Date for OT Re-Evaluation  02/02/18    OT Start Time  1443    OT Stop Time  1540    OT Time Calculation (min)  57 min    Activity Tolerance  Patient tolerated treatment well    Behavior During Therapy  [facility] for tasks assessed/performed       Past Medical History:  Diagnosis Date  . Anxiety   . Arthritis   . Asthma    childhood asthma  . Cancer ([place]) 7 years ago   lymphoma, [place] (recent)  . Chronic kidney disease   . Colon polyps   . GERD (gastroesophageal reflux disease)   . History of kidney stones   . Parkinson disease [facility])     Past Surgical History:  Procedure Laterality Date  . COLON SURGERY    . CYSTOSCOPY WITH LITHOLAPAXY N/A 11/14/2016   Procedure: CYSTOSCOPY WITH LITHOLAPAXY;  Surgeon: Hollice Espy, MD;  Location: [facility] ORS;  Service: Urology;  Laterality: N/A;  . EYE SURGERY Bilateral    Cataract Extraction with IOL  . HERNIA REPAIR  20 years  . MOHS SURGERY    . SMALL INTESTINE SURGERY     Per patient 7 years  . TRANSURETHRAL RESECTION OF PROSTATE N/A 11/14/2016   Procedure: TRANSURETHRAL RESECTION OF THE PROSTATE (TURP);  Surgeon: Hollice Espy, MD;  Location: [facility] ORS;  Service: Urology;  Laterality: N/A;    There were no vitals filed for this visit.  Subjective Assessment - 12/21/17 1858    Subjective   Patient reports he will have to go to the dermatologist about a few places on his head that they want to remove. Patient requesting to work on stair negotiation today, went to [employer] [facility] and needed to go up a couple stairs.     Pertinent History  Patient  reports he was diagnosed with Parkinson's disease about 10 plus years ago.  He reports a more recent decline in function in his daily activities in the last 6-12 months.  He has been seeing PT for the last couple months.    Patient Stated Goals  Patient reports he wants to be able to do more for himself and around the house.     Currently in Pain?  No/denies    Pain Score  0-No pain                   OT Treatments/Exercises (OP) - 12/21/17 1936      Neurological Re-education Exercises   Other Exercises 1  Patient seen for instruction of LSVT BIG exercises: LSVT Daily Session Maximal Daily Exercises: Sustained movements are designed to rescale the amplitude of movement output for generalization to daily functional activities. Performed as follows for 1 set of 10 repetitions each: Multi directional sustained movements- 1) Floor to ceiling, 2) Side to side. Multi directional Repetitive movements performed in standing and are designed to provide retraining effort needed for sustained muscle activation in tasks Performed as follows: 3) Step and reach forward, 4) Step and Reach Backwards, 5) Step and reach sideways, 6) Rock and reach  forward/backward, 7) Rock and reach sideways. Min assist with exercises today, with decreased stability and decreased balance.     Other Exercises 2  Patient seen for stair negotiation to be able to access local theatre with greater ease.  Cues for placement of foot on step and for safety.  Patient seen for functional mobility skills for 1 trial of 400 feet with cues for turning and amplitude of steps.              OT Education - 12/21/17 1859    Education provided  Yes    Education Details  stair negotiation    Methods  Explanation;Demonstration;Verbal cues    Comprehension  Verbal cues required;Returned demonstration;Verbalized understanding          OT Long Term Goals - 11/23/17 1831      OT LONG TERM GOAL #1   Title  Patient will demonstrate  increase right hand grip to be able to cut food with modified independence.     Baseline  occasional assist required.    Time  12    Period  Weeks    Status  Partially Met      OT LONG TERM GOAL #2   Title  Patient will complete donning long sleeve shirt with modified independence.    Baseline  requires assist with long sleeves, can perform short sleeves.    Time  12    Period  Weeks    Status  Achieved      OT LONG TERM GOAL #3   Title  Patient will improve coordination to manage buttons on clothing with occasional assistance only.    Baseline  requires assist with buttons each time    Time  12    Period  Weeks    Status  Partially Met      OT LONG TERM GOAL #4   Title  Patient will complete managing pants after toileting with modified independence.     Baseline  assist most days     Time  12    Period  Weeks    Status  Achieved      OT LONG TERM GOAL #5   Title  Patient will demonstrate turning behaviors with rollator using large steps and keeping feet behind the rollator with all turns with occasional minimal cues.     Time  12    Period  Weeks    Status  On-going      OT LONG TERM GOAL #6   Title  Patient will improve gait speed and endurance and be able to walk 950 feet in 6 minutes to negotiate around the home and community safely in 4 weeks    Baseline  860 at reassessment    Time  4    Period  Weeks    Status  Revised      OT LONG TERM GOAL #7   Title  Patient will complete HEP for maximal daily exercises with modified independence in 4 weeks    Time  12    Period  Weeks    Status  Partially Met      OT LONG TERM GOAL #8   Title  Patient will transfer from sit to stand without the use of arms safely and independently from a variety of chairs/surfaces in 4 weeks.     Baseline  difficulty from lower surfaces    Time  4    Period  Weeks    Status  Achieved  OT LONG TERM GOAL  #9   Baseline  Patient will demonstrate decreased episodes of freezing of  behaviors from score of 16 to score of less than 12.    Time  4    Period  Weeks    Status  Achieved            Plan - 12/21/17 1900    Clinical Impression Statement  Patient with increased difficulty this date with exercises and balance, when analyzing and questioning patient, it appears he forgot to take his lunch time meds which have affected his performance this date.  Increased assist for all exercises, stair negotiation and functional mobility.  Discussed the importance of managing meds and timing of meds which affect performance and safety with daily tasks.     Occupational Profile and client history currently impacting functional performance  progressive disease process, freezing of gait behaviors, fall risk    Occupational performance deficits (Please refer to evaluation for details):  ADL's;IADL's;Leisure    Rehab Potential  Good    Current Impairments/barriers affecting progress:  positive: family support, negative: level of motivation, progressive disease    OT Frequency  2x / week    OT Duration  12 weeks    OT Treatment/Interventions  Self-care/ADL training;Moist Heat;DME and/or AE instruction;Balance training;Therapeutic activities;Therapeutic exercise;Neuromuscular education;Functional Mobility Training;Patient/family education    Consulted and Agree with Plan of Care  Patient       Patient will benefit from skilled therapeutic intervention in order to improve the following deficits and impairments:  Abnormal gait, Decreased coordination, Decreased range of motion, Difficulty walking, Decreased endurance, Decreased safety awareness, Decreased activity tolerance, Decreased balance, Impaired UE functional use, Pain, Decreased mobility, Decreased strength  Visit Diagnosis: Difficulty in walking, not elsewhere classified  Unsteadiness on feet  Abnormal posture  Other lack of coordination  Muscle weakness (generalized)    Problem List Patient Active Problem List    Diagnosis Date Noted  . BPH with urinary obstruction 11/14/2016  . Urinary frequency 10/10/2016  . Microscopic hematuria 10/10/2016  . Parkinson's syndrome ([place]) 06/14/2016  . Arthritis 06/14/2016  . Depression 06/14/2016  . Heartburn 06/14/2016  . Urinary incontinence 06/14/2016  . Skin lesion 06/14/2016  . Dementia 06/14/2016  . Bilateral edema of lower extremity 06/14/2016   Achilles Dunk, OTR/L, CLT  Lovett,Amy 12/21/2017, 7:40 PM  [place] MAIN [facility] SERVICES [address] [place], [place], [address] Phone: [phone]   Fax:  [phone]  Name: Luke Durham MRN: [id number] Date of Birth: 1933/02/07

## 2017-12-21 NOTE — Therapy (Signed)
[place] MAIN [facility] SERVICES [address] [place], [place], [address] Phone: [phone]   Fax:  [phone]  Occupational Therapy Treatment  Patient Details  Name: Luke Durham MRN: [id number] Date of Birth: 1933/02/02 No Data Recorded  Encounter Date: 12/18/2017  OT End of Session - 12/20/17 1723    Visit Number  92    Number of Visits  108    Date for OT Re-Evaluation  02/02/18    OT Start Time  1445    OT Stop Time  1544    OT Time Calculation (min)  59 min    Activity Tolerance  Patient tolerated treatment well    Behavior During Therapy  [facility] for tasks assessed/performed       Past Medical History:  Diagnosis Date  . Anxiety   . Arthritis   . Asthma    childhood asthma  . Cancer ([place]) 7 years ago   lymphoma, [place] (recent)  . Chronic kidney disease   . Colon polyps   . GERD (gastroesophageal reflux disease)   . History of kidney stones   . Parkinson disease [facility])     Past Surgical History:  Procedure Laterality Date  . COLON SURGERY    . CYSTOSCOPY WITH LITHOLAPAXY N/A 11/14/2016   Procedure: CYSTOSCOPY WITH LITHOLAPAXY;  Surgeon: Hollice Espy, MD;  Location: [facility] ORS;  Service: Urology;  Laterality: N/A;  . EYE SURGERY Bilateral    Cataract Extraction with IOL  . HERNIA REPAIR  20 years  . MOHS SURGERY    . SMALL INTESTINE SURGERY     Per patient 7 years  . TRANSURETHRAL RESECTION OF PROSTATE N/A 11/14/2016   Procedure: TRANSURETHRAL RESECTION OF THE PROSTATE (TURP);  Surgeon: Hollice Espy, MD;  Location: [facility] ORS;  Service: Urology;  Laterality: N/A;    There were no vitals filed for this visit.  Subjective Assessment - 12/20/17 1722    Subjective   Patient reports he had a good weekend, did not go out since it was raining so much.  His dog is doing well at home.     Pertinent History  Patient reports he was diagnosed with Parkinson's disease about 10 plus years ago.  He reports a more recent decline in  function in his daily activities in the last 6-12 months.  He has been seeing PT for the last couple months.    Patient Stated Goals  Patient reports he wants to be able to do more for himself and around the house.     Currently in Pain?  No/denies    Pain Score  0-No pain    Multiple Pain Sites  No                   OT Treatments/Exercises (OP) - 12/21/17 1419      ADLs   ADL Comments  Handwriting skills with use of large lined paper to write name and address, making list of 10 things with cues for size of letter formation.      Neurological Re-education Exercises   Other Exercises 1  Patient seen for instruction of LSVT BIG exercises: LSVT Daily Session Maximal Daily Exercises: Sustained movements are designed to rescale the amplitude of movement output for generalization to daily functional activities. Performed as follows for 1 set of 10 repetitions each: Multi directional sustained movements- 1) Floor to ceiling, 2) Side to side. Multi directional Repetitive movements performed in standing and are designed to provide retraining effort needed for  sustained muscle activation in tasks Performed as follows: 3) Step and reach forward, 4) Step and Reach Backwards, 5) Step and reach sideways, 6) Rock and reach forward/backward, 7) Rock and reach sideways. Min guard to perform exercises and verbal cues required, no loss of balance this date.     Other Exercises 2  Patient seen for reaching to feet to place socks and shoes on.  Patient  seen for functional mobility with rollator to navigate in crowded hallways, cafeteria and small space such as elevator.  Patient demonstrates decreased hesitations when moving around others and responds cues to keep going.              OT Education - 12/20/17 1723    Education provided  Yes    Education Details  arm placement with exercises, balance    Person(s) Educated  Patient    Methods  Explanation;Demonstration;Verbal cues    Comprehension   Verbal cues required;Returned demonstration;Verbalized understanding          OT Long Term Goals - 11/23/17 1831      OT LONG TERM GOAL #1   Title  Patient will demonstrate increase right hand grip to be able to cut food with modified independence.     Baseline  occasional assist required.    Time  12    Period  Weeks    Status  Partially Met      OT LONG TERM GOAL #2   Title  Patient will complete donning long sleeve shirt with modified independence.    Baseline  requires assist with long sleeves, can perform short sleeves.    Time  12    Period  Weeks    Status  Achieved      OT LONG TERM GOAL #3   Title  Patient will improve coordination to manage buttons on clothing with occasional assistance only.    Baseline  requires assist with buttons each time    Time  12    Period  Weeks    Status  Partially Met      OT LONG TERM GOAL #4   Title  Patient will complete managing pants after toileting with modified independence.     Baseline  assist most days     Time  12    Period  Weeks    Status  Achieved      OT LONG TERM GOAL #5   Title  Patient will demonstrate turning behaviors with rollator using large steps and keeping feet behind the rollator with all turns with occasional minimal cues.     Time  12    Period  Weeks    Status  On-going      OT LONG TERM GOAL #6   Title  Patient will improve gait speed and endurance and be able to walk 950 feet in 6 minutes to negotiate around the home and community safely in 4 weeks    Baseline  860 at reassessment    Time  4    Period  Weeks    Status  Revised      OT LONG TERM GOAL #7   Title  Patient will complete HEP for maximal daily exercises with modified independence in 4 weeks    Time  12    Period  Weeks    Status  Partially Met      OT LONG TERM GOAL #8   Title  Patient will transfer from sit to stand without the use of  arms safely and independently from a variety of chairs/surfaces in 4 weeks.     Baseline   difficulty from lower surfaces    Time  4    Period  Weeks    Status  Achieved      OT LONG TERM GOAL  #9   Baseline  Patient will demonstrate decreased episodes of freezing of behaviors from score of 16 to score of less than 12.    Time  4    Period  Weeks    Status  Achieved            Plan - 12/20/17 1724    Clinical Impression Statement  Patient progressing with navigating in crowded spaces, he used to freeze at times and allow people to move  around him.  Now he can keep moving and navigate around other people without hesitations or stopping.  Occasional cues required but responds well. Patient continues to work towards balance and functional mobility skills. Continue to work towards goals in [place].     Occupational Profile and client history currently impacting functional performance  progressive disease process, freezing of gait behaviors, fall risk    Occupational performance deficits (Please refer to evaluation for details):  ADL's;IADL's;Leisure    Rehab Potential  Good    Current Impairments/barriers affecting progress:  positive: family support, negative: level of motivation, progressive disease    OT Frequency  2x / week    OT Duration  12 weeks    OT Treatment/Interventions  Self-care/ADL training;Moist Heat;DME and/or AE instruction;Balance training;Therapeutic activities;Therapeutic exercise;Neuromuscular education;Functional Mobility Training;Patient/family education    Consulted and Agree with Plan of Care  Patient       Patient will benefit from skilled therapeutic intervention in order to improve the following deficits and impairments:  Abnormal gait, Decreased coordination, Decreased range of motion, Difficulty walking, Decreased endurance, Decreased safety awareness, Decreased activity tolerance, Decreased balance, Impaired UE functional use, Pain, Decreased mobility, Decreased strength  Visit Diagnosis: Difficulty in walking, not elsewhere  classified  Unsteadiness on feet  Abnormal posture  Other lack of coordination  Muscle weakness (generalized)    Problem List Patient Active Problem List   Diagnosis Date Noted  . BPH with urinary obstruction 11/14/2016  . Urinary frequency 10/10/2016  . Microscopic hematuria 10/10/2016  . Parkinson's syndrome ([place]) 06/14/2016  . Arthritis 06/14/2016  . Depression 06/14/2016  . Heartburn 06/14/2016  . Urinary incontinence 06/14/2016  . Skin lesion 06/14/2016  . Dementia 06/14/2016  . Bilateral edema of lower extremity 06/14/2016   Achilles Dunk, OTR/L, CLT  Sharisse Rantz 12/21/2017, 2:24 PM  [place] MAIN [facility] SERVICES [address] [place], [place], [address] Phone: [phone]   Fax:  [phone]  Name: Luke Durham MRN: [id number] Date of Birth: [birth year]

## 2017-12-25 ENCOUNTER — Ambulatory Visit: Payer: Medicare Other | Attending: Neurology | Admitting: Occupational Therapy

## 2017-12-25 DIAGNOSIS — R293 Abnormal posture: Secondary | ICD-10-CM | POA: Diagnosis present

## 2017-12-25 DIAGNOSIS — R262 Difficulty in walking, not elsewhere classified: Secondary | ICD-10-CM | POA: Insufficient documentation

## 2017-12-25 DIAGNOSIS — R2681 Unsteadiness on feet: Secondary | ICD-10-CM | POA: Insufficient documentation

## 2017-12-25 DIAGNOSIS — R278 Other lack of coordination: Secondary | ICD-10-CM

## 2017-12-25 DIAGNOSIS — M6281 Muscle weakness (generalized): Secondary | ICD-10-CM | POA: Diagnosis present

## 2017-12-25 DIAGNOSIS — M25511 Pain in right shoulder: Secondary | ICD-10-CM | POA: Diagnosis present

## 2017-12-25 DIAGNOSIS — G8929 Other chronic pain: Secondary | ICD-10-CM | POA: Insufficient documentation

## 2017-12-26 ENCOUNTER — Ambulatory Visit: Payer: Medicare Other | Admitting: Physician Assistant

## 2017-12-29 ENCOUNTER — Ambulatory Visit: Payer: Medicare Other | Admitting: Occupational Therapy

## 2017-12-29 DIAGNOSIS — R262 Difficulty in walking, not elsewhere classified: Secondary | ICD-10-CM

## 2017-12-29 DIAGNOSIS — R293 Abnormal posture: Secondary | ICD-10-CM

## 2017-12-29 DIAGNOSIS — M6281 Muscle weakness (generalized): Secondary | ICD-10-CM

## 2017-12-29 DIAGNOSIS — R2681 Unsteadiness on feet: Secondary | ICD-10-CM

## 2017-12-29 DIAGNOSIS — R278 Other lack of coordination: Secondary | ICD-10-CM

## 2018-01-02 ENCOUNTER — Encounter: Payer: Self-pay | Admitting: Occupational Therapy

## 2018-01-02 ENCOUNTER — Ambulatory Visit: Payer: Medicare Other | Admitting: Occupational Therapy

## 2018-01-02 DIAGNOSIS — R278 Other lack of coordination: Secondary | ICD-10-CM

## 2018-01-02 DIAGNOSIS — R2681 Unsteadiness on feet: Secondary | ICD-10-CM

## 2018-01-02 DIAGNOSIS — M25511 Pain in right shoulder: Secondary | ICD-10-CM

## 2018-01-02 DIAGNOSIS — M6281 Muscle weakness (generalized): Secondary | ICD-10-CM

## 2018-01-02 DIAGNOSIS — R262 Difficulty in walking, not elsewhere classified: Secondary | ICD-10-CM

## 2018-01-02 DIAGNOSIS — R293 Abnormal posture: Secondary | ICD-10-CM

## 2018-01-02 DIAGNOSIS — G8929 Other chronic pain: Secondary | ICD-10-CM

## 2018-01-02 NOTE — Therapy (Signed)
[place] MAIN [facility] SERVICES [address] [place], [place], [address] Phone: [phone]   Fax:  [phone]  Occupational Therapy Treatment  Patient Details  Name: Luke Durham MRN: [id number] Date of Birth: October 10, 1933 No Data Recorded  Encounter Date: 12/25/2017  OT End of Session - 01/02/18 1956    Visit Number  94    Number of Visits  108    Date for OT Re-Evaluation  02/02/18    OT Start Time  1400    OT Stop Time  1455    OT Time Calculation (min)  55 min    Activity Tolerance  Patient tolerated treatment well    Behavior During Therapy  [facility] for tasks assessed/performed       Past Medical History:  Diagnosis Date  . Anxiety   . Arthritis   . Asthma    childhood asthma  . Cancer ([place]) 7 years ago   lymphoma, [place] (recent)  . Chronic kidney disease   . Colon polyps   . GERD (gastroesophageal reflux disease)   . History of kidney stones   . Parkinson disease [facility])     Past Surgical History:  Procedure Laterality Date  . COLON SURGERY    . CYSTOSCOPY WITH LITHOLAPAXY N/A 11/14/2016   Procedure: CYSTOSCOPY WITH LITHOLAPAXY;  Surgeon: Hollice Espy, MD;  Location: [facility] ORS;  Service: Urology;  Laterality: N/A;  . EYE SURGERY Bilateral    Cataract Extraction with IOL  . HERNIA REPAIR  20 years  . MOHS SURGERY    . SMALL INTESTINE SURGERY     Per patient 7 years  . TRANSURETHRAL RESECTION OF PROSTATE N/A 11/14/2016   Procedure: TRANSURETHRAL RESECTION OF THE PROSTATE (TURP);  Surgeon: Hollice Espy, MD;  Location: [facility] ORS;  Service: Urology;  Laterality: N/A;    There were no vitals filed for this visit.  Subjective Assessment - 01/02/18 1947    Subjective   Patient reports he had a good weekend, his wife, daughter and son are planning a yard sale at the end of March and patient is not too keen on it.  He feels its going to be a mess and would rather donate items.    Pertinent History  Patient reports he was diagnosed  with Parkinson's disease about 10 plus years ago.  He reports a more recent decline in function in his daily activities in the last 6-12 months.  He has been seeing PT for the last couple months.    Patient Stated Goals  Patient reports he wants to be able to do more for himself and around the house.     Currently in Pain?  No/denies    Pain Score  0-No pain    Multiple Pain Sites  No                   OT Treatments/Exercises (OP) - 01/01/18 1952      ADLs   ADL Comments  Patient seen for backing up and approaching toilet and chair with cues for taking larger steps to surface.  Patient seen for turning in smaller spaces such as the bathroom and in the elevator with and without people present.  Patient demonstrated freezing when getting onto elevator this date with one person waiting to operate the buttons.  He responded well to a cue to take a big step.        Neurological Re-education Exercises   Other Exercises 1  Patient seen for instruction of  LSVT BIG exercises: LSVT Daily Session Maximal Daily Exercises: Sustained movements are designed to rescale the amplitude of movement output for generalization to daily functional activities. Performed as follows for 1 set of 10 repetitions each: Multi directional sustained movements- 1) Floor to ceiling, 2) Side to side. Multi directional Repetitive movements performed in standing and are designed to provide retraining effort needed for sustained muscle activation in tasks Performed as follows: 3) Step and reach forward, 4) Step and Reach Backwards, 5) Step and reach sideways, 6) Rock and reach forward/backward, 7) Rock and reach sideways.CGA for exercises today, no loss of balance, minimal hesitation at times but responds to verbal cues.     Other Exercises 2  Patient seen for functional mobility for 2 trials of 300 feet with SBA and cues for size of steps and especially with turns.              OT Education - 01/02/18 1955     Education provided  Yes    Education Details  HEP, approaching surfaces    Person(s) Educated  Patient    Methods  Explanation;Demonstration;Verbal cues    Comprehension  Verbal cues required;Returned demonstration;Verbalized understanding          OT Long Term Goals - 11/23/17 1831      OT LONG TERM GOAL #1   Title  Patient will demonstrate increase right hand grip to be able to cut food with modified independence.     Baseline  occasional assist required.    Time  12    Period  Weeks    Status  Partially Met      OT LONG TERM GOAL #2   Title  Patient will complete donning long sleeve shirt with modified independence.    Baseline  requires assist with long sleeves, can perform short sleeves.    Time  12    Period  Weeks    Status  Achieved      OT LONG TERM GOAL #3   Title  Patient will improve coordination to manage buttons on clothing with occasional assistance only.    Baseline  requires assist with buttons each time    Time  12    Period  Weeks    Status  Partially Met      OT LONG TERM GOAL #4   Title  Patient will complete managing pants after toileting with modified independence.     Baseline  assist most days     Time  12    Period  Weeks    Status  Achieved      OT LONG TERM GOAL #5   Title  Patient will demonstrate turning behaviors with rollator using large steps and keeping feet behind the rollator with all turns with occasional minimal cues.     Time  12    Period  Weeks    Status  On-going      OT LONG TERM GOAL #6   Title  Patient will improve gait speed and endurance and be able to walk 950 feet in 6 minutes to negotiate around the home and community safely in 4 weeks    Baseline  860 at reassessment    Time  4    Period  Weeks    Status  Revised      OT LONG TERM GOAL #7   Title  Patient will complete HEP for maximal daily exercises with modified independence in 4 weeks    Time  12  Period  Weeks    Status  Partially Met      OT LONG  TERM GOAL #8   Title  Patient will transfer from sit to stand without the use of arms safely and independently from a variety of chairs/surfaces in 4 weeks.     Baseline  difficulty from lower surfaces    Time  4    Period  Weeks    Status  Achieved      OT LONG TERM GOAL  #9   Baseline  Patient will demonstrate decreased episodes of freezing of behaviors from score of 16 to score of less than 12.    Time  4    Period  Weeks    Status  Achieved            Plan - 01/02/18 1957    Clinical Impression Statement  Patient continues to demonstrate hesitations/freezing of gait noted with stepping backwards with left foot, with turning and when getting onto the elevator at times when he knows someone is waiting.  If he is not rushed, he oftens does well with moving and gait patterns.  Continue to work on skills to reduce risk for falls and improve balance skills.     Occupational Profile and client history currently impacting functional performance  progressive disease process, freezing of gait behaviors, fall risk    Occupational performance deficits (Please refer to evaluation for details):  ADL's;IADL's;Leisure    Rehab Potential  Good    Current Impairments/barriers affecting progress:  positive: family support, negative: level of motivation, progressive disease    OT Frequency  2x / week    OT Duration  12 weeks    OT Treatment/Interventions  Self-care/ADL training;Moist Heat;DME and/or AE instruction;Balance training;Therapeutic activities;Therapeutic exercise;Neuromuscular education;Functional Mobility Training;Patient/family education    Consulted and Agree with Plan of Care  Patient       Patient will benefit from skilled therapeutic intervention in order to improve the following deficits and impairments:  Abnormal gait, Decreased coordination, Decreased range of motion, Difficulty walking, Decreased endurance, Decreased safety awareness, Decreased activity tolerance, Decreased  balance, Impaired UE functional use, Pain, Decreased mobility, Decreased strength  Visit Diagnosis: Difficulty in walking, not elsewhere classified  Unsteadiness on feet  Abnormal posture  Other lack of coordination  Muscle weakness (generalized)    Problem List Patient Active Problem List   Diagnosis Date Noted  . BPH with urinary obstruction 11/14/2016  . Urinary frequency 10/10/2016  . Microscopic hematuria 10/10/2016  . Parkinson's syndrome ([place]) 06/14/2016  . Arthritis 06/14/2016  . Depression 06/14/2016  . Heartburn 06/14/2016  . Urinary incontinence 06/14/2016  . Skin lesion 06/14/2016  . Dementia 06/14/2016  . Bilateral edema of lower extremity 06/14/2016   Achilles Dunk, OTR/L, CLT  Lovett,Amy 01/02/2018, 8:00 PM  [place] MAIN [facility] SERVICES [address] [place], [place], [address] Phone: [phone]   Fax:  [phone]  Name: Patrice Moates MRN: [id number] Date of Birth: [birth year]

## 2018-01-03 ENCOUNTER — Encounter: Payer: Self-pay | Admitting: Occupational Therapy

## 2018-01-03 NOTE — Therapy (Signed)
[place] MAIN [facility] SERVICES [address] [place], [place], [address] Phone: [phone]   Fax:  [phone]  Occupational Therapy Treatment  Patient Details  Name: Luke Durham MRN: [id number] Date of Birth: 04-Dec-1932 No Data Recorded  Encounter Date: 12/29/2017  OT End of Session - 01/02/18 2145    Visit Number  95    Number of Visits  108    Date for OT Re-Evaluation  02/02/18    OT Start Time  1100    OT Stop Time  1200    OT Time Calculation (min)  60 min    Activity Tolerance  Patient tolerated treatment well    Behavior During Therapy  [facility] for tasks assessed/performed       Past Medical History:  Diagnosis Date  . Anxiety   . Arthritis   . Asthma    childhood asthma  . Cancer ([place]) 7 years ago   lymphoma, [place] (recent)  . Chronic kidney disease   . Colon polyps   . GERD (gastroesophageal reflux disease)   . History of kidney stones   . Parkinson disease [facility])     Past Surgical History:  Procedure Laterality Date  . COLON SURGERY    . CYSTOSCOPY WITH LITHOLAPAXY N/A 11/14/2016   Procedure: CYSTOSCOPY WITH LITHOLAPAXY;  Surgeon: Hollice Espy, MD;  Location: [facility] ORS;  Service: Urology;  Laterality: N/A;  . EYE SURGERY Bilateral    Cataract Extraction with IOL  . HERNIA REPAIR  20 years  . MOHS SURGERY    . SMALL INTESTINE SURGERY     Per patient 7 years  . TRANSURETHRAL RESECTION OF PROSTATE N/A 11/14/2016   Procedure: TRANSURETHRAL RESECTION OF THE PROSTATE (TURP);  Surgeon: Hollice Espy, MD;  Location: [facility] ORS;  Service: Urology;  Laterality: N/A;    There were no vitals filed for this visit.  Subjective Assessment - 01/02/18 2144    Subjective   Patient reports he is planning to watch the [facility] game this weekend.     Pertinent History  Patient reports he was diagnosed with Parkinson's disease about 10 plus years ago.  He reports a more recent decline in function in his daily activities in the last  6-12 months.  He has been seeing PT for the last couple months.    Patient Stated Goals  Patient reports he wants to be able to do more for himself and around the house.     Currently in Pain?  No/denies    Pain Score  0-No pain                   OT Treatments/Exercises (OP) - 01/03/18 1825      ADLs   ADL Comments  Patient seen for backing up and approaching toilet and chair with cues for taking larger steps to surface      Neurological Re-education Exercises   Other Exercises 1  Patient seen for instruction of LSVT BIG exercises: LSVT Daily Session Maximal Daily Exercises: Sustained movements are designed to rescale the amplitude of movement output for generalization to daily functional activities. Performed as follows for 1 set of 10 repetitions each: Multi directional sustained movements- 1) Floor to ceiling, 2) Side to side. Multi directional Repetitive movements performed in standing and are designed to provide retraining effort needed for sustained muscle activation in tasks Performed as follows: 3) Step and reach forward, 4) Step and Reach Backwards, 5) Step and reach sideways, 6) Rock and reach  forward/backward, 7) Rock and reach sideways.CGA for exercises today, no loss of balance, minimal hesitation at times but responds to verbal cues.     Other Exercises 2  Patient seen for turning, backing up to surfaces, sit to stand with cues for weight shifting, alternating stepping patterns and working on increasing amplitude of step size and gait.              OT Education - 01/02/18 2145    Education provided  Yes    Education Details  amplitude of steps, turning     Person(s) Educated  Patient    Methods  Explanation;Demonstration;Verbal cues    Comprehension  Verbal cues required;Returned demonstration;Verbalized understanding          OT Long Term Goals - 11/23/17 1831      OT LONG TERM GOAL #1   Title  Patient will demonstrate increase right hand grip to be  able to cut food with modified independence.     Baseline  occasional assist required.    Time  12    Period  Weeks    Status  Partially Met      OT LONG TERM GOAL #2   Title  Patient will complete donning long sleeve shirt with modified independence.    Baseline  requires assist with long sleeves, can perform short sleeves.    Time  12    Period  Weeks    Status  Achieved      OT LONG TERM GOAL #3   Title  Patient will improve coordination to manage buttons on clothing with occasional assistance only.    Baseline  requires assist with buttons each time    Time  12    Period  Weeks    Status  Partially Met      OT LONG TERM GOAL #4   Title  Patient will complete managing pants after toileting with modified independence.     Baseline  assist most days     Time  12    Period  Weeks    Status  Achieved      OT LONG TERM GOAL #5   Title  Patient will demonstrate turning behaviors with rollator using large steps and keeping feet behind the rollator with all turns with occasional minimal cues.     Time  12    Period  Weeks    Status  On-going      OT LONG TERM GOAL #6   Title  Patient will improve gait speed and endurance and be able to walk 950 feet in 6 minutes to negotiate around the home and community safely in 4 weeks    Baseline  860 at reassessment    Time  4    Period  Weeks    Status  Revised      OT LONG TERM GOAL #7   Title  Patient will complete HEP for maximal daily exercises with modified independence in 4 weeks    Time  12    Period  Weeks    Status  Partially Met      OT LONG TERM GOAL #8   Title  Patient will transfer from sit to stand without the use of arms safely and independently from a variety of chairs/surfaces in 4 weeks.     Baseline  difficulty from lower surfaces    Time  4    Period  Weeks    Status  Achieved  OT LONG TERM GOAL  #9   Baseline  Patient will demonstrate decreased episodes of freezing of behaviors from score of 16 to score  of less than 12.    Time  4    Period  Weeks    Status  Achieved            Plan - 01/02/18 2146    Clinical Impression Statement  Patient continues to work towards performing exercises in a standard version with focus on improved balance with exercises.  Patient continues to have episodes at times of freezing/hesistations but responds well to verbal cues. Patient focusing on size of stepping patterns in all directions, especially with backing up to sit down on a surface.     Occupational Profile and client history currently impacting functional performance  progressive disease process, freezing of gait behaviors, fall risk    Occupational performance deficits (Please refer to evaluation for details):  ADL's;IADL's;Leisure    Rehab Potential  Good    Current Impairments/barriers affecting progress:  positive: family support, negative: level of motivation, progressive disease    OT Frequency  2x / week    OT Duration  12 weeks    OT Treatment/Interventions  Self-care/ADL training;Moist Heat;DME and/or AE instruction;Balance training;Therapeutic activities;Therapeutic exercise;Neuromuscular education;Functional Mobility Training;Patient/family education    Consulted and Agree with Plan of Care  Patient       Patient will benefit from skilled therapeutic intervention in order to improve the following deficits and impairments:  Abnormal gait, Decreased coordination, Decreased range of motion, Difficulty walking, Decreased endurance, Decreased safety awareness, Decreased activity tolerance, Decreased balance, Impaired UE functional use, Pain, Decreased mobility, Decreased strength  Visit Diagnosis: Difficulty in walking, not elsewhere classified  Unsteadiness on feet  Abnormal posture  Other lack of coordination  Muscle weakness (generalized)    Problem List Patient Active Problem List   Diagnosis Date Noted  . BPH with urinary obstruction 11/14/2016  . Urinary frequency  10/10/2016  . Microscopic hematuria 10/10/2016  . Parkinson's syndrome ([place]) 06/14/2016  . Arthritis 06/14/2016  . Depression 06/14/2016  . Heartburn 06/14/2016  . Urinary incontinence 06/14/2016  . Skin lesion 06/14/2016  . Dementia 06/14/2016  . Bilateral edema of lower extremity 06/14/2016   Achilles Dunk, OTR/L, CLT  Lovett,Amy 01/03/2018, 6:30 PM  [place] MAIN [facility] SERVICES [address] [place], [place], [address] Phone: [phone]   Fax:  [phone]  Name: Kamir Selover MRN: [id number] Date of Birth: 1933/04/16

## 2018-01-05 ENCOUNTER — Encounter: Payer: Self-pay | Admitting: Occupational Therapy

## 2018-01-05 ENCOUNTER — Ambulatory Visit: Payer: Medicare Other | Admitting: Occupational Therapy

## 2018-01-05 VITALS — BP 101/59

## 2018-01-05 DIAGNOSIS — R278 Other lack of coordination: Secondary | ICD-10-CM

## 2018-01-05 DIAGNOSIS — G8929 Other chronic pain: Secondary | ICD-10-CM

## 2018-01-05 DIAGNOSIS — R2681 Unsteadiness on feet: Secondary | ICD-10-CM

## 2018-01-05 DIAGNOSIS — R262 Difficulty in walking, not elsewhere classified: Secondary | ICD-10-CM

## 2018-01-05 DIAGNOSIS — M25511 Pain in right shoulder: Secondary | ICD-10-CM

## 2018-01-05 DIAGNOSIS — R293 Abnormal posture: Secondary | ICD-10-CM

## 2018-01-05 DIAGNOSIS — M6281 Muscle weakness (generalized): Secondary | ICD-10-CM

## 2018-01-05 NOTE — Therapy (Signed)
[place] MAIN [facility] SERVICES [address] [place], [place], [address] Phone: [phone]   Fax:  [phone]  Occupational Therapy Treatment  Patient Details  Name: Luke Durham MRN: [id number] Date of Birth: 15-Oct-1933 No Data Recorded  Encounter Date: 01/02/2018    Past Medical History:  Diagnosis Date  . Anxiety   . Arthritis   . Asthma    childhood asthma  . Cancer ([place]) 7 years ago   lymphoma, [place] (recent)  . Chronic kidney disease   . Colon polyps   . GERD (gastroesophageal reflux disease)   . History of kidney stones   . Parkinson disease [facility])     Past Surgical History:  Procedure Laterality Date  . COLON SURGERY    . CYSTOSCOPY WITH LITHOLAPAXY N/A 11/14/2016   Procedure: CYSTOSCOPY WITH LITHOLAPAXY;  Surgeon: Hollice Espy, MD;  Location: [facility] ORS;  Service: Urology;  Laterality: N/A;  . EYE SURGERY Bilateral    Cataract Extraction with IOL  . HERNIA REPAIR  20 years  . MOHS SURGERY    . SMALL INTESTINE SURGERY     Per patient 7 years  . TRANSURETHRAL RESECTION OF PROSTATE N/A 11/14/2016   Procedure: TRANSURETHRAL RESECTION OF THE PROSTATE (TURP);  Surgeon: Hollice Espy, MD;  Location: [facility] ORS;  Service: Urology;  Laterality: N/A;    There were no vitals filed for this visit.  Subjective Assessment - 01/05/18 1039    Subjective   Fall in closet just before therapy, denies any injury except increased pain in right shoulder 7/10, does not feel he needs to go to the doctor.  Was putting away clothing, walker does not fit into closet, turned and feel on his behind was able to get up with use of foot stool to push up on.  Denies any other pain except right shoulder.     Pertinent History  Patient reports he was diagnosed with Parkinson's disease about 10 plus years ago.  He reports a more recent decline in function in his daily activities in the last 6-12 months.  He has been seeing PT for the last couple months.    Patient Stated Goals  Patient reports he wants to be able to do more for himself and around the house.     Currently in Pain?  Yes    Pain Score  7     Pain Location  Shoulder    Pain Orientation  Right    Pain Descriptors / Indicators  Aching    Pain Type  Acute pain;Chronic pain    Pain Onset  Today    Multiple Pain Sites  No         Patient seen for moist heat to right shoulder this date followed by manual therapy techniques for mobilization of the right shoulder for scapular elevation, retraction and upwards rotation followed by gentle A/AAROM of shoulder in all planes to reduce pain and increase motion. Patient instructed on fall risk, behaviors, use of walker and positioning of self to participate in tasks.  Patient also seen for functional mobility skills this date with rollator, with emphasis on managing in small spaces.  Pain improved by end of session from 7/10 to 3/10 .                  OT Education - 01/05/18 1039    Education provided  Yes    Education Details  balance, fall recovery, pain    Person(s) Educated  Patient  Methods  Explanation;Demonstration;Verbal cues    Comprehension  Verbal cues required;Returned demonstration;Verbalized understanding          OT Long Term Goals - 11/23/17 1831      OT LONG TERM GOAL #1   Title  Patient will demonstrate increase right hand grip to be able to cut food with modified independence.     Baseline  occasional assist required.    Time  12    Period  Weeks    Status  Partially Met      OT LONG TERM GOAL #2   Title  Patient will complete donning long sleeve shirt with modified independence.    Baseline  requires assist with long sleeves, can perform short sleeves.    Time  12    Period  Weeks    Status  Achieved      OT LONG TERM GOAL #3   Title  Patient will improve coordination to manage buttons on clothing with occasional assistance only.    Baseline  requires assist with buttons each time     Time  12    Period  Weeks    Status  Partially Met      OT LONG TERM GOAL #4   Title  Patient will complete managing pants after toileting with modified independence.     Baseline  assist most days     Time  12    Period  Weeks    Status  Achieved      OT LONG TERM GOAL #5   Title  Patient will demonstrate turning behaviors with rollator using large steps and keeping feet behind the rollator with all turns with occasional minimal cues.     Time  12    Period  Weeks    Status  On-going      OT LONG TERM GOAL #6   Title  Patient will improve gait speed and endurance and be able to walk 950 feet in 6 minutes to negotiate around the home and community safely in 4 weeks    Baseline  860 at reassessment    Time  4    Period  Weeks    Status  Revised      OT LONG TERM GOAL #7   Title  Patient will complete HEP for maximal daily exercises with modified independence in 4 weeks    Time  12    Period  Weeks    Status  Partially Met      OT LONG TERM GOAL #8   Title  Patient will transfer from sit to stand without the use of arms safely and independently from a variety of chairs/surfaces in 4 weeks.     Baseline  difficulty from lower surfaces    Time  4    Period  Weeks    Status  Achieved      OT LONG TERM GOAL  #9   Baseline  Patient will demonstrate decreased episodes of freezing of behaviors from score of 16 to score of less than 12.    Time  4    Period  Weeks    Status  Achieved            Plan - 01/05/18 1040    Clinical Impression Statement  Patient reports a fall this morning at home prior to coming to therapy.  He reports he was reaching in the closet to put away an item and lost his balance and fell.  He did  not have his walker since it does not fit into the closet space, he was able to get up by pushing up on a foot stool.  He reports his right shoulder which normally has some pain hurts worse today after the fall, he denies the need to go to the doctor.  Utilized  moist heat this date and performed A/AAROM exercises to R shoulder and arm and patient reported it felt much better by the end of the  session.  Will continue to work to reduce fall risk and will analyze with patient what occurred and see if there are modifications we can make to further reduce the risk of falls at home with these tasks. Patient to monitor pain and call MD or seek medical care if it worsens.     Occupational Profile and client history currently impacting functional performance  progressive disease process, freezing of gait behaviors, fall risk    Occupational performance deficits (Please refer to evaluation for details):  ADL's;IADL's;Leisure    Rehab Potential  Good    Current Impairments/barriers affecting progress:  positive: family support, negative: level of motivation, progressive disease    OT Frequency  2x / week    OT Duration  12 weeks    OT Treatment/Interventions  Self-care/ADL training;Moist Heat;DME and/or AE instruction;Balance training;Therapeutic activities;Therapeutic exercise;Neuromuscular education;Functional Mobility Training;Patient/family education    Consulted and Agree with Plan of Care  Patient       Patient will benefit from skilled therapeutic intervention in order to improve the following deficits and impairments:  Abnormal gait, Decreased coordination, Decreased range of motion, Difficulty walking, Decreased endurance, Decreased safety awareness, Decreased activity tolerance, Decreased balance, Impaired UE functional use, Pain, Decreased mobility, Decreased strength  Visit Diagnosis: Difficulty in walking, not elsewhere classified  Unsteadiness on feet  Abnormal posture  Other lack of coordination  Muscle weakness (generalized)  Chronic right shoulder pain    Problem List Patient Active Problem List   Diagnosis Date Noted  . BPH with urinary obstruction 11/14/2016  . Urinary frequency 10/10/2016  . Microscopic hematuria 10/10/2016  .  Parkinson's syndrome ([place]) 06/14/2016  . Arthritis 06/14/2016  . Depression 06/14/2016  . Heartburn 06/14/2016  . Urinary incontinence 06/14/2016  . Skin lesion 06/14/2016  . Dementia 06/14/2016  . Bilateral edema of lower extremity 06/14/2016   Achilles Dunk, OTR/L, CLT  Lovett,Amy 01/05/2018, 10:44 AM  [place] MAIN [facility] SERVICES [address] [place], [place], [address] Phone: [phone]   Fax:  [phone]  Name: Hilding Quintanar MRN: [id number] Date of Birth: 08/18/1933

## 2018-01-06 NOTE — Therapy (Signed)
[place] MAIN [facility] SERVICES [address] [place], [place], [address] Phone: [phone]   Fax:  [phone]  Occupational Therapy Treatment  Patient Details  Name: Luke Durham MRN: [id number] Date of Birth: June 10, 1933 No Data Recorded  Encounter Date: 01/05/2018  OT End of Session - 01/05/18 1119    Visit Number  97    Number of Visits  108    Date for OT Re-Evaluation  02/02/18    OT Start Time  1103    OT Stop Time  1200    OT Time Calculation (min)  57 min    Activity Tolerance  Patient tolerated treatment well    Behavior During Therapy  [facility] for tasks assessed/performed       Past Medical History:  Diagnosis Date  . Anxiety   . Arthritis   . Asthma    childhood asthma  . Cancer ([place]) 7 years ago   lymphoma, [place] (recent)  . Chronic kidney disease   . Colon polyps   . GERD (gastroesophageal reflux disease)   . History of kidney stones   . Parkinson disease [facility])     Past Surgical History:  Procedure Laterality Date  . COLON SURGERY    . CYSTOSCOPY WITH LITHOLAPAXY N/A 11/14/2016   Procedure: CYSTOSCOPY WITH LITHOLAPAXY;  Surgeon: Hollice Espy, MD;  Location: [facility] ORS;  Service: Urology;  Laterality: N/A;  . EYE SURGERY Bilateral    Cataract Extraction with IOL  . HERNIA REPAIR  20 years  . MOHS SURGERY    . SMALL INTESTINE SURGERY     Per patient 7 years  . TRANSURETHRAL RESECTION OF PROSTATE N/A 11/14/2016   Procedure: TRANSURETHRAL RESECTION OF THE PROSTATE (TURP);  Surgeon: Hollice Espy, MD;  Location: [facility] ORS;  Service: Urology;  Laterality: N/A;    Vitals:   01/05/18 1118  BP: (!) 101/59    Subjective Assessment - 01/05/18 1118    Subjective   Patient still has some increased shoulder pain on the right after fall but reports it is getting better.      Pertinent History  Patient reports he was diagnosed with Parkinson's disease about 10 plus years ago.  He reports a more recent decline in function in  his daily activities in the last 6-12 months.  He has been seeing PT for the last couple months.    Patient Stated Goals  Patient reports he wants to be able to do more for himself and around the house.     Currently in Pain?  Yes    Pain Score  6     Pain Location  Shoulder    Pain Orientation  Right    Pain Descriptors / Indicators  Aching    Pain Onset  In the past 7 days    Pain Frequency  Intermittent                   OT Treatments/Exercises (OP) - 01/06/18 0001      ADLs   ADL Comments  Patient seen for sit to stand transitions from a variety of surfaces, approaching spaces with rollator and preparing to sit with cues for large steps to back up to surface.       Neurological Re-education Exercises   Other Exercises 1  Patient seen for instruction of LSVT BIG exercises: LSVT Daily Session Maximal Daily Exercises: Sustained movements are designed to rescale the amplitude of movement output for generalization to daily functional activities. Performed as  follows for 1 set of 10 repetitions each: Multi directional sustained movements- 1) Floor to ceiling, 2) Side to side. Multi directional Repetitive movements performed in standing and are designed to provide retraining effort needed for sustained muscle activation in tasks Performed as follows: 3) Step and reach forward, 4) Step and Reach Backwards, 5) Step and reach sideways, 6) Rock and reach forward/backward, 7) Rock and reach sideways.CGA for exercises today, no loss of balance, minimal hesitation at times but responds to verbal cues.     Other Exercises 2  Patient seen for posture exercises at the wall with manual cues from therapist for shoulder retraction and head/neck position.  Patient performing exercises with small 1000 g ball to raise and lower to improve posture.              OT Education - 01/05/18 1119    Education provided  Yes    Education Details  fall recovery, ROM, amplitude of gait    Person(s)  Educated  Patient    Methods  Explanation;Demonstration;Verbal cues    Comprehension  Verbal cues required;Returned demonstration;Verbalized understanding          OT Long Term Goals - 11/23/17 1831      OT LONG TERM GOAL #1   Title  Patient will demonstrate increase right hand grip to be able to cut food with modified independence.     Baseline  occasional assist required.    Time  12    Period  Weeks    Status  Partially Met      OT LONG TERM GOAL #2   Title  Patient will complete donning long sleeve shirt with modified independence.    Baseline  requires assist with long sleeves, can perform short sleeves.    Time  12    Period  Weeks    Status  Achieved      OT LONG TERM GOAL #3   Title  Patient will improve coordination to manage buttons on clothing with occasional assistance only.    Baseline  requires assist with buttons each time    Time  12    Period  Weeks    Status  Partially Met      OT LONG TERM GOAL #4   Title  Patient will complete managing pants after toileting with modified independence.     Baseline  assist most days     Time  12    Period  Weeks    Status  Achieved      OT LONG TERM GOAL #5   Title  Patient will demonstrate turning behaviors with rollator using large steps and keeping feet behind the rollator with all turns with occasional minimal cues.     Time  12    Period  Weeks    Status  On-going      OT LONG TERM GOAL #6   Title  Patient will improve gait speed and endurance and be able to walk 950 feet in 6 minutes to negotiate around the home and community safely in 4 weeks    Baseline  860 at reassessment    Time  4    Period  Weeks    Status  Revised      OT LONG TERM GOAL #7   Title  Patient will complete HEP for maximal daily exercises with modified independence in 4 weeks    Time  12    Period  Weeks    Status  Partially Met  OT LONG TERM GOAL #8   Title  Patient will transfer from sit to stand without the use of arms  safely and independently from a variety of chairs/surfaces in 4 weeks.     Baseline  difficulty from lower surfaces    Time  4    Period  Weeks    Status  Achieved      OT LONG TERM GOAL  #9   Baseline  Patient will demonstrate decreased episodes of freezing of behaviors from score of 16 to score of less than 12.    Time  4    Period  Weeks    Status  Achieved            Plan - 01/05/18 1120    Clinical Impression Statement  Patient still has some increased shoulder pain on the right after fall but reports it is getting better.  Patient continues to work on daily exercises, balance and reducing fall risk. Right shoulder pain decreased after session this date and patient appears back to baseline.  Continue to work towards goals to improve independence and safety in daily tasks.     Occupational Profile and client history currently impacting functional performance  progressive disease process, freezing of gait behaviors, fall risk    Occupational performance deficits (Please refer to evaluation for details):  ADL's;IADL's;Leisure    Rehab Potential  Good    Current Impairments/barriers affecting progress:  positive: family support, negative: level of motivation, progressive disease    OT Frequency  2x / week    OT Duration  12 weeks    OT Treatment/Interventions  Self-care/ADL training;Moist Heat;DME and/or AE instruction;Balance training;Therapeutic activities;Therapeutic exercise;Neuromuscular education;Functional Mobility Training;Patient/family education    Consulted and Agree with Plan of Care  Patient       Patient will benefit from skilled therapeutic intervention in order to improve the following deficits and impairments:  Abnormal gait, Decreased coordination, Decreased range of motion, Difficulty walking, Decreased endurance, Decreased safety awareness, Decreased activity tolerance, Decreased balance, Impaired UE functional use, Pain, Decreased mobility, Decreased  strength  Visit Diagnosis: Difficulty in walking, not elsewhere classified  Unsteadiness on feet  Abnormal posture  Other lack of coordination  Muscle weakness (generalized)  Chronic right shoulder pain    Problem List Patient Active Problem List   Diagnosis Date Noted  . BPH with urinary obstruction 11/14/2016  . Urinary frequency 10/10/2016  . Microscopic hematuria 10/10/2016  . Parkinson's syndrome ([place]) 06/14/2016  . Arthritis 06/14/2016  . Depression 06/14/2016  . Heartburn 06/14/2016  . Urinary incontinence 06/14/2016  . Skin lesion 06/14/2016  . Dementia 06/14/2016  . Bilateral edema of lower extremity 06/14/2016   Achilles Dunk, OTR/L, CLT  Lovett,Amy 01/06/2018, 1:43 PM  [place] MAIN [facility] SERVICES [address] [place], [place], [address] Phone: [phone]   Fax:  [phone]  Name: Luke Durham MRN: [id number] Date of Birth: 1933-04-19

## 2018-01-08 ENCOUNTER — Ambulatory Visit: Payer: Medicare Other | Admitting: Occupational Therapy

## 2018-01-08 DIAGNOSIS — M6281 Muscle weakness (generalized): Secondary | ICD-10-CM

## 2018-01-08 DIAGNOSIS — R2681 Unsteadiness on feet: Secondary | ICD-10-CM

## 2018-01-08 DIAGNOSIS — R278 Other lack of coordination: Secondary | ICD-10-CM

## 2018-01-08 DIAGNOSIS — R262 Difficulty in walking, not elsewhere classified: Secondary | ICD-10-CM

## 2018-01-08 DIAGNOSIS — R293 Abnormal posture: Secondary | ICD-10-CM

## 2018-01-09 ENCOUNTER — Ambulatory Visit (INDEPENDENT_AMBULATORY_CARE_PROVIDER_SITE_OTHER): Payer: Medicare Other | Admitting: Physician Assistant

## 2018-01-09 DIAGNOSIS — Z87448 Personal history of other diseases of urinary system: Secondary | ICD-10-CM

## 2018-01-09 DIAGNOSIS — Z23 Encounter for immunization: Secondary | ICD-10-CM | POA: Diagnosis not present

## 2018-01-09 MED ORDER — DOCUSATE SODIUM 100 MG PO CAPS: 100.0000 mg | ORAL_CAPSULE | Freq: Two times a day (BID) | ORAL | 11 refills | Status: DC

## 2018-01-09 NOTE — Progress Notes (Signed)
Pneumovax Vaccine given to patient without complications. Patient sat for 15 minutes after administration and was tolerated well without adverse effects.

## 2018-01-12 ENCOUNTER — Ambulatory Visit: Payer: Medicare Other | Admitting: Occupational Therapy

## 2018-01-12 ENCOUNTER — Encounter: Payer: Self-pay | Admitting: Occupational Therapy

## 2018-01-12 DIAGNOSIS — R262 Difficulty in walking, not elsewhere classified: Secondary | ICD-10-CM

## 2018-01-12 DIAGNOSIS — R278 Other lack of coordination: Secondary | ICD-10-CM

## 2018-01-12 DIAGNOSIS — M6281 Muscle weakness (generalized): Secondary | ICD-10-CM

## 2018-01-12 DIAGNOSIS — R293 Abnormal posture: Secondary | ICD-10-CM

## 2018-01-12 DIAGNOSIS — R2681 Unsteadiness on feet: Secondary | ICD-10-CM

## 2018-01-12 NOTE — Therapy (Signed)
[place] MAIN [facility] SERVICES [address] [place], [place], [address] Phone: [phone]   Fax:  [phone]  Occupational Therapy Treatment  Patient Details  Name: Luke Durham MRN: [id number] Date of Birth: [birth year] No data recorded  Encounter Date: 01/08/2018  OT End of Session - 01/12/18 1009    Visit Number  98    Number of Visits  108    Date for OT Re-Evaluation  02/02/18    OT Start Time  1100    OT Stop Time  1159    OT Time Calculation (min)  59 min    Activity Tolerance  Patient tolerated treatment well    Behavior During Therapy  [facility] for tasks assessed/performed       Past Medical History:  Diagnosis Date  . Anxiety   . Arthritis   . Asthma    childhood asthma  . Cancer ([place]) 7 years ago   lymphoma, [place] (recent)  . Chronic kidney disease   . Colon polyps   . GERD (gastroesophageal reflux disease)   . History of kidney stones   . Parkinson disease [facility])     Past Surgical History:  Procedure Laterality Date  . COLON SURGERY    . CYSTOSCOPY WITH LITHOLAPAXY N/A 11/14/2016   Procedure: CYSTOSCOPY WITH LITHOLAPAXY;  Surgeon: Hollice Espy, MD;  Location: [facility] ORS;  Service: Urology;  Laterality: N/A;  . EYE SURGERY Bilateral    Cataract Extraction with IOL  . HERNIA REPAIR  20 years  . MOHS SURGERY    . SMALL INTESTINE SURGERY     Per patient 7 years  . TRANSURETHRAL RESECTION OF PROSTATE N/A 11/14/2016   Procedure: TRANSURETHRAL RESECTION OF THE PROSTATE (TURP);  Surgeon: Hollice Espy, MD;  Location: [facility] ORS;  Service: Urology;  Laterality: N/A;    There were no vitals filed for this visit.  Subjective Assessment - 01/12/18 1003    Subjective   Patient reports his shoulder is feeling better now.    Pertinent History  Patient reports he was diagnosed with Parkinson's disease about 10 plus years ago.  He reports a more recent decline in function in his daily activities in the last 6-12 months.  He has been  seeing PT for the last couple months.    Patient Stated Goals  Patient reports he wants to be able to do more for himself and around the house.     Currently in Pain?  No/denies    Pain Score  0-No pain                   OT Treatments/Exercises (OP) - 01/11/18 1004      Neurological Re-education Exercises   Other Exercises 1  Patient seen for instruction of LSVT BIG exercises: LSVT Daily Session Maximal Daily Exercises: Sustained movements are designed to rescale the amplitude of movement output for generalization to daily functional activities. Performed as follows for 1 set of 10 repetitions each: Multi directional sustained movements- 1) Floor to ceiling, 2) Side to side. Multi directional Repetitive movements performed in standing and are designed to provide retraining effort needed for sustained muscle activation in tasks Performed as follows: 3) Step and reach forward, 4) Step and Reach Backwards, 5) Step and reach sideways, 6) Rock and reach forward/backward, 7) Rock and reach sideways. CGA in standing for exercises for balance, patient occasionally reaching out to touch chair as needed.  Greatest difficulty with stepping backwards especially on the right side.  Other Exercises 2  Patient seen for posture exercises on the wall with cues and manual stretch for 30 second hold.  Patient performing crossed leg to reach to feet for putting on socks and shoes.  Patient performing functional mobility skills with rollator and SBA, cues provided for nagivating in crowded areas and small spaces.  Focused on turns large and small spaces.             OT Education - 01/12/18 1008    Education provided  Yes    Education Details  turning behaviors    Person(s) Educated  Patient    Methods  Explanation;Demonstration;Verbal cues    Comprehension  Verbal cues required;Returned demonstration;Verbalized understanding          OT Long Term Goals - 11/23/17 1831      OT LONG TERM  GOAL #1   Title  Patient will demonstrate increase right hand grip to be able to cut food with modified independence.     Baseline  occasional assist required.    Time  12    Period  Weeks    Status  Partially Met      OT LONG TERM GOAL #2   Title  Patient will complete donning long sleeve shirt with modified independence.    Baseline  requires assist with long sleeves, can perform short sleeves.    Time  12    Period  Weeks    Status  Achieved      OT LONG TERM GOAL #3   Title  Patient will improve coordination to manage buttons on clothing with occasional assistance only.    Baseline  requires assist with buttons each time    Time  12    Period  Weeks    Status  Partially Met      OT LONG TERM GOAL #4   Title  Patient will complete managing pants after toileting with modified independence.     Baseline  assist most days     Time  12    Period  Weeks    Status  Achieved      OT LONG TERM GOAL #5   Title  Patient will demonstrate turning behaviors with rollator using large steps and keeping feet behind the rollator with all turns with occasional minimal cues.     Time  12    Period  Weeks    Status  On-going      OT LONG TERM GOAL #6   Title  Patient will improve gait speed and endurance and be able to walk 950 feet in 6 minutes to negotiate around the home and community safely in 4 weeks    Baseline  860 at reassessment    Time  4    Period  Weeks    Status  Revised      OT LONG TERM GOAL #7   Title  Patient will complete HEP for maximal daily exercises with modified independence in 4 weeks    Time  12    Period  Weeks    Status  Partially Met      OT LONG TERM GOAL #8   Title  Patient will transfer from sit to stand without the use of arms safely and independently from a variety of chairs/surfaces in 4 weeks.     Baseline  difficulty from lower surfaces    Time  4    Period  Weeks    Status  Achieved  OT LONG TERM GOAL  #9   Baseline  Patient will  demonstrate decreased episodes of freezing of behaviors from score of 16 to score of less than 12.    Time  4    Period  Weeks    Status  Achieved            Plan - 01/12/18 1009    Clinical Impression Statement  Patient reports shoulder pain is back to normal after fall last week.  BP was low initially today, 79/53.  Patient denied any lightheadness or dizziness, exercises started in sitting and BP monitored and increased to 109/63 advanced to exercises in standing with assist, focused on increasing amplitude of movement, moving in crowded spaces and decreasing freezing of gait.  CGA to SBA for all exercises in standing.  Advised patient to continue to monitor BP and to call MD if BP continues to be low. Patient continues to benefit from skilled OT to increase independence in daily tasks.     Occupational Profile and client history currently impacting functional performance  progressive disease process, freezing of gait behaviors, fall risk    Occupational performance deficits (Please refer to evaluation for details):  ADL's;IADL's;Leisure    Rehab Potential  Good    Current Impairments/barriers affecting progress:  positive: family support, negative: level of motivation, progressive disease    OT Frequency  2x / week    OT Duration  12 weeks    OT Treatment/Interventions  Self-care/ADL training;Moist Heat;DME and/or AE instruction;Balance training;Therapeutic activities;Therapeutic exercise;Neuromuscular education;Functional Mobility Training;Patient/family education    Consulted and Agree with Plan of Care  Patient       Patient will benefit from skilled therapeutic intervention in order to improve the following deficits and impairments:  Abnormal gait, Decreased coordination, Decreased range of motion, Difficulty walking, Decreased endurance, Decreased safety awareness, Decreased activity tolerance, Decreased balance, Impaired UE functional use, Pain, Decreased mobility, Decreased  strength  Visit Diagnosis: Difficulty in walking, not elsewhere classified  Unsteadiness on feet  Abnormal posture  Other lack of coordination  Muscle weakness (generalized)    Problem List Patient Active Problem List   Diagnosis Date Noted  . BPH with urinary obstruction 11/14/2016  . Urinary frequency 10/10/2016  . Microscopic hematuria 10/10/2016  . Parkinson's syndrome ([place]) 06/14/2016  . Arthritis 06/14/2016  . Depression 06/14/2016  . Heartburn 06/14/2016  . Urinary incontinence 06/14/2016  . Skin lesion 06/14/2016  . Dementia 06/14/2016  . Bilateral edema of lower extremity 06/14/2016   Achilles Dunk, OTR/L, CLT  Lovett,Amy 01/12/2018, 10:15 AM  [place] MAIN [facility] SERVICES [address] [place], [place], [address] Phone: [phone]   Fax:  [phone]  Name: Luke Durham MRN: [id number] Date of Birth: 1933/01/17

## 2018-01-16 ENCOUNTER — Ambulatory Visit: Payer: Medicare Other | Admitting: Occupational Therapy

## 2018-01-16 DIAGNOSIS — R2681 Unsteadiness on feet: Secondary | ICD-10-CM

## 2018-01-16 DIAGNOSIS — M6281 Muscle weakness (generalized): Secondary | ICD-10-CM

## 2018-01-16 DIAGNOSIS — R293 Abnormal posture: Secondary | ICD-10-CM

## 2018-01-16 DIAGNOSIS — R262 Difficulty in walking, not elsewhere classified: Secondary | ICD-10-CM | POA: Diagnosis not present

## 2018-01-16 DIAGNOSIS — R278 Other lack of coordination: Secondary | ICD-10-CM

## 2018-01-19 ENCOUNTER — Ambulatory Visit: Payer: Medicare Other | Admitting: Occupational Therapy

## 2018-01-19 VITALS — BP 110/60

## 2018-01-19 DIAGNOSIS — M6281 Muscle weakness (generalized): Secondary | ICD-10-CM

## 2018-01-19 DIAGNOSIS — R262 Difficulty in walking, not elsewhere classified: Secondary | ICD-10-CM

## 2018-01-19 DIAGNOSIS — R278 Other lack of coordination: Secondary | ICD-10-CM

## 2018-01-19 DIAGNOSIS — R2681 Unsteadiness on feet: Secondary | ICD-10-CM

## 2018-01-19 DIAGNOSIS — R293 Abnormal posture: Secondary | ICD-10-CM

## 2018-01-20 ENCOUNTER — Encounter: Payer: Self-pay | Admitting: Occupational Therapy

## 2018-01-20 NOTE — Therapy (Signed)
[place] MAIN [facility] SERVICES [address] [place], [place], [address] Phone: [phone]   Fax:  [phone]  Occupational Therapy Treatment  Patient Details  Name: Luke Durham MRN: [id number] Date of Birth: [birth year] No data recorded  Encounter Date: 01/19/2018  OT End of Session - 01/20/18 1933    Visit Number  101    Number of Visits  108    Date for OT Re-Evaluation  02/02/18    OT Start Time  1045    OT Stop Time  1146    OT Time Calculation (min)  61 min    Activity Tolerance  Patient tolerated treatment well    Behavior During Therapy  [facility] for tasks assessed/performed       Past Medical History:  Diagnosis Date  . Anxiety   . Arthritis   . Asthma    childhood asthma  . Cancer ([place]) 7 years ago   lymphoma, [place] (recent)  . Chronic kidney disease   . Colon polyps   . GERD (gastroesophageal reflux disease)   . History of kidney stones   . Parkinson disease [facility])     Past Surgical History:  Procedure Laterality Date  . COLON SURGERY    . CYSTOSCOPY WITH LITHOLAPAXY N/A 11/14/2016   Procedure: CYSTOSCOPY WITH LITHOLAPAXY;  Surgeon: Hollice Espy, MD;  Location: [facility] ORS;  Service: Urology;  Laterality: N/A;  . EYE SURGERY Bilateral    Cataract Extraction with IOL  . HERNIA REPAIR  20 years  . MOHS SURGERY    . SMALL INTESTINE SURGERY     Per patient 7 years  . TRANSURETHRAL RESECTION OF PROSTATE N/A 11/14/2016   Procedure: TRANSURETHRAL RESECTION OF THE PROSTATE (TURP);  Surgeon: Hollice Espy, MD;  Location: [facility] ORS;  Service: Urology;  Laterality: N/A;    Vitals:   01/20/18 1931  BP: 110/60    Subjective Assessment - 01/20/18 1931    Subjective   Patient reports he went to see the neurologist yesterday and they were able to adjust his medications and she would like for his blood pressure to not be below 100.     Pertinent History  Patient reports he was diagnosed with Parkinson's disease about 10 plus years  ago.  He reports a more recent decline in function in his daily activities in the last 6-12 months.  He has been seeing PT for the last couple months.    Patient Stated Goals  Patient reports he wants to be able to do more for himself and around the house.     Currently in Pain?  No/denies    Pain Score  0-No pain    Multiple Pain Sites  No                   OT Treatments/Exercises (OP) - 01/20/18 1931      Neurological Re-education Exercises   Other Exercises 1  Patient seen for instruction of LSVT BIG exercises: LSVT Daily Session Maximal Daily Exercises: Sustained movements are designed to rescale the amplitude of movement output for generalization to daily functional activities. Performed as follows for 1 set of 10 repetitions each: Multi directional sustained movements- 1) Floor to ceiling, 2) Side to side. Multi directional Repetitive movements performed in standing and are designed to provide retraining effort needed for sustained muscle activation in tasks Performed as follows: 3) Step and reach forward, 4) Step and Reach Backwards, 5) Step and reach sideways, 6) Rock and reach forward/backward, 7)  Rock and reach sideways. CGA in standing for exercises for balance, patient occasionally reaching out to touch chair as needed.      Other Exercises 2  Patient was seen this date for focus on amplitude of gait with functional mobility in non-crowded spaces with cues for signs of stepping patterns. Increased cues when turning in smaller spaces however only minimal cues with turning in large spaces. Patient tends to revert to smaller steps when trying to maneuver between two objects placed closely together. He also demonstrates increased Freezing of gait and more populated areas and with distractions.             OT Education - 01/20/18 1932    Education provided  Yes    Education Details  freezing of gait, Copy) Educated  Patient    Methods   Explanation;Verbal cues;Demonstration    Comprehension  Verbalized understanding;Returned demonstration;Verbal cues required          OT Long Term Goals - 01/20/18 1913      OT LONG TERM GOAL #1   Title  Patient will demonstrate increase right hand grip to be able to cut food with modified independence.     Baseline  occasional assist required.    Time  12    Period  Weeks    Status  Partially Met      OT LONG TERM GOAL #2   Title  Patient will complete donning long sleeve shirt with modified independence.    Baseline  requires assist with long sleeves, can perform short sleeves.    Time  12    Period  Weeks    Status  Achieved      OT LONG TERM GOAL #3   Title  Patient will improve coordination to manage buttons on clothing with occasional assistance only.    Baseline  requires assist with buttons each time    Time  12    Period  Weeks    Status  Partially Met      OT LONG TERM GOAL #4   Title  Patient will complete managing pants after toileting with modified independence.     Baseline  assist most days     Time  12    Period  Weeks    Status  Achieved      OT LONG TERM GOAL #5   Title  Patient will demonstrate turning behaviors with rollator using large steps and keeping feet behind the rollator with all turns with occasional minimal cues.     Time  12    Period  Weeks    Status  On-going      OT LONG TERM GOAL #6   Title  Patient will improve gait speed and endurance and be able to walk 950 feet in 6 minutes to negotiate around the home and community safely in 4 weeks    Baseline  860 at reassessment    Time  4    Period  Weeks    Status  Revised      OT LONG TERM GOAL #7   Title  Patient will complete HEP for maximal daily exercises with modified independence in 4 weeks    Time  12    Period  Weeks    Status  Partially Met      OT LONG TERM GOAL #8   Title  Patient will transfer from sit to stand without the use of arms safely and independently from  a  variety of chairs/surfaces in 4 weeks.     Baseline  difficulty from lower surfaces    Time  4    Period  Weeks    Status  Achieved      OT LONG TERM GOAL  #9   Baseline  Patient will demonstrate decreased episodes of freezing of behaviors from score of 16 to score of less than 12.    Time  4    Period  Weeks    Status  Achieved            Plan - 01/20/18 1933    Clinical Impression Statement  Patient demonstrates difficulty with getting in and out of the elevator with evidence of freezing of gait when crossing the threshold to the elevator. This behavior is increased when others are present and patient feels rushed or that others are waiting on him. Provided cues this day for managing in these situations to try to diminish freezing and ensure continuous motion.     Occupational Profile and client history currently impacting functional performance  progressive disease process, freezing of gait behaviors, fall risk    Occupational performance deficits (Please refer to evaluation for details):  ADL's;IADL's;Leisure    Rehab Potential  Good    Current Impairments/barriers affecting progress:  positive: family support, negative: level of motivation, progressive disease    OT Frequency  2x / week    OT Duration  12 weeks    OT Treatment/Interventions  Self-care/ADL training;Moist Heat;DME and/or AE instruction;Balance training;Therapeutic activities;Therapeutic exercise;Neuromuscular education;Functional Mobility Training;Patient/family education    Consulted and Agree with Plan of Care  Patient    Family Member Consulted  wife       Patient will benefit from skilled therapeutic intervention in order to improve the following deficits and impairments:  Abnormal gait, Decreased coordination, Decreased range of motion, Difficulty walking, Decreased endurance, Decreased safety awareness, Decreased activity tolerance, Decreased balance, Impaired UE functional use, Pain, Decreased mobility,  Decreased strength  Visit Diagnosis: Unsteadiness on feet  Abnormal posture  Difficulty in walking, not elsewhere classified  Other lack of coordination  Muscle weakness (generalized)    Problem List Patient Active Problem List   Diagnosis Date Noted  . BPH with urinary obstruction 11/14/2016  . Urinary frequency 10/10/2016  . Microscopic hematuria 10/10/2016  . Parkinson's syndrome ([place]) 06/14/2016  . Arthritis 06/14/2016  . Depression 06/14/2016  . Heartburn 06/14/2016  . Urinary incontinence 06/14/2016  . Skin lesion 06/14/2016  . Dementia 06/14/2016  . Bilateral edema of lower extremity 06/14/2016   Luke Dunk, OTR/L, CLT  Lovett,Amy 01/20/2018, 7:35 PM  [place] MAIN [facility] SERVICES [address] [place], [place], [address] Phone: [phone]   Fax:  [phone]  Name: Luke Durham MRN: [id number] Date of Birth: 05/14/1933

## 2018-01-20 NOTE — Therapy (Signed)
[place] MAIN [facility] SERVICES [address] [place], [place], [address] Phone: [phone]   Fax:  [phone]  Occupational Therapy Treatment  Patient Details  Name: Luke Durham MRN: [id number] Date of Birth: January 10, 1933 No data recorded  Encounter Date: 01/16/2018  OT End of Session - 01/20/18 1912    Visit Number  100    Number of Visits  108    Date for OT Re-Evaluation  02/02/18    OT Start Time  1400    OT Stop Time  1459    OT Time Calculation (min)  59 min    Activity Tolerance  Patient tolerated treatment well    Behavior During Therapy  [facility] for tasks assessed/performed       Past Medical History:  Diagnosis Date  . Anxiety   . Arthritis   . Asthma    childhood asthma  . Cancer ([place]) 7 years ago   lymphoma, [place] (recent)  . Chronic kidney disease   . Colon polyps   . GERD (gastroesophageal reflux disease)   . History of kidney stones   . Parkinson disease [facility])     Past Surgical History:  Procedure Laterality Date  . COLON SURGERY    . CYSTOSCOPY WITH LITHOLAPAXY N/A 11/14/2016   Procedure: CYSTOSCOPY WITH LITHOLAPAXY;  Surgeon: Hollice Espy, MD;  Location: [facility] ORS;  Service: Urology;  Laterality: N/A;  . EYE SURGERY Bilateral    Cataract Extraction with IOL  . HERNIA REPAIR  20 years  . MOHS SURGERY    . SMALL INTESTINE SURGERY     Per patient 7 years  . TRANSURETHRAL RESECTION OF PROSTATE N/A 11/14/2016   Procedure: TRANSURETHRAL RESECTION OF THE PROSTATE (TURP);  Surgeon: Hollice Espy, MD;  Location: [facility] ORS;  Service: Urology;  Laterality: N/A;    There were no vitals filed for this visit.  Subjective Assessment - 01/20/18 1911    Subjective   Patient states his wife is doing well after carpal tunnel surgery on her hand and she will also be getting therapy at the hand clinic.    Pertinent History  Patient reports he was diagnosed with Parkinson's disease about 10 plus years ago.  He reports a more  recent decline in function in his daily activities in the last 6-12 months.  He has been seeing PT for the last couple months.    Patient Stated Goals  Patient reports he wants to be able to do more for himself and around the house.     Currently in Pain?  No/denies    Pain Score  0-No pain                   OT Treatments/Exercises (OP) - 01/20/18 1913      Neurological Re-education Exercises   Other Exercises 1  Patient seen for instruction of LSVT BIG exercises: LSVT Daily Session Maximal Daily Exercises: Sustained movements are designed to rescale the amplitude of movement output for generalization to daily functional activities. Performed as follows for 1 set of 10 repetitions each: Multi directional sustained movements- 1) Floor to ceiling, 2) Side to side. Multi directional Repetitive movements performed in standing and are designed to provide retraining effort needed for sustained muscle activation in tasks Performed as follows: 3) Step and reach forward, 4) Step and Reach Backwards, 5) Step and reach sideways, 6) Rock and reach forward/backward, 7) Rock and reach sideways. CGA in standing for exercises for balance, patient occasionally reaching out to  touch chair as needed.      Other Exercises 2  Patient seen for focus on balance and weight shifting in standing from right to left sides, moving in and around objects with use of rollator and cues and posture exercises on the wall with weighted ball used for bilateral UE shoulder overhead raises, cues for scapular retraction and head position.             OT Education - 01/20/18 1912    Education provided  Yes    Education Details  balance, posture with wall stretches    Person(s) Educated  Patient    Methods  Explanation;Demonstration;Verbal cues    Comprehension  Verbal cues required;Returned demonstration;Verbalized understanding          OT Long Term Goals - 01/20/18 1913      OT LONG TERM GOAL #1   Title   Patient will demonstrate increase right hand grip to be able to cut food with modified independence.     Baseline  occasional assist required.    Time  12    Period  Weeks    Status  Partially Met      OT LONG TERM GOAL #2   Title  Patient will complete donning long sleeve shirt with modified independence.    Baseline  requires assist with long sleeves, can perform short sleeves.    Time  12    Period  Weeks    Status  Achieved      OT LONG TERM GOAL #3   Title  Patient will improve coordination to manage buttons on clothing with occasional assistance only.    Baseline  requires assist with buttons each time    Time  12    Period  Weeks    Status  Partially Met      OT LONG TERM GOAL #4   Title  Patient will complete managing pants after toileting with modified independence.     Baseline  assist most days     Time  12    Period  Weeks    Status  Achieved      OT LONG TERM GOAL #5   Title  Patient will demonstrate turning behaviors with rollator using large steps and keeping feet behind the rollator with all turns with occasional minimal cues.     Time  12    Period  Weeks    Status  On-going      OT LONG TERM GOAL #6   Title  Patient will improve gait speed and endurance and be able to walk 950 feet in 6 minutes to negotiate around the home and community safely in 4 weeks    Baseline  860 at reassessment    Time  4    Period  Weeks    Status  Revised      OT LONG TERM GOAL #7   Title  Patient will complete HEP for maximal daily exercises with modified independence in 4 weeks    Time  12    Period  Weeks    Status  Partially Met      OT LONG TERM GOAL #8   Title  Patient will transfer from sit to stand without the use of arms safely and independently from a variety of chairs/surfaces in 4 weeks.     Baseline  difficulty from lower surfaces    Time  4    Period  Weeks    Status  Achieved  OT LONG TERM GOAL  #9   Baseline  Patient will demonstrate decreased  episodes of freezing of behaviors from score of 16 to score of less than 12.    Time  4    Period  Weeks    Status  Achieved            Plan - 01/20/18 1912    Clinical Impression Statement  Patient demonstrates improvements in posture over the last couple of months, he still requires cues at times however, he tends to keep eye gaze up during exercises and is showing improvement in ROM of thoracic and cervical areas.  Patient continues to benefit from skilled OT to maximize safety and independence in daily tasks. He continues to benefit from repetition of tasks and modifications as needed.     Occupational Profile and client history currently impacting functional performance  progressive disease process, freezing of gait behaviors, fall risk    Occupational performance deficits (Please refer to evaluation for details):  ADL's;IADL's;Leisure    Rehab Potential  Good    Current Impairments/barriers affecting progress:  positive: family support, negative: level of motivation, progressive disease    OT Frequency  2x / week    OT Duration  12 weeks    OT Treatment/Interventions  Self-care/ADL training;Moist Heat;DME and/or AE instruction;Balance training;Therapeutic activities;Therapeutic exercise;Neuromuscular education;Functional Mobility Training;Patient/family education    Consulted and Agree with Plan of Care  Patient       Patient will benefit from skilled therapeutic intervention in order to improve the following deficits and impairments:  Abnormal gait, Decreased coordination, Decreased range of motion, Difficulty walking, Decreased endurance, Decreased safety awareness, Decreased activity tolerance, Decreased balance, Impaired UE functional use, Pain, Decreased mobility, Decreased strength  Visit Diagnosis: Difficulty in walking, not elsewhere classified  Unsteadiness on feet  Abnormal posture  Other lack of coordination  Muscle weakness (generalized)    Problem  List Patient Active Problem List   Diagnosis Date Noted  . BPH with urinary obstruction 11/14/2016  . Urinary frequency 10/10/2016  . Microscopic hematuria 10/10/2016  . Parkinson's syndrome ([place]) 06/14/2016  . Arthritis 06/14/2016  . Depression 06/14/2016  . Heartburn 06/14/2016  . Urinary incontinence 06/14/2016  . Skin lesion 06/14/2016  . Dementia 06/14/2016  . Bilateral edema of lower extremity 06/14/2016   Achilles Dunk, OTR/L, CLT  Lovett,Amy 01/20/2018, 7:17 PM  [place] MAIN [facility] SERVICES [address] [place], [place], [address] Phone: [phone]   Fax:  [phone]  Name: Luke Durham MRN: [id number] Date of Birth: 20-May-1933

## 2018-01-20 NOTE — Therapy (Signed)
[place] MAIN [facility] SERVICES [address] [place], [place], [address] Phone: [phone]   Fax:  [phone]  Occupational Therapy Treatment  Patient Details  Name: Toribio Seiber MRN: [id number] Date of Birth: 1932-11-29 No data recorded  Encounter Date: 01/12/2018  OT End of Session - 01/20/18 1836    Visit Number  99    Number of Visits  108    Date for OT Re-Evaluation  02/02/18    OT Start Time  1100    OT Stop Time  1200    OT Time Calculation (min)  60 min    Activity Tolerance  Patient tolerated treatment well    Behavior During Therapy  [facility] for tasks assessed/performed       Past Medical History:  Diagnosis Date  . Anxiety   . Arthritis   . Asthma    childhood asthma  . Cancer ([place]) 7 years ago   lymphoma, [place] (recent)  . Chronic kidney disease   . Colon polyps   . GERD (gastroesophageal reflux disease)   . History of kidney stones   . Parkinson disease [facility])     Past Surgical History:  Procedure Laterality Date  . COLON SURGERY    . CYSTOSCOPY WITH LITHOLAPAXY N/A 11/14/2016   Procedure: CYSTOSCOPY WITH LITHOLAPAXY;  Surgeon: Hollice Espy, MD;  Location: [facility] ORS;  Service: Urology;  Laterality: N/A;  . EYE SURGERY Bilateral    Cataract Extraction with IOL  . HERNIA REPAIR  20 years  . MOHS SURGERY    . SMALL INTESTINE SURGERY     Per patient 7 years  . TRANSURETHRAL RESECTION OF PROSTATE N/A 11/14/2016   Procedure: TRANSURETHRAL RESECTION OF THE PROSTATE (TURP);  Surgeon: Hollice Espy, MD;  Location: [facility] ORS;  Service: Urology;  Laterality: N/A;    There were no vitals filed for this visit.  Subjective Assessment - 01/20/18 1832    Subjective   Patient reports his son will be coming to visit next weekend and plans to help his wife and daughter to organize stuff in the garage.     Pertinent History  Patient reports he was diagnosed with Parkinson's disease about 10 plus years ago.  He reports a more  recent decline in function in his daily activities in the last 6-12 months.  He has been seeing PT for the last couple months.    Patient Stated Goals  Patient reports he wants to be able to do more for himself and around the house.     Currently in Pain?  No/denies    Pain Score  0-No pain                   OT Treatments/Exercises (OP) - 01/20/18 0001      ADLs   ADL Comments  Patient seen for donning and doffing socks and shoes including tying with crossed leg method.  Patient able to complete with increased time and occasional cues.       Neurological Re-education Exercises   Other Exercises 1  Patient seen for instruction of LSVT BIG exercises: LSVT Daily Session Maximal Daily Exercises: Sustained movements are designed to rescale the amplitude of movement output for generalization to daily functional activities. Performed as follows for 1 set of 10 repetitions each: Multi directional sustained movements- 1) Floor to ceiling, 2) Side to side. Multi directional Repetitive movements performed in standing and are designed to provide retraining effort needed for sustained muscle activation in tasks Performed  as follows: 3) Step and reach forward, 4) Step and Reach Backwards, 5) Step and reach sideways, 6) Rock and reach forward/backward, 7) Rock and reach sideways. CGA in standing for exercises for balance, patient occasionally reaching out to touch chair as needed.      Other Exercises 2  Functional mobility in hallways with SBA and cues for amplitude of steps and facilitation of turns. Patient demonstrating improvements with turns.              OT Education - 01/20/18 1835    Education provided  Yes    Education Details  HEP    Person(s) Educated  Patient    Methods  Explanation;Demonstration;Verbal cues    Comprehension  Verbal cues required;Returned demonstration;Verbalized understanding          OT Long Term Goals - 11/23/17 1831      OT LONG TERM GOAL #1    Title  Patient will demonstrate increase right hand grip to be able to cut food with modified independence.     Baseline  occasional assist required.    Time  12    Period  Weeks    Status  Partially Met      OT LONG TERM GOAL #2   Title  Patient will complete donning long sleeve shirt with modified independence.    Baseline  requires assist with long sleeves, can perform short sleeves.    Time  12    Period  Weeks    Status  Achieved      OT LONG TERM GOAL #3   Title  Patient will improve coordination to manage buttons on clothing with occasional assistance only.    Baseline  requires assist with buttons each time    Time  12    Period  Weeks    Status  Partially Met      OT LONG TERM GOAL #4   Title  Patient will complete managing pants after toileting with modified independence.     Baseline  assist most days     Time  12    Period  Weeks    Status  Achieved      OT LONG TERM GOAL #5   Title  Patient will demonstrate turning behaviors with rollator using large steps and keeping feet behind the rollator with all turns with occasional minimal cues.     Time  12    Period  Weeks    Status  On-going      OT LONG TERM GOAL #6   Title  Patient will improve gait speed and endurance and be able to walk 950 feet in 6 minutes to negotiate around the home and community safely in 4 weeks    Baseline  860 at reassessment    Time  4    Period  Weeks    Status  Revised      OT LONG TERM GOAL #7   Title  Patient will complete HEP for maximal daily exercises with modified independence in 4 weeks    Time  12    Period  Weeks    Status  Partially Met      OT LONG TERM GOAL #8   Title  Patient will transfer from sit to stand without the use of arms safely and independently from a variety of chairs/surfaces in 4 weeks.     Baseline  difficulty from lower surfaces    Time  4    Period  Weeks  Status  Achieved      OT LONG TERM GOAL  #9   Baseline  Patient will demonstrate  decreased episodes of freezing of behaviors from score of 16 to score of less than 12.    Time  4    Period  Weeks    Status  Achieved            Plan - 01/20/18 1836    Clinical Impression Statement  Patient demonstrating progress with his turns, especially when he has a larger space to move in with rollator.  He is planning to follow up with MD regarding his low BP next week. Continue to work towards goals.     Occupational Profile and client history currently impacting functional performance  progressive disease process, freezing of gait behaviors, fall risk    Occupational performance deficits (Please refer to evaluation for details):  ADL's;IADL's;Leisure    Rehab Potential  Good    Current Impairments/barriers affecting progress:  positive: family support, negative: level of motivation, progressive disease    OT Frequency  2x / week    OT Duration  12 weeks    OT Treatment/Interventions  Self-care/ADL training;Moist Heat;DME and/or AE instruction;Balance training;Therapeutic activities;Therapeutic exercise;Neuromuscular education;Functional Mobility Training;Patient/family education    Consulted and Agree with Plan of Care  Patient       Patient will benefit from skilled therapeutic intervention in order to improve the following deficits and impairments:  Abnormal gait, Decreased coordination, Decreased range of motion, Difficulty walking, Decreased endurance, Decreased safety awareness, Decreased activity tolerance, Decreased balance, Impaired UE functional use, Pain, Decreased mobility, Decreased strength  Visit Diagnosis: Difficulty in walking, not elsewhere classified  Unsteadiness on feet  Abnormal posture  Other lack of coordination  Muscle weakness (generalized)    Problem List Patient Active Problem List   Diagnosis Date Noted  . BPH with urinary obstruction 11/14/2016  . Urinary frequency 10/10/2016  . Microscopic hematuria 10/10/2016  . Parkinson's  syndrome ([place]) 06/14/2016  . Arthritis 06/14/2016  . Depression 06/14/2016  . Heartburn 06/14/2016  . Urinary incontinence 06/14/2016  . Skin lesion 06/14/2016  . Dementia 06/14/2016  . Bilateral edema of lower extremity 06/14/2016   Achilles Dunk, OTR/L, CLT  Lovett,Amy 01/20/2018, 6:38 PM  [place] MAIN [facility] SERVICES [address] [place], [place], [address] Phone: [phone]   Fax:  [phone]  Name: Valerio Pinard MRN: [id number] Date of Birth: 1933/05/01

## 2018-01-23 ENCOUNTER — Ambulatory Visit: Payer: Medicare Other | Attending: Neurology | Admitting: Occupational Therapy

## 2018-01-23 DIAGNOSIS — R2681 Unsteadiness on feet: Secondary | ICD-10-CM | POA: Insufficient documentation

## 2018-01-23 DIAGNOSIS — R278 Other lack of coordination: Secondary | ICD-10-CM

## 2018-01-23 DIAGNOSIS — R293 Abnormal posture: Secondary | ICD-10-CM | POA: Insufficient documentation

## 2018-01-23 DIAGNOSIS — R262 Difficulty in walking, not elsewhere classified: Secondary | ICD-10-CM

## 2018-01-23 DIAGNOSIS — M6281 Muscle weakness (generalized): Secondary | ICD-10-CM | POA: Diagnosis present

## 2018-01-26 ENCOUNTER — Ambulatory Visit: Payer: Medicare Other | Admitting: Occupational Therapy

## 2018-01-26 DIAGNOSIS — R278 Other lack of coordination: Secondary | ICD-10-CM

## 2018-01-26 DIAGNOSIS — R2681 Unsteadiness on feet: Secondary | ICD-10-CM | POA: Diagnosis not present

## 2018-01-26 DIAGNOSIS — M6281 Muscle weakness (generalized): Secondary | ICD-10-CM

## 2018-01-26 DIAGNOSIS — R262 Difficulty in walking, not elsewhere classified: Secondary | ICD-10-CM

## 2018-01-26 DIAGNOSIS — R293 Abnormal posture: Secondary | ICD-10-CM

## 2018-01-28 ENCOUNTER — Encounter: Payer: Self-pay | Admitting: Occupational Therapy

## 2018-01-28 NOTE — Therapy (Signed)
[place] MAIN [facility] SERVICES [address] [place], [place], [address] Phone: [phone]   Fax:  [phone]  Occupational Therapy Treatment  Patient Details  Name: Luke Durham MRN: [id number] Date of Birth: [birth year] No data recorded  Encounter Date: 01/23/2018  OT End of Session - 01/28/18 1030    Visit Number  102    Number of Visits  108    Date for OT Re-Evaluation  02/02/18    OT Start Time  1100    OT Stop Time  1200    OT Time Calculation (min)  60 min    Activity Tolerance  Patient tolerated treatment well    Behavior During Therapy  [facility] for tasks assessed/performed       Past Medical History:  Diagnosis Date  . Anxiety   . Arthritis   . Asthma    childhood asthma  . Cancer ([place]) 7 years ago   lymphoma, [place] (recent)  . Chronic kidney disease   . Colon polyps   . GERD (gastroesophageal reflux disease)   . History of kidney stones   . Parkinson disease [facility])     Past Surgical History:  Procedure Laterality Date  . COLON SURGERY    . CYSTOSCOPY WITH LITHOLAPAXY N/A 11/14/2016   Procedure: CYSTOSCOPY WITH LITHOLAPAXY;  Surgeon: Hollice Espy, MD;  Location: [facility] ORS;  Service: Urology;  Laterality: N/A;  . EYE SURGERY Bilateral    Cataract Extraction with IOL  . HERNIA REPAIR  20 years  . MOHS SURGERY    . SMALL INTESTINE SURGERY     Per patient 7 years  . TRANSURETHRAL RESECTION OF PROSTATE N/A 11/14/2016   Procedure: TRANSURETHRAL RESECTION OF THE PROSTATE (TURP);  Surgeon: Hollice Espy, MD;  Location: [facility] ORS;  Service: Urology;  Laterality: N/A;    There were no vitals filed for this visit.  Subjective Assessment - 01/28/18 1029    Subjective   Patient denies any falls, reports he had a good weekend spending time with his son who was in town for the weekend.     Pertinent History  Patient reports he was diagnosed with Parkinson's disease about 10 plus years ago.  He reports a more recent decline in  function in his daily activities in the last 6-12 months.  He has been seeing PT for the last couple months.    Patient Stated Goals  Patient reports he wants to be able to do more for himself and around the house.     Currently in Pain?  No/denies    Pain Score  0-No pain    Multiple Pain Sites  No                   OT Treatments/Exercises (OP) - 01/28/18 1032      ADLs   ADL Comments  Patient seen for sit to stand from a variety of surfaces, mat, chair, bench and toilet height with cues for weight shift forwards for multiple repetitions each surface.  Crossed leg method for donning socks, shoes and tying.      Neurological Re-education Exercises   Other Exercises 1  Patient seen for instruction of LSVT BIG exercises: LSVT Daily Session Maximal Daily Exercises: Sustained movements are designed to rescale the amplitude of movement output for generalization to daily functional activities. Performed as follows for 1 set of 10 repetitions each: Multi directional sustained movements- 1) Floor to ceiling, 2) Side to side. Multi directional Repetitive movements performed in  standing and are designed to provide retraining effort needed for sustained muscle activation in tasks Performed as follows: 3) Step and reach forward, 4) Step and Reach Backwards, 5) Step and reach sideways, 6) Rock and reach forward/backward, 7) Rock and reach sideways. CGA for exercises in standing, verbal cues and occasional tactile cues for technique.    Other Exercises 2  Functional mobility indoors with rollator and supervision with cues for turning in small spaces.               OT Education - 01/28/18 1030    Education provided  Yes    Education Details  posture exercises with ball    Person(s) Educated  Patient    Methods  Explanation;Demonstration;Verbal cues    Comprehension  Verbal cues required;Returned demonstration;Verbalized understanding          OT Long Term Goals - 01/20/18 1913       OT LONG TERM GOAL #1   Title  Patient will demonstrate increase right hand grip to be able to cut food with modified independence.     Baseline  occasional assist required.    Time  12    Period  Weeks    Status  Partially Met      OT LONG TERM GOAL #2   Title  Patient will complete donning long sleeve shirt with modified independence.    Baseline  requires assist with long sleeves, can perform short sleeves.    Time  12    Period  Weeks    Status  Achieved      OT LONG TERM GOAL #3   Title  Patient will improve coordination to manage buttons on clothing with occasional assistance only.    Baseline  requires assist with buttons each time    Time  12    Period  Weeks    Status  Partially Met      OT LONG TERM GOAL #4   Title  Patient will complete managing pants after toileting with modified independence.     Baseline  assist most days     Time  12    Period  Weeks    Status  Achieved      OT LONG TERM GOAL #5   Title  Patient will demonstrate turning behaviors with rollator using large steps and keeping feet behind the rollator with all turns with occasional minimal cues.     Time  12    Period  Weeks    Status  On-going      OT LONG TERM GOAL #6   Title  Patient will improve gait speed and endurance and be able to walk 950 feet in 6 minutes to negotiate around the home and community safely in 4 weeks    Baseline  860 at reassessment    Time  4    Period  Weeks    Status  Revised      OT LONG TERM GOAL #7   Title  Patient will complete HEP for maximal daily exercises with modified independence in 4 weeks    Time  12    Period  Weeks    Status  Partially Met      OT LONG TERM GOAL #8   Title  Patient will transfer from sit to stand without the use of arms safely and independently from a variety of chairs/surfaces in 4 weeks.     Baseline  difficulty from lower surfaces    Time  4    Period  Weeks    Status  Achieved      OT LONG TERM GOAL  #9   Baseline   Patient will demonstrate decreased episodes of freezing of behaviors from score of 16 to score of less than 12.    Time  4    Period  Weeks    Status  Achieved            Plan - 01/28/18 1031    Clinical Impression Statement  Patient continues to progress with daily exercises and functional mobility skills.  Patient requires cues for turns in small spaces, cue for BIG step to the right to keep positioned behind the rollator.  Patient continues to benefit from skilled OT to maximize safety and independence in daily tasks.     Occupational Profile and client history currently impacting functional performance  progressive disease process, freezing of gait behaviors, fall risk    Occupational performance deficits (Please refer to evaluation for details):  ADL's;IADL's;Leisure    Rehab Potential  Good    Current Impairments/barriers affecting progress:  positive: family support, negative: level of motivation, progressive disease    OT Frequency  2x / week    OT Duration  12 weeks    OT Treatment/Interventions  Self-care/ADL training;Moist Heat;DME and/or AE instruction;Balance training;Therapeutic activities;Therapeutic exercise;Neuromuscular education;Functional Mobility Training;Patient/family education    Consulted and Agree with Plan of Care  Patient       Patient will benefit from skilled therapeutic intervention in order to improve the following deficits and impairments:  Abnormal gait, Decreased coordination, Decreased range of motion, Difficulty walking, Decreased endurance, Decreased safety awareness, Decreased activity tolerance, Decreased balance, Impaired UE functional use, Pain, Decreased mobility, Decreased strength  Visit Diagnosis: Unsteadiness on feet  Abnormal posture  Difficulty in walking, not elsewhere classified  Other lack of coordination  Muscle weakness (generalized)    Problem List Patient Active Problem List   Diagnosis Date Noted  . BPH with urinary  obstruction 11/14/2016  . Urinary frequency 10/10/2016  . Microscopic hematuria 10/10/2016  . Parkinson's syndrome ([place]) 06/14/2016  . Arthritis 06/14/2016  . Depression 06/14/2016  . Heartburn 06/14/2016  . Urinary incontinence 06/14/2016  . Skin lesion 06/14/2016  . Dementia 06/14/2016  . Bilateral edema of lower extremity 06/14/2016   Achilles Dunk, OTR/L, CLT  Linetta Regner 01/28/2018, 10:38 AM  [place] MAIN [facility] SERVICES [address] [place], [place], [address] Phone: [phone]   Fax:  [phone]  Name: Luke Durham MRN: [id number] Date of Birth: December 29, 1932

## 2018-01-30 ENCOUNTER — Encounter: Payer: Self-pay | Admitting: Occupational Therapy

## 2018-01-30 ENCOUNTER — Encounter: Payer: Medicare Other | Admitting: Occupational Therapy

## 2018-01-30 NOTE — Therapy (Signed)
[place] MAIN [facility] SERVICES [address] [place], [place], [address] Phone: [phone]   Fax:  [phone]  Occupational Therapy Treatment  Patient Details  Name: Luke Durham MRN: [id number] Date of Birth: 1933/03/28 No data recorded  Encounter Date: 01/26/2018  OT End of Session - 01/30/18 1639    Visit Number  103    Number of Visits  108    Date for OT Re-Evaluation  02/02/18    OT Start Time  1101    OT Stop Time  1200    OT Time Calculation (min)  59 min    Activity Tolerance  Patient tolerated treatment well    Behavior During Therapy  [facility] for tasks assessed/performed       Past Medical History:  Diagnosis Date  . Anxiety   . Arthritis   . Asthma    childhood asthma  . Cancer ([place]) 7 years ago   lymphoma, [place] (recent)  . Chronic kidney disease   . Colon polyps   . GERD (gastroesophageal reflux disease)   . History of kidney stones   . Parkinson disease [facility])     Past Surgical History:  Procedure Laterality Date  . COLON SURGERY    . CYSTOSCOPY WITH LITHOLAPAXY N/A 11/14/2016   Procedure: CYSTOSCOPY WITH LITHOLAPAXY;  Surgeon: Hollice Espy, MD;  Location: [facility] ORS;  Service: Urology;  Laterality: N/A;  . EYE SURGERY Bilateral    Cataract Extraction with IOL  . HERNIA REPAIR  20 years  . MOHS SURGERY    . SMALL INTESTINE SURGERY     Per patient 7 years  . TRANSURETHRAL RESECTION OF PROSTATE N/A 11/14/2016   Procedure: TRANSURETHRAL RESECTION OF THE PROSTATE (TURP);  Surgeon: Hollice Espy, MD;  Location: [facility] ORS;  Service: Urology;  Laterality: N/A;    There were no vitals filed for this visit.  Subjective Assessment - 01/30/18 1633    Subjective   Patient reports he is doing well, his daughter is planning to work at [employer] this weekend in [place] so they will not be going out on Saturday for their usual lunch.     Pertinent History  Patient reports he was diagnosed with Parkinson's disease  about 10 plus years ago.  He reports a more recent decline in function in his daily activities in the last 6-12 months.  He has been seeing PT for the last couple months.    Patient Stated Goals  Patient reports he wants to be able to do more for himself and around the house.     Currently in Pain?  No/denies    Pain Score  0-No pain                   OT Treatments/Exercises (OP) - 01/30/18 1634      ADLs   ADL Comments  Patient seen for focus on lower body dressing with socks and shoes and including tying with crossed leg method and emphasis on speed of task. Verbal cues required for technique. Patient seen for handwriting skills with right dominant hand with use of larger lined paper and use of hand flicks prior to writing to increase performance and legiliblity.       Neurological Re-education Exercises   Other Exercises 1  Patient seen for instruction of LSVT BIG exercises: LSVT Daily Session Maximal Daily Exercises: Sustained movements are designed to rescale the amplitude of movement output for generalization to daily functional activities. Performed as follows  for 1 set of 10 repetitions each: Multi directional sustained movements- 1) Floor to ceiling, 2) Side to side. Multi directional Repetitive movements performed in standing and are designed to provide retraining effort needed for sustained muscle activation in tasks Performed as follows: 3) Step and reach forward, 4) Step and Reach Backwards, 5) Step and reach sideways, 6) Rock and reach forward/backward, 7) Rock and reach sideways.  SBA for most exercises in standing except CGA for stepping backwards exercise, along with verbal cues for proper form and technique.     Other Exercises 2  Patient seen for 1000g weighted ball exercise for bilateral UE at the wall in combination with posture exercises. Patient performing functional mobility with rollator in hallway for 2 trials of 600 feet, cues for turns and occasional cues for  amplitude of steps.               OT Education - 01/30/18 1639    Education provided  Yes    Education Details  lower body dressing techniques    Person(s) Educated  Patient    Methods  Explanation;Demonstration;Verbal cues    Comprehension  Verbal cues required;Returned demonstration;Verbalized understanding          OT Long Term Goals - 01/20/18 1913      OT LONG TERM GOAL #1   Title  Patient will demonstrate increase right hand grip to be able to cut food with modified independence.     Baseline  occasional assist required.    Time  12    Period  Weeks    Status  Partially Met      OT LONG TERM GOAL #2   Title  Patient will complete donning long sleeve shirt with modified independence.    Baseline  requires assist with long sleeves, can perform short sleeves.    Time  12    Period  Weeks    Status  Achieved      OT LONG TERM GOAL #3   Title  Patient will improve coordination to manage buttons on clothing with occasional assistance only.    Baseline  requires assist with buttons each time    Time  12    Period  Weeks    Status  Partially Met      OT LONG TERM GOAL #4   Title  Patient will complete managing pants after toileting with modified independence.     Baseline  assist most days     Time  12    Period  Weeks    Status  Achieved      OT LONG TERM GOAL #5   Title  Patient will demonstrate turning behaviors with rollator using large steps and keeping feet behind the rollator with all turns with occasional minimal cues.     Time  12    Period  Weeks    Status  On-going      OT LONG TERM GOAL #6   Title  Patient will improve gait speed and endurance and be able to walk 950 feet in 6 minutes to negotiate around the home and community safely in 4 weeks    Baseline  860 at reassessment    Time  4    Period  Weeks    Status  Revised      OT LONG TERM GOAL #7   Title  Patient will complete HEP for maximal daily exercises with modified independence in 4  weeks    Time  12  Period  Weeks    Status  Partially Met      OT LONG TERM GOAL #8   Title  Patient will transfer from sit to stand without the use of arms safely and independently from a variety of chairs/surfaces in 4 weeks.     Baseline  difficulty from lower surfaces    Time  4    Period  Weeks    Status  Achieved      OT LONG TERM GOAL  #9   Baseline  Patient will demonstrate decreased episodes of freezing of behaviors from score of 16 to score of less than 12.    Time  4    Period  Weeks    Status  Achieved            Plan - 01/30/18 1640    Clinical Impression Statement  Patient becoming more consistent with turns especially in larger spaces, still has some evidence of freezing at times with smaller areas and with distractions present.  He continues to work towards improving amplitude of movements, calibration of movements with exercises and balance with activities in standing.  Posture has slowly improved with exercises and cues provided during session.  Continue to work towards goals to increase independence and safety in daily tasks.     Occupational Profile and client history currently impacting functional performance  progressive disease process, freezing of gait behaviors, fall risk    Occupational performance deficits (Please refer to evaluation for details):  ADL's;IADL's;Leisure    Rehab Potential  Good    Current Impairments/barriers affecting progress:  positive: family support, negative: level of motivation, progressive disease    OT Frequency  2x / week    OT Duration  12 weeks    OT Treatment/Interventions  Self-care/ADL training;Moist Heat;DME and/or AE instruction;Balance training;Therapeutic activities;Therapeutic exercise;Neuromuscular education;Functional Mobility Training;Patient/family education    Consulted and Agree with Plan of Care  Patient       Patient will benefit from skilled therapeutic intervention in order to improve the following  deficits and impairments:  Abnormal gait, Decreased coordination, Decreased range of motion, Difficulty walking, Decreased endurance, Decreased safety awareness, Decreased activity tolerance, Decreased balance, Impaired UE functional use, Pain, Decreased mobility, Decreased strength  Visit Diagnosis: Unsteadiness on feet  Abnormal posture  Difficulty in walking, not elsewhere classified  Other lack of coordination  Muscle weakness (generalized)    Problem List Patient Active Problem List   Diagnosis Date Noted  . BPH with urinary obstruction 11/14/2016  . Urinary frequency 10/10/2016  . Microscopic hematuria 10/10/2016  . Parkinson's syndrome ([place]) 06/14/2016  . Arthritis 06/14/2016  . Depression 06/14/2016  . Heartburn 06/14/2016  . Urinary incontinence 06/14/2016  . Skin lesion 06/14/2016  . Dementia 06/14/2016  . Bilateral edema of lower extremity 06/14/2016   Achilles Dunk, OTR/L, CLT  Lovett,Amy 01/30/2018, 4:43 PM  [place] MAIN [facility] SERVICES [address] [place], [place], [address] Phone: [phone]   Fax:  [phone]  Name: Luke Durham MRN: [id number] Date of Birth: 12/10/1932

## 2018-02-02 ENCOUNTER — Ambulatory Visit: Payer: Medicare Other | Admitting: Occupational Therapy

## 2018-02-02 DIAGNOSIS — R2681 Unsteadiness on feet: Secondary | ICD-10-CM

## 2018-02-02 DIAGNOSIS — R278 Other lack of coordination: Secondary | ICD-10-CM

## 2018-02-02 DIAGNOSIS — M6281 Muscle weakness (generalized): Secondary | ICD-10-CM

## 2018-02-02 DIAGNOSIS — R262 Difficulty in walking, not elsewhere classified: Secondary | ICD-10-CM

## 2018-02-02 DIAGNOSIS — R293 Abnormal posture: Secondary | ICD-10-CM

## 2018-02-03 ENCOUNTER — Encounter: Payer: Self-pay | Admitting: Occupational Therapy

## 2018-02-03 NOTE — Therapy (Signed)
[place] MAIN [facility] SERVICES [address] [place], [place], [address] Phone: [phone]   Fax:  [phone]  Occupational Therapy Treatment  Patient Details  Name: Luke Durham MRN: [id number] Date of Birth: 1933/03/07 No data recorded  Encounter Date: 02/02/2018  OT End of Session - 02/03/18 1830    Visit Number  104    Number of Visits  108    Date for OT Re-Evaluation  02/02/18    OT Start Time  1100    OT Stop Time  1200    OT Time Calculation (min)  60 min    Activity Tolerance  Patient tolerated treatment well    Behavior During Therapy  [facility] for tasks assessed/performed       Past Medical History:  Diagnosis Date  . Anxiety   . Arthritis   . Asthma    childhood asthma  . Cancer ([place]) 7 years ago   lymphoma, [place] (recent)  . Chronic kidney disease   . Colon polyps   . GERD (gastroesophageal reflux disease)   . History of kidney stones   . Parkinson disease [facility])     Past Surgical History:  Procedure Laterality Date  . COLON SURGERY    . CYSTOSCOPY WITH LITHOLAPAXY N/A 11/14/2016   Procedure: CYSTOSCOPY WITH LITHOLAPAXY;  Surgeon: Hollice Espy, MD;  Location: [facility] ORS;  Service: Urology;  Laterality: N/A;  . EYE SURGERY Bilateral    Cataract Extraction with IOL  . HERNIA REPAIR  20 years  . MOHS SURGERY    . SMALL INTESTINE SURGERY     Per patient 7 years  . TRANSURETHRAL RESECTION OF PROSTATE N/A 11/14/2016   Procedure: TRANSURETHRAL RESECTION OF THE PROSTATE (TURP);  Surgeon: Hollice Espy, MD;  Location: [facility] ORS;  Service: Urology;  Laterality: N/A;    There were no vitals filed for this visit.  Subjective Assessment - 02/03/18 1829    Subjective   Patient reports he had a surgical procedure earlier this week on his scalp. He has been limited this week to light activity and no bending forward.  Wearing a hat today    Pertinent History  Patient reports he was diagnosed with Parkinson's disease about 10 plus  years ago.  He reports a more recent decline in function in his daily activities in the last 6-12 months.  He has been seeing PT for the last couple months.    Patient Stated Goals  Patient reports he wants to be able to do more for himself and around the house.     Currently in Pain?  No/denies    Pain Score  0-No pain                   OT Treatments/Exercises (OP) - 02/03/18 1833      ADLs   ADL Comments  Patient seen for sit to stand transfers from a variety of surfaces, mat, bench and chair with occasional verbal cues for technique to perform without the use of hand, cues for weight shift forwards.  Focused this date on turns to approach surface, simulated toilet transfer.        Neurological Re-education Exercises   Other Exercises 1  Patient seen for instruction of LSVT BIG exercises: LSVT Daily Session Maximal Daily Exercises: Sustained movements are designed to rescale the amplitude of movement output for generalization to daily functional activities. Performed as follows for 1 set of 10 repetitions each: Multi directional sustained movements- 1) Floor to ceiling,  2) Side to side. Multi directional Repetitive movements performed in standing and are designed to provide retraining effort needed for sustained muscle activation in tasks Performed as follows: 3) Step and reach forward, 4) Step and Reach Backwards, 5) Step and reach sideways, 6) Rock and reach forward/backward, 7) Rock and reach sideways.  SBA for most exercises in standing except CGA for stepping backwards exercise, along with verbal cues for proper form and technique. Exercises were modified this date due to restrictions from outpatient surgical intervention this week, with no bending forward and light exercise.    Other Exercises 2  Functional mobility skills in crowded spaces with use of rollator, no freezing or stopping this date as people are approaching.             OT Education - 02/03/18 1830     Education provided  Yes    Education Details  amplitude of steps especially with turns    Person(s) Educated  Patient    Methods  Explanation;Demonstration;Verbal cues    Comprehension  Verbal cues required;Returned demonstration;Verbalized understanding          OT Long Term Goals - 01/20/18 1913      OT LONG TERM GOAL #1   Title  Patient will demonstrate increase right hand grip to be able to cut food with modified independence.     Baseline  occasional assist required.    Time  12    Period  Weeks    Status  Partially Met      OT LONG TERM GOAL #2   Title  Patient will complete donning long sleeve shirt with modified independence.    Baseline  requires assist with long sleeves, can perform short sleeves.    Time  12    Period  Weeks    Status  Achieved      OT LONG TERM GOAL #3   Title  Patient will improve coordination to manage buttons on clothing with occasional assistance only.    Baseline  requires assist with buttons each time    Time  12    Period  Weeks    Status  Partially Met      OT LONG TERM GOAL #4   Title  Patient will complete managing pants after toileting with modified independence.     Baseline  assist most days     Time  12    Period  Weeks    Status  Achieved      OT LONG TERM GOAL #5   Title  Patient will demonstrate turning behaviors with rollator using large steps and keeping feet behind the rollator with all turns with occasional minimal cues.     Time  12    Period  Weeks    Status  On-going      OT LONG TERM GOAL #6   Title  Patient will improve gait speed and endurance and be able to walk 950 feet in 6 minutes to negotiate around the home and community safely in 4 weeks    Baseline  860 at reassessment    Time  4    Period  Weeks    Status  Revised      OT LONG TERM GOAL #7   Title  Patient will complete HEP for maximal daily exercises with modified independence in 4 weeks    Time  12    Period  Weeks    Status  Partially Met  OT LONG TERM GOAL #8   Title  Patient will transfer from sit to stand without the use of arms safely and independently from a variety of chairs/surfaces in 4 weeks.     Baseline  difficulty from lower surfaces    Time  4    Period  Weeks    Status  Achieved      OT LONG TERM GOAL  #9   Baseline  Patient will demonstrate decreased episodes of freezing of behaviors from score of 16 to score of less than 12.    Time  4    Period  Weeks    Status  Achieved            Plan - 02/03/18 1831    Clinical Impression Statement  Modification of exercises this date to accommodate restrictions for this week after surgical procedure to scalp. Patient was able to perform exercises in a modified version with therapist demonstration and occasional assistance for balance in standing. Verbal cues provided for posture and upright body positioning for improved balance during exercises and functional mobility. Patient continues to work towards calibration of movement patterns with turning using a roll later and diminishing freezing of gait patterns.  Will plan for reassessment of goals and skills next session and update plan of care.     Occupational Profile and client history currently impacting functional performance  progressive disease process, freezing of gait behaviors, fall risk    Occupational performance deficits (Please refer to evaluation for details):  ADL's;IADL's;Leisure    Rehab Potential  Good    Current Impairments/barriers affecting progress:  positive: family support, negative: level of motivation, progressive disease    OT Frequency  2x / week    OT Duration  12 weeks    OT Treatment/Interventions  Self-care/ADL training;Moist Heat;DME and/or AE instruction;Balance training;Therapeutic activities;Therapeutic exercise;Neuromuscular education;Functional Mobility Training;Patient/family education    Consulted and Agree with Plan of Care  Patient       Patient will benefit from skilled  therapeutic intervention in order to improve the following deficits and impairments:  Abnormal gait, Decreased coordination, Decreased range of motion, Difficulty walking, Decreased endurance, Decreased safety awareness, Decreased activity tolerance, Decreased balance, Impaired UE functional use, Pain, Decreased mobility, Decreased strength  Visit Diagnosis: Unsteadiness on feet  Abnormal posture  Difficulty in walking, not elsewhere classified  Other lack of coordination  Muscle weakness (generalized)    Problem List Patient Active Problem List   Diagnosis Date Noted  . BPH with urinary obstruction 11/14/2016  . Urinary frequency 10/10/2016  . Microscopic hematuria 10/10/2016  . Parkinson's syndrome ([place]) 06/14/2016  . Arthritis 06/14/2016  . Depression 06/14/2016  . Heartburn 06/14/2016  . Urinary incontinence 06/14/2016  . Skin lesion 06/14/2016  . Dementia 06/14/2016  . Bilateral edema of lower extremity 06/14/2016   Achilles Dunk, OTR/L, CLT  Lovett,Amy 02/03/2018, 6:36 PM  [place] MAIN [facility] SERVICES [address] [place], [place], [address] Phone: [phone]   Fax:  [phone]  Name: Luke Durham MRN: [id number] Date of Birth: 1932/12/12

## 2018-02-06 ENCOUNTER — Ambulatory Visit: Payer: Medicare Other | Admitting: Occupational Therapy

## 2018-02-06 DIAGNOSIS — R2681 Unsteadiness on feet: Secondary | ICD-10-CM

## 2018-02-06 DIAGNOSIS — M6281 Muscle weakness (generalized): Secondary | ICD-10-CM

## 2018-02-06 DIAGNOSIS — R293 Abnormal posture: Secondary | ICD-10-CM

## 2018-02-06 DIAGNOSIS — R278 Other lack of coordination: Secondary | ICD-10-CM

## 2018-02-06 DIAGNOSIS — R262 Difficulty in walking, not elsewhere classified: Secondary | ICD-10-CM

## 2018-02-07 ENCOUNTER — Encounter: Payer: Self-pay | Admitting: Occupational Therapy

## 2018-02-09 ENCOUNTER — Ambulatory Visit: Payer: Medicare Other | Admitting: Occupational Therapy

## 2018-02-12 ENCOUNTER — Ambulatory Visit: Payer: Medicare Other | Admitting: Occupational Therapy

## 2018-02-12 DIAGNOSIS — R278 Other lack of coordination: Secondary | ICD-10-CM

## 2018-02-12 DIAGNOSIS — R293 Abnormal posture: Secondary | ICD-10-CM

## 2018-02-12 DIAGNOSIS — R262 Difficulty in walking, not elsewhere classified: Secondary | ICD-10-CM

## 2018-02-12 DIAGNOSIS — R2681 Unsteadiness on feet: Secondary | ICD-10-CM | POA: Diagnosis not present

## 2018-02-12 DIAGNOSIS — M6281 Muscle weakness (generalized): Secondary | ICD-10-CM

## 2018-02-13 ENCOUNTER — Encounter: Payer: Medicare Other | Admitting: Occupational Therapy

## 2018-02-16 ENCOUNTER — Ambulatory Visit: Payer: Medicare Other | Admitting: Occupational Therapy

## 2018-02-17 ENCOUNTER — Encounter: Payer: Self-pay | Admitting: Occupational Therapy

## 2018-02-17 NOTE — Therapy (Addendum)
[place] MAIN [facility] SERVICES [address] [place], [place], [address] Phone: [phone]   Fax:  [phone]  Occupational Therapy Treatment/Progress Update Recertification Reporting period from 11/23/2017 To 02/06/2018  Patient Details  Name: Luke Durham MRN: [id number] Date of Birth: 1933/06/06 No data recorded  Encounter Date: 02/06/2018  OT End of Session - 02/17/18 1849    Visit Number  105    Number of Visits  132    Date for OT Re-Evaluation  05/02/18    OT Start Time  1055    OT Stop Time  1200    OT Time Calculation (min)  65 min    Activity Tolerance  Patient tolerated treatment well    Behavior During Therapy  [facility] for tasks assessed/performed       Past Medical History:  Diagnosis Date  . Anxiety   . Arthritis   . Asthma    childhood asthma  . Cancer ([place]) 7 years ago   lymphoma, [place] (recent)  . Chronic kidney disease   . Colon polyps   . GERD (gastroesophageal reflux disease)   . History of kidney stones   . Parkinson disease [facility])     Past Surgical History:  Procedure Laterality Date  . COLON SURGERY    . CYSTOSCOPY WITH LITHOLAPAXY N/A 11/14/2016   Procedure: CYSTOSCOPY WITH LITHOLAPAXY;  Surgeon: Hollice Espy, MD;  Location: [facility] ORS;  Service: Urology;  Laterality: N/A;  . EYE SURGERY Bilateral    Cataract Extraction with IOL  . HERNIA REPAIR  20 years  . MOHS SURGERY    . SMALL INTESTINE SURGERY     Per patient 7 years  . TRANSURETHRAL RESECTION OF PROSTATE N/A 11/14/2016   Procedure: TRANSURETHRAL RESECTION OF THE PROSTATE (TURP);  Surgeon: Hollice Espy, MD;  Location: [facility] ORS;  Service: Urology;  Laterality: N/A;    There were no vitals filed for this visit.  Subjective Assessment - 02/17/18 1848    Subjective   Patient reports he is doing well after the [facility] procedure.  No pain reported.  Patient reports he has days where he has no energy.  He says that some days are better than others.   Reports he has had 2 friends to pass away this week and feels sad.    Pertinent History  Patient reports he was diagnosed with Parkinson's disease about 10 plus years ago.  He reports a more recent decline in function in his daily activities in the last 6-12 months.  He has been seeing PT for the last couple months.    Patient Stated Goals  Patient reports he wants to be able to do more for himself and around the house.     Currently in Pain?  No/denies    Pain Score  0-No pain                   OT Treatments/Exercises (OP) - 02/17/18 1848      ADLs   ADL Comments  Patient seen for sit to stand transfers from a variety of surfaces, mat, bench and chair with occasional verbal cues for technique to perform without the use of hand, cues for weight shift forwards.  Focused this date on turns to approach surface, simulated toilet transfer.        Neurological Re-education Exercises   Other Exercises 1  Patient seen for instruction of LSVT BIG exercises: LSVT Daily Session Maximal Daily Exercises: Sustained movements are designed to rescale the amplitude  of movement output for generalization to daily functional activities. Performed as follows for 1 set of 10 repetitions each: Multi directional sustained movements- 1) Floor to ceiling, 2) Side to side. Multi directional Repetitive movements performed in standing and are designed to provide retraining effort needed for sustained muscle activation in tasks Performed as follows: 3) Step and reach forward, 4) Step and Reach Backwards, 5) Step and reach sideways, 6) Rock and reach forward/backward, 7) Rock and reach sideways.  SBA for most exercises in standing except CGA for stepping backwards exercise, along with verbal cues for proper form and technique. Exercises were modified this date due to restrictions from outpatient surgical intervention this week, with no bending forward and light exercise.       Patient seen for reassessment of skills  as follows:  6 minute walk test 980 feet this date. Blood pressure was 112/64.  Grip strength on the right 57#, left 55#.  9 hole peg test, right 40 secs, left 49 secs ADL assessment:  Patient able to don and doff sock and shoes including tying.  He occasionally requires assist to tie laces if out in the community and they become untied.  He is now able to cut his food most of the time, occasionally needs help as the consistency of the food increases.  He does well with soup especially with cream based soups and demonstrates decreased spillage.  Handwriting continues to improve, printing is better, script is still difficult to read.  Functional mobility:  Patient continues to demo difficulty with balance, continues to have difficulty at times with crowds and especially when in unfamiliar spaces/places. He feels the freezing of gait feels worse overall at times.  He feels backing up to surfaces are still difficult and size of step remains smaller in size.  Turns have improved, execution is still difficult at times but responds well to cues and turns in larger spaces. He has had one fall in the last month.  He feels he needs to continue to work towards improved posture, pain had decreased over time as posture has improved.   5 times sit to stand 12 secs from mat table.        OT Education - 02/17/18 1849    Education provided  Yes    Education Details  goals, POC and daily exercises    Person(s) Educated  Patient    Methods  Explanation;Demonstration;Verbal cues    Comprehension  Verbal cues required;Returned demonstration;Verbalized understanding          OT Long Term Goals - 02/17/18 1849      OT LONG TERM GOAL #1   Title  Patient will demonstrate increase right hand grip to be able to cut food with modified independence.     Baseline  occasional assist required.    Time  12    Period  Weeks    Status  Partially Met      OT LONG TERM GOAL #2   Title  Patient will complete donning long  sleeve shirt with modified independence.    Baseline  requires assist with long sleeves, can perform short sleeves.    Time  12    Period  Weeks    Status  Achieved      OT LONG TERM GOAL #3   Title  Patient will improve coordination to manage buttons on clothing with occasional assistance only.    Baseline  requires assist with buttons each time    Time  12  Period  Weeks    Status  Partially Met      OT LONG TERM GOAL #4   Title  Patient will complete managing pants after toileting with modified independence.     Baseline  assist most days     Time  12    Period  Weeks    Status  Achieved      OT LONG TERM GOAL #5   Title  Patient will demonstrate turning behaviors with rollator using large steps and keeping feet behind the rollator with all turns with occasional minimal cues.     Time  12    Period  Weeks    Status  On-going      OT LONG TERM GOAL #6   Title  Patient will improve gait speed and endurance and be able to walk 950 feet in 6 minutes to negotiate around the home and community safely in 4 weeks    Baseline  860 at reassessment    Time  4    Period  Weeks    Status  Revised      OT LONG TERM GOAL #7   Title  Patient will complete HEP for maximal daily exercises with modified independence in 4 weeks    Time  12    Period  Weeks    Status  Partially Met      OT LONG TERM GOAL #8   Title  Patient will transfer from sit to stand without the use of arms safely and independently from a variety of chairs/surfaces in 4 weeks.     Baseline  difficulty from lower surfaces    Time  4    Period  Weeks    Status  Achieved      OT LONG TERM GOAL  #9   Baseline  Patient will demonstrate decreased episodes of freezing of behaviors from score of 16 to score of less than 12.    Time  4    Period  Weeks    Status  Achieved            Plan - 02/17/18 1849    Clinical Impression Statement Patient seen for reassessment of skills this date.  Patient seen for  reassessment of skills as follows:  6 minute walk test 980 feet this date. Blood pressure was 112/64.  Grip strength on the right 57#, left 55#.  9 hole peg test, right 40 secs, left 49 secs ADL assessment:  Patient able to don and doff sock and shoes including tying.  He occasionally requires assist to tie laces if out in the community and they become untied.  He is now able to cut his food most of the time, occasionally needs help as the consistency of the food increases.  He does well with soup especially with cream based soups and demonstrates decreased spillage.  Handwriting continues to improve, printing is better, script is still difficult to read.  Functional mobility:  Patient continues to demo difficulty with balance, continues to have difficulty at times with crowds and especially when in unfamiliar spaces/places. He feels the freezing of gait feels worse overall at times.  He feels backing up to surfaces are still difficult and size of step remains smaller in size.  Turns have improved, execution is still difficult at times but responds well to cues and turns in larger spaces. He has had one fall in the last month.  He feels he needs to continue to work towards improved posture,   pain had decreased over time as posture has improved.   5 times sit to stand 12 secs from mat table.   Patient continues to work towards calibration of movement patterns with turning using a roll later and diminishing freezing of gait patterns.  Patient continues to benefit from skilled OT to maximize safety and independence in daily tasks.     Occupational Profile and client history currently impacting functional performance  progressive disease process, freezing of gait behaviors, fall risk    Occupational performance deficits (Please refer to evaluation for details):  ADL's;IADL's;Leisure    Rehab Potential  Good    Current Impairments/barriers affecting progress:  positive: family support, negative: level of  motivation, progressive disease    OT Frequency  2x / week    OT Duration  12 weeks    OT Treatment/Interventions  Self-care/ADL training;Moist Heat;DME and/or AE instruction;Balance training;Therapeutic activities;Therapeutic exercise;Neuromuscular education;Functional Mobility Training;Patient/family education    Consulted and Agree with Plan of Care  Patient    Family Member Consulted  wife       Patient will benefit from skilled therapeutic intervention in order to improve the following deficits and impairments:  Abnormal gait, Decreased coordination, Decreased range of motion, Difficulty walking, Decreased endurance, Decreased safety awareness, Decreased activity tolerance, Decreased balance, Impaired UE functional use, Pain, Decreased mobility, Decreased strength  Visit Diagnosis: Unsteadiness on feet  Abnormal posture  Difficulty in walking, not elsewhere classified  Other lack of coordination  Muscle weakness (generalized)    Problem List Patient Active Problem List   Diagnosis Date Noted  . BPH with urinary obstruction 11/14/2016  . Urinary frequency 10/10/2016  . Microscopic hematuria 10/10/2016  . Parkinson's syndrome ([facility]) 06/14/2016  . Arthritis 06/14/2016  . Depression 06/14/2016  . Heartburn 06/14/2016  . Urinary incontinence 06/14/2016  . Skin lesion 06/14/2016  . Dementia 06/14/2016  . Bilateral edema of lower extremity 06/14/2016   Amy T Lovett, OTR/L, CLT  Lovett,Amy 02/17/2018, 6:50 PM  [place] [facility] MA[facility] REHAB SERVICES [address] [place], [place], [address] Phone: [phone]   Fax:  [phone]  Name: Brennon Westerhoff MRN: [id number] Date of Birth: 08/09/1933 

## 2018-02-17 NOTE — Therapy (Signed)
[place] MAIN [facility] SERVICES [address] [place], [place], [address] Phone: [phone]   Fax:  [phone]  Occupational Therapy Treatment  Patient Details  Name: Luke Durham MRN: [id number] Date of Birth: 12/18/1932 No data recorded  Encounter Date: 02/12/2018  OT End of Session - 02/17/18 1852    Visit Number  106    Number of Visits  132    Date for OT Re-Evaluation  05/02/18    OT Start Time  1400    OT Stop Time  1456    OT Time Calculation (min)  56 min    Activity Tolerance  Patient tolerated treatment well    Behavior During Therapy  [facility] for tasks assessed/performed       Past Medical History:  Diagnosis Date  . Anxiety   . Arthritis   . Asthma    childhood asthma  . Cancer ([place]) 7 years ago   lymphoma, [place] (recent)  . Chronic kidney disease   . Colon polyps   . GERD (gastroesophageal reflux disease)   . History of kidney stones   . Parkinson disease [facility])     Past Surgical History:  Procedure Laterality Date  . COLON SURGERY    . CYSTOSCOPY WITH LITHOLAPAXY N/A 11/14/2016   Procedure: CYSTOSCOPY WITH LITHOLAPAXY;  Surgeon: Hollice Espy, MD;  Location: [facility] ORS;  Service: Urology;  Laterality: N/A;  . EYE SURGERY Bilateral    Cataract Extraction with IOL  . HERNIA REPAIR  20 years  . MOHS SURGERY    . SMALL INTESTINE SURGERY     Per patient 7 years  . TRANSURETHRAL RESECTION OF PROSTATE N/A 11/14/2016   Procedure: TRANSURETHRAL RESECTION OF THE PROSTATE (TURP);  Surgeon: Hollice Espy, MD;  Location: [facility] ORS;  Service: Urology;  Laterality: N/A;    There were no vitals filed for this visit.  Subjective Assessment - 02/17/18 1851    Subjective   Patient reports his wife and daughter are having a garage sale this weekend. He is anxious about the increased traffic around the house and is dreading the day and will be glad when it is over.     Pertinent History  Patient reports he was diagnosed with  Parkinson's disease about 10 plus years ago.  He reports a more recent decline in function in his daily activities in the last 6-12 months.  He has been seeing PT for the last couple months.    Patient Stated Goals  Patient reports he wants to be able to do more for himself and around the house.     Currently in Pain?  No/denies    Pain Score  0-No pain                   OT Treatments/Exercises (OP) - 02/17/18 1853      ADLs   ADL Comments  Patient seen for mobility and transfers for the bathroom with use of rollator, sit to stand from a variety of surfaces with cues and SBA.      Neurological Re-education Exercises   Other Exercises 1  Patient seen for instruction of LSVT BIG exercises: LSVT Daily Session Maximal Daily Exercises: Sustained movements are designed to rescale the amplitude of movement output for generalization to daily functional activities. Performed as follows for 1 set of 10 repetitions each: Multi directional sustained movements- 1) Floor to ceiling, 2) Side to side. Multi directional Repetitive movements performed in standing and are designed to provide retraining  effort needed for sustained muscle activation in tasks Performed as follows: 3) Step and reach forward, 4) Step and Reach Backwards, 5) Step and reach sideways, 6) Rock and reach forward/backward, 7) Rock and reach sideways.  SBA for most exercises in standing except CGA for stepping backwards exercise, along with verbal cues for proper form and technique.     Other Exercises 2  Patient was seen for focus on posture with exercises in standing at the wall with cues for shoulder retraction and head position. Patient also requires cues during functional mobility to keep head up and eye gaze forwards. Balance exercises in standing with reciprocal leg movements and cues to shift weight. Patient also seen before navigating in crowded areas and turning in small spaces with use of his Rollator.               OT Education - 02/17/18 1852    Education provided  Yes    Education Details  HEP, turns in small spaces, managing in crowds    Person(s) Educated  Patient    Methods  Demonstration;Explanation;Verbal cues    Comprehension  Verbal cues required;Returned demonstration;Verbalized understanding          OT Long Term Goals - 02/17/18 1849      OT LONG TERM GOAL #1   Title  Patient will demonstrate increase right hand grip to be able to cut food with modified independence.     Baseline  occasional assist required.    Time  12    Period  Weeks    Status  Partially Met      OT LONG TERM GOAL #2   Title  Patient will complete donning long sleeve shirt with modified independence.    Baseline  requires assist with long sleeves, can perform short sleeves.    Time  12    Period  Weeks    Status  Achieved      OT LONG TERM GOAL #3   Title  Patient will improve coordination to manage buttons on clothing with occasional assistance only.    Baseline  requires assist with buttons each time    Time  12    Period  Weeks    Status  Partially Met      OT LONG TERM GOAL #4   Title  Patient will complete managing pants after toileting with modified independence.     Baseline  assist most days     Time  12    Period  Weeks    Status  Achieved      OT LONG TERM GOAL #5   Title  Patient will demonstrate turning behaviors with rollator using large steps and keeping feet behind the rollator with all turns with occasional minimal cues.     Time  12    Period  Weeks    Status  On-going      OT LONG TERM GOAL #6   Title  Patient will improve gait speed and endurance and be able to walk 950 feet in 6 minutes to negotiate around the home and community safely in 4 weeks    Baseline  860 at reassessment    Time  4    Period  Weeks    Status  Revised      OT LONG TERM GOAL #7   Title  Patient will complete HEP for maximal daily exercises with modified independence in 4 weeks     Time  12    Period  Weeks    Status  Partially Met      OT LONG TERM GOAL #8   Title  Patient will transfer from sit to stand without the use of arms safely and independently from a variety of chairs/surfaces in 4 weeks.     Baseline  difficulty from lower surfaces    Time  4    Period  Weeks    Status  Achieved      OT LONG TERM GOAL  #9   Baseline  Patient will demonstrate decreased episodes of freezing of behaviors from score of 16 to score of less than 12.    Time  4    Period  Weeks    Status  Achieved            Plan - 02/17/18 1852    Clinical Impression Statement  Patient continues to demonstrate progress with mobility as well as posture which affects his balance. He continues to benefit from repetition of tasks to drive neuro plasticity for movement patterns.    Occupational Profile and client history currently impacting functional performance  progressive disease process, freezing of gait behaviors, fall risk    Occupational performance deficits (Please refer to evaluation for details):  ADL's;IADL's;Leisure    Rehab Potential  Good    Current Impairments/barriers affecting progress:  positive: family support, negative: level of motivation, progressive disease    OT Frequency  2x / week    OT Duration  12 weeks    OT Treatment/Interventions  Self-care/ADL training;Moist Heat;DME and/or AE instruction;Balance training;Therapeutic activities;Therapeutic exercise;Neuromuscular education;Functional Mobility Training;Patient/family education    Consulted and Agree with Plan of Care  Patient       Patient will benefit from skilled therapeutic intervention in order to improve the following deficits and impairments:  Abnormal gait, Decreased coordination, Decreased range of motion, Difficulty walking, Decreased endurance, Decreased safety awareness, Decreased activity tolerance, Decreased balance, Impaired UE functional use, Pain, Decreased mobility, Decreased strength  Visit  Diagnosis: Unsteadiness on feet  Abnormal posture  Difficulty in walking, not elsewhere classified  Other lack of coordination  Muscle weakness (generalized)    Problem List Patient Active Problem List   Diagnosis Date Noted  . BPH with urinary obstruction 11/14/2016  . Urinary frequency 10/10/2016  . Microscopic hematuria 10/10/2016  . Parkinson's syndrome ([place]) 06/14/2016  . Arthritis 06/14/2016  . Depression 06/14/2016  . Heartburn 06/14/2016  . Urinary incontinence 06/14/2016  . Skin lesion 06/14/2016  . Dementia 06/14/2016  . Bilateral edema of lower extremity 06/14/2016   Achilles Dunk, OTR/L, CLT  Lovett,Amy 02/17/2018, 6:55 PM  [place] MAIN [facility] SERVICES [address] [place], [place], [address] Phone: [phone]   Fax:  [phone]  Name: Naythen Heikkila MRN: [id number] Date of Birth: [birth year]

## 2018-02-20 ENCOUNTER — Ambulatory Visit: Payer: Medicare Other | Admitting: Occupational Therapy

## 2018-02-20 DIAGNOSIS — R278 Other lack of coordination: Secondary | ICD-10-CM

## 2018-02-20 DIAGNOSIS — R262 Difficulty in walking, not elsewhere classified: Secondary | ICD-10-CM

## 2018-02-20 DIAGNOSIS — R293 Abnormal posture: Secondary | ICD-10-CM

## 2018-02-20 DIAGNOSIS — R2681 Unsteadiness on feet: Secondary | ICD-10-CM

## 2018-02-20 DIAGNOSIS — M6281 Muscle weakness (generalized): Secondary | ICD-10-CM

## 2018-02-20 NOTE — Therapy (Signed)
[place] MAIN [facility] SERVICES [address] [place], [place], [address] Phone: [phone]   Fax:  [phone]  Occupational Therapy Treatment  Patient Details  Name: Luke Durham MRN: [id number] Date of Birth: 06-Nov-1932 No data recorded  Encounter Date: 02/20/2018    Past Medical History:  Diagnosis Date  . Anxiety   . Arthritis   . Asthma    childhood asthma  . Cancer ([place]) 7 years ago   lymphoma, [place] (recent)  . Chronic kidney disease   . Colon polyps   . GERD (gastroesophageal reflux disease)   . History of kidney stones   . Parkinson disease [facility])     Past Surgical History:  Procedure Laterality Date  . COLON SURGERY    . CYSTOSCOPY WITH LITHOLAPAXY N/A 11/14/2016   Procedure: CYSTOSCOPY WITH LITHOLAPAXY;  Surgeon: Hollice Espy, MD;  Location: [facility] ORS;  Service: Urology;  Laterality: N/A;  . EYE SURGERY Bilateral    Cataract Extraction with IOL  . HERNIA REPAIR  20 years  . MOHS SURGERY    . SMALL INTESTINE SURGERY     Per patient 7 years  . TRANSURETHRAL RESECTION OF PROSTATE N/A 11/14/2016   Procedure: TRANSURETHRAL RESECTION OF THE PROSTATE (TURP);  Surgeon: Hollice Espy, MD;  Location: [facility] ORS;  Service: Urology;  Laterality: N/A;    There were no vitals filed for this visit.                             OT Long Term Goals - 02/17/18 1849      OT LONG TERM GOAL #1   Title  Patient will demonstrate increase right hand grip to be able to cut food with modified independence.     Baseline  occasional assist required.    Time  12    Period  Weeks    Status  Partially Met      OT LONG TERM GOAL #2   Title  Patient will complete donning long sleeve shirt with modified independence.    Baseline  requires assist with long sleeves, can perform short sleeves.    Time  12    Period  Weeks    Status  Achieved      OT LONG TERM GOAL #3   Title  Patient will improve coordination to manage  buttons on clothing with occasional assistance only.    Baseline  requires assist with buttons each time    Time  12    Period  Weeks    Status  Partially Met      OT LONG TERM GOAL #4   Title  Patient will complete managing pants after toileting with modified independence.     Baseline  assist most days     Time  12    Period  Weeks    Status  Achieved      OT LONG TERM GOAL #5   Title  Patient will demonstrate turning behaviors with rollator using large steps and keeping feet behind the rollator with all turns with occasional minimal cues.     Time  12    Period  Weeks    Status  On-going      OT LONG TERM GOAL #6   Title  Patient will improve gait speed and endurance and be able to walk 950 feet in 6 minutes to negotiate around the home and community safely in 4 weeks  Baseline  860 at reassessment    Time  4    Period  Weeks    Status  Revised      OT LONG TERM GOAL #7   Title  Patient will complete HEP for maximal daily exercises with modified independence in 4 weeks    Time  12    Period  Weeks    Status  Partially Met      OT LONG TERM GOAL #8   Title  Patient will transfer from sit to stand without the use of arms safely and independently from a variety of chairs/surfaces in 4 weeks.     Baseline  difficulty from lower surfaces    Time  4    Period  Weeks    Status  Achieved      OT LONG TERM GOAL  #9   Baseline  Patient will demonstrate decreased episodes of freezing of behaviors from score of 16 to score of less than 12.    Time  4    Period  Weeks    Status  Achieved              Patient will benefit from skilled therapeutic intervention in order to improve the following deficits and impairments:     Visit Diagnosis: Unsteadiness on feet  Abnormal posture  Difficulty in walking, not elsewhere classified  Other lack of coordination  Muscle weakness (generalized)    Problem List Patient Active Problem List   Diagnosis Date Noted  .  BPH with urinary obstruction 11/14/2016  . Urinary frequency 10/10/2016  . Microscopic hematuria 10/10/2016  . Parkinson's syndrome ([place]) 06/14/2016  . Arthritis 06/14/2016  . Depression 06/14/2016  . Heartburn 06/14/2016  . Urinary incontinence 06/14/2016  . Skin lesion 06/14/2016  . Dementia 06/14/2016  . Bilateral edema of lower extremity 06/14/2016   Lovett,Amy 02/20/2018, 10:15 PM  [place] MAIN [facility] SERVICES [address] [place], [place], [address] Phone: [phone]   Fax:  [phone]  Name: Luke Durham MRN: [id number] Date of Birth: [birth year]

## 2018-02-22 ENCOUNTER — Ambulatory Visit: Payer: Medicare Other | Attending: Neurology | Admitting: Occupational Therapy

## 2018-02-22 DIAGNOSIS — R262 Difficulty in walking, not elsewhere classified: Secondary | ICD-10-CM | POA: Diagnosis present

## 2018-02-22 DIAGNOSIS — M6281 Muscle weakness (generalized): Secondary | ICD-10-CM | POA: Diagnosis present

## 2018-02-22 DIAGNOSIS — R293 Abnormal posture: Secondary | ICD-10-CM | POA: Diagnosis present

## 2018-02-22 DIAGNOSIS — R278 Other lack of coordination: Secondary | ICD-10-CM | POA: Diagnosis present

## 2018-02-22 DIAGNOSIS — R2681 Unsteadiness on feet: Secondary | ICD-10-CM | POA: Insufficient documentation

## 2018-02-25 ENCOUNTER — Encounter: Payer: Self-pay | Admitting: Occupational Therapy

## 2018-02-25 NOTE — Therapy (Signed)
[place] MAIN [facility] SERVICES [address] [place], [place], [address] Phone: [phone]   Fax:  [phone]  Occupational Therapy Treatment  Patient Details  Name: Akaash Vandewater MRN: [id number] Date of Birth: [birth year] No data recorded  Encounter Date: 02/20/2018  OT End of Session - 02/25/18 1930    Visit Number  107    Number of Visits  132    Date for OT Re-Evaluation  05/02/18    OT Start Time  1100    OT Stop Time  1200    OT Time Calculation (min)  60 min    Activity Tolerance  Patient tolerated treatment well    Behavior During Therapy  [facility] for tasks assessed/performed       Past Medical History:  Diagnosis Date  . Anxiety   . Arthritis   . Asthma    childhood asthma  . Cancer ([place]) 7 years ago   lymphoma, [place] (recent)  . Chronic kidney disease   . Colon polyps   . GERD (gastroesophageal reflux disease)   . History of kidney stones   . Parkinson disease [facility])     Past Surgical History:  Procedure Laterality Date  . COLON SURGERY    . CYSTOSCOPY WITH LITHOLAPAXY N/A 11/14/2016   Procedure: CYSTOSCOPY WITH LITHOLAPAXY;  Surgeon: Hollice Espy, MD;  Location: [facility] ORS;  Service: Urology;  Laterality: N/A;  . EYE SURGERY Bilateral    Cataract Extraction with IOL  . HERNIA REPAIR  20 years  . MOHS SURGERY    . SMALL INTESTINE SURGERY     Per patient 7 years  . TRANSURETHRAL RESECTION OF PROSTATE N/A 11/14/2016   Procedure: TRANSURETHRAL RESECTION OF THE PROSTATE (TURP);  Surgeon: Hollice Espy, MD;  Location: [facility] ORS;  Service: Urology;  Laterality: N/A;    There were no vitals filed for this visit.  Subjective Assessment - 02/25/18 1925    Subjective   Patient reports the garage sale went well, met some interesting people, he did better than he thought and spent some time in the garage with his wife and daughter during the sale.  He is glad it is over now.     Pertinent History  Patient reports he was diagnosed  with Parkinson's disease about 10 plus years ago.  He reports a more recent decline in function in his daily activities in the last 6-12 months.  He has been seeing PT for the last couple months.    Patient Stated Goals  Patient reports he wants to be able to do more for himself and around the house.     Currently in Pain?  No/denies    Pain Score  0-No pain                   OT Treatments/Exercises (OP) - 02/25/18 1926      ADLs   ADL Comments  Patient seen for sit to stand from a variety of surfaces, emphasis on backing up to surface to sit especially with toileting.  Patient requires cues for size of step when moving backwards.  Patient also seen for focus on turning with use of rollator and positioning of self in relation to walker and surface he is approaching.       Neurological Re-education Exercises   Other Exercises 1  Patient seen for instruction of LSVT BIG exercises: LSVT Daily Session Maximal Daily Exercises: Sustained movements are designed to rescale the amplitude of movement output for generalization to  daily functional activities. Performed as follows for 1 set of 10 repetitions each: Multi directional sustained movements- 1) Floor to ceiling, 2) Side to side. Multi directional Repetitive movements performed in standing and are designed to provide retraining effort needed for sustained muscle activation in tasks Performed as follows: 3) Step and reach forward, 4) Step and Reach Backwards, 5) Step and reach sideways, 6) Rock and reach forward/backward, 7) Rock and reach sideways.  SBA for most exercises in standing except CGA for stepping backwards exercise, along with verbal cues for proper form and technique.   Patient continues to require assist for balance when performing exercises in a standard version.     Other Exercises 2  Patient seen for functional mobility for 2 trials of 400 feet with cues for amplitude of steps, turns and navigating in smaller spaces.               OT Education - 02/25/18 1930    Education provided  Yes    Education Details  standard LSVT BIG exercises, balance, use of rollator, safety    Person(s) Educated  Patient    Methods  Explanation;Verbal cues;Demonstration    Comprehension  Verbalized understanding;Returned demonstration;Verbal cues required          OT Long Term Goals - 02/17/18 1849      OT LONG TERM GOAL #1   Title  Patient will demonstrate increase right hand grip to be able to cut food with modified independence.     Baseline  occasional assist required.    Time  12    Period  Weeks    Status  Partially Met      OT LONG TERM GOAL #2   Title  Patient will complete donning long sleeve shirt with modified independence.    Baseline  requires assist with long sleeves, can perform short sleeves.    Time  12    Period  Weeks    Status  Achieved      OT LONG TERM GOAL #3   Title  Patient will improve coordination to manage buttons on clothing with occasional assistance only.    Baseline  requires assist with buttons each time    Time  12    Period  Weeks    Status  Partially Met      OT LONG TERM GOAL #4   Title  Patient will complete managing pants after toileting with modified independence.     Baseline  assist most days     Time  12    Period  Weeks    Status  Achieved      OT LONG TERM GOAL #5   Title  Patient will demonstrate turning behaviors with rollator using large steps and keeping feet behind the rollator with all turns with occasional minimal cues.     Time  12    Period  Weeks    Status  On-going      OT LONG TERM GOAL #6   Title  Patient will improve gait speed and endurance and be able to walk 950 feet in 6 minutes to negotiate around the home and community safely in 4 weeks    Baseline  860 at reassessment    Time  4    Period  Weeks    Status  Revised      OT LONG TERM GOAL #7   Title  Patient will complete HEP for maximal daily exercises with modified independence  in 4 weeks  Time  12    Period  Weeks    Status  Partially Met      OT LONG TERM GOAL #8   Title  Patient will transfer from sit to stand without the use of arms safely and independently from a variety of chairs/surfaces in 4 weeks.     Baseline  difficulty from lower surfaces    Time  4    Period  Weeks    Status  Achieved      OT LONG TERM GOAL  #9   Baseline  Patient will demonstrate decreased episodes of freezing of behaviors from score of 16 to score of less than 12.    Time  4    Period  Weeks    Status  Achieved            Plan - 02/25/18 1931    Clinical Impression Statement  Patient working towards calibration of movement with turns using rollator, focused this date on approaching surfaces, amplitude of turns and positioning of self in relation to walker.  Patient with minimal freezing behaviors this date and does better in familiar places rather than unfamiliar environments.     Occupational Profile and client history currently impacting functional performance  progressive disease process, freezing of gait behaviors, fall risk    Occupational performance deficits (Please refer to evaluation for details):  ADL's;IADL's;Leisure    Rehab Potential  Good       Patient will benefit from skilled therapeutic intervention in order to improve the following deficits and impairments:  Abnormal gait, Decreased coordination, Decreased range of motion, Difficulty walking, Decreased endurance, Decreased safety awareness, Decreased activity tolerance, Decreased balance, Impaired UE functional use, Pain, Decreased mobility, Decreased strength  Visit Diagnosis: Unsteadiness on feet  Abnormal posture  Difficulty in walking, not elsewhere classified  Other lack of coordination  Muscle weakness (generalized)    Problem List Patient Active Problem List   Diagnosis Date Noted  . BPH with urinary obstruction 11/14/2016  . Urinary frequency 10/10/2016  . Microscopic hematuria  10/10/2016  . Parkinson's syndrome ([place]) 06/14/2016  . Arthritis 06/14/2016  . Depression 06/14/2016  . Heartburn 06/14/2016  . Urinary incontinence 06/14/2016  . Skin lesion 06/14/2016  . Dementia 06/14/2016  . Bilateral edema of lower extremity 06/14/2016   Achilles Dunk, OTR/L, CLT  Lovett,Amy 02/25/2018, 7:33 PM  [place] MAIN [facility] SERVICES [address] [place], [place], [address] Phone: [phone]   Fax:  [phone]  Name: Absalom Aro MRN: [id number] Date of Birth: December 10, 1932

## 2018-02-25 NOTE — Addendum Note (Signed)
Addended by: Garlon Hatchet T on: 02/25/2018 07:17 PM   Modules accepted: Orders

## 2018-02-26 ENCOUNTER — Ambulatory Visit: Payer: Medicare Other | Admitting: Occupational Therapy

## 2018-02-26 DIAGNOSIS — R2681 Unsteadiness on feet: Secondary | ICD-10-CM | POA: Diagnosis not present

## 2018-02-26 DIAGNOSIS — R278 Other lack of coordination: Secondary | ICD-10-CM

## 2018-02-26 DIAGNOSIS — R293 Abnormal posture: Secondary | ICD-10-CM

## 2018-02-26 DIAGNOSIS — R262 Difficulty in walking, not elsewhere classified: Secondary | ICD-10-CM

## 2018-02-26 DIAGNOSIS — M6281 Muscle weakness (generalized): Secondary | ICD-10-CM

## 2018-02-27 ENCOUNTER — Encounter: Payer: Medicare Other | Admitting: Occupational Therapy

## 2018-03-01 ENCOUNTER — Encounter: Payer: Self-pay | Admitting: Occupational Therapy

## 2018-03-01 ENCOUNTER — Ambulatory Visit: Payer: Medicare Other | Admitting: Occupational Therapy

## 2018-03-01 DIAGNOSIS — M6281 Muscle weakness (generalized): Secondary | ICD-10-CM

## 2018-03-01 DIAGNOSIS — R2681 Unsteadiness on feet: Secondary | ICD-10-CM

## 2018-03-01 DIAGNOSIS — R278 Other lack of coordination: Secondary | ICD-10-CM

## 2018-03-01 DIAGNOSIS — R293 Abnormal posture: Secondary | ICD-10-CM

## 2018-03-01 DIAGNOSIS — R262 Difficulty in walking, not elsewhere classified: Secondary | ICD-10-CM

## 2018-03-01 NOTE — Therapy (Signed)
Luke Durham MAIN [facility] SERVICES [address] [place], [place], [address] Phone: [phone]   Fax:  [phone]  Occupational Therapy Treatment  Patient Details  Name: Luke Durham MRN: [id number] Date of Birth: 1933-02-28 No data recorded  Encounter Date: 02/22/2018  OT End of Session - 03/01/18 2154    Visit Number  108    Number of Visits  132    Date for OT Re-Evaluation  05/02/18    OT Start Time  1400    OT Stop Time  1500    OT Time Calculation (min)  60 min    Activity Tolerance  Patient tolerated treatment well    Behavior During Therapy  [facility] for tasks assessed/performed       Past Medical History:  Diagnosis Date  . Anxiety   . Arthritis   . Asthma    childhood asthma  . Cancer ([place]) 7 years ago   lymphoma, [place] (recent)  . Chronic kidney disease   . Colon polyps   . GERD (gastroesophageal reflux disease)   . History of kidney stones   . Parkinson disease [facility])     Past Surgical History:  Procedure Laterality Date  . COLON SURGERY    . CYSTOSCOPY WITH LITHOLAPAXY N/A 11/14/2016   Procedure: CYSTOSCOPY WITH LITHOLAPAXY;  Surgeon: Hollice Espy, MD;  Location: [facility] ORS;  Service: Urology;  Laterality: N/A;  . EYE SURGERY Bilateral    Cataract Extraction with IOL  . HERNIA REPAIR  20 years  . MOHS SURGERY    . SMALL INTESTINE SURGERY     Per patient 7 years  . TRANSURETHRAL RESECTION OF PROSTATE N/A 11/14/2016   Procedure: TRANSURETHRAL RESECTION OF THE PROSTATE (TURP);  Surgeon: Hollice Espy, MD;  Location: [facility] ORS;  Service: Urology;  Laterality: N/A;    There were no vitals filed for this visit.  Subjective Assessment - 03/01/18 2152    Subjective   Pt reports he is doing well, has been working on exercises at home but still gets some of the exercises confused.    Pertinent History  Patient reports he was diagnosed with Parkinson's disease about 10 plus years ago.  He reports a more recent decline in function  in his daily activities in the last 6-12 months.  He has been seeing PT for the last couple months.    Patient Stated Goals  Patient reports he wants to be able to do more for himself and around the house.     Currently in Pain?  No/denies    Pain Score  0-No pain                   OT Treatments/Exercises (OP) - 03/01/18 2156      ADLs   ADL Comments  Pt seen for focus on toileting skills with mobility  to and from the bathroom, approaching surfaces, transfers from sit to stand from lower surface with cues.  Cues for posture when moving and during transfers      Neurological Re-education Exercises   Other Exercises 1  Patient seen for instruction of LSVT BIG exercises: LSVT Daily Session Maximal Daily Exercises: Sustained movements are designed to rescale the amplitude of movement output for generalization to daily functional activities. Performed as follows for 1 set of 10 repetitions each: Multi directional sustained movements- 1) Floor to ceiling, 2) Side to side. Multi directional Repetitive movements performed in standing and are designed to provide retraining effort needed for sustained muscle  activation in tasks Performed as follows: 3) Step and reach forward, 4) Step and Reach Backwards, 5) Step and reach sideways, 6) Rock and reach forward/backward, 7) Rock and reach sideways.  SBA for most exercises in standing except CGA for stepping backwards exercise, along with verbal cues for proper form and technique.   Patient continues to require assist for balance when performing exercises in a standard version.     Other Exercises 2  Patient seen for functional mobility skills with emphasis on turning in small spaces with cues for body position in relation to assistive device.  Patient ambulating in crowded space of cafeteria with emphasis on moving around people without stopping and hesitating to wait for the person to pass.              OT Education - 03/01/18 2154     Education provided  Yes    Education Details  stepping patterns, posture exercises    Person(s) Educated  Patient    Methods  Explanation;Demonstration;Verbal cues    Comprehension  Verbal cues required;Returned demonstration;Verbalized understanding          OT Long Term Goals - 02/17/18 1849      OT LONG TERM GOAL #1   Title  Patient will demonstrate increase right hand grip to be able to cut food with modified independence.     Baseline  occasional assist required.    Time  12    Period  Weeks    Status  Partially Met      OT LONG TERM GOAL #2   Title  Patient will complete donning long sleeve shirt with modified independence.    Baseline  requires assist with long sleeves, can perform short sleeves.    Time  12    Period  Weeks    Status  Achieved      OT LONG TERM GOAL #3   Title  Patient will improve coordination to manage buttons on clothing with occasional assistance only.    Baseline  requires assist with buttons each time    Time  12    Period  Weeks    Status  Partially Met      OT LONG TERM GOAL #4   Title  Patient will complete managing pants after toileting with modified independence.     Baseline  assist most days     Time  12    Period  Weeks    Status  Achieved      OT LONG TERM GOAL #5   Title  Patient will demonstrate turning behaviors with rollator using large steps and keeping feet behind the rollator with all turns with occasional minimal cues.     Time  12    Period  Weeks    Status  On-going      OT LONG TERM GOAL #6   Title  Patient will improve gait speed and endurance and be able to walk 950 feet in 6 minutes to negotiate around the home and community safely in 4 weeks    Baseline  860 at reassessment    Time  4    Period  Weeks    Status  Revised      OT LONG TERM GOAL #7   Title  Patient will complete HEP for maximal daily exercises with modified independence in 4 weeks    Time  12    Period  Weeks    Status  Partially Met  OT LONG TERM GOAL #8   Title  Patient will transfer from sit to stand without the use of arms safely and independently from a variety of chairs/surfaces in 4 weeks.     Baseline  difficulty from lower surfaces    Time  4    Period  Weeks    Status  Achieved      OT LONG TERM GOAL  #9   Baseline  Patient will demonstrate decreased episodes of freezing of behaviors from score of 16 to score of less than 12.    Time  4    Period  Weeks    Status  Achieved            Plan - 03/01/18 2155    Clinical Impression Statement  Patient continues to demonstrate difficulty at times with turning in small spaces, he tends to move away from his assistive device and requires verbal cues to step behind the rollator and keep it closer to him.  He has demonstrated decreased freezing of gait when in crowded spaces in the hospital environment however he and wife report he demonstrates it more when in an unfamiliar environment.  Now that he has performed it many times in this setting, the stimulus has decreased for this behavior.  Will continue to challenge patient in other areas of the hospital setting.      Occupational Profile and client history currently impacting functional performance  progressive disease process, freezing of gait behaviors, fall risk    Occupational performance deficits (Please refer to evaluation for details):  ADL's;IADL's;Leisure    Rehab Potential  Good    Current Impairments/barriers affecting progress:  positive: family support, negative: level of motivation, progressive disease    OT Frequency  2x / week    OT Duration  12 weeks    OT Treatment/Interventions  Self-care/ADL training;Moist Heat;DME and/or AE instruction;Balance training;Therapeutic activities;Therapeutic exercise;Neuromuscular education;Functional Mobility Training;Patient/family education    Consulted and Agree with Plan of Care  Patient       Patient will benefit from skilled therapeutic intervention in order  to improve the following deficits and impairments:  Abnormal gait, Decreased coordination, Decreased range of motion, Difficulty walking, Decreased endurance, Decreased safety awareness, Decreased activity tolerance, Decreased balance, Impaired UE functional use, Pain, Decreased mobility, Decreased strength  Visit Diagnosis: Unsteadiness on feet  Abnormal posture  Difficulty in walking, not elsewhere classified  Other lack of coordination  Muscle weakness (generalized)    Problem List Patient Active Problem List   Diagnosis Date Noted  . BPH with urinary obstruction 11/14/2016  . Urinary frequency 10/10/2016  . Microscopic hematuria 10/10/2016  . Parkinson's syndrome ([place]) 06/14/2016  . Arthritis 06/14/2016  . Depression 06/14/2016  . Heartburn 06/14/2016  . Urinary incontinence 06/14/2016  . Skin lesion 06/14/2016  . Dementia 06/14/2016  . Bilateral edema of lower extremity 06/14/2016   Achilles Dunk, OTR/L, CLT  Luke Durham,Luke Durham 03/01/2018, 10:07 PM  [place] MAIN [facility] SERVICES [address] [place], [place], [address] Phone: [phone]   Fax:  [phone]  Name: Luke Durham MRN: [id number] Date of Birth: 07-09-1933

## 2018-03-01 NOTE — Therapy (Signed)
[place] MAIN [facility] SERVICES [address] [place], [place], [address] Phone: [phone]   Fax:  [phone]  Occupational Therapy Treatment  Patient Details  Name: Luke Durham MRN: [id number] Date of Birth: 06-19-1933 No data recorded  Encounter Date: 02/26/2018  OT End of Session - 03/01/18 2210    Visit Number  109    Number of Visits  132    Date for OT Re-Evaluation  05/02/18    OT Start Time  1101    OT Stop Time  1200    OT Time Calculation (min)  59 min    Activity Tolerance  Patient tolerated treatment well    Behavior During Therapy  [facility] for tasks assessed/performed       Past Medical History:  Diagnosis Date  . Anxiety   . Arthritis   . Asthma    childhood asthma  . Cancer ([place]) 7 years ago   lymphoma, [place] (recent)  . Chronic kidney disease   . Colon polyps   . GERD (gastroesophageal reflux disease)   . History of kidney stones   . Parkinson disease [facility])     Past Surgical History:  Procedure Laterality Date  . COLON SURGERY    . CYSTOSCOPY WITH LITHOLAPAXY N/A 11/14/2016   Procedure: CYSTOSCOPY WITH LITHOLAPAXY;  Surgeon: Hollice Espy, MD;  Location: [facility] ORS;  Service: Urology;  Laterality: N/A;  . EYE SURGERY Bilateral    Cataract Extraction with IOL  . HERNIA REPAIR  20 years  . MOHS SURGERY    . SMALL INTESTINE SURGERY     Per patient 7 years  . TRANSURETHRAL RESECTION OF PROSTATE N/A 11/14/2016   Procedure: TRANSURETHRAL RESECTION OF THE PROSTATE (TURP);  Surgeon: Hollice Espy, MD;  Location: [facility] ORS;  Service: Urology;  Laterality: N/A;    There were no vitals filed for this visit.  Subjective Assessment - 03/01/18 2208    Subjective   Patient reports he had a good weekend, did exercises but did not walk as much since it was raining, they stayed inside a lot of the weekend.     Pertinent History  Patient reports he was diagnosed with Parkinson's disease about 10 plus years ago.  He reports a  more recent decline in function in his daily activities in the last 6-12 months.  He has been seeing PT for the last couple months.    Patient Stated Goals  Patient reports he wants to be able to do more for himself and around the house.     Currently in Pain?  No/denies    Pain Score  0-No pain                   OT Treatments/Exercises (OP) - 03/01/18 2211      ADLs   ADL Comments  Patient seen for crossing legs to reach to feet for donning socks and shoes, prolonged stretch for hip to increase flexibility to increase participation in lower body dressing.  Patient seen for navigating in small spaces moving in and out of small spaces such as a closet or small bathroom with cues for body positioning and use of rollator.       Neurological Re-education Exercises   Other Exercises 1  Patient seen for instruction of LSVT BIG exercises: LSVT Daily Session Maximal Daily Exercises: Sustained movements are designed to rescale the amplitude of movement output for generalization to daily functional activities. Performed as follows for 1 set of 10 repetitions  each: Multi directional sustained movements- 1) Floor to ceiling, 2) Side to side. Multi directional Repetitive movements performed in standing and are designed to provide retraining effort needed for sustained muscle activation in tasks Performed as follows: 3) Step and reach forward, 4) Step and Reach Backwards, 5) Step and reach sideways, 6) Rock and reach forward/backward, 7) Rock and reach sideways.  SBA for most exercises in standing except CGA for stepping backwards exercise, along with verbal cues for proper form and technique.   Patient continues to require assist for balance when performing exercises in a standard version.     Other Exercises 2  Patient seen for functional mobility skills for 500 feet for 2 sets with use of rollator, cues for amplitude of gait and posture while walking.               OT Education - 03/01/18  2209    Education provided  Yes    Education Details  standard exercises for big movements, navigating in smaller spaces such as bathroom, closet    Person(s) Educated  Patient    Methods  Explanation;Demonstration;Verbal cues    Comprehension  Verbalized understanding;Returned demonstration;Verbal cues required          OT Long Term Goals - 02/17/18 1849      OT LONG TERM GOAL #1   Title  Patient will demonstrate increase right hand grip to be able to cut food with modified independence.     Baseline  occasional assist required.    Time  12    Period  Weeks    Status  Partially Met      OT LONG TERM GOAL #2   Title  Patient will complete donning long sleeve shirt with modified independence.    Baseline  requires assist with long sleeves, can perform short sleeves.    Time  12    Period  Weeks    Status  Achieved      OT LONG TERM GOAL #3   Title  Patient will improve coordination to manage buttons on clothing with occasional assistance only.    Baseline  requires assist with buttons each time    Time  12    Period  Weeks    Status  Partially Met      OT LONG TERM GOAL #4   Title  Patient will complete managing pants after toileting with modified independence.     Baseline  assist most days     Time  12    Period  Weeks    Status  Achieved      OT LONG TERM GOAL #5   Title  Patient will demonstrate turning behaviors with rollator using large steps and keeping feet behind the rollator with all turns with occasional minimal cues.     Time  12    Period  Weeks    Status  On-going      OT LONG TERM GOAL #6   Title  Patient will improve gait speed and endurance and be able to walk 950 feet in 6 minutes to negotiate around the home and community safely in 4 weeks    Baseline  860 at reassessment    Time  4    Period  Weeks    Status  Revised      OT LONG TERM GOAL #7   Title  Patient will complete HEP for maximal daily exercises with modified independence in 4 weeks     Time  12    Period  Weeks    Status  Partially Met      OT LONG TERM GOAL #8   Title  Patient will transfer from sit to stand without the use of arms safely and independently from a variety of chairs/surfaces in 4 weeks.     Baseline  difficulty from lower surfaces    Time  4    Period  Weeks    Status  Achieved      OT LONG TERM GOAL  #9   Baseline  Patient will demonstrate decreased episodes of freezing of behaviors from score of 16 to score of less than 12.    Time  4    Period  Weeks    Status  Achieved            Plan - 03/01/18 2210    Clinical Impression Statement  Patient performs best when he is not rushed or timed with performance, when he is relaxed and in a more familiar environment, he demonstrates decreased freezing/hesitations and does better with his performance of turning behaviors when he has increased space and time. Continue to work towards goals to improve balance, functional mobility, safety and independence in daily tasks.     Occupational Profile and client history currently impacting functional performance  progressive disease process, freezing of gait behaviors, fall risk    Occupational performance deficits (Please refer to evaluation for details):  ADL's;IADL's;Leisure    Rehab Potential  Good    Current Impairments/barriers affecting progress:  positive: family support, negative: level of motivation, progressive disease    OT Frequency  2x / week    OT Duration  12 weeks    OT Treatment/Interventions  Self-care/ADL training;Moist Heat;DME and/or AE instruction;Balance training;Therapeutic activities;Therapeutic exercise;Neuromuscular education;Functional Mobility Training;Patient/family education    Consulted and Agree with Plan of Care  Patient       Patient will benefit from skilled therapeutic intervention in order to improve the following deficits and impairments:  Abnormal gait, Decreased coordination, Decreased range of motion, Difficulty  walking, Decreased endurance, Decreased safety awareness, Decreased activity tolerance, Decreased balance, Impaired UE functional use, Pain, Decreased mobility, Decreased strength  Visit Diagnosis: Unsteadiness on feet  Abnormal posture  Difficulty in walking, not elsewhere classified  Other lack of coordination  Muscle weakness (generalized)    Problem List Patient Active Problem List   Diagnosis Date Noted  . BPH with urinary obstruction 11/14/2016  . Urinary frequency 10/10/2016  . Microscopic hematuria 10/10/2016  . Parkinson's syndrome ([place]) 06/14/2016  . Arthritis 06/14/2016  . Depression 06/14/2016  . Heartburn 06/14/2016  . Urinary incontinence 06/14/2016  . Skin lesion 06/14/2016  . Dementia 06/14/2016  . Bilateral edema of lower extremity 06/14/2016   Achilles Dunk, OTR/L, CLT  Elycia Woodside 03/01/2018, 10:21 PM  [place] MAIN [facility] SERVICES [address] [place], [place], [address] Phone: [phone]   Fax:  [phone]  Name: Luke Durham MRN: [id number] Date of Birth: Dec 12, 1932

## 2018-03-02 ENCOUNTER — Encounter: Payer: Self-pay | Admitting: Occupational Therapy

## 2018-03-02 NOTE — Therapy (Signed)
[place] MAIN [facility] SERVICES [address] [place], [place], [address] Phone: [phone]   Fax:  [phone]  Occupational Therapy Treatment/Progress Note Period 01/16/2018 to 03/01/2018  Patient Details  Name: Luke Durham MRN: [id number] Date of Birth: [birth year] No data recorded  Encounter Date: 03/01/2018  OT End of Session - 03/02/18 1038    Visit Number  110    Number of Visits  132    Date for OT Re-Evaluation  05/02/18    OT Start Time  1100    OT Stop Time  1158    OT Time Calculation (min)  58 min    Activity Tolerance  Patient tolerated treatment well    Behavior During Therapy  [facility] for tasks assessed/performed       Past Medical History:  Diagnosis Date  . Anxiety   . Arthritis   . Asthma    childhood asthma  . Cancer ([place]) 7 years ago   lymphoma, [place] (recent)  . Chronic kidney disease   . Colon polyps   . GERD (gastroesophageal reflux disease)   . History of kidney stones   . Parkinson disease [facility])     Past Surgical History:  Procedure Laterality Date  . COLON SURGERY    . CYSTOSCOPY WITH LITHOLAPAXY N/A 11/14/2016   Procedure: CYSTOSCOPY WITH LITHOLAPAXY;  Surgeon: Hollice Espy, MD;  Location: [facility] ORS;  Service: Urology;  Laterality: N/A;  . EYE SURGERY Bilateral    Cataract Extraction with IOL  . HERNIA REPAIR  20 years  . MOHS SURGERY    . SMALL INTESTINE SURGERY     Per patient 7 years  . TRANSURETHRAL RESECTION OF PROSTATE N/A 11/14/2016   Procedure: TRANSURETHRAL RESECTION OF THE PROSTATE (TURP);  Surgeon: Hollice Espy, MD;  Location: [facility] ORS;  Service: Urology;  Laterality: N/A;    There were no vitals filed for this visit.  Subjective Assessment - 03/02/18 1037    Subjective   Patient reports he will be taking a couple weeks off from coming to therapy and will focus on exercises at home and then return for reassessment.     Pertinent History  Patient reports he was diagnosed with Parkinson's  disease about 10 plus years ago.  He reports a more recent decline in function in his daily activities in the last 6-12 months.  He has been seeing PT for the last couple months.    Patient Stated Goals  Patient reports he wants to be able to do more for himself and around the house.     Currently in Pain?  No/denies    Pain Score  0-No pain    Multiple Pain Sites  No                   OT Treatments/Exercises (OP) - 03/01/18 2211      ADLs   ADL Comments  Patient seen for crossing legs to reach to feet for donning socks and shoes, prolonged stretch for hip to increase flexibility to increase participation in lower body dressing.  Patient seen for navigating in small spaces moving in and out of small spaces such as a closet or small bathroom with cues for body positioning and use of rollator.       Neurological Re-education Exercises   Other Exercises 1  Patient seen for instruction of LSVT BIG exercises: LSVT Daily Session Maximal Daily Exercises: Sustained movements are designed to rescale the amplitude of movement output for generalization to  daily functional activities. Performed as follows for 1 set of 10 repetitions each: Multi directional sustained movements- 1) Floor to ceiling, 2) Side to side. Multi directional Repetitive movements performed in standing and are designed to provide retraining effort needed for sustained muscle activation in tasks Performed as follows: 3) Step and reach forward, 4) Step and Reach Backwards, 5) Step and reach sideways, 6) Rock and reach forward/backward, 7) Rock and reach sideways.  SBA for most exercises in standing except CGA for stepping backwards exercise, along with verbal cues for proper form and technique.   Patient continues to require assist for balance when performing exercises in a standard version.     Other Exercises 2  Patient seen for functional mobility skills for 500 feet for 2 sets with use of rollator, cues for amplitude of gait  and posture while walking.           Refer to recent recertification for outcome measure details.      OT Education - 03/02/18 1038    Education provided  Yes    Education Details  HEP, posture exercises, ball exercises for bilateral UE    Person(s) Educated  Patient    Methods  Explanation;Demonstration;Verbal cues    Comprehension  Verbal cues required;Returned demonstration;Verbalized understanding          OT Long Term Goals - 03/02/18 1039      OT LONG TERM GOAL #1   Title  Patient will demonstrate increase right hand grip to be able to cut food with modified independence.     Baseline  occasional assist required.    Time  12    Period  Weeks    Status  Partially Met      OT LONG TERM GOAL #2   Title  Patient will complete donning long sleeve shirt with modified independence.    Baseline  requires assist with long sleeves, can perform short sleeves.    Time  12    Period  Weeks    Status  Achieved      OT LONG TERM GOAL #3   Title  Patient will improve coordination to manage buttons on clothing with occasional assistance only.    Baseline  requires assist with buttons each time    Time  12    Period  Weeks    Status  Partially Met      OT LONG TERM GOAL #4   Title  Patient will complete managing pants after toileting with modified independence.     Baseline  assist most days     Time  12    Period  Weeks    Status  Achieved      OT LONG TERM GOAL #5   Title  Patient will demonstrate turning behaviors with rollator using large steps and keeping feet behind the rollator with all turns with occasional minimal cues.     Time  12    Period  Weeks    Status  On-going      OT LONG TERM GOAL #6   Title  Patient will improve gait speed and endurance and be able to walk 950 feet in 6 minutes to negotiate around the home and community safely in 4 weeks    Baseline  860 at reassessment    Time  4    Period  Weeks    Status  Revised      OT LONG TERM GOAL #7    Title  Patient will complete  HEP for maximal daily exercises with modified independence in 4 weeks    Time  12    Period  Weeks    Status  Partially Met      OT LONG TERM GOAL #8   Title  Patient will transfer from sit to stand without the use of arms safely and independently from a variety of chairs/surfaces in 4 weeks.     Baseline  difficulty from lower surfaces    Time  4    Period  Weeks    Status  Achieved      OT LONG TERM GOAL  #9   Baseline  Patient will demonstrate decreased episodes of freezing of behaviors from score of 16 to score of less than 12.    Time  4    Period  Weeks    Status  Achieved            Plan - 03/02/18 1038    Clinical Impression Statement  Discussed with patient regarding taking a break from coming to therapy for a couple weeks, continue with exercises at home and then return for reassessment to see if he is able to maintain results with performing exercises at home and determine further needs for therapy.  He is able to demonstrate home program in an adapted version with the use of a chair for balance and wife present to provide supervision for safety.  He has been able to show some progress with balance during exercises in the clinic with therapist assist to perform in a more standard version but is not safe at this point to complete at home without assist.  Will continue to work towards goals and will plan reassess next session to determine how he is doing after a short break from coming to therapy.      Occupational Profile and client history currently impacting functional performance  progressive disease process, freezing of gait behaviors, fall risk    Occupational performance deficits (Please refer to evaluation for details):  ADL's;IADL's;Leisure    Rehab Potential  Good    Current Impairments/barriers affecting progress:  positive: family support, negative: level of motivation, progressive disease    OT Frequency  2x / week    OT Duration   12 weeks    OT Treatment/Interventions  Self-care/ADL training;Moist Heat;DME and/or AE instruction;Balance training;Therapeutic activities;Therapeutic exercise;Neuromuscular education;Functional Mobility Training;Patient/family education    Consulted and Agree with Plan of Care  Patient       Patient will benefit from skilled therapeutic intervention in order to improve the following deficits and impairments:  Abnormal gait, Decreased coordination, Decreased range of motion, Difficulty walking, Decreased endurance, Decreased safety awareness, Decreased activity tolerance, Decreased balance, Impaired UE functional use, Pain, Decreased mobility, Decreased strength  Visit Diagnosis: Unsteadiness on feet  Abnormal posture  Difficulty in walking, not elsewhere classified  Other lack of coordination  Muscle weakness (generalized)    Problem List Patient Active Problem List   Diagnosis Date Noted  . BPH with urinary obstruction 11/14/2016  . Urinary frequency 10/10/2016  . Microscopic hematuria 10/10/2016  . Parkinson's syndrome ([place]) 06/14/2016  . Arthritis 06/14/2016  . Depression 06/14/2016  . Heartburn 06/14/2016  . Urinary incontinence 06/14/2016  . Skin lesion 06/14/2016  . Dementia 06/14/2016  . Bilateral edema of lower extremity 06/14/2016   Achilles Dunk, OTR/L, CLT  Lovett,Amy 03/02/2018, 10:43 AM  [place] MAIN [facility] SERVICES [address] [place], [place], [address] Phone: [phone]  Fax:  [phone]  Name: Luke Durham MRN: [id number] Date of Birth: 02/22/1933

## 2018-03-08 ENCOUNTER — Ambulatory Visit (INDEPENDENT_AMBULATORY_CARE_PROVIDER_SITE_OTHER): Payer: Medicare Other | Admitting: Physician Assistant

## 2018-03-08 ENCOUNTER — Ambulatory Visit (INDEPENDENT_AMBULATORY_CARE_PROVIDER_SITE_OTHER): Payer: Medicare Other

## 2018-03-08 ENCOUNTER — Encounter: Payer: Self-pay | Admitting: Physician Assistant

## 2018-03-08 VITALS — BP 102/62 | HR 65 | Temp 98.6°F | Resp 16 | Ht 67.0 in | Wt 166.0 lb

## 2018-03-08 VITALS — BP 92/58 | HR 65 | Temp 98.6°F | Ht 67.0 in | Wt 166.6 lb

## 2018-03-08 DIAGNOSIS — Z0000 Encounter for general adult medical examination without abnormal findings: Secondary | ICD-10-CM

## 2018-03-08 DIAGNOSIS — G903 Multi-system degeneration of the autonomic nervous system: Secondary | ICD-10-CM

## 2018-03-08 DIAGNOSIS — F0280 Dementia in other diseases classified elsewhere without behavioral disturbance: Secondary | ICD-10-CM | POA: Diagnosis not present

## 2018-03-08 DIAGNOSIS — G20 Parkinson's disease: Secondary | ICD-10-CM

## 2018-03-08 DIAGNOSIS — B353 Tinea pedis: Secondary | ICD-10-CM | POA: Diagnosis not present

## 2018-03-08 MED ORDER — ECONAZOLE NITRATE 1 % EX CREA: 1.0000 "application " | TOPICAL_CREAM | Freq: Every day | CUTANEOUS | 0 refills | Status: DC

## 2018-03-08 NOTE — Patient Instructions (Signed)

## 2018-03-08 NOTE — Patient Instructions (Signed)
Luke Durham , Thank you for taking time to come for your Medicare Wellness Visit. I appreciate your ongoing commitment to your health goals. Please review the following plan we discussed and let me know if I can assist you in the future.   Screening recommendations/referrals: Colonoscopy: N/A Recommended yearly ophthalmology/optometry visit for glaucoma screening and checkup Recommended yearly dental visit for hygiene and checkup  Vaccinations: Influenza vaccine: N/A Pneumococcal vaccine: Up to date Tdap vaccine: Pt declines today.  Shingles vaccine: Pt declines today.     Advanced directives: Please bring a copy of your POA (Power of Attorney) and/or Living Will to your next appointment.   Conditions/risks identified: Recommend increasing water intake to 4-6 glasses a day.   Next appointment: 3:00 PM today with Fenton Malling.  Preventive Care 45 Years and Older, Male Preventive care refers to lifestyle choices and visits with your health care provider that can promote health and wellness. What does preventive care include?  A yearly physical exam. This is also called an annual well check.  Dental exams once or twice a year.  Routine eye exams. Ask your health care provider how often you should have your eyes checked.  Personal lifestyle choices, including:  Daily care of your teeth and gums.  Regular physical activity.  Eating a healthy diet.  Avoiding tobacco and drug use.  Limiting alcohol use.  Practicing safe sex.  Taking low doses of aspirin every day.  Taking vitamin and mineral supplements as recommended by your health care provider. What happens during an annual well check? The services and screenings done by your health care provider during your annual well check will depend on your age, overall health, lifestyle risk factors, and family history of disease. Counseling  Your health care provider may ask you questions about your:  Alcohol  use.  Tobacco use.  Drug use.  Emotional well-being.  Home and relationship well-being.  Sexual activity.  Eating habits.  History of falls.  Memory and ability to understand (cognition).  Work and work Statistician. Screening  You may have the following tests or measurements:  Height, weight, and BMI.  Blood pressure.  Lipid and cholesterol levels. These may be checked every 5 years, or more frequently if you are over 18 years old.  Skin check.  Lung cancer screening. You may have this screening every year starting at age 92 if you have a 30-pack-year history of smoking and currently smoke or have quit within the past 15 years.  Fecal occult blood test (FOBT) of the stool. You may have this test every year starting at age 42.  Flexible sigmoidoscopy or colonoscopy. You may have a sigmoidoscopy every 5 years or a colonoscopy every 10 years starting at age 69.  Prostate cancer screening. Recommendations will vary depending on your family history and other risks.  Hepatitis C blood test.  Hepatitis B blood test.  Sexually transmitted disease (STD) testing.  Diabetes screening. This is done by checking your blood sugar (glucose) after you have not eaten for a while (fasting). You may have this done every 1-3 years.  Abdominal aortic aneurysm (AAA) screening. You may need this if you are a current or former smoker.  Osteoporosis. You may be screened starting at age 73 if you are at high risk. Talk with your health care provider about your test results, treatment options, and if necessary, the need for more tests. Vaccines  Your health care provider may recommend certain vaccines, such as:  Influenza vaccine. This is  recommended every year.  Tetanus, diphtheria, and acellular pertussis (Tdap, Td) vaccine. You may need a Td booster every 10 years.  Zoster vaccine. You may need this after age 42.  Pneumococcal 13-valent conjugate (PCV13) vaccine. One dose is  recommended after age 42.  Pneumococcal polysaccharide (PPSV23) vaccine. One dose is recommended after age 57. Talk to your health care provider about which screenings and vaccines you need and how often you need them. This information is not intended to replace advice given to you by your health care provider. Make sure you discuss any questions you have with your health care provider. Document Released: 11/06/2015 Document Revised: 06/29/2016 Document Reviewed: 08/11/2015 Elsevier Interactive Patient Education  2017 [place] Prevention in the Home Falls can cause injuries. They can happen to people of all ages. There are many things you can do to make your home safe and to help prevent falls. What can I do on the outside of my home?  Regularly fix the edges of walkways and driveways and fix any cracks.  Remove anything that might make you trip as you walk through a door, such as a raised step or threshold.  Trim any bushes or trees on the path to your home.  Use bright outdoor lighting.  Clear any walking paths of anything that might make someone trip, such as rocks or tools.  Regularly check to see if handrails are loose or broken. Make sure that both sides of any steps have handrails.  Any raised decks and porches should have guardrails on the edges.  Have any leaves, snow, or ice cleared regularly.  Use sand or salt on walking paths during winter.  Clean up any spills in your garage right away. This includes oil or grease spills. What can I do in the bathroom?  Use night lights.  Install grab bars by the toilet and in the tub and shower. Do not use towel bars as grab bars.  Use non-skid mats or decals in the tub or shower.  If you need to sit down in the shower, use a plastic, non-slip stool.  Keep the floor dry. Clean up any water that spills on the floor as soon as it happens.  Remove soap buildup in the tub or shower regularly.  Attach bath mats  securely with double-sided non-slip rug tape.  Do not have throw rugs and other things on the floor that can make you trip. What can I do in the bedroom?  Use night lights.  Make sure that you have a light by your bed that is easy to reach.  Do not use any sheets or blankets that are too big for your bed. They should not hang down onto the floor.  Have a firm chair that has side arms. You can use this for support while you get dressed.  Do not have throw rugs and other things on the floor that can make you trip. What can I do in the kitchen?  Clean up any spills right away.  Avoid walking on wet floors.  Keep items that you use a lot in easy-to-reach places.  If you need to reach something above you, use a strong step stool that has a grab bar.  Keep electrical cords out of the way.  Do not use floor polish or wax that makes floors slippery. If you must use wax, use non-skid floor wax.  Do not have throw rugs and other things on the floor that can make you trip.  What can I do with my stairs?  Do not leave any items on the stairs.  Make sure that there are handrails on both sides of the stairs and use them. Fix handrails that are broken or loose. Make sure that handrails are as long as the stairways.  Check any carpeting to make sure that it is firmly attached to the stairs. Fix any carpet that is loose or worn.  Avoid having throw rugs at the top or bottom of the stairs. If you do have throw rugs, attach them to the floor with carpet tape.  Make sure that you have a light switch at the top of the stairs and the bottom of the stairs. If you do not have them, ask someone to add them for you. What else can I do to help prevent falls?  Wear shoes that:  Do not have high heels.  Have rubber bottoms.  Are comfortable and fit you well.  Are closed at the toe. Do not wear sandals.  If you use a stepladder:  Make sure that it is fully opened. Do not climb a closed  stepladder.  Make sure that both sides of the stepladder are locked into place.  Ask someone to hold it for you, if possible.  Clearly mark and make sure that you can see:  Any grab bars or handrails.  First and last steps.  Where the edge of each step is.  Use tools that help you move around (mobility aids) if they are needed. These include:  Canes.  Walkers.  Scooters.  Crutches.  Turn on the lights when you go into a dark area. Replace any light bulbs as soon as they burn out.  Set up your furniture so you have a clear path. Avoid moving your furniture around.  If any of your floors are uneven, fix them.  If there are any pets around you, be aware of where they are.  Review your medicines with your doctor. Some medicines can make you feel dizzy. This can increase your chance of falling. Ask your doctor what other things that you can do to help prevent falls. This information is not intended to replace advice given to you by your health care provider. Make sure you discuss any questions you have with your health care provider. Document Released: 08/06/2009 Document Revised: 03/17/2016 Document Reviewed: 11/14/2014 Elsevier Interactive Patient Education  2017 [employer].

## 2018-03-08 NOTE — Progress Notes (Signed)
Patient: Luke Durham, Male    DOB: December 20, 1932, 84 y.o.   MRN: [id number] Visit Date: 03/08/2018  Today's Provider: Mar Daring, PA-C   Chief Complaint  Patient presents with  . Medicare Wellness   Subjective:     Complete Physical Luke Durham is a 84 y.o. male. He feels well. He reports exercising limited. He reports he is sleeping fairly well.  Had visit with NHA today at 1:30. Note reviewed.  -----------------------------------------------------------    Review of Systems  Constitutional: Positive for fatigue.  HENT: Positive for drooling, postnasal drip, rhinorrhea, trouble swallowing and voice change.   Eyes: Positive for photophobia.  Respiratory: Negative.   Cardiovascular: Negative.   Gastrointestinal: Positive for constipation.  Endocrine: Positive for polyuria.  Genitourinary: Positive for enuresis and urgency.  Musculoskeletal: Positive for gait problem.  Allergic/Immunologic: Negative.   Neurological: Positive for speech difficulty.  Hematological: Bruises/bleeds easily.  Psychiatric/Behavioral: Positive for decreased concentration and hallucinations. The patient is nervous/anxious.     Social History   Socioeconomic History  . Marital status: Married    Spouse name: Not on file  . Number of children: 2  . Years of education: Not on file  . Highest education level: Bachelor's degree (e.g., BA, AB, BS)  Occupational History  . Occupation: retired  Scientific laboratory technician  . Financial resource strain: Not hard at all  . Food insecurity:    Worry: Never true    Inability: Never true  . Transportation needs:    Medical: No    Non-medical: No  Tobacco Use  . Smoking status: Never Smoker  . Smokeless tobacco: Never Used  Substance and Sexual Activity  . Alcohol use: Yes    Alcohol/week: 0.0 - 4.2 oz  . Drug use: No  . Sexual activity: Not on file  Lifestyle  . Physical activity:    Days per week: Not on file    Minutes per session:  Not on file  . Stress: Rather much  Relationships  . Social connections:    Talks on phone: Not on file    Gets together: Not on file    Attends religious service: Not on file    Active member of club or organization: Not on file    Attends meetings of clubs or organizations: Not on file    Relationship status: Not on file  . Intimate partner violence:    Fear of current or ex partner: Not on file    Emotionally abused: Not on file    Physically abused: Not on file    Forced sexual activity: Not on file  Other Topics Concern  . Not on file  Social History Narrative  . Not on file    Past Medical History:  Diagnosis Date  . Anxiety   . Arthritis   . Asthma    childhood asthma  . Cancer ([place]) 7 years ago   lymphoma, [place] (recent)  . Chronic kidney disease   . Colon polyps   . GERD (gastroesophageal reflux disease)   . History of kidney stones   . Parkinson disease [facility])      Patient Active Problem List   Diagnosis Date Noted  . Dementia due to Parkinson's disease without behavioral disturbance ([place]) 07/18/2017  . Neurogenic orthostatic hypotension ([place]) 12/02/2016  . BPH with urinary obstruction 11/14/2016  . Urinary frequency 10/10/2016  . Microscopic hematuria 10/10/2016  . Parkinson's syndrome ([place]) 06/14/2016  . Arthritis 06/14/2016  . Depression 06/14/2016  .  Heartburn 06/14/2016  . Urinary incontinence 06/14/2016  . Skin lesion 06/14/2016  . Dementia 06/14/2016  . Bilateral edema of lower extremity 06/14/2016    Past Surgical History:  Procedure Laterality Date  . COLON SURGERY    . CYSTOSCOPY WITH LITHOLAPAXY N/A 11/14/2016   Procedure: CYSTOSCOPY WITH LITHOLAPAXY;  Surgeon: Hollice Espy, MD;  Location: [facility] ORS;  Service: Urology;  Laterality: N/A;  . EYE SURGERY Bilateral    Cataract Extraction with IOL  . HERNIA REPAIR  20 years  . MOHS SURGERY    . SMALL INTESTINE SURGERY     Per patient 7 years  . TRANSURETHRAL RESECTION OF PROSTATE N/A  11/14/2016   Procedure: TRANSURETHRAL RESECTION OF THE PROSTATE (TURP);  Surgeon: Hollice Espy, MD;  Location: [facility] ORS;  Service: Urology;  Laterality: N/A;    His family history includes Heart disease in his father. There is no history of Bladder Cancer, Prostate cancer, or Kidney cancer.      Current Outpatient Medications:  .  Amantadine HCl 100 MG tablet, Take 100 mg by mouth daily., Disp: , Rfl:  .  aspirin 81 MG tablet, Take 81 mg by mouth daily., Disp: , Rfl:  .  carbidopa-levodopa (SINEMET CR) 50-200 MG tablet, Take 1 tablet by mouth at bedtime. With the Aricept, Disp: , Rfl:  .  carbidopa-levodopa (SINEMET IR) 25-100 MG tablet, take 2 tablets by mouth four times a day as directed (Patient taking differently: Take 2 tablets by mouth 3 (three) times daily. ), Disp: 270 tablet, Rfl: 11 .  Carboxymethylcellulose Sodium (THERATEARS OP), Place 1-2 drops into both eyes at bedtime., Disp: , Rfl:  .  cholecalciferol (VITAMIN D) 1000 units tablet, Take 1,000 Units by mouth daily., Disp: , Rfl:  .  docusate sodium (COLACE) 100 MG capsule, Take 1 capsule (100 mg total) by mouth 2 (two) times daily., Disp: 60 capsule, Rfl: 11 .  donepezil (ARICEPT) 10 MG tablet, Take 10 mg by mouth at bedtime. , Disp: , Rfl:  .  econazole nitrate 1 % cream, Apply 1 application topically daily. Applied to feet, Disp: 85 g, Rfl: 0 .  furosemide (LASIX) 20 MG tablet, take 1 tablet by mouth once daily, Disp: 90 tablet, Rfl: 3 .  midodrine (PROAMATINE) 2.5 MG tablet, Take 2.5 mg by mouth 3 (three) times daily with meals. , Disp: , Rfl:  .  Multiple Vitamin (MULTIVITAMIN) capsule, Take 1 capsule by mouth daily., Disp: , Rfl:  .  Multiple Vitamins-Minerals (OCUVITE PO), Take 1 tablet by mouth daily. , Disp: , Rfl:  .  niacinamide 500 MG tablet, , Disp: , Rfl: 0 .  nitrofurantoin, macrocrystal-monohydrate, (MACROBID) 100 MG capsule, Take 1 capsule (100 mg total) by mouth every 12 (twelve) hours., Disp: 14 capsule, Rfl:  0 .  Pimavanserin Tartrate (NUPLAZID) 17 MG TABS, Take 17 mg by mouth daily. , Disp: , Rfl:  .  Pimavanserin Tartrate (NUPLAZID) 34 MG CAPS, Take 34 mg by mouth daily., Disp: , Rfl:  .  sertraline (ZOLOFT) 50 MG tablet, TAKE 1 TABLET BY MOUTH EVERY DAY, Disp: 90 tablet, Rfl: 1  Patient Care Team: Mar Daring, PA-C as PCP - General (Family Medicine) Vladimir Crofts, MD as Consulting Physician (Neurology) Leandrew Koyanagi, MD as Referring Physician (Ophthalmology) Poggi, Marshall Cork, MD as Consulting Physician (Surgery) Merril Abbe, MD as Referring Physician (Dermatology) Hollice Espy, MD as Consulting Physician (Urology) Dasher, Rayvon Char, MD as Consulting Physician (Dermatology) Lang Snow, NP as Nurse Practitioner (Neurology)  Objective:   Vitals: BP 102/62 (BP Location: Left Arm, Patient Position: Sitting, Cuff Size: Normal)   Pulse 65   Temp 98.6 F (37 C) (Oral)   Resp 16   Ht 5\' 7"  (1.702 m)   Wt 166 lb (75.3 kg)   BMI 26.00 kg/m   Physical Exam  Constitutional: He appears well-developed and well-nourished. No distress.  HENT:  Head: Normocephalic and atraumatic.  Neck: Normal range of motion. Neck supple. No JVD present. No tracheal deviation present. No thyromegaly present.  Cardiovascular: Normal rate, regular rhythm and normal heart sounds. Exam reveals no gallop and no friction rub.  No murmur heard. Pulmonary/Chest: Effort normal and breath sounds normal. No respiratory distress. He has no wheezes. He has no rales.  Abdominal: Soft. Bowel sounds are normal. There is no tenderness.  Musculoskeletal: He exhibits no edema.  Lymphadenopathy:    He has no cervical adenopathy.  Skin: Skin is warm. He is not diaphoretic.  Vitals reviewed.   Activities of Daily Living In your present state of health, do you have any difficulty performing the following activities: 03/08/2018  Hearing? N  Vision? N  Difficulty concentrating or making  decisions? Y  Walking or climbing stairs? Y  Comment Due to Parkinsons.   Dressing or bathing? Y  Comment Needs assistance due to lack of flexability  Doing errands, shopping? Y  Comment Does not drive.  Preparing Food and eating ? Y  Comment Wifes cooks and preps food.   Using the Toilet? N  In the past six months, have you accidently leaked urine? Y  Comment Occasionally, wears protection.   Do you have problems with loss of bowel control? N  Managing your Medications? Y  Managing your Finances? N  Housekeeping or managing your Housekeeping? N  Some recent data might be hidden    Fall Risk Assessment Fall Risk  03/08/2018 02/10/2017  Falls in the past year? Yes Yes  Number falls in past yr: 2 or more 2 or more  Injury with Fall? No No  Risk for fall due to : - Impaired mobility  Follow up Falls prevention discussed Falls prevention discussed     Depression Screen PHQ 2/9 Scores 03/08/2018 02/10/2017  PHQ - 2 Score 4 3  PHQ- 9 Score 14 16    Cognitive Testing - 6-CIT  Correct? Score   What year is it? yes 0 0 or 4  What month is it? yes 0 0 or 3  Memorize:    Pia Mau,  44,  [address],  [name],      What time is it? (within 1 hour) yes 0 0 or 3  Count backwards from 20 yes 0 0, 2, or 4  Name the months of the year no 4 0, 2, or 4  Repeat name & address above no 2 0, 2, 4, 6, 8, or 10       TOTAL SCORE  6/28   Interpretation:  Normal  Normal (0-7) Abnormal (8-28)       Assessment & Plan:    Annual Physical Reviewed patient's Family Medical History Reviewed and updated list of patient's medical providers Assessment of cognitive impairment was done Assessed patient's functional ability Established a written schedule for health screening [place] Completed and Reviewed  Exercise Activities and Dietary recommendations Goals    . DIET - INCREASE WATER INTAKE     Recommend increasing water intake to 4-6 glasses a day.  Immunization History  Administered Date(s) Administered  . Influenza-Unspecified 07/24/2016  . Pneumococcal Conjugate-13 12/16/2016  . Pneumococcal Polysaccharide-23 01/09/2018  . Zoster Recombinat (Shingrix) 07/18/2017, 01/29/2018    Health Maintenance  Topic Date Due  . TETANUS/TDAP  10/24/2026 (Originally 03/24/1952)  . INFLUENZA VACCINE  05/24/2018  . PNA vac Low Risk Adult  Completed     Discussed health benefits of physical activity, and encouraged him to engage in regular exercise appropriate for his age and condition.    1. Annual physical exam Normal exam today for patient.   2. Tinea pedis of both feet Stable. Diagnosis pulled for medication refill. Continue current medical treatment plan. Will check labs as below and f/u pending results. - econazole nitrate 1 % cream; Apply 1 application topically daily. Applied to feet  Dispense: 85 g; Refill: 0 - CBC w/Diff/Platelet - Comprehensive Metabolic Panel (CMET) - Lipid Profile  3. Neurogenic orthostatic hypotension ([place]) Followed by Neurology. Discussed good hydration. Will check labs as below and f/u pending results. - CBC w/Diff/Platelet - Comprehensive Metabolic Panel (CMET) - Lipid Profile  4. Parkinson's syndrome ([place]) Stable. Has PT for movement. Followed by Neurology. Will check labs as below and f/u pending results. - CBC w/Diff/Platelet - Comprehensive Metabolic Panel (CMET) - Lipid Profile  5. Dementia due to Parkinson's disease without behavioral disturbance (HCC) Seemingly stable. Followed by Neurology.  - CBC w/Diff/Platelet - Comprehensive Metabolic Panel (CMET) - Lipid Profile  ------------------------------------------------------------------------------------------------------------    Mar Daring, PA-C  [facility]

## 2018-03-08 NOTE — Progress Notes (Signed)
Subjective:   Luke Durham is a 84 y.o. male who presents for Medicare Annual/Subsequent preventive examination.  Review of Systems:  N/A  Cardiac Risk Factors include: advanced age (>25men, >58 women)     Objective:    Vitals: BP (!) 92/58 (BP Location: Right Arm)   Pulse 65   Temp 98.6 F (37 C) (Oral)   Ht 5\' 7"  (1.702 m)   Wt 166 lb 9.6 oz (75.6 kg)   BMI 26.09 kg/m   Body mass index is 26.09 kg/m.  Advanced Directives 03/08/2018 02/10/2017 11/14/2016 11/04/2016 10/12/2016 07/19/2016 06/14/2016  Does Patient Have a Medical Advance Directive? Yes Yes Yes Yes Yes Yes Yes  Type of Paramedic of [place];Living will [place];Living will Healthcare Power of [place];Living will [place];Living will [place];Living will [place];Living will  Does patient want to make changes to medical advance directive? - - No - Patient declined No - Patient declined Yes (MAU/Ambulatory/Procedural Areas - Information given) No - Patient declined -  Copy of [place] in Chart? No - copy requested No - copy requested No - copy requested No - copy requested No - copy requested No - copy requested -    Tobacco Social History   Tobacco Use  Smoking Status Never Smoker  Smokeless Tobacco Never Used     Counseling given: Not Answered   Clinical Intake:  Pre-visit preparation completed: Yes  Pain : No/denies pain Pain Score: 0-No pain     Nutritional Status: BMI 25 -29 Overweight Nutritional Risks: None Diabetes: No  How often do you need to have someone help you when you read instructions, pamphlets, or other written materials from your doctor or pharmacy?: 1 - Never  Interpreter Needed?: No  Information entered by :: [facility], LPN  Past Medical History:  Diagnosis Date  . Anxiety   . Arthritis   . Asthma    childhood asthma    . Cancer ([place]) 7 years ago   lymphoma, [place] (recent)  . Chronic kidney disease   . Colon polyps   . GERD (gastroesophageal reflux disease)   . History of kidney stones   . Parkinson disease [facility])    Past Surgical History:  Procedure Laterality Date  . COLON SURGERY    . CYSTOSCOPY WITH LITHOLAPAXY N/A 11/14/2016   Procedure: CYSTOSCOPY WITH LITHOLAPAXY;  Surgeon: Hollice Espy, MD;  Location: [facility] ORS;  Service: Urology;  Laterality: N/A;  . EYE SURGERY Bilateral    Cataract Extraction with IOL  . HERNIA REPAIR  20 years  . MOHS SURGERY    . SMALL INTESTINE SURGERY     Per patient 7 years  . TRANSURETHRAL RESECTION OF PROSTATE N/A 11/14/2016   Procedure: TRANSURETHRAL RESECTION OF THE PROSTATE (TURP);  Surgeon: Hollice Espy, MD;  Location: [facility] ORS;  Service: Urology;  Laterality: N/A;   Family History  Problem Relation Age of Onset  . Heart disease Father   . Bladder Cancer Neg Hx   . Prostate cancer Neg Hx   . Kidney cancer Neg Hx    Social History   Socioeconomic History  . Marital status: Married    Spouse name: Not on file  . Number of children: 2  . Years of education: Not on file  . Highest education level: Bachelor's degree (e.g., BA, AB, BS)  Occupational History  . Occupation: retired  Scientific laboratory technician  . Financial resource strain:  Not hard at all  . Food insecurity:    Worry: Never true    Inability: Never true  . Transportation needs:    Medical: No    Non-medical: No  Tobacco Use  . Smoking status: Never Smoker  . Smokeless tobacco: Never Used  Substance and Sexual Activity  . Alcohol use: Yes    Alcohol/week: 0.0 - 4.2 oz  . Drug use: No  . Sexual activity: Not on file  Lifestyle  . Physical activity:    Days per week: Not on file    Minutes per session: Not on file  . Stress: Rather much  Relationships  . Social connections:    Talks on phone: Not on file    Gets together: Not on file    Attends religious service: Not on file    Active  member of club or organization: Not on file    Attends meetings of clubs or organizations: Not on file    Relationship status: Not on file  Other Topics Concern  . Not on file  Social History Narrative  . Not on file    Outpatient Encounter Medications as of 03/08/2018  Medication Sig  . aspirin 81 MG tablet Take 81 mg by mouth daily.  . carbidopa-levodopa (SINEMET CR) 50-200 MG tablet Take 1 tablet by mouth at bedtime. With the Aricept  . carbidopa-levodopa (SINEMET IR) 25-100 MG tablet take 2 tablets by mouth four times a day as directed (Patient taking differently: Take 2 tablets by mouth 3 (three) times daily. )  . Carboxymethylcellulose Sodium (THERATEARS OP) Place 1-2 drops into both eyes at bedtime.  . cholecalciferol (VITAMIN D) 1000 units tablet Take 1,000 Units by mouth daily.  Marland Kitchen docusate sodium (COLACE) 100 MG capsule Take 1 capsule (100 mg total) by mouth 2 (two) times daily.  Marland Kitchen donepezil (ARICEPT) 10 MG tablet Take 10 mg by mouth at bedtime.   Marland Kitchen econazole nitrate 1 % cream Apply 1 application topically daily. Applied to feet  . midodrine (PROAMATINE) 2.5 MG tablet Take 2.5 mg by mouth 3 (three) times daily with meals.   . Multiple Vitamins-Minerals (OCUVITE PO) Take 1 tablet by mouth daily.   Debby Freiberg Tartrate (NUPLAZID) 34 MG CAPS Take 34 mg by mouth daily.  . sertraline (ZOLOFT) 50 MG tablet TAKE 1 TABLET BY MOUTH EVERY DAY  . Amantadine HCl 100 MG tablet Take 100 mg by mouth daily.  . furosemide (LASIX) 20 MG tablet take 1 tablet by mouth once daily (Patient not taking: Reported on 03/08/2018)  . Multiple Vitamin (MULTIVITAMIN) capsule Take 1 capsule by mouth daily.  . niacinamide 500 MG tablet   . nitrofurantoin, macrocrystal-monohydrate, (MACROBID) 100 MG capsule Take 1 capsule (100 mg total) by mouth every 12 (twelve) hours. (Patient not taking: Reported on 03/08/2018)  . Pimavanserin Tartrate (NUPLAZID) 17 MG TABS Take 17 mg by mouth daily.    No  facility-administered encounter medications on file as of 03/08/2018.     Activities of Daily Living In your present state of health, do you have any difficulty performing the following activities: 03/08/2018  Hearing? N  Vision? N  Difficulty concentrating or making decisions? Y  Walking or climbing stairs? Y  Comment Due to Parkinsons.   Dressing or bathing? Y  Comment Needs assistance due to lack of flexability  Doing errands, shopping? Y  Comment Does not drive.  Preparing Food and eating ? Y  Comment Wifes cooks and preps food.   Using the Toilet?  N  In the past six months, have you accidently leaked urine? Y  Comment Occasionally, wears protection.   Do you have problems with loss of bowel control? N  Managing your Medications? Y  Managing your Finances? N  Housekeeping or managing your Housekeeping? N  Some recent data might be hidden    Patient Care Team: Mar Daring, PA-C as PCP - General (Family Medicine) Vladimir Crofts, MD as Consulting Physician (Neurology) Leandrew Koyanagi, MD as Referring Physician (Ophthalmology) Poggi, Marshall Cork, MD as Consulting Physician (Surgery) Merril Abbe, MD as Referring Physician (Dermatology) Hollice Espy, MD as Consulting Physician (Urology) Dasher, Rayvon Char, MD as Consulting Physician (Dermatology) Lang Snow, NP as Nurse Practitioner (Neurology)   Assessment:   This is a routine wellness examination for [place].  Exercise Activities and Dietary recommendations Current Exercise Habits: Home exercise routine(physical therapy twice a week and at home PT exercises on the other days. ), Type of exercise: stretching;Other - see comments(balance and flexability exercises), Time (Minutes): 30, Frequency (Times/Week): 7, Weekly Exercise (Minutes/Week): 210, Intensity: Mild, Exercise limited by: Other - see comments(Parkinsons Disease)  Goals    . DIET - INCREASE WATER INTAKE     Recommend increasing water  intake to 4-6 glasses a day.        Fall Risk Fall Risk  03/08/2018 02/10/2017  Falls in the past year? Yes Yes  Number falls in past yr: 2 or more 2 or more  Injury with Fall? No No  Risk for fall due to : - Impaired mobility  Follow up Falls prevention discussed Falls prevention discussed   Is the patient's home free of loose throw rugs in walkways, pet beds, electrical cords, etc?   yes      Grab bars in the bathroom? yes      Handrails on the stairs?   yes      Adequate lighting?   yes  Timed Get Up and Go Performed: N/A  Depression Screen PHQ 2/9 Scores 03/08/2018 02/10/2017  PHQ - 2 Score 4 3  PHQ- 9 Score 14 16    Cognitive Function: Pt declined screening today.      6CIT Screen 02/10/2017  What Year? 0 points  What month? 0 points  What time? 0 points  Count back from 20 0 points  Months in reverse 4 points  Repeat phrase 2 points  Total Score 6    Immunization History  Administered Date(s) Administered  . Influenza-Unspecified 07/24/2016  . Pneumococcal Conjugate-13 12/16/2016  . Pneumococcal Polysaccharide-23 01/09/2018  . Zoster Recombinat (Shingrix) 07/18/2017, 01/29/2018    Qualifies for Shingles Vaccine? Up to date on new Shingrix.  Screening Tests Health Maintenance  Topic Date Due  . TETANUS/TDAP  10/24/2026 (Originally 03/24/1952)  . INFLUENZA VACCINE  05/24/2018  . PNA vac Low Risk Adult  Completed   Cancer Screenings: Lung: Low Dose CT Chest recommended if Age 16-80 years, 30 pack-year currently smoking OR have quit w/in 15years. Patient does not qualify. Colorectal: N/A  Additional Screenings:  Hepatitis C Screening: N/A      Plan:  I have personally reviewed and addressed the Medicare Annual Wellness questionnaire and have noted the following in the patient's chart:  A. Medical and social history B. Use of alcohol, tobacco or illicit drugs  C. Current medications and supplements D. Functional ability and status E.  Nutritional  status F.  Physical activity G. Advance directives H. List of other physicians I.  Hospitalizations, surgeries, and ER  visits in previous 12 months J.  [place] such as hearing and vision if needed, cognitive and depression L. Referrals and appointments - none  In addition, I have reviewed and discussed with patient certain preventive protocols, quality metrics, and best practice recommendations. A written personalized care plan for preventive services as well as general preventive health recommendations were provided to patient.  See attached scanned questionnaire for additional information.   Signed,  Fabio Neighbors, LPN Nurse Health Advisor   Nurse Recommendations: Pt declined the tetanus vaccine today.

## 2018-03-09 ENCOUNTER — Telehealth: Payer: Self-pay

## 2018-03-09 LAB — CBC WITH DIFFERENTIAL/PLATELET
Basophils Absolute: 0 10*3/uL (ref 0.0–0.2)
Basos: 0 %
EOS (ABSOLUTE): 0.1 10*3/uL (ref 0.0–0.4)
Eos: 2 %
Hematocrit: 38.1 % (ref 37.5–51.0)
Hemoglobin: 12.2 g/dL — ABNORMAL LOW (ref 13.0–17.7)
Immature Grans (Abs): 0 10*3/uL (ref 0.0–0.1)
Immature Granulocytes: 0 %
Lymphocytes Absolute: 1.3 10*3/uL (ref 0.7–3.1)
Lymphs: 26 %
MCH: 29.8 pg (ref 26.6–33.0)
MCHC: 32 g/dL (ref 31.5–35.7)
MCV: 93 fL (ref 79–97)
Monocytes Absolute: 0.5 10*3/uL (ref 0.1–0.9)
Monocytes: 9 %
Neutrophils Absolute: 3.1 10*3/uL (ref 1.4–7.0)
Neutrophils: 63 %
Platelets: 200 10*3/uL (ref 150–379)
RBC: 4.1 x10E6/uL — ABNORMAL LOW (ref 4.14–5.80)
RDW: 14.5 % (ref 12.3–15.4)
WBC: 4.9 10*3/uL (ref 3.4–10.8)

## 2018-03-09 LAB — LIPID PANEL
Chol/HDL Ratio: 3.7 ratio (ref 0.0–5.0)
Cholesterol, Total: 169 mg/dL (ref 100–199)
HDL: 46 mg/dL (ref >39)
LDL Calculated: 102 mg/dL — ABNORMAL HIGH (ref 0–99)
Triglycerides: 106 mg/dL (ref 0–149)
VLDL Cholesterol Cal: 21 mg/dL (ref 5–40)

## 2018-03-09 LAB — COMPREHENSIVE METABOLIC PANEL
ALT: 9 IU/L (ref 0–44)
AST: 18 IU/L (ref 0–40)
Albumin/Globulin Ratio: 1.9 (ref 1.2–2.2)
Albumin: 4.4 g/dL (ref 3.5–4.7)
Alkaline Phosphatase: 53 IU/L (ref 39–117)
BUN/Creatinine Ratio: 27 — ABNORMAL HIGH (ref 10–24)
BUN: 23 mg/dL (ref 8–27)
Bilirubin Total: 0.4 mg/dL (ref 0.0–1.2)
CO2: 24 mmol/L (ref 20–29)
Calcium: 8.9 mg/dL (ref 8.6–10.2)
Chloride: 101 mmol/L (ref 96–106)
Creatinine, Ser: 0.86 mg/dL (ref 0.76–1.27)
GFR calc Af Amer: 93 mL/min/{1.73_m2} (ref >59)
GFR calc non Af Amer: 81 mL/min/{1.73_m2} (ref >59)
Globulin, Total: 2.3 g/dL (ref 1.5–4.5)
Glucose: 86 mg/dL (ref 65–99)
Potassium: 4.2 mmol/L (ref 3.5–5.2)
Sodium: 141 mmol/L (ref 134–144)
Total Protein: 6.7 g/dL (ref 6.0–8.5)

## 2018-03-09 NOTE — Telephone Encounter (Signed)
-----   Message from Mar Daring, [place] sent at 03/09/2018 11:32 AM EDT ----- All labs are within normal limits and stable.  Thanks! -JB

## 2018-03-09 NOTE — Telephone Encounter (Signed)
Patient advised as below.  

## 2018-03-20 ENCOUNTER — Ambulatory Visit: Payer: Medicare Other | Admitting: Occupational Therapy

## 2018-03-20 DIAGNOSIS — M6281 Muscle weakness (generalized): Secondary | ICD-10-CM

## 2018-03-20 DIAGNOSIS — R2681 Unsteadiness on feet: Secondary | ICD-10-CM | POA: Diagnosis not present

## 2018-03-20 DIAGNOSIS — R293 Abnormal posture: Secondary | ICD-10-CM

## 2018-03-20 DIAGNOSIS — R278 Other lack of coordination: Secondary | ICD-10-CM

## 2018-03-20 DIAGNOSIS — R262 Difficulty in walking, not elsewhere classified: Secondary | ICD-10-CM

## 2018-03-23 ENCOUNTER — Ambulatory Visit: Payer: Medicare Other | Admitting: Occupational Therapy

## 2018-03-23 ENCOUNTER — Encounter: Payer: Self-pay | Admitting: Occupational Therapy

## 2018-03-23 DIAGNOSIS — R2681 Unsteadiness on feet: Secondary | ICD-10-CM | POA: Diagnosis not present

## 2018-03-23 DIAGNOSIS — R293 Abnormal posture: Secondary | ICD-10-CM

## 2018-03-23 DIAGNOSIS — R262 Difficulty in walking, not elsewhere classified: Secondary | ICD-10-CM

## 2018-03-23 DIAGNOSIS — R278 Other lack of coordination: Secondary | ICD-10-CM

## 2018-03-23 DIAGNOSIS — M6281 Muscle weakness (generalized): Secondary | ICD-10-CM

## 2018-03-23 NOTE — Therapy (Signed)
[place] MAIN [facility] SERVICES [address] [place], [place], [address] Phone: [phone]   Fax:  [phone]  Occupational Therapy Treatment  Patient Details  Name: Luke Durham MRN: [id number] Date of Birth: 1933/10/10 No data recorded  Encounter Date: 03/20/2018  OT End of Session - 03/23/18 1545    Visit Number  111    Number of Visits  132    Date for OT Re-Evaluation  05/02/18    OT Start Time  1400    OT Stop Time  1458    OT Time Calculation (min)  58 min    Activity Tolerance  Patient tolerated treatment well    Behavior During Therapy  [facility] for tasks assessed/performed       Past Medical History:  Diagnosis Date  . Anxiety   . Arthritis   . Asthma    childhood asthma  . Cancer ([place]) 7 years ago   lymphoma, [place] (recent)  . Chronic kidney disease   . Colon polyps   . GERD (gastroesophageal reflux disease)   . History of kidney stones   . Parkinson disease [facility])     Past Surgical History:  Procedure Laterality Date  . COLON SURGERY    . CYSTOSCOPY WITH LITHOLAPAXY N/A 11/14/2016   Procedure: CYSTOSCOPY WITH LITHOLAPAXY;  Surgeon: Hollice Espy, MD;  Location: [facility] ORS;  Service: Urology;  Laterality: N/A;  . EYE SURGERY Bilateral    Cataract Extraction with IOL  . HERNIA REPAIR  20 years  . MOHS SURGERY    . SMALL INTESTINE SURGERY     Per patient 7 years  . TRANSURETHRAL RESECTION OF PROSTATE N/A 11/14/2016   Procedure: TRANSURETHRAL RESECTION OF THE PROSTATE (TURP);  Surgeon: Hollice Espy, MD;  Location: [facility] ORS;  Service: Urology;  Laterality: N/A;    There were no vitals filed for this visit.  Subjective Assessment - 03/23/18 1542    Subjective   Pt reports he fell once during the last two weeks during his break from therapy. He reports his rollator got too far ahead, feet were frozen and he fell. He was able to get up without assist and denied any injury.     Pertinent History  Patient reports he was  diagnosed with Parkinson's disease about 10 plus years ago.  He reports a more recent decline in function in his daily activities in the last 6-12 months.  He has been seeing PT for the last couple months.    Patient Stated Goals  Patient reports he wants to be able to do more for himself and around the house.     Currently in Pain?  No/denies    Pain Score  0-No pain                   OT Treatments/Exercises (OP) - 03/23/18 1542      Neurological Re-education Exercises   Other Exercises 1  Pt reports he fell once during the last two weeks during his break from therapy. He reports his rollator got too far ahead, feet were frozen and he fell. He was able to get up without assist and denied any injury.     Other Exercises 2  Patient required cues to note the difference in exercises from stepping forwards to stepping to the side and had difficulty with realizing the difference in the two patterns.  Patient was seen for use of rollator for functional mobility skills and invoking freezing of gait to work on  responses of positioning of self and use of the rollator.  Patient  required CGA and extensive cues along with repetition of task to implement strategies.              OT Education - 03/23/18 1545    Education provided  Yes    Education Details  strategies for freezing of gait, positioning of self and use of rollator to decrease fall risk    Person(s) Educated  Patient;Spouse    Methods  Demonstration;Explanation;Verbal cues;Tactile cues    Comprehension  Verbal cues required;Returned demonstration;Verbalized understanding;Tactile cues required          OT Long Term Goals - 03/02/18 1039      OT LONG TERM GOAL #1   Title  Patient will demonstrate increase right hand grip to be able to cut food with modified independence.     Baseline  occasional assist required.    Time  12    Period  Weeks    Status  Partially Met      OT LONG TERM GOAL #2   Title  Patient will  complete donning long sleeve shirt with modified independence.    Baseline  requires assist with long sleeves, can perform short sleeves.    Time  12    Period  Weeks    Status  Achieved      OT LONG TERM GOAL #3   Title  Patient will improve coordination to manage buttons on clothing with occasional assistance only.    Baseline  requires assist with buttons each time    Time  12    Period  Weeks    Status  Partially Met      OT LONG TERM GOAL #4   Title  Patient will complete managing pants after toileting with modified independence.     Baseline  assist most days     Time  12    Period  Weeks    Status  Achieved      OT LONG TERM GOAL #5   Title  Patient will demonstrate turning behaviors with rollator using large steps and keeping feet behind the rollator with all turns with occasional minimal cues.     Time  12    Period  Weeks    Status  On-going      OT LONG TERM GOAL #6   Title  Patient will improve gait speed and endurance and be able to walk 950 feet in 6 minutes to negotiate around the home and community safely in 4 weeks    Baseline  860 at reassessment    Time  4    Period  Weeks    Status  Revised      OT LONG TERM GOAL #7   Title  Patient will complete HEP for maximal daily exercises with modified independence in 4 weeks    Time  12    Period  Weeks    Status  Partially Met      OT LONG TERM GOAL #8   Title  Patient will transfer from sit to stand without the use of arms safely and independently from a variety of chairs/surfaces in 4 weeks.     Baseline  difficulty from lower surfaces    Time  4    Period  Weeks    Status  Achieved      OT LONG TERM GOAL  #9   Baseline  Patient will demonstrate decreased episodes of freezing of behaviors  from score of 16 to score of less than 12.    Time  4    Period  Weeks    Status  Achieved            Plan - 03/23/18 1546    Clinical Impression Statement  Patient with a recent fall during a freezing of gait  episode without injury. When patient tends to freeze, he advances his rollator too far forwards and works to move his feet rather than pulling the rollator back for stability. Instructed this date and worked with focus on this area. Wife also instructed on positioning and how to give patient appropriate cues to correct to reduce risk of falls. Patient will need additional focus and repetition in this area to drive a change with motor learning and integrate responses with automaticity. Continue to work towards goals, diminish freezing of gait behaviors and reduce risk of falls with greater amplitude of gait and improved balance.     Occupational Profile and client history currently impacting functional performance  progressive disease process, freezing of gait behaviors, fall risk    Occupational performance deficits (Please refer to evaluation for details):  ADL's;IADL's;Leisure    Rehab Potential  Good    Current Impairments/barriers affecting progress:  positive: family support, negative: level of motivation, progressive disease    OT Frequency  2x / week    OT Duration  12 weeks    OT Treatment/Interventions  Self-care/ADL training;Moist Heat;DME and/or AE instruction;Balance training;Therapeutic activities;Therapeutic exercise;Neuromuscular education;Functional Mobility Training;Patient/family education    Consulted and Agree with Plan of Care  Patient    Family Member Consulted  wife       Patient will benefit from skilled therapeutic intervention in order to improve the following deficits and impairments:  Abnormal gait, Decreased coordination, Decreased range of motion, Difficulty walking, Decreased endurance, Decreased safety awareness, Decreased activity tolerance, Decreased balance, Impaired UE functional use, Pain, Decreased mobility, Decreased strength  Visit Diagnosis: Unsteadiness on feet  Abnormal posture  Difficulty in walking, not elsewhere classified  Other lack of  coordination  Muscle weakness (generalized)    Problem List Patient Active Problem List   Diagnosis Date Noted  . Dementia due to Parkinson's disease without behavioral disturbance ([place]) 07/18/2017  . Neurogenic orthostatic hypotension ([place]) 12/02/2016  . BPH with urinary obstruction 11/14/2016  . Urinary frequency 10/10/2016  . Microscopic hematuria 10/10/2016  . Parkinson's syndrome ([place]) 06/14/2016  . Arthritis 06/14/2016  . Depression 06/14/2016  . Heartburn 06/14/2016  . Urinary incontinence 06/14/2016  . Skin lesion 06/14/2016  . Dementia 06/14/2016  . Bilateral edema of lower extremity 06/14/2016   Achilles Dunk, OTR/L, CLT  Lovett,Amy 03/23/2018, 3:48 PM  [place] MAIN [facility] SERVICES [address] [place], [place], [address] Phone: [phone]   Fax:  [phone]  Name: Luke Durham MRN: [id number] Date of Birth: 10-09-1933

## 2018-03-23 NOTE — Therapy (Signed)
[place] MAIN [facility] SERVICES [address] [place], [place], [address] Phone: [phone]   Fax:  [phone]  Occupational Therapy Treatment  Patient Details  Name: Luke Durham MRN: [id number] Date of Birth: [birth year] No data recorded  Encounter Date: 03/23/2018  OT End of Session - 03/23/18 1704    Visit Number  112    Number of Visits  132    Date for OT Re-Evaluation  05/02/18    OT Start Time  1100    OT Stop Time  1155    OT Time Calculation (min)  55 min    Activity Tolerance  Patient tolerated treatment well    Behavior During Therapy  [facility] for tasks assessed/performed       Past Medical History:  Diagnosis Date  . Anxiety   . Arthritis   . Asthma    childhood asthma  . Cancer ([place]) 7 years ago   lymphoma, [place] (recent)  . Chronic kidney disease   . Colon polyps   . GERD (gastroesophageal reflux disease)   . History of kidney stones   . Parkinson disease [facility])     Past Surgical History:  Procedure Laterality Date  . COLON SURGERY    . CYSTOSCOPY WITH LITHOLAPAXY N/A 11/14/2016   Procedure: CYSTOSCOPY WITH LITHOLAPAXY;  Surgeon: Hollice Espy, MD;  Location: [facility] ORS;  Service: Urology;  Laterality: N/A;  . EYE SURGERY Bilateral    Cataract Extraction with IOL  . HERNIA REPAIR  20 years  . MOHS SURGERY    . SMALL INTESTINE SURGERY     Per patient 7 years  . TRANSURETHRAL RESECTION OF PROSTATE N/A 11/14/2016   Procedure: TRANSURETHRAL RESECTION OF THE PROSTATE (TURP);  Surgeon: Hollice Espy, MD;  Location: [facility] ORS;  Service: Urology;  Laterality: N/A;    There were no vitals filed for this visit.  Subjective Assessment - 03/23/18 1702    Subjective   Pt reports he worked to self correct positioning during freezing of gait at home when he recognized it occurring. No falls reported since last session. He reports his wife is having more issues with hormones and being hot all the time and it results in her having  a short temper and makes the patient anxious. She is following up with an endocrinologist this week     Pertinent History  Patient reports he was diagnosed with Parkinson's disease about 10 plus years ago.  He reports a more recent decline in function in his daily activities in the last 6-12 months.  He has been seeing PT for the last couple months.    Patient Stated Goals  Patient reports he wants to be able to do more for himself and around the house.     Currently in Pain?  No/denies    Pain Score  0-No pain    Multiple Pain Sites  No                   OT Treatments/Exercises (OP) - 03/23/18 1702      Neurological Re-education Exercises   Other Exercises 1  Patient seen for instruction of LSVT BIG exercises: LSVT Daily Session Maximal Daily Exercises: Sustained movements are designed to rescale the amplitude of movement output for generalization to daily functional activities. Performed as follows for 1 set of 10 repetitions each: Multi directional sustained movements- 1) Floor to ceiling, 2) Side to side. Multi directional Repetitive movements performed in standing and are designed to provide retraining  effort needed for sustained muscle activation in tasks Performed as follows: 3) Step and reach forward, 4) Step and Reach Backwards, 5) Step and reach sideways, 6) Rock and reach forward/backward, 7) Rock and reach sideways.  SBA for most exercises in standing except CGA for stepping backwards exercise, along with verbal cues for proper form and technique.   Patient continues to require assist for balance when performing exercises in a standard version.     Other Exercises 2  Patient seen for focus on use of rollator for functional mobility and managing freezing of gait episodes.              OT Education - 03/23/18 1704    Education provided  Yes    Education Details  freezing of gait, rollator use    Methods  Demonstration;Explanation;Verbal cues;Tactile cues     Comprehension  Verbal cues required;Returned demonstration;Verbalized understanding;Tactile cues required          OT Long Term Goals - 03/02/18 1039      OT LONG TERM GOAL #1   Title  Patient will demonstrate increase right hand grip to be able to cut food with modified independence.     Baseline  occasional assist required.    Time  12    Period  Weeks    Status  Partially Met      OT LONG TERM GOAL #2   Title  Patient will complete donning long sleeve shirt with modified independence.    Baseline  requires assist with long sleeves, can perform short sleeves.    Time  12    Period  Weeks    Status  Achieved      OT LONG TERM GOAL #3   Title  Patient will improve coordination to manage buttons on clothing with occasional assistance only.    Baseline  requires assist with buttons each time    Time  12    Period  Weeks    Status  Partially Met      OT LONG TERM GOAL #4   Title  Patient will complete managing pants after toileting with modified independence.     Baseline  assist most days     Time  12    Period  Weeks    Status  Achieved      OT LONG TERM GOAL #5   Title  Patient will demonstrate turning behaviors with rollator using large steps and keeping feet behind the rollator with all turns with occasional minimal cues.     Time  12    Period  Weeks    Status  On-going      OT LONG TERM GOAL #6   Title  Patient will improve gait speed and endurance and be able to walk 950 feet in 6 minutes to negotiate around the home and community safely in 4 weeks    Baseline  860 at reassessment    Time  4    Period  Weeks    Status  Revised      OT LONG TERM GOAL #7   Title  Patient will complete HEP for maximal daily exercises with modified independence in 4 weeks    Time  12    Period  Weeks    Status  Partially Met      OT LONG TERM GOAL #8   Title  Patient will transfer from sit to stand without the use of arms safely and independently from a variety of  chairs/surfaces in 4 weeks.     Baseline  difficulty from lower surfaces    Time  4    Period  Weeks    Status  Achieved      OT LONG TERM GOAL  #9   Baseline  Patient will demonstrate decreased episodes of freezing of behaviors from score of 16 to score of less than 12.    Time  4    Period  Weeks    Status  Achieved            Plan - 03/23/18 1704    Clinical Impression Statement  Patient with a recent fall during a freezing of gait episode without injury. When patient tends to freeze, he advances his rollator too far forwards and works to move his feet rather than pulling the rollator back for stability. Instructed this date and worked with focus on this area. Wife also instructed on positioning and how to give patient appropriate cues to correct to reduce risk of falls. Patient will need additional focus and repetition in this area to drive a change with motor learning and integrate responses with automaticity. Continue to work towards goals, diminish freezing of gait behaviors and reduce risk of falls with greater amplitude of gait and improved balance.     Occupational Profile and client history currently impacting functional performance  progressive disease process, freezing of gait behaviors, fall risk    Occupational performance deficits (Please refer to evaluation for details):  ADL's;IADL's;Leisure    Rehab Potential  Good    Current Impairments/barriers affecting progress:  positive: family support, negative: level of motivation, progressive disease    OT Frequency  2x / week    OT Duration  12 weeks    OT Treatment/Interventions  Self-care/ADL training;Moist Heat;DME and/or AE instruction;Balance training;Therapeutic activities;Therapeutic exercise;Neuromuscular education;Functional Mobility Training;Patient/family education    Consulted and Agree with Plan of Care  Patient    Family Member Consulted  wife       Patient will benefit from skilled therapeutic intervention  in order to improve the following deficits and impairments:  Abnormal gait, Decreased coordination, Decreased range of motion, Difficulty walking, Decreased endurance, Decreased safety awareness, Decreased activity tolerance, Decreased balance, Impaired UE functional use, Pain, Decreased mobility, Decreased strength  Visit Diagnosis: Unsteadiness on feet  Abnormal posture  Difficulty in walking, not elsewhere classified  Other lack of coordination  Muscle weakness (generalized)    Problem List Patient Active Problem List   Diagnosis Date Noted  . Dementia due to Parkinson's disease without behavioral disturbance ([place]) 07/18/2017  . Neurogenic orthostatic hypotension ([place]) 12/02/2016  . BPH with urinary obstruction 11/14/2016  . Urinary frequency 10/10/2016  . Microscopic hematuria 10/10/2016  . Parkinson's syndrome ([place]) 06/14/2016  . Arthritis 06/14/2016  . Depression 06/14/2016  . Heartburn 06/14/2016  . Urinary incontinence 06/14/2016  . Skin lesion 06/14/2016  . Dementia 06/14/2016  . Bilateral edema of lower extremity 06/14/2016   Achilles Dunk, OTR/L, CLT  Lovett,Amy 03/23/2018, 5:05 PM  [place] MAIN [facility] SERVICES [address] [place], [place], [address] Phone: [phone]   Fax:  [phone]  Name: Lyndle Pang MRN: [id number] Date of Birth: 08-Jun-1933

## 2018-03-26 ENCOUNTER — Ambulatory Visit: Payer: Medicare Other | Attending: Neurology | Admitting: Occupational Therapy

## 2018-03-26 DIAGNOSIS — R278 Other lack of coordination: Secondary | ICD-10-CM | POA: Diagnosis present

## 2018-03-26 DIAGNOSIS — M6281 Muscle weakness (generalized): Secondary | ICD-10-CM

## 2018-03-26 DIAGNOSIS — R293 Abnormal posture: Secondary | ICD-10-CM | POA: Diagnosis present

## 2018-03-26 DIAGNOSIS — R262 Difficulty in walking, not elsewhere classified: Secondary | ICD-10-CM | POA: Diagnosis present

## 2018-03-26 DIAGNOSIS — R2681 Unsteadiness on feet: Secondary | ICD-10-CM | POA: Insufficient documentation

## 2018-03-30 ENCOUNTER — Ambulatory Visit: Payer: Medicare Other | Admitting: Occupational Therapy

## 2018-03-30 DIAGNOSIS — R2681 Unsteadiness on feet: Secondary | ICD-10-CM | POA: Diagnosis not present

## 2018-03-30 DIAGNOSIS — M6281 Muscle weakness (generalized): Secondary | ICD-10-CM

## 2018-03-30 DIAGNOSIS — R293 Abnormal posture: Secondary | ICD-10-CM

## 2018-03-30 DIAGNOSIS — R278 Other lack of coordination: Secondary | ICD-10-CM

## 2018-03-30 DIAGNOSIS — R262 Difficulty in walking, not elsewhere classified: Secondary | ICD-10-CM

## 2018-03-31 ENCOUNTER — Encounter: Payer: Self-pay | Admitting: Occupational Therapy

## 2018-03-31 NOTE — Therapy (Signed)
[place] MAIN [facility] SERVICES [address] [place], [place], [address] Phone: [phone]   Fax:  [phone]  Occupational Therapy Treatment  Patient Details  Name: Luke Durham MRN: [id number] Date of Birth: [birth year] No data recorded  Encounter Date: 03/26/2018  OT End of Session - 03/31/18 0916    Visit Number  113    Number of Visits  132    Date for OT Re-Evaluation  05/02/18    OT Start Time  1101    OT Stop Time  1200    OT Time Calculation (min)  59 min    Activity Tolerance  Patient tolerated treatment well    Behavior During Therapy  [facility] for tasks assessed/performed       Past Medical History:  Diagnosis Date  . Anxiety   . Arthritis   . Asthma    childhood asthma  . Cancer ([place]) 7 years ago   lymphoma, [place] (recent)  . Chronic kidney disease   . Colon polyps   . GERD (gastroesophageal reflux disease)   . History of kidney stones   . Parkinson disease [facility])     Past Surgical History:  Procedure Laterality Date  . COLON SURGERY    . CYSTOSCOPY WITH LITHOLAPAXY N/A 11/14/2016   Procedure: CYSTOSCOPY WITH LITHOLAPAXY;  Surgeon: Hollice Espy, MD;  Location: [facility] ORS;  Service: Urology;  Laterality: N/A;  . EYE SURGERY Bilateral    Cataract Extraction with IOL  . HERNIA REPAIR  20 years  . MOHS SURGERY    . SMALL INTESTINE SURGERY     Per patient 7 years  . TRANSURETHRAL RESECTION OF PROSTATE N/A 11/14/2016   Procedure: TRANSURETHRAL RESECTION OF THE PROSTATE (TURP);  Surgeon: Hollice Espy, MD;  Location: [facility] ORS;  Service: Urology;  Laterality: N/A;    There were no vitals filed for this visit.  Subjective Assessment - 03/31/18 0908    Subjective   Patient reports he had a good weekend, it was his birthday so he celebrated with his family, went out to lunch and also had lots of desserts, he denies any falls this week and is requesting to work on posture this week.     Pertinent History  Patient reports he  was diagnosed with Parkinson's disease about 10 plus years ago.  He reports a more recent decline in function in his daily activities in the last 6-12 months.  He has been seeing PT for the last couple months.    Patient Stated Goals  Patient reports he wants to be able to do more for himself and around the house.     Currently in Pain?  No/denies    Pain Score  0-No pain                   OT Treatments/Exercises (OP) - 03/31/18 6834      ADLs   ADL Comments  Patient seen for simulated toilet transfers with focus on approaching toilet and stepping patterns to back up to surface with cues for amplitude of steps to move backwards. Crossed leg method to put on socks and shoes.       Neurological Re-education Exercises   Other Exercises 1  Patient seen for instruction of LSVT BIG exercises: LSVT Daily Session Maximal Daily Exercises: Sustained movements are designed to rescale the amplitude of movement output for generalization to daily functional activities. Performed as follows for 1 set of 10 repetitions each: Multi directional sustained movements- 1) Floor  to ceiling, 2) Side to side. Multi directional Repetitive movements performed in standing and are designed to provide retraining effort needed for sustained muscle activation in tasks Performed as follows: 3) Step and reach forward, 4) Step and Reach Backwards, 5) Step and reach sideways, 6) Rock and reach forward/backward, 7) Rock and reach sideways.  SBA for most exercises in standing except CGA for stepping backwards exercise, along with verbal cues for proper form and technique. Patient requires CGA to min assist for  balance with exercises in standing.  He also requires cues for posture during exercise.    Other Exercises 2  Posture exercises in standing next to wall, cues to stand tall, retract shoulder blades and head up, multiple trials.             OT Education - 03/31/18 [phone]    Education provided  Yes    Education  Details  freezing of gait, rollator use    Person(s) Educated  Patient;Spouse    Methods  Demonstration;Explanation;Verbal cues;Tactile cues    Comprehension  Verbal cues required;Returned demonstration;Verbalized understanding;Tactile cues required          OT Long Term Goals - 03/02/18 1039      OT LONG TERM GOAL #1   Title  Patient will demonstrate increase right hand grip to be able to cut food with modified independence.     Baseline  occasional assist required.    Time  12    Period  Weeks    Status  Partially Met      OT LONG TERM GOAL #2   Title  Patient will complete donning long sleeve shirt with modified independence.    Baseline  requires assist with long sleeves, can perform short sleeves.    Time  12    Period  Weeks    Status  Achieved      OT LONG TERM GOAL #3   Title  Patient will improve coordination to manage buttons on clothing with occasional assistance only.    Baseline  requires assist with buttons each time    Time  12    Period  Weeks    Status  Partially Met      OT LONG TERM GOAL #4   Title  Patient will complete managing pants after toileting with modified independence.     Baseline  assist most days     Time  12    Period  Weeks    Status  Achieved      OT LONG TERM GOAL #5   Title  Patient will demonstrate turning behaviors with rollator using large steps and keeping feet behind the rollator with all turns with occasional minimal cues.     Time  12    Period  Weeks    Status  On-going      OT LONG TERM GOAL #6   Title  Patient will improve gait speed and endurance and be able to walk 950 feet in 6 minutes to negotiate around the home and community safely in 4 weeks    Baseline  860 at reassessment    Time  4    Period  Weeks    Status  Revised      OT LONG TERM GOAL #7   Title  Patient will complete HEP for maximal daily exercises with modified independence in 4 weeks    Time  12    Period  Weeks    Status  Partially Met  OT  LONG TERM GOAL #8   Title  Patient will transfer from sit to stand without the use of arms safely and independently from a variety of chairs/surfaces in 4 weeks.     Baseline  difficulty from lower surfaces    Time  4    Period  Weeks    Status  Achieved      OT LONG TERM GOAL  #9   Baseline  Patient will demonstrate decreased episodes of freezing of behaviors from score of 16 to score of less than 12.    Time  4    Period  Weeks    Status  Achieved            Plan - 03/31/18 0916    Clinical Impression Statement  Patient requesting to work on posture this date, feels he is becoming more flexed with standing and sitting.  Discussed with patient how posture affects his balance and how he needs to continue to work towards posture on a daily basis at home, during exercises and while walking.  Patient responds well to cues verbally and tactile cues at his back.  Continues to work towards diminishing freezing of gait patterns and use of rollator.      Occupational Profile and client history currently impacting functional performance  progressive disease process, freezing of gait behaviors, fall risk    Occupational performance deficits (Please refer to evaluation for details):  ADL's;IADL's;Leisure    OT Frequency  2x / week    OT Duration  12 weeks    OT Treatment/Interventions  Self-care/ADL training;Moist Heat;DME and/or AE instruction;Balance training;Therapeutic activities;Therapeutic exercise;Neuromuscular education;Functional Mobility Training;Patient/family education    Consulted and Agree with Plan of Care  Patient       Patient will benefit from skilled therapeutic intervention in order to improve the following deficits and impairments:  Abnormal gait, Decreased coordination, Decreased range of motion, Difficulty walking, Decreased endurance, Decreased safety awareness, Decreased activity tolerance, Decreased balance, Impaired UE functional use, Pain, Decreased mobility, Decreased  strength  Visit Diagnosis: Unsteadiness on feet  Abnormal posture  Difficulty in walking, not elsewhere classified  Other lack of coordination  Muscle weakness (generalized)    Problem List Patient Active Problem List   Diagnosis Date Noted  . Dementia due to Parkinson's disease without behavioral disturbance ([place]) 07/18/2017  . Neurogenic orthostatic hypotension ([place]) 12/02/2016  . BPH with urinary obstruction 11/14/2016  . Urinary frequency 10/10/2016  . Microscopic hematuria 10/10/2016  . Parkinson's syndrome ([place]) 06/14/2016  . Arthritis 06/14/2016  . Depression 06/14/2016  . Heartburn 06/14/2016  . Urinary incontinence 06/14/2016  . Skin lesion 06/14/2016  . Dementia 06/14/2016  . Bilateral edema of lower extremity 06/14/2016   Achilles Dunk, OTR/L, CLT  Lovett,Amy 03/31/2018, 9:21 AM  [place] MAIN [facility] SERVICES [address] [place], [place], [address] Phone: [phone]   Fax:  [phone]  Name: Luke Durham MRN: [id number] Date of Birth: 07-Apr-1933

## 2018-03-31 NOTE — Therapy (Signed)
[place] MAIN [facility] SERVICES [address] [place], [place], [address] Phone: [phone]   Fax:  [phone]  Occupational Therapy Treatment  Patient Details  Name: Luke Durham MRN: [id number] Date of Birth: 05/19/1933 No data recorded  Encounter Date: 03/30/2018  OT End of Session - 03/31/18 1147    Visit Number  114    Number of Visits  132    Date for OT Re-Evaluation  05/02/18    OT Start Time  1100    OT Stop Time  1158    OT Time Calculation (min)  58 min    Activity Tolerance  Patient tolerated treatment well    Behavior During Therapy  [facility] for tasks assessed/performed       Past Medical History:  Diagnosis Date  . Anxiety   . Arthritis   . Asthma    childhood asthma  . Cancer ([place]) 7 years ago   lymphoma, [place] (recent)  . Chronic kidney disease   . Colon polyps   . GERD (gastroesophageal reflux disease)   . History of kidney stones   . Parkinson disease [facility])     Past Surgical History:  Procedure Laterality Date  . COLON SURGERY    . CYSTOSCOPY WITH LITHOLAPAXY N/A 11/14/2016   Procedure: CYSTOSCOPY WITH LITHOLAPAXY;  Surgeon: Hollice Espy, MD;  Location: [facility] ORS;  Service: Urology;  Laterality: N/A;  . EYE SURGERY Bilateral    Cataract Extraction with IOL  . HERNIA REPAIR  20 years  . MOHS SURGERY    . SMALL INTESTINE SURGERY     Per patient 7 years  . TRANSURETHRAL RESECTION OF PROSTATE N/A 11/14/2016   Procedure: TRANSURETHRAL RESECTION OF THE PROSTATE (TURP);  Surgeon: Hollice Espy, MD;  Location: [facility] ORS;  Service: Urology;  Laterality: N/A;    There were no vitals filed for this visit.  Subjective Assessment - 03/31/18 1143    Subjective   Patient reports he is tired today and that results in him moving really slow.  Increased freezing this date with initiation of movement. Patient reports he has recently had decreased pain in his right shoulder.     Pertinent History  Patient reports he was  diagnosed with Parkinson's disease about 10 plus years ago.  He reports a more recent decline in function in his daily activities in the last 6-12 months.  He has been seeing PT for the last couple months.    Patient Stated Goals  Patient reports he wants to be able to do more for himself and around the house.     Currently in Pain?  No/denies    Pain Score  0-No pain                   OT Treatments/Exercises (OP) - 03/31/18 1144      Neurological Re-education Exercises   Other Exercises 1  Patient seen for instruction of LSVT BIG exercises: LSVT Daily Session Maximal Daily Exercises: Sustained movements are designed to rescale the amplitude of movement output for generalization to daily functional activities. Performed as follows for 1 set of 10 repetitions each: Multi directional sustained movements- 1) Floor to ceiling, 2) Side to side. Multi directional Repetitive movements performed in standing and are designed to provide retraining effort needed for sustained muscle activation in tasks Performed as follows: 3) Step and reach forward, 4) Step and Reach Backwards, 5) Step and reach sideways, 6) Rock and reach forward/backward, 7) Rock and reach sideways.  SBA for most exercises in standing except CGA for stepping backwards exercise, along with verbal cues for proper form and technique. Patient requires CGA to min assist for  balance with exercises in standing.  He also requires cues for posture during exercise.    Other Exercises 2  Patient seen for initiation and termination of gait with cues to help diminish freezing patterns. Patient requires cues for positioning of rollator during times of difficulty.  Functional mobility for 500 feet for 2 trials with cues for turning behaviors and use of rollator.              OT Education - 03/31/18 1147    Education provided  Yes    Education Details  freezing of gait, rollator use    Person(s) Educated  Patient;Spouse    Methods   Demonstration;Explanation;Verbal cues;Tactile cues    Comprehension  Verbal cues required;Returned demonstration;Verbalized understanding;Tactile cues required          OT Long Term Goals - 03/02/18 1039      OT LONG TERM GOAL #1   Title  Patient will demonstrate increase right hand grip to be able to cut food with modified independence.     Baseline  occasional assist required.    Time  12    Period  Weeks    Status  Partially Met      OT LONG TERM GOAL #2   Title  Patient will complete donning long sleeve shirt with modified independence.    Baseline  requires assist with long sleeves, can perform short sleeves.    Time  12    Period  Weeks    Status  Achieved      OT LONG TERM GOAL #3   Title  Patient will improve coordination to manage buttons on clothing with occasional assistance only.    Baseline  requires assist with buttons each time    Time  12    Period  Weeks    Status  Partially Met      OT LONG TERM GOAL #4   Title  Patient will complete managing pants after toileting with modified independence.     Baseline  assist most days     Time  12    Period  Weeks    Status  Achieved      OT LONG TERM GOAL #5   Title  Patient will demonstrate turning behaviors with rollator using large steps and keeping feet behind the rollator with all turns with occasional minimal cues.     Time  12    Period  Weeks    Status  On-going      OT LONG TERM GOAL #6   Title  Patient will improve gait speed and endurance and be able to walk 950 feet in 6 minutes to negotiate around the home and community safely in 4 weeks    Baseline  860 at reassessment    Time  4    Period  Weeks    Status  Revised      OT LONG TERM GOAL #7   Title  Patient will complete HEP for maximal daily exercises with modified independence in 4 weeks    Time  12    Period  Weeks    Status  Partially Met      OT LONG TERM GOAL #8   Title  Patient will transfer from sit to stand without the use of arms  safely and independently from a  variety of chairs/surfaces in 4 weeks.     Baseline  difficulty from lower surfaces    Time  4    Period  Weeks    Status  Achieved      OT LONG TERM GOAL  #9   Baseline  Patient will demonstrate decreased episodes of freezing of behaviors from score of 16 to score of less than 12.    Time  4    Period  Weeks    Status  Achieved            Plan - 03/31/18 1148    Clinical Impression Statement  Patient fatigued this date and as a result he is demonstrating increased freezing of gait this date.  Continues to require cues for positioning of self in relation to rollator, encouraged patient to pull walker back to him rather than trying to advance his steps.  He tends to move onto toes and requires cues to shift weight backwards to heels before attempting to move forwards. Continue to work towards improving balance, gait and daily activities.     Occupational Profile and client history currently impacting functional performance  progressive disease process, freezing of gait behaviors, fall risk    Occupational performance deficits (Please refer to evaluation for details):  ADL's;IADL's;Leisure    Rehab Potential  Good    Current Impairments/barriers affecting progress:  positive: family support, negative: level of motivation, progressive disease    OT Frequency  2x / week    OT Duration  12 weeks    OT Treatment/Interventions  Self-care/ADL training;Moist Heat;DME and/or AE instruction;Balance training;Therapeutic activities;Therapeutic exercise;Neuromuscular education;Functional Mobility Training;Patient/family education    Consulted and Agree with Plan of Care  Patient       Patient will benefit from skilled therapeutic intervention in order to improve the following deficits and impairments:  Abnormal gait, Decreased coordination, Decreased range of motion, Difficulty walking, Decreased endurance, Decreased safety awareness, Decreased activity tolerance,  Decreased balance, Impaired UE functional use, Pain, Decreased mobility, Decreased strength  Visit Diagnosis: Unsteadiness on feet  Abnormal posture  Difficulty in walking, not elsewhere classified  Other lack of coordination  Muscle weakness (generalized)    Problem List Patient Active Problem List   Diagnosis Date Noted  . Dementia due to Parkinson's disease without behavioral disturbance ([place]) 07/18/2017  . Neurogenic orthostatic hypotension ([place]) 12/02/2016  . BPH with urinary obstruction 11/14/2016  . Urinary frequency 10/10/2016  . Microscopic hematuria 10/10/2016  . Parkinson's syndrome ([place]) 06/14/2016  . Arthritis 06/14/2016  . Depression 06/14/2016  . Heartburn 06/14/2016  . Urinary incontinence 06/14/2016  . Skin lesion 06/14/2016  . Dementia 06/14/2016  . Bilateral edema of lower extremity 06/14/2016   Achilles Dunk, OTR/L, CLT  Lovett,Amy 03/31/2018, 11:51 AM  [place] MAIN [facility] SERVICES [address] [place], [place], [address] Phone: [phone]   Fax:  [phone]  Name: Briley Sulton MRN: [id number] Date of Birth: Apr 30, 1933

## 2018-04-02 ENCOUNTER — Ambulatory Visit: Payer: Medicare Other | Admitting: Occupational Therapy

## 2018-04-02 DIAGNOSIS — R278 Other lack of coordination: Secondary | ICD-10-CM

## 2018-04-02 DIAGNOSIS — R2681 Unsteadiness on feet: Secondary | ICD-10-CM

## 2018-04-02 DIAGNOSIS — M6281 Muscle weakness (generalized): Secondary | ICD-10-CM

## 2018-04-02 DIAGNOSIS — R293 Abnormal posture: Secondary | ICD-10-CM

## 2018-04-02 DIAGNOSIS — R262 Difficulty in walking, not elsewhere classified: Secondary | ICD-10-CM

## 2018-04-05 ENCOUNTER — Ambulatory Visit: Payer: Medicare Other | Admitting: Occupational Therapy

## 2018-04-05 DIAGNOSIS — R262 Difficulty in walking, not elsewhere classified: Secondary | ICD-10-CM

## 2018-04-05 DIAGNOSIS — R293 Abnormal posture: Secondary | ICD-10-CM

## 2018-04-05 DIAGNOSIS — M6281 Muscle weakness (generalized): Secondary | ICD-10-CM

## 2018-04-05 DIAGNOSIS — R278 Other lack of coordination: Secondary | ICD-10-CM

## 2018-04-05 DIAGNOSIS — R2681 Unsteadiness on feet: Secondary | ICD-10-CM

## 2018-04-09 ENCOUNTER — Encounter: Payer: Self-pay | Admitting: Occupational Therapy

## 2018-04-09 ENCOUNTER — Ambulatory Visit: Payer: Medicare Other | Admitting: Occupational Therapy

## 2018-04-09 DIAGNOSIS — R2681 Unsteadiness on feet: Secondary | ICD-10-CM

## 2018-04-09 DIAGNOSIS — R262 Difficulty in walking, not elsewhere classified: Secondary | ICD-10-CM

## 2018-04-09 DIAGNOSIS — R278 Other lack of coordination: Secondary | ICD-10-CM

## 2018-04-09 DIAGNOSIS — M6281 Muscle weakness (generalized): Secondary | ICD-10-CM

## 2018-04-09 DIAGNOSIS — R293 Abnormal posture: Secondary | ICD-10-CM

## 2018-04-09 NOTE — Therapy (Signed)
[place] MAIN [facility] SERVICES [address] [place], [place], [address] Phone: [phone]   Fax:  [phone]  Occupational Therapy Treatment  Patient Details  Name: Luke Durham MRN: [id number] Date of Birth: 10-25-1932 No data recorded  Encounter Date: 04/02/2018  OT End of Session - 04/09/18 2013    Visit Number  [id number]    Number of Visits  132    Date for OT Re-Evaluation  05/02/18    OT Start Time  1101    OT Stop Time  1200    OT Time Calculation (min)  59 min    Activity Tolerance  Patient tolerated treatment well    Behavior During Therapy  [facility] for tasks assessed/performed       Past Medical History:  Diagnosis Date  . Anxiety   . Arthritis   . Asthma    childhood asthma  . Cancer ([place]) 7 years ago   lymphoma, [place] (recent)  . Chronic kidney disease   . Colon polyps   . GERD (gastroesophageal reflux disease)   . History of kidney stones   . Parkinson disease [facility])     Past Surgical History:  Procedure Laterality Date  . COLON SURGERY    . CYSTOSCOPY WITH LITHOLAPAXY N/A 11/14/2016   Procedure: CYSTOSCOPY WITH LITHOLAPAXY;  Surgeon: Hollice Espy, MD;  Location: [facility] ORS;  Service: Urology;  Laterality: N/A;  . EYE SURGERY Bilateral    Cataract Extraction with IOL  . HERNIA REPAIR  20 years  . MOHS SURGERY    . SMALL INTESTINE SURGERY     Per patient 7 years  . TRANSURETHRAL RESECTION OF PROSTATE N/A 11/14/2016   Procedure: TRANSURETHRAL RESECTION OF THE PROSTATE (TURP);  Surgeon: Hollice Espy, MD;  Location: [facility] ORS;  Service: Urology;  Laterality: N/A;    There were no vitals filed for this visit.  Subjective Assessment - 04/09/18 2006    Subjective   Patient reports he has been monitoring his blood pressure at home and running around 100/65.      Pertinent History  Patient reports he was diagnosed with Parkinson's disease about 10 plus years ago.  He reports a more recent decline in function in his daily  activities in the last 6-12 months.  He has been seeing PT for the last couple months.    Patient Stated Goals  Patient reports he wants to be able to do more for himself and around the house.     Currently in Pain?  No/denies    Pain Score  0-No pain                   OT Treatments/Exercises (OP) - 04/09/18 2007      ADLs   ADL Comments  Patient seen for lower body dressing with socks and shoes from seated position with crossed leg method including tying shoes, occasional cues required but is becoming more consistent with performance at home.       Neurological Re-education Exercises   Other Exercises 1  Patient seen for neuromuscular reeducation with emphasis on BIG movement patterns, calibration of movements with focus on turning, approaching surfaces, sit to stand without the use of arms.  Patient seen for functional mobility with rollator in crowded spaces with occasional cues for not stopping when area is crowded but to keep moving through the spaces and navigating around people and items.      Other Exercises 2  Patient seen for instruction of LSVT BIG  exercises: LSVT Daily Session Maximal Daily Exercises: Sustained movements are designed to rescale the amplitude of movement output for generalization to daily functional activities. Performed as follows for 1 set of 10 repetitions each: Multi directional sustained movements- 1) Floor to ceiling, 2) Side to side. Multi directional Repetitive movements performed in standing and are designed to provide retraining effort needed for sustained muscle activation in tasks Performed as follows: 3) Step and reach forward, 4) Step and Reach Backwards, 5) Step and reach sideways, 6) Rock and reach forward/backward, 7) Rock and reach sideways.  SBA for most exercises in standing except CGA for stepping backwards exercise, along with verbal cues for proper form and technique. Patient requires CGA to min assist for  balance with exercises in  standing.  Postural exercises in sitting and standing, manual stretches and tactile cues for erect posture and head position.               OT Education - 04/09/18 2012    Education provided  Yes    Education Details  posture during sitting, standing and during exercises.     Person(s) Educated  Patient    Methods  Demonstration;Explanation;Verbal cues;Tactile cues    Comprehension  Verbal cues required;Returned demonstration;Verbalized understanding;Tactile cues required          OT Long Term Goals - 03/02/18 1039      OT LONG TERM GOAL #1   Title  Patient will demonstrate increase right hand grip to be able to cut food with modified independence.     Baseline  occasional assist required.    Time  12    Period  Weeks    Status  Partially Met      OT LONG TERM GOAL #2   Title  Patient will complete donning long sleeve shirt with modified independence.    Baseline  requires assist with long sleeves, can perform short sleeves.    Time  12    Period  Weeks    Status  Achieved      OT LONG TERM GOAL #3   Title  Patient will improve coordination to manage buttons on clothing with occasional assistance only.    Baseline  requires assist with buttons each time    Time  12    Period  Weeks    Status  Partially Met      OT LONG TERM GOAL #4   Title  Patient will complete managing pants after toileting with modified independence.     Baseline  assist most days     Time  12    Period  Weeks    Status  Achieved      OT LONG TERM GOAL #5   Title  Patient will demonstrate turning behaviors with rollator using large steps and keeping feet behind the rollator with all turns with occasional minimal cues.     Time  12    Period  Weeks    Status  On-going      OT LONG TERM GOAL #6   Title  Patient will improve gait speed and endurance and be able to walk 950 feet in 6 minutes to negotiate around the home and community safely in 4 weeks    Baseline  860 at reassessment     Time  4    Period  Weeks    Status  Revised      OT LONG TERM GOAL #7   Title  Patient will complete HEP for maximal  daily exercises with modified independence in 4 weeks    Time  12    Period  Weeks    Status  Partially Met      OT LONG TERM GOAL #8   Title  Patient will transfer from sit to stand without the use of arms safely and independently from a variety of chairs/surfaces in 4 weeks.     Baseline  difficulty from lower surfaces    Time  4    Period  Weeks    Status  Achieved      OT LONG TERM GOAL  #9   Baseline  Patient will demonstrate decreased episodes of freezing of behaviors from score of 16 to score of less than 12.    Time  4    Period  Weeks    Status  Achieved            Plan - 04/09/18 2013    Clinical Impression Statement  Patient continuing to monitor blood pressure, this date in the clinic it was 92/55 at beginning of session and after some exercises , it was rechecked and 100/60.  It has tended to be in the lower ranges in the last couple weeks, patient denies any dizziness or related symptoms.  Patient continues to work towards calibration of movment patterns to produce larger amplitude of gait, turning behaviors without freezing and continues to require cues for positioning of self in relation to rollator with turns.  Continue to work towards goals in [place] to improve balance, ROM/strength and independence in daily tasks.    Occupational Profile and client history currently impacting functional performance  progressive disease process, freezing of gait behaviors, fall risk    Occupational performance deficits (Please refer to evaluation for details):  ADL's;IADL's;Leisure    Rehab Potential  Good    Current Impairments/barriers affecting progress:  positive: family support, negative: level of motivation, progressive disease    OT Frequency  2x / week    OT Duration  12 weeks    Consulted and Agree with Plan of Care  Patient       Patient will benefit  from skilled therapeutic intervention in order to improve the following deficits and impairments:  Abnormal gait, Decreased coordination, Decreased range of motion, Difficulty walking, Decreased endurance, Decreased safety awareness, Decreased activity tolerance, Decreased balance, Impaired UE functional use, Pain, Decreased mobility, Decreased strength  Visit Diagnosis: Unsteadiness on feet  Abnormal posture  Difficulty in walking, not elsewhere classified  Other lack of coordination  Muscle weakness (generalized)    Problem List Patient Active Problem List   Diagnosis Date Noted  . Dementia due to Parkinson's disease without behavioral disturbance ([place]) 07/18/2017  . Neurogenic orthostatic hypotension ([place]) 12/02/2016  . BPH with urinary obstruction 11/14/2016  . Urinary frequency 10/10/2016  . Microscopic hematuria 10/10/2016  . Parkinson's syndrome ([place]) 06/14/2016  . Arthritis 06/14/2016  . Depression 06/14/2016  . Heartburn 06/14/2016  . Urinary incontinence 06/14/2016  . Skin lesion 06/14/2016  . Dementia 06/14/2016  . Bilateral edema of lower extremity 06/14/2016   Achilles Dunk, OTR/L, CLT  Lovett,Amy 04/09/2018, 8:18 PM  [place] MAIN [facility] SERVICES [address] [place], [place], [address] Phone: [phone]   Fax:  [phone]  Name: Finlay Godbee MRN: [id number] Date of Birth: 1933-07-03

## 2018-04-10 ENCOUNTER — Encounter: Payer: Self-pay | Admitting: Occupational Therapy

## 2018-04-10 NOTE — Therapy (Signed)
[place] MAIN [facility] SERVICES [address] [place], [place], [address] Phone: [phone]   Fax:  [phone]  Occupational Therapy Treatment  Patient Details  Name: Luke Durham MRN: [id number] Date of Birth: [birth year] No data recorded  Encounter Date: 04/09/2018  OT End of Session - 04/10/18 2026    Visit Number  117    Number of Visits  132    Date for OT Re-Evaluation  05/02/18    OT Start Time  1101    OT Stop Time  1200    OT Time Calculation (min)  59 min    Activity Tolerance  Patient tolerated treatment well    Behavior During Therapy  [facility] for tasks assessed/performed       Past Medical History:  Diagnosis Date  . Anxiety   . Arthritis   . Asthma    childhood asthma  . Cancer ([place]) 7 years ago   lymphoma, [place] (recent)  . Chronic kidney disease   . Colon polyps   . GERD (gastroesophageal reflux disease)   . History of kidney stones   . Parkinson disease [facility])     Past Surgical History:  Procedure Laterality Date  . COLON SURGERY    . CYSTOSCOPY WITH LITHOLAPAXY N/A 11/14/2016   Procedure: CYSTOSCOPY WITH LITHOLAPAXY;  Surgeon: Hollice Espy, MD;  Location: [facility] ORS;  Service: Urology;  Laterality: N/A;  . EYE SURGERY Bilateral    Cataract Extraction with IOL  . HERNIA REPAIR  20 years  . MOHS SURGERY    . SMALL INTESTINE SURGERY     Per patient 7 years  . TRANSURETHRAL RESECTION OF PROSTATE N/A 11/14/2016   Procedure: TRANSURETHRAL RESECTION OF THE PROSTATE (TURP);  Surgeon: Hollice Espy, MD;  Location: [facility] ORS;  Service: Urology;  Laterality: N/A;    There were no vitals filed for this visit.  Subjective Assessment - 04/10/18 2022    Subjective   Patient's son came in today to see what exercises and progress patient has made.  He is in town with his family for fathers day and they are planning to go to [facility] tomorrow, have lunch and see a tennis center.    Pertinent History  Patient reports he was  diagnosed with Parkinson's disease about 10 plus years ago.  He reports a more recent decline in function in his daily activities in the last 6-12 months.  He has been seeing PT for the last couple months.    Patient Stated Goals  Patient reports he wants to be able to do more for himself and around the house.     Currently in Pain?  No/denies    Pain Score  0-No pain                   OT Treatments/Exercises (OP) - 04/10/18 2029      Neurological Re-education Exercises   Digit Adduction  c    Other Exercises 1  Manual techniques for posture to provide prolonged stretch to chest and pectoralis area while providing cues for scapular retraction and head position.  Patient seen for strategies for functional mobility with rollator for tomorrows trip to [facility] and tennis center with his son.  Educated on signs of freezing and how to correct and provide cues.  Educated on appropriate cues for taking larger amplitude of steps and turning behaviors.    Other Exercises 2  Patient seen for instruction of LSVT BIG exercises: LSVT Daily Session Maximal  Daily Exercises: Sustained movements are designed to rescale the amplitude of movement output for generalization to daily functional activities. Performed as follows for 1 set of 10 repetitions each: Multi directional sustained movements- 1) Floor to ceiling, 2) Side to side. Multi directional Repetitive movements performed in standing and are designed to provide retraining effort needed for sustained muscle activation in tasks Performed as follows: 3) Step and reach forward, 4) Step and Reach Backwards, 5) Step and reach sideways, 6) Rock and reach forward/backward, 7) Rock and reach sideways.  SBA for most exercises in standing except CGA for stepping backwards exercise, along with verbal cues for proper form and technique. Patient requires CGA to min assist for  balance with exercises in standing.  Postural exercises in sitting and standing,  manual stretches and tactile cues for erect posture and head position.  Son present this date to observe and be familiar with patients home exercises and the types of assist and cues the patient needs.              OT Education - 04/10/18 2025    Education provided  Yes    Education Details  strategies to decrease freezing of gait,  turns with rollator, navigating in crowded and unfamiliar places.     Person(s) Educated  Patient    Methods  Demonstration;Explanation;Verbal cues;Tactile cues    Comprehension  Verbal cues required;Returned demonstration;Verbalized understanding;Tactile cues required          OT Long Term Goals - 03/02/18 1039      OT LONG TERM GOAL #1   Title  Patient will demonstrate increase right hand grip to be able to cut food with modified independence.     Baseline  occasional assist required.    Time  12    Period  Weeks    Status  Partially Met      OT LONG TERM GOAL #2   Title  Patient will complete donning long sleeve shirt with modified independence.    Baseline  requires assist with long sleeves, can perform short sleeves.    Time  12    Period  Weeks    Status  Achieved      OT LONG TERM GOAL #3   Title  Patient will improve coordination to manage buttons on clothing with occasional assistance only.    Baseline  requires assist with buttons each time    Time  12    Period  Weeks    Status  Partially Met      OT LONG TERM GOAL #4   Title  Patient will complete managing pants after toileting with modified independence.     Baseline  assist most days     Time  12    Period  Weeks    Status  Achieved      OT LONG TERM GOAL #5   Title  Patient will demonstrate turning behaviors with rollator using large steps and keeping feet behind the rollator with all turns with occasional minimal cues.     Time  12    Period  Weeks    Status  On-going      OT LONG TERM GOAL #6   Title  Patient will improve gait speed and endurance and be able to  walk 950 feet in 6 minutes to negotiate around the home and community safely in 4 weeks    Baseline  860 at reassessment    Time  4    Period  Weeks    Status  Revised      OT LONG TERM GOAL #7   Title  Patient will complete HEP for maximal daily exercises with modified independence in 4 weeks    Time  12    Period  Weeks    Status  Partially Met      OT LONG TERM GOAL #8   Title  Patient will transfer from sit to stand without the use of arms safely and independently from a variety of chairs/surfaces in 4 weeks.     Baseline  difficulty from lower surfaces    Time  4    Period  Weeks    Status  Achieved      OT LONG TERM GOAL  #9   Baseline  Patient will demonstrate decreased episodes of freezing of behaviors from score of 16 to score of less than 12.    Time  4    Period  Weeks    Status  Achieved            Plan - 04/10/18 2028    Clinical Impression Statement  Patient's son was in town and present during session today and was pleased with patient's progress and wanted to learn how to help his Dad at home and when going out into the community.  He is planning to take a day trip tomorrow to [facility], lunch and to visit a tennis center .  He was educated on functional mobility with rollator, signs of freezing of gait and how to correct, cues for amplitude of gait and frequent times, situations or places that often trigger freezing of gait.  Patient continues to progress in all areas.      Occupational Profile and client history currently impacting functional performance  progressive disease process, freezing of gait behaviors, fall risk    Occupational performance deficits (Please refer to evaluation for details):  ADL's;IADL's;Leisure    Rehab Potential  Good    Current Impairments/barriers affecting progress:  positive: family support, negative: level of motivation, progressive disease    OT Frequency  2x / week    OT Duration  12 weeks    OT Treatment/Interventions   Self-care/ADL training;Moist Heat;DME and/or AE instruction;Balance training;Therapeutic activities;Therapeutic exercise;Neuromuscular education;Functional Mobility Training;Patient/family education    Consulted and Agree with Plan of Care  Patient    Family Member Consulted  son       Patient will benefit from skilled therapeutic intervention in order to improve the following deficits and impairments:  Abnormal gait, Decreased coordination, Decreased range of motion, Difficulty walking, Decreased endurance, Decreased safety awareness, Decreased activity tolerance, Decreased balance, Impaired UE functional use, Pain, Decreased mobility, Decreased strength  Visit Diagnosis: Unsteadiness on feet  Abnormal posture  Difficulty in walking, not elsewhere classified  Other lack of coordination  Muscle weakness (generalized)    Problem List Patient Active Problem List   Diagnosis Date Noted  . Dementia due to Parkinson's disease without behavioral disturbance ([place]) 07/18/2017  . Neurogenic orthostatic hypotension ([place]) 12/02/2016  . BPH with urinary obstruction 11/14/2016  . Urinary frequency 10/10/2016  . Microscopic hematuria 10/10/2016  . Parkinson's syndrome ([place]) 06/14/2016  . Arthritis 06/14/2016  . Depression 06/14/2016  . Heartburn 06/14/2016  . Urinary incontinence 06/14/2016  . Skin lesion 06/14/2016  . Dementia 06/14/2016  . Bilateral edema of lower extremity 06/14/2016   Amy T Lovett, OTR/L, CLT  Lovett,Amy 04/10/2018, 8:41 PM  [place] [facility] SERVICES [address]  [place], [place], [address] Phone: [phone]   Fax:  [phone]  Name: Luke Durham MRN: [id number] Date of Birth: [birth year]

## 2018-04-10 NOTE — Therapy (Signed)
[place] MAIN [facility] SERVICES [address] [place], [place], [address] Phone: [phone]   Fax:  [phone]  Occupational Therapy Treatment  Patient Details  Name: Luke Durham MRN: [id number] Date of Birth: [birth year] No data recorded  Encounter Date: 04/05/2018  OT End of Session - 04/09/18 2132    Visit Number  116    Number of Visits  132    Date for OT Re-Evaluation  05/02/18    OT Start Time  1102    OT Stop Time  1200    OT Time Calculation (min)  58 min    Activity Tolerance  Patient tolerated treatment well    Behavior During Therapy  [facility] for tasks assessed/performed       Past Medical History:  Diagnosis Date  . Anxiety   . Arthritis   . Asthma    childhood asthma  . Cancer ([place]) 7 years ago   lymphoma, [place] (recent)  . Chronic kidney disease   . Colon polyps   . GERD (gastroesophageal reflux disease)   . History of kidney stones   . Parkinson disease [facility])     Past Surgical History:  Procedure Laterality Date  . COLON SURGERY    . CYSTOSCOPY WITH LITHOLAPAXY N/A 11/14/2016   Procedure: CYSTOSCOPY WITH LITHOLAPAXY;  Surgeon: Hollice Espy, MD;  Location: [facility] ORS;  Service: Urology;  Laterality: N/A;  . EYE SURGERY Bilateral    Cataract Extraction with IOL  . HERNIA REPAIR  20 years  . MOHS SURGERY    . SMALL INTESTINE SURGERY     Per patient 7 years  . TRANSURETHRAL RESECTION OF PROSTATE N/A 11/14/2016   Procedure: TRANSURETHRAL RESECTION OF THE PROSTATE (TURP);  Surgeon: Hollice Espy, MD;  Location: [facility] ORS;  Service: Urology;  Laterality: N/A;    There were no vitals filed for this visit.  Subjective Assessment - 04/09/18 2130    Subjective   Patient reports his family will be visiting this weekend to celebrate Fathers Day.  He is looking forwards to seeing his son and his son's family.  Granddaughter has a lot of allergies to they have to be careful where they eat.     Pertinent History  Patient  reports he was diagnosed with Parkinson's disease about 10 plus years ago.  He reports a more recent decline in function in his daily activities in the last 6-12 months.  He has been seeing PT for the last couple months.    Patient Stated Goals  Patient reports he wants to be able to do more for himself and around the house.     Currently in Pain?  No/denies    Pain Score  0-No pain                   OT Treatments/Exercises (OP) - 04/10/18 1950      Neurological Re-education Exercises   Other Exercises 1  Functional mobility with emphasis on turns in large and small spaces, cues for positioning of self, taking big steps. Ball exercises in standing with SBA and cues.     Other Exercises 2  Patient seen for instruction of LSVT BIG exercises: LSVT Daily Session Maximal Daily Exercises: Sustained movements are designed to rescale the amplitude of movement output for generalization to daily functional activities. Performed as follows for 1 set of 10 repetitions each: Multi directional sustained movements- 1) Floor to ceiling, 2) Side to side. Multi directional Repetitive movements performed in standing and  are designed to provide retraining effort needed for sustained muscle activation in tasks Performed as follows: 3) Step and reach forward, 4) Step and Reach Backwards, 5) Step and reach sideways, 6) Rock and reach forward/backward, 7) Rock and reach sideways.  SBA for most exercises in standing except CGA for stepping backwards exercise, along with verbal cues for proper form and technique. Patient requires CGA to min assist for  balance with exercises in standing.  Postural exercises in sitting and standing, manual stretches and tactile cues for erect posture and head position.               OT Education - 04/09/18 2131    Education provided  Yes    Education Details  posture, rollator use, positioning, turns    Person(s) Educated  Patient    Methods   Demonstration;Explanation;Verbal cues;Tactile cues    Comprehension  Verbal cues required;Returned demonstration;Verbalized understanding;Tactile cues required          OT Long Term Goals - 03/02/18 1039      OT LONG TERM GOAL #1   Title  Patient will demonstrate increase right hand grip to be able to cut food with modified independence.     Baseline  occasional assist required.    Time  12    Period  Weeks    Status  Partially Met      OT LONG TERM GOAL #2   Title  Patient will complete donning long sleeve shirt with modified independence.    Baseline  requires assist with long sleeves, can perform short sleeves.    Time  12    Period  Weeks    Status  Achieved      OT LONG TERM GOAL #3   Title  Patient will improve coordination to manage buttons on clothing with occasional assistance only.    Baseline  requires assist with buttons each time    Time  12    Period  Weeks    Status  Partially Met      OT LONG TERM GOAL #4   Title  Patient will complete managing pants after toileting with modified independence.     Baseline  assist most days     Time  12    Period  Weeks    Status  Achieved      OT LONG TERM GOAL #5   Title  Patient will demonstrate turning behaviors with rollator using large steps and keeping feet behind the rollator with all turns with occasional minimal cues.     Time  12    Period  Weeks    Status  On-going      OT LONG TERM GOAL #6   Title  Patient will improve gait speed and endurance and be able to walk 950 feet in 6 minutes to negotiate around the home and community safely in 4 weeks    Baseline  860 at reassessment    Time  4    Period  Weeks    Status  Revised      OT LONG TERM GOAL #7   Title  Patient will complete HEP for maximal daily exercises with modified independence in 4 weeks    Time  12    Period  Weeks    Status  Partially Met      OT LONG TERM GOAL #8   Title  Patient will transfer from sit to stand without the use of arms  safely and independently from  a variety of chairs/surfaces in 4 weeks.     Baseline  difficulty from lower surfaces    Time  4    Period  Weeks    Status  Achieved      OT LONG TERM GOAL  #9   Baseline  Patient will demonstrate decreased episodes of freezing of behaviors from score of 16 to score of less than 12.    Time  4    Period  Weeks    Status  Achieved            Plan - 04/09/18 2132    Clinical Impression Statement  Patient reports he has episodes of freezing more at home and has not had many episodes of freezing in the clinic.  Wife reports patient has more freezing at home but it is more when he is not using his assistive device.  We discussed he needs to keep his rollator close and use it at all times, even if he is going a short distance to the bathroom.  He demonstrates understanding however, we have had this conversation before and will continue to reinforce this concept.  Patient continues to progress with functional mobility and particularly with turns.  He still requires cues for big stepping patterns to stay behind the rollator but he is becoming more consistent with performance of this.  He does better when he has a larger area to turn but we have implemented trials of turning in small spaces.  Continue to work towards goals.     Occupational Profile and client history currently impacting functional performance  progressive disease process, freezing of gait behaviors, fall risk    Occupational performance deficits (Please refer to evaluation for details):  ADL's;IADL's;Leisure    Rehab Potential  Good    Current Impairments/barriers affecting progress:  positive: family support, negative: level of motivation, progressive disease    OT Frequency  2x / week    OT Duration  12 weeks    OT Treatment/Interventions  Self-care/ADL training;Moist Heat;DME and/or AE instruction;Balance training;Therapeutic activities;Therapeutic exercise;Neuromuscular education;Functional  Mobility Training;Patient/family education    Consulted and Agree with Plan of Care  Patient       Patient will benefit from skilled therapeutic intervention in order to improve the following deficits and impairments:  Abnormal gait, Decreased coordination, Decreased range of motion, Difficulty walking, Decreased endurance, Decreased safety awareness, Decreased activity tolerance, Decreased balance, Impaired UE functional use, Pain, Decreased mobility, Decreased strength  Visit Diagnosis: Unsteadiness on feet  Abnormal posture  Difficulty in walking, not elsewhere classified  Other lack of coordination  Muscle weakness (generalized)    Problem List Patient Active Problem List   Diagnosis Date Noted  . Dementia due to Parkinson's disease without behavioral disturbance ([place]) 07/18/2017  . Neurogenic orthostatic hypotension ([place]) 12/02/2016  . BPH with urinary obstruction 11/14/2016  . Urinary frequency 10/10/2016  . Microscopic hematuria 10/10/2016  . Parkinson's syndrome ([place]) 06/14/2016  . Arthritis 06/14/2016  . Depression 06/14/2016  . Heartburn 06/14/2016  . Urinary incontinence 06/14/2016  . Skin lesion 06/14/2016  . Dementia 06/14/2016  . Bilateral edema of lower extremity 06/14/2016   Achilles Dunk, OTR/L, CLT  Lovett,Amy 04/10/2018, 7:52 PM  [place] MAIN [facility] SERVICES [address] [place], [place], [address] Phone: [phone]   Fax:  [phone]  Name: Luke Durham MRN: [id number] Date of Birth: 1933/06/05

## 2018-04-11 ENCOUNTER — Encounter: Payer: Medicare Other | Admitting: Occupational Therapy

## 2018-04-12 ENCOUNTER — Ambulatory Visit: Payer: Medicare Other | Admitting: Occupational Therapy

## 2018-04-12 DIAGNOSIS — R2681 Unsteadiness on feet: Secondary | ICD-10-CM | POA: Diagnosis not present

## 2018-04-12 DIAGNOSIS — R278 Other lack of coordination: Secondary | ICD-10-CM

## 2018-04-12 DIAGNOSIS — M6281 Muscle weakness (generalized): Secondary | ICD-10-CM

## 2018-04-12 DIAGNOSIS — R262 Difficulty in walking, not elsewhere classified: Secondary | ICD-10-CM

## 2018-04-12 DIAGNOSIS — R293 Abnormal posture: Secondary | ICD-10-CM

## 2018-04-14 ENCOUNTER — Encounter: Payer: Self-pay | Admitting: Occupational Therapy

## 2018-04-14 NOTE — Therapy (Signed)
[place] [facility] [address] [place], [address] Phone: [phone]   Fax:  [phone]  Occupational Therapy Treatment  Patient Details  Name: Luke Durham MRN: [id number] Date of Birth: [birth year] No data recorded  Encounter Date: 04/12/2018  OT End of Session - 04/14/18 0917    Visit Number  118    Number of Visits  132    Date for OT Re-Evaluation  05/02/18    OT Start Time  1055    OT Stop Time  1158    OT Time Calculation (min)  63 min    Activity Tolerance  Patient tolerated treatment well    Behavior During Therapy  [facility] for tasks assessed/performed       Past Medical History:  Diagnosis Date  . Anxiety   . Arthritis   . Asthma    childhood asthma  . Cancer ([place]) 7 years ago   lymphoma, [place] (recent)  . Chronic kidney disease   . Colon polyps   . GERD (gastroesophageal reflux disease)   . History of kidney stones   . Parkinson disease [facility])     Past Surgical History:  Procedure Laterality Date  . COLON SURGERY    . CYSTOSCOPY WITH LITHOLAPAXY N/A 11/14/2016   Procedure: CYSTOSCOPY WITH LITHOLAPAXY;  Surgeon: Hollice Espy, MD;  Location: [facility] ORS;  Service: Urology;  Laterality: N/A;  . EYE SURGERY Bilateral    Cataract Extraction with IOL  . HERNIA REPAIR  20 years  . MOHS SURGERY    . SMALL INTESTINE SURGERY     Per patient 7 years  . TRANSURETHRAL RESECTION OF PROSTATE N/A 11/14/2016   Procedure: TRANSURETHRAL RESECTION OF THE PROSTATE (TURP);  Surgeon: Hollice Espy, MD;  Location: [facility] ORS;  Service: Urology;  Laterality: N/A;    There were no vitals filed for this visit.  Subjective Assessment - 04/14/18 0911    Subjective   Patient reports he did excellent with the day trip with his family to visit [place], wife was pleased and amazed at how well the patient performed.  Son was with patient all day and was able to give him cues as directed from last session.     Pertinent History  Patient reports he was diagnosed with Parkinson's disease about 10 plus years ago.  He reports a more recent decline in function in his daily activities in the last 6-12 months.  He has been seeing PT for the last couple months.    Patient Stated Goals  Patient reports he wants to be able to do more for himself and around the house.     Currently in Pain?  No/denies    Pain Score  0-No pain                   OT Treatments/Exercises (OP) - 04/14/18 0913      Neurological Re-education Exercises   Other Exercises 1  Patient seen this date for balance tasks with reciprocal stepping patterns with use of footstool, no arm/hand support and CGA from therapist.  Posture exercises on the wall and added ball for shoulder flexion with cues to retract scapulas, head up and opening up chest when raising ball for 2 sets of 10 reps.  Patient seen for functional mobility tasks, this date in an unfamiliar environment with change to offsite clinic this date, patient focusing on turning in small spaces, backing up in and out of doorways with cues and CGA  to SBA.  No freezing of gait noted this session.    Other Exercises 2  Patient seen for instruction of LSVT BIG exercises: LSVT Daily Session Maximal Daily Exercises: Sustained movements are designed to rescale the amplitude of movement output for generalization to daily functional activities. Performed as follows for 1 set of 10 repetitions each: Multi directional sustained movements- 1) Floor to ceiling, 2) Side to side. Multi directional Repetitive movements performed in standing and are designed to provide retraining effort needed for sustained muscle activation in tasks Performed as follows: 3) Step and reach forward, 4) Step and Reach Backwards, 5) Step and reach sideways, 6) Rock and reach forward/backward, 7) Rock and reach sideways.  SBA for most exercises in standing except CGA for stepping backwards exercise, along with verbal cues  for proper form and technique. Patient requires CGA to min assist for  balance with exercises in standing.  Postural exercises in sitting and standing, manual stretches and tactile cues for erect posture and head position.             OT Education - 04/14/18 [phone]    Education provided  Yes    Education Details  turning in small spaces, navigating in unfamiliar environment with barriers in place    Person(s) Educated  Patient    Methods  Demonstration;Explanation;Verbal cues;Tactile cues    Comprehension  Verbal cues required;Returned demonstration;Verbalized understanding;Tactile cues required          OT Long Term Goals - 03/02/18 1039      OT LONG TERM GOAL #1   Title  Patient will demonstrate increase right hand grip to be able to cut food with modified independence.     Baseline  occasional assist required.    Time  12    Period  Weeks    Status  Partially Met      OT LONG TERM GOAL #2   Title  Patient will complete donning long sleeve shirt with modified independence.    Baseline  requires assist with long sleeves, can perform short sleeves.    Time  12    Period  Weeks    Status  Achieved      OT LONG TERM GOAL #3   Title  Patient will improve coordination to manage buttons on clothing with occasional assistance only.    Baseline  requires assist with buttons each time    Time  12    Period  Weeks    Status  Partially Met      OT LONG TERM GOAL #4   Title  Patient will complete managing pants after toileting with modified independence.     Baseline  assist most days     Time  12    Period  Weeks    Status  Achieved      OT LONG TERM GOAL #5   Title  Patient will demonstrate turning behaviors with rollator using large steps and keeping feet behind the rollator with all turns with occasional minimal cues.     Time  12    Period  Weeks    Status  On-going      OT LONG TERM GOAL #6   Title  Patient will improve gait speed and endurance and be able to walk  950 feet in 6 minutes to negotiate around the home and community safely in 4 weeks    Baseline  860 at reassessment    Time  4    Period  Weeks  Status  Revised      OT LONG TERM GOAL #7   Title  Patient will complete HEP for maximal daily exercises with modified independence in 4 weeks    Time  12    Period  Weeks    Status  Partially Met      OT LONG TERM GOAL #8   Title  Patient will transfer from sit to stand without the use of arms safely and independently from a variety of chairs/surfaces in 4 weeks.     Baseline  difficulty from lower surfaces    Time  4    Period  Weeks    Status  Achieved      OT LONG TERM GOAL  #9   Baseline  Patient will demonstrate decreased episodes of freezing of behaviors from score of 16 to score of less than 12.    Time  4    Period  Weeks    Status  Achieved            Plan - 04/14/18 0918    Clinical Impression Statement  Patient's day trip with family this week was very successful and all were very pleased that the patient could spend the entire day on the go in unfamiliar places with no issues, no significant freezing episodes and no near falls even with ambulating on gravel surface with rollator.  He was challenaged with longer distance walking, his son was present and was able to provide cues directed from our last session.  Patient was extremely tired the next day and needed a day of rest and recovery but he feels great today.  He was seen in a different location this date in one of our offsite clinics and did well in an unfamiliar environment with no freezing episodes.  He has continued to make progress and appears to be integrating BIG principles into daily routine. Continue to work  towards cailbration of movements to facilitate change and progress with balance, gait and safety.     Occupational Profile and client history currently impacting functional performance  progressive disease process, freezing of gait behaviors, fall risk     Occupational performance deficits (Please refer to evaluation for details):  ADL's;IADL's;Leisure    Rehab Potential  Good    Current Impairments/barriers affecting progress:  positive: family support, negative: level of motivation, progressive disease    OT Frequency  2x / week    OT Duration  12 weeks    OT Treatment/Interventions  Self-care/ADL training;Moist Heat;DME and/or AE instruction;Balance training;Therapeutic activities;Therapeutic exercise;Neuromuscular education;Functional Mobility Training;Patient/family education    Consulted and Agree with Plan of Care  Patient       Patient will benefit from skilled therapeutic intervention in order to improve the following deficits and impairments:  Abnormal gait, Decreased coordination, Decreased range of motion, Difficulty walking, Decreased endurance, Decreased safety awareness, Decreased activity tolerance, Decreased balance, Impaired UE functional use, Pain, Decreased mobility, Decreased strength  Visit Diagnosis: Unsteadiness on feet  Abnormal posture  Difficulty in walking, not elsewhere classified  Other lack of coordination  Muscle weakness (generalized)    Problem List Patient Active Problem List   Diagnosis Date Noted  . Dementia due to Parkinson's disease without behavioral disturbance ([place]) 07/18/2017  . Neurogenic orthostatic hypotension ([place]) 12/02/2016  . BPH with urinary obstruction 11/14/2016  . Urinary frequency 10/10/2016  . Microscopic hematuria 10/10/2016  . Parkinson's syndrome ([place]) 06/14/2016  . Arthritis 06/14/2016  . Depression 06/14/2016  . Heartburn 06/14/2016  .  Urinary incontinence 06/14/2016  . Skin lesion 06/14/2016  . Dementia 06/14/2016  . Bilateral edema of lower extremity 06/14/2016   Amy T Lovett, OTR/L, CLT  Lovett,Amy 04/14/2018, 9:24 AM  [place] [facility] [address], [place], [address] Phone: [phone]    Fax:  [phone]  Name: Allin Frix MRN: [id number] Date of Birth: July 21, 1933

## 2018-04-17 ENCOUNTER — Ambulatory Visit: Payer: Medicare Other | Admitting: Occupational Therapy

## 2018-04-20 ENCOUNTER — Ambulatory Visit: Payer: Medicare Other | Admitting: Occupational Therapy

## 2018-04-20 DIAGNOSIS — R262 Difficulty in walking, not elsewhere classified: Secondary | ICD-10-CM

## 2018-04-20 DIAGNOSIS — R293 Abnormal posture: Secondary | ICD-10-CM

## 2018-04-20 DIAGNOSIS — R278 Other lack of coordination: Secondary | ICD-10-CM

## 2018-04-20 DIAGNOSIS — R2681 Unsteadiness on feet: Secondary | ICD-10-CM

## 2018-04-20 DIAGNOSIS — M6281 Muscle weakness (generalized): Secondary | ICD-10-CM

## 2018-04-20 NOTE — Therapy (Signed)
[place] [facility] [address], [place], [address] Phone: [phone]   Fax:  [phone]  Occupational Therapy Treatment  Patient Details  Name: Luke Durham MRN: [id number] Date of Birth: 1933/02/06 No data recorded  Encounter Date: 04/20/2018  OT End of Session - 04/20/18 1129    Visit Number  119    Number of Visits  132    Date for OT Re-Evaluation  05/02/18    OT Start Time  1055    OT Stop Time  1154    OT Time Calculation (min)  59 min    Activity Tolerance  Patient tolerated treatment well    Behavior During Therapy  [facility] for tasks assessed/performed       Past Medical History:  Diagnosis Date  . Anxiety   . Arthritis   . Asthma    childhood asthma  . Cancer ([place]) 7 years ago   lymphoma, [place] (recent)  . Chronic kidney disease   . Colon polyps   . GERD (gastroesophageal reflux disease)   . History of kidney stones   . Parkinson disease [facility])     Past Surgical History:  Procedure Laterality Date  . COLON SURGERY    . CYSTOSCOPY WITH LITHOLAPAXY N/A 11/14/2016   Procedure: CYSTOSCOPY WITH LITHOLAPAXY;  Surgeon: Hollice Espy, MD;  Location: [facility] ORS;  Service: Urology;  Laterality: N/A;  . EYE SURGERY Bilateral    Cataract Extraction with IOL  . HERNIA REPAIR  20 years  . MOHS SURGERY    . SMALL INTESTINE SURGERY     Per patient 7 years  . TRANSURETHRAL RESECTION OF PROSTATE N/A 11/14/2016   Procedure: TRANSURETHRAL RESECTION OF THE PROSTATE (TURP);  Surgeon: Hollice Espy, MD;  Location: [facility] ORS;  Service: Urology;  Laterality: N/A;    There were no vitals filed for this visit.  Subjective Assessment - 04/20/18 1620    Subjective   Wife reports patient is moving really slow today.  Patient reports he will be going to the doctor soon an wants to ask a few questions regarding medication.    Pertinent History  Patient reports he was diagnosed with Parkinson's disease about 10 plus years  ago.  He reports a more recent decline in function in his daily activities in the last 6-12 months.  He has been seeing PT for the last couple months.    Patient Stated Goals  Patient reports he wants to be able to do more for himself and around the house.     Currently in Pain?  No/denies    Pain Score  0-No pain                   OT Treatments/Exercises (OP) - 04/20/18 1621      ADLs   ADL Comments  Patient seen for functional mobility in small spaces such as the bathroom and closet and use of assistive device.  Patient has difficulty at times with turning with use of rollator and maintaining positioning behind rollator.  Multiple trials completed this date in unfamiliar environment with CGA to SBA and verbal cues.        Neurological Re-education Exercises   Other Exercises 1  Posture exercises on the wall and added ball for shoulder flexion with cues to retract scapulas, head up and opening up chest when raising ball for 2 sets of 10 reps.  Functional mobility outdoors with rollator on narrow path with CGA and cues.  Mobility up and down ramp with SBA.    Other Exercises 2  Patient seen for instruction of LSVT BIG exercises: LSVT Daily Session Maximal Daily Exercises: Sustained movements are designed to rescale the amplitude of movement output for generalization to daily functional activities. Performed as follows for 1 set of 10 repetitions each: Multi directional sustained movements- 1) Floor to ceiling, 2) Side to side. Multi directional Repetitive movements performed in standing and are designed to provide retraining effort needed for sustained muscle activation in tasks Performed as follows: 3) Step and reach forward, 4) Step and Reach Backwards, 5) Step and reach sideways, 6) Rock and reach forward/backward, 7) Rock and reach sideways.  SBA for most exercises in standing except CGA for stepping backwards exercise, along with verbal cues for proper form and technique. Patient  requires CGA to min assist for  balance with exercises in standing.  Postural exercises in sitting and standing, manual stretches and tactile cues for erect posture and head position.             OT Education - 04/20/18 1625    Education provided  Yes    Education Details  turning in small spaces, navigating in unfamiliar environment with barriers in place, posture    Methods  Demonstration;Explanation;Verbal cues;Tactile cues    Comprehension  Verbal cues required;Returned demonstration;Verbalized understanding;Tactile cues required          OT Long Term Goals - 03/02/18 1039      OT LONG TERM GOAL #1   Title  Patient will demonstrate increase right hand grip to be able to cut food with modified independence.     Baseline  occasional assist required.    Time  12    Period  Weeks    Status  Partially Met      OT LONG TERM GOAL #2   Title  Patient will complete donning long sleeve shirt with modified independence.    Baseline  requires assist with long sleeves, can perform short sleeves.    Time  12    Period  Weeks    Status  Achieved      OT LONG TERM GOAL #3   Title  Patient will improve coordination to manage buttons on clothing with occasional assistance only.    Baseline  requires assist with buttons each time    Time  12    Period  Weeks    Status  Partially Met      OT LONG TERM GOAL #4   Title  Patient will complete managing pants after toileting with modified independence.     Baseline  assist most days     Time  12    Period  Weeks    Status  Achieved      OT LONG TERM GOAL #5   Title  Patient will demonstrate turning behaviors with rollator using large steps and keeping feet behind the rollator with all turns with occasional minimal cues.     Time  12    Period  Weeks    Status  On-going      OT LONG TERM GOAL #6   Title  Patient will improve gait speed and endurance and be able to walk 950 feet in 6 minutes to negotiate around the home and  community safely in 4 weeks    Baseline  860 at reassessment    Time  4    Period  Weeks    Status  Revised  OT LONG TERM GOAL #7   Title  Patient will complete HEP for maximal daily exercises with modified independence in 4 weeks    Time  12    Period  Weeks    Status  Partially Met      OT LONG TERM GOAL #8   Title  Patient will transfer from sit to stand without the use of arms safely and independently from a variety of chairs/surfaces in 4 weeks.     Baseline  difficulty from lower surfaces    Time  4    Period  Weeks    Status  Achieved      OT LONG TERM GOAL  #9   Baseline  Patient will demonstrate decreased episodes of freezing of behaviors from score of 16 to score of less than 12.    Time  4    Period  Weeks    Status  Achieved            Plan - 04/20/18 1129    Clinical Impression Statement  Patient seen for the last 2 sessions at offsite clinic which is unfamiliar to patient, mild freezing of gait today which may be due to change in environment.  He requires cues for turning and use of rollator but is becoming more consistent with this and is starting to implement pulling walker back to him during times of freezing and when the rollator gets too far away.  He was slow to move this date and BP was 95/61 and then 102/66.  Patient continues to progress in all areas and will work towards goals to help decrease risk of falls, improve balance, functional mobility and increased independence in daily tasks.     Occupational Profile and client history currently impacting functional performance  progressive disease process, freezing of gait behaviors, fall risk    Current Impairments/barriers affecting progress:  positive: family support, negative: level of motivation, progressive disease    OT Frequency  2x / week    OT Duration  12 weeks    OT Treatment/Interventions  Self-care/ADL training;Moist Heat;DME and/or AE instruction;Balance training;Therapeutic  activities;Therapeutic exercise;Neuromuscular education;Functional Mobility Training;Patient/family education    Consulted and Agree with Plan of Care  Patient       Patient will benefit from skilled therapeutic intervention in order to improve the following deficits and impairments:  Abnormal gait, Decreased coordination, Decreased range of motion, Difficulty walking, Decreased endurance, Decreased safety awareness, Decreased activity tolerance, Decreased balance, Impaired UE functional use, Pain, Decreased mobility, Decreased strength  Visit Diagnosis: Unsteadiness on feet  Abnormal posture  Difficulty in walking, not elsewhere classified  Other lack of coordination  Muscle weakness (generalized)    Problem List Patient Active Problem List   Diagnosis Date Noted  . Dementia due to Parkinson's disease without behavioral disturbance ([place]) 07/18/2017  . Neurogenic orthostatic hypotension ([place]) 12/02/2016  . BPH with urinary obstruction 11/14/2016  . Urinary frequency 10/10/2016  . Microscopic hematuria 10/10/2016  . Parkinson's syndrome ([place]) 06/14/2016  . Arthritis 06/14/2016  . Depression 06/14/2016  . Heartburn 06/14/2016  . Urinary incontinence 06/14/2016  . Skin lesion 06/14/2016  . Dementia 06/14/2016  . Bilateral edema of lower extremity 06/14/2016   Diva Lemberger T Daxx Tiggs, OTR/L, CLT  Jeshurun [place]aca 04/20/2018, 4:30 PM  [place] [facility] [address], [place], [address] Phone: [phone]   Fax:  [phone]  Name: Jadrien Narine MRN: [id number] Date of Birth: 1933-08-25

## 2018-04-24 ENCOUNTER — Ambulatory Visit: Payer: Medicare Other | Attending: Physician Assistant | Admitting: Occupational Therapy

## 2018-04-24 VITALS — BP 107/54

## 2018-04-24 DIAGNOSIS — R262 Difficulty in walking, not elsewhere classified: Secondary | ICD-10-CM | POA: Diagnosis present

## 2018-04-24 DIAGNOSIS — M6281 Muscle weakness (generalized): Secondary | ICD-10-CM | POA: Insufficient documentation

## 2018-04-24 DIAGNOSIS — R2681 Unsteadiness on feet: Secondary | ICD-10-CM | POA: Diagnosis not present

## 2018-04-24 DIAGNOSIS — R293 Abnormal posture: Secondary | ICD-10-CM | POA: Diagnosis present

## 2018-04-24 DIAGNOSIS — R278 Other lack of coordination: Secondary | ICD-10-CM

## 2018-04-25 NOTE — Therapy (Signed)
[place] MAIN [facility] SERVICES [address] [place], [place], [address] Phone: [phone]   Fax:  [phone]  Occupational Therapy Treatment/Progress Update Reporting period from 03/01/2018 to 04/24/2018   Patient Details  Name: Luke Durham MRN: [id number] Date of Birth: November 20, 1932 No data recorded  Encounter Date: 04/24/2018  OT End of Session - 04/25/18 1037    Visit Number  120    Number of Visits  132    Date for OT Re-Evaluation  05/02/18    OT Start Time  1100    OT Stop Time  1156    OT Time Calculation (min)  56 min    Activity Tolerance  Patient tolerated treatment well    Behavior During Therapy  [facility] for tasks assessed/performed       Past Medical History:  Diagnosis Date  . Anxiety   . Arthritis   . Asthma    childhood asthma  . Cancer ([place]) 7 years ago   lymphoma, [place] (recent)  . Chronic kidney disease   . Colon polyps   . GERD (gastroesophageal reflux disease)   . History of kidney stones   . Parkinson disease [facility])     Past Surgical History:  Procedure Laterality Date  . COLON SURGERY    . CYSTOSCOPY WITH LITHOLAPAXY N/A 11/14/2016   Procedure: CYSTOSCOPY WITH LITHOLAPAXY;  Surgeon: Hollice Espy, MD;  Location: [facility] ORS;  Service: Urology;  Laterality: N/A;  . EYE SURGERY Bilateral    Cataract Extraction with IOL  . HERNIA REPAIR  20 years  . MOHS SURGERY    . SMALL INTESTINE SURGERY     Per patient 7 years  . TRANSURETHRAL RESECTION OF PROSTATE N/A 11/14/2016   Procedure: TRANSURETHRAL RESECTION OF THE PROSTATE (TURP);  Surgeon: Hollice Espy, MD;  Location: [facility] ORS;  Service: Urology;  Laterality: N/A;    Vitals:   04/24/18 1109  BP: (!) 107/54    Subjective Assessment - 04/25/18 1031    Subjective   Patient reports he has been in a brain fog this week, not sure why.  He states he has found himself not focused and worried about things that may or may not be issues (kids, finances, health, etc)   His wife reports patient has been slow moving this week     Pertinent History  Patient reports he was diagnosed with Parkinson's disease about 10 plus years ago.  He reports a more recent decline in function in his daily activities in the last 6-12 months.  He has been seeing PT for the last couple months.    Patient Stated Goals  Patient reports he wants to be able to do more for himself and around the house.     Currently in Pain?  No/denies    Pain Score  0-No pain                   OT Treatments/Exercises (OP) - 04/25/18 1033      Neurological Re-education Exercises   Other Exercises 1  Reciprocal stepping patterns on regular step with cues , SBA and use of one handrail, posture exercises in sitting with manual techniques from therapist for pec stretch, cues to retract shoulders, keep head up.  Posture exercises on the wall with a 30 sec hold with cues and SBA.  Short distance functional mobility with the use of the rollator in the crowded cafeteria area and serving line, cues for amplitude of steps and navigation in smaller spaces.  Crossed leg method with 10 sec stretch to reach to shoes for tying.     Other Exercises 2  Patient seen for instruction of LSVT BIG exercises: LSVT Daily Session Maximal Daily Exercises: Sustained movements are designed to rescale the amplitude of movement output for generalization to daily functional activities. Performed as follows for 1 set of 10 repetitions each: Multi directional sustained movements- 1) Floor to ceiling, 2) Side to side. Multi directional Repetitive movements performed in standing and are designed to provide retraining effort needed for sustained muscle activation in tasks Performed as follows: 3) Step and reach forward, 4) Step and Reach Backwards, 5) Step and reach sideways, 6) Rock and reach forward/backward, 7) Rock and reach sideways.  SBA for most exercises in standing except CGA for stepping backwards exercise, along with verbal  cues for proper form and technique.             OT Education - 04/25/18 1037    Education provided  Yes    Education Details  cues for amplitude of steps, positioning of self with rollator    Person(s) Educated  Patient    Methods  Demonstration;Explanation;Verbal cues;Tactile cues    Comprehension  Verbal cues required;Returned demonstration;Verbalized understanding;Tactile cues required          OT Long Term Goals - 04/25/18 1038      OT LONG TERM GOAL #1   Title  Patient will demonstrate increase right hand grip to be able to cut food with modified independence.     Baseline  occasional assist required.    Time  12    Period  Weeks    Status  Partially Met      OT LONG TERM GOAL #2   Title  Patient will complete donning long sleeve shirt with modified independence.    Baseline  requires assist with long sleeves, can perform short sleeves.    Time  12    Period  Weeks    Status  Achieved      OT LONG TERM GOAL #3   Title  Patient will improve coordination to manage buttons on clothing with occasional assistance only.    Baseline  requires assist with buttons each time    Time  12    Period  Weeks    Status  Partially Met      OT LONG TERM GOAL #4   Title  Patient will complete managing pants after toileting with modified independence.     Baseline  assist most days     Time  12    Period  Weeks    Status  Achieved      OT LONG TERM GOAL #5   Title  Patient will demonstrate turning behaviors with rollator using large steps and keeping feet behind the rollator with all turns with occasional minimal cues.     Time  12    Period  Weeks    Status  On-going      OT LONG TERM GOAL #6   Title  Patient will improve gait speed and endurance and be able to walk 950 feet in 6 minutes to negotiate around the home and community safely in 4 weeks    Baseline  860 at reassessment    Time  4    Period  Weeks    Status  Achieved      OT LONG TERM GOAL #7   Title   Patient will complete HEP for maximal daily exercises with  modified independence in 4 weeks    Time  12    Period  Weeks    Status  Partially Met      OT LONG TERM GOAL #8   Title  Patient will transfer from sit to stand without the use of arms safely and independently from a variety of chairs/surfaces in 4 weeks.     Baseline  difficulty from lower surfaces    Time  4    Period  Weeks    Status  Achieved      OT LONG TERM GOAL  #9   Baseline  Patient will demonstrate decreased episodes of freezing of behaviors from score of 16 to score of less than 12.    Time  4    Period  Weeks    Status  Achieved            Plan - 04/25/18 1040    Clinical Impression Statement  Patient demonstrates increased bradykinesia this date and reports of "brain fog" the last few days.  Did not perform 6 min walk but will perform in the next 1-2 sessions when patient is feeling better.  He has continued to progress especially with positioning of self with rollator during turns, he still requires verbal cues but is showing signs of integrating concept and with occasional spontaneous reaction to it.  He will still need more work on diminishing freezing of gait patterns.  We have worked on this the last couple weeks at an offsite clinic which patient is unfamiliar with the environment and he has shown good progress.  He also had a recent day trip with his family and they all were very pleased with his progress and ability to spend the day out without incidence.  Continue to work towards calibration of movements with functional mobility, transfers, turning behaviors.      Occupational Profile and client history currently impacting functional performance  progressive disease process, freezing of gait behaviors, fall risk    Occupational performance deficits (Please refer to evaluation for details):  ADL's;IADL's;Leisure    Rehab Potential  Good    Current Impairments/barriers affecting progress:  positive: family  support, negative: level of motivation, progressive disease    OT Frequency  2x / week    OT Duration  12 weeks    OT Treatment/Interventions  Self-care/ADL training;Moist Heat;DME and/or AE instruction;Balance training;Therapeutic activities;Therapeutic exercise;Neuromuscular education;Functional Mobility Training;Patient/family education    Consulted and Agree with Plan of Care  Patient       Patient will benefit from skilled therapeutic intervention in order to improve the following deficits and impairments:  Abnormal gait, Decreased coordination, Decreased range of motion, Difficulty walking, Decreased endurance, Decreased safety awareness, Decreased activity tolerance, Decreased balance, Impaired UE functional use, Pain, Decreased mobility, Decreased strength  Visit Diagnosis: Unsteadiness on feet  Abnormal posture  Difficulty in walking, not elsewhere classified  Other lack of coordination  Muscle weakness (generalized)    Problem List Patient Active Problem List   Diagnosis Date Noted  . Dementia due to Parkinson's disease without behavioral disturbance ([place]) 07/18/2017  . Neurogenic orthostatic hypotension ([place]) 12/02/2016  . BPH with urinary obstruction 11/14/2016  . Urinary frequency 10/10/2016  . Microscopic hematuria 10/10/2016  . Parkinson's syndrome ([place]) 06/14/2016  . Arthritis 06/14/2016  . Depression 06/14/2016  . Heartburn 06/14/2016  . Urinary incontinence 06/14/2016  . Skin lesion 06/14/2016  . Dementia 06/14/2016  . Bilateral edema of lower extremity 06/14/2016   Amy T Lovett, OTR/L, CLT  Lovett,Amy 04/25/2018, 10:47 AM  [place] MAIN [facility] SERVICES [address] [place], [place], [address] Phone: [phone]   Fax:  [phone]  Name: Luke Durham MRN: [id number] Date of Birth: April 12, 1933

## 2018-04-27 ENCOUNTER — Ambulatory Visit: Payer: Medicare Other | Admitting: Occupational Therapy

## 2018-04-27 DIAGNOSIS — R278 Other lack of coordination: Secondary | ICD-10-CM

## 2018-04-27 DIAGNOSIS — R2681 Unsteadiness on feet: Secondary | ICD-10-CM

## 2018-04-27 DIAGNOSIS — M6281 Muscle weakness (generalized): Secondary | ICD-10-CM

## 2018-04-27 DIAGNOSIS — R262 Difficulty in walking, not elsewhere classified: Secondary | ICD-10-CM

## 2018-04-27 DIAGNOSIS — R293 Abnormal posture: Secondary | ICD-10-CM

## 2018-04-28 ENCOUNTER — Encounter: Payer: Self-pay | Admitting: Occupational Therapy

## 2018-04-28 NOTE — Therapy (Signed)
[place] [address], [place], [address] Phone: [phone]   Fax:  [phone]  Occupational Therapy Treatment  Patient Details  Name: Luke Durham MRN: [id number] Date of Birth: 07-19-1933 No data recorded  Encounter Date: 04/27/2018  OT End of Session - 04/28/18 1037    Visit Number  121    Number of Visits  132    Date for OT Re-Evaluation  05/02/18    OT Start Time  1058    OT Stop Time  1155    OT Time Calculation (min)  57 min    Activity Tolerance  Patient tolerated treatment well    Behavior During Therapy  [facility] for tasks assessed/performed       Past Medical History:  Diagnosis Date  . Anxiety   . Arthritis   . Asthma    childhood asthma  . Cancer ([place]) 7 years ago   lymphoma, [place] (recent)  . Chronic kidney disease   . Colon polyps   . GERD (gastroesophageal reflux disease)   . History of kidney stones   . Parkinson disease [facility])     Past Surgical History:  Procedure Laterality Date  . COLON SURGERY    . CYSTOSCOPY WITH LITHOLAPAXY N/A 11/14/2016   Procedure: CYSTOSCOPY WITH LITHOLAPAXY;  Surgeon: Hollice Espy, MD;  Location: [facility] ORS;  Service: Urology;  Laterality: N/A;  . EYE SURGERY Bilateral    Cataract Extraction with IOL  . HERNIA REPAIR  20 years  . MOHS SURGERY    . SMALL INTESTINE SURGERY     Per patient 7 years  . TRANSURETHRAL RESECTION OF PROSTATE N/A 11/14/2016   Procedure: TRANSURETHRAL RESECTION OF THE PROSTATE (TURP);  Surgeon: Hollice Espy, MD;  Location: [facility] ORS;  Service: Urology;  Laterality: N/A;    There were no vitals filed for this visit.  Subjective Assessment - 04/28/18 1035    Subjective   Patient states, "I am just tired of being sick".  Reports he will be going with his daughter and wife shopping tomorrow and out to lunch at the [facility].     Pertinent History  Patient reports he was diagnosed with Parkinson's disease about 10 plus  years ago.  He reports a more recent decline in function in his daily activities in the last 6-12 months.  He has been seeing PT for the last couple months.    Patient Stated Goals  Patient reports he wants to be able to do more for himself and around the house.     Currently in Pain?  No/denies    Pain Score  0-No pain                   OT Treatments/Exercises (OP) - 04/28/18 0001      Neurological Re-education Exercises   Other Exercises 1  Functional mobility with rollator crossing thresholds, moving forwards and backwards with cues. Turning in small spaces with cues for positioning of self behind the rollator. Postural exercises with manual stretch to pec area followed by wall exercises, cues for head positioning and scapular retraction.  Emphasis on calibration of movements and increasing amplitude of steps.     Other Exercises 2  Patient seen for instruction of LSVT BIG exercises: LSVT Daily Session Maximal Daily Exercises: Sustained movements are designed to rescale the amplitude of movement output for generalization to daily functional activities. Performed as follows for 1 set of 10 repetitions each: Multi directional  sustained movements- 1) Floor to ceiling, 2) Side to side. Multi directional Repetitive movements performed in standing and are designed to provide retraining effort needed for sustained muscle activation in tasks Performed as follows: 3) Step and reach forward, 4) Step and Reach Backwards, 5) Step and reach sideways, 6) Rock and reach forward/backward, 7) Rock and reach sideways.  SBA for most exercises in standing except CGA for stepping backwards exercise, along with verbal cues for proper form and technique.             OT Education - 04/28/18 1036    Education provided  Yes    Education Details  turning with rollator, stepping patterns especially when ambulating in the community.    Person(s) Educated  Patient    Methods   Demonstration;Explanation;Verbal cues;Tactile cues    Comprehension  Verbal cues required;Returned demonstration;Verbalized understanding;Tactile cues required          OT Long Term Goals - 04/25/18 1038      OT LONG TERM GOAL #1   Title  Patient will demonstrate increase right hand grip to be able to cut food with modified independence.     Baseline  occasional assist required.    Time  12    Period  Weeks    Status  Partially Met      OT LONG TERM GOAL #2   Title  Patient will complete donning long sleeve shirt with modified independence.    Baseline  requires assist with long sleeves, can perform short sleeves.    Time  12    Period  Weeks    Status  Achieved      OT LONG TERM GOAL #3   Title  Patient will improve coordination to manage buttons on clothing with occasional assistance only.    Baseline  requires assist with buttons each time    Time  12    Period  Weeks    Status  Partially Met      OT LONG TERM GOAL #4   Title  Patient will complete managing pants after toileting with modified independence.     Baseline  assist most days     Time  12    Period  Weeks    Status  Achieved      OT LONG TERM GOAL #5   Title  Patient will demonstrate turning behaviors with rollator using large steps and keeping feet behind the rollator with all turns with occasional minimal cues.     Time  12    Period  Weeks    Status  On-going      OT LONG TERM GOAL #6   Title  Patient will improve gait speed and endurance and be able to walk 950 feet in 6 minutes to negotiate around the home and community safely in 4 weeks    Baseline  860 at reassessment    Time  4    Period  Weeks    Status  Achieved      OT LONG TERM GOAL #7   Title  Patient will complete HEP for maximal daily exercises with modified independence in 4 weeks    Time  12    Period  Weeks    Status  Partially Met      OT LONG TERM GOAL #8   Title  Patient will transfer from sit to stand without the use of  arms safely and independently from a variety of chairs/surfaces in 4 weeks.  Baseline  difficulty from lower surfaces    Time  4    Period  Weeks    Status  Achieved      OT LONG TERM GOAL  #9   Baseline  Patient will demonstrate decreased episodes of freezing of behaviors from score of 16 to score of less than 12.    Time  4    Period  Weeks    Status  Achieved            Plan - 04/28/18 1037    Clinical Impression Statement  Focused this date on use of rollator with emphasis on crossing thresholds, turning in small spaces, backing up in small spaces, moving forwards and backwards through doorways.  Patient progressing well with these tasks and still reqiures cues at times for positioning of self behind rollator with turns.  He is showing greater consistency with this task.   Patient has been able to spend more time out with his family on longer excursions such as shopping and lunch.  Continue to work towards goals to improve safety, balance, functional mobility and strength to perform daily tasks.     Occupational Profile and client history currently impacting functional performance  progressive disease process, freezing of gait behaviors, fall risk    Occupational performance deficits (Please refer to evaluation for details):  ADL's;IADL's;Leisure    Rehab Potential  Good    Current Impairments/barriers affecting progress:  positive: family support, negative: level of motivation, progressive disease    OT Frequency  2x / week    OT Duration  12 weeks    OT Treatment/Interventions  Self-care/ADL training;Moist Heat;DME and/or AE instruction;Balance training;Therapeutic activities;Therapeutic exercise;Neuromuscular education;Functional Mobility Training;Patient/family education    Consulted and Agree with Plan of Care  Patient       Patient will benefit from skilled therapeutic intervention in order to improve the following deficits and impairments:  Abnormal gait, Decreased  coordination, Decreased range of motion, Difficulty walking, Decreased endurance, Decreased safety awareness, Decreased activity tolerance, Decreased balance, Impaired UE functional use, Pain, Decreased mobility, Decreased strength  Visit Diagnosis: Unsteadiness on feet  Abnormal posture  Difficulty in walking, not elsewhere classified  Other lack of coordination  Muscle weakness (generalized)    Problem List Patient Active Problem List   Diagnosis Date Noted  . Dementia due to Parkinson's disease without behavioral disturbance ([place]) 07/18/2017  . Neurogenic orthostatic hypotension ([place]) 12/02/2016  . BPH with urinary obstruction 11/14/2016  . Urinary frequency 10/10/2016  . Microscopic hematuria 10/10/2016  . Parkinson's syndrome ([place]) 06/14/2016  . Arthritis 06/14/2016  . Depression 06/14/2016  . Heartburn 06/14/2016  . Urinary incontinence 06/14/2016  . Skin lesion 06/14/2016  . Dementia 06/14/2016  . Bilateral edema of lower extremity 06/14/2016   Amy T Lovett, OTR/L, CLT  Lovett,Amy 04/28/2018, 11:26 AM  [place] [facility] [address], [place], [address] Phone: [phone]   Fax:  [phone]  Name: Luke Durham MRN: [id number] Date of Birth: February 20, 1933

## 2018-04-30 ENCOUNTER — Ambulatory Visit: Payer: Medicare Other | Admitting: Occupational Therapy

## 2018-04-30 ENCOUNTER — Encounter: Payer: Self-pay | Admitting: Occupational Therapy

## 2018-04-30 DIAGNOSIS — R2681 Unsteadiness on feet: Secondary | ICD-10-CM | POA: Diagnosis not present

## 2018-04-30 DIAGNOSIS — R262 Difficulty in walking, not elsewhere classified: Secondary | ICD-10-CM

## 2018-04-30 DIAGNOSIS — R278 Other lack of coordination: Secondary | ICD-10-CM

## 2018-04-30 DIAGNOSIS — M6281 Muscle weakness (generalized): Secondary | ICD-10-CM

## 2018-04-30 DIAGNOSIS — R293 Abnormal posture: Secondary | ICD-10-CM

## 2018-05-03 ENCOUNTER — Ambulatory Visit: Payer: Medicare Other | Admitting: Occupational Therapy

## 2018-05-03 DIAGNOSIS — R2681 Unsteadiness on feet: Secondary | ICD-10-CM

## 2018-05-03 DIAGNOSIS — R293 Abnormal posture: Secondary | ICD-10-CM

## 2018-05-03 DIAGNOSIS — M6281 Muscle weakness (generalized): Secondary | ICD-10-CM

## 2018-05-03 DIAGNOSIS — R262 Difficulty in walking, not elsewhere classified: Secondary | ICD-10-CM

## 2018-05-03 DIAGNOSIS — R278 Other lack of coordination: Secondary | ICD-10-CM

## 2018-05-04 ENCOUNTER — Encounter: Payer: Medicare Other | Admitting: Occupational Therapy

## 2018-05-04 ENCOUNTER — Telehealth: Payer: Self-pay | Admitting: Urology

## 2018-05-04 NOTE — Telephone Encounter (Signed)
Patient's wife called and left message on the voicemail asking to come in to drop off a UA for hematuria. I called back and could not get an answer or voicemail. The patient has not been seen since 2018 and would need an office visit to see a provider if they call back.  Sharyn Lull

## 2018-05-07 ENCOUNTER — Ambulatory Visit: Payer: Medicare Other | Admitting: Occupational Therapy

## 2018-05-07 DIAGNOSIS — R262 Difficulty in walking, not elsewhere classified: Secondary | ICD-10-CM

## 2018-05-07 DIAGNOSIS — R293 Abnormal posture: Secondary | ICD-10-CM

## 2018-05-07 DIAGNOSIS — R2681 Unsteadiness on feet: Secondary | ICD-10-CM

## 2018-05-07 DIAGNOSIS — R278 Other lack of coordination: Secondary | ICD-10-CM

## 2018-05-07 DIAGNOSIS — M6281 Muscle weakness (generalized): Secondary | ICD-10-CM

## 2018-05-09 ENCOUNTER — Encounter: Payer: Self-pay | Admitting: Occupational Therapy

## 2018-05-09 NOTE — Progress Notes (Signed)
05/10/2018 11:44 AM   [name] Nov 18, 1932 [id number]  Referring provider: Mar Daring, PA-C [place], [place] [address]  Chief Complaint  Patient presents with  . Benign Prostatic Hypertrophy    HPI: Patient is an 84 year old Caucasian male with a history of Parkinson's disease, bladder outlet obstruction, bladder stones who is status post TURP, cystolitholapaxy in January 2018 with Dr. Erlene Quan who was experiencing gross hematuria with, Pincus Badder.    His wife states that he has been having a little bit of blood off and on over the last few weeks.      CTU in 10/2016 noted three large stones in the urinary bladder measuring up to 1.7 x 2.4 cm. In addition, there is a 2 mm nonobstructive calculus in the upper pole collecting system of the left kidney. No ureteral stones or findings of urinary tract obstruction are noted at this time.  Prostatomegaly with median lobe hypertrophy.  Aortic atherosclerosis, in addition to left main and 3 vessel coronary artery disease.  Emphysema.  Additional incidental findings, as above.  He is not a smoker.   He is having associated nocturia and incontinence.  His PVR 11 mL.  His UA today is positive for 3-10 red blood cells.  Patient denies any gross hematuria, dysuria or suprapubic/flank pain.  Patient denies any fevers, chills, nausea or vomiting.   KUB today (05/10/2018) No definite urinary tract calcification.  Increased stool in colon.    PMH: Past Medical History:  Diagnosis Date  . Anxiety   . Arthritis   . Asthma    childhood asthma  . Cancer ([place]) 7 years ago   lymphoma, [place] (recent)  . Chronic kidney disease   . Colon polyps   . GERD (gastroesophageal reflux disease)   . History of kidney stones   . Parkinson disease [facility])     Surgical History: Past Surgical History:  Procedure Laterality Date  . COLON SURGERY    . CYSTOSCOPY WITH LITHOLAPAXY N/A 11/14/2016   Procedure: CYSTOSCOPY WITH  LITHOLAPAXY;  Surgeon: Hollice Espy, MD;  Location: [facility] ORS;  Service: Urology;  Laterality: N/A;  . EYE SURGERY Bilateral    Cataract Extraction with IOL  . HERNIA REPAIR  20 years  . MOHS SURGERY    . SMALL INTESTINE SURGERY     Per patient 7 years  . TRANSURETHRAL RESECTION OF PROSTATE N/A 11/14/2016   Procedure: TRANSURETHRAL RESECTION OF THE PROSTATE (TURP);  Surgeon: Hollice Espy, MD;  Location: [facility] ORS;  Service: Urology;  Laterality: N/A;    Home Medications:  Allergies as of 05/10/2018   No Known Allergies     Medication List        Accurate as of 05/10/18 11:44 AM. Always use your most recent med list.          Amantadine HCl 100 MG tablet Take 100 mg by mouth daily.   aspirin 81 MG tablet Take 81 mg by mouth daily.   carbidopa-levodopa 25-100 MG tablet Commonly known as:  SINEMET IR take 2 tablets by mouth four times a day as directed   carbidopa-levodopa 50-200 MG tablet Commonly known as:  SINEMET CR Take 1 tablet by mouth at bedtime. With the Aricept   cholecalciferol 1000 units tablet Commonly known as:  VITAMIN D Take 1,000 Units by mouth daily.   docusate sodium 100 MG capsule Commonly known as:  COLACE Take 1 capsule (100 mg total) by mouth 2 (two) times daily.   donepezil 10  MG tablet Commonly known as:  ARICEPT Take 10 mg by mouth at bedtime.   econazole nitrate 1 % cream Apply 1 application topically daily. Applied to feet   furosemide 20 MG tablet Commonly known as:  LASIX take 1 tablet by mouth once daily   multivitamin capsule Take 1 capsule by mouth daily.   niacinamide 500 MG tablet   nitrofurantoin (macrocrystal-monohydrate) 100 MG capsule Commonly known as:  MACROBID Take 1 capsule (100 mg total) by mouth every 12 (twelve) hours.   NUPLAZID 17 MG Tabs Generic drug:  Pimavanserin Tartrate Take 17 mg by mouth daily.   NUPLAZID 34 MG Caps Generic drug:  Pimavanserin Tartrate Take 34 mg by mouth daily.   OCUVITE  PO Take 1 tablet by mouth daily.   sertraline 50 MG tablet Commonly known as:  ZOLOFT TAKE 1 TABLET BY MOUTH EVERY DAY   THERATEARS OP Place 1-2 drops into both eyes at bedtime.       Allergies: No Known Allergies  Family History: Family History  Problem Relation Age of Onset  . Heart disease Father   . Bladder Cancer Neg Hx   . Prostate cancer Neg Hx   . Kidney cancer Neg Hx     Social History:  reports that he has never smoked. He has never used smokeless tobacco. He reports that he drinks alcohol. He reports that he does not use drugs.  ROS: UROLOGY Frequent Urination?: No Hard to postpone urination?: No Burning/pain with urination?: No Get up at night to urinate?: Yes Leakage of urine?: Yes Urine stream starts and stops?: No Trouble starting stream?: No Do you have to strain to urinate?: No Blood in urine?: Yes Urinary tract infection?: No Sexually transmitted disease?: No Injury to kidneys or bladder?: No Painful intercourse?: No Weak stream?: No Erection problems?: No Penile pain?: No  Gastrointestinal Nausea?: No Vomiting?: No Indigestion/heartburn?: No Diarrhea?: No Constipation?: Yes  Constitutional Fever: No Night sweats?: No Weight loss?: No Fatigue?: Yes  Skin Skin rash/lesions?: No Itching?: No  Eyes Blurred vision?: No Double vision?: No  Ears/Nose/Throat Sore throat?: No Sinus problems?: No  Hematologic/Lymphatic Swollen glands?: No Easy bruising?: Yes  Cardiovascular Leg swelling?: No Chest pain?: No  Respiratory Cough?: No Shortness of breath?: No  Endocrine Excessive thirst?: No  Musculoskeletal Back pain?: No Joint pain?: No  Neurological Headaches?: No Dizziness?: No  Psychologic Depression?: Yes Anxiety?: Yes  Physical Exam: BP (!) 103/59   Pulse 63   Ht 5\' 7"  (1.702 m)   Wt 165 lb 11.2 oz (75.2 kg)   BMI 25.95 kg/m   Constitutional: Well nourished. Alert and oriented, No acute  distress. HEENT: [place] AT, moist mucus membranes. Trachea midline, no masses. Cardiovascular: No clubbing, cyanosis, or edema. Respiratory: Normal respiratory effort, no increased work of breathing. GI: Abdomen is soft, non tender, non distended, no abdominal masses. Liver and spleen not palpable.  No hernias appreciated.  Stool sample for occult testing is not indicated.   GU: No CVA tenderness.  No bladder fullness or masses.  Patient with circumcised phallus.  Urethral meatus is patent.  No penile discharge. No penile lesions or rashes. Scrotum without lesions, cysts, rashes and/or edema.  Testicles are located scrotally bilaterally. No masses are appreciated in the testicles. Left and right epididymis are normal. Rectal: Patient with  normal sphincter tone. Anus and perineum without scarring or rashes. No rectal masses are appreciated. Prostate is approximately 90 + grams, could only palpate the apex, no nodules are appreciated.  Seminal vesicles are normal. Skin: No rashes, bruises or suspicious lesions. Lymph: No cervical or inguinal adenopathy. Neurologic: Grossly intact, no focal deficits, moving all 4 extremities. Psychiatric: Normal mood and affect.   Laboratory Data: Lab Results  Component Value Date   WBC 4.9 03/08/2018   HGB 12.2 (L) 03/08/2018   HCT 38.1 03/08/2018   MCV 93 03/08/2018   PLT 200 03/08/2018    Lab Results  Component Value Date   CREATININE 0.86 03/08/2018     Urinalysis/ Pertinent Imaging: Results for orders placed or performed in visit on 05/10/18  Microscopic Examination  Result Value Ref Range   WBC, UA 0-5 0 - 5 /hpf   RBC, UA 3-10 (A) 0 - 2 /hpf   Epithelial Cells (non renal) None seen 0 - 10 /hpf   Mucus, UA Present (A) Not Estab.   Bacteria, UA Few (A) None seen/Few  Urinalysis, Complete  Result Value Ref Range   Specific Gravity, UA >1.030 (H) 1.005 - 1.030   pH, UA 5.5 5.0 - 7.5   Color, UA Yellow Yellow   Appearance Ur Clear Clear    Leukocytes, UA Trace (A) Negative   Protein, UA Trace (A) Negative/Trace   Glucose, UA Negative Negative   Ketones, UA Trace (A) Negative   RBC, UA Trace (A) Negative   Bilirubin, UA Negative Negative   Urobilinogen, Ur 0.2 0.2 - 1.0 mg/dL   Nitrite, UA Negative Negative   Microscopic Examination See below:   BLADDER SCAN AMB NON-IMAGING  Result Value Ref Range   Scan Result 53ml    Urinalysis See HPI and Epic  I have reviewed the labs.  Pertinent Imaging CLINICAL DATA:  Hematuria 1 week ago, history of kidney stones  EXAM: ABDOMEN - 1 VIEW  COMPARISON:  09/12/2017  FINDINGS: No definite urinary tract calcification.  Increased stool throughout colon.  Coils in pelvis from prior hernia repairs.  Prostatic calcifications and pelvic phleboliths.  No definite ureteral calcifications.  Degenerative changes of the lumbar spine.  IMPRESSION: No definite urinary tract calcification.  Increased stool in colon.   Electronically Signed   By: Lavonia Dana M.D.   On: 05/10/2018 17:15  I have independently reviewed the films  Assessment & Plan:    1. Gross hematuria Hematuria work up in 2018 - findings significant for bladder stones and massive prostate UA today is 3-10 RBC's, it is sent for culture will not prescribe an antibiotic at this time as patient is asymptomatic KUB negative for stones   2. History of bladder stone S/p cystolitholapaxy No crystals on UA today Discuss risk for recurrent stones, although should be lower status post recent outlet procedure - Urinalysis, Complete  3. Benign prostatic hyperplasia with urinary obstruction S/p TURP PVR excellent today - BLADDER SCAN AMB NON-IMAGING  4. Recurrent UTI History of recurrent UTIs prior to surgery  Currently asymptomatic with negative UA today  Zara Council, PA-C   [place] [address] [place], [place], [place] [address] [phone]

## 2018-05-09 NOTE — Therapy (Signed)
[place] MAIN [facility] SERVICES [address] [place], [place], [address] Phone: [phone]   Fax:  [phone]  Occupational Therapy Treatment  Patient Details  Name: Luke Durham MRN: [id number] Date of Birth: 1933-07-07 No data recorded  Encounter Date: 05/03/2018  OT End of Session - 05/09/18 1519    Visit Number  [id number]    Number of Visits  156    Date for OT Re-Evaluation  07/23/18    OT Start Time  1101    OT Stop Time  1200    OT Time Calculation (min)  59 min    Activity Tolerance  Patient tolerated treatment well    Behavior During Therapy  [facility] for tasks assessed/performed       Past Medical History:  Diagnosis Date  . Anxiety   . Arthritis   . Asthma    childhood asthma  . Cancer ([place]) 7 years ago   lymphoma, [place] (recent)  . Chronic kidney disease   . Colon polyps   . GERD (gastroesophageal reflux disease)   . History of kidney stones   . Parkinson disease [facility])     Past Surgical History:  Procedure Laterality Date  . COLON SURGERY    . CYSTOSCOPY WITH LITHOLAPAXY N/A 11/14/2016   Procedure: CYSTOSCOPY WITH LITHOLAPAXY;  Surgeon: Hollice Espy, MD;  Location: [facility] ORS;  Service: Urology;  Laterality: N/A;  . EYE SURGERY Bilateral    Cataract Extraction with IOL  . HERNIA REPAIR  20 years  . MOHS SURGERY    . SMALL INTESTINE SURGERY     Per patient 7 years  . TRANSURETHRAL RESECTION OF PROSTATE N/A 11/14/2016   Procedure: TRANSURETHRAL RESECTION OF THE PROSTATE (TURP);  Surgeon: Hollice Espy, MD;  Location: [facility] ORS;  Service: Urology;  Laterality: N/A;    There were no vitals filed for this visit.  Subjective Assessment - 05/09/18 1513    Subjective   Patient reports he is planning to out out to eat with family over the weekend, he has been doing well with getting in and out of places in the community,     Pertinent History  Patient reports he was diagnosed with Parkinson's disease about 10 plus years ago.  He  reports a more recent decline in function in his daily activities in the last 6-12 months.  He has been seeing PT for the last couple months.    Patient Stated Goals  Patient reports he wants to be able to do more for himself and around the house.     Currently in Pain?  No/denies    Pain Score  0-No pain                   OT Treatments/Exercises (OP) - 05/09/18 1514      Neurological Re-education Exercises   Other Exercises 1  Posture exercises standing at the wall with cues for shoulder retraction and head up, ball exercises for shoulder ROM for 2 sets of 12 reps.  Functional mobility with rollator for 3 trials of 200 feet, focused on turning in small spaces, backing up and navigating in crowded areas.     Other Exercises 2  Patient seen for instruction of LSVT BIG exercises: LSVT Daily Session Maximal Daily Exercises: Sustained movements are designed to rescale the amplitude of movement output for generalization to daily functional activities. Performed as follows for 1 set of 10 repetitions each: Multi directional sustained movements- 1) Floor to ceiling, 2) Side  to side. Multi directional Repetitive movements performed in standing and are designed to provide retraining effort needed for sustained muscle activation in tasks Performed as follows: 3) Step and reach forward, 4) Step and Reach Backwards, 5) Step and reach sideways, 6) Rock and reach forward/backward, 7) Rock and reach sideways.  CGA for exercises in standing for exercises , verbal and tactile cues to complete with correct form and arm placements.  Reciprocal stepping patterns in standing with SBA and use of one handrail.               OT Education - 05/09/18 1518    Education provided  Yes    Education Details  daily exercises, posture, turns    Person(s) Educated  Patient    Methods  Demonstration;Explanation;Verbal cues;Tactile cues    Comprehension  Verbal cues required;Returned demonstration;Verbalized  understanding;Tactile cues required          OT Long Term Goals - 05/09/18 1443      OT LONG TERM GOAL #1   Title  Patient will demonstrate increase right hand grip to be able to cut food with modified independence.     Baseline  occasional assist required.    Time  12    Period  Weeks    Status  Partially Met      OT LONG TERM GOAL #2   Title  Patient will complete donning long sleeve shirt with modified independence.    Baseline  requires assist with long sleeves, can perform short sleeves.    Time  12    Period  Weeks    Status  Achieved      OT LONG TERM GOAL #3   Title  Patient will improve coordination to manage buttons on clothing with occasional assistance only.    Baseline  requires assist with buttons each time    Time  12    Period  Weeks    Status  Partially Met      OT LONG TERM GOAL #4   Title  Patient will demonstrate upright posture with shoulders back and head up during exercises with minimal cues.      Baseline  moderate cues for positioning during exercises.     Time  12    Period  Weeks    Status  New    Target Date  07/23/18      OT LONG TERM GOAL #5   Title  Patient will demonstrate turning behaviors with rollator using large steps and keeping feet behind the rollator with all turns with occasional minimal cues.     Time  12    Period  Weeks    Status  On-going      OT LONG TERM GOAL #6   Title  Patient will improve gait speed and endurance and be able to walk 950 feet in 6 minutes to negotiate around the home and community safely in 4 weeks    Baseline  860 at reassessment, 767 at recert 7/8    Time  4    Period  Weeks    Status  On-going      OT LONG TERM GOAL #7   Title  Patient will complete HEP for maximal daily exercises with modified independence in 4 weeks    Time  12    Period  Weeks    Status  Partially Met      OT LONG TERM GOAL #8   Title  Patient will transfer from sit to  stand without the use of arms safely and  independently from a variety of chairs/surfaces in 4 weeks.     Baseline  difficulty from lower surfaces    Time  4    Period  Weeks    Status  Achieved      OT LONG TERM GOAL  #9   Baseline  Patient will demonstrate decreased episodes of freezing of behaviors from score of 16 to score of less than 12.    Time  4    Period  Weeks    Status  Achieved            Plan - 05/09/18 1519    Clinical Impression Statement  Patient does well with first few exercises in sitting but requires assist with exercises in standing.  He reports performing exercises at home but has difficulty with performing those in standing, continue to recommend patient use chair to assist with balance when performing at home.  Posture has improved over time but continues to require cues to correct during exercises and when focused on a task.  Continue to work towards improving independence in daily tasks.     Occupational Profile and client history currently impacting functional performance  progressive disease process, freezing of gait behaviors, fall risk    Occupational performance deficits (Please refer to evaluation for details):  ADL's;IADL's;Leisure    Rehab Potential  Good    Current Impairments/barriers affecting progress:  positive: family support, negative: level of motivation, progressive disease    OT Frequency  2x / week    OT Duration  12 weeks    OT Treatment/Interventions  Self-care/ADL training;Moist Heat;DME and/or AE instruction;Balance training;Therapeutic activities;Therapeutic exercise;Neuromuscular education;Functional Mobility Training;Patient/family education    Consulted and Agree with Plan of Care  Patient       Patient will benefit from skilled therapeutic intervention in order to improve the following deficits and impairments:  Abnormal gait, Decreased coordination, Decreased range of motion, Difficulty walking, Decreased endurance, Decreased safety awareness, Decreased activity  tolerance, Decreased balance, Impaired UE functional use, Pain, Decreased mobility, Decreased strength  Visit Diagnosis: Unsteadiness on feet  Abnormal posture  Difficulty in walking, not elsewhere classified  Other lack of coordination  Muscle weakness (generalized)    Problem List Patient Active Problem List   Diagnosis Date Noted  . Dementia due to Parkinson's disease without behavioral disturbance ([place]) 07/18/2017  . Neurogenic orthostatic hypotension ([place]) 12/02/2016  . BPH with urinary obstruction 11/14/2016  . Urinary frequency 10/10/2016  . Microscopic hematuria 10/10/2016  . Parkinson's syndrome ([place]) 06/14/2016  . Arthritis 06/14/2016  . Depression 06/14/2016  . Heartburn 06/14/2016  . Urinary incontinence 06/14/2016  . Skin lesion 06/14/2016  . Dementia 06/14/2016  . Bilateral edema of lower extremity 06/14/2016   Achilles Dunk, OTR/L, CLT  Lovett,Amy 05/09/2018, 3:23 PM  [place] MAIN [facility] SERVICES [address] [place], [place], [address] Phone: [phone]   Fax:  [phone]  Name: Luke Durham MRN: [id number] Date of Birth: 1933/08/18

## 2018-05-09 NOTE — Therapy (Addendum)
[place] MAIN [facility] SERVICES [address] [place], [place], [address] Phone: [phone]   Fax:  [phone]  Occupational Therapy Treatment/Recertification  Patient Details  Name: Luke Durham MRN: [id number] Date of Birth: 1933-05-08 No data recorded  Encounter Date: 04/30/2018    Past Medical History:  Diagnosis Date  . Anxiety   . Arthritis   . Asthma    childhood asthma  . Cancer ([place]) 7 years ago   lymphoma, [place] (recent)  . Chronic kidney disease   . Colon polyps   . GERD (gastroesophageal reflux disease)   . History of kidney stones   . Parkinson disease [facility])     Past Surgical History:  Procedure Laterality Date  . COLON SURGERY    . CYSTOSCOPY WITH LITHOLAPAXY N/A 11/14/2016   Procedure: CYSTOSCOPY WITH LITHOLAPAXY;  Surgeon: Hollice Espy, MD;  Location: [facility] ORS;  Service: Urology;  Laterality: N/A;  . EYE SURGERY Bilateral    Cataract Extraction with IOL  . HERNIA REPAIR  20 years  . MOHS SURGERY    . SMALL INTESTINE SURGERY     Per patient 7 years  . TRANSURETHRAL RESECTION OF PROSTATE N/A 11/14/2016   Procedure: TRANSURETHRAL RESECTION OF THE PROSTATE (TURP);  Surgeon: Hollice Espy, MD;  Location: [facility] ORS;  Service: Urology;  Laterality: N/A;    There were no vitals filed for this visit.  Subjective Assessment - 05/09/18 1434    Subjective   Patient reports he had a good weekend, tried to do some exercises, did not go out due to extreme heat.    Pertinent History  Patient reports he was diagnosed with Parkinson's disease about 10 plus years ago.  He reports a more recent decline in function in his daily activities in the last 6-12 months.  He has been seeing PT for the last couple months.    Patient Stated Goals  Patient reports he wants to be able to do more for himself and around the house.     Currently in Pain?  Yes    Pain Score  7     Pain Location  Shoulder    Pain Orientation  Right    Pain  Descriptors / Indicators  Aching    Pain Onset  More than a month ago    Pain Frequency  Intermittent    Multiple Pain Sites  No                   OT Treatments/Exercises (OP) - 05/09/18 1435      ADLs   ADL Comments  Patient is now participating more in self care tasks, dressing self without assistance, donning shoes and tying most days, occasional assist on days he feels worse.  He has been able to go out with family in recent weeks for excursions for 1/2 day to full day.        Neurological Re-education Exercises   Other Exercises 1  Seen for reassessment of skills, 6 minute walk test 890 feet, 5 times sit to stand 13 sec.     Other Exercises 2  Patient seen for instruction of LSVT BIG exercises: LSVT Daily Session Maximal Daily Exercises: Sustained movements are designed to rescale the amplitude of movement output for generalization to daily functional activities. Performed as follows for 1 set of 10 repetitions each: Multi directional sustained movements- 1) Floor to ceiling, 2) Side to side. Multi directional Repetitive movements performed in standing and are designed to provide retraining  effort needed for sustained muscle activation in tasks Performed as follows: 3) Step and reach forward, 4) Step and Reach Backwards, 5) Step and reach sideways, 6) Rock and reach forward/backward, 7) Rock and reach sideways.  SBA for most exercises in standing except CGA for stepping backwards exercise, along with verbal cues for proper form and technique.                  OT Long Term Goals - 05/09/18 1443      OT LONG TERM GOAL #1   Title  Patient will demonstrate increase right hand grip to be able to cut food with modified independence.     Baseline  occasional assist required.    Time  12    Period  Weeks    Status  Partially Met      OT LONG TERM GOAL #2   Title  Patient will complete donning long sleeve shirt with modified independence.    Baseline  requires assist  with long sleeves, can perform short sleeves.    Time  12    Period  Weeks    Status  Achieved      OT LONG TERM GOAL #3   Title  Patient will improve coordination to manage buttons on clothing with occasional assistance only.    Baseline  requires assist with buttons each time    Time  12    Period  Weeks    Status  Partially Met      OT LONG TERM GOAL #4   Title  Patient will demonstrate upright posture with shoulders back and head up during exercises with minimal cues.      Baseline  moderate cues for positioning during exercises.     Time  12    Period  Weeks    Status  New    Target Date  07/23/18      OT LONG TERM GOAL #5   Title  Patient will demonstrate turning behaviors with rollator using large steps and keeping feet behind the rollator with all turns with occasional minimal cues.     Time  12    Period  Weeks    Status  On-going      OT LONG TERM GOAL #6   Title  Patient will improve gait speed and endurance and be able to walk 950 feet in 6 minutes to negotiate around the home and community safely in 4 weeks    Baseline  860 at reassessment, 941 at recert 7/8    Time  4    Period  Weeks    Status  On-going      OT LONG TERM GOAL #7   Title  Patient will complete HEP for maximal daily exercises with modified independence in 4 weeks    Time  12    Period  Weeks    Status  Partially Met      OT LONG TERM GOAL #8   Title  Patient will transfer from sit to stand without the use of arms safely and independently from a variety of chairs/surfaces in 4 weeks.     Baseline  difficulty from lower surfaces    Time  4    Period  Weeks    Status  Achieved      OT LONG TERM GOAL  #9   Baseline  Patient will demonstrate decreased episodes of freezing of behaviors from score of 16 to score of less than 12.  Time  4    Period  Weeks    Status  Achieved         Plan - 04/30/18     Clinical Impression Statement Continued focus this week on use of rollator with  emphasis on crossing thresholds, turning in small spaces, backing up in small spaces, moving forwards and backwards through doorways to help with extinguishing freezing of gait behaviors.  Patient progressing well with these tasks and still reqiures cues at times for positioning of self behind rollator with turns.  He is showing greater consistency with this task.   Patient has been able to spend more time out with his family on longer excursions such as shopping and lunch.  Continue to work towards goals to improve safety, balance, functional mobility and strength to perform daily tasks.  Patient continues to benefit from skilled OT services to maximize safety and independence in daily tasks.     Occupational Profile and client history currently impacting functional performance  progressive disease process, freezing of gait behaviors, fall risk     Occupational performance deficits (Please refer to evaluation for details):  ADL's;IADL's;Leisure     Rehab Potential  Good     Current Impairments/barriers affecting progress:  positive: family support, negative: level of motivation, progressive disease     OT Frequency  2x / week     OT Duration  12 weeks     OT Treatment/Interventions  Self-care/ADL training;Moist Heat;DME and/or AE instruction;Balance training;Therapeutic activities;Therapeutic exercise;Neuromuscular education;Functional Mobility Training;Patient/family education     Consulted and Agree with Plan of Care  Patient          Patient will benefit from skilled therapeutic intervention in order to improve the following deficits and impairments:  Abnormal gait, Decreased coordination, Decreased range of motion, Difficulty walking, Decreased endurance, Decreased safety awareness, Decreased activity tolerance, Decreased balance, Impaired UE functional use, Pain, Decreased mobility, Decreased strength  Visit Diagnosis: Unsteadiness on feet  Abnormal posture  Difficulty in walking, not  elsewhere classified  Other lack of coordination  Muscle weakness (generalized)    Problem List Patient Active Problem List   Diagnosis Date Noted  . Dementia due to Parkinson's disease without behavioral disturbance ([place]) 07/18/2017  . Neurogenic orthostatic hypotension ([place]) 12/02/2016  . BPH with urinary obstruction 11/14/2016  . Urinary frequency 10/10/2016  . Microscopic hematuria 10/10/2016  . Parkinson's syndrome ([place]) 06/14/2016  . Arthritis 06/14/2016  . Depression 06/14/2016  . Heartburn 06/14/2016  . Urinary incontinence 06/14/2016  . Skin lesion 06/14/2016  . Dementia 06/14/2016  . Bilateral edema of lower extremity 06/14/2016   Achilles Dunk, OTR/L, CLT Lovett,Amy 05/09/2018, 2:48 PM  [place] MAIN [facility] SERVICES [address] [place], [place], [address] Phone: [phone]   Fax:  [phone]  Name: Luke Durham MRN: [id number] Date of Birth: 21-Apr-1933

## 2018-05-10 ENCOUNTER — Ambulatory Visit: Payer: Medicare Other | Admitting: Urology

## 2018-05-10 ENCOUNTER — Ambulatory Visit
Admission: RE | Admit: 2018-05-10 | Discharge: 2018-05-10 | Disposition: A | Discharge status: Home / Self Care | Payer: Medicare Other | Source: Ambulatory Visit | Attending: Urology | Admitting: Urology

## 2018-05-10 ENCOUNTER — Encounter: Payer: Self-pay | Admitting: Urology

## 2018-05-10 VITALS — BP 103/59 | HR 63 | Ht 67.0 in | Wt 165.7 lb

## 2018-05-10 DIAGNOSIS — R310 Gross hematuria: Secondary | ICD-10-CM | POA: Diagnosis not present

## 2018-05-10 DIAGNOSIS — Z87448 Personal history of other diseases of urinary system: Secondary | ICD-10-CM

## 2018-05-10 DIAGNOSIS — N390 Urinary tract infection, site not specified: Secondary | ICD-10-CM | POA: Diagnosis not present

## 2018-05-10 DIAGNOSIS — N400 Enlarged prostate without lower urinary tract symptoms: Secondary | ICD-10-CM

## 2018-05-10 LAB — URINALYSIS, COMPLETE
Bilirubin, UA: NEGATIVE
Glucose, UA: NEGATIVE
Nitrite, UA: NEGATIVE
Specific Gravity, UA: >1.03 — ABNORMAL HIGH (ref 1.005–1.030)
Urobilinogen, Ur: 0.2 mg/dL (ref 0.2–1.0)
pH, UA: 5.5 (ref 5.0–7.5)

## 2018-05-10 LAB — MICROSCOPIC EXAMINATION: Epithelial Cells (non renal): NONE SEEN /hpf (ref 0–10)

## 2018-05-10 LAB — BLADDER SCAN AMB NON-IMAGING

## 2018-05-11 ENCOUNTER — Ambulatory Visit: Payer: Medicare Other | Admitting: Occupational Therapy

## 2018-05-11 ENCOUNTER — Telehealth: Payer: Self-pay

## 2018-05-11 DIAGNOSIS — R278 Other lack of coordination: Secondary | ICD-10-CM

## 2018-05-11 DIAGNOSIS — R2681 Unsteadiness on feet: Secondary | ICD-10-CM

## 2018-05-11 DIAGNOSIS — R262 Difficulty in walking, not elsewhere classified: Secondary | ICD-10-CM

## 2018-05-11 DIAGNOSIS — M6281 Muscle weakness (generalized): Secondary | ICD-10-CM

## 2018-05-11 DIAGNOSIS — R293 Abnormal posture: Secondary | ICD-10-CM

## 2018-05-11 NOTE — Telephone Encounter (Signed)
Pt wife informed

## 2018-05-11 NOTE — Telephone Encounter (Signed)
-----   Message from Nori Riis, PA-C sent at 05/10/2018  8:08 PM EDT ----- Please let Mrs. Kuennen know that the X-ray did not show any kidney or bladder stones.

## 2018-05-12 ENCOUNTER — Encounter: Payer: Self-pay | Admitting: Occupational Therapy

## 2018-05-12 NOTE — Therapy (Signed)
[place] MAIN [facility] SERVICES [address] [place], [place], [address] Phone: [phone]   Fax:  [phone]  Occupational Therapy Treatment  Patient Details  Name: Luke Durham MRN: [id number] Date of Birth: [birth year] No data recorded  Encounter Date: 05/11/2018  OT End of Session - 05/12/18 1236    Visit Number  125    Number of Visits  156    Date for OT Re-Evaluation  07/23/18    OT Start Time  1100    OT Stop Time  1200    OT Time Calculation (min)  60 min    Activity Tolerance  Patient tolerated treatment well    Behavior During Therapy  [facility] for tasks assessed/performed       Past Medical History:  Diagnosis Date  . Anxiety   . Arthritis   . Asthma    childhood asthma  . Cancer ([place]) 7 years ago   lymphoma, [place] (recent)  . Chronic kidney disease   . Colon polyps   . GERD (gastroesophageal reflux disease)   . History of kidney stones   . Parkinson disease [facility])     Past Surgical History:  Procedure Laterality Date  . COLON SURGERY    . CYSTOSCOPY WITH LITHOLAPAXY N/A 11/14/2016   Procedure: CYSTOSCOPY WITH LITHOLAPAXY;  Surgeon: Hollice Espy, MD;  Location: [facility] ORS;  Service: Urology;  Laterality: N/A;  . EYE SURGERY Bilateral    Cataract Extraction with IOL  . HERNIA REPAIR  20 years  . MOHS SURGERY    . SMALL INTESTINE SURGERY     Per patient 7 years  . TRANSURETHRAL RESECTION OF PROSTATE N/A 11/14/2016   Procedure: TRANSURETHRAL RESECTION OF THE PROSTATE (TURP);  Surgeon: Hollice Espy, MD;  Location: [facility] ORS;  Service: Urology;  Laterality: N/A;    There were no vitals filed for this visit.  Subjective Assessment - 05/12/18 1235    Subjective   Patient reports he feels his freezing is happening more at home.      Pertinent History  Patient reports he was diagnosed with Parkinson's disease about 10 plus years ago.  He reports a more recent decline in function in his daily activities in the last 6-12  months.  He has been seeing PT for the last couple months.    Patient Stated Goals  Patient reports he wants to be able to do more for himself and around the house.     Currently in Pain?  No/denies    Pain Score  0-No pain                   OT Treatments/Exercises (OP) - 05/12/18 1019      ADLs   ADL Comments  Patient seen for managing shoes to don and tie with crossed leg method with cues for positioning for optimal performance.  Each side performed for multiple trials.       Neurological Re-education Exercises   Other Exercises 1  Posture exercises standing at the wall with cues for shoulder retraction and head up, ball exercises for shoulder ROM for 2 sets of 12 reps. Manual stretch from therapist to open up pecs and improve posture which affects balance.  Patient performing active shoulder retraction with cues, 10 reps for 2 sets.  Functional mobility with rollator for 2 trials of 500 feet with occasional cues for amplitude of steps and size of turns, keeping behind rollator.      Other Exercises 2  Patient  seen for instruction of LSVT BIG exercises: LSVT Daily Session Maximal Daily Exercises: Sustained movements are designed to rescale the amplitude of movement output for generalization to daily functional activities. Performed as follows for 1 set of 10 repetitions each: Multi directional sustained movements- 1) Floor to ceiling, 2) Side to side. Multi directional Repetitive movements performed in standing and are designed to provide retraining effort needed for sustained muscle activation in tasks Performed as follows: 3) Step and reach forward, 4) Step and Reach Backwards, 5) Step and reach sideways, 6) Rock and reach forward/backward, 7) Rock and reach sideways.  CGA for exercises in standing for exercises , verbal and tactile cues to complete with correct form and arm placements.  Reciprocal stepping patterns in standing with SBA and use of one handrail.  Negotiation up and down  5 steps with use of one handrail with cues for turning at the top to increase amplitude of turns, cues to place full foot on step for improved balance.              OT Education - 05/12/18 1235    Education provided  Yes    Education Details  freezing of gait, changes to environment to help decrease freezing    Person(s) Educated  Patient    Methods  Demonstration;Explanation;Verbal cues;Tactile cues    Comprehension  Verbal cues required;Returned demonstration;Verbalized understanding;Tactile cues required          OT Long Term Goals - 05/09/18 1443      OT LONG TERM GOAL #1   Title  Patient will demonstrate increase right hand grip to be able to cut food with modified independence.     Baseline  occasional assist required.    Time  12    Period  Weeks    Status  Partially Met      OT LONG TERM GOAL #2   Title  Patient will complete donning long sleeve shirt with modified independence.    Baseline  requires assist with long sleeves, can perform short sleeves.    Time  12    Period  Weeks    Status  Achieved      OT LONG TERM GOAL #3   Title  Patient will improve coordination to manage buttons on clothing with occasional assistance only.    Baseline  requires assist with buttons each time    Time  12    Period  Weeks    Status  Partially Met      OT LONG TERM GOAL #4   Title  Patient will demonstrate upright posture with shoulders back and head up during exercises with minimal cues.      Baseline  moderate cues for positioning during exercises.     Time  12    Period  Weeks    Status  New    Target Date  07/23/18      OT LONG TERM GOAL #5   Title  Patient will demonstrate turning behaviors with rollator using large steps and keeping feet behind the rollator with all turns with occasional minimal cues.     Time  12    Period  Weeks    Status  On-going      OT LONG TERM GOAL #6   Title  Patient will improve gait speed and endurance and be able to walk 950  feet in 6 minutes to negotiate around the home and community safely in 4 weeks    Baseline  860 at  reassessment, 025 at recert 7/8    Time  4    Period  Weeks    Status  On-going      OT LONG TERM GOAL #7   Title  Patient will complete HEP for maximal daily exercises with modified independence in 4 weeks    Time  12    Period  Weeks    Status  Partially Met      OT LONG TERM GOAL #8   Title  Patient will transfer from sit to stand without the use of arms safely and independently from a variety of chairs/surfaces in 4 weeks.     Baseline  difficulty from lower surfaces    Time  4    Period  Weeks    Status  Achieved      OT LONG TERM GOAL  #9   Baseline  Patient will demonstrate decreased episodes of freezing of behaviors from score of 16 to score of less than 12.    Time  4    Period  Weeks    Status  Achieved            Plan - 05/12/18 1236    Clinical Impression Statement  Patient continues to progress in all areas despite feeling like he is experiencing freezing of gait more often.    Occupational Profile and client history currently impacting functional performance  progressive disease process, freezing of gait behaviors, fall risk    Occupational performance deficits (Please refer to evaluation for details):  ADL's;IADL's;Leisure    Rehab Potential  Good    Current Impairments/barriers affecting progress:  positive: family support, negative: level of motivation, progressive disease    OT Frequency  2x / week    OT Duration  12 weeks    OT Treatment/Interventions  Self-care/ADL training;Moist Heat;DME and/or AE instruction;Balance training;Therapeutic activities;Therapeutic exercise;Neuromuscular education;Functional Mobility Training;Patient/family education    Consulted and Agree with Plan of Care  Patient       Patient will benefit from skilled therapeutic intervention in order to improve the following deficits and impairments:  Abnormal gait, Decreased  coordination, Decreased range of motion, Difficulty walking, Decreased endurance, Decreased safety awareness, Decreased activity tolerance, Decreased balance, Impaired UE functional use, Pain, Decreased mobility, Decreased strength  Visit Diagnosis: Unsteadiness on feet  Abnormal posture  Difficulty in walking, not elsewhere classified  Muscle weakness (generalized)  Other lack of coordination    Problem List Patient Active Problem List   Diagnosis Date Noted  . Dementia due to Parkinson's disease without behavioral disturbance ([place]) 07/18/2017  . Neurogenic orthostatic hypotension ([place]) 12/02/2016  . BPH with urinary obstruction 11/14/2016  . Urinary frequency 10/10/2016  . Microscopic hematuria 10/10/2016  . Parkinson's syndrome ([place]) 06/14/2016  . Arthritis 06/14/2016  . Depression 06/14/2016  . Heartburn 06/14/2016  . Urinary incontinence 06/14/2016  . Skin lesion 06/14/2016  . Dementia 06/14/2016  . Bilateral edema of lower extremity 06/14/2016   Achilles Dunk, OTR/L, CLT  Lovett,Amy 05/12/2018, 12:39 PM  [place] MAIN [facility] SERVICES [address] [place], [place], [address] Phone: [phone]   Fax:  [phone]  Name: Luke Durham MRN: [id number] Date of Birth: May 08, 1933

## 2018-05-12 NOTE — Therapy (Signed)
[place] MAIN [facility] SERVICES [address] [place], [place], [address] Phone: [phone]   Fax:  [phone]  Occupational Therapy Treatment  Patient Details  Name: Luke Durham MRN: [id number] Date of Birth: 01-Jun-1933 No data recorded  Encounter Date: 05/07/2018  OT End of Session - 05/12/18 1025    Visit Number  124    Number of Visits  156    OT Start Time  1100    OT Stop Time  9169    OT Time Calculation (min)  57 min    Activity Tolerance  Patient tolerated treatment well    Behavior During Therapy  [facility] for tasks assessed/performed       Past Medical History:  Diagnosis Date  . Anxiety   . Arthritis   . Asthma    childhood asthma  . Cancer ([place]) 7 years ago   lymphoma, [place] (recent)  . Chronic kidney disease   . Colon polyps   . GERD (gastroesophageal reflux disease)   . History of kidney stones   . Parkinson disease [facility])     Past Surgical History:  Procedure Laterality Date  . COLON SURGERY    . CYSTOSCOPY WITH LITHOLAPAXY N/A 11/14/2016   Procedure: CYSTOSCOPY WITH LITHOLAPAXY;  Surgeon: Hollice Espy, MD;  Location: [facility] ORS;  Service: Urology;  Laterality: N/A;  . EYE SURGERY Bilateral    Cataract Extraction with IOL  . HERNIA REPAIR  20 years  . MOHS SURGERY    . SMALL INTESTINE SURGERY     Per patient 7 years  . TRANSURETHRAL RESECTION OF PROSTATE N/A 11/14/2016   Procedure: TRANSURETHRAL RESECTION OF THE PROSTATE (TURP);  Surgeon: Hollice Espy, MD;  Location: [facility] ORS;  Service: Urology;  Laterality: N/A;    There were no vitals filed for this visit.  Subjective Assessment - 05/12/18 1018    Subjective   Patient reports he feels he is reliant on the walker at all times, "what's next, a wheelchair?"    Pertinent History  Patient reports he was diagnosed with Parkinson's disease about 10 plus years ago.  He reports a more recent decline in function in his daily activities in the last 6-12 months.  He has  been seeing PT for the last couple months.    Patient Stated Goals  Patient reports he wants to be able to do more for himself and around the house.     Currently in Pain?  No/denies    Pain Score  0-No pain                   OT Treatments/Exercises (OP) - 05/12/18 1019      ADLs   ADL Comments  Patient seen for managing shoes to don and tie with crossed leg method with cues for positioning for optimal performance.  Each side performed for multiple trials.       Neurological Re-education Exercises   Other Exercises 1  Posture exercises standing at the wall with cues for shoulder retraction and head up, ball exercises for shoulder ROM for 2 sets of 12 reps. Manual stretch from therapist to open up pecs and improve posture which affects balance.  Patient performing active shoulder retraction with cues, 10 reps for 2 sets.  Functional mobility with rollator for 2 trials of 500 feet with occasional cues for amplitude of steps and size of turns, keeping behind rollator.      Other Exercises 2  Patient seen for instruction of LSVT  BIG exercises: LSVT Daily Session Maximal Daily Exercises: Sustained movements are designed to rescale the amplitude of movement output for generalization to daily functional activities. Performed as follows for 1 set of 10 repetitions each: Multi directional sustained movements- 1) Floor to ceiling, 2) Side to side. Multi directional Repetitive movements performed in standing and are designed to provide retraining effort needed for sustained muscle activation in tasks Performed as follows: 3) Step and reach forward, 4) Step and Reach Backwards, 5) Step and reach sideways, 6) Rock and reach forward/backward, 7) Rock and reach sideways.  CGA for exercises in standing for exercises , verbal and tactile cues to complete with correct form and arm placements.  Reciprocal stepping patterns in standing with SBA and use of one handrail.  Negotiation up and down 5 steps with use  of one handrail with cues for turning at the top to increase amplitude of turns, cues to place full foot on step for improved balance.              OT Education - 05/12/18 1024    Education provided  Yes    Education Details  posture, HEP    Person(s) Educated  Patient    Methods  Demonstration;Explanation;Verbal cues;Tactile cues    Comprehension  Verbal cues required;Returned demonstration;Verbalized understanding;Tactile cues required          OT Long Term Goals - 05/09/18 1443      OT LONG TERM GOAL #1   Title  Patient will demonstrate increase right hand grip to be able to cut food with modified independence.     Baseline  occasional assist required.    Time  12    Period  Weeks    Status  Partially Met      OT LONG TERM GOAL #2   Title  Patient will complete donning long sleeve shirt with modified independence.    Baseline  requires assist with long sleeves, can perform short sleeves.    Time  12    Period  Weeks    Status  Achieved      OT LONG TERM GOAL #3   Title  Patient will improve coordination to manage buttons on clothing with occasional assistance only.    Baseline  requires assist with buttons each time    Time  12    Period  Weeks    Status  Partially Met      OT LONG TERM GOAL #4   Title  Patient will demonstrate upright posture with shoulders back and head up during exercises with minimal cues.      Baseline  moderate cues for positioning during exercises.     Time  12    Period  Weeks    Status  New    Target Date  07/23/18      OT LONG TERM GOAL #5   Title  Patient will demonstrate turning behaviors with rollator using large steps and keeping feet behind the rollator with all turns with occasional minimal cues.     Time  12    Period  Weeks    Status  On-going      OT LONG TERM GOAL #6   Title  Patient will improve gait speed and endurance and be able to walk 950 feet in 6 minutes to negotiate around the home and community safely in 4  weeks    Baseline  860 at reassessment, 967 at recert 7/8    Time  4  Period  Weeks    Status  On-going      OT LONG TERM GOAL #7   Title  Patient will complete HEP for maximal daily exercises with modified independence in 4 weeks    Time  12    Period  Weeks    Status  Partially Met      OT LONG TERM GOAL #8   Title  Patient will transfer from sit to stand without the use of arms safely and independently from a variety of chairs/surfaces in 4 weeks.     Baseline  difficulty from lower surfaces    Time  4    Period  Weeks    Status  Achieved      OT LONG TERM GOAL  #9   Baseline  Patient will demonstrate decreased episodes of freezing of behaviors from score of 16 to score of less than 12.    Time  4    Period  Weeks    Status  Achieved            Plan - 05/12/18 1025    Clinical Impression Statement  Patient reports some frustration with progress and feels he is dependent on the walker, we discussed the need for the walker for balance and as a means of safety.  He tends to have increased freezing of gait when he is not using the walker and reminded him of this and to be more aware.  Pt is aware that the therapist recommends using the walker at all times when moving especially when getting up to go to the bathroom at night.  Patient has some difficulty with being consistent  with being able to put on his shoes, multiple trials this date, cues as needed.  Continue to work towards goals in [place] to increase independence in daily tasks.     Occupational Profile and client history currently impacting functional performance  progressive disease process, freezing of gait behaviors, fall risk    Occupational performance deficits (Please refer to evaluation for details):  ADL's;IADL's;Leisure    Rehab Potential  Good    Current Impairments/barriers affecting progress:  positive: family support, negative: level of motivation, progressive disease    OT Frequency  2x / week    OT Duration   12 weeks    OT Treatment/Interventions  Self-care/ADL training;Moist Heat;DME and/or AE instruction;Balance training;Therapeutic activities;Therapeutic exercise;Neuromuscular education;Functional Mobility Training;Patient/family education    Consulted and Agree with Plan of Care  Patient       Patient will benefit from skilled therapeutic intervention in order to improve the following deficits and impairments:  Abnormal gait, Decreased coordination, Decreased range of motion, Difficulty walking, Decreased endurance, Decreased safety awareness, Decreased activity tolerance, Decreased balance, Impaired UE functional use, Pain, Decreased mobility, Decreased strength  Visit Diagnosis: Unsteadiness on feet  Abnormal posture  Difficulty in walking, not elsewhere classified  Other lack of coordination  Muscle weakness (generalized)    Problem List Patient Active Problem List   Diagnosis Date Noted  . Dementia due to Parkinson's disease without behavioral disturbance ([place]) 07/18/2017  . Neurogenic orthostatic hypotension ([place]) 12/02/2016  . BPH with urinary obstruction 11/14/2016  . Urinary frequency 10/10/2016  . Microscopic hematuria 10/10/2016  . Parkinson's syndrome ([place]) 06/14/2016  . Arthritis 06/14/2016  . Depression 06/14/2016  . Heartburn 06/14/2016  . Urinary incontinence 06/14/2016  . Skin lesion 06/14/2016  . Dementia 06/14/2016  . Bilateral edema of lower extremity 06/14/2016   Amy T Lovett, OTR/L, CLT  Lovett,Amy 05/12/2018, 10:31 AM  [place] MAIN [facility] SERVICES [address] [place], [place], [address] Phone: [phone]   Fax:  [phone]  Name: Luke Durham MRN: [id number] Date of Birth: July 15, 1933

## 2018-05-13 LAB — CULTURE, URINE COMPREHENSIVE

## 2018-05-14 ENCOUNTER — Other Ambulatory Visit: Payer: Self-pay | Admitting: Urology

## 2018-05-14 ENCOUNTER — Telehealth: Payer: Self-pay | Admitting: Urology

## 2018-05-14 ENCOUNTER — Ambulatory Visit: Payer: Medicare Other | Admitting: Occupational Therapy

## 2018-05-14 DIAGNOSIS — R310 Gross hematuria: Secondary | ICD-10-CM

## 2018-05-14 NOTE — Progress Notes (Signed)
RUS order is in

## 2018-05-14 NOTE — Telephone Encounter (Signed)
I have put the RUS orders in and I will call with the results.

## 2018-05-14 NOTE — Telephone Encounter (Signed)
Patient's wife notified and agreed to the [place]. I will just need an order for it so that I can get it scheduled. Will he need a follow up or will you call with results?  Thanks, Sharyn Lull

## 2018-05-15 NOTE — Telephone Encounter (Signed)
Sent to scheduling to schedule   Luke Durham

## 2018-05-17 ENCOUNTER — Ambulatory Visit: Payer: Medicare Other | Admitting: Occupational Therapy

## 2018-05-17 DIAGNOSIS — M6281 Muscle weakness (generalized): Secondary | ICD-10-CM

## 2018-05-17 DIAGNOSIS — R2681 Unsteadiness on feet: Secondary | ICD-10-CM

## 2018-05-17 DIAGNOSIS — R293 Abnormal posture: Secondary | ICD-10-CM

## 2018-05-17 DIAGNOSIS — R262 Difficulty in walking, not elsewhere classified: Secondary | ICD-10-CM

## 2018-05-17 DIAGNOSIS — R278 Other lack of coordination: Secondary | ICD-10-CM

## 2018-05-18 ENCOUNTER — Encounter: Payer: Medicare Other | Admitting: Occupational Therapy

## 2018-05-21 ENCOUNTER — Ambulatory Visit
Admission: RE | Admit: 2018-05-21 | Discharge: 2018-05-21 | Disposition: A | Discharge status: Home / Self Care | Payer: Medicare Other | Source: Ambulatory Visit | Attending: Urology | Admitting: Urology

## 2018-05-21 ENCOUNTER — Encounter: Payer: Medicare Other | Admitting: Occupational Therapy

## 2018-05-21 DIAGNOSIS — R310 Gross hematuria: Secondary | ICD-10-CM | POA: Diagnosis not present

## 2018-05-21 DIAGNOSIS — N400 Enlarged prostate without lower urinary tract symptoms: Secondary | ICD-10-CM | POA: Insufficient documentation

## 2018-05-22 ENCOUNTER — Ambulatory Visit: Payer: Medicare Other | Admitting: Occupational Therapy

## 2018-05-22 DIAGNOSIS — R293 Abnormal posture: Secondary | ICD-10-CM

## 2018-05-22 DIAGNOSIS — M6281 Muscle weakness (generalized): Secondary | ICD-10-CM

## 2018-05-22 DIAGNOSIS — R262 Difficulty in walking, not elsewhere classified: Secondary | ICD-10-CM

## 2018-05-22 DIAGNOSIS — R2681 Unsteadiness on feet: Secondary | ICD-10-CM | POA: Diagnosis not present

## 2018-05-22 DIAGNOSIS — R278 Other lack of coordination: Secondary | ICD-10-CM

## 2018-05-23 ENCOUNTER — Telehealth: Payer: Self-pay

## 2018-05-23 ENCOUNTER — Encounter: Payer: Self-pay | Admitting: Occupational Therapy

## 2018-05-23 NOTE — Therapy (Signed)
[place] MAIN [facility] SERVICES [address] [place], [place], [address] Phone: [phone]   Fax:  [phone]  Occupational Therapy Treatment  Patient Details  Name: Luke Durham MRN: [id number] Date of Birth: 03-04-1933 No data recorded  Encounter Date: 05/17/2018  OT End of Session - 05/23/18 1931    Visit Number  126    Number of Visits  156    Date for OT Re-Evaluation  07/23/18    OT Start Time  1102    OT Stop Time  1200    OT Time Calculation (min)  58 min    Activity Tolerance  Patient tolerated treatment well    Behavior During Therapy  [facility] for tasks assessed/performed       Past Medical History:  Diagnosis Date  . Anxiety   . Arthritis   . Asthma    childhood asthma  . Cancer ([place]) 7 years ago   lymphoma, [place] (recent)  . Chronic kidney disease   . Colon polyps   . GERD (gastroesophageal reflux disease)   . History of kidney stones   . Parkinson disease [facility])     Past Surgical History:  Procedure Laterality Date  . COLON SURGERY    . CYSTOSCOPY WITH LITHOLAPAXY N/A 11/14/2016   Procedure: CYSTOSCOPY WITH LITHOLAPAXY;  Surgeon: Hollice Espy, MD;  Location: [facility] ORS;  Service: Urology;  Laterality: N/A;  . EYE SURGERY Bilateral    Cataract Extraction with IOL  . HERNIA REPAIR  20 years  . MOHS SURGERY    . SMALL INTESTINE SURGERY     Per patient 7 years  . TRANSURETHRAL RESECTION OF PROSTATE N/A 11/14/2016   Procedure: TRANSURETHRAL RESECTION OF THE PROSTATE (TURP);  Surgeon: Hollice Espy, MD;  Location: [facility] ORS;  Service: Urology;  Laterality: N/A;    There were no vitals filed for this visit.  Subjective Assessment - 05/23/18 1927    Subjective   Patient reports no changes since last visit, he has been trying to perform exercises at home.  He feels he can perform seated exercises however he has more difficulty with exercises in standing even with the use of a chair.     Pertinent History  Patient reports  he was diagnosed with Parkinson's disease about 10 plus years ago.  He reports a more recent decline in function in his daily activities in the last 6-12 months.  He has been seeing PT for the last couple months.    Patient Stated Goals  Patient reports he wants to be able to do more for himself and around the house.     Currently in Pain?  No/denies    Pain Score  0-No pain                   OT Treatments/Exercises (OP) - 05/23/18 1929      Neurological Re-education Exercises   Other Exercises 1  Posture exercises standing at the wall with cues for shoulder retraction and head up, ball exercises for shoulder ROM for 2 sets of 12 reps. Manual stretch from therapist to open up pecs and improve posture which affects balance.  Patient performing active shoulder retraction with cues, 10 reps for 2 sets.     Other Exercises 2  Facilitation of movement of scapula in sidelying for shoulder elevation, retraction, upwards rotation with therapist assist. Facilitation of movement in supine for shoulder flexion, ABD, shoulder protraction and elbow extension. Patient complains of elbow flexion to 100 degrees.  One loss of balance during standing exercises which required min assist from therapist to recover.         Functional mobility with use of rollator with emphasis on turns in small spaces, calibration of movements, cues for amplitude and positioning of self In relation to rollator with turns       OT Education - 05/23/18 1930    Education provided  Yes    Education Details  posture, reciprocal stepping, balance    Person(s) Educated  Patient    Methods  Demonstration;Explanation;Verbal cues;Tactile cues    Comprehension  Verbal cues required;Returned demonstration;Verbalized understanding;Tactile cues required    Person(s) Educated  Patient          OT Long Term Goals - 05/09/18 1443      OT LONG TERM GOAL #1   Title  Patient will demonstrate increase right hand grip to be  able to cut food with modified independence.     Baseline  occasional assist required.    Time  12    Period  Weeks    Status  Partially Met      OT LONG TERM GOAL #2   Title  Patient will complete donning long sleeve shirt with modified independence.    Baseline  requires assist with long sleeves, can perform short sleeves.    Time  12    Period  Weeks    Status  Achieved      OT LONG TERM GOAL #3   Title  Patient will improve coordination to manage buttons on clothing with occasional assistance only.    Baseline  requires assist with buttons each time    Time  12    Period  Weeks    Status  Partially Met      OT LONG TERM GOAL #4   Title  Patient will demonstrate upright posture with shoulders back and head up during exercises with minimal cues.      Baseline  moderate cues for positioning during exercises.     Time  12    Period  Weeks    Status  New    Target Date  07/23/18      OT LONG TERM GOAL #5   Title  Patient will demonstrate turning behaviors with rollator using large steps and keeping feet behind the rollator with all turns with occasional minimal cues.     Time  12    Period  Weeks    Status  On-going      OT LONG TERM GOAL #6   Title  Patient will improve gait speed and endurance and be able to walk 950 feet in 6 minutes to negotiate around the home and community safely in 4 weeks    Baseline  860 at reassessment, 283 at recert 7/8    Time  4    Period  Weeks    Status  On-going      OT LONG TERM GOAL #7   Title  Patient will complete HEP for maximal daily exercises with modified independence in 4 weeks    Time  12    Period  Weeks    Status  Partially Met      OT LONG TERM GOAL #8   Title  Patient will transfer from sit to stand without the use of arms safely and independently from a variety of chairs/surfaces in 4 weeks.     Baseline  difficulty from lower surfaces    Time  4  Period  Weeks    Status  Achieved      OT LONG TERM GOAL  #9    Baseline  Patient will demonstrate decreased episodes of freezing of behaviors from score of 16 to score of less than 12.    Time  4    Period  Weeks    Status  Achieved            Plan - 05/23/18 1932    Clinical Impression Statement  Patient has not shown many episodes of freezing of gait in the last few months and if he demonstrates a mild initiation of freezing it does not last and he is able to perform strategies to move forward safely.  Today, patient demonstrated multiple episodes of freezing in the clinic with a delay in processing and being able to work through episode, increased cues both verbal and tactile.  Patient reports he took his medication on time and correct dosages.  Continue to focus on these areas to minimize freezing of gait episodes and to reduce fall risk.     Occupational Profile and client history currently impacting functional performance  progressive disease process, freezing of gait behaviors, fall risk    Occupational performance deficits (Please refer to evaluation for details):  ADL's;IADL's;Leisure    Rehab Potential  Good    Current Impairments/barriers affecting progress:  positive: family support, negative: level of motivation, progressive disease    OT Frequency  2x / week    OT Duration  12 weeks    OT Treatment/Interventions  Self-care/ADL training;Moist Heat;DME and/or AE instruction;Balance training;Therapeutic activities;Therapeutic exercise;Neuromuscular education;Functional Mobility Training;Patient/family education    Consulted and Agree with Plan of Care  Patient       Patient will benefit from skilled therapeutic intervention in order to improve the following deficits and impairments:  Abnormal gait, Decreased coordination, Decreased range of motion, Difficulty walking, Decreased endurance, Decreased safety awareness, Decreased activity tolerance, Decreased balance, Impaired UE functional use, Pain, Decreased mobility, Decreased  strength  Visit Diagnosis: Unsteadiness on feet  Abnormal posture  Difficulty in walking, not elsewhere classified  Muscle weakness (generalized)  Other lack of coordination    Problem List Patient Active Problem List   Diagnosis Date Noted  . Dementia due to Parkinson's disease without behavioral disturbance ([place]) 07/18/2017  . Neurogenic orthostatic hypotension ([place]) 12/02/2016  . BPH with urinary obstruction 11/14/2016  . Urinary frequency 10/10/2016  . Microscopic hematuria 10/10/2016  . Parkinson's syndrome ([place]) 06/14/2016  . Arthritis 06/14/2016  . Depression 06/14/2016  . Heartburn 06/14/2016  . Urinary incontinence 06/14/2016  . Skin lesion 06/14/2016  . Dementia 06/14/2016  . Bilateral edema of lower extremity 06/14/2016   Achilles Dunk, OTR/L, CLT  Lovett,Amy 05/23/2018, 7:38 PM  [place] MAIN [facility] SERVICES [address] [place], [place], [address] Phone: [phone]   Fax:  [phone]  Name: Luke Durham MRN: [id number] Date of Birth: 27-Jul-1933

## 2018-05-23 NOTE — Therapy (Signed)
[place] MAIN [facility] SERVICES [address] [place], [place], [address] Phone: [phone]   Fax:  [phone]  Occupational Therapy Treatment  Patient Details  Name: Luke Durham MRN: [id number] Date of Birth: 1932/11/29 No data recorded  Encounter Date: 05/22/2018  OT End of Session - 05/23/18 2017    Visit Number  127    Number of Visits  156    Date for OT Re-Evaluation  07/23/18    OT Start Time  1100    OT Stop Time  1155    OT Time Calculation (min)  55 min    Activity Tolerance  Patient tolerated treatment well    Behavior During Therapy  [facility] for tasks assessed/performed       Past Medical History:  Diagnosis Date  . Anxiety   . Arthritis   . Asthma    childhood asthma  . Cancer ([place]) 7 years ago   lymphoma, [place] (recent)  . Chronic kidney disease   . Colon polyps   . GERD (gastroesophageal reflux disease)   . History of kidney stones   . Parkinson disease [facility])     Past Surgical History:  Procedure Laterality Date  . COLON SURGERY    . CYSTOSCOPY WITH LITHOLAPAXY N/A 11/14/2016   Procedure: CYSTOSCOPY WITH LITHOLAPAXY;  Surgeon: Hollice Espy, MD;  Location: [facility] ORS;  Service: Urology;  Laterality: N/A;  . EYE SURGERY Bilateral    Cataract Extraction with IOL  . HERNIA REPAIR  20 years  . MOHS SURGERY    . SMALL INTESTINE SURGERY     Per patient 7 years  . TRANSURETHRAL RESECTION OF PROSTATE N/A 11/14/2016   Procedure: TRANSURETHRAL RESECTION OF THE PROSTATE (TURP);  Surgeon: Hollice Espy, MD;  Location: [facility] ORS;  Service: Urology;  Laterality: N/A;    There were no vitals filed for this visit.  Subjective Assessment - 05/23/18 2016    Subjective   patient reports he went for a ultrasound yesterday afternoon and waiting for the results regarding any kidney for gallstones. His blood pressure was taken in the clinic 112/60. Patient denied any changes this week however therapist noticed a Band-Aid over his  elbow inquired as to what happened. Patient reported he had a fall at home, he did not have his Rollator with him and was trying to walk without an assistive device. Patient reports he did not want to mention it because he knew he should have had his Rollator with him.    Pertinent History  Patient reports he was diagnosed with Parkinson's disease about 10 plus years ago.  He reports a more recent decline in function in his daily activities in the last 6-12 months.  He has been seeing PT for the last couple months.    Patient Stated Goals  Patient reports he wants to be able to do more for himself and around the house.     Currently in Pain?  No/denies    Pain Score  0-No pain                   OT Treatments/Exercises (OP) - 05/23/18 1929      Neurological Re-education Exercises   Other Exercises 1  Posture exercises standing at the wall with cues for shoulder retraction and head up, ball exercises for shoulder ROM for 2 sets of 12 reps. Manual stretch from therapist to open up pecs and improve posture which affects balance.  Patient performing active shoulder retraction with cues,  10 reps for 2 sets.     Other Exercises 2  Facilitation of movement of scapula in sidelying for shoulder elevation, retraction, upwards rotation with therapist assist. Facilitation of movement in supine for shoulder flexion, ABD, shoulder protraction and elbow extension. Patient complains of elbow flexion to 100 degrees. One loss of balance during standing exercises which required min assist from therapist to recover.               OT Education - 05/23/18 2017    Education provided  Yes    Education Details  safety, fall risk, use of rollator    Person(s) Educated  Patient    Methods  Demonstration;Explanation;Verbal cues;Tactile cues    Comprehension  Verbal cues required;Returned demonstration;Verbalized understanding;Tactile cues required          OT Long Term Goals - 05/09/18 1443       OT LONG TERM GOAL #1   Title  Patient will demonstrate increase right hand grip to be able to cut food with modified independence.     Baseline  occasional assist required.    Time  12    Period  Weeks    Status  Partially Met      OT LONG TERM GOAL #2   Title  Patient will complete donning long sleeve shirt with modified independence.    Baseline  requires assist with long sleeves, can perform short sleeves.    Time  12    Period  Weeks    Status  Achieved      OT LONG TERM GOAL #3   Title  Patient will improve coordination to manage buttons on clothing with occasional assistance only.    Baseline  requires assist with buttons each time    Time  12    Period  Weeks    Status  Partially Met      OT LONG TERM GOAL #4   Title  Patient will demonstrate upright posture with shoulders back and head up during exercises with minimal cues.      Baseline  moderate cues for positioning during exercises.     Time  12    Period  Weeks    Status  New    Target Date  07/23/18      OT LONG TERM GOAL #5   Title  Patient will demonstrate turning behaviors with rollator using large steps and keeping feet behind the rollator with all turns with occasional minimal cues.     Time  12    Period  Weeks    Status  On-going      OT LONG TERM GOAL #6   Title  Patient will improve gait speed and endurance and be able to walk 950 feet in 6 minutes to negotiate around the home and community safely in 4 weeks    Baseline  860 at reassessment, 160 at recert 7/8    Time  4    Period  Weeks    Status  On-going      OT LONG TERM GOAL #7   Title  Patient will complete HEP for maximal daily exercises with modified independence in 4 weeks    Time  12    Period  Weeks    Status  Partially Met      OT LONG TERM GOAL #8   Title  Patient will transfer from sit to stand without the use of arms safely and independently from a variety of chairs/surfaces in 4 weeks.  Baseline  difficulty from lower surfaces     Time  4    Period  Weeks    Status  Achieved      OT LONG TERM GOAL  #9   Baseline  Patient will demonstrate decreased episodes of freezing of behaviors from score of 16 to score of less than 12.    Time  4    Period  Weeks    Status  Achieved            Plan - 05/23/18 2017    Clinical Impression Statement  Patient with a reported fall this week at home without injury other than small scrape on his elbow. With analysis it appears patient was ambulating from the bedroom to the bathroom without the use of an assistive device. Patient has been canceled in the past regarding the need to always have his assistive device when he is ambulating and especially when going to the bathroom. He admits to knowing this information but felt he could get to the bathroom without it. Patient demonstrates increased freezing of gait patterns when he is attempting to ambulate without an assistive device and places him at a greater risk of falls. We discuss this again today and he reports he understands. Will continue to work towards improving balance to reduce risk of falls as well as reinforcing safety issue with device at home and in the community.    Occupational Profile and client history currently impacting functional performance  progressive disease process, freezing of gait behaviors, fall risk    Occupational performance deficits (Please refer to evaluation for details):  ADL's;IADL's;Leisure    Rehab Potential  Good    Current Impairments/barriers affecting progress:  positive: family support, negative: level of motivation, progressive disease    OT Frequency  2x / week    OT Duration  12 weeks    OT Treatment/Interventions  Self-care/ADL training;Moist Heat;DME and/or AE instruction;Balance training;Therapeutic activities;Therapeutic exercise;Neuromuscular education;Functional Mobility Training;Patient/family education    Consulted and Agree with Plan of Care  Patient       Patient will benefit  from skilled therapeutic intervention in order to improve the following deficits and impairments:  Abnormal gait, Decreased coordination, Decreased range of motion, Difficulty walking, Decreased endurance, Decreased safety awareness, Decreased activity tolerance, Decreased balance, Impaired UE functional use, Pain, Decreased mobility, Decreased strength  Visit Diagnosis: Unsteadiness on feet  Abnormal posture  Difficulty in walking, not elsewhere classified  Muscle weakness (generalized)  Other lack of coordination    Problem List Patient Active Problem List   Diagnosis Date Noted  . Dementia due to Parkinson's disease without behavioral disturbance ([place]) 07/18/2017  . Neurogenic orthostatic hypotension ([place]) 12/02/2016  . BPH with urinary obstruction 11/14/2016  . Urinary frequency 10/10/2016  . Microscopic hematuria 10/10/2016  . Parkinson's syndrome ([place]) 06/14/2016  . Arthritis 06/14/2016  . Depression 06/14/2016  . Heartburn 06/14/2016  . Urinary incontinence 06/14/2016  . Skin lesion 06/14/2016  . Dementia 06/14/2016  . Bilateral edema of lower extremity 06/14/2016   Achilles Dunk, OTR/L, CLT  Lovett,Amy 05/23/2018, 8:19 PM  [place] MAIN [facility] SERVICES [address] [place], [place], [address] Phone: [phone]   Fax:  [phone]  Name: Luke Durham MRN: [id number] Date of Birth: 04-Jan-1933

## 2018-05-23 NOTE — Telephone Encounter (Signed)
Pt informed, please schedule with Dr. Erlene Quan for cysto.

## 2018-05-23 NOTE — Telephone Encounter (Signed)
-----   Message from Nori Riis, PA-C sent at 05/23/2018  8:58 AM EDT ----- Please let Mrs. Gladman know that his renal ultrasound was normal.  We should get him scheduled for a cystoscopy at this time.

## 2018-05-24 NOTE — Telephone Encounter (Signed)
App scheduled Patient notified  Luke Durham

## 2018-05-25 ENCOUNTER — Ambulatory Visit: Payer: Medicare Other | Attending: Physician Assistant | Admitting: Occupational Therapy

## 2018-05-25 DIAGNOSIS — R278 Other lack of coordination: Secondary | ICD-10-CM | POA: Diagnosis present

## 2018-05-25 DIAGNOSIS — R262 Difficulty in walking, not elsewhere classified: Secondary | ICD-10-CM

## 2018-05-25 DIAGNOSIS — R293 Abnormal posture: Secondary | ICD-10-CM | POA: Diagnosis present

## 2018-05-25 DIAGNOSIS — R2681 Unsteadiness on feet: Secondary | ICD-10-CM | POA: Diagnosis not present

## 2018-05-25 DIAGNOSIS — M6281 Muscle weakness (generalized): Secondary | ICD-10-CM

## 2018-05-26 ENCOUNTER — Encounter: Payer: Self-pay | Admitting: Occupational Therapy

## 2018-05-26 NOTE — Therapy (Signed)
[place] MAIN [facility] SERVICES [address] [place], [place], [address] Phone: [phone]   Fax:  [phone]  Occupational Therapy Treatment  Patient Details  Name: Luke Durham MRN: [id number] Date of Birth: [birth year] No data recorded  Encounter Date: 05/25/2018  OT End of Session - 05/26/18 0937    Visit Number  128    Number of Visits  156    Date for OT Re-Evaluation  07/23/18    OT Start Time  1102    OT Stop Time  1200    OT Time Calculation (min)  58 min    Activity Tolerance  Patient tolerated treatment well    Behavior During Therapy  [facility] for tasks assessed/performed       Past Medical History:  Diagnosis Date  . Anxiety   . Arthritis   . Asthma    childhood asthma  . Cancer ([place]) 7 years ago   lymphoma, [place] (recent)  . Chronic kidney disease   . Colon polyps   . GERD (gastroesophageal reflux disease)   . History of kidney stones   . Parkinson disease [facility])     Past Surgical History:  Procedure Laterality Date  . COLON SURGERY    . CYSTOSCOPY WITH LITHOLAPAXY N/A 11/14/2016   Procedure: CYSTOSCOPY WITH LITHOLAPAXY;  Surgeon: Hollice Espy, MD;  Location: [facility] ORS;  Service: Urology;  Laterality: N/A;  . EYE SURGERY Bilateral    Cataract Extraction with IOL  . HERNIA REPAIR  20 years  . MOHS SURGERY    . SMALL INTESTINE SURGERY     Per patient 7 years  . TRANSURETHRAL RESECTION OF PROSTATE N/A 11/14/2016   Procedure: TRANSURETHRAL RESECTION OF THE PROSTATE (TURP);  Surgeon: Hollice Espy, MD;  Location: [facility] ORS;  Service: Urology;  Laterality: N/A;    There were no vitals filed for this visit.  Subjective Assessment - 05/26/18 0926    Subjective   Patient with no falls this week, patient denies any pain.      Pertinent History  Patient reports he was diagnosed with Parkinson's disease about 10 plus years ago.  He reports a more recent decline in function in his daily activities in the last 6-12 months.  He  has been seeing PT for the last couple months.    Patient Stated Goals  Patient reports he wants to be able to do more for himself and around the house.     Currently in Pain?  No/denies    Pain Score  0-No pain                   OT Treatments/Exercises (OP) - 05/26/18 0936      Neurological Re-education Exercises   Other Exercises 1  Patient seen for instruction of LSVT BIG exercises: LSVT Daily Session Maximal Daily Exercises: Sustained movements are designed to rescale the amplitude of movement output for generalization to daily functional activities. Performed as follows for 1 set of 10 repetitions each: Multi directional sustained movements- 1) Floor to ceiling, 2) Side to side. Multi directional Repetitive movements performed in standing and are designed to provide retraining effort needed for sustained muscle activation in tasks Performed as follows: 3) Step and reach forward, 4) Step and Reach Backwards, 5) Step and reach sideways, 6) Rock and reach forward/backward, 7) Rock and reach sideways.  CGA for exercises in standing for exercises , verbal and tactile cues to complete with correct form and arm placements.  Reciprocal stepping patterns  in standing with SBA and use of one handrail.      Other Exercises 2  Posture exercises standing at the wall with cues for shoulder retraction and head up, ball exercises for shoulder ROM for 2 sets of 12 reps. Manual stretch from therapist to open up pecs and improve posture which affects balance.  Patient performing active shoulder retraction with cues, 10 reps for 2 sets.                   OT Long Term Goals - 05/09/18 1443      OT LONG TERM GOAL #1   Title  Patient will demonstrate increase right hand grip to be able to cut food with modified independence.     Baseline  occasional assist required.    Time  12    Period  Weeks    Status  Partially Met      OT LONG TERM GOAL #2   Title  Patient will complete donning long  sleeve shirt with modified independence.    Baseline  requires assist with long sleeves, can perform short sleeves.    Time  12    Period  Weeks    Status  Achieved      OT LONG TERM GOAL #3   Title  Patient will improve coordination to manage buttons on clothing with occasional assistance only.    Baseline  requires assist with buttons each time    Time  12    Period  Weeks    Status  Partially Met      OT LONG TERM GOAL #4   Title  Patient will demonstrate upright posture with shoulders back and head up during exercises with minimal cues.      Baseline  moderate cues for positioning during exercises.     Time  12    Period  Weeks    Status  New    Target Date  07/23/18      OT LONG TERM GOAL #5   Title  Patient will demonstrate turning behaviors with rollator using large steps and keeping feet behind the rollator with all turns with occasional minimal cues.     Time  12    Period  Weeks    Status  On-going      OT LONG TERM GOAL #6   Title  Patient will improve gait speed and endurance and be able to walk 950 feet in 6 minutes to negotiate around the home and community safely in 4 weeks    Baseline  860 at reassessment, 034 at recert 7/8    Time  4    Period  Weeks    Status  On-going      OT LONG TERM GOAL #7   Title  Patient will complete HEP for maximal daily exercises with modified independence in 4 weeks    Time  12    Period  Weeks    Status  Partially Met      OT LONG TERM GOAL #8   Title  Patient will transfer from sit to stand without the use of arms safely and independently from a variety of chairs/surfaces in 4 weeks.     Baseline  difficulty from lower surfaces    Time  4    Period  Weeks    Status  Achieved      OT LONG TERM GOAL  #9   Baseline  Patient will demonstrate decreased episodes of freezing of behaviors  from score of 16 to score of less than 12.    Time  4    Period  Weeks    Status  Achieved            Plan - 05/26/18 0459     Clinical Impression Statement  Patient reports concerns about not being able to get out more to walk and feels like his wife already has a lot going on.  Recommend patient and wife go together to the mall near their house to walk a few times a week in a climate controlled environment and even surfaces. Patient with decreased freezing of gait this session compared to last 2 sessions.  Patient denies any changes at home with freezing.  Continued focus on amplitude of steps, initiation of gait and decreased freezing of gait patterns and triggers to reduce risk of falls.    Occupational Profile and client history currently impacting functional performance  progressive disease process, freezing of gait behaviors, fall risk    Occupational performance deficits (Please refer to evaluation for details):  ADL's;IADL's;Leisure    Rehab Potential  Good    Current Impairments/barriers affecting progress:  positive: family support, negative: level of motivation, progressive disease    OT Frequency  2x / week    OT Duration  12 weeks    OT Treatment/Interventions  Self-care/ADL training;Moist Heat;DME and/or AE instruction;Balance training;Therapeutic activities;Therapeutic exercise;Neuromuscular education;Functional Mobility Training;Patient/family education    Consulted and Agree with Plan of Care  Patient       Patient will benefit from skilled therapeutic intervention in order to improve the following deficits and impairments:  Abnormal gait, Decreased coordination, Decreased range of motion, Difficulty walking, Decreased endurance, Decreased safety awareness, Decreased activity tolerance, Decreased balance, Impaired UE functional use, Pain, Decreased mobility, Decreased strength  Visit Diagnosis: Unsteadiness on feet  Abnormal posture  Difficulty in walking, not elsewhere classified  Muscle weakness (generalized)  Other lack of coordination    Problem List Patient Active Problem List   Diagnosis  Date Noted  . Dementia due to Parkinson's disease without behavioral disturbance ([place]) 07/18/2017  . Neurogenic orthostatic hypotension ([place]) 12/02/2016  . BPH with urinary obstruction 11/14/2016  . Urinary frequency 10/10/2016  . Microscopic hematuria 10/10/2016  . Parkinson's syndrome ([place]) 06/14/2016  . Arthritis 06/14/2016  . Depression 06/14/2016  . Heartburn 06/14/2016  . Urinary incontinence 06/14/2016  . Skin lesion 06/14/2016  . Dementia 06/14/2016  . Bilateral edema of lower extremity 06/14/2016   Achilles Dunk, OTR/L, CLT  Lovett,Amy 05/26/2018, 9:42 AM  [place] MAIN [facility] SERVICES [address] [place], [place], [address] Phone: [phone]   Fax:  [phone]  Name: Luke Durham MRN: [id number] Date of Birth: [birth year]

## 2018-05-28 ENCOUNTER — Other Ambulatory Visit: Payer: Self-pay | Admitting: Family Medicine

## 2018-05-28 ENCOUNTER — Telehealth: Payer: Self-pay

## 2018-05-28 DIAGNOSIS — R310 Gross hematuria: Secondary | ICD-10-CM

## 2018-05-28 NOTE — Telephone Encounter (Signed)
-----   Message from Nori Riis, PA-C sent at 05/27/2018  3:43 PM EDT ----- If Mr. Vi is still have hematuria, we should check a Hgb and HCT as his cystoscopy in not scheduled until September.

## 2018-05-28 NOTE — Telephone Encounter (Signed)
Pt informed, coming in tomorrow 05/29/18 for labs

## 2018-05-29 ENCOUNTER — Ambulatory Visit: Payer: Medicare Other | Admitting: Occupational Therapy

## 2018-05-29 ENCOUNTER — Other Ambulatory Visit: Payer: Medicare Other

## 2018-05-29 DIAGNOSIS — M6281 Muscle weakness (generalized): Secondary | ICD-10-CM

## 2018-05-29 DIAGNOSIS — R293 Abnormal posture: Secondary | ICD-10-CM

## 2018-05-29 DIAGNOSIS — R2681 Unsteadiness on feet: Secondary | ICD-10-CM | POA: Diagnosis not present

## 2018-05-29 DIAGNOSIS — R278 Other lack of coordination: Secondary | ICD-10-CM

## 2018-05-29 DIAGNOSIS — R262 Difficulty in walking, not elsewhere classified: Secondary | ICD-10-CM

## 2018-05-29 DIAGNOSIS — R310 Gross hematuria: Secondary | ICD-10-CM

## 2018-05-30 ENCOUNTER — Telehealth: Payer: Self-pay

## 2018-05-30 LAB — HEMATOCRIT: Hematocrit: 34.7 % — ABNORMAL LOW (ref 37.5–51.0)

## 2018-05-30 LAB — HEMOGLOBIN: Hemoglobin: 11.7 g/dL — ABNORMAL LOW (ref 13.0–17.7)

## 2018-05-30 NOTE — Telephone Encounter (Signed)
-----   Message from Nori Riis, PA-C sent at 05/30/2018  9:27 AM EDT ----- Hgb and HCT are stable.  Is he still having blood in his urine?

## 2018-05-30 NOTE — Telephone Encounter (Signed)
Pt states there has been small amounts of blood.

## 2018-06-05 ENCOUNTER — Ambulatory Visit: Payer: Medicare Other | Admitting: Occupational Therapy

## 2018-06-05 ENCOUNTER — Encounter: Payer: Self-pay | Admitting: Occupational Therapy

## 2018-06-05 DIAGNOSIS — R293 Abnormal posture: Secondary | ICD-10-CM

## 2018-06-05 DIAGNOSIS — R262 Difficulty in walking, not elsewhere classified: Secondary | ICD-10-CM

## 2018-06-05 DIAGNOSIS — R2681 Unsteadiness on feet: Secondary | ICD-10-CM | POA: Diagnosis not present

## 2018-06-05 DIAGNOSIS — M6281 Muscle weakness (generalized): Secondary | ICD-10-CM

## 2018-06-05 DIAGNOSIS — R278 Other lack of coordination: Secondary | ICD-10-CM

## 2018-06-05 NOTE — Therapy (Signed)
[place] MAIN [facility] SERVICES [address] [place], [place], [address] Phone: [phone]   Fax:  [phone]  Occupational Therapy Treatment  Patient Details  Name: Luke Durham MRN: [id number] Date of Birth: May 13, 1933 No data recorded  Encounter Date: 05/29/2018  OT End of Session - 06/05/18 1138    Visit Number  129    Number of Visits  156    Date for OT Re-Evaluation  07/23/18    Authorization Type  Medicare visit 9 of 10    OT Start Time  1301    OT Stop Time  1400    OT Time Calculation (min)  59 min    Activity Tolerance  Patient tolerated treatment well    Behavior During Therapy  [facility] for tasks assessed/performed       Past Medical History:  Diagnosis Date  . Anxiety   . Arthritis   . Asthma    childhood asthma  . Cancer ([place]) 7 years ago   lymphoma, [place] (recent)  . Chronic kidney disease   . Colon polyps   . GERD (gastroesophageal reflux disease)   . History of kidney stones   . Parkinson disease [facility])     Past Surgical History:  Procedure Laterality Date  . COLON SURGERY    . CYSTOSCOPY WITH LITHOLAPAXY N/A 11/14/2016   Procedure: CYSTOSCOPY WITH LITHOLAPAXY;  Surgeon: Hollice Espy, MD;  Location: [facility] ORS;  Service: Urology;  Laterality: N/A;  . EYE SURGERY Bilateral    Cataract Extraction with IOL  . HERNIA REPAIR  20 years  . MOHS SURGERY    . SMALL INTESTINE SURGERY     Per patient 7 years  . TRANSURETHRAL RESECTION OF PROSTATE N/A 11/14/2016   Procedure: TRANSURETHRAL RESECTION OF THE PROSTATE (TURP);  Surgeon: Hollice Espy, MD;  Location: [facility] ORS;  Service: Urology;  Laterality: N/A;    There were no vitals filed for this visit.  Subjective Assessment - 06/05/18 1137    Subjective   Pt reports he is doing well, would like to work on some balance exercises today different than the ones he has been doing.     Pertinent History  Patient reports he was diagnosed with Parkinson's disease about 10 plus  years ago.  He reports a more recent decline in function in his daily activities in the last 6-12 months.  He has been seeing PT for the last couple months.    Patient Stated Goals  Patient reports he wants to be able to do more for himself and around the house.     Currently in Pain?  No/denies    Pain Score  0-No pain         Patient seen for balance activities in standing with right and left foot placements on command for stepping patterns to the right, left, forwards and back. CGA to complete with occasional min assist for balance recovery. Patient did not show any evidence of freezing of gait during this task even with timing was quick versus slow. Alternating step patterns, right and left at stair case on first step with cues, CGA and use of one handrail. Shoulder flexion exercises in standing with ball for flexion and maintaining overhead for up to 10 secs. Cues for posture. Emphasis on size of stepping patterns this date along with reciprocal movements             OT Treatments/Exercises (OP) - 06/05/18 1140      Neurological Re-education Exercises  Other Exercises 1  Patient seen for instruction of LSVT BIG exercises: LSVT Daily Session Maximal Daily Exercises: Sustained movements are designed to rescale the amplitude of movement output for generalization to daily functional activities. Performed as follows for 1 set of 10 repetitions each: Multi directional sustained movements- 1) Floor to ceiling, 2) Side to side. Multi directional Repetitive movements performed in standing and are designed to provide retraining effort needed for sustained muscle activation in tasks Performed as follows: 3) Step and reach forward, 4) Step and Reach Backwards, 5) Step and reach sideways, 6) Rock and reach forward/backward, 7) Rock and reach sideways.  CGA for exercises in standing for exercises , verbal and tactile cues to complete with correct form and arm placements.              OT  Education - 06/05/18 1138    Education provided  Yes    Education Details  balance, use of rollator    Person(s) Educated  Patient    Methods  Demonstration;Explanation;Verbal cues;Tactile cues    Comprehension  Verbal cues required;Returned demonstration;Verbalized understanding;Tactile cues required          OT Long Term Goals - 05/09/18 1443      OT LONG TERM GOAL #1   Title  Patient will demonstrate increase right hand grip to be able to cut food with modified independence.     Baseline  occasional assist required.    Time  12    Period  Weeks    Status  Partially Met      OT LONG TERM GOAL #2   Title  Patient will complete donning long sleeve shirt with modified independence.    Baseline  requires assist with long sleeves, can perform short sleeves.    Time  12    Period  Weeks    Status  Achieved      OT LONG TERM GOAL #3   Title  Patient will improve coordination to manage buttons on clothing with occasional assistance only.    Baseline  requires assist with buttons each time    Time  12    Period  Weeks    Status  Partially Met      OT LONG TERM GOAL #4   Title  Patient will demonstrate upright posture with shoulders back and head up during exercises with minimal cues.      Baseline  moderate cues for positioning during exercises.     Time  12    Period  Weeks    Status  New    Target Date  07/23/18      OT LONG TERM GOAL #5   Title  Patient will demonstrate turning behaviors with rollator using large steps and keeping feet behind the rollator with all turns with occasional minimal cues.     Time  12    Period  Weeks    Status  On-going      OT LONG TERM GOAL #6   Title  Patient will improve gait speed and endurance and be able to walk 950 feet in 6 minutes to negotiate around the home and community safely in 4 weeks    Baseline  860 at reassessment, 098 at recert 7/8    Time  4    Period  Weeks    Status  On-going      OT LONG TERM GOAL #7   Title   Patient will complete HEP for maximal daily exercises with modified independence  in 4 weeks    Time  12    Period  Weeks    Status  Partially Met      OT LONG TERM GOAL #8   Title  Patient will transfer from sit to stand without the use of arms safely and independently from a variety of chairs/surfaces in 4 weeks.     Baseline  difficulty from lower surfaces    Time  4    Period  Weeks    Status  Achieved      OT LONG TERM GOAL  #9   Baseline  Patient will demonstrate decreased episodes of freezing of behaviors from score of 16 to score of less than 12.    Time  4    Period  Weeks    Status  Achieved            Plan - 06/05/18 1139    Clinical Impression Statement  Pt feels he needs a break from his daily exercises. Discussed the need to continue with maximal daily exercises either adapted version at home of standard version in therapy with therapist assist. He reports understanding the science and value of repetition. We did alternative balance activities this date and patient was engaged and pleased. Continue to work towards goals to increase independence in necessary daily activities.     Occupational Profile and client history currently impacting functional performance  progressive disease process, freezing of gait behaviors, fall risk    Occupational performance deficits (Please refer to evaluation for details):  ADL's;IADL's;Leisure    Rehab Potential  Good    Current Impairments/barriers affecting progress:  positive: family support, negative: level of motivation, progressive disease    OT Frequency  2x / week    OT Duration  12 weeks    OT Treatment/Interventions  Self-care/ADL training;Moist Heat;DME and/or AE instruction;Balance training;Therapeutic activities;Therapeutic exercise;Neuromuscular education;Functional Mobility Training;Patient/family education    Consulted and Agree with Plan of Care  Patient       Patient will benefit from skilled therapeutic  intervention in order to improve the following deficits and impairments:  Abnormal gait, Decreased coordination, Decreased range of motion, Difficulty walking, Decreased endurance, Decreased safety awareness, Decreased activity tolerance, Decreased balance, Impaired UE functional use, Pain, Decreased mobility, Decreased strength  Visit Diagnosis: Unsteadiness on feet  Abnormal posture  Difficulty in walking, not elsewhere classified  Muscle weakness (generalized)  Other lack of coordination    Problem List Patient Active Problem List   Diagnosis Date Noted  . Dementia due to Parkinson's disease without behavioral disturbance ([place]) 07/18/2017  . Neurogenic orthostatic hypotension ([place]) 12/02/2016  . BPH with urinary obstruction 11/14/2016  . Urinary frequency 10/10/2016  . Microscopic hematuria 10/10/2016  . Parkinson's syndrome ([place]) 06/14/2016  . Arthritis 06/14/2016  . Depression 06/14/2016  . Heartburn 06/14/2016  . Urinary incontinence 06/14/2016  . Skin lesion 06/14/2016  . Dementia 06/14/2016  . Bilateral edema of lower extremity 06/14/2016   Achilles Dunk, OTR/L, CLT  , 06/05/2018, 11:41 AM  [place] MAIN [facility] SERVICES [address] [place], [place], [address] Phone: [phone]   Fax:  [phone]  Name: Treyton Slimp MRN: [id number] Date of Birth: 09-Jun-1933

## 2018-06-08 ENCOUNTER — Ambulatory Visit: Payer: Medicare Other | Admitting: Occupational Therapy

## 2018-06-08 VITALS — BP 96/57

## 2018-06-08 DIAGNOSIS — R293 Abnormal posture: Secondary | ICD-10-CM

## 2018-06-08 DIAGNOSIS — R278 Other lack of coordination: Secondary | ICD-10-CM

## 2018-06-08 DIAGNOSIS — R262 Difficulty in walking, not elsewhere classified: Secondary | ICD-10-CM

## 2018-06-08 DIAGNOSIS — R2681 Unsteadiness on feet: Secondary | ICD-10-CM

## 2018-06-08 DIAGNOSIS — M6281 Muscle weakness (generalized): Secondary | ICD-10-CM

## 2018-06-09 ENCOUNTER — Encounter: Payer: Self-pay | Admitting: Occupational Therapy

## 2018-06-09 NOTE — Therapy (Signed)
[place] MAIN [facility] SERVICES [address] [place], [place], [address] Phone: [phone]   Fax:  [phone]  Occupational Therapy Treatment  Patient Details  Name: Luke Durham MRN: [id number] Date of Birth: 1933-05-23 No data recorded  Encounter Date: 06/08/2018  OT End of Session - 06/09/18 1511    Visit Number  131    Number of Visits  156    Date for OT Re-Evaluation  07/23/18    Authorization Type  Medicare visit 1 of 10    OT Start Time  1101    OT Stop Time  1158    OT Time Calculation (min)  57 min    Activity Tolerance  Patient tolerated treatment well    Behavior During Therapy  [facility] for tasks assessed/performed       Past Medical History:  Diagnosis Date  . Anxiety   . Arthritis   . Asthma    childhood asthma  . Cancer ([place]) 7 years ago   lymphoma, [place] (recent)  . Chronic kidney disease   . Colon polyps   . GERD (gastroesophageal reflux disease)   . History of kidney stones   . Parkinson disease [facility])     Past Surgical History:  Procedure Laterality Date  . COLON SURGERY    . CYSTOSCOPY WITH LITHOLAPAXY N/A 11/14/2016   Procedure: CYSTOSCOPY WITH LITHOLAPAXY;  Surgeon: Hollice Espy, MD;  Location: [facility] ORS;  Service: Urology;  Laterality: N/A;  . EYE SURGERY Bilateral    Cataract Extraction with IOL  . HERNIA REPAIR  20 years  . MOHS SURGERY    . SMALL INTESTINE SURGERY     Per patient 7 years  . TRANSURETHRAL RESECTION OF PROSTATE N/A 11/14/2016   Procedure: TRANSURETHRAL RESECTION OF THE PROSTATE (TURP);  Surgeon: Hollice Espy, MD;  Location: [facility] ORS;  Service: Urology;  Laterality: N/A;    Vitals:   06/09/18 1509  BP: (!) 96/57    Subjective Assessment - 06/09/18 1509    Subjective   Patient reports he may go out for lunch tomorrow with daughter and wife.  reports he would like to work on posture this date.    Pertinent History  Patient reports he was diagnosed with Parkinson's disease about 10  plus years ago.  He reports a more recent decline in function in his daily activities in the last 6-12 months.  He has been seeing PT for the last couple months.    Patient Stated Goals  Patient reports he wants to be able to do more for himself and around the house.     Currently in Pain?  No/denies    Pain Score  0-No pain                   OT Treatments/Exercises (OP) - 06/09/18 1512      Neurological Re-education Exercises   Other Exercises 1  Patient seen for instruction of LSVT BIG exercises: LSVT Daily Session Maximal Daily Exercises: Sustained movements are designed to rescale the amplitude of movement output for generalization to daily functional activities. Performed as follows for 1 set of 10 repetitions each: Multi directional sustained movements- 1) Floor to ceiling, 2) Side to side. Multi directional Repetitive movements performed in standing and are designed to provide retraining effort needed for sustained muscle activation in tasks Performed as follows: 3) Step and reach forward, 4) Step and Reach Backwards, 5) Step and reach sideways, 6) Rock and reach forward/backward, 7) Rock and reach  sideways.  CGA for exercises in standing for exercises , verbal and tactile cues to complete with correct form and arm   placements.  Postural exercises with manual stretch by therapist to pectoralis muscles followed by AROM with cues in sitting and standing.  Alternating stepping patterns in standing in a variety of directions with verbal instructions and CGA    Other Exercises 2  Balance pad exercises in standing on blue foam pad while performing bilateral UE shoulder ROM with ball raises, chest press, and side to side pass, CGA and use of bars as needed.  Functional mobility with rollator for 2 trials of 400 feet with CGA to SBA with use of rollator.              OT Education - 06/09/18 1511    Education provided  Yes    Education Details  posture, balance exercises on balance  pad    Person(s) Educated  Patient    Methods  Demonstration;Explanation;Verbal cues;Tactile cues    Comprehension  Verbal cues required;Returned demonstration;Verbalized understanding;Tactile cues required          OT Long Term Goals - 06/09/18 1504      OT LONG TERM GOAL #1   Title  Patient will demonstrate increase right hand grip to be able to cut food with modified independence.     Baseline  occasional assist required.    Time  12    Period  Weeks    Status  Partially Met      OT LONG TERM GOAL #2   Title  Patient will complete donning long sleeve shirt with modified independence.    Baseline  requires assist with long sleeves, can perform short sleeves.    Time  12    Period  Weeks    Status  Achieved      OT LONG TERM GOAL #3   Title  Patient will improve coordination to manage buttons on clothing with occasional assistance only.    Baseline  requires assist with buttons each time    Time  12    Period  Weeks    Status  Partially Met      OT LONG TERM GOAL #4   Title  Patient will demonstrate upright posture with shoulders back and head up during exercises with minimal cues.      Baseline  moderate cues for positioning during exercises.     Time  12    Period  Weeks    Status  New      OT LONG TERM GOAL #5   Title  Patient will demonstrate turning behaviors with rollator using large steps and keeping feet behind the rollator with all turns with occasional minimal cues.     Time  12    Period  Weeks    Status  On-going      OT LONG TERM GOAL #6   Title  Patient will improve gait speed and endurance and be able to walk 950 feet in 6 minutes to negotiate around the home and community safely in 4 weeks    Baseline  860 at reassessment, 885 at recert 7/8    Time  4    Period  Weeks    Status  On-going      OT LONG TERM GOAL #7   Title  Patient will complete HEP for maximal daily exercises with modified independence in 4 weeks    Time  12    Period  Weeks  Status  Partially Met      OT LONG TERM GOAL #8   Title  Patient will transfer from sit to stand without the use of arms safely and independently from a variety of chairs/surfaces in 4 weeks.     Baseline  difficulty from lower surfaces    Time  4    Period  Weeks    Status  Achieved      OT LONG TERM GOAL  #9   Baseline  Patient will demonstrate decreased episodes of freezing of behaviors from score of 16 to score of less than 12.    Time  4    Period  Weeks    Status  Achieved            Plan - 06/09/18 1517    Clinical Impression Statement  Patient seen for balance tasks today in standing with use of balance pad in parallel bars.  Occasionally requires use of one hand to hold the bar when performing stepping patterns.  Patient continues to require increased cues for posture during exercises, sitting , standing and during functional mobility     Occupational Profile and client history currently impacting functional performance  progressive disease process, freezing of gait behaviors, fall risk    Occupational performance deficits (Please refer to evaluation for details):  ADL's;IADL's;Leisure    Rehab Potential  Good    Current Impairments/barriers affecting progress:  positive: family support, negative: level of motivation, progressive disease    OT Frequency  2x / week    OT Duration  12 weeks    OT Treatment/Interventions  Self-care/ADL training;Moist Heat;DME and/or AE instruction;Balance training;Therapeutic activities;Therapeutic exercise;Neuromuscular education;Functional Mobility Training;Patient/family education    Consulted and Agree with Plan of Care  Patient       Patient will benefit from skilled therapeutic intervention in order to improve the following deficits and impairments:  Abnormal gait, Decreased coordination, Decreased range of motion, Difficulty walking, Decreased endurance, Decreased safety awareness, Decreased activity tolerance, Decreased balance,  Impaired UE functional use, Pain, Decreased mobility, Decreased strength  Visit Diagnosis: Unsteadiness on feet  Abnormal posture  Difficulty in walking, not elsewhere classified  Muscle weakness (generalized)  Other lack of coordination    Problem List Patient Active Problem List   Diagnosis Date Noted  . Dementia due to Parkinson's disease without behavioral disturbance ([place]) 07/18/2017  . Neurogenic orthostatic hypotension ([place]) 12/02/2016  . BPH with urinary obstruction 11/14/2016  . Urinary frequency 10/10/2016  . Microscopic hematuria 10/10/2016  . Parkinson's syndrome ([place]) 06/14/2016  . Arthritis 06/14/2016  . Depression 06/14/2016  . Heartburn 06/14/2016  . Urinary incontinence 06/14/2016  . Skin lesion 06/14/2016  . Dementia 06/14/2016  . Bilateral edema of lower extremity 06/14/2016   Achilles Dunk, OTR/L, CLT  Luke Durham,Luke Durham 06/09/2018, 3:20 PM  [place] MAIN [facility] SERVICES [address] [place], [place], [address] Phone: [phone]   Fax:  [phone]  Name: Luke Durham MRN: [id number] Date of Birth: 01-23-1933

## 2018-06-09 NOTE — Therapy (Signed)
[place] MAIN [facility] SERVICES [address] [place], [place], [address] Phone: [phone]   Fax:  [phone]  Occupational Therapy Treatment/Progress update Reporting period from 04/27/2018 to 06/05/2018   Patient Details  Name: Luke Durham MRN: [id number] Date of Birth: [birth year] No data recorded  Encounter Date: 06/05/2018  OT End of Session - 06/09/18 1458    Visit Number  130    Number of Visits  156    Date for OT Re-Evaluation  07/23/18    Authorization Type  Medicare visit 10 of 10    OT Start Time  1330    OT Stop Time  1429    OT Time Calculation (min)  59 min    Activity Tolerance  Patient tolerated treatment well    Behavior During Therapy  [facility] for tasks assessed/performed       Past Medical History:  Diagnosis Date  . Anxiety   . Arthritis   . Asthma    childhood asthma  . Cancer ([place]) 7 years ago   lymphoma, [place] (recent)  . Chronic kidney disease   . Colon polyps   . GERD (gastroesophageal reflux disease)   . History of kidney stones   . Parkinson disease Luke Durham)     Past Surgical History:  Procedure Laterality Date  . COLON SURGERY    . CYSTOSCOPY WITH LITHOLAPAXY N/A 11/14/2016   Procedure: CYSTOSCOPY WITH LITHOLAPAXY;  Surgeon: Hollice Espy, MD;  Location: [facility] ORS;  Service: Urology;  Laterality: N/A;  . EYE SURGERY Bilateral    Cataract Extraction with IOL  . HERNIA REPAIR  20 years  . MOHS SURGERY    . SMALL INTESTINE SURGERY     Per patient 7 years  . TRANSURETHRAL RESECTION OF PROSTATE N/A 11/14/2016   Procedure: TRANSURETHRAL RESECTION OF THE PROSTATE (TURP);  Surgeon: Hollice Espy, MD;  Location: [facility] ORS;  Service: Urology;  Laterality: N/A;    There were no vitals filed for this visit.  Subjective Assessment - 06/09/18 1457    Subjective   Patient denies any pain or falls since last session.  Patient undergoing treatment for scalp and wearing a hat this week.     Pertinent History   Patient reports he was diagnosed with Parkinson's disease about 10 plus years ago.  He reports a more recent decline in function in his daily activities in the last 6-12 months.  He has been seeing PT for the last couple months.    Patient Stated Goals  Patient reports he wants to be able to do more for himself and around the house.     Currently in Pain?  No/denies    Pain Score  0-No pain                   OT Treatments/Exercises (OP) - 06/09/18 1505      Neurological Re-education Exercises   Other Exercises 1  Patient seen for instruction of LSVT BIG exercises: LSVT Daily Session Maximal Daily Exercises: Sustained movements are designed to rescale the amplitude of movement output for generalization to daily functional activities. Performed as follows for 1 set of 10 repetitions each: Multi directional sustained movements- 1) Floor to ceiling, 2) Side to side. Multi directional Repetitive movements performed in standing and are designed to provide retraining effort needed for sustained muscle activation in tasks Performed as follows: 3) Step and reach forward, 4) Step and Reach Backwards, 5) Step and reach sideways, 6) Rock and reach forward/backward, 7)  Rock and reach sideways.  CGA for exercises in standing for exercises , verbal and tactile cues to complete with correct form and arm placements.     Other Exercises 2  6 minute walk test 895 feet, 5 times sit to stand 14 secs, turning behaviors in small spaces with rollator, cues and CGA to SBA.             OT Education - 06/09/18 1458    Education provided  Yes    Education Details  turns, freezing of gait    Person(s) Educated  Patient    Methods  Demonstration;Explanation;Verbal cues;Tactile cues    Comprehension  Verbal cues required;Returned demonstration;Verbalized understanding;Tactile cues required          OT Long Term Goals - 06/09/18 1504      OT LONG TERM GOAL #1   Title  Patient will demonstrate  increase right hand grip to be able to cut food with modified independence.     Baseline  occasional assist required.    Time  12    Period  Weeks    Status  Partially Met      OT LONG TERM GOAL #2   Title  Patient will complete donning long sleeve shirt with modified independence.    Baseline  requires assist with long sleeves, can perform short sleeves.    Time  12    Period  Weeks    Status  Achieved      OT LONG TERM GOAL #3   Title  Patient will improve coordination to manage buttons on clothing with occasional assistance only.    Baseline  requires assist with buttons each time    Time  12    Period  Weeks    Status  Partially Met      OT LONG TERM GOAL #4   Title  Patient will demonstrate upright posture with shoulders back and head up during exercises with minimal cues.      Baseline  moderate cues for positioning during exercises.     Time  12    Period  Weeks    Status  New      OT LONG TERM GOAL #5   Title  Patient will demonstrate turning behaviors with rollator using large steps and keeping feet behind the rollator with all turns with occasional minimal cues.     Time  12    Period  Weeks    Status  On-going      OT LONG TERM GOAL #6   Title  Patient will improve gait speed and endurance and be able to walk 950 feet in 6 minutes to negotiate around the home and community safely in 4 weeks    Baseline  860 at reassessment, 583 at recert 7/8    Time  4    Period  Weeks    Status  On-going      OT LONG TERM GOAL #7   Title  Patient will complete HEP for maximal daily exercises with modified independence in 4 weeks    Time  12    Period  Weeks    Status  Partially Met      OT LONG TERM GOAL #8   Title  Patient will transfer from sit to stand without the use of arms safely and independently from a variety of chairs/surfaces in 4 weeks.     Baseline  difficulty from lower surfaces    Time  4  Period  Weeks    Status  Achieved      OT LONG TERM GOAL  #9    Baseline  Patient will demonstrate decreased episodes of freezing of behaviors from score of 16 to score of less than 12.    Time  4    Period  Weeks    Status  Achieved            Plan - 06/09/18 1459    Clinical Impression Statement  Progress update this session, 6 minute walk test 895 feet with rollator, 5 times sit to stand 14 secs.  Patient continues to demonstrate freezing of gait at times, usually when patient is under pressure for a timed test such as the 6 minute walk, in a hurry/rushed and in crowded spaces.  During 6 minute walk test, some freezing/hesitation noted only upon turns.  Focused on turns this date in small spaces, both right and left turns with cues for proper position of self in relation to rollator.  He continues to progress with exercises and reports doing exercises at home in adapted version.  Patient requesting to add some other balance activities in the next few sessions.      Occupational Profile and client history currently impacting functional performance  progressive disease process, freezing of gait behaviors, fall risk    Occupational performance deficits (Please refer to evaluation for details):  ADL's;IADL's;Leisure    Rehab Potential  Good    Current Impairments/barriers affecting progress:  positive: family support, negative: level of motivation, progressive disease    OT Frequency  2x / week    OT Duration  12 weeks    OT Treatment/Interventions  Self-care/ADL training;Moist Heat;DME and/or AE instruction;Balance training;Therapeutic activities;Therapeutic exercise;Neuromuscular education;Functional Mobility Training;Patient/family education    Consulted and Agree with Plan of Care  Patient       Patient will benefit from skilled therapeutic intervention in order to improve the following deficits and impairments:  Abnormal gait, Decreased coordination, Decreased range of motion, Difficulty walking, Decreased endurance, Decreased safety awareness,  Decreased activity tolerance, Decreased balance, Impaired UE functional use, Pain, Decreased mobility, Decreased strength  Visit Diagnosis: Unsteadiness on feet  Abnormal posture  Difficulty in walking, not elsewhere classified  Muscle weakness (generalized)  Other lack of coordination    Problem List Patient Active Problem List   Diagnosis Date Noted  . Dementia due to Parkinson's disease without behavioral disturbance ([place]) 07/18/2017  . Neurogenic orthostatic hypotension ([place]) 12/02/2016  . BPH with urinary obstruction 11/14/2016  . Urinary frequency 10/10/2016  . Microscopic hematuria 10/10/2016  . Parkinson's syndrome ([place]) 06/14/2016  . Arthritis 06/14/2016  . Depression 06/14/2016  . Heartburn 06/14/2016  . Urinary incontinence 06/14/2016  . Skin lesion 06/14/2016  . Dementia 06/14/2016  . Bilateral edema of lower extremity 06/14/2016   Achilles Dunk, OTR/L, CLT  Luke Durham 06/09/2018, 3:07 PM  [place] MAIN [facility] SERVICES [address] [place], [place], [address] Phone: [phone]   Fax:  [phone]  Name: Luke Durham MRN: [id number] Date of Birth: [birth year]

## 2018-06-11 ENCOUNTER — Ambulatory Visit: Payer: Medicare Other | Admitting: Occupational Therapy

## 2018-06-11 DIAGNOSIS — R262 Difficulty in walking, not elsewhere classified: Secondary | ICD-10-CM

## 2018-06-11 DIAGNOSIS — R2681 Unsteadiness on feet: Secondary | ICD-10-CM | POA: Diagnosis not present

## 2018-06-11 DIAGNOSIS — R278 Other lack of coordination: Secondary | ICD-10-CM

## 2018-06-11 DIAGNOSIS — M6281 Muscle weakness (generalized): Secondary | ICD-10-CM

## 2018-06-11 DIAGNOSIS — R293 Abnormal posture: Secondary | ICD-10-CM

## 2018-06-12 ENCOUNTER — Encounter: Payer: Medicare Other | Admitting: Occupational Therapy

## 2018-06-13 ENCOUNTER — Other Ambulatory Visit: Payer: Self-pay | Admitting: Physician Assistant

## 2018-06-13 DIAGNOSIS — F32A Depression, unspecified: Secondary | ICD-10-CM

## 2018-06-13 DIAGNOSIS — F329 Major depressive disorder, single episode, unspecified: Secondary | ICD-10-CM

## 2018-06-15 ENCOUNTER — Ambulatory Visit: Payer: Medicare Other | Admitting: Occupational Therapy

## 2018-06-15 DIAGNOSIS — R2681 Unsteadiness on feet: Secondary | ICD-10-CM

## 2018-06-15 DIAGNOSIS — R278 Other lack of coordination: Secondary | ICD-10-CM

## 2018-06-15 DIAGNOSIS — R293 Abnormal posture: Secondary | ICD-10-CM

## 2018-06-15 DIAGNOSIS — M6281 Muscle weakness (generalized): Secondary | ICD-10-CM

## 2018-06-15 DIAGNOSIS — R262 Difficulty in walking, not elsewhere classified: Secondary | ICD-10-CM

## 2018-06-19 ENCOUNTER — Ambulatory Visit: Payer: Medicare Other | Admitting: Occupational Therapy

## 2018-06-19 DIAGNOSIS — R262 Difficulty in walking, not elsewhere classified: Secondary | ICD-10-CM

## 2018-06-19 DIAGNOSIS — R293 Abnormal posture: Secondary | ICD-10-CM

## 2018-06-19 DIAGNOSIS — R278 Other lack of coordination: Secondary | ICD-10-CM

## 2018-06-19 DIAGNOSIS — R2681 Unsteadiness on feet: Secondary | ICD-10-CM

## 2018-06-19 DIAGNOSIS — M6281 Muscle weakness (generalized): Secondary | ICD-10-CM

## 2018-06-22 ENCOUNTER — Ambulatory Visit: Payer: Medicare Other | Admitting: Occupational Therapy

## 2018-06-22 DIAGNOSIS — M6281 Muscle weakness (generalized): Secondary | ICD-10-CM

## 2018-06-22 DIAGNOSIS — R278 Other lack of coordination: Secondary | ICD-10-CM

## 2018-06-22 DIAGNOSIS — R293 Abnormal posture: Secondary | ICD-10-CM

## 2018-06-22 DIAGNOSIS — R2681 Unsteadiness on feet: Secondary | ICD-10-CM

## 2018-06-22 DIAGNOSIS — R262 Difficulty in walking, not elsewhere classified: Secondary | ICD-10-CM

## 2018-06-23 ENCOUNTER — Encounter: Payer: Self-pay | Admitting: Occupational Therapy

## 2018-06-23 NOTE — Therapy (Addendum)
[place] MAIN [facility] SERVICES [address] [place], [place], [address] Phone: [phone]   Fax:  [phone]  Occupational Therapy Treatment  Patient Details  Name: Luke Durham MRN: [id number] Date of Birth: 12-26-1932 No data recorded  Encounter Date: 06/15/2018  OT End of Session - 06/23/18 2025    Visit Number  [id number]    Number of Visits  156    Date for OT Re-Evaluation  07/23/18    Authorization Type  Medicare visit 3 of 10    OT Start Time  1102    OT Stop Time  1200    OT Time Calculation (min)  58 min    Activity Tolerance  Patient tolerated treatment well    Behavior During Therapy  [facility] for tasks assessed/performed       Past Medical History:  Diagnosis Date  . Anxiety   . Arthritis   . Asthma    childhood asthma  . Cancer ([place]) 7 years ago   lymphoma, [place] (recent)  . Chronic kidney disease   . Colon polyps   . GERD (gastroesophageal reflux disease)   . History of kidney stones   . Parkinson disease [facility])     Past Surgical History:  Procedure Laterality Date  . COLON SURGERY    . CYSTOSCOPY WITH LITHOLAPAXY N/A 11/14/2016   Procedure: CYSTOSCOPY WITH LITHOLAPAXY;  Surgeon: Hollice Espy, MD;  Location: [facility] ORS;  Service: Urology;  Laterality: N/A;  . EYE SURGERY Bilateral    Cataract Extraction with IOL  . HERNIA REPAIR  20 years  . MOHS SURGERY    . SMALL INTESTINE SURGERY     Per patient 7 years  . TRANSURETHRAL RESECTION OF PROSTATE N/A 11/14/2016   Procedure: TRANSURETHRAL RESECTION OF THE PROSTATE (TURP);  Surgeon: Hollice Espy, MD;  Location: [facility] ORS;  Service: Urology;  Laterality: N/A;    There were no vitals filed for this visit.  Subjective Assessment - 06/23/18 2024    Subjective   Patient reports he has had some increase in freezing this week, no changes with medications    Pertinent History  Patient reports he was diagnosed with Parkinson's disease about 10 plus years ago.  He reports a more  recent decline in function in his daily activities in the last 6-12 months.  He has been seeing PT for the last couple months.    Patient Stated Goals  Patient reports he wants to be able to do more for himself and around the house.     Currently in Pain?  No/denies    Pain Score  0-No pain                   OT Treatments/Exercises (OP) - 06/23/18 2025      Neurological Re-education Exercises   Other Exercises 1  Patient seen for instruction of LSVT BIG exercises: LSVT Daily Session Maximal Daily Exercises: Sustained movements are designed to rescale the amplitude of movement output for generalization to daily functional activities. Performed as follows for 1 set of 10 repetitions each: Multi directional sustained movements- 1) Floor to ceiling, 2) Side to side. Multi directional Repetitive movements performed in standing and are designed to provide retraining effort needed for sustained muscle activation in tasks Performed as follows: 3) Step and reach forward, 4) Step and Reach Backwards, 5) Step and reach sideways, 6) Rock and reach forward/backward, 7) Rock and reach sideways.  CGA for exercises in standing for exercises , verbal and  tactile cues to complete with correct form and arm   placements.  Postural exercises with manual stretch by therapist to pectoralis muscles followed by AROM with cues in sitting and standing.  Alternating stepping patterns in standing in a variety of directions with verbal instructions and CGA       Patient asking this date to review strategies from last session in regards to GO words and strategies for hesitations and freezing.  Continued to work on these areas, Especially with body positioning with rollator (patient tends to stop, feet stay in one spot while rollator advances forwards).  Patient to pull rollator back to him rather than  Try to step.  Also worked on taking a step backwards when frozen to get movement patterns going, this works well as  long and patient's rollator is close and weight is not shifted forwards  Onto toes.       OT Education - 06/23/18 2025    Education provided  Yes    Education Details  HEP    Person(s) Educated  Patient    Methods  Demonstration;Explanation;Verbal cues;Tactile cues    Comprehension  Verbal cues required;Returned demonstration;Verbalized understanding;Tactile cues required          OT Long Term Goals - 06/09/18 1504      OT LONG TERM GOAL #1   Title  Patient will demonstrate increase right hand grip to be able to cut food with modified independence.     Baseline  occasional assist required.    Time  12    Period  Weeks    Status  Partially Met      OT LONG TERM GOAL #2   Title  Patient will complete donning long sleeve shirt with modified independence.    Baseline  requires assist with long sleeves, can perform short sleeves.    Time  12    Period  Weeks    Status  Achieved      OT LONG TERM GOAL #3   Title  Patient will improve coordination to manage buttons on clothing with occasional assistance only.    Baseline  requires assist with buttons each time    Time  12    Period  Weeks    Status  Partially Met      OT LONG TERM GOAL #4   Title  Patient will demonstrate upright posture with shoulders back and head up during exercises with minimal cues.      Baseline  moderate cues for positioning during exercises.     Time  12    Period  Weeks    Status  New      OT LONG TERM GOAL #5   Title  Patient will demonstrate turning behaviors with rollator using large steps and keeping feet behind the rollator with all turns with occasional minimal cues.     Time  12    Period  Weeks    Status  On-going      OT LONG TERM GOAL #6   Title  Patient will improve gait speed and endurance and be able to walk 950 feet in 6 minutes to negotiate around the home and community safely in 4 weeks    Baseline  860 at reassessment, 270 at recert 7/8    Time  4    Period  Weeks     Status  On-going      OT LONG TERM GOAL #7   Title  Patient will complete HEP for maximal  daily exercises with modified independence in 4 weeks    Time  12    Period  Weeks    Status  Partially Met      OT LONG TERM GOAL #8   Title  Patient will transfer from sit to stand without the use of arms safely and independently from a variety of chairs/surfaces in 4 weeks.     Baseline  difficulty from lower surfaces    Time  4    Period  Weeks    Status  Achieved      OT LONG TERM GOAL  #9   Baseline  Patient will demonstrate decreased episodes of freezing of behaviors from score of 16 to score of less than 12.    Time  4    Period  Weeks    Status  Achieved            Plan - 06/23/18 2026    Clinical Impression Statement  Patient seen for additional focus this date on strategies to assist with hesitations and freezing of gait, patient requested to review from last session since he had some trouble remembering all the strategies we used last time.  He continues to struggle with allowing his walker/rollator to get too far ahead, shifts weight to toes and then risks falling forwards.  Advised patient to not try to step forward, but to rather pull back on rollator and shift weight from toes back to heels, he does best with this method and then try to advance forwards.    Occupational Profile and client history currently impacting functional performance  progressive disease process, freezing of gait behaviors, fall risk    Occupational performance deficits (Please refer to evaluation for details):  ADL's;IADL's;Leisure    Rehab Potential  Good    Current Impairments/barriers affecting progress:  positive: family support, negative: level of motivation, progressive disease    OT Frequency  2x / week    OT Duration  12 weeks    OT Treatment/Interventions  Self-care/ADL training;Moist Heat;DME and/or AE instruction;Balance training;Therapeutic activities;Therapeutic exercise;Neuromuscular  education;Functional Mobility Training;Patient/family education    Consulted and Agree with Plan of Care  Patient       Patient will benefit from skilled therapeutic intervention in order to improve the following deficits and impairments:  Abnormal gait, Decreased coordination, Decreased range of motion, Difficulty walking, Decreased endurance, Decreased safety awareness, Decreased activity tolerance, Decreased balance, Impaired UE functional use, Pain, Decreased mobility, Decreased strength  Visit Diagnosis: Unsteadiness on feet  Abnormal posture  Difficulty in walking, not elsewhere classified  Muscle weakness (generalized)  Other lack of coordination    Problem List Patient Active Problem List   Diagnosis Date Noted  . Dementia due to Parkinson's disease without behavioral disturbance ([place]) 07/18/2017  . Neurogenic orthostatic hypotension ([place]) 12/02/2016  . BPH with urinary obstruction 11/14/2016  . Urinary frequency 10/10/2016  . Microscopic hematuria 10/10/2016  . Parkinson's syndrome ([place]) 06/14/2016  . Arthritis 06/14/2016  . Depression 06/14/2016  . Heartburn 06/14/2016  . Urinary incontinence 06/14/2016  . Skin lesion 06/14/2016  . Dementia 06/14/2016  . Bilateral edema of lower extremity 06/14/2016   Achilles Dunk, OTR/L, CLT  Lovett,Amy 06/23/2018, 8:27 PM  [place] MAIN [facility] SERVICES [address] [place], [place], [address] Phone: [phone]   Fax:  [phone]  Name: Luke Durham MRN: [id number] Date of Birth: [birth year]

## 2018-06-23 NOTE — Therapy (Addendum)
[place] MAIN [facility] SERVICES [address] [place], [place], [address] Phone: [phone]   Fax:  [phone]  Occupational Therapy Treatment  Patient Details  Name: Luke Durham MRN: [id number] Date of Birth: [birth year] No data recorded  Encounter Date: 06/19/2018  OT End of Session - 06/23/18 2029    Visit Number  [id number]    Number of Visits  156    Date for OT Re-Evaluation  07/23/18    Authorization Type  Medicare visit 4 of 10    OT Start Time  2563    OT Stop Time  1244    OT Time Calculation (min)  59 min    Activity Tolerance  Patient tolerated treatment well    Behavior During Therapy  [facility] for tasks assessed/performed       Past Medical History:  Diagnosis Date  . Anxiety   . Arthritis   . Asthma    childhood asthma  . Cancer ([place]) 7 years ago   lymphoma, [place] (recent)  . Chronic kidney disease   . Colon polyps   . GERD (gastroesophageal reflux disease)   . History of kidney stones   . Parkinson disease [facility])     Past Surgical History:  Procedure Laterality Date  . COLON SURGERY    . CYSTOSCOPY WITH LITHOLAPAXY N/A 11/14/2016   Procedure: CYSTOSCOPY WITH LITHOLAPAXY;  Surgeon: Hollice Espy, MD;  Location: [facility] ORS;  Service: Urology;  Laterality: N/A;  . EYE SURGERY Bilateral    Cataract Extraction with IOL  . HERNIA REPAIR  20 years  . MOHS SURGERY    . SMALL INTESTINE SURGERY     Per patient 7 years  . TRANSURETHRAL RESECTION OF PROSTATE N/A 11/14/2016   Procedure: TRANSURETHRAL RESECTION OF THE PROSTATE (TURP);  Surgeon: Hollice Espy, MD;  Location: [facility] ORS;  Service: Urology;  Laterality: N/A;    There were no vitals filed for this visit.  Subjective Assessment - 06/23/18 2028    Subjective   patient denies any pain this date, reports he has continued to work on exercises at home.     Pertinent History  Patient reports he was diagnosed with Parkinson's disease about 10 plus years ago.  He reports a more  recent decline in function in his daily activities in the last 6-12 months.  He has been seeing PT for the last couple months.    Patient Stated Goals  Patient reports he wants to be able to do more for himself and around the house.     Currently in Pain?  No/denies    Pain Score  0-No pain                   OT Treatments/Exercises (OP) - 06/23/18 2029      Neurological Re-education Exercises   Other Exercises 1  Patient seen for instruction of LSVT BIG exercises: LSVT Daily Session Maximal Daily Exercises: Sustained movements are designed to rescale the amplitude of movement output for generalization to daily functional activities. Performed as follows for 1 set of 10 repetitions each: Multi directional sustained movements- 1) Floor to ceiling, 2) Side to side. Multi directional Repetitive movements performed in standing and are designed to provide retraining effort needed for sustained muscle activation in tasks Performed as follows: 3) Step and reach forward, 4) Step and Reach Backwards, 5) Step and reach sideways, 6) Rock and reach forward/backward, 7) Rock and reach sideways.  CGA for exercises in standing for exercises ,  verbal and tactile cues to complete with correct form and arm   placements.  Postural exercises with manual stretch by therapist to pectoralis muscles followed by AROM with cues in sitting and standing.  Alternating stepping patterns in standing in a variety of directions with verbal instructions and CGA      Attempted to incorporate "stomps" with exercises this date to increase proprioception of lower extremity participation, and to increase size of movement with lifting Legs.  This also helps patient to have some feedback with the sound of the movement when performing and improves outcome.  Patient has been trying  To work on strategies at home that we perform in the clinic but reports difficulty.  Will continue to work on consistency of responses and driving motor  learning for carryover At home.  Functional mobility with rollator this date for navigating in crowded spaces with fluidity and not demonstrating freezing of gait when people cross his path.       OT Education - 06/23/18 2029    Education provided  Yes    Education Details  amplitude of movement, posture    Person(s) Educated  Patient    Methods  Demonstration;Explanation;Verbal cues;Tactile cues    Comprehension  Verbal cues required;Returned demonstration;Verbalized understanding;Tactile cues required          OT Long Term Goals - 06/09/18 1504      OT LONG TERM GOAL #1   Title  Patient will demonstrate increase right hand grip to be able to cut food with modified independence.     Baseline  occasional assist required.    Time  12    Period  Weeks    Status  Partially Met      OT LONG TERM GOAL #2   Title  Patient will complete donning long sleeve shirt with modified independence.    Baseline  requires assist with long sleeves, can perform short sleeves.    Time  12    Period  Weeks    Status  Achieved      OT LONG TERM GOAL #3   Title  Patient will improve coordination to manage buttons on clothing with occasional assistance only.    Baseline  requires assist with buttons each time    Time  12    Period  Weeks    Status  Partially Met      OT LONG TERM GOAL #4   Title  Patient will demonstrate upright posture with shoulders back and head up during exercises with minimal cues.      Baseline  moderate cues for positioning during exercises.     Time  12    Period  Weeks    Status  New      OT LONG TERM GOAL #5   Title  Patient will demonstrate turning behaviors with rollator using large steps and keeping feet behind the rollator with all turns with occasional minimal cues.     Time  12    Period  Weeks    Status  On-going      OT LONG TERM GOAL #6   Title  Patient will improve gait speed and endurance and be able to walk 950 feet in 6 minutes to negotiate  around the home and community safely in 4 weeks    Baseline  860 at reassessment, 154 at recert 7/8    Time  4    Period  Weeks    Status  On-going  OT LONG TERM GOAL #7   Title  Patient will complete HEP for maximal daily exercises with modified independence in 4 weeks    Time  12    Period  Weeks    Status  Partially Met      OT LONG TERM GOAL #8   Title  Patient will transfer from sit to stand without the use of arms safely and independently from a variety of chairs/surfaces in 4 weeks.     Baseline  difficulty from lower surfaces    Time  4    Period  Weeks    Status  Achieved      OT LONG TERM GOAL  #9   Baseline  Patient will demonstrate decreased episodes of freezing of behaviors from score of 16 to score of less than 12.    Time  4    Period  Weeks    Status  Achieved            Plan - 06/23/18 2030    Clinical Impression Statement  Patient's memory limits his carryover at home although he can often recall strategies when asked however, when in the moment at home, I do not feel he can consistently think of and implement some of these strategies.  Have worked with wife on cues to provide and patterns to look out for to assist with patient's progress.  At times, he is often coming out of his bathroom or bedroom when freezing of gait occurs.  Will continue to work on crossing thresholds and attempts to diminish freezing responses.     Occupational Profile and client history currently impacting functional performance  progressive disease process, freezing of gait behaviors, fall risk    Occupational performance deficits (Please refer to evaluation for details):  ADL's;IADL's;Leisure    Rehab Potential  Good    Current Impairments/barriers affecting progress:  positive: family support, negative: level of motivation, progressive disease    OT Frequency  2x / week    OT Duration  12 weeks    OT Treatment/Interventions  Self-care/ADL training;Moist Heat;DME and/or AE  instruction;Balance training;Therapeutic activities;Therapeutic exercise;Neuromuscular education;Functional Mobility Training;Patient/family education    Consulted and Agree with Plan of Care  Patient       Patient will benefit from skilled therapeutic intervention in order to improve the following deficits and impairments:  Abnormal gait, Decreased coordination, Decreased range of motion, Difficulty walking, Decreased endurance, Decreased safety awareness, Decreased activity tolerance, Decreased balance, Impaired UE functional use, Pain, Decreased mobility, Decreased strength  Visit Diagnosis: Unsteadiness on feet  Abnormal posture  Difficulty in walking, not elsewhere classified  Muscle weakness (generalized)  Other lack of coordination    Problem List Patient Active Problem List   Diagnosis Date Noted  . Dementia due to Parkinson's disease without behavioral disturbance ([place]) 07/18/2017  . Neurogenic orthostatic hypotension ([place]) 12/02/2016  . BPH with urinary obstruction 11/14/2016  . Urinary frequency 10/10/2016  . Microscopic hematuria 10/10/2016  . Parkinson's syndrome ([place]) 06/14/2016  . Arthritis 06/14/2016  . Depression 06/14/2016  . Heartburn 06/14/2016  . Urinary incontinence 06/14/2016  . Skin lesion 06/14/2016  . Dementia 06/14/2016  . Bilateral edema of lower extremity 06/14/2016   Achilles Dunk, OTR/L, CLT  Lovett,Amy 06/23/2018, 8:31 PM  [place] MAIN [facility] SERVICES [address] [place], [place], [address] Phone: [phone]   Fax:  [phone]  Name: Luke Durham MRN: [id number] Date of Birth: 09-20-1933

## 2018-06-23 NOTE — Therapy (Addendum)
[place] MAIN [facility] SERVICES [address] [place], [place], [address] Phone: [phone]   Fax:  [phone]  Occupational Therapy Treatment  Patient Details  Name: Luke Durham MRN: [id number] Date of Birth: November 15, 1932 No data recorded  Encounter Date: 06/11/2018  OT End of Session - 06/23/18 2021    Visit Number  132    Number of Visits  156    Date for OT Re-Evaluation  07/23/18    Authorization Type  Medicare visit 2 of 10    OT Start Time  7253    OT Stop Time  1400    OT Time Calculation (min)  55 min    Activity Tolerance  Patient tolerated treatment well    Behavior During Therapy  [facility] for tasks assessed/performed       Past Medical History:  Diagnosis Date  . Anxiety   . Arthritis   . Asthma    childhood asthma  . Cancer ([place]) 7 years ago   lymphoma, [place] (recent)  . Chronic kidney disease   . Colon polyps   . GERD (gastroesophageal reflux disease)   . History of kidney stones   . Parkinson disease [facility])     Past Surgical History:  Procedure Laterality Date  . COLON SURGERY    . CYSTOSCOPY WITH LITHOLAPAXY N/A 11/14/2016   Procedure: CYSTOSCOPY WITH LITHOLAPAXY;  Surgeon: Hollice Espy, MD;  Location: [facility] ORS;  Service: Urology;  Laterality: N/A;  . EYE SURGERY Bilateral    Cataract Extraction with IOL  . HERNIA REPAIR  20 years  . MOHS SURGERY    . SMALL INTESTINE SURGERY     Per patient 7 years  . TRANSURETHRAL RESECTION OF PROSTATE N/A 11/14/2016   Procedure: TRANSURETHRAL RESECTION OF THE PROSTATE (TURP);  Surgeon: Hollice Espy, MD;  Location: [facility] ORS;  Service: Urology;  Laterality: N/A;    There were no vitals filed for this visit.  Subjective Assessment - 06/23/18 2019    Subjective   Patient reports he is doing well, had a good weekend, no complaints    Pertinent History  Patient reports he was diagnosed with Parkinson's disease about 10 plus years ago.  He reports a more recent decline in  function in his daily activities in the last 6-12 months.  He has been seeing PT for the last couple months.    Patient Stated Goals  Patient reports he wants to be able to do more for himself and around the house.     Currently in Pain?  No/denies    Pain Score  0-No pain                   OT Treatments/Exercises (OP) - 06/23/18 2020      Neurological Re-education Exercises   Other Exercises 1  Patient seen for instruction of LSVT BIG exercises: LSVT Daily Session Maximal Daily Exercises: Sustained movements are designed to rescale the amplitude of movement output for generalization to daily functional activities. Performed as follows for 1 set of 10 repetitions each: Multi directional sustained movements- 1) Floor to ceiling, 2) Side to side. Multi directional Repetitive movements performed in standing and are designed to provide retraining effort needed for sustained muscle activation in tasks Performed as follows: 3) Step and reach forward, 4) Step and Reach Backwards, 5) Step and reach sideways, 6) Rock and reach forward/backward, 7) Rock and reach sideways.  CGA for exercises in standing for exercises , verbal and tactile cues to  complete with correct form and arm   placements.  Postural exercises with manual stretch by therapist to pectoralis muscles followed by AROM with cues in sitting and standing.  Alternating stepping patterns in standing in a variety of directions with verbal instructions and CGA       Pt seen for cues for big posture throughout all exercises due to flexed posture which affects his balance during all tasks.  Balance activities in standing with a variety of stepping patterns in all directions with cues for BIG movements, speed of responses And use of stop and go patterns with cue words to assist with diminishing hesitations.   Functional mobility with rollator for 2 trials of 350 feet with emphasis on turns in large and small spaces, cues for positioning of  self in  Relation to rollator and decrease fall risk        OT Education - 06/23/18 2021    Education provided  Yes    Education Details  daily exercises    Person(s) Educated  Patient    Methods  Demonstration;Explanation;Verbal cues;Tactile cues    Comprehension  Verbal cues required;Returned demonstration;Verbalized understanding;Tactile cues required          OT Long Term Goals - 06/09/18 1504      OT LONG TERM GOAL #1   Title  Patient will demonstrate increase right hand grip to be able to cut food with modified independence.     Baseline  occasional assist required.    Time  12    Period  Weeks    Status  Partially Met      OT LONG TERM GOAL #2   Title  Patient will complete donning long sleeve shirt with modified independence.    Baseline  requires assist with long sleeves, can perform short sleeves.    Time  12    Period  Weeks    Status  Achieved      OT LONG TERM GOAL #3   Title  Patient will improve coordination to manage buttons on clothing with occasional assistance only.    Baseline  requires assist with buttons each time    Time  12    Period  Weeks    Status  Partially Met      OT LONG TERM GOAL #4   Title  Patient will demonstrate upright posture with shoulders back and head up during exercises with minimal cues.      Baseline  moderate cues for positioning during exercises.     Time  12    Period  Weeks    Status  New      OT LONG TERM GOAL #5   Title  Patient will demonstrate turning behaviors with rollator using large steps and keeping feet behind the rollator with all turns with occasional minimal cues.     Time  12    Period  Weeks    Status  On-going      OT LONG TERM GOAL #6   Title  Patient will improve gait speed and endurance and be able to walk 950 feet in 6 minutes to negotiate around the home and community safely in 4 weeks    Baseline  860 at reassessment, 469 at recert 7/8    Time  4    Period  Weeks    Status  On-going       OT LONG TERM GOAL #7   Title  Patient will complete HEP for maximal daily exercises with  modified independence in 4 weeks    Time  12    Period  Weeks    Status  Partially Met      OT LONG TERM GOAL #8   Title  Patient will transfer from sit to stand without the use of arms safely and independently from a variety of chairs/surfaces in 4 weeks.     Baseline  difficulty from lower surfaces    Time  4    Period  Weeks    Status  Achieved      OT LONG TERM GOAL  #9   Baseline  Patient will demonstrate decreased episodes of freezing of behaviors from score of 16 to score of less than 12.    Time  4    Period  Weeks    Status  Achieved            Plan - 06/23/18 2022    Clinical Impression Statement  Patient responds well to verbal cues from therapist in the clinic but states he tries to use the same phrases at home when he feels stuck or frozen and just cannot make himself go.  We discussed multiple options for words/phrases and movements such  As attempts for marching to get feet moving prior to trying to take a step.  Patient able to demo in the clinic but reports difficulty at home.   Occupational Profile and client history currently impacting functional performance  progressive disease process, freezing of gait behaviors, fall risk    Occupational performance deficits (Please refer to evaluation for details):  ADL's;IADL's;Leisure    Rehab Potential  Good    Current Impairments/barriers affecting progress:  positive: family support, negative: level of motivation, progressive disease    OT Frequency  2x / week    OT Duration  12 weeks    OT Treatment/Interventions  Self-care/ADL training;Moist Heat;DME and/or AE instruction;Balance training;Therapeutic activities;Therapeutic exercise;Neuromuscular education;Functional Mobility Training;Patient/family education    Consulted and Agree with Plan of Care  Patient       Patient will benefit from skilled therapeutic intervention in  order to improve the following deficits and impairments:  Abnormal gait, Decreased coordination, Decreased range of motion, Difficulty walking, Decreased endurance, Decreased safety awareness, Decreased activity tolerance, Decreased balance, Impaired UE functional use, Pain, Decreased mobility, Decreased strength  Visit Diagnosis: Unsteadiness on feet  Abnormal posture  Difficulty in walking, not elsewhere classified  Muscle weakness (generalized)  Other lack of coordination    Problem List Patient Active Problem List   Diagnosis Date Noted  . Dementia due to Parkinson's disease without behavioral disturbance ([place]) 07/18/2017  . Neurogenic orthostatic hypotension ([place]) 12/02/2016  . BPH with urinary obstruction 11/14/2016  . Urinary frequency 10/10/2016  . Microscopic hematuria 10/10/2016  . Parkinson's syndrome ([place]) 06/14/2016  . Arthritis 06/14/2016  . Depression 06/14/2016  . Heartburn 06/14/2016  . Urinary incontinence 06/14/2016  . Skin lesion 06/14/2016  . Dementia 06/14/2016  . Bilateral edema of lower extremity 06/14/2016   Achilles Dunk, OTR/L, CLT  Lovett,Amy 06/23/2018, 8:23 PM  [place] MAIN [facility] SERVICES [address] [place], [place], [address] Phone: [phone]   Fax:  [phone]  Name: Luke Durham MRN: [id number] Date of Birth: 02-Sep-1933

## 2018-06-23 NOTE — Therapy (Addendum)
[place] MAIN [facility] SERVICES [address] [place], [place], [address] Phone: [phone]   Fax:  [phone]  Occupational Therapy Treatment  Patient Details  Name: Luke Durham MRN: [id number] Date of Birth: [birth year] No data recorded  Encounter Date: 06/22/2018  OT End of Session - 06/23/18 2033    Visit Number  135    Number of Visits  156    Date for OT Re-Evaluation  07/23/18    Authorization Type  Medicare visit 5 of 10    OT Start Time  1100    OT Stop Time  1200    OT Time Calculation (min)  60 min    Activity Tolerance  Patient tolerated treatment well    Behavior During Therapy  [facility] for tasks assessed/performed       Past Medical History:  Diagnosis Date  . Anxiety   . Arthritis   . Asthma    childhood asthma  . Cancer ([place]) 7 years ago   lymphoma, [place] (recent)  . Chronic kidney disease   . Colon polyps   . GERD (gastroesophageal reflux disease)   . History of kidney stones   . Parkinson disease [facility])     Past Surgical History:  Procedure Laterality Date  . COLON SURGERY    . CYSTOSCOPY WITH LITHOLAPAXY N/A 11/14/2016   Procedure: CYSTOSCOPY WITH LITHOLAPAXY;  Surgeon: Hollice Espy, MD;  Location: [facility] ORS;  Service: Urology;  Laterality: N/A;  . EYE SURGERY Bilateral    Cataract Extraction with IOL  . HERNIA REPAIR  20 years  . MOHS SURGERY    . SMALL INTESTINE SURGERY     Per patient 7 years  . TRANSURETHRAL RESECTION OF PROSTATE N/A 11/14/2016   Procedure: TRANSURETHRAL RESECTION OF THE PROSTATE (TURP);  Surgeon: Hollice Espy, MD;  Location: [facility] ORS;  Service: Urology;  Laterality: N/A;    There were no vitals filed for this visit.  Subjective Assessment - 06/23/18 2032    Subjective   Patient reports the other day he was frozen at the doorway to his bedroom and it felt like he was stuck for an hour.     Pertinent History  Patient reports he was diagnosed with Parkinson's disease about 10 plus  years ago.  He reports a more recent decline in function in his daily activities in the last 6-12 months.  He has been seeing PT for the last couple months.    Patient Stated Goals  Patient reports he wants to be able to do more for himself and around the house.     Currently in Pain?  No/denies    Pain Score  0-No pain                   OT Treatments/Exercises (OP) - 06/23/18 2033      Neurological Re-education Exercises   Other Exercises 1  Patient seen for instruction of LSVT BIG exercises: LSVT Daily Session Maximal Daily Exercises: Sustained movements are designed to rescale the amplitude of movement output for generalization to daily functional activities. Performed as follows for 1 set of 10 repetitions each: Multi directional sustained movements- 1) Floor to ceiling, 2) Side to side. Multi directional Repetitive movements performed in standing and are designed to provide retraining effort needed for sustained muscle activation in tasks Performed as follows: 3) Step and reach forward, 4) Step and Reach Backwards, 5) Step and reach sideways, 6) Rock and reach forward/backward, 7) Rock and reach sideways.  CGA for exercises in standing for exercises , verbal and tactile cues to complete with correct form and arm   placements.  Postural exercises with manual stretch by therapist to pectoralis muscles followed by AROM with cues in sitting and standing.  Alternating stepping patterns in standing in a variety of directions with verbal instructions and CGA       Functional mobility outdoors this date with mobility over uneven surfaces, winding pathways and sloped areas.  Patient tends to shift weight to Toes at times and requires cues to shift weight backwards to heels, especially when navigating down sloped paths.  He requests to continue to work on  His posture and reports manual stretching and exercises make him feel the best and helps with improvements in balance.  Wife is consistent  with providing cues  For posture during mobility most times.        OT Education - 06/23/18 2033    Education provided  Yes    Education Details  freezing of gait    Person(s) Educated  Patient    Methods  Demonstration;Explanation;Verbal cues;Tactile cues    Comprehension  Verbal cues required;Returned demonstration;Verbalized understanding;Tactile cues required          OT Long Term Goals - 06/09/18 1504      OT LONG TERM GOAL #1   Title  Patient will demonstrate increase right hand grip to be able to cut food with modified independence.     Baseline  occasional assist required.    Time  12    Period  Weeks    Status  Partially Met      OT LONG TERM GOAL #2   Title  Patient will complete donning long sleeve shirt with modified independence.    Baseline  requires assist with long sleeves, can perform short sleeves.    Time  12    Period  Weeks    Status  Achieved      OT LONG TERM GOAL #3   Title  Patient will improve coordination to manage buttons on clothing with occasional assistance only.    Baseline  requires assist with buttons each time    Time  12    Period  Weeks    Status  Partially Met      OT LONG TERM GOAL #4   Title  Patient will demonstrate upright posture with shoulders back and head up during exercises with minimal cues.      Baseline  moderate cues for positioning during exercises.     Time  12    Period  Weeks    Status  New      OT LONG TERM GOAL #5   Title  Patient will demonstrate turning behaviors with rollator using large steps and keeping feet behind the rollator with all turns with occasional minimal cues.     Time  12    Period  Weeks    Status  On-going      OT LONG TERM GOAL #6   Title  Patient will improve gait speed and endurance and be able to walk 950 feet in 6 minutes to negotiate around the home and community safely in 4 weeks    Baseline  860 at reassessment, 322 at recert 7/8    Time  4    Period  Weeks    Status   On-going      OT LONG TERM GOAL #7   Title  Patient will complete HEP for maximal  daily exercises with modified independence in 4 weeks    Time  12    Period  Weeks    Status  Partially Met      OT LONG TERM GOAL #8   Title  Patient will transfer from sit to stand without the use of arms safely and independently from a variety of chairs/surfaces in 4 weeks.     Baseline  difficulty from lower surfaces    Time  4    Period  Weeks    Status  Achieved      OT LONG TERM GOAL  #9   Baseline  Patient will demonstrate decreased episodes of freezing of behaviors from score of 16 to score of less than 12.    Time  4    Period  Weeks    Status  Achieved            Plan - 06/23/18 2034    Clinical Impression Statement  Patient seen for continued focus on weight shifting especially shifting weight from toes to heels.  Patient is familiar with exercises but continues to require cues for form and technique as well as for posture.  He has the most difficulty with standing exercises at home and uses an adapted version of exercises to allow use of a chair for balance. Added some reaching tasks in standing this date while working on balance.  Continue to work towards goals to reduce risk of falls, improve balance and independence in daily tasks.    Occupational Profile and client history currently impacting functional performance  progressive disease process, freezing of gait behaviors, fall risk    Occupational performance deficits (Please refer to evaluation for details):  ADL's;IADL's;Leisure    Rehab Potential  Good    Current Impairments/barriers affecting progress:  positive: family support, negative: level of motivation, progressive disease    OT Frequency  2x / week    OT Duration  12 weeks    OT Treatment/Interventions  Self-care/ADL training;Moist Heat;DME and/or AE instruction;Balance training;Therapeutic activities;Therapeutic exercise;Neuromuscular education;Functional Mobility  Training;Patient/family education    Consulted and Agree with Plan of Care  Patient       Patient will benefit from skilled therapeutic intervention in order to improve the following deficits and impairments:  Abnormal gait, Decreased coordination, Decreased range of motion, Difficulty walking, Decreased endurance, Decreased safety awareness, Decreased activity tolerance, Decreased balance, Impaired UE functional use, Pain, Decreased mobility, Decreased strength  Visit Diagnosis: Unsteadiness on feet  Abnormal posture  Difficulty in walking, not elsewhere classified  Muscle weakness (generalized)  Other lack of coordination    Problem List Patient Active Problem List   Diagnosis Date Noted  . Dementia due to Parkinson's disease without behavioral disturbance ([place]) 07/18/2017  . Neurogenic orthostatic hypotension ([place]) 12/02/2016  . BPH with urinary obstruction 11/14/2016  . Urinary frequency 10/10/2016  . Microscopic hematuria 10/10/2016  . Parkinson's syndrome ([place]) 06/14/2016  . Arthritis 06/14/2016  . Depression 06/14/2016  . Heartburn 06/14/2016  . Urinary incontinence 06/14/2016  . Skin lesion 06/14/2016  . Dementia 06/14/2016  . Bilateral edema of lower extremity 06/14/2016   Achilles Dunk, OTR/L, CLT  Lovett,Amy 06/23/2018, 8:35 PM  [place] MAIN [facility] SERVICES [address] [place], [place], [address] Phone: [phone]   Fax:  [phone]  Name: Carder Yin MRN: [id number] Date of Birth: October 21, 1933

## 2018-06-26 ENCOUNTER — Ambulatory Visit: Payer: Medicare Other | Attending: Physician Assistant | Admitting: Occupational Therapy

## 2018-06-26 DIAGNOSIS — M6281 Muscle weakness (generalized): Secondary | ICD-10-CM | POA: Diagnosis present

## 2018-06-26 DIAGNOSIS — R278 Other lack of coordination: Secondary | ICD-10-CM | POA: Diagnosis present

## 2018-06-26 DIAGNOSIS — R262 Difficulty in walking, not elsewhere classified: Secondary | ICD-10-CM

## 2018-06-26 DIAGNOSIS — R2681 Unsteadiness on feet: Secondary | ICD-10-CM | POA: Insufficient documentation

## 2018-06-26 DIAGNOSIS — R293 Abnormal posture: Secondary | ICD-10-CM | POA: Diagnosis present

## 2018-06-29 ENCOUNTER — Encounter: Payer: Self-pay | Admitting: Occupational Therapy

## 2018-06-29 NOTE — Therapy (Signed)
[place] MAIN [facility] SERVICES [address] [place], [place], [address] Phone: [phone]   Fax:  [phone]  Occupational Therapy Treatment  Patient Details  Name: Luke Durham MRN: [id number] Date of Birth: October 14, 1933 No data recorded  Encounter Date: 06/26/2018  OT End of Session - 06/29/18 1036    Visit Number  136    Number of Visits  156    Date for OT Re-Evaluation  07/23/18    Authorization Type  Medicare visit 6 of 10    OT Start Time  1101    OT Stop Time  1158    OT Time Calculation (min)  57 min    Activity Tolerance  Patient tolerated treatment well    Behavior During Therapy  [facility] for tasks assessed/performed       Past Medical History:  Diagnosis Date  . Anxiety   . Arthritis   . Asthma    childhood asthma  . Cancer ([place]) 7 years ago   lymphoma, [place] (recent)  . Chronic kidney disease   . Colon polyps   . GERD (gastroesophageal reflux disease)   . History of kidney stones   . Parkinson disease [facility])     Past Surgical History:  Procedure Laterality Date  . COLON SURGERY    . CYSTOSCOPY WITH LITHOLAPAXY N/A 11/14/2016   Procedure: CYSTOSCOPY WITH LITHOLAPAXY;  Surgeon: Hollice Espy, MD;  Location: [facility] ORS;  Service: Urology;  Laterality: N/A;  . EYE SURGERY Bilateral    Cataract Extraction with IOL  . HERNIA REPAIR  20 years  . MOHS SURGERY    . SMALL INTESTINE SURGERY     Per patient 7 years  . TRANSURETHRAL RESECTION OF PROSTATE N/A 11/14/2016   Procedure: TRANSURETHRAL RESECTION OF THE PROSTATE (TURP);  Surgeon: Hollice Espy, MD;  Location: [facility] ORS;  Service: Urology;  Laterality: N/A;    There were no vitals filed for this visit.  Subjective Assessment - 06/29/18 1035    Subjective   Patient reports he had another episode of freezing at the doorway to his bedroom and lasted for a long time, he thinks up to 30 minutes but is not sure.     Pertinent History  Patient reports he was diagnosed with  Parkinson's disease about 10 plus years ago.  He reports a more recent decline in function in his daily activities in the last 6-12 months.  He has been seeing PT for the last couple months.    Patient Stated Goals  Patient reports he wants to be able to do more for himself and around the house.     Currently in Pain?  No/denies    Pain Score  0-No pain                   OT Treatments/Exercises (OP) - 06/29/18 1041      Neurological Re-education Exercises   Other Exercises 1  Patient seen for instruction of LSVT BIG exercises: LSVT Daily Session Maximal Daily Exercises: Sustained movements are designed to rescale the amplitude of movement output for generalization to daily functional activities. Performed as follows for 1 set of 10 repetitions each: Multi directional sustained movements- 1) Floor to ceiling, 2) Side to side. Multi directional Repetitive movements performed in standing and are designed to provide retraining effort needed for sustained muscle activation in tasks Performed as follows: 3) Step and reach forward, 4) Step and Reach Backwards, 5) Step and reach sideways, 6) Rock and reach forward/backward,  7) Rock and reach sideways.  CGA for exercises in standing for exercises , verbal and tactile cues to complete with correct form and arm   placements.  Postural exercises with manual stretch by therapist to pectoralis muscles followed by AROM with cues in sitting and standing.      Other Exercises 2  Freezing of gait strategies in a variety of doorways, during turns and with stop and go patterns.  Patient demonstrates occasional freezing of gait but lasting less than 10 secs and responds to cues and strategies              OT Education - 06/29/18 1035    Education provided  Yes    Education Details  compensatory strategies to combat freezing of gait episodes    Person(s) Educated  Patient    Methods  Demonstration;Explanation;Verbal cues;Tactile cues    Comprehension   Verbal cues required;Returned demonstration;Verbalized understanding;Tactile cues required          OT Long Term Goals - 06/09/18 1504      OT LONG TERM GOAL #1   Title  Patient will demonstrate increase right hand grip to be able to cut food with modified independence.     Baseline  occasional assist required.    Time  12    Period  Weeks    Status  Partially Met      OT LONG TERM GOAL #2   Title  Patient will complete donning long sleeve shirt with modified independence.    Baseline  requires assist with long sleeves, can perform short sleeves.    Time  12    Period  Weeks    Status  Achieved      OT LONG TERM GOAL #3   Title  Patient will improve coordination to manage buttons on clothing with occasional assistance only.    Baseline  requires assist with buttons each time    Time  12    Period  Weeks    Status  Partially Met      OT LONG TERM GOAL #4   Title  Patient will demonstrate upright posture with shoulders back and head up during exercises with minimal cues.      Baseline  moderate cues for positioning during exercises.     Time  12    Period  Weeks    Status  New      OT LONG TERM GOAL #5   Title  Patient will demonstrate turning behaviors with rollator using large steps and keeping feet behind the rollator with all turns with occasional minimal cues.     Time  12    Period  Weeks    Status  On-going      OT LONG TERM GOAL #6   Title  Patient will improve gait speed and endurance and be able to walk 950 feet in 6 minutes to negotiate around the home and community safely in 4 weeks    Baseline  860 at reassessment, 161 at recert 7/8    Time  4    Period  Weeks    Status  On-going      OT LONG TERM GOAL #7   Title  Patient will complete HEP for maximal daily exercises with modified independence in 4 weeks    Time  12    Period  Weeks    Status  Partially Met      OT LONG TERM GOAL #8   Title  Patient will  transfer from sit to stand without the use  of arms safely and independently from a variety of chairs/surfaces in 4 weeks.     Baseline  difficulty from lower surfaces    Time  4    Period  Weeks    Status  Achieved      OT LONG TERM GOAL  #9   Baseline  Patient will demonstrate decreased episodes of freezing of behaviors from score of 16 to score of less than 12.    Time  4    Period  Weeks    Status  Achieved            Plan - 06/29/18 1036    Clinical Impression Statement  Patient has reported 2 episodes over the last week in which he says he is experiencing freezing of gait for extended periods of time, both times were at the doorway to his bedroom.  He reports once was close to an hour and the other 30 minutes however he is not sure and states, "it felt like that long" , wife could not verify.  He has not had any episodes during therapy in which he experiences freezing for greater than 30 secs and most of the time less than 10 secs.  We are continuing to work on strategies to diminish this and they appear to work in the clinic however he doesnt feel he has the same carryover at home.     Occupational Profile and client history currently impacting functional performance  progressive disease process, freezing of gait behaviors, fall risk    Occupational performance deficits (Please refer to evaluation for details):  ADL's;IADL's;Leisure    Rehab Potential  Good    Current Impairments/barriers affecting progress:  positive: family support, negative: level of motivation, progressive disease    OT Frequency  2x / week    OT Duration  12 weeks    OT Treatment/Interventions  Self-care/ADL training;Moist Heat;DME and/or AE instruction;Balance training;Therapeutic activities;Therapeutic exercise;Neuromuscular education;Functional Mobility Training;Patient/family education    Consulted and Agree with Plan of Care  Patient       Patient will benefit from skilled therapeutic intervention in order to improve the following deficits and  impairments:  Abnormal gait, Decreased coordination, Decreased range of motion, Difficulty walking, Decreased endurance, Decreased safety awareness, Decreased activity tolerance, Decreased balance, Impaired UE functional use, Pain, Decreased mobility, Decreased strength  Visit Diagnosis: Unsteadiness on feet  Abnormal posture  Difficulty in walking, not elsewhere classified  Muscle weakness (generalized)  Other lack of coordination    Problem List Patient Active Problem List   Diagnosis Date Noted  . Dementia due to Parkinson's disease without behavioral disturbance ([place]) 07/18/2017  . Neurogenic orthostatic hypotension ([place]) 12/02/2016  . BPH with urinary obstruction 11/14/2016  . Urinary frequency 10/10/2016  . Microscopic hematuria 10/10/2016  . Parkinson's syndrome ([place]) 06/14/2016  . Arthritis 06/14/2016  . Depression 06/14/2016  . Heartburn 06/14/2016  . Urinary incontinence 06/14/2016  . Skin lesion 06/14/2016  . Dementia 06/14/2016  . Bilateral edema of lower extremity 06/14/2016   Achilles Dunk, OTR/L, CLT  Lovett,Amy 06/29/2018, 10:43 AM  [place] MAIN [facility] SERVICES [address] [place], [place], [address] Phone: [phone]   Fax:  [phone]  Name: Luke Durham MRN: [id number] Date of Birth: Nov 25, 1932

## 2018-07-02 ENCOUNTER — Ambulatory Visit: Payer: Medicare Other | Admitting: Occupational Therapy

## 2018-07-02 DIAGNOSIS — R278 Other lack of coordination: Secondary | ICD-10-CM

## 2018-07-02 DIAGNOSIS — R2681 Unsteadiness on feet: Secondary | ICD-10-CM | POA: Diagnosis not present

## 2018-07-02 DIAGNOSIS — R293 Abnormal posture: Secondary | ICD-10-CM

## 2018-07-02 DIAGNOSIS — M6281 Muscle weakness (generalized): Secondary | ICD-10-CM

## 2018-07-02 DIAGNOSIS — R262 Difficulty in walking, not elsewhere classified: Secondary | ICD-10-CM

## 2018-07-03 ENCOUNTER — Ambulatory Visit: Payer: Medicare Other | Admitting: Urology

## 2018-07-03 ENCOUNTER — Encounter: Payer: Self-pay | Admitting: Urology

## 2018-07-03 VITALS — BP 110/62 | HR 90 | Ht 66.0 in | Wt 165.0 lb

## 2018-07-03 DIAGNOSIS — R310 Gross hematuria: Secondary | ICD-10-CM | POA: Diagnosis not present

## 2018-07-03 DIAGNOSIS — N401 Enlarged prostate with lower urinary tract symptoms: Secondary | ICD-10-CM

## 2018-07-03 DIAGNOSIS — N138 Other obstructive and reflux uropathy: Secondary | ICD-10-CM

## 2018-07-03 LAB — URINALYSIS, COMPLETE
Bilirubin, UA: NEGATIVE
Glucose, UA: NEGATIVE
Leukocytes, UA: NEGATIVE
Nitrite, UA: NEGATIVE
Specific Gravity, UA: 1.025 (ref 1.005–1.030)
Urobilinogen, Ur: 1 mg/dL (ref 0.2–1.0)
pH, UA: 5.5 (ref 5.0–7.5)

## 2018-07-03 LAB — MICROSCOPIC EXAMINATION: RBC, UA: >30 /hpf — ABNORMAL HIGH (ref 0–2)

## 2018-07-03 MED ORDER — FINASTERIDE 5 MG PO TABS: 5.0000 mg | ORAL_TABLET | Freq: Every day | ORAL | 11 refills | Status: DC

## 2018-07-03 NOTE — Progress Notes (Signed)
   07/03/18  CC:  Chief Complaint  Patient presents with  . Cysto    HPI: 84 year old male with a personal history of BPH and history of bladder stones who presents today with recurrent episodes of gross hematuria.  She has not seen blood in several weeks but then started having bleeding again this morning upon straining with a bowel movement.  Blood pressure 110/62, pulse 90, height 5\' 6"  (1.676 m), weight 165 lb (74.8 kg). NED. A&Ox3.   No respiratory distress   Abd soft, NT, ND Normal phallus with bilateral descended testicles  Cystoscopy Procedure Note  Patient identification was confirmed, informed consent was obtained, and patient was prepped using Betadine solution.  Lidocaine jelly was administered per urethral meatus.    Preoperative abx where received prior to procedure.     Pre-Procedure: - Inspection reveals a normal caliber ureteral meatus.  Procedure: The flexible cystoscope was introduced without difficulty - No urethral strictures/lesions are present. - Enlarged prostate with friable prostatic mucosa, bleeding with manipulation noted - Friable bladder neck Visualization was somewhat limited due to active bleeding and clots.  The bladder was irrigated several times with fairly decent visualization of the mucosa with no obvious stones or lesions  Post-Procedure: - Patient tolerated the procedure well  Assessment/ Plan:  1. Gross hematuria Secondary to friable prostatic mucosa, bleeding with manipulation noted today I recommended reinitiation of finasteride to help with this Follow-up in 3 months for reassessment - Urinalysis, Complete  2. Benign prostatic hyperplasia with urinary obstruction As above - CULTURE, URINE COMPREHENSIVE   Return in about 3 months (around 10/02/2018) for [facility] UA/ IPSS/ PVR.  Hollice Espy, MD

## 2018-07-06 ENCOUNTER — Ambulatory Visit: Payer: Medicare Other | Admitting: Occupational Therapy

## 2018-07-06 DIAGNOSIS — R293 Abnormal posture: Secondary | ICD-10-CM

## 2018-07-06 DIAGNOSIS — M6281 Muscle weakness (generalized): Secondary | ICD-10-CM

## 2018-07-06 DIAGNOSIS — R262 Difficulty in walking, not elsewhere classified: Secondary | ICD-10-CM

## 2018-07-06 DIAGNOSIS — R2681 Unsteadiness on feet: Secondary | ICD-10-CM

## 2018-07-06 DIAGNOSIS — R278 Other lack of coordination: Secondary | ICD-10-CM

## 2018-07-06 LAB — CULTURE, URINE COMPREHENSIVE

## 2018-07-07 ENCOUNTER — Encounter: Payer: Self-pay | Admitting: Occupational Therapy

## 2018-07-07 NOTE — Therapy (Signed)
[place] MAIN [facility] SERVICES [address] [place], [place], [address] Phone: [phone]   Fax:  [phone]  Occupational Therapy Treatment  Patient Details  Name: Luke Durham MRN: [id number] Date of Birth: 09-04-1933 No data recorded  Encounter Date: 07/06/2018  OT End of Session - 07/07/18 1949    Visit Number  138    Number of Visits  156    Date for OT Re-Evaluation  07/23/18    Authorization Type  Medicare visit 8 of 10    OT Start Time  1001    OT Stop Time  1100    OT Time Calculation (min)  59 min    Activity Tolerance  Patient tolerated treatment well    Behavior During Therapy  [facility] for tasks assessed/performed       Past Medical History:  Diagnosis Date  . Anxiety   . Arthritis   . Asthma    childhood asthma  . Cancer ([place]) 7 years ago   lymphoma, [place] (recent)  . Chronic kidney disease   . Colon polyps   . GERD (gastroesophageal reflux disease)   . History of kidney stones   . Parkinson disease [facility])     Past Surgical History:  Procedure Laterality Date  . COLON SURGERY    . CYSTOSCOPY WITH LITHOLAPAXY N/A 11/14/2016   Procedure: CYSTOSCOPY WITH LITHOLAPAXY;  Surgeon: Hollice Espy, MD;  Location: [facility] ORS;  Service: Urology;  Laterality: N/A;  . EYE SURGERY Bilateral    Cataract Extraction with IOL  . HERNIA REPAIR  20 years  . MOHS SURGERY    . SMALL INTESTINE SURGERY     Per patient 7 years  . TRANSURETHRAL RESECTION OF PROSTATE N/A 11/14/2016   Procedure: TRANSURETHRAL RESECTION OF THE PROSTATE (TURP);  Surgeon: Hollice Espy, MD;  Location: [facility] ORS;  Service: Urology;  Laterality: N/A;    There were no vitals filed for this visit.  Subjective Assessment - 07/07/18 1945    Subjective   Patient reports he is feeling better today than he did earlier in the week. No complaints of pain    Pertinent History  Patient reports he was diagnosed with Parkinson's disease about 10 plus years ago.  He reports a  more recent decline in function in his daily activities in the last 6-12 months.  He has been seeing PT for the last couple months.    Patient Stated Goals  Patient reports he wants to be able to do more for himself and around the house.     Currently in Pain?  No/denies    Pain Score  0-No pain                   OT Treatments/Exercises (OP) - 07/07/18 1946      Neurological Re-education Exercises   Other Exercises 1  Patient seen for instruction of LSVT BIG exercises: LSVT Daily Session Maximal Daily Exercises: Sustained movements are designed to rescale the amplitude of movement output for generalization to daily functional activities. Performed as follows for 1 set of 10 repetitions each: Multi directional sustained movements- 1) Floor to ceiling, 2) Side to side. Multi directional Repetitive movements performed in standing and are designed to provide retraining effort needed for sustained muscle activation in tasks Performed as follows: 3) Step and reach forward, 4) Step and Reach Backwards, 5) Step and reach sideways, 6) Rock and reach forward/backward, 7) Rock and reach sideways.  CGA for exercises in standing for exercises ,  verbal and tactile cues to complete with correct form and arm   placements.  Postural exercises with manual stretch by therapist to pectoralis muscles followed by AROM with cues in sitting and standing.      Other Exercises 2  Exercises with ball in standing for BUE ROM and posture with back against the wall, cues to keep shoulders back, head up.  2 sets of 10 repetitions completed.  Negotiation over a variety of thresholds from carpet to carpet, carpet to tile and tile to tile transitions with cues for size of steps and continuous movement rather than stopping just before crossing.  Functional mobility for 2 trials of 400 feet with cues for turning.              OT Education - 07/07/18 1948    Education provided  Yes    Education Details  turns,  posture, stop and go patterns    Person(s) Educated  Patient    Methods  Demonstration;Explanation;Verbal cues;Tactile cues    Comprehension  Verbal cues required;Returned demonstration;Verbalized understanding;Tactile cues required          OT Long Term Goals - 06/09/18 1504      OT LONG TERM GOAL #1   Title  Patient will demonstrate increase right hand grip to be able to cut food with modified independence.     Baseline  occasional assist required.    Time  12    Period  Weeks    Status  Partially Met      OT LONG TERM GOAL #2   Title  Patient will complete donning long sleeve shirt with modified independence.    Baseline  requires assist with long sleeves, can perform short sleeves.    Time  12    Period  Weeks    Status  Achieved      OT LONG TERM GOAL #3   Title  Patient will improve coordination to manage buttons on clothing with occasional assistance only.    Baseline  requires assist with buttons each time    Time  12    Period  Weeks    Status  Partially Met      OT LONG TERM GOAL #4   Title  Patient will demonstrate upright posture with shoulders back and head up during exercises with minimal cues.      Baseline  moderate cues for positioning during exercises.     Time  12    Period  Weeks    Status  New      OT LONG TERM GOAL #5   Title  Patient will demonstrate turning behaviors with rollator using large steps and keeping feet behind the rollator with all turns with occasional minimal cues.     Time  12    Period  Weeks    Status  On-going      OT LONG TERM GOAL #6   Title  Patient will improve gait speed and endurance and be able to walk 950 feet in 6 minutes to negotiate around the home and community safely in 4 weeks    Baseline  860 at reassessment, 774 at recert 7/8    Time  4    Period  Weeks    Status  On-going      OT LONG TERM GOAL #7   Title  Patient will complete HEP for maximal daily exercises with modified independence in 4 weeks    Time   12    Period  Weeks    Status  Partially Met      OT LONG TERM GOAL #8   Title  Patient will transfer from sit to stand without the use of arms safely and independently from a variety of chairs/surfaces in 4 weeks.     Baseline  difficulty from lower surfaces    Time  4    Period  Weeks    Status  Achieved      OT LONG TERM GOAL  #9   Baseline  Patient will demonstrate decreased episodes of freezing of behaviors from score of 16 to score of less than 12.    Time  4    Period  Weeks    Status  Achieved            Plan - 07/07/18 1949    Occupational Profile and client history currently impacting functional performance  progressive disease process, freezing of gait behaviors, fall risk    Occupational performance deficits (Please refer to evaluation for details):  ADL's;IADL's;Leisure    Rehab Potential  Good    Current Impairments/barriers affecting progress:  positive: family support, negative: level of motivation, progressive disease    OT Frequency  2x / week    OT Duration  12 weeks    OT Treatment/Interventions  Self-care/ADL training;Moist Heat;DME and/or AE instruction;Balance training;Therapeutic activities;Therapeutic exercise;Neuromuscular education;Functional Mobility Training;Patient/family education    Consulted and Agree with Plan of Care  Patient       Patient will benefit from skilled therapeutic intervention in order to improve the following deficits and impairments:  Abnormal gait, Decreased coordination, Decreased range of motion, Difficulty walking, Decreased endurance, Decreased safety awareness, Decreased activity tolerance, Decreased balance, Impaired UE functional use, Pain, Decreased mobility, Decreased strength  Visit Diagnosis: Unsteadiness on feet  Abnormal posture  Difficulty in walking, not elsewhere classified  Muscle weakness (generalized)  Other lack of coordination    Problem List Patient Active Problem List   Diagnosis Date Noted   . Dementia due to Parkinson's disease without behavioral disturbance ([place]) 07/18/2017  . Neurogenic orthostatic hypotension ([place]) 12/02/2016  . BPH with urinary obstruction 11/14/2016  . Urinary frequency 10/10/2016  . Microscopic hematuria 10/10/2016  . Parkinson's syndrome ([place]) 06/14/2016  . Arthritis 06/14/2016  . Depression 06/14/2016  . Heartburn 06/14/2016  . Urinary incontinence 06/14/2016  . Skin lesion 06/14/2016  . Dementia 06/14/2016  . Bilateral edema of lower extremity 06/14/2016   Achilles Dunk, OTR/L, CLT  Lovett,Amy 07/07/2018, 7:50 PM  [place] MAIN [facility] SERVICES [address] [place], [place], [address] Phone: [phone]   Fax:  [phone]  Name: Lynne Righi MRN: [id number] Date of Birth: 03-09-1933

## 2018-07-07 NOTE — Therapy (Signed)
[place] MAIN [facility] SERVICES [address] [place], [place], [address] Phone: [phone]   Fax:  [phone]  Occupational Therapy Treatment  Patient Details  Name: Luke Durham MRN: [id number] Date of Birth: 28-Dec-1932 No data recorded  Encounter Date: 07/02/2018  OT End of Session - 07/07/18 1935    Visit Number  137    Date for OT Re-Evaluation  07/23/18    Authorization Type  Medicare visit 7 of 10    OT Start Time  1100    OT Stop Time  1200    OT Time Calculation (min)  60 min    Activity Tolerance  Patient tolerated treatment well    Behavior During Therapy  [facility] for tasks assessed/performed       Past Medical History:  Diagnosis Date  . Anxiety   . Arthritis   . Asthma    childhood asthma  . Cancer ([place]) 7 years ago   lymphoma, [place] (recent)  . Chronic kidney disease   . Colon polyps   . GERD (gastroesophageal reflux disease)   . History of kidney stones   . Parkinson disease [facility])     Past Surgical History:  Procedure Laterality Date  . COLON SURGERY    . CYSTOSCOPY WITH LITHOLAPAXY N/A 11/14/2016   Procedure: CYSTOSCOPY WITH LITHOLAPAXY;  Surgeon: Hollice Espy, MD;  Location: [facility] ORS;  Service: Urology;  Laterality: N/A;  . EYE SURGERY Bilateral    Cataract Extraction with IOL  . HERNIA REPAIR  20 years  . MOHS SURGERY    . SMALL INTESTINE SURGERY     Per patient 7 years  . TRANSURETHRAL RESECTION OF PROSTATE N/A 11/14/2016   Procedure: TRANSURETHRAL RESECTION OF THE PROSTATE (TURP);  Surgeon: Hollice Espy, MD;  Location: [facility] ORS;  Service: Urology;  Laterality: N/A;    There were no vitals filed for this visit.  Subjective Assessment - 07/07/18 1930    Subjective   Patient reports he is in a bad mood today, states, "I am just tired of feeling bad."  Reports he has been doing as much exercise as he can.      Pertinent History  Patient reports he was diagnosed with Parkinson's disease about 10 plus years  ago.  He reports a more recent decline in function in his daily activities in the last 6-12 months.  He has been seeing PT for the last couple months.    Patient Stated Goals  Patient reports he wants to be able to do more for himself and around the house.     Currently in Pain?  No/denies    Pain Score  0-No pain                   OT Treatments/Exercises (OP) - 07/07/18 1931      ADLs   ADL Comments  sit to stand from a variety of surfaces without use of hands.  Crossed leg method for donning socks and shoes with prolonged stretch for 5 reps on each leg.        Neurological Re-education Exercises   Other Exercises 1  Patient seen for instruction of LSVT BIG exercises: LSVT Daily Session Maximal Daily Exercises: Sustained movements are designed to rescale the amplitude of movement output for generalization to daily functional activities. Performed as follows for 1 set of 10 repetitions each: Multi directional sustained movements- 1) Floor to ceiling, 2) Side to side. Multi directional Repetitive movements performed in standing and are  designed to provide retraining effort needed for sustained muscle activation in tasks Performed as follows: 3) Step and reach forward, 4) Step and Reach Backwards, 5) Step and reach sideways, 6) Rock and reach forward/backward, 7) Rock and reach sideways.  CGA for exercises in standing for exercises , verbal and tactile cues to complete with correct form and arm   placements.  Postural exercises with manual stretch by therapist to pectoralis muscles followed by AROM with cues in sitting and standing.      Other Exercises 2  Focused on transitions over doorways to dminish freezing of gait behaviors and use of go commands, BIG steps and techniques to keep walker close.  Functional mobility with emphasis on turns with use of larger amplitude of steps and rollator.              OT Education - 07/07/18 1935    Education provided  Yes    Education  Details  compensatory strategies to combat freezing of gait episodes    Person(s) Educated  Patient    Methods  Demonstration;Explanation;Verbal cues;Tactile cues    Comprehension  Verbal cues required;Returned demonstration;Verbalized understanding;Tactile cues required          OT Long Term Goals - 06/09/18 1504      OT LONG TERM GOAL #1   Title  Patient will demonstrate increase right hand grip to be able to cut food with modified independence.     Baseline  occasional assist required.    Time  12    Period  Weeks    Status  Partially Met      OT LONG TERM GOAL #2   Title  Patient will complete donning long sleeve shirt with modified independence.    Baseline  requires assist with long sleeves, can perform short sleeves.    Time  12    Period  Weeks    Status  Achieved      OT LONG TERM GOAL #3   Title  Patient will improve coordination to manage buttons on clothing with occasional assistance only.    Baseline  requires assist with buttons each time    Time  12    Period  Weeks    Status  Partially Met      OT LONG TERM GOAL #4   Title  Patient will demonstrate upright posture with shoulders back and head up during exercises with minimal cues.      Baseline  moderate cues for positioning during exercises.     Time  12    Period  Weeks    Status  New      OT LONG TERM GOAL #5   Title  Patient will demonstrate turning behaviors with rollator using large steps and keeping feet behind the rollator with all turns with occasional minimal cues.     Time  12    Period  Weeks    Status  On-going      OT LONG TERM GOAL #6   Title  Patient will improve gait speed and endurance and be able to walk 950 feet in 6 minutes to negotiate around the home and community safely in 4 weeks    Baseline  860 at reassessment, 454 at recert 7/8    Time  4    Period  Weeks    Status  On-going      OT LONG TERM GOAL #7   Title  Patient will complete HEP for maximal daily exercises with  modified independence in 4 weeks    Time  12    Period  Weeks    Status  Partially Met      OT LONG TERM GOAL #8   Title  Patient will transfer from sit to stand without the use of arms safely and independently from a variety of chairs/surfaces in 4 weeks.     Baseline  difficulty from lower surfaces    Time  4    Period  Weeks    Status  Achieved      OT LONG TERM GOAL  #9   Baseline  Patient will demonstrate decreased episodes of freezing of behaviors from score of 16 to score of less than 12.    Time  4    Period  Weeks    Status  Achieved            Plan - 07/07/18 1935    Clinical Impression Statement  Patient not feeling well today and reports being in a foul mood but willing to participate in therapy.  mild freezing of gait when approaching thresholds and focusing on diminishing freezing of gait behaviors.  Continue to work towards improving balance, safety and independence in daily tasks.     Occupational Profile and client history currently impacting functional performance  progressive disease process, freezing of gait behaviors, fall risk    Occupational performance deficits (Please refer to evaluation for details):  ADL's;IADL's;Leisure    Rehab Potential  Good    Current Impairments/barriers affecting progress:  positive: family support, negative: level of motivation, progressive disease    OT Frequency  2x / week    OT Duration  12 weeks    OT Treatment/Interventions  Self-care/ADL training;Moist Heat;DME and/or AE instruction;Balance training;Therapeutic activities;Therapeutic exercise;Neuromuscular education;Functional Mobility Training;Patient/family education    Consulted and Agree with Plan of Care  Patient       Patient will benefit from skilled therapeutic intervention in order to improve the following deficits and impairments:  Abnormal gait, Decreased coordination, Decreased range of motion, Difficulty walking, Decreased endurance, Decreased safety  awareness, Decreased activity tolerance, Decreased balance, Impaired UE functional use, Pain, Decreased mobility, Decreased strength  Visit Diagnosis: Unsteadiness on feet  Abnormal posture  Difficulty in walking, not elsewhere classified  Muscle weakness (generalized)  Other lack of coordination    Problem List Patient Active Problem List   Diagnosis Date Noted  . Dementia due to Parkinson's disease without behavioral disturbance ([place]) 07/18/2017  . Neurogenic orthostatic hypotension ([place]) 12/02/2016  . BPH with urinary obstruction 11/14/2016  . Urinary frequency 10/10/2016  . Microscopic hematuria 10/10/2016  . Parkinson's syndrome ([place]) 06/14/2016  . Arthritis 06/14/2016  . Depression 06/14/2016  . Heartburn 06/14/2016  . Urinary incontinence 06/14/2016  . Skin lesion 06/14/2016  . Dementia 06/14/2016  . Bilateral edema of lower extremity 06/14/2016   Achilles Dunk, OTR/L, CLT Lovett,Amy 07/07/2018, 7:38 PM  [place] MAIN [facility] SERVICES [address] [place], [place], [address] Phone: [phone]   Fax:  [phone]  Name: Londyn Wotton MRN: [id number] Date of Birth: 02/19/1933

## 2018-07-09 ENCOUNTER — Ambulatory Visit: Payer: Medicare Other | Admitting: Occupational Therapy

## 2018-07-09 DIAGNOSIS — R278 Other lack of coordination: Secondary | ICD-10-CM

## 2018-07-09 DIAGNOSIS — R2681 Unsteadiness on feet: Secondary | ICD-10-CM

## 2018-07-09 DIAGNOSIS — R262 Difficulty in walking, not elsewhere classified: Secondary | ICD-10-CM

## 2018-07-09 DIAGNOSIS — M6281 Muscle weakness (generalized): Secondary | ICD-10-CM

## 2018-07-09 DIAGNOSIS — R293 Abnormal posture: Secondary | ICD-10-CM

## 2018-07-12 ENCOUNTER — Ambulatory Visit: Payer: Medicare Other | Admitting: Occupational Therapy

## 2018-07-17 ENCOUNTER — Ambulatory Visit: Payer: Medicare Other | Admitting: Occupational Therapy

## 2018-07-17 VITALS — BP 119/65

## 2018-07-17 DIAGNOSIS — M6281 Muscle weakness (generalized): Secondary | ICD-10-CM

## 2018-07-17 DIAGNOSIS — R262 Difficulty in walking, not elsewhere classified: Secondary | ICD-10-CM

## 2018-07-17 DIAGNOSIS — R293 Abnormal posture: Secondary | ICD-10-CM

## 2018-07-17 DIAGNOSIS — R278 Other lack of coordination: Secondary | ICD-10-CM

## 2018-07-17 DIAGNOSIS — R2681 Unsteadiness on feet: Secondary | ICD-10-CM | POA: Diagnosis not present

## 2018-07-20 ENCOUNTER — Ambulatory Visit: Payer: Medicare Other | Admitting: Occupational Therapy

## 2018-07-20 ENCOUNTER — Encounter: Payer: Self-pay | Admitting: Occupational Therapy

## 2018-07-20 DIAGNOSIS — M6281 Muscle weakness (generalized): Secondary | ICD-10-CM

## 2018-07-20 DIAGNOSIS — R2681 Unsteadiness on feet: Secondary | ICD-10-CM | POA: Diagnosis not present

## 2018-07-20 DIAGNOSIS — R293 Abnormal posture: Secondary | ICD-10-CM

## 2018-07-20 DIAGNOSIS — R262 Difficulty in walking, not elsewhere classified: Secondary | ICD-10-CM

## 2018-07-20 DIAGNOSIS — R278 Other lack of coordination: Secondary | ICD-10-CM

## 2018-07-20 NOTE — Therapy (Signed)
[place] MAIN [facility] SERVICES [address] [place], [place], [address] Phone: [phone]   Fax:  [phone]  Occupational Therapy Treatment  Patient Details  Name: Luke Durham MRN: [id number] Date of Birth: August 17, 1933 No data recorded  Encounter Date: 07/09/2018  OT End of Session - 07/20/18 0907    Visit Number  139    Number of Visits  156    Date for OT Re-Evaluation  07/23/18    Authorization Type  Medicare visit 9 of 10    OT Start Time  1100    OT Stop Time  1158    OT Time Calculation (min)  58 min    Activity Tolerance  Patient tolerated treatment well    Behavior During Therapy  [facility] for tasks assessed/performed       Past Medical History:  Diagnosis Date  . Anxiety   . Arthritis   . Asthma    childhood asthma  . Cancer ([place]) 7 years ago   lymphoma, [place] (recent)  . Chronic kidney disease   . Colon polyps   . GERD (gastroesophageal reflux disease)   . History of kidney stones   . Parkinson disease [facility])     Past Surgical History:  Procedure Laterality Date  . COLON SURGERY    . CYSTOSCOPY WITH LITHOLAPAXY N/A 11/14/2016   Procedure: CYSTOSCOPY WITH LITHOLAPAXY;  Surgeon: Hollice Espy, MD;  Location: [facility] ORS;  Service: Urology;  Laterality: N/A;  . EYE SURGERY Bilateral    Cataract Extraction with IOL  . HERNIA REPAIR  20 years  . MOHS SURGERY    . SMALL INTESTINE SURGERY     Per patient 7 years  . TRANSURETHRAL RESECTION OF PROSTATE N/A 11/14/2016   Procedure: TRANSURETHRAL RESECTION OF THE PROSTATE (TURP);  Surgeon: Hollice Espy, MD;  Location: [facility] ORS;  Service: Urology;  Laterality: N/A;    There were no vitals filed for this visit.  Subjective Assessment - 07/20/18 0906    Subjective   Patient reports he had a good weekend, didn't do much but was able to do some exercises each day.     Pertinent History  Patient reports he was diagnosed with Parkinson's disease about 10 plus years ago.  He reports  a more recent decline in function in his daily activities in the last 6-12 months.  He has been seeing PT for the last couple months.    Patient Stated Goals  Patient reports he wants to be able to do more for himself and around the house.     Currently in Pain?  No/denies    Pain Score  0-No pain    Multiple Pain Sites  No                   OT Treatments/Exercises (OP) - 07/20/18 0914      ADLs   ADL Comments  shoe tying with crossed leg method for multiple trials.  Sit to stand from low surface with cues for body positioning.        Neurological Re-education Exercises   Other Exercises 1  Patient seen for instruction of LSVT BIG exercises: LSVT Daily Session Maximal Daily Exercises: Sustained movements are designed to rescale the amplitude of movement output for generalization to daily functional activities. Performed as follows for 1 set of 10 repetitions each: Multi directional sustained movements- 1) Floor to ceiling, 2) Side to side. Multi directional Repetitive movements performed in standing and are designed to provide retraining  effort needed for sustained muscle activation in tasks Performed as follows: 3) Step and reach forward, 4) Step and Reach Backwards, 5) Step and reach sideways, 6) Rock and reach forward/backward, 7) Rock and reach sideways.  CGA for exercises in standing for exercises , verbal and tactile cues to complete with correct form and arm   placements.  Postural exercises with manual stretch by therapist to pectoralis muscles followed by AROM with cues in sitting and standing.      Other Exercises 2  Focus on crossing thresholds with minimal freezing of gait.  Performed repeated to diminish stimulus.  Patient responds well to cues provided.               OT Education - 07/20/18 [phone]    Education provided  Yes    Education Details  turning in small spaces, navigating in crowded areas.     Person(s) Educated  Patient    Methods   Demonstration;Explanation;Verbal cues;Tactile cues    Comprehension  Verbal cues required;Returned demonstration;Verbalized understanding;Tactile cues required          OT Long Term Goals - 06/09/18 1504      OT LONG TERM GOAL #1   Title  Patient will demonstrate increase right hand grip to be able to cut food with modified independence.     Baseline  occasional assist required.    Time  12    Period  Weeks    Status  Partially Met      OT LONG TERM GOAL #2   Title  Patient will complete donning long sleeve shirt with modified independence.    Baseline  requires assist with long sleeves, can perform short sleeves.    Time  12    Period  Weeks    Status  Achieved      OT LONG TERM GOAL #3   Title  Patient will improve coordination to manage buttons on clothing with occasional assistance only.    Baseline  requires assist with buttons each time    Time  12    Period  Weeks    Status  Partially Met      OT LONG TERM GOAL #4   Title  Patient will demonstrate upright posture with shoulders back and head up during exercises with minimal cues.      Baseline  moderate cues for positioning during exercises.     Time  12    Period  Weeks    Status  New      OT LONG TERM GOAL #5   Title  Patient will demonstrate turning behaviors with rollator using large steps and keeping feet behind the rollator with all turns with occasional minimal cues.     Time  12    Period  Weeks    Status  On-going      OT LONG TERM GOAL #6   Title  Patient will improve gait speed and endurance and be able to walk 950 feet in 6 minutes to negotiate around the home and community safely in 4 weeks    Baseline  860 at reassessment, 390 at recert 7/8    Time  4    Period  Weeks    Status  On-going      OT LONG TERM GOAL #7   Title  Patient will complete HEP for maximal daily exercises with modified independence in 4 weeks    Time  12    Period  Weeks    Status  Partially Met      OT LONG TERM GOAL  #8   Title  Patient will transfer from sit to stand without the use of arms safely and independently from a variety of chairs/surfaces in 4 weeks.     Baseline  difficulty from lower surfaces    Time  4    Period  Weeks    Status  Achieved      OT LONG TERM GOAL  #9   Baseline  Patient will demonstrate decreased episodes of freezing of behaviors from score of 16 to score of less than 12.    Time  4    Period  Weeks    Status  Achieved            Plan - 07/20/18 0908    Clinical Impression Statement  Patient reports continued issues with freezing of gait at home, he is continuing to track to track when and where this happens and it appears it is most often when coming out of his bedroom, crossing the threshold.  When demonstrating freezing, he reports rigidity and then increased muscle weakness and fatigue after it occurs. We focused on repeated threshold crossing this date, forwards and backwards and worked on strategies for initiation of movement.  Patient requires verbal cues and prompts for implementing strategies. Continue to focus on these areas to diminish freezing of gait behaviors.     Occupational Profile and client history currently impacting functional performance  progressive disease process, freezing of gait behaviors, fall risk    Occupational performance deficits (Please refer to evaluation for details):  ADL's;IADL's;Leisure    Rehab Potential  Good    Current Impairments/barriers affecting progress:  positive: family support, negative: level of motivation, progressive disease    OT Frequency  2x / week    OT Duration  12 weeks    OT Treatment/Interventions  Self-care/ADL training;Moist Heat;DME and/or AE instruction;Balance training;Therapeutic activities;Therapeutic exercise;Neuromuscular education;Functional Mobility Training;Patient/family education    Consulted and Agree with Plan of Care  Patient       Patient will benefit from skilled therapeutic intervention  in order to improve the following deficits and impairments:  Abnormal gait, Decreased coordination, Decreased range of motion, Difficulty walking, Decreased endurance, Decreased safety awareness, Decreased activity tolerance, Decreased balance, Impaired UE functional use, Pain, Decreased mobility, Decreased strength  Visit Diagnosis: Unsteadiness on feet  Abnormal posture  Difficulty in walking, not elsewhere classified  Muscle weakness (generalized)  Other lack of coordination    Problem List Patient Active Problem List   Diagnosis Date Noted  . Dementia due to Parkinson's disease without behavioral disturbance ([place]) 07/18/2017  . Neurogenic orthostatic hypotension ([place]) 12/02/2016  . BPH with urinary obstruction 11/14/2016  . Urinary frequency 10/10/2016  . Microscopic hematuria 10/10/2016  . Parkinson's syndrome ([place]) 06/14/2016  . Arthritis 06/14/2016  . Depression 06/14/2016  . Heartburn 06/14/2016  . Urinary incontinence 06/14/2016  . Skin lesion 06/14/2016  . Dementia 06/14/2016  . Bilateral edema of lower extremity 06/14/2016   Achilles Dunk, OTR/L, CLT  Dereona Kolodny 07/20/2018, 9:16 AM  [place] MAIN [facility] SERVICES [address] [place], [place], [address] Phone: [phone]   Fax:  [phone]  Name: Luke Durham MRN: [id number] Date of Birth: 25-Jul-1933

## 2018-07-20 NOTE — Therapy (Signed)
[place] MAIN [facility] SERVICES [address] [place], [place], [address] Phone: [phone]   Fax:  [phone]  Occupational Therapy Treatment/Progress Update Reporting period from 06/08/2018 to 07/17/2018   Patient Details  Name: Luke Durham MRN: [id number] Date of Birth: Nov 16, 1932 No data recorded  Encounter Date: 07/17/2018  OT End of Session - 07/20/18 8588    Visit Number  140    Number of Visits  156    Date for OT Re-Evaluation  07/23/18    Authorization Type  Medicare visit 10 of 10    OT Start Time  1400    OT Stop Time  1455    OT Time Calculation (min)  55 min    Activity Tolerance  Patient tolerated treatment well    Behavior During Therapy  [facility] for tasks assessed/performed       Past Medical History:  Diagnosis Date  . Anxiety   . Arthritis   . Asthma    childhood asthma  . Cancer ([place]) 7 years ago   lymphoma, [place] (recent)  . Chronic kidney disease   . Colon polyps   . GERD (gastroesophageal reflux disease)   . History of kidney stones   . Parkinson disease [facility])     Past Surgical History:  Procedure Laterality Date  . COLON SURGERY    . CYSTOSCOPY WITH LITHOLAPAXY N/A 11/14/2016   Procedure: CYSTOSCOPY WITH LITHOLAPAXY;  Surgeon: Hollice Espy, MD;  Location: [facility] ORS;  Service: Urology;  Laterality: N/A;  . EYE SURGERY Bilateral    Cataract Extraction with IOL  . HERNIA REPAIR  20 years  . MOHS SURGERY    . SMALL INTESTINE SURGERY     Per patient 7 years  . TRANSURETHRAL RESECTION OF PROSTATE N/A 11/14/2016   Procedure: TRANSURETHRAL RESECTION OF THE PROSTATE (TURP);  Surgeon: Hollice Espy, MD;  Location: [facility] ORS;  Service: Urology;  Laterality: N/A;    There were no vitals filed for this visit.  Subjective Assessment - 07/20/18 0917    Subjective   Patient reports he has had pain at times especially in his muscles after any freezing episode but denies any pain at the moment. Patient reports he went  to 67th high school reunion and surprisingly he was able to move around better than most people there his age.     Pertinent History  Patient reports he was diagnosed with Parkinson's disease about 10 plus years ago.  He reports a more recent decline in function in his daily activities in the last 6-12 months.  He has been seeing PT for the last couple months.    Patient Stated Goals  Patient reports he wants to be able to do more for himself and around the house.     Currently in Pain?  No/denies    Pain Score  0-No pain    Multiple Pain Sites  No                   OT Treatments/Exercises (OP) - 07/20/18 5027      Neurological Re-education Exercises   Other Exercises 1  Patient seen for instruction of LSVT BIG exercises: LSVT Daily Session Maximal Daily Exercises: Sustained movements are designed to rescale the amplitude of movement output for generalization to daily functional activities. Performed as follows for 1 set of 10 repetitions each: Multi directional sustained movements- 1) Floor to ceiling, 2) Side to side. Multi directional Repetitive movements performed in standing and are designed to  provide retraining effort needed for sustained muscle activation in tasks Performed as follows: 3) Step and reach forward, 4) Step and Reach Backwards, 5) Step and reach sideways, 6) Rock and reach forward/backward, 7) Rock and reach sideways.  CGA for exercises in standing for exercises , verbal and tactile cues to complete with correct form and arm   placements.    Other Exercises 2  6 minute walk test, 895 feet. 5 times sit to stand 16 sec.  Postural exercises at the wall with assist from therapist for manual stretch for pec muscles, cues for head position, shoulder retraction.  Ball exercises standing at the wall for 10 reps for 2 sets each, cues for continued emphasis on posture during exercise.              OT Education - 07/20/18 [phone]    Education provided  Yes    Education  Details  posture    Person(s) Educated  Patient    Methods  Demonstration;Explanation;Verbal cues;Tactile cues    Comprehension  Verbal cues required;Returned demonstration;Verbalized understanding;Tactile cues required          OT Long Term Goals - 07/20/18 0923      OT LONG TERM GOAL #1   Title  Patient will demonstrate increase right hand grip to be able to cut food with modified independence.     Baseline  occasional assist required.    Time  12    Period  Weeks    Status  Partially Met      OT LONG TERM GOAL #2   Title  Patient will complete donning long sleeve shirt with modified independence.    Baseline  requires assist with long sleeves, can perform short sleeves.    Time  12    Period  Weeks    Status  Achieved      OT LONG TERM GOAL #3   Title  Patient will improve coordination to manage buttons on clothing with occasional assistance only.    Baseline  requires assist with buttons each time    Time  12    Period  Weeks    Status  Partially Met      OT LONG TERM GOAL #4   Title  Patient will demonstrate upright posture with shoulders back and head up during exercises with minimal cues.      Baseline  moderate cues for positioning during exercises.     Time  12    Period  Weeks    Status  On-going      OT LONG TERM GOAL #5   Title  Patient will demonstrate turning behaviors with rollator using large steps and keeping feet behind the rollator with all turns with occasional minimal cues.     Time  12    Period  Weeks    Status  On-going      OT LONG TERM GOAL #6   Title  Patient will improve gait speed and endurance and be able to walk 950 feet in 6 minutes to negotiate around the home and community safely in 12 weeks    Baseline  860 at reassessment, 935 at recert 5/21    Time  4    Period  Weeks    Status  On-going      OT LONG TERM GOAL #7   Title  Patient will complete HEP for maximal daily exercises with modified independence in 4 weeks    Time  12  Period  Weeks    Status  Partially Met      OT LONG TERM GOAL #8   Title  Patient will transfer from sit to stand without the use of arms safely and independently from a variety of chairs/surfaces in 4 weeks.     Baseline  difficulty from lower surfaces    Time  4    Period  Weeks    Status  Achieved      OT LONG TERM GOAL  #9   Baseline  Patient will demonstrate decreased episodes of freezing of behaviors from score of 16 to score of less than 12.    Time  4    Period  Weeks    Status  Achieved            Plan - 07/20/18 9432    Clinical Impression Statement  Patient has continued  to make progress in all areas, slight increase in 6 minute walk distance to 895 feet but still shy of goal of 950 feet.  He does report issues with freezing of gait which tends to occur more at home.  He has demonstrated minimal freezing in the clinic setting and tends to be noted more when he is rushed or in a timed situation.  We have continued to work on strategies to help with initiation of movement to diminish freezing behaviors.  He tends to freez more when crossing the threshold of his bedroom at home.  He would continue to benefit from skilled OT to maximize safety and independence in daily tasks.      Occupational Profile and client history currently impacting functional performance  progressive disease process, freezing of gait behaviors, fall risk    Occupational performance deficits (Please refer to evaluation for details):  ADL's;IADL's;Leisure    Rehab Potential  Good    Current Impairments/barriers affecting progress:  positive: family support, negative: level of motivation, progressive disease    OT Frequency  2x / week    OT Duration  12 weeks    OT Treatment/Interventions  Self-care/ADL training;Moist Heat;DME and/or AE instruction;Balance training;Therapeutic activities;Therapeutic exercise;Neuromuscular education;Functional Mobility Training;Patient/family education    Consulted and  Agree with Plan of Care  Patient       Patient will benefit from skilled therapeutic intervention in order to improve the following deficits and impairments:  Abnormal gait, Decreased coordination, Decreased range of motion, Difficulty walking, Decreased endurance, Decreased safety awareness, Decreased activity tolerance, Decreased balance, Impaired UE functional use, Pain, Decreased mobility, Decreased strength  Visit Diagnosis: Unsteadiness on feet  Abnormal posture  Difficulty in walking, not elsewhere classified  Muscle weakness (generalized)  Other lack of coordination    Problem List Patient Active Problem List   Diagnosis Date Noted  . Dementia due to Parkinson's disease without behavioral disturbance ([place]) 07/18/2017  . Neurogenic orthostatic hypotension ([place]) 12/02/2016  . BPH with urinary obstruction 11/14/2016  . Urinary frequency 10/10/2016  . Microscopic hematuria 10/10/2016  . Parkinson's syndrome ([place]) 06/14/2016  . Arthritis 06/14/2016  . Depression 06/14/2016  . Heartburn 06/14/2016  . Urinary incontinence 06/14/2016  . Skin lesion 06/14/2016  . Dementia 06/14/2016  . Bilateral edema of lower extremity 06/14/2016   Achilles Dunk, OTR/L, CLT  Lovett,Amy 07/20/2018, 9:30 AM  [place] MAIN [facility] SERVICES [address] [place], [place], [address] Phone: [phone]   Fax:  [phone]  Name: Luke Durham MRN: [id number] Date of Birth: [birth year]

## 2018-07-21 NOTE — Therapy (Signed)
[place] MAIN [facility] SERVICES [address] [place], [place], [address] Phone: [phone]   Fax:  [phone]  Occupational Therapy Treatment  Patient Details  Name: Luke Durham MRN: [id number] Date of Birth: Jul 01, 1933 No data recorded  Encounter Date: 07/20/2018    Past Medical History:  Diagnosis Date  . Anxiety   . Arthritis   . Asthma    childhood asthma  . Cancer ([place]) 7 years ago   lymphoma, [place] (recent)  . Chronic kidney disease   . Colon polyps   . GERD (gastroesophageal reflux disease)   . History of kidney stones   . Parkinson disease [facility])     Past Surgical History:  Procedure Laterality Date  . COLON SURGERY    . CYSTOSCOPY WITH LITHOLAPAXY N/A 11/14/2016   Procedure: CYSTOSCOPY WITH LITHOLAPAXY;  Surgeon: Hollice Espy, MD;  Location: [facility] ORS;  Service: Urology;  Laterality: N/A;  . EYE SURGERY Bilateral    Cataract Extraction with IOL  . HERNIA REPAIR  20 years  . MOHS SURGERY    . SMALL INTESTINE SURGERY     Per patient 7 years  . TRANSURETHRAL RESECTION OF PROSTATE N/A 11/14/2016   Procedure: TRANSURETHRAL RESECTION OF THE PROSTATE (TURP);  Surgeon: Hollice Espy, MD;  Location: [facility] ORS;  Service: Urology;  Laterality: N/A;    There were no vitals filed for this visit.                             OT Long Term Goals - 07/20/18 2725      OT LONG TERM GOAL #1   Title  Patient will demonstrate increase right hand grip to be able to cut food with modified independence.     Baseline  occasional assist required.    Time  12    Period  Weeks    Status  Partially Met      OT LONG TERM GOAL #2   Title  Patient will complete donning long sleeve shirt with modified independence.    Baseline  requires assist with long sleeves, can perform short sleeves.    Time  12    Period  Weeks    Status  Achieved      OT LONG TERM GOAL #3   Title  Patient will improve coordination to manage  buttons on clothing with occasional assistance only.    Baseline  requires assist with buttons each time    Time  12    Period  Weeks    Status  Partially Met      OT LONG TERM GOAL #4   Title  Patient will demonstrate upright posture with shoulders back and head up during exercises with minimal cues.      Baseline  moderate cues for positioning during exercises.     Time  12    Period  Weeks    Status  On-going      OT LONG TERM GOAL #5   Title  Patient will demonstrate turning behaviors with rollator using large steps and keeping feet behind the rollator with all turns with occasional minimal cues.     Time  12    Period  Weeks    Status  On-going      OT LONG TERM GOAL #6   Title  Patient will improve gait speed and endurance and be able to walk 950 feet in 6 minutes to negotiate around the  home and community safely in 12 weeks    Baseline  860 at reassessment, 867 at recert 7/37    Time  4    Period  Weeks    Status  On-going      OT LONG TERM GOAL #7   Title  Patient will complete HEP for maximal daily exercises with modified independence in 4 weeks    Time  12    Period  Weeks    Status  Partially Met      OT LONG TERM GOAL #8   Title  Patient will transfer from sit to stand without the use of arms safely and independently from a variety of chairs/surfaces in 4 weeks.     Baseline  difficulty from lower surfaces    Time  4    Period  Weeks    Status  Achieved      OT LONG TERM GOAL  #9   Baseline  Patient will demonstrate decreased episodes of freezing of behaviors from score of 16 to score of less than 12.    Time  4    Period  Weeks    Status  Achieved              Patient will benefit from skilled therapeutic intervention in order to improve the following deficits and impairments:     Visit Diagnosis: Unsteadiness on feet  Abnormal posture  Difficulty in walking, not elsewhere classified  Muscle weakness (generalized)  Other lack of  coordination    Problem List Patient Active Problem List   Diagnosis Date Noted  . Dementia due to Parkinson's disease without behavioral disturbance ([place]) 07/18/2017  . Neurogenic orthostatic hypotension ([place]) 12/02/2016  . BPH with urinary obstruction 11/14/2016  . Urinary frequency 10/10/2016  . Microscopic hematuria 10/10/2016  . Parkinson's syndrome ([place]) 06/14/2016  . Arthritis 06/14/2016  . Depression 06/14/2016  . Heartburn 06/14/2016  . Urinary incontinence 06/14/2016  . Skin lesion 06/14/2016  . Dementia 06/14/2016  . Bilateral edema of lower extremity 06/14/2016   Achilles Dunk, OTR/L, CLT Lovett,Amy 07/21/2018, 7:29 PM  [place] MAIN [facility] SERVICES [address] [place], [place], [address] Phone: [phone]   Fax:  [phone]  Name: Luke Durham MRN: [id number] Date of Birth: 23-May-1933

## 2018-07-22 ENCOUNTER — Encounter: Payer: Self-pay | Admitting: Occupational Therapy

## 2018-07-22 NOTE — Therapy (Signed)
[place] MAIN [facility] SERVICES [address] [place], [place], [address] Phone: [phone]   Fax:  [phone]  Occupational Therapy Treatment  Patient Details  Name: Luke Durham MRN: [id number] Date of Birth: Aug 04, 1933 No data recorded  Encounter Date: 07/20/2018  OT End of Session - 07/22/18 0939    Visit Number  141    Number of Visits  156    Date for OT Re-Evaluation  10/12/18    Authorization Type  Medicare visit 1 of 10    OT Start Time  1000    OT Stop Time  1101    OT Time Calculation (min)  61 min    Activity Tolerance  Patient tolerated treatment well    Behavior During Therapy  [facility] for tasks assessed/performed       Past Medical History:  Diagnosis Date  . Anxiety   . Arthritis   . Asthma    childhood asthma  . Cancer ([place]) 7 years ago   lymphoma, [place] (recent)  . Chronic kidney disease   . Colon polyps   . GERD (gastroesophageal reflux disease)   . History of kidney stones   . Parkinson disease [facility])     Past Surgical History:  Procedure Laterality Date  . COLON SURGERY    . CYSTOSCOPY WITH LITHOLAPAXY N/A 11/14/2016   Procedure: CYSTOSCOPY WITH LITHOLAPAXY;  Surgeon: Hollice Espy, MD;  Location: [facility] ORS;  Service: Urology;  Laterality: N/A;  . EYE SURGERY Bilateral    Cataract Extraction with IOL  . HERNIA REPAIR  20 years  . MOHS SURGERY    . SMALL INTESTINE SURGERY     Per patient 7 years  . TRANSURETHRAL RESECTION OF PROSTATE N/A 11/14/2016   Procedure: TRANSURETHRAL RESECTION OF THE PROSTATE (TURP);  Surgeon: Hollice Espy, MD;  Location: [facility] ORS;  Service: Urology;  Laterality: N/A;    There were no vitals filed for this visit.  Subjective Assessment - 07/22/18 0934    Subjective   Patient reports he is doing well, would like to work more on balance today.     Pertinent History  Patient reports he was diagnosed with Parkinson's disease about 10 plus years ago.  He reports a more recent decline  in function in his daily activities in the last 6-12 months.  He has been seeing PT for the last couple months.    Patient Stated Goals  Patient reports he wants to be able to do more for himself and around the house.     Currently in Pain?  No/denies    Pain Score  0-No pain                   OT Treatments/Exercises (OP) - 07/22/18 0935      Neurological Re-education Exercises   Other Exercises 1  Patient seen for instruction of LSVT BIG exercises: LSVT Daily Session Maximal Daily Exercises: Sustained movements are designed to rescale the amplitude of movement output for generalization to daily functional activities. Performed as follows for 1 set of 10 repetitions each: Multi directional sustained movements- 1) Floor to ceiling, 2) Side to side. Multi directional Repetitive movements performed in standing and are designed to provide retraining effort needed for sustained muscle activation in tasks Performed as follows: 3) Step and reach forward, 4) Step and Reach Backwards, 5) Step and reach sideways, 6) Rock and reach forward/backward, 7) Rock and reach sideways.  CGA for exercises in standing for exercises , verbal and  tactile cues to complete with correct form and arm   placements.    Other Exercises 2  Patient performing sit to stand for 10 repetitions from chair with supervision.  Crossed leg method to reach shoes for tying, 5 reps each.  Posture exercises with manual stretch in sitting followed by cues for erect posture, head position.  Balance tasks standing on balance pad while reaching and placing items from shape tower one level to the next for 3 levels and back.  CGA for balance and occasional cues for weight shift and posture.  Min assist to step onto and off of balance pad.              OT Education - 07/22/18 [id number]    Education provided  Yes    Education Details  balance tasks, posture    Person(s) Educated  Patient    Methods  Demonstration;Explanation;Verbal  cues;Tactile cues    Comprehension  Verbal cues required;Returned demonstration;Verbalized understanding;Tactile cues required          OT Long Term Goals - 07/20/18 0923      OT LONG TERM GOAL #1   Title  Patient will demonstrate increase right hand grip to be able to cut food with modified independence.     Baseline  occasional assist required.    Time  12    Period  Weeks    Status  Partially Met      OT LONG TERM GOAL #2   Title  Patient will complete donning long sleeve shirt with modified independence.    Baseline  requires assist with long sleeves, can perform short sleeves.    Time  12    Period  Weeks    Status  Achieved      OT LONG TERM GOAL #3   Title  Patient will improve coordination to manage buttons on clothing with occasional assistance only.    Baseline  requires assist with buttons each time    Time  12    Period  Weeks    Status  Partially Met      OT LONG TERM GOAL #4   Title  Patient will demonstrate upright posture with shoulders back and head up during exercises with minimal cues.      Baseline  moderate cues for positioning during exercises.     Time  12    Period  Weeks    Status  On-going      OT LONG TERM GOAL #5   Title  Patient will demonstrate turning behaviors with rollator using large steps and keeping feet behind the rollator with all turns with occasional minimal cues.     Time  12    Period  Weeks    Status  On-going      OT LONG TERM GOAL #6   Title  Patient will improve gait speed and endurance and be able to walk 950 feet in 6 minutes to negotiate around the home and community safely in 12 weeks    Baseline  860 at reassessment, 211 at recert 9/41    Time  4    Period  Weeks    Status  On-going      OT LONG TERM GOAL #7   Title  Patient will complete HEP for maximal daily exercises with modified independence in 4 weeks    Time  12    Period  Weeks    Status  Partially Met      OT LONG  TERM GOAL #8   Title  Patient will  transfer from sit to stand without the use of arms safely and independently from a variety of chairs/surfaces in 4 weeks.     Baseline  difficulty from lower surfaces    Time  4    Period  Weeks    Status  Achieved      OT LONG TERM GOAL  #9   Baseline  Patient will demonstrate decreased episodes of freezing of behaviors from score of 16 to score of less than 12.    Time  4    Period  Weeks    Status  Achieved            Plan - 07/22/18 0940    Clinical Impression Statement  Patient seen for focus on balance this date with use of balance pad and incorporating reach to challenge weight shift in multidirections. Patient requires repeated cues for posture, patient is aware but has a difficult time with being consistent with keeping upright posture during exercises/tasks.  Continue to focus on balance, functional mobility, safety and posture to reduce the risk of falls.     Occupational Profile and client history currently impacting functional performance  progressive disease process, freezing of gait behaviors, fall risk    Occupational performance deficits (Please refer to evaluation for details):  ADL's;IADL's;Leisure    Rehab Potential  Good    Current Impairments/barriers affecting progress:  positive: family support, negative: level of motivation, progressive disease    OT Frequency  2x / week    OT Duration  12 weeks    OT Treatment/Interventions  Self-care/ADL training;Moist Heat;DME and/or AE instruction;Balance training;Therapeutic activities;Therapeutic exercise;Neuromuscular education;Functional Mobility Training;Patient/family education    Consulted and Agree with Plan of Care  Patient       Patient will benefit from skilled therapeutic intervention in order to improve the following deficits and impairments:  Abnormal gait, Decreased coordination, Decreased range of motion, Difficulty walking, Decreased endurance, Decreased safety awareness, Decreased activity tolerance,  Decreased balance, Impaired UE functional use, Pain, Decreased mobility, Decreased strength  Visit Diagnosis: Unsteadiness on feet  Abnormal posture  Difficulty in walking, not elsewhere classified  Muscle weakness (generalized)  Other lack of coordination    Problem List Patient Active Problem List   Diagnosis Date Noted  . Dementia due to Parkinson's disease without behavioral disturbance ([place]) 07/18/2017  . Neurogenic orthostatic hypotension ([place]) 12/02/2016  . BPH with urinary obstruction 11/14/2016  . Urinary frequency 10/10/2016  . Microscopic hematuria 10/10/2016  . Parkinson's syndrome ([place]) 06/14/2016  . Arthritis 06/14/2016  . Depression 06/14/2016  . Heartburn 06/14/2016  . Urinary incontinence 06/14/2016  . Skin lesion 06/14/2016  . Dementia 06/14/2016  . Bilateral edema of lower extremity 06/14/2016    Lovett,Amy 07/22/2018, 9:48 AM  [place] MAIN [facility] SERVICES [address] [place], [place], [address] Phone: [phone]   Fax:  [phone]  Name: Atreus Hasz MRN: [id number] Date of Birth: [birth year]

## 2018-07-23 ENCOUNTER — Encounter: Payer: Medicare Other | Admitting: Occupational Therapy

## 2018-07-24 ENCOUNTER — Ambulatory Visit: Payer: Medicare Other | Attending: Physician Assistant | Admitting: Occupational Therapy

## 2018-07-24 ENCOUNTER — Encounter: Payer: Self-pay | Admitting: Occupational Therapy

## 2018-07-24 DIAGNOSIS — R2681 Unsteadiness on feet: Secondary | ICD-10-CM | POA: Diagnosis not present

## 2018-07-24 DIAGNOSIS — M25511 Pain in right shoulder: Secondary | ICD-10-CM | POA: Insufficient documentation

## 2018-07-24 DIAGNOSIS — G8929 Other chronic pain: Secondary | ICD-10-CM | POA: Insufficient documentation

## 2018-07-24 DIAGNOSIS — M6281 Muscle weakness (generalized): Secondary | ICD-10-CM | POA: Diagnosis present

## 2018-07-24 DIAGNOSIS — R278 Other lack of coordination: Secondary | ICD-10-CM

## 2018-07-24 DIAGNOSIS — R262 Difficulty in walking, not elsewhere classified: Secondary | ICD-10-CM

## 2018-07-24 DIAGNOSIS — R293 Abnormal posture: Secondary | ICD-10-CM | POA: Insufficient documentation

## 2018-07-24 NOTE — Therapy (Signed)
[place] MAIN [facility] SERVICES [address] [place], [place], [address] Phone: [phone]   Fax:  [phone]  Occupational Therapy Treatment  Patient Details  Name: Luke Durham MRN: [id number] Date of Birth: July 12, 1933 No data recorded  Encounter Date: 07/24/2018  OT End of Session - 07/26/18 1617    Visit Number  142    Number of Visits  156    Date for OT Re-Evaluation  10/12/18    Authorization Type  Medicare visit 2 of 10    OT Start Time  1304    OT Stop Time  1403    OT Time Calculation (min)  59 min    Activity Tolerance  Patient tolerated treatment well    Behavior During Therapy  [facility] for tasks assessed/performed       Past Medical History:  Diagnosis Date  . Anxiety   . Arthritis   . Asthma    childhood asthma  . Cancer ([place]) 7 years ago   lymphoma, [place] (recent)  . Chronic kidney disease   . Colon polyps   . GERD (gastroesophageal reflux disease)   . History of kidney stones   . Parkinson disease [facility])     Past Surgical History:  Procedure Laterality Date  . COLON SURGERY    . CYSTOSCOPY WITH LITHOLAPAXY N/A 11/14/2016   Procedure: CYSTOSCOPY WITH LITHOLAPAXY;  Surgeon: Hollice Espy, MD;  Location: [facility] ORS;  Service: Urology;  Laterality: N/A;  . EYE SURGERY Bilateral    Cataract Extraction with IOL  . HERNIA REPAIR  20 years  . MOHS SURGERY    . SMALL INTESTINE SURGERY     Per patient 7 years  . TRANSURETHRAL RESECTION OF PROSTATE N/A 11/14/2016   Procedure: TRANSURETHRAL RESECTION OF THE PROSTATE (TURP);  Surgeon: Hollice Espy, MD;  Location: [facility] ORS;  Service: Urology;  Laterality: N/A;    There were no vitals filed for this visit.  Subjective Assessment - 07/26/18 1617    Subjective   Patient reports he is bothered by the fact he cannot recall what he did this am.  Wife reports it was a slow morning to get a shower and get dressed.     Pertinent History  Patient reports he was diagnosed with  Parkinson's disease about 10 plus years ago.  He reports a more recent decline in function in his daily activities in the last 6-12 months.  He has been seeing PT for the last couple months.    Patient Stated Goals  Patient reports he wants to be able to do more for himself and around the house.     Currently in Pain?  Yes    Pain Score  5     Pain Location  Shoulder    Pain Orientation  Right    Pain Descriptors / Indicators  Aching    Pain Type  Chronic pain    Pain Onset  More than a month ago    Pain Frequency  Intermittent                   OT Treatments/Exercises (OP) - 07/26/18 1621      Neurological Re-education Exercises   Other Exercises 1  Patient seen for instruction of LSVT BIG exercises: LSVT Daily Session Maximal Daily Exercises: Sustained movements are designed to rescale the amplitude of movement output for generalization to daily functional activities. Performed as follows for 1 set of 10 repetitions each: Multi directional sustained movements- 1) Floor  to ceiling, 2) Side to side. Multi directional Repetitive movements performed in standing and are designed to provide retraining effort needed for sustained muscle activation in tasks Performed as follows: 3) Step and reach forward, 4) Step and Reach Backwards, 5) Step and reach sideways, 6) Rock and reach forward/backward, 7) Rock and reach sideways.  CGA for exercises in standing for exercises , verbal and tactile cues to complete with correct form and arm   placements.    Other Exercises 2  Patient performing sit to stand for 10 repetitions from chair with supervision.  Crossed leg method to reach shoes for tying, 5 reps each.  Posture exercises with manual stretch in sitting followed by cues for erect posture, head position.Functional mobility with rollator with focus on turns in smaller spaces, cues for positioning of self behind the rollator and to increase amplitude of steps to position self. Navigating in crowded  spaces around increased activity and people and cues to keep moving and not to hesitate which leads to freezing of gait             OT Education - 07/26/18 1617    Education provided  Yes    Education Details  stepping patterns while in seated position    Person(s) Educated  Patient    Methods  Demonstration;Explanation;Verbal cues;Tactile cues    Comprehension  Verbal cues required;Returned demonstration;Verbalized understanding;Tactile cues required          OT Long Term Goals - 07/20/18 0923      OT LONG TERM GOAL #1   Title  Patient will demonstrate increase right hand grip to be able to cut food with modified independence.     Baseline  occasional assist required.    Time  12    Period  Weeks    Status  Partially Met      OT LONG TERM GOAL #2   Title  Patient will complete donning long sleeve shirt with modified independence.    Baseline  requires assist with long sleeves, can perform short sleeves.    Time  12    Period  Weeks    Status  Achieved      OT LONG TERM GOAL #3   Title  Patient will improve coordination to manage buttons on clothing with occasional assistance only.    Baseline  requires assist with buttons each time    Time  12    Period  Weeks    Status  Partially Met      OT LONG TERM GOAL #4   Title  Patient will demonstrate upright posture with shoulders back and head up during exercises with minimal cues.      Baseline  moderate cues for positioning during exercises.     Time  12    Period  Weeks    Status  On-going      OT LONG TERM GOAL #5   Title  Patient will demonstrate turning behaviors with rollator using large steps and keeping feet behind the rollator with all turns with occasional minimal cues.     Time  12    Period  Weeks    Status  On-going      OT LONG TERM GOAL #6   Title  Patient will improve gait speed and endurance and be able to walk 950 feet in 6 minutes to negotiate around the home and community safely in 12 weeks     Baseline  860 at reassessment, 160 at recert 1/09  Time  4    Period  Weeks    Status  On-going      OT LONG TERM GOAL #7   Title  Patient will complete HEP for maximal daily exercises with modified independence in 4 weeks    Time  12    Period  Weeks    Status  Partially Met      OT LONG TERM GOAL #8   Title  Patient will transfer from sit to stand without the use of arms safely and independently from a variety of chairs/surfaces in 4 weeks.     Baseline  difficulty from lower surfaces    Time  4    Period  Weeks    Status  Achieved      OT LONG TERM GOAL  #9   Baseline  Patient will demonstrate decreased episodes of freezing of behaviors from score of 16 to score of less than 12.    Time  4    Period  Weeks    Status  Achieved            Plan - 07/26/18 1617    Clinical Impression Statement  Patient with shoulder pain at the beginnning of session today, moist heat prior to any exercises involving arms.  Performed seated stepping exercises, front, back and side to side with heat to shoulder, cues for posture to keep upright.  Patient's wife reports patient has been slow to move today.  Patient continues to work towards progressing with daily exercises to perform in a standard form versus adapted version.  Focused on functional mobility with turns in small spaces, moving around in crowded spaces with Min guard and use of rollator, cues to keep moving rather than hesitating which leads to increased freezing of gait.     Occupational Profile and client history currently impacting functional performance  progressive disease process, freezing of gait behaviors, fall risk    Occupational performance deficits (Please refer to evaluation for details):  ADL's;IADL's;Leisure    Rehab Potential  Good    Current Impairments/barriers affecting progress:  positive: family support, negative: level of motivation, progressive disease    OT Frequency  2x / week    OT Duration  12 weeks     OT Treatment/Interventions  Self-care/ADL training;Moist Heat;DME and/or AE instruction;Balance training;Therapeutic activities;Therapeutic exercise;Neuromuscular education;Functional Mobility Training;Patient/family education    Consulted and Agree with Plan of Care  Patient       Patient will benefit from skilled therapeutic intervention in order to improve the following deficits and impairments:  Abnormal gait, Decreased coordination, Decreased range of motion, Difficulty walking, Decreased endurance, Decreased safety awareness, Decreased activity tolerance, Decreased balance, Impaired UE functional use, Pain, Decreased mobility, Decreased strength  Visit Diagnosis: Unsteadiness on feet  Abnormal posture  Difficulty in walking, not elsewhere classified  Muscle weakness (generalized)  Other lack of coordination  Chronic right shoulder pain    Problem List Patient Active Problem List   Diagnosis Date Noted  . Dementia due to Parkinson's disease without behavioral disturbance ([place]) 07/18/2017  . Neurogenic orthostatic hypotension ([place]) 12/02/2016  . BPH with urinary obstruction 11/14/2016  . Urinary frequency 10/10/2016  . Microscopic hematuria 10/10/2016  . Parkinson's syndrome ([place]) 06/14/2016  . Arthritis 06/14/2016  . Depression 06/14/2016  . Heartburn 06/14/2016  . Urinary incontinence 06/14/2016  . Skin lesion 06/14/2016  . Dementia ([place]) 06/14/2016  . Bilateral edema of lower extremity 06/14/2016   Amy T Lovett, OTR/L, CLT  Lovett,Amy 07/26/2018, 4:24  PM  [place] MAIN [facility] SERVICES [address] [place], [place], [address] Phone: [phone]   Fax:  [phone]  Name: Roma Bierlein MRN: [id number] Date of Birth: 07/16/1933

## 2018-07-27 ENCOUNTER — Ambulatory Visit: Payer: Medicare Other | Admitting: Occupational Therapy

## 2018-07-27 DIAGNOSIS — M6281 Muscle weakness (generalized): Secondary | ICD-10-CM

## 2018-07-27 DIAGNOSIS — R262 Difficulty in walking, not elsewhere classified: Secondary | ICD-10-CM

## 2018-07-27 DIAGNOSIS — R2681 Unsteadiness on feet: Secondary | ICD-10-CM | POA: Diagnosis not present

## 2018-07-27 DIAGNOSIS — R293 Abnormal posture: Secondary | ICD-10-CM

## 2018-07-27 DIAGNOSIS — R278 Other lack of coordination: Secondary | ICD-10-CM

## 2018-07-28 ENCOUNTER — Encounter: Payer: Self-pay | Admitting: Occupational Therapy

## 2018-07-28 NOTE — Therapy (Signed)
[place] MAIN [facility] SERVICES [address] [place], [place], [address] Phone: [phone]   Fax:  [phone]  Occupational Therapy Treatment  Patient Details  Name: Luke Durham MRN: [id number] Date of Birth: 1933/09/11 No data recorded  Encounter Date: 07/27/2018  OT End of Session - 07/28/18 1538    Visit Number  143    Number of Visits  156    Date for OT Re-Evaluation  10/12/18    Authorization Type  Medicare visit 3 of 10    OT Start Time  1100    OT Stop Time  1156    OT Time Calculation (min)  56 min    Activity Tolerance  Patient tolerated treatment well    Behavior During Therapy  [facility] for tasks assessed/performed       Past Medical History:  Diagnosis Date  . Anxiety   . Arthritis   . Asthma    childhood asthma  . Cancer ([place]) 7 years ago   lymphoma, [place] (recent)  . Chronic kidney disease   . Colon polyps   . GERD (gastroesophageal reflux disease)   . History of kidney stones   . Parkinson disease [facility])     Past Surgical History:  Procedure Laterality Date  . COLON SURGERY    . CYSTOSCOPY WITH LITHOLAPAXY N/A 11/14/2016   Procedure: CYSTOSCOPY WITH LITHOLAPAXY;  Surgeon: Hollice Espy, MD;  Location: [facility] ORS;  Service: Urology;  Laterality: N/A;  . EYE SURGERY Bilateral    Cataract Extraction with IOL  . HERNIA REPAIR  20 years  . MOHS SURGERY    . SMALL INTESTINE SURGERY     Per patient 7 years  . TRANSURETHRAL RESECTION OF PROSTATE N/A 11/14/2016   Procedure: TRANSURETHRAL RESECTION OF THE PROSTATE (TURP);  Surgeon: Hollice Espy, MD;  Location: [facility] ORS;  Service: Urology;  Laterality: N/A;    There were no vitals filed for this visit.  Subjective Assessment - 07/28/18 1537    Subjective   Patient reports he will be here one time next week but will take the following week off for vacation.     Pertinent History  Patient reports he was diagnosed with Parkinson's disease about 10 plus years ago.  He  reports a more recent decline in function in his daily activities in the last 6-12 months.  He has been seeing PT for the last couple months.    Patient Stated Goals  Patient reports he wants to be able to do more for himself and around the house.     Currently in Pain?  No/denies    Pain Score  0-No pain                   OT Treatments/Exercises (OP) - 07/28/18 1540      Neurological Re-education Exercises   Other Exercises 1  Patient seen for instruction of LSVT BIG exercises: LSVT Daily Session Maximal Daily Exercises: Sustained movements are designed to rescale the amplitude of movement output for generalization to daily functional activities. Performed as follows for 1 set of 10 repetitions each: Multi directional sustained movements- 1) Floor to ceiling, 2) Side to side. Multi directional Repetitive movements performed in standing and are designed to provide retraining effort needed for sustained muscle activation in tasks Performed as follows: 3) Step and reach forward, 4) Step and Reach Backwards, 5) Step and reach sideways, 6) Rock and reach forward/backward, 7) Rock and reach sideways.  CGA for exercises in standing  for exercises , verbal and tactile cues to complete with correct form and arm   placements.    Other Exercises 2  Patient seen for exercises in standing at the wall for postural stretches, cues verbally and tactile for correct form.  Ball exercises in standing for shoulder flexion with working towards maintaining balance, encouraging upright posture, eye gaze up towards ball with cues.  Patient seen for functional mobility skills with stop and go patterns with emphasis on inititation of movement and termination of movement patterns with cues.  More difficulty with initiation of movements versus termination.  Patient demonstrates some difficulty with turning especially when distracted or timed             OT Education - 07/28/18 1538    Education provided  Yes     Education Details  stepping patterns while in seated position, HEP    Person(s) Educated  Patient    Methods  Demonstration;Explanation;Verbal cues;Tactile cues    Comprehension  Verbal cues required;Returned demonstration;Verbalized understanding;Tactile cues required          OT Long Term Goals - 07/20/18 0923      OT LONG TERM GOAL #1   Title  Patient will demonstrate increase right hand grip to be able to cut food with modified independence.     Baseline  occasional assist required.    Time  12    Period  Weeks    Status  Partially Met      OT LONG TERM GOAL #2   Title  Patient will complete donning long sleeve shirt with modified independence.    Baseline  requires assist with long sleeves, can perform short sleeves.    Time  12    Period  Weeks    Status  Achieved      OT LONG TERM GOAL #3   Title  Patient will improve coordination to manage buttons on clothing with occasional assistance only.    Baseline  requires assist with buttons each time    Time  12    Period  Weeks    Status  Partially Met      OT LONG TERM GOAL #4   Title  Patient will demonstrate upright posture with shoulders back and head up during exercises with minimal cues.      Baseline  moderate cues for positioning during exercises.     Time  12    Period  Weeks    Status  On-going      OT LONG TERM GOAL #5   Title  Patient will demonstrate turning behaviors with rollator using large steps and keeping feet behind the rollator with all turns with occasional minimal cues.     Time  12    Period  Weeks    Status  On-going      OT LONG TERM GOAL #6   Title  Patient will improve gait speed and endurance and be able to walk 950 feet in 6 minutes to negotiate around the home and community safely in 12 weeks    Baseline  860 at reassessment, 409 at recert 7/35    Time  4    Period  Weeks    Status  On-going      OT LONG TERM GOAL #7   Title  Patient will complete HEP for maximal daily exercises  with modified independence in 4 weeks    Time  12    Period  Weeks    Status  Partially Met      OT LONG TERM GOAL #8   Title  Patient will transfer from sit to stand without the use of arms safely and independently from a variety of chairs/surfaces in 4 weeks.     Baseline  difficulty from lower surfaces    Time  4    Period  Weeks    Status  Achieved      OT LONG TERM GOAL  #9   Baseline  Patient will demonstrate decreased episodes of freezing of behaviors from score of 16 to score of less than 12.    Time  4    Period  Weeks    Status  Achieved            Plan - 07/28/18 1538    Clinical Impression Statement  Patient denies right shoulder pain this session and reports his shoulder has felt better after last session.  He requests to work on posture and stretches and reports he feels best when he is "lengthened out".  Patient continues to require cues for postural exercises.  He felt last sessions exercises in sitting helped with his hips, therefore repeated those this date with cues. Continue to work towards encouraging BIG movement patterns, calibration of turns and to decrease fall risk.      Occupational Profile and client history currently impacting functional performance  progressive disease process, freezing of gait behaviors, fall risk    Occupational performance deficits (Please refer to evaluation for details):  ADL's;IADL's;Leisure    Rehab Potential  Good    Current Impairments/barriers affecting progress:  positive: family support, negative: level of motivation, progressive disease    OT Frequency  2x / week    OT Duration  12 weeks    Consulted and Agree with Plan of Care  Patient       Patient will benefit from skilled therapeutic intervention in order to improve the following deficits and impairments:  Abnormal gait, Decreased coordination, Decreased range of motion, Difficulty walking, Decreased endurance, Decreased safety awareness, Decreased activity  tolerance, Decreased balance, Impaired UE functional use, Pain, Decreased mobility, Decreased strength  Visit Diagnosis: Unsteadiness on feet  Abnormal posture  Difficulty in walking, not elsewhere classified  Muscle weakness (generalized)  Other lack of coordination    Problem List Patient Active Problem List   Diagnosis Date Noted  . Dementia due to Parkinson's disease without behavioral disturbance ([place]) 07/18/2017  . Neurogenic orthostatic hypotension ([place]) 12/02/2016  . BPH with urinary obstruction 11/14/2016  . Urinary frequency 10/10/2016  . Microscopic hematuria 10/10/2016  . Parkinson's syndrome ([place]) 06/14/2016  . Arthritis 06/14/2016  . Depression 06/14/2016  . Heartburn 06/14/2016  . Urinary incontinence 06/14/2016  . Skin lesion 06/14/2016  . Dementia ([place]) 06/14/2016  . Bilateral edema of lower extremity 06/14/2016   Achilles Dunk, OTR/L, CLT  Lovett,Amy 07/28/2018, 3:46 PM  [place] MAIN [facility] SERVICES [address] [place], [place], [address] Phone: [phone]   Fax:  [phone]  Name: Luke Durham MRN: [id number] Date of Birth: 03-Jan-1933

## 2018-07-30 ENCOUNTER — Ambulatory Visit: Payer: Medicare Other | Admitting: Occupational Therapy

## 2018-07-30 DIAGNOSIS — R2681 Unsteadiness on feet: Secondary | ICD-10-CM

## 2018-07-30 DIAGNOSIS — R262 Difficulty in walking, not elsewhere classified: Secondary | ICD-10-CM

## 2018-07-30 DIAGNOSIS — M6281 Muscle weakness (generalized): Secondary | ICD-10-CM

## 2018-07-30 DIAGNOSIS — R278 Other lack of coordination: Secondary | ICD-10-CM

## 2018-07-30 DIAGNOSIS — R293 Abnormal posture: Secondary | ICD-10-CM

## 2018-07-31 ENCOUNTER — Encounter: Payer: Self-pay | Admitting: Occupational Therapy

## 2018-07-31 NOTE — Therapy (Signed)
[place] MAIN [facility] SERVICES [address] [place], [place], [address] Phone: [phone]   Fax:  [phone]  Occupational Therapy Treatment  Patient Details  Name: Luke Durham MRN: [id number] Date of Birth: 1933/09/13 No data recorded  Encounter Date: 07/30/2018  OT End of Session - 07/31/18 1033    Visit Number  144    Number of Visits  156    Date for OT Re-Evaluation  10/12/18    Authorization Type  Medicare visit 4 of 10    OT Start Time  1100    OT Stop Time  1200    OT Time Calculation (min)  60 min    Activity Tolerance  Patient tolerated treatment well    Behavior During Therapy  [facility] for tasks assessed/performed       Past Medical History:  Diagnosis Date  . Anxiety   . Arthritis   . Asthma    childhood asthma  . Cancer ([place]) 7 years ago   lymphoma, [place] (recent)  . Chronic kidney disease   . Colon polyps   . GERD (gastroesophageal reflux disease)   . History of kidney stones   . Parkinson disease [facility])     Past Surgical History:  Procedure Laterality Date  . COLON SURGERY    . CYSTOSCOPY WITH LITHOLAPAXY N/A 11/14/2016   Procedure: CYSTOSCOPY WITH LITHOLAPAXY;  Surgeon: Hollice Espy, MD;  Location: [facility] ORS;  Service: Urology;  Laterality: N/A;  . EYE SURGERY Bilateral    Cataract Extraction with IOL  . HERNIA REPAIR  20 years  . MOHS SURGERY    . SMALL INTESTINE SURGERY     Per patient 7 years  . TRANSURETHRAL RESECTION OF PROSTATE N/A 11/14/2016   Procedure: TRANSURETHRAL RESECTION OF THE PROSTATE (TURP);  Surgeon: Hollice Espy, MD;  Location: [facility] ORS;  Service: Urology;  Laterality: N/A;    There were no vitals filed for this visit.  Subjective Assessment - 07/31/18 1031    Subjective   Patient reports he is doing well, had a good weekend and went with his daughter to the farmers market/pumpkin [place].  No reports of falls.    Pertinent History  Patient reports he was diagnosed with Parkinson's  disease about 10 plus years ago.  He reports a more recent decline in function in his daily activities in the last 6-12 months.  He has been seeing PT for the last couple months.    Patient Stated Goals  Patient reports he wants to be able to do more for himself and around the house.     Currently in Pain?  No/denies    Pain Score  0-No pain                   OT Treatments/Exercises (OP) - 07/31/18 1039      ADLs   ADL Comments  Posture during feeding tasks, posture with dressing UB as well as performing shoe donning/doffing and tying. Cues provided and modeling as needed.       Neurological Re-education Exercises   Other Exercises 1  Patient seen for instruction of LSVT BIG exercises: LSVT Daily Session Maximal Daily Exercises: Sustained movements are designed to rescale the amplitude of movement output for generalization to daily functional activities. Performed as follows for 1 set of 10 repetitions each: Multi directional sustained movements- 1) Floor to ceiling, 2) Side to side. Multi directional Repetitive movements performed in standing and are designed to provide retraining effort needed for  sustained muscle activation in tasks Performed as follows: 3) Step and reach forward, 4) Step and Reach Backwards, 5) Step and reach sideways, 6) Rock and reach forward/backward, 7) Rock and reach sideways.  CGA for exercises in standing for exercises , verbal and tactile cues to complete with correct form and arm   placements.    Other Exercises 2  Patient seen for posture exercises at the wall with cues, functional mobility with rollator for 1 trial of 600 feet, cues for posture during gait.  Seated exercises for forwards , side and backwards stepping patterns with therapist demo and cues.              OT Education - 07/31/18 1032    Education provided  Yes    Education Details  issued written exercises for seated exercises    Person(s) Educated  Patient;Spouse    Methods   Demonstration;Explanation;Verbal cues;Tactile cues    Comprehension  Verbal cues required;Returned demonstration;Verbalized understanding;Tactile cues required          OT Long Term Goals - 07/20/18 0923      OT LONG TERM GOAL #1   Title  Patient will demonstrate increase right hand grip to be able to cut food with modified independence.     Baseline  occasional assist required.    Time  12    Period  Weeks    Status  Partially Met      OT LONG TERM GOAL #2   Title  Patient will complete donning long sleeve shirt with modified independence.    Baseline  requires assist with long sleeves, can perform short sleeves.    Time  12    Period  Weeks    Status  Achieved      OT LONG TERM GOAL #3   Title  Patient will improve coordination to manage buttons on clothing with occasional assistance only.    Baseline  requires assist with buttons each time    Time  12    Period  Weeks    Status  Partially Met      OT LONG TERM GOAL #4   Title  Patient will demonstrate upright posture with shoulders back and head up during exercises with minimal cues.      Baseline  moderate cues for positioning during exercises.     Time  12    Period  Weeks    Status  On-going      OT LONG TERM GOAL #5   Title  Patient will demonstrate turning behaviors with rollator using large steps and keeping feet behind the rollator with all turns with occasional minimal cues.     Time  12    Period  Weeks    Status  On-going      OT LONG TERM GOAL #6   Title  Patient will improve gait speed and endurance and be able to walk 950 feet in 6 minutes to negotiate around the home and community safely in 12 weeks    Baseline  860 at reassessment, 622 at recert 2/97    Time  4    Period  Weeks    Status  On-going      OT LONG TERM GOAL #7   Title  Patient will complete HEP for maximal daily exercises with modified independence in 4 weeks    Time  12    Period  Weeks    Status  Partially Met      OT LONG  TERM  GOAL #8   Title  Patient will transfer from sit to stand without the use of arms safely and independently from a variety of chairs/surfaces in 4 weeks.     Baseline  difficulty from lower surfaces    Time  4    Period  Weeks    Status  Achieved      OT LONG TERM GOAL  #9   Baseline  Patient will demonstrate decreased episodes of freezing of behaviors from score of 16 to score of less than 12.    Time  4    Period  Weeks    Status  Achieved            Plan - 07/31/18 1034    Clinical Impression Statement  Patient feels the seated exercises we have been doing the last couple of times have value but he cannot remember them at home.  Patient issued written/pictorial exercises with seated exercises for stepping forwards, to each side and backwards, cues and modeling from therapist.  Instructed wife on exercises for home program and she is able to demonstrate understanding.  Patient continues to work towards posture exercises, requires minimal cues for initiation of exercise but moderate cues to continue to carryover into other activities.  Continue to work towards balance, strengthening and calibration of movement patterns using BIG principles.     Occupational Profile and client history currently impacting functional performance  progressive disease process, freezing of gait behaviors, fall risk    Occupational performance deficits (Please refer to evaluation for details):  ADL's;IADL's;Leisure    Rehab Potential  Good    Current Impairments/barriers affecting progress:  positive: family support, negative: level of motivation, progressive disease    OT Frequency  2x / week    OT Duration  12 weeks    OT Treatment/Interventions  Self-care/ADL training;Moist Heat;DME and/or AE instruction;Balance training;Therapeutic activities;Therapeutic exercise;Neuromuscular education;Functional Mobility Training;Patient/family education    Consulted and Agree with Plan of Care  Patient       Patient  will benefit from skilled therapeutic intervention in order to improve the following deficits and impairments:  Abnormal gait, Decreased coordination, Decreased range of motion, Difficulty walking, Decreased endurance, Decreased safety awareness, Decreased activity tolerance, Decreased balance, Impaired UE functional use, Pain, Decreased mobility, Decreased strength  Visit Diagnosis: Unsteadiness on feet  Muscle weakness (generalized)  Other lack of coordination  Abnormal posture  Difficulty in walking, not elsewhere classified    Problem List Patient Active Problem List   Diagnosis Date Noted  . Dementia due to Parkinson's disease without behavioral disturbance ([place]) 07/18/2017  . Neurogenic orthostatic hypotension ([place]) 12/02/2016  . BPH with urinary obstruction 11/14/2016  . Urinary frequency 10/10/2016  . Microscopic hematuria 10/10/2016  . Parkinson's syndrome ([place]) 06/14/2016  . Arthritis 06/14/2016  . Depression 06/14/2016  . Heartburn 06/14/2016  . Urinary incontinence 06/14/2016  . Skin lesion 06/14/2016  . Dementia ([place]) 06/14/2016  . Bilateral edema of lower extremity 06/14/2016   Achilles Dunk, OTR/L, CLT  Lovett,Amy 07/31/2018, 10:58 AM  [place] MAIN [facility] SERVICES [address] [place], [place], [address] Phone: [phone]   Fax:  [phone]  Name: Kellen Dutch MRN: [id number] Date of Birth: 1932/12/21

## 2018-08-13 ENCOUNTER — Ambulatory Visit: Payer: Medicare Other | Admitting: Occupational Therapy

## 2018-08-13 DIAGNOSIS — R278 Other lack of coordination: Secondary | ICD-10-CM

## 2018-08-13 DIAGNOSIS — R262 Difficulty in walking, not elsewhere classified: Secondary | ICD-10-CM

## 2018-08-13 DIAGNOSIS — R2681 Unsteadiness on feet: Secondary | ICD-10-CM

## 2018-08-13 DIAGNOSIS — R293 Abnormal posture: Secondary | ICD-10-CM

## 2018-08-13 DIAGNOSIS — M6281 Muscle weakness (generalized): Secondary | ICD-10-CM

## 2018-08-17 ENCOUNTER — Ambulatory Visit: Payer: Medicare Other | Admitting: Occupational Therapy

## 2018-08-17 DIAGNOSIS — R2681 Unsteadiness on feet: Secondary | ICD-10-CM

## 2018-08-17 DIAGNOSIS — R293 Abnormal posture: Secondary | ICD-10-CM

## 2018-08-17 DIAGNOSIS — R278 Other lack of coordination: Secondary | ICD-10-CM

## 2018-08-17 DIAGNOSIS — M6281 Muscle weakness (generalized): Secondary | ICD-10-CM

## 2018-08-17 DIAGNOSIS — R262 Difficulty in walking, not elsewhere classified: Secondary | ICD-10-CM

## 2018-08-18 ENCOUNTER — Encounter: Payer: Self-pay | Admitting: Occupational Therapy

## 2018-08-18 NOTE — Therapy (Signed)
[place] MAIN [facility] SERVICES [address] [place], [place], [address] Phone: [phone]   Fax:  [phone]  Occupational Therapy Treatment  Patient Details  Name: Luke Durham MRN: [id number] Date of Birth: 1933-03-19 No data recorded  Encounter Date: 08/13/2018  OT End of Session - 08/18/18 1413    Visit Number  145    Number of Visits  156    Date for OT Re-Evaluation  10/12/18    Authorization Type  Medicare visit 5 of 10    OT Start Time  1101    OT Stop Time  1159    OT Time Calculation (min)  58 min    Activity Tolerance  Patient tolerated treatment well    Behavior During Therapy  [facility] for tasks assessed/performed       Past Medical History:  Diagnosis Date  . Anxiety   . Arthritis   . Asthma    childhood asthma  . Cancer ([place]) 7 years ago   lymphoma, [place] (recent)  . Chronic kidney disease   . Colon polyps   . GERD (gastroesophageal reflux disease)   . History of kidney stones   . Parkinson disease [facility])     Past Surgical History:  Procedure Laterality Date  . COLON SURGERY    . CYSTOSCOPY WITH LITHOLAPAXY N/A 11/14/2016   Procedure: CYSTOSCOPY WITH LITHOLAPAXY;  Surgeon: Hollice Espy, MD;  Location: [facility] ORS;  Service: Urology;  Laterality: N/A;  . EYE SURGERY Bilateral    Cataract Extraction with IOL  . HERNIA REPAIR  20 years  . MOHS SURGERY    . SMALL INTESTINE SURGERY     Per patient 7 years  . TRANSURETHRAL RESECTION OF PROSTATE N/A 11/14/2016   Procedure: TRANSURETHRAL RESECTION OF THE PROSTATE (TURP);  Surgeon: Hollice Espy, MD;  Location: [facility] ORS;  Service: Urology;  Laterality: N/A;    There were no vitals filed for this visit.  Subjective Assessment - 08/18/18 1411    Subjective   Patient reports he took a vacation the last week and didn't do much in the way of exercises.  He still feels he is having trouble with the exercises therapist gave prior to last week, seated stepping patterns.      Pertinent History  Patient reports he was diagnosed with Parkinson's disease about 10 plus years ago.  He reports a more recent decline in function in his daily activities in the last 6-12 months.  He has been seeing PT for the last couple months.    Patient Stated Goals  Patient reports he wants to be able to do more for himself and around the house.     Currently in Pain?  No/denies    Pain Score  0-No pain                   OT Treatments/Exercises (OP) - 08/18/18 1415      Neurological Re-education Exercises   Other Exercises 1  Patient seen for instruction of LSVT BIG exercises: LSVT Daily Session Maximal Daily Exercises: Sustained movements are designed to rescale the amplitude of movement output for generalization to daily functional activities. Performed as follows for 1 set of 10 repetitions each: Multi directional sustained movements- 1) Floor to ceiling, 2) Side to side. Multi directional Repetitive movements performed in standing and are designed to provide retraining effort needed for sustained muscle activation in tasks Performed as follows: 3) Step and reach forward, 4) Step and Reach Backwards, 5) Step  and reach sideways, 6) Rock and reach forward/backward, 7) Rock and reach sideways.  CGA for exercises in standing for exercises , verbal and tactile cues to complete with correct form and arm   placements.  Reinstructed on seated exercises for stepping patterns right, left, forwards and back with cues and therapist demonstration, 10 repetitions for 2 trials.  Posture exercises in sitting for pec stretch by therapist, pelvic tilts with facilitation and cues for upright posture.  Exercises on the wall for posture for feedback.  Continual cues during exercises to keep back straight, head up and eyes forward.      Other Exercises 2  Blood pressure taken multiple times today and was 83/63 on average, monitored throughout session and limited exercises in standing.  Patient denies any  lightheadedness or dizziness.  He reports his blood pressure has varied at home and sometimes high.               OT Education - 08/18/18 1413    Education provided  Yes    Education Details  HEP, seated exercises    Person(s) Educated  Patient;Spouse    Methods  Demonstration;Explanation;Verbal cues;Tactile cues    Comprehension  Verbal cues required;Returned demonstration;Verbalized understanding;Tactile cues required          OT Long Term Goals - 07/20/18 0923      OT LONG TERM GOAL #1   Title  Patient will demonstrate increase right hand grip to be able to cut food with modified independence.     Baseline  occasional assist required.    Time  12    Period  Weeks    Status  Partially Met      OT LONG TERM GOAL #2   Title  Patient will complete donning long sleeve shirt with modified independence.    Baseline  requires assist with long sleeves, can perform short sleeves.    Time  12    Period  Weeks    Status  Achieved      OT LONG TERM GOAL #3   Title  Patient will improve coordination to manage buttons on clothing with occasional assistance only.    Baseline  requires assist with buttons each time    Time  12    Period  Weeks    Status  Partially Met      OT LONG TERM GOAL #4   Title  Patient will demonstrate upright posture with shoulders back and head up during exercises with minimal cues.      Baseline  moderate cues for positioning during exercises.     Time  12    Period  Weeks    Status  On-going      OT LONG TERM GOAL #5   Title  Patient will demonstrate turning behaviors with rollator using large steps and keeping feet behind the rollator with all turns with occasional minimal cues.     Time  12    Period  Weeks    Status  On-going      OT LONG TERM GOAL #6   Title  Patient will improve gait speed and endurance and be able to walk 950 feet in 6 minutes to negotiate around the home and community safely in 12 weeks    Baseline  860 at  reassessment, 017 at recert 7/93    Time  4    Period  Weeks    Status  On-going      OT LONG TERM GOAL #7  Title  Patient will complete HEP for maximal daily exercises with modified independence in 4 weeks    Time  12    Period  Weeks    Status  Partially Met      OT LONG TERM GOAL #8   Title  Patient will transfer from sit to stand without the use of arms safely and independently from a variety of chairs/surfaces in 4 weeks.     Baseline  difficulty from lower surfaces    Time  4    Period  Weeks    Status  Achieved      OT LONG TERM GOAL  #9   Baseline  Patient will demonstrate decreased episodes of freezing of behaviors from score of 16 to score of less than 12.    Time  4    Period  Weeks    Status  Achieved            Plan - 08/18/18 1414    Clinical Impression Statement  Patient with low BP today 83/63 but asymptomatic.  Reports his pressure has been varying at home from the lower end to high and wife confirms.  Advised patient to follow up with MD and to also keep a log of his pressures and times of day to see if there is any trend.  Patient continues to demonstrate difficulty with newer exercises from a seated position but would like to continue to work towards these in coming sessions.  He denied any falls during the last week    Occupational Profile and client history currently impacting functional performance  progressive disease process, freezing of gait behaviors, fall risk    Occupational performance deficits (Please refer to evaluation for details):  ADL's;IADL's;Leisure    Rehab Potential  Good    Current Impairments/barriers affecting progress:  positive: family support, negative: level of motivation, progressive disease    OT Frequency  2x / week    OT Duration  12 weeks    OT Treatment/Interventions  Self-care/ADL training;Moist Heat;DME and/or AE instruction;Balance training;Therapeutic activities;Therapeutic exercise;Neuromuscular education;Functional  Mobility Training;Patient/family education    Consulted and Agree with Plan of Care  Patient       Patient will benefit from skilled therapeutic intervention in order to improve the following deficits and impairments:  Abnormal gait, Decreased coordination, Decreased range of motion, Difficulty walking, Decreased endurance, Decreased safety awareness, Decreased activity tolerance, Decreased balance, Impaired UE functional use, Pain, Decreased mobility, Decreased strength  Visit Diagnosis: Muscle weakness (generalized)  Other lack of coordination  Unsteadiness on feet  Abnormal posture  Difficulty in walking, not elsewhere classified    Problem List Patient Active Problem List   Diagnosis Date Noted  . Dementia due to Parkinson's disease without behavioral disturbance ([place]) 07/18/2017  . Neurogenic orthostatic hypotension ([place]) 12/02/2016  . BPH with urinary obstruction 11/14/2016  . Urinary frequency 10/10/2016  . Microscopic hematuria 10/10/2016  . Parkinson's syndrome ([place]) 06/14/2016  . Arthritis 06/14/2016  . Depression 06/14/2016  . Heartburn 06/14/2016  . Urinary incontinence 06/14/2016  . Skin lesion 06/14/2016  . Dementia ([place]) 06/14/2016  . Bilateral edema of lower extremity 06/14/2016   Achilles Dunk, OTR/L, CLT  Lovett,Amy 08/18/2018, 2:22 PM  [place] MAIN [facility] SERVICES [address] [place], [place], [address] Phone: [phone]   Fax:  [phone]  Name: Maximillion Gill MRN: [id number] Date of Birth: Aug 08, 1933

## 2018-08-18 NOTE — Therapy (Addendum)
[place] MAIN [facility] SERVICES [address] [place], [place], [address] Phone: [phone]   Fax:  [phone]  Occupational Therapy Treatment  Patient Details  Name: Luke Durham MRN: [id number] Date of Birth: [birth year] No data recorded  Encounter Date: 08/17/2018  OT End of Session - 08/29/18 1841    Visit Number  [id number]    Number of Visits  156    Date for OT Re-Evaluation  10/12/18    Authorization Type  Medicare visit 6 of 10    OT Start Time  1100    OT Stop Time  1155    OT Time Calculation (min)  55 min    Activity Tolerance  Patient tolerated treatment well    Behavior During Therapy  [facility] for tasks assessed/performed       Past Medical History:  Diagnosis Date  . Anxiety   . Arthritis   . Asthma    childhood asthma  . Cancer ([place]) 7 years ago   lymphoma, [place] (recent)  . Chronic kidney disease   . Colon polyps   . GERD (gastroesophageal reflux disease)   . History of kidney stones   . Parkinson disease [facility])     Past Surgical History:  Procedure Laterality Date  . COLON SURGERY    . CYSTOSCOPY WITH LITHOLAPAXY N/A 11/14/2016   Procedure: CYSTOSCOPY WITH LITHOLAPAXY;  Surgeon: Hollice Espy, MD;  Location: [facility] ORS;  Service: Urology;  Laterality: N/A;  . EYE SURGERY Bilateral    Cataract Extraction with IOL  . HERNIA REPAIR  20 years  . MOHS SURGERY    . SMALL INTESTINE SURGERY     Per patient 7 years  . TRANSURETHRAL RESECTION OF PROSTATE N/A 11/14/2016   Procedure: TRANSURETHRAL RESECTION OF THE PROSTATE (TURP);  Surgeon: Hollice Espy, MD;  Location: [facility] ORS;  Service: Urology;  Laterality: N/A;    There were no vitals filed for this visit.  Subjective Assessment - 08/29/18 1840    Subjective   Patient reports he has been trying to work more on the exercises but is still struggling    Pertinent History  Patient reports he was diagnosed with Parkinson's disease about 10 plus years ago.  He reports a more  recent decline in function in his daily activities in the last 6-12 months.  He has been seeing PT for the last couple months.    Patient Stated Goals  Patient reports he wants to be able to do more for himself and around the house.     Currently in Pain?  No/denies    Pain Score  0-No pain          Patient seen for instruction of LSVT BIG exercises: LSVT Daily Session Maximal Daily Exercises: Sustained movements are designed to rescale the amplitude of movement output for generalization to daily functional activities. Performed as follows for 1 set of 10 repetitions each: Multi directional sustained movements- 1) Floor to ceiling, 2) Side to side. Multi directional Repetitive movements performed in standing and are designed to provide retraining effort needed for sustained muscle activation in tasks Performed as follows: 3) Step and reach forward, 4) Step and Reach Backwards, 5) Step and reach sideways, 6) Rock and reach forward/backward, 7) Rock and reach sideways.  CGA for exercises in standing for exercises , verbal and tactile cues to complete with correct form and arm   placements.  Reinstructed on seated exercises for stepping patterns right, left, forwards and back with cues and  therapist demonstration, 10 repetitions for 2 trials.    Patient seen for initiation and termination of gait with use of rollator, min guard from therapist and cues.  Multiple trials completed  Focused on turning in smaller spaces, patient still demonstrates difficulty with positioning of self in relation to rollator when turning sharply.  Cues to take BIG step to align self behind rollator.                    OT Education - 08/29/18 1841    Education provided  Yes    Education Details  HEP, seated exercises    Person(s) Educated  Patient;Spouse    Methods  Demonstration;Explanation;Verbal cues;Tactile cues    Comprehension  Verbal cues required;Returned demonstration;Verbalized  understanding;Tactile cues required          OT Long Term Goals - 07/20/18 0923      OT LONG TERM GOAL #1   Title  Patient will demonstrate increase right hand grip to be able to cut food with modified independence.     Baseline  occasional assist required.    Time  12    Period  Weeks    Status  Partially Met      OT LONG TERM GOAL #2   Title  Patient will complete donning long sleeve shirt with modified independence.    Baseline  requires assist with long sleeves, can perform short sleeves.    Time  12    Period  Weeks    Status  Achieved      OT LONG TERM GOAL #3   Title  Patient will improve coordination to manage buttons on clothing with occasional assistance only.    Baseline  requires assist with buttons each time    Time  12    Period  Weeks    Status  Partially Met      OT LONG TERM GOAL #4   Title  Patient will demonstrate upright posture with shoulders back and head up during exercises with minimal cues.      Baseline  moderate cues for positioning during exercises.     Time  12    Period  Weeks    Status  On-going      OT LONG TERM GOAL #5   Title  Patient will demonstrate turning behaviors with rollator using large steps and keeping feet behind the rollator with all turns with occasional minimal cues.     Time  12    Period  Weeks    Status  On-going      OT LONG TERM GOAL #6   Title  Patient will improve gait speed and endurance and be able to walk 950 feet in 6 minutes to negotiate around the home and community safely in 12 weeks    Baseline  860 at reassessment, 381 at recert 0/17    Time  4    Period  Weeks    Status  On-going      OT LONG TERM GOAL #7   Title  Patient will complete HEP for maximal daily exercises with modified independence in 4 weeks    Time  12    Period  Weeks    Status  Partially Met      OT LONG TERM GOAL #8   Title  Patient will transfer from sit to stand without the use of arms safely and independently from a variety of  chairs/surfaces in 4 weeks.     Baseline  difficulty from lower surfaces    Time  4    Period  Weeks    Status  Achieved      OT LONG TERM GOAL  #9   Baseline  Patient will demonstrate decreased episodes of freezing of behaviors from score of 16 to score of less than 12.    Time  4    Period  Weeks    Status  Achieved            Plan - 08/29/18 1842    Clinical Impression Statement  BP 99/58 this date.  Patient reports concern with increased freezing of gait and plans to discuss with MD at his next visit which is in a few days. He reports increased muscle fatigue and weakness with rigidity when freezing occurs.  He has experienced a couple of falls in the last month.  He demonstrates bradykinesia worse today than in last sessions and wife reports this has been a difficult and slow morning.  Encouraged pt to discuss with MD as well .  Continue to work towards improving functional mobility, reduce risk of falls and increase independence in daily tasks.  He needs to likely walk more during the days between therapy sessions.     Occupational Profile and client history currently impacting functional performance  progressive disease process, freezing of gait behaviors, fall risk       Patient will benefit from skilled therapeutic intervention in order to improve the following deficits and impairments:     Visit Diagnosis: Muscle weakness (generalized)  Other lack of coordination  Unsteadiness on feet  Abnormal posture  Difficulty in walking, not elsewhere classified    Problem List Patient Active Problem List   Diagnosis Date Noted  . Dementia due to Parkinson's disease without behavioral disturbance ([place]) 07/18/2017  . Neurogenic orthostatic hypotension ([place]) 12/02/2016  . BPH with urinary obstruction 11/14/2016  . Urinary frequency 10/10/2016  . Microscopic hematuria 10/10/2016  . Parkinson's syndrome ([place]) 06/14/2016  . Arthritis 06/14/2016  . Depression 06/14/2016  .  Heartburn 06/14/2016  . Urinary incontinence 06/14/2016  . Skin lesion 06/14/2016  . Dementia ([place]) 06/14/2016  . Bilateral edema of lower extremity 06/14/2016   Achilles Dunk, OTR/L, CLT  Lovett,Amy 08/29/2018, 6:50 PM  [place] MAIN [facility] SERVICES [address] [place], [place], [address] Phone: [phone]   Fax:  [phone]  Name: Luke Durham MRN: [id number] Date of Birth: 12/04/1932

## 2018-08-21 ENCOUNTER — Ambulatory Visit: Payer: Medicare Other | Admitting: Occupational Therapy

## 2018-08-21 DIAGNOSIS — R293 Abnormal posture: Secondary | ICD-10-CM

## 2018-08-21 DIAGNOSIS — R262 Difficulty in walking, not elsewhere classified: Secondary | ICD-10-CM

## 2018-08-21 DIAGNOSIS — M6281 Muscle weakness (generalized): Secondary | ICD-10-CM

## 2018-08-21 DIAGNOSIS — R2681 Unsteadiness on feet: Secondary | ICD-10-CM

## 2018-08-21 DIAGNOSIS — R278 Other lack of coordination: Secondary | ICD-10-CM

## 2018-08-23 NOTE — Therapy (Addendum)
[place] MAIN [facility] SERVICES [address] [place], [place], [address] Phone: [phone]   Fax:  [phone]  Occupational Therapy Treatment  Patient Details  Name: Luke Durham MRN: [id number] Date of Birth: 05-21-1933 No data recorded  Encounter Date: 08/21/2018  OT End of Session - 08/29/18 1933    OT Start Time  1101    OT Stop Time  1158    OT Time Calculation (min)  57 min    Activity Tolerance  Patient tolerated treatment well    Behavior During Therapy  [facility] for tasks assessed/performed       Past Medical History:  Diagnosis Date  . Anxiety   . Arthritis   . Asthma    childhood asthma  . Cancer ([place]) 7 years ago   lymphoma, [place] (recent)  . Chronic kidney disease   . Colon polyps   . GERD (gastroesophageal reflux disease)   . History of kidney stones   . Parkinson disease [facility])     Past Surgical History:  Procedure Laterality Date  . COLON SURGERY    . CYSTOSCOPY WITH LITHOLAPAXY N/A 11/14/2016   Procedure: CYSTOSCOPY WITH LITHOLAPAXY;  Surgeon: Hollice Espy, MD;  Location: [facility] ORS;  Service: Urology;  Laterality: N/A;  . EYE SURGERY Bilateral    Cataract Extraction with IOL  . HERNIA REPAIR  20 years  . MOHS SURGERY    . SMALL INTESTINE SURGERY     Per patient 7 years  . TRANSURETHRAL RESECTION OF PROSTATE N/A 11/14/2016   Procedure: TRANSURETHRAL RESECTION OF THE PROSTATE (TURP);  Surgeon: Hollice Espy, MD;  Location: [facility] ORS;  Service: Urology;  Laterality: N/A;    There were no vitals filed for this visit.  Subjective Assessment - 08/29/18 1930    Subjective   Pt reports he went to his neurologist yesterday and some of his medications have been adjusted.  He states, "I should be able to see a difference in about 10 days and I have to report back".    Pertinent History  Patient reports he was diagnosed with Parkinson's disease about 10 plus years ago.  He reports a more recent decline in function in his  daily activities in the last 6-12 months.  He has been seeing PT for the last couple months.    Patient Stated Goals  Patient reports he wants to be able to do more for himself and around the house.     Currently in Pain?  No/denies    Pain Score  0-No pain         Patient seen for instruction of LSVT BIG exercises: LSVT Daily Session Maximal Daily Exercises: Sustained movements are designed to rescale the amplitude of movement output for generalization to daily functional activities. Performed as follows for 1 set of 10 repetitions each: Multi directional sustained movements- 1) Floor to ceiling, 2) Side to side. Multi directional Repetitive movements performed in standing and are designed to provide retraining effort needed for sustained muscle activation in tasks Performed as follows: 3) Step and reach forward, 4) Step and Reach Backwards, 5) Step and reach sideways, 6) Rock and reach forward/backward, 7) Rock and reach sideways. CGA for exercises in standing for exercises , verbal and tactile cues to complete with correct form and arm placements. Reinstructed on seated exercises for stepping patterns right, left, forwards and back with cues and therapist demonstration, 10 repetitions for 2 trials.         Patient seen for focus  this date on navigating in crowded spaces such as elevator, cafeteria.  Cues to continue to ambulate through crowd and not to stop to wait for people to move around him.  When he stops in a crowd this often triggers a freezing of gait response and has difficulty with initiation of movement.  Hesitations noted with crossing threshold of the elevator and especially if elevator is close to being full.               OT Education - 08/29/18 1931    Education provided  Yes    Education Details  seated exercises, posture    Person(s) Educated  Patient;Spouse    Methods  Demonstration;Explanation;Verbal cues;Tactile cues    Comprehension  Verbal cues  required;Returned demonstration;Verbalized understanding;Tactile cues required          OT Long Term Goals - 07/20/18 0923      OT LONG TERM GOAL #1   Title  Patient will demonstrate increase right hand grip to be able to cut food with modified independence.     Baseline  occasional assist required.    Time  12    Period  Weeks    Status  Partially Met      OT LONG TERM GOAL #2   Title  Patient will complete donning long sleeve shirt with modified independence.    Baseline  requires assist with long sleeves, can perform short sleeves.    Time  12    Period  Weeks    Status  Achieved      OT LONG TERM GOAL #3   Title  Patient will improve coordination to manage buttons on clothing with occasional assistance only.    Baseline  requires assist with buttons each time    Time  12    Period  Weeks    Status  Partially Met      OT LONG TERM GOAL #4   Title  Patient will demonstrate upright posture with shoulders back and head up during exercises with minimal cues.      Baseline  moderate cues for positioning during exercises.     Time  12    Period  Weeks    Status  On-going      OT LONG TERM GOAL #5   Title  Patient will demonstrate turning behaviors with rollator using large steps and keeping feet behind the rollator with all turns with occasional minimal cues.     Time  12    Period  Weeks    Status  On-going      OT LONG TERM GOAL #6   Title  Patient will improve gait speed and endurance and be able to walk 950 feet in 6 minutes to negotiate around the home and community safely in 12 weeks    Baseline  860 at reassessment, 818 at recert 2/99    Time  4    Period  Weeks    Status  On-going      OT LONG TERM GOAL #7   Title  Patient will complete HEP for maximal daily exercises with modified independence in 4 weeks    Time  12    Period  Weeks    Status  Partially Met      OT LONG TERM GOAL #8   Title  Patient will transfer from sit to stand without the use of arms  safely and independently from a variety of chairs/surfaces in 4 weeks.  Baseline  difficulty from lower surfaces    Time  4    Period  Weeks    Status  Achieved      OT LONG TERM GOAL  #9   Baseline  Patient will demonstrate decreased episodes of freezing of behaviors from score of 16 to score of less than 12.    Time  4    Period  Weeks    Status  Achieved            Plan - 08/29/18 1933    Clinical Impression Statement  106/83 BP this date.  Reports his BP was normal at his MD appt yesterday.  Patient on adjusted medication to see if this will help decrease freezing of gait behaviors and diminish episodes of muscle rigidity, he is monitoring how he feels for 10 days and will report back to MD.  Patient seen for focus this date on navigating in crowded spaces such as elevator, cafeteria.  Cues to continue to ambulate through crowd and not to stop to wait for people to move around him.  When he stops in a crowd this often triggers a freezing of gait response and has difficulty with initiation of movement.  Hesitations noted with crossing threshold of the elevator and especially if elevator is close to being full. He continues to work towards exercises at home with the addition of some seated exercises in the last month.     Occupational Profile and client history currently impacting functional performance  progressive disease process, freezing of gait behaviors, fall risk    Occupational performance deficits (Please refer to evaluation for details):  ADL's;IADL's;Leisure    Rehab Potential  Good    Current Impairments/barriers affecting progress:  positive: family support, negative: level of motivation, progressive disease    OT Frequency  2x / week    OT Duration  12 weeks    OT Treatment/Interventions  Self-care/ADL training;Moist Heat;DME and/or AE instruction;Balance training;Therapeutic activities;Therapeutic exercise;Neuromuscular education;Functional Mobility  Training;Patient/family education    Consulted and Agree with Plan of Care  Patient       Patient will benefit from skilled therapeutic intervention in order to improve the following deficits and impairments:  Abnormal gait, Decreased coordination, Decreased range of motion, Difficulty walking, Decreased endurance, Decreased safety awareness, Decreased activity tolerance, Decreased balance, Impaired UE functional use, Pain, Decreased mobility, Decreased strength  Visit Diagnosis: Muscle weakness (generalized)  Other lack of coordination  Unsteadiness on feet  Abnormal posture  Difficulty in walking, not elsewhere classified    Problem List Patient Active Problem List   Diagnosis Date Noted  . Dementia due to Parkinson's disease without behavioral disturbance ([place]) 07/18/2017  . Neurogenic orthostatic hypotension ([place]) 12/02/2016  . BPH with urinary obstruction 11/14/2016  . Urinary frequency 10/10/2016  . Microscopic hematuria 10/10/2016  . Parkinson's syndrome ([place]) 06/14/2016  . Arthritis 06/14/2016  . Depression 06/14/2016  . Heartburn 06/14/2016  . Urinary incontinence 06/14/2016  . Skin lesion 06/14/2016  . Dementia ([place]) 06/14/2016  . Bilateral edema of lower extremity 06/14/2016   Achilles Dunk, OTR/L, CLT  Lovett,Amy 08/29/2018, 7:40 PM  [place] MAIN [facility] SERVICES [address] [place], [place], [address] Phone: [phone]   Fax:  [phone]  Name: Luke Durham MRN: [id number] Date of Birth: September 06, 1933

## 2018-08-24 ENCOUNTER — Ambulatory Visit: Payer: Medicare Other | Attending: Physician Assistant | Admitting: Occupational Therapy

## 2018-08-24 DIAGNOSIS — R293 Abnormal posture: Secondary | ICD-10-CM | POA: Diagnosis present

## 2018-08-24 DIAGNOSIS — R278 Other lack of coordination: Secondary | ICD-10-CM | POA: Diagnosis present

## 2018-08-24 DIAGNOSIS — R262 Difficulty in walking, not elsewhere classified: Secondary | ICD-10-CM | POA: Diagnosis present

## 2018-08-24 DIAGNOSIS — R2681 Unsteadiness on feet: Secondary | ICD-10-CM | POA: Insufficient documentation

## 2018-08-24 DIAGNOSIS — M6281 Muscle weakness (generalized): Secondary | ICD-10-CM

## 2018-08-28 ENCOUNTER — Ambulatory Visit: Payer: Medicare Other | Admitting: Occupational Therapy

## 2018-08-28 DIAGNOSIS — R278 Other lack of coordination: Secondary | ICD-10-CM

## 2018-08-28 DIAGNOSIS — M6281 Muscle weakness (generalized): Secondary | ICD-10-CM

## 2018-08-28 DIAGNOSIS — R262 Difficulty in walking, not elsewhere classified: Secondary | ICD-10-CM

## 2018-08-28 DIAGNOSIS — R293 Abnormal posture: Secondary | ICD-10-CM

## 2018-08-28 DIAGNOSIS — R2681 Unsteadiness on feet: Secondary | ICD-10-CM

## 2018-08-28 IMAGING — CT CT ABD-PEL WO/W CM
2 of 10 series · 10 of 46 positions shown, 16 images · IV contrast (APPLIED)
Comparison: No priors.

CLINICAL DATA: 83-year-old male with history of hematuria for 1
month.

EXAM:
CT ABDOMEN AND PELVIS WITHOUT AND WITH CONTRAST
TECHNIQUE: Multidetector CT imaging of the abdomen and pelvis was performed
following the standard protocol before and following the bolus
administration of intravenous contrast.
CONTRAST:  125mL 3I2JVL-HFF IOPAMIDOL (3I2JVL-HFF) INJECTION 61%

[Series 2: axial pre · axial · non-contrast · 0.77mm/px · z∈[-454,-94]mm · 8 of 94 slices shown, 13 images]
[im 11/94  soft-tissue]
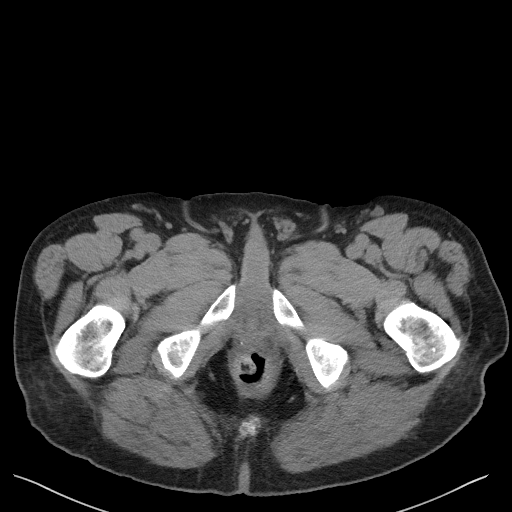
[im 11/94  bone]
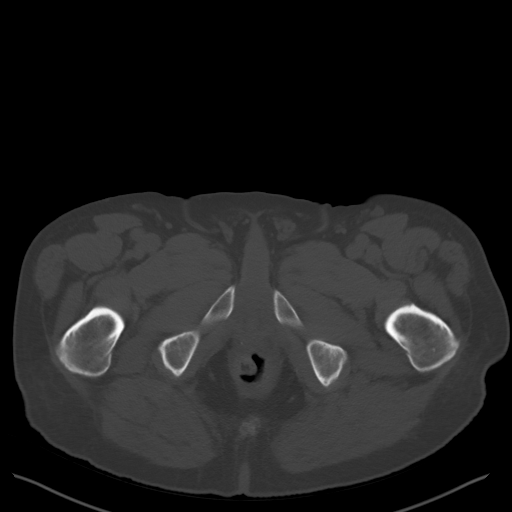
[im 21/94  soft-tissue]
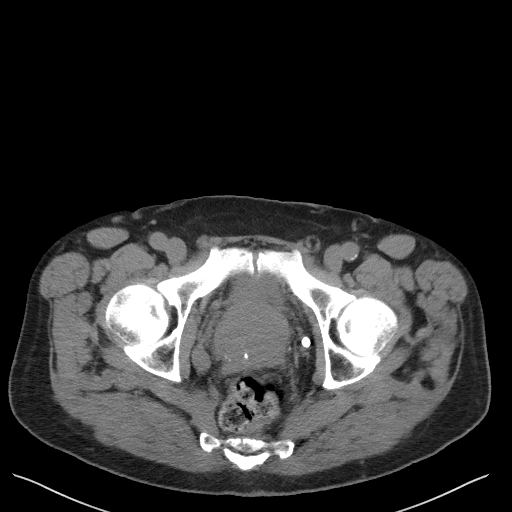
[im 32/94  soft-tissue]
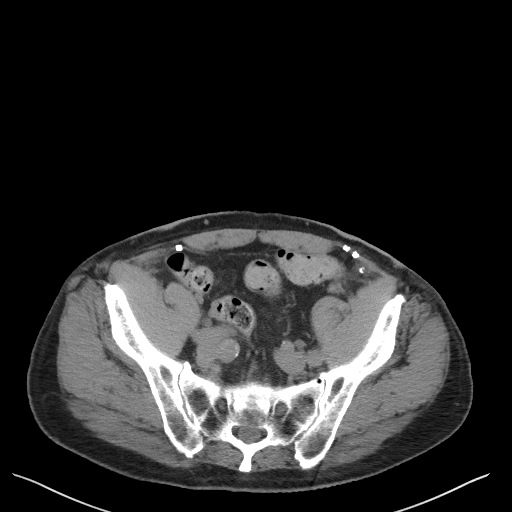
[im 42/94  soft-tissue]
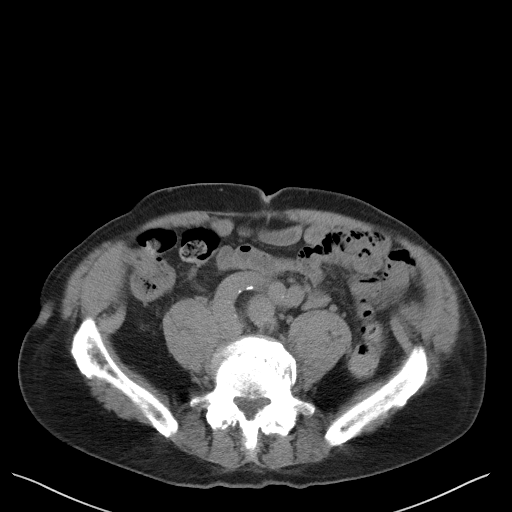
[im 52/94  soft-tissue]
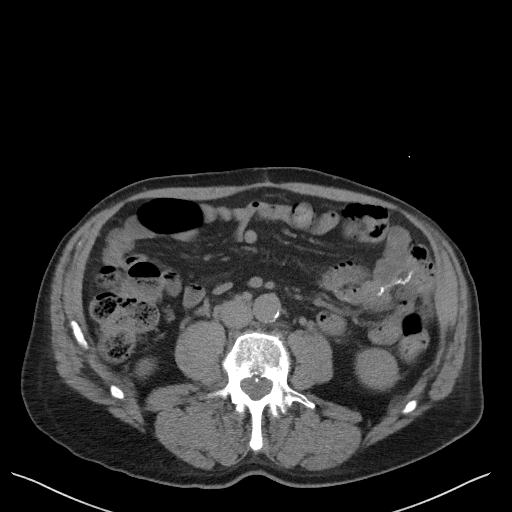
[im 52/94  lung]
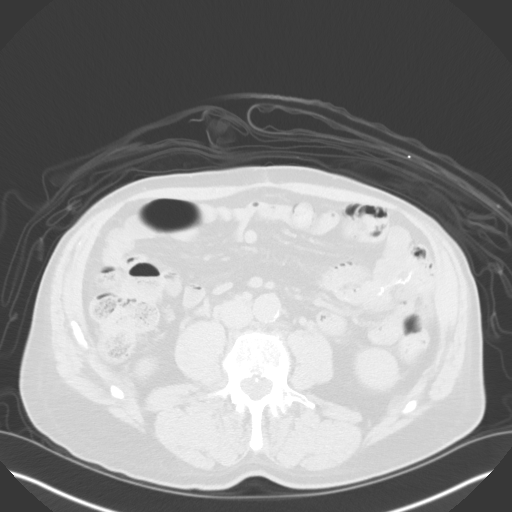
[im 63/94  soft-tissue]
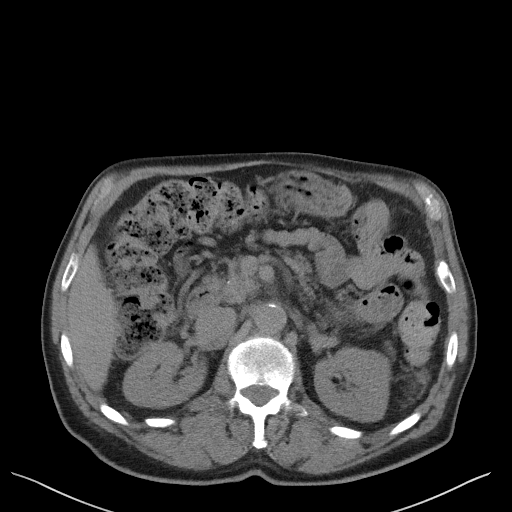
[im 63/94  lung]
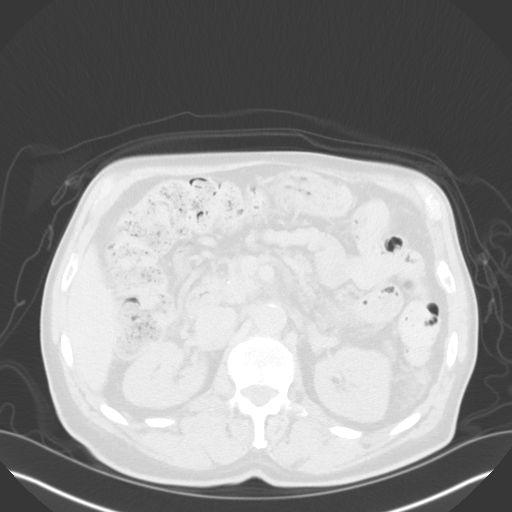
[im 73/94  soft-tissue]
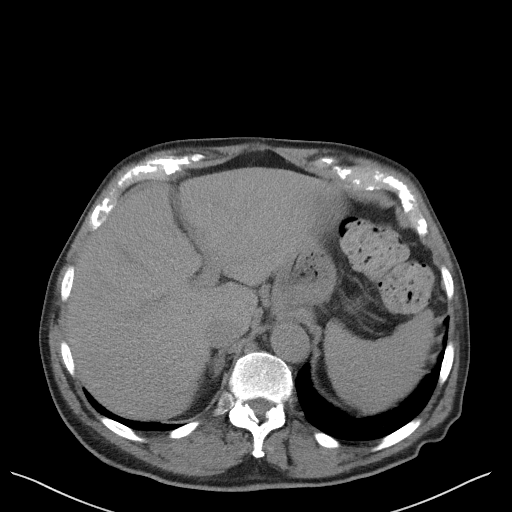
[im 73/94  lung]
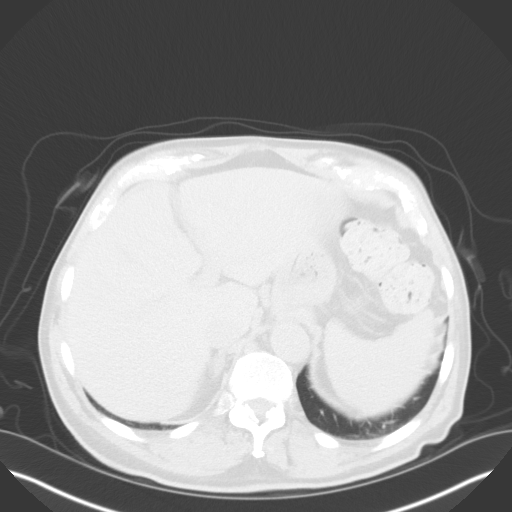
[im 83/94  soft-tissue]
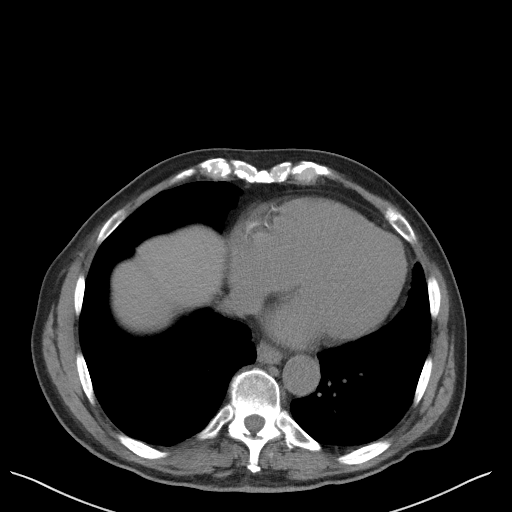
[im 83/94  lung]
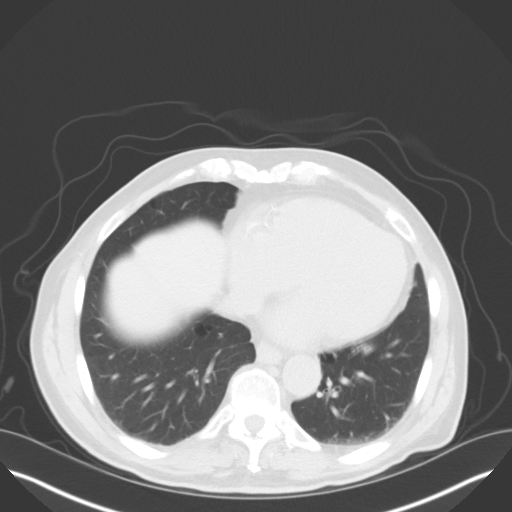

[Series 7: coronal pre · coronal · non-contrast · 0.77mm/px · 2 of 99 slices shown, 3 images]
[im 33/99  soft-tissue]
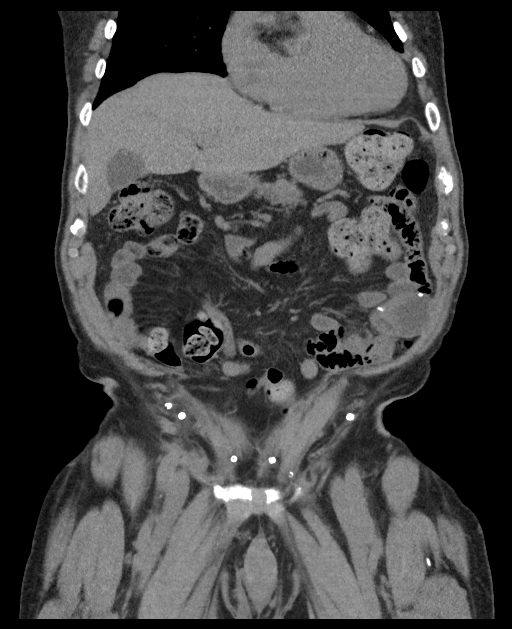
[im 33/99  bone]
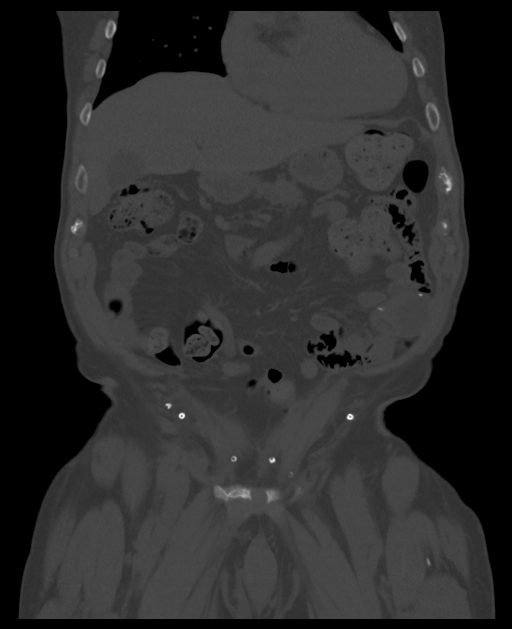
[im 66/99  soft-tissue]
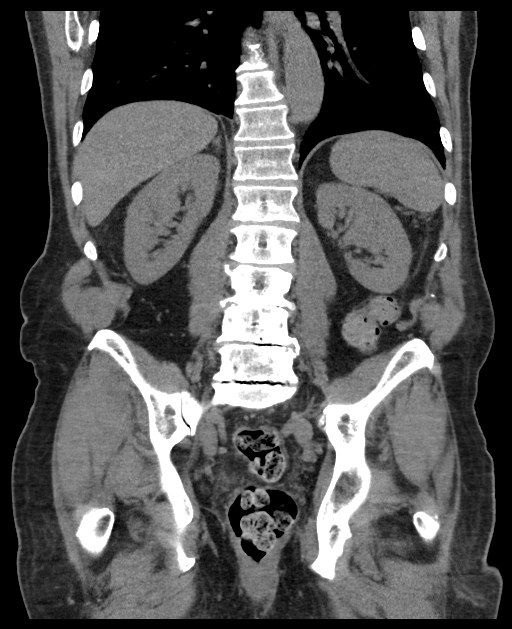

[10 of 46 positions shown; findings below may reference images not displayed]

FINDINGS: Lower chest: Mild emphysematous changes noted throughout the
visualized lung bases. Atherosclerotic calcifications in the left
main, left anterior descending, left circumflex and right coronary
arteries. Small hiatal hernia.

Hepatobiliary: Subcentimeter low-attenuation lesions in segments 2
and 6 of the liver are too small to characterize, but are
statistically likely to represent tiny cysts. Small perfusion
anomaly incidentally noted in the lateral aspect of segment 3
adjacent to the falciform ligament (a benign finding). No suspicious
hepatic lesions are noted. No intra or extrahepatic biliary ductal
dilatation. Gallbladder is normal in appearance.

Pancreas: No pancreatic mass. No pancreatic ductal dilatation. No
pancreatic or peripancreatic fluid or inflammatory changes.

Spleen: Unremarkable.

Adrenals/Urinary Tract: 2 mm nonobstructive calculus in the upper
pole collecting system of the left kidney. No additional
calcifications are noted in the collecting system of the right
kidney or along the course of either ureter. However, there are
several large calculi present dependently in the urinary bladder,
largest of which measures 1.7 x 2.4 cm. Subcentimeter
low-attenuation lesions are noted in both kidneys, too small to
characterize, but statistically likely to represent tiny cysts. No
aggressive renal lesions are noted. No hydroureteronephrosis.
Postcontrast delayed images demonstrate no definite filling defect
within the collecting system of either kidney, along the course of
either ureter, or within the lumen of the urinary bladder to
strongly suggest the presence of a urothelial neoplasm at this time.
Urinary bladder is unremarkable in appearance. Bilateral adrenal
glands are normal in appearance.

Stomach/Bowel: Intraabdominal portion of the stomach is normal in
appearance. There is no pathologic dilatation of small bowel or
colon. Postoperative changes of partial small bowel resection noted,
with a suture line present in the left side of the abdomen
associated with the mid small bowel. Normal appendix.

Vascular/Lymphatic: Aortic atherosclerosis with fusiform ectasia of
the infrarenal abdominal aorta which measures up to 2.4 x 2.6 cm
(mean diameter of 2.5 cm). No lymphadenopathy noted in the abdomen
or pelvis.

Reproductive: Prostate gland is enlarged and there is median lobe
hypertrophy, with the prostate overall measuring 5.0 x 6.3 x 7.6 cm.
Seminal vesicles are unremarkable in appearance.

Other: No significant volume of ascites.  No pneumoperitoneum.

Musculoskeletal: There are no aggressive appearing lytic or blastic
lesions noted in the visualized portions of the skeleton.
IMPRESSION: 1. Three large stones in the urinary bladder measuring up to 1.7 x
2.4 cm. In addition, there is a 2 mm nonobstructive calculus in the
upper pole collecting system of the left kidney. No ureteral stones
or findings of urinary tract obstruction are noted at this time.
2. Prostatomegaly with median lobe hypertrophy.
3. Aortic atherosclerosis, in addition to left main and 3 vessel
coronary artery disease.
4. Emphysema.
5. Additional incidental findings, as above.

## 2018-08-29 ENCOUNTER — Encounter: Payer: Self-pay | Admitting: Occupational Therapy

## 2018-08-29 NOTE — Therapy (Signed)
[place] MAIN [facility] SERVICES [address] [place], [place], [address] Phone: [phone]   Fax:  [phone]  Occupational Therapy Treatment  Patient Details  Name: Luke Durham MRN: [id number] Date of Birth: 09-24-1933 No data recorded  Encounter Date: 08/24/2018  OT End of Session - 08/29/18 1952    Visit Number  [id number]    Number of Visits  156    Date for OT Re-Evaluation  10/12/18    Authorization Type  Medicare visit 8 of 10    OT Start Time  1100    OT Stop Time  1200    OT Time Calculation (min)  60 min    Activity Tolerance  Patient tolerated treatment well    Behavior During Therapy  [facility] for tasks assessed/performed       Past Medical History:  Diagnosis Date  . Anxiety   . Arthritis   . Asthma    childhood asthma  . Cancer ([place]) 7 years ago   lymphoma, [place] (recent)  . Chronic kidney disease   . Colon polyps   . GERD (gastroesophageal reflux disease)   . History of kidney stones   . Parkinson disease [facility])     Past Surgical History:  Procedure Laterality Date  . COLON SURGERY    . CYSTOSCOPY WITH LITHOLAPAXY N/A 11/14/2016   Procedure: CYSTOSCOPY WITH LITHOLAPAXY;  Surgeon: Hollice Espy, MD;  Location: [facility] ORS;  Service: Urology;  Laterality: N/A;  . EYE SURGERY Bilateral    Cataract Extraction with IOL  . HERNIA REPAIR  20 years  . MOHS SURGERY    . SMALL INTESTINE SURGERY     Per patient 7 years  . TRANSURETHRAL RESECTION OF PROSTATE N/A 11/14/2016   Procedure: TRANSURETHRAL RESECTION OF THE PROSTATE (TURP);  Surgeon: Hollice Espy, MD;  Location: [facility] ORS;  Service: Urology;  Laterality: N/A;    There were no vitals filed for this visit.  Subjective Assessment - 08/29/18 1951    Subjective   Patient reports he thinks he is noticing some changes with the medication and not as many episodes of freezing.      Pertinent History  Patient reports he was diagnosed with Parkinson's disease about 10 plus years  ago.  He reports a more recent decline in function in his daily activities in the last 6-12 months.  He has been seeing PT for the last couple months.    Patient Stated Goals  Patient reports he wants to be able to do more for himself and around the house.     Currently in Pain?  No/denies    Pain Score  0-No pain                   OT Treatments/Exercises (OP) - 08/29/18 1959      Neurological Re-education Exercises   Other Exercises 1  Patient seen for instruction of LSVT BIG exercises: LSVT Daily Session Maximal Daily Exercises: Sustained movements are designed to rescale the amplitude of movement output for generalization to daily functional activities. Performed as follows for 1 set of 10 repetitions each: Multi directional sustained movements- 1) Floor to ceiling, 2) Side to side. Multi directional Repetitive movements performed in standing and are designed to provide retraining effort needed for sustained muscle activation in tasks Performed as follows: 3) Step and reach forward, 4) Step and Reach Backwards, 5) Step and reach sideways, 6) Rock and reach forward/backward, 7) Rock and reach sideways.  CGA for exercises in  standing for exercises , verbal and tactile cues to complete with correct form and arm   placements.  Reinstructed on seated exercises for stepping patterns right, left, forwards and back with cues and therapist demonstration, 10 repetitions for 2 trials.  Posture exercises in sitting for pec stretch by therapist, pelvic tilts with facilitation and cues for upright posture.  Exercises on the wall for posture for feedback.     Other Exercises 2  Focusing on turns, calibration of movements, positioning of self in relation to rollator, weight shifting patterns to facilitate calibration of movement patterns.              OT Education - 08/29/18 1952    Education provided  Yes    Education Details  reciprocal stepping in standing and sitting    Person(s) Educated   Patient;Spouse    Methods  Demonstration;Explanation;Verbal cues;Tactile cues    Comprehension  Verbal cues required;Returned demonstration;Verbalized understanding;Tactile cues required          OT Long Term Goals - 07/20/18 0923      OT LONG TERM GOAL #1   Title  Patient will demonstrate increase right hand grip to be able to cut food with modified independence.     Baseline  occasional assist required.    Time  12    Period  Weeks    Status  Partially Met      OT LONG TERM GOAL #2   Title  Patient will complete donning long sleeve shirt with modified independence.    Baseline  requires assist with long sleeves, can perform short sleeves.    Time  12    Period  Weeks    Status  Achieved      OT LONG TERM GOAL #3   Title  Patient will improve coordination to manage buttons on clothing with occasional assistance only.    Baseline  requires assist with buttons each time    Time  12    Period  Weeks    Status  Partially Met      OT LONG TERM GOAL #4   Title  Patient will demonstrate upright posture with shoulders back and head up during exercises with minimal cues.      Baseline  moderate cues for positioning during exercises.     Time  12    Period  Weeks    Status  On-going      OT LONG TERM GOAL #5   Title  Patient will demonstrate turning behaviors with rollator using large steps and keeping feet behind the rollator with all turns with occasional minimal cues.     Time  12    Period  Weeks    Status  On-going      OT LONG TERM GOAL #6   Title  Patient will improve gait speed and endurance and be able to walk 950 feet in 6 minutes to negotiate around the home and community safely in 12 weeks    Baseline  860 at reassessment, 469 at recert 6/29    Time  4    Period  Weeks    Status  On-going      OT LONG TERM GOAL #7   Title  Patient will complete HEP for maximal daily exercises with modified independence in 4 weeks    Time  12    Period  Weeks    Status   Partially Met      OT LONG TERM GOAL #8  Title  Patient will transfer from sit to stand without the use of arms safely and independently from a variety of chairs/surfaces in 4 weeks.     Baseline  difficulty from lower surfaces    Time  4    Period  Weeks    Status  Achieved      OT LONG TERM GOAL  #9   Baseline  Patient will demonstrate decreased episodes of freezing of behaviors from score of 16 to score of less than 12.    Time  4    Period  Weeks    Status  Achieved              Patient will benefit from skilled therapeutic intervention in order to improve the following deficits and impairments:     Visit Diagnosis: Muscle weakness (generalized)  Other lack of coordination  Unsteadiness on feet  Abnormal posture  Difficulty in walking, not elsewhere classified    Problem List Patient Active Problem List   Diagnosis Date Noted  . Dementia due to Parkinson's disease without behavioral disturbance ([place]) 07/18/2017  . Neurogenic orthostatic hypotension ([place]) 12/02/2016  . BPH with urinary obstruction 11/14/2016  . Urinary frequency 10/10/2016  . Microscopic hematuria 10/10/2016  . Parkinson's syndrome ([place]) 06/14/2016  . Arthritis 06/14/2016  . Depression 06/14/2016  . Heartburn 06/14/2016  . Urinary incontinence 06/14/2016  . Skin lesion 06/14/2016  . Dementia ([place]) 06/14/2016  . Bilateral edema of lower extremity 06/14/2016   Achilles Dunk, OTR/L, CLT  Lovett,Amy 08/29/2018, 8:01 PM  [place] MAIN [facility] SERVICES [address] [place], [place], [address] Phone: [phone]   Fax:  [phone]  Name: Luke Durham MRN: [id number] Date of Birth: September 21, 1933

## 2018-08-31 ENCOUNTER — Ambulatory Visit: Payer: Medicare Other | Admitting: Occupational Therapy

## 2018-08-31 DIAGNOSIS — R262 Difficulty in walking, not elsewhere classified: Secondary | ICD-10-CM

## 2018-08-31 DIAGNOSIS — M6281 Muscle weakness (generalized): Secondary | ICD-10-CM | POA: Diagnosis not present

## 2018-08-31 DIAGNOSIS — R293 Abnormal posture: Secondary | ICD-10-CM

## 2018-08-31 DIAGNOSIS — R2681 Unsteadiness on feet: Secondary | ICD-10-CM

## 2018-08-31 DIAGNOSIS — R278 Other lack of coordination: Secondary | ICD-10-CM

## 2018-09-01 ENCOUNTER — Encounter: Payer: Self-pay | Admitting: Occupational Therapy

## 2018-09-01 NOTE — Therapy (Signed)
[place] MAIN [facility] SERVICES [address] [place], [place], [address] Phone: [phone]   Fax:  [phone]  Occupational Therapy Treatment  Patient Details  Name: Luke Durham MRN: [id number] Date of Birth: November 06, 1932 No data recorded  Encounter Date: 08/28/2018  OT End of Session - 09/01/18 1020    Visit Number  149    Number of Visits  156    Date for OT Re-Evaluation  10/12/18    Authorization Type  Medicare visit 9 of 10    OT Start Time  1101    OT Stop Time  1200    OT Time Calculation (min)  59 min    Activity Tolerance  Patient tolerated treatment well    Behavior During Therapy  [facility] for tasks assessed/performed       Past Medical History:  Diagnosis Date  . Anxiety   . Arthritis   . Asthma    childhood asthma  . Cancer ([place]) 7 years ago   lymphoma, [place] (recent)  . Chronic kidney disease   . Colon polyps   . GERD (gastroesophageal reflux disease)   . History of kidney stones   . Parkinson disease [facility])     Past Surgical History:  Procedure Laterality Date  . COLON SURGERY    . CYSTOSCOPY WITH LITHOLAPAXY N/A 11/14/2016   Procedure: CYSTOSCOPY WITH LITHOLAPAXY;  Surgeon: Hollice Espy, MD;  Location: [facility] ORS;  Service: Urology;  Laterality: N/A;  . EYE SURGERY Bilateral    Cataract Extraction with IOL  . HERNIA REPAIR  20 years  . MOHS SURGERY    . SMALL INTESTINE SURGERY     Per patient 7 years  . TRANSURETHRAL RESECTION OF PROSTATE N/A 11/14/2016   Procedure: TRANSURETHRAL RESECTION OF THE PROSTATE (TURP);  Surgeon: Hollice Espy, MD;  Location: [facility] ORS;  Service: Urology;  Laterality: N/A;    There were no vitals filed for this visit.  Subjective Assessment - 09/01/18 1019    Subjective   Patient reports he feels encouraged today because he feels like the change in his medication regime is working . No complaints this date.     Pertinent History  Patient reports he was diagnosed with Parkinson's  disease about 10 plus years ago.  He reports a more recent decline in function in his daily activities in the last 6-12 months.  He has been seeing PT for the last couple months.    Patient Stated Goals  Patient reports he wants to be able to do more for himself and around the house.     Currently in Pain?  No/denies    Pain Score  0-No pain                   OT Treatments/Exercises (OP) - 09/01/18 1022      Neurological Re-education Exercises   Other Exercises 1  Patient seen for instruction of LSVT BIG exercises: LSVT Daily Session Maximal Daily Exercises: Sustained movements are designed to rescale the amplitude of movement output for generalization to daily functional activities. Performed as follows for 1 set of 10 repetitions each: Multi directional sustained movements- 1) Floor to ceiling, 2) Side to side. Multi directional Repetitive movements performed in standing and are designed to provide retraining effort needed for sustained muscle activation in tasks Performed as follows: 3) Step and reach forward, 4) Step and Reach Backwards, 5) Step and reach sideways, 6) Rock and reach forward/backward, 7) Rock and reach sideways.  CGA  for exercises in standing for exercises , verbal and tactile cues to complete with correct form and arm   placements.  Reinstructed on seated exercises for stepping patterns right, left, forwards and back with cues and therapist demonstration, 10 repetitions for 2 trials.     Other Exercises 2  Stepping patterns in multidirections from seated position, leg crossing to reach to shoes for tying, postural stretches with therapist providing manual stretch to pectoralis.  Focused this date on initiation of gait with use of rollator, cues at times for weight shifting back towards heels rather than forwards onto toes.              OT Education - 09/01/18 1020    Education provided  Yes    Education Details  reciprocal stepping in standing and sitting,  initiation of gait with rollator    Person(s) Educated  Patient;Spouse    Methods  Demonstration;Explanation;Verbal cues;Tactile cues    Comprehension  Verbal cues required;Returned demonstration;Verbalized understanding;Tactile cues required          OT Long Term Goals - 07/20/18 0923      OT LONG TERM GOAL #1   Title  Patient will demonstrate increase right hand grip to be able to cut food with modified independence.     Baseline  occasional assist required.    Time  12    Period  Weeks    Status  Partially Met      OT LONG TERM GOAL #2   Title  Patient will complete donning long sleeve shirt with modified independence.    Baseline  requires assist with long sleeves, can perform short sleeves.    Time  12    Period  Weeks    Status  Achieved      OT LONG TERM GOAL #3   Title  Patient will improve coordination to manage buttons on clothing with occasional assistance only.    Baseline  requires assist with buttons each time    Time  12    Period  Weeks    Status  Partially Met      OT LONG TERM GOAL #4   Title  Patient will demonstrate upright posture with shoulders back and head up during exercises with minimal cues.      Baseline  moderate cues for positioning during exercises.     Time  12    Period  Weeks    Status  On-going      OT LONG TERM GOAL #5   Title  Patient will demonstrate turning behaviors with rollator using large steps and keeping feet behind the rollator with all turns with occasional minimal cues.     Time  12    Period  Weeks    Status  On-going      OT LONG TERM GOAL #6   Title  Patient will improve gait speed and endurance and be able to walk 950 feet in 6 minutes to negotiate around the home and community safely in 12 weeks    Baseline  860 at reassessment, 833 at recert 8/25    Time  4    Period  Weeks    Status  On-going      OT LONG TERM GOAL #7   Title  Patient will complete HEP for maximal daily exercises with modified independence in  4 weeks    Time  12    Period  Weeks    Status  Partially Met  OT LONG TERM GOAL #8   Title  Patient will transfer from sit to stand without the use of arms safely and independently from a variety of chairs/surfaces in 4 weeks.     Baseline  difficulty from lower surfaces    Time  4    Period  Weeks    Status  Achieved      OT LONG TERM GOAL  #9   Baseline  Patient will demonstrate decreased episodes of freezing of behaviors from score of 16 to score of less than 12.    Time  4    Period  Weeks    Status  Achieved            Plan - 09/01/18 1021    Clinical Impression Statement  105/64 BP this date.  Patient reports feeling optimistic regarding his medication changes in regards to freezing of gait and feels the frequency has decreased and duration hasn't lasted as long.  Continue to work towards goals to improve amplitude of gait, reduce the risk of fall and increase independence in daily activities.     Occupational Profile and client history currently impacting functional performance  progressive disease process, freezing of gait behaviors, fall risk    Occupational performance deficits (Please refer to evaluation for details):  ADL's;IADL's;Leisure    Rehab Potential  Good    Current Impairments/barriers affecting progress:  positive: family support, negative: level of motivation, progressive disease    OT Frequency  2x / week    OT Duration  12 weeks    OT Treatment/Interventions  Self-care/ADL training;Moist Heat;DME and/or AE instruction;Balance training;Therapeutic activities;Therapeutic exercise;Neuromuscular education;Functional Mobility Training;Patient/family education    Consulted and Agree with Plan of Care  Patient       Patient will benefit from skilled therapeutic intervention in order to improve the following deficits and impairments:  Abnormal gait, Decreased coordination, Decreased range of motion, Difficulty walking, Decreased endurance, Decreased safety  awareness, Decreased activity tolerance, Decreased balance, Impaired UE functional use, Pain, Decreased mobility, Decreased strength  Visit Diagnosis: Muscle weakness (generalized)  Other lack of coordination  Unsteadiness on feet  Abnormal posture  Difficulty in walking, not elsewhere classified    Problem List Patient Active Problem List   Diagnosis Date Noted  . Dementia due to Parkinson's disease without behavioral disturbance ([place]) 07/18/2017  . Neurogenic orthostatic hypotension ([place]) 12/02/2016  . BPH with urinary obstruction 11/14/2016  . Urinary frequency 10/10/2016  . Microscopic hematuria 10/10/2016  . Parkinson's syndrome ([place]) 06/14/2016  . Arthritis 06/14/2016  . Depression 06/14/2016  . Heartburn 06/14/2016  . Urinary incontinence 06/14/2016  . Skin lesion 06/14/2016  . Dementia ([place]) 06/14/2016  . Bilateral edema of lower extremity 06/14/2016   Achilles Dunk, OTR/L, CLT  Lovett,Amy 09/01/2018, 10:29 AM  [place] MAIN [facility] SERVICES [address] [place], [place], [address] Phone: [phone]   Fax:  [phone]  Name: Keldon Lassen MRN: [id number] Date of Birth: [birth year]

## 2018-09-01 NOTE — Therapy (Signed)
[place] MAIN [facility] SERVICES [address] [place], [place], [address] Phone: [phone]   Fax:  [phone]  Occupational Therapy Treatment/10th Visit Progress update Reporting period from 07/20/2018 to 08/31/2018  Patient Details  Name: Luke Durham MRN: [id number] Date of Birth: [birth year] No data recorded  Encounter Date: 08/31/2018  OT End of Session - 09/01/18 1557    Visit Number  150    Number of Visits  156    Date for OT Re-Evaluation  10/12/18    Authorization Type  Medicare visit 10 of 10, reporting period started 07/20/2018    OT Start Time  1100    OT Stop Time  1204    OT Time Calculation (min)  64 min    Activity Tolerance  Patient tolerated treatment well    Behavior During Therapy  [facility] for tasks assessed/performed       Past Medical History:  Diagnosis Date  . Anxiety   . Arthritis   . Asthma    childhood asthma  . Cancer ([place]) 7 years ago   lymphoma, [place] (recent)  . Chronic kidney disease   . Colon polyps   . GERD (gastroesophageal reflux disease)   . History of kidney stones   . Parkinson disease [facility])     Past Surgical History:  Procedure Laterality Date  . COLON SURGERY    . CYSTOSCOPY WITH LITHOLAPAXY N/A 11/14/2016   Procedure: CYSTOSCOPY WITH LITHOLAPAXY;  Surgeon: Hollice Espy, MD;  Location: [facility] ORS;  Service: Urology;  Laterality: N/A;  . EYE SURGERY Bilateral    Cataract Extraction with IOL  . HERNIA REPAIR  20 years  . MOHS SURGERY    . SMALL INTESTINE SURGERY     Per patient 7 years  . TRANSURETHRAL RESECTION OF PROSTATE N/A 11/14/2016   Procedure: TRANSURETHRAL RESECTION OF THE PROSTATE (TURP);  Surgeon: Hollice Espy, MD;  Location: [facility] ORS;  Service: Urology;  Laterality: N/A;    There were no vitals filed for this visit.  Subjective Assessment - 09/01/18 1554    Subjective   Patient states, "I have a confession, I fell yesterday".  Patient reports he was eating dinner and got  choked and tried to walk to the kitchen to spit out the rest of the food he had in his mouth and fell in the process of rushing, he denies injury but reports he did get a bump on his head.  He also reports he was not able to get up by himself, his wife helped him but it was a struggle.     Pertinent History  Patient reports he was diagnosed with Parkinson's disease about 10 plus years ago.  He reports a more recent decline in function in his daily activities in the last 6-12 months.  He has been seeing PT for the last couple months.    Patient Stated Goals  Patient reports he wants to be able to do more for himself and around the house.     Currently in Pain?  No/denies    Pain Score  0-No pain                   OT Treatments/Exercises (OP) - 09/01/18 1615      ADLs   ADL Comments  Posture exercises in sitting with manual stretch from therapist, shoe tying exercise for 5 reps from seated position.  Analysis of fall from last date and appears patient was anxious and moving quickly to the kitchen  to spit out food he was choking on from eating too fast.  Discussed in the future to use a napkin to spit out food or consider cleaning up a small mess versus risking falling.  Will work on floor transfers in coming sessions since patient could not get up without assist.        Neurological Re-education Exercises   Other Exercises 1  Patient seen for instruction of LSVT BIG exercises: LSVT Daily Session Maximal Daily Exercises: Sustained movements are designed to rescale the amplitude of movement output for generalization to daily functional activities. Performed as follows for 1 set of 10 repetitions each: Multi directional sustained movements- 1) Floor to ceiling, 2) Side to side. Multi directional Repetitive movements performed in standing and are designed to provide retraining effort needed for sustained muscle activation in tasks Performed as follows: 3) Step and reach forward, 4) Step and Reach  Backwards, 5) Step and reach sideways, 6) Rock and reach forward/backward, 7) Rock and reach sideways.  CGA for exercises in standing for exercises , verbal and tactile cues to complete with correct form and arm   placements.  Reinstructed on seated exercises for stepping patterns right, left, forwards and back with cues and therapist demonstration, 10 repetitions for 2 trials.     Other Exercises 2  Resistive reciprocal LE and UE for 5 mins        Blood pressure at start of session:  133/68 Supine after patient reported dizziness 114/59 Sitting:  110/68 Standing:  107/80       OT Education - 09/01/18 1556    Education provided  Yes    Education Details  analysis of fall, freezing of gait when rushed or anxious, safety    Person(s) Educated  Patient;Spouse    Methods  Demonstration;Explanation;Verbal cues;Tactile cues    Comprehension  Verbal cues required;Returned demonstration;Verbalized understanding;Tactile cues required          OT Long Term Goals - 09/01/18 1558      OT LONG TERM GOAL #1   Title  Patient will demonstrate increase right hand grip to be able to cut food with modified independence.     Baseline  occasional assist required.    Time  12    Period  Weeks    Status  On-going      OT LONG TERM GOAL #2   Title  Patient will complete donning long sleeve shirt with modified independence.    Baseline  requires assist with long sleeves, can perform short sleeves.    Time  12    Period  Weeks    Status  Achieved      OT LONG TERM GOAL #3   Title  Patient will improve coordination to manage buttons on clothing with occasional assistance only.    Baseline  requires assist with buttons each time    Time  12    Period  Weeks    Status  On-going      OT LONG TERM GOAL #4   Title  Patient will demonstrate upright posture with shoulders back and head up during exercises with minimal cues.      Baseline  moderate cues for positioning during exercises.     Time   12    Period  Weeks    Status  On-going      OT LONG TERM GOAL #5   Title  Patient will demonstrate turning behaviors with rollator using large steps and keeping feet behind the rollator  with all turns with occasional minimal cues.     Time  12    Period  Weeks    Status  On-going      OT LONG TERM GOAL #6   Title  Patient will improve gait speed and endurance and be able to walk 950 feet in 6 minutes to negotiate around the home and community safely in 12 weeks    Baseline  860 at reassessment, 382 at recert 5/05    Time  4    Period  Weeks    Status  On-going      OT LONG TERM GOAL #7   Title  Patient will complete HEP for maximal daily exercises with modified independence in 4 weeks    Time  12    Period  Weeks    Status  Partially Met      OT LONG TERM GOAL #8   Title  Patient will transfer from sit to stand without the use of arms safely and independently from a variety of chairs/surfaces in 4 weeks.     Baseline  difficulty from lower surfaces    Time  4    Period  Weeks    Status  Achieved      OT LONG TERM GOAL  #9   Baseline  Patient will demonstrate floor transfers with use of chair to assist with min guard and cues if needed.     Time  12    Period  Weeks    Status  New            Plan - 09/01/18 1558    Clinical Impression Statement  Patient reported episode of dizziness during exercises in tx session, positioned patient in supine and monitored blood pressure in supine, sitting and standing this date.  His BP remained in normal ranges and episode was brief and then patient reported he felt normal. Advised patient and wife to keep a check on BP, make sure patient is hydrated and to follow up with medical care if symptoms worsen.  Patient denied any further symptoms by end of session and reported feeling normal upon departure from the clinic.  Patient continues to work towards improving posture, balance, functional mobility and independence in daily tasks.  Did  not perform functional mobility for long distances this date due to episode of dizziness.  Patient continues to benefit from skilled OT to maximize safety and independence in daily tasks.  Patient denied any dizziness with reported fall last date and felt it was more from anxiety and rushing to get to kitchen.  Discussed future sessions to include more timed trials of mobility to see if we can diminish the stimulus of this on his performance.     Occupational Profile and client history currently impacting functional performance  progressive disease process, freezing of gait behaviors, fall risk    Occupational performance deficits (Please refer to evaluation for details):  ADL's;IADL's;Leisure    Rehab Potential  Good    Current Impairments/barriers affecting progress:  positive: family support, negative: level of motivation, progressive disease    OT Frequency  2x / week    OT Duration  12 weeks    OT Treatment/Interventions  Self-care/ADL training;Moist Heat;DME and/or AE instruction;Balance training;Therapeutic activities;Therapeutic exercise;Neuromuscular education;Functional Mobility Training;Patient/family education    Consulted and Agree with Plan of Care  Patient    Family Member Consulted  wife, Gordy Levan       Patient will benefit from skilled therapeutic intervention in order  to improve the following deficits and impairments:  Abnormal gait, Decreased coordination, Decreased range of motion, Difficulty walking, Decreased endurance, Decreased safety awareness, Decreased activity tolerance, Decreased balance, Impaired UE functional use, Pain, Decreased mobility, Decreased strength  Visit Diagnosis: Muscle weakness (generalized)  Other lack of coordination  Unsteadiness on feet  Abnormal posture  Difficulty in walking, not elsewhere classified    Problem List Patient Active Problem List   Diagnosis Date Noted  . Dementia due to Parkinson's disease without behavioral disturbance  ([place]) 07/18/2017  . Neurogenic orthostatic hypotension ([place]) 12/02/2016  . BPH with urinary obstruction 11/14/2016  . Urinary frequency 10/10/2016  . Microscopic hematuria 10/10/2016  . Parkinson's syndrome ([place]) 06/14/2016  . Arthritis 06/14/2016  . Depression 06/14/2016  . Heartburn 06/14/2016  . Urinary incontinence 06/14/2016  . Skin lesion 06/14/2016  . Dementia ([place]) 06/14/2016  . Bilateral edema of lower extremity 06/14/2016   Achilles Dunk, OTR/L, CLT  Lovett,Amy 09/01/2018, 4:20 PM  [place] MAIN [facility] SERVICES [address] [place], [place], [address] Phone: [phone]   Fax:  [phone]  Name: Luke Durham MRN: [id number] Date of Birth: 23-Jan-1933

## 2018-09-03 ENCOUNTER — Ambulatory Visit: Payer: Medicare Other | Admitting: Occupational Therapy

## 2018-09-03 DIAGNOSIS — R293 Abnormal posture: Secondary | ICD-10-CM

## 2018-09-03 DIAGNOSIS — R262 Difficulty in walking, not elsewhere classified: Secondary | ICD-10-CM

## 2018-09-03 DIAGNOSIS — M6281 Muscle weakness (generalized): Secondary | ICD-10-CM

## 2018-09-03 DIAGNOSIS — R278 Other lack of coordination: Secondary | ICD-10-CM

## 2018-09-03 DIAGNOSIS — R2681 Unsteadiness on feet: Secondary | ICD-10-CM

## 2018-09-06 ENCOUNTER — Ambulatory Visit: Payer: Medicare Other | Admitting: Occupational Therapy

## 2018-09-06 DIAGNOSIS — M6281 Muscle weakness (generalized): Secondary | ICD-10-CM | POA: Diagnosis not present

## 2018-09-06 DIAGNOSIS — R262 Difficulty in walking, not elsewhere classified: Secondary | ICD-10-CM

## 2018-09-06 DIAGNOSIS — R293 Abnormal posture: Secondary | ICD-10-CM

## 2018-09-06 DIAGNOSIS — R278 Other lack of coordination: Secondary | ICD-10-CM

## 2018-09-06 DIAGNOSIS — R2681 Unsteadiness on feet: Secondary | ICD-10-CM

## 2018-09-10 ENCOUNTER — Ambulatory Visit: Payer: Medicare Other | Admitting: Occupational Therapy

## 2018-09-14 ENCOUNTER — Encounter: Payer: Medicare Other | Admitting: Occupational Therapy

## 2018-09-15 ENCOUNTER — Encounter: Payer: Self-pay | Admitting: Occupational Therapy

## 2018-09-15 NOTE — Therapy (Signed)
[place] MAIN [facility] SERVICES [address] [place], [place], [address] Phone: [phone]   Fax:  [phone]  Occupational Therapy Treatment  Patient Details  Name: Luke Durham MRN: [id number] Date of Birth: 1933-07-01 No data recorded  Encounter Date: 09/06/2018  OT End of Session - 09/15/18 0833    Visit Number  152    Number of Visits  156    Date for OT Re-Evaluation  10/12/18    Authorization Type  Medicare visit 2 of 10, reporting period started 07/20/2018    OT Start Time  1003    OT Stop Time  1100    OT Time Calculation (min)  57 min    Activity Tolerance  Patient tolerated treatment well    Behavior During Therapy  [facility] for tasks assessed/performed       Past Medical History:  Diagnosis Date  . Anxiety   . Arthritis   . Asthma    childhood asthma  . Cancer ([place]) 7 years ago   lymphoma, [place] (recent)  . Chronic kidney disease   . Colon polyps   . GERD (gastroesophageal reflux disease)   . History of kidney stones   . Parkinson disease [facility])     Past Surgical History:  Procedure Laterality Date  . COLON SURGERY    . CYSTOSCOPY WITH LITHOLAPAXY N/A 11/14/2016   Procedure: CYSTOSCOPY WITH LITHOLAPAXY;  Surgeon: Hollice Espy, MD;  Location: [facility] ORS;  Service: Urology;  Laterality: N/A;  . EYE SURGERY Bilateral    Cataract Extraction with IOL  . HERNIA REPAIR  20 years  . MOHS SURGERY    . SMALL INTESTINE SURGERY     Per patient 7 years  . TRANSURETHRAL RESECTION OF PROSTATE N/A 11/14/2016   Procedure: TRANSURETHRAL RESECTION OF THE PROSTATE (TURP);  Surgeon: Hollice Espy, MD;  Location: [facility] ORS;  Service: Urology;  Laterality: N/A;    There were no vitals filed for this visit.  Subjective Assessment - 09/15/18 0829    Subjective   Patient reports he may miss one of his appts next week, he is planning on going with his son to the [facility] in [place] as a part of their Christmas present.     Pertinent  History  Patient reports he was diagnosed with Parkinson's disease about 10 plus years ago.  He reports a more recent decline in function in his daily activities in the last 6-12 months.  He has been seeing PT for the last couple months.    Patient Stated Goals  Patient reports he wants to be able to do more for himself and around the house.     Currently in Pain?  No/denies    Multiple Pain Sites  No                   OT Treatments/Exercises (OP) - 09/15/18 0830      ADLs   ADL Comments  Patient seen for sit to stand from mat table, waiting room chair and bench for 5 repetitions for each surface, cues for weight shift forwards with transition, patient tends to keep weight forwards onto toes and requires cues to shift back to heels.  Patient demonstrating cross leg method for 5 repetitions each leg to reach to shoes to tie laces.  Posture exercises in standing with cues both verbally and tactile.       Neurological Re-education Exercises   Other Exercises 1  Patient seen for instruction of LSVT BIG  exercises: LSVT Daily Session Maximal Daily Exercises: Sustained movements are designed to rescale the amplitude of movement output for generalization to daily functional activities. Performed as follows for 1 set of 10 repetitions each: Multi directional sustained movements- 1) Floor to ceiling, 2) Side to side. Multi directional Repetitive movements performed in standing and are designed to provide retraining effort needed for sustained muscle activation in tasks Performed as follows: 3) Step and reach forward, 4) Step and Reach Backwards, 5) Step and reach sideways, 6) Rock and reach forward/backward, 7) Rock and reach sideways.  CGA to min assist for balance when performing exercises in standing.     Other Exercises 2  Trials of timed functional mobility with rollator, patient's performance tends to worsen with the demand of being timed or feeling rushed. Increased freezing of gait present  during these trials.              OT Education - 09/15/18 [phone]    Education provided  Yes    Education Details  timed movement patterns    Person(s) Educated  Patient;Spouse    Methods  Demonstration;Explanation;Verbal cues;Tactile cues    Comprehension  Verbal cues required;Returned demonstration;Verbalized understanding;Tactile cues required          OT Long Term Goals - 09/01/18 1558      OT LONG TERM GOAL #1   Title  Patient will demonstrate increase right hand grip to be able to cut food with modified independence.     Baseline  occasional assist required.    Time  12    Period  Weeks    Status  On-going      OT LONG TERM GOAL #2   Title  Patient will complete donning long sleeve shirt with modified independence.    Baseline  requires assist with long sleeves, can perform short sleeves.    Time  12    Period  Weeks    Status  Achieved      OT LONG TERM GOAL #3   Title  Patient will improve coordination to manage buttons on clothing with occasional assistance only.    Baseline  requires assist with buttons each time    Time  12    Period  Weeks    Status  On-going      OT LONG TERM GOAL #4   Title  Patient will demonstrate upright posture with shoulders back and head up during exercises with minimal cues.      Baseline  moderate cues for positioning during exercises.     Time  12    Period  Weeks    Status  On-going      OT LONG TERM GOAL #5   Title  Patient will demonstrate turning behaviors with rollator using large steps and keeping feet behind the rollator with all turns with occasional minimal cues.     Time  12    Period  Weeks    Status  On-going      OT LONG TERM GOAL #6   Title  Patient will improve gait speed and endurance and be able to walk 950 feet in 6 minutes to negotiate around the home and community safely in 12 weeks    Baseline  860 at reassessment, 786 at recert 7/67    Time  4    Period  Weeks    Status  On-going      OT LONG  TERM GOAL #7   Title  Patient will complete HEP  for maximal daily exercises with modified independence in 4 weeks    Time  12    Period  Weeks    Status  Partially Met      OT LONG TERM GOAL #8   Title  Patient will transfer from sit to stand without the use of arms safely and independently from a variety of chairs/surfaces in 4 weeks.     Baseline  difficulty from lower surfaces    Time  4    Period  Weeks    Status  Achieved      OT LONG TERM GOAL  #9   Baseline  Patient will demonstrate floor transfers with use of chair to assist with min guard and cues if needed.     Time  12    Period  Weeks    Status  New            Plan - 09/15/18 0834    Clinical Impression Statement  Patient planning to go on short trip with wife and patient's son to the [facility] in [place].  He reports some worry over walking and risk of fall.  Son has been in during his sessions in the past and familiar with cues to provide patient and they did well with the last trip in the summer to [facility].  Focused some this date on timed ambulation to work on diminishing and extinguishing pattern of freezing of gait when timed or feeling rushed.  Patient demonstrated increased freezing with trials this date but responds well to cues. Continue to work towards goals.     Occupational Profile and client history currently impacting functional performance  progressive disease process, freezing of gait behaviors, fall risk    Occupational performance deficits (Please refer to evaluation for details):  ADL's;IADL's;Leisure    Rehab Potential  Good       Patient will benefit from skilled therapeutic intervention in order to improve the following deficits and impairments:  Abnormal gait, Decreased coordination, Decreased range of motion, Difficulty walking, Decreased endurance, Decreased safety awareness, Decreased activity tolerance, Decreased balance, Impaired UE functional use, Pain, Decreased mobility,  Decreased strength  Visit Diagnosis: Muscle weakness (generalized)  Other lack of coordination  Unsteadiness on feet  Abnormal posture  Difficulty in walking, not elsewhere classified    Problem List Patient Active Problem List   Diagnosis Date Noted  . Dementia due to Parkinson's disease without behavioral disturbance ([place]) 07/18/2017  . Neurogenic orthostatic hypotension ([place]) 12/02/2016  . BPH with urinary obstruction 11/14/2016  . Urinary frequency 10/10/2016  . Microscopic hematuria 10/10/2016  . Parkinson's syndrome ([place]) 06/14/2016  . Arthritis 06/14/2016  . Depression 06/14/2016  . Heartburn 06/14/2016  . Urinary incontinence 06/14/2016  . Skin lesion 06/14/2016  . Dementia ([place]) 06/14/2016  . Bilateral edema of lower extremity 06/14/2016   Achilles Dunk, OTR/L, CLT  Lovett,Amy 09/15/2018, 8:38 AM  [place] MAIN [facility] SERVICES [address] [place], [place], [address] Phone: [phone]   Fax:  [phone]  Name: Luke Durham MRN: [id number] Date of Birth: Nov 08, 1932

## 2018-09-15 NOTE — Therapy (Signed)
[place] MAIN [facility] SERVICES [address] [place], [place], [address] Phone: [phone]   Fax:  [phone]  Occupational Therapy Treatment  Patient Details  Name: Luke Durham MRN: [id number] Date of Birth: January 13, 1933 No data recorded  Encounter Date: 09/03/2018  OT End of Session - 09/15/18 0824    Visit Number  [id number]    Number of Visits  156    Date for OT Re-Evaluation  10/12/18    Authorization Type  Medicare visit 1 of 10, reporting period started 07/20/2018    OT Start Time  1001    OT Stop Time  1100    OT Time Calculation (min)  59 min    Activity Tolerance  Patient tolerated treatment well    Behavior During Therapy  [facility] for tasks assessed/performed       Past Medical History:  Diagnosis Date  . Anxiety   . Arthritis   . Asthma    childhood asthma  . Cancer ([place]) 7 years ago   lymphoma, [place] (recent)  . Chronic kidney disease   . Colon polyps   . GERD (gastroesophageal reflux disease)   . History of kidney stones   . Parkinson disease [facility])     Past Surgical History:  Procedure Laterality Date  . COLON SURGERY    . CYSTOSCOPY WITH LITHOLAPAXY N/A 11/14/2016   Procedure: CYSTOSCOPY WITH LITHOLAPAXY;  Surgeon: Hollice Espy, MD;  Location: [facility] ORS;  Service: Urology;  Laterality: N/A;  . EYE SURGERY Bilateral    Cataract Extraction with IOL  . HERNIA REPAIR  20 years  . MOHS SURGERY    . SMALL INTESTINE SURGERY     Per patient 7 years  . TRANSURETHRAL RESECTION OF PROSTATE N/A 11/14/2016   Procedure: TRANSURETHRAL RESECTION OF THE PROSTATE (TURP);  Surgeon: Hollice Espy, MD;  Location: [facility] ORS;  Service: Urology;  Laterality: N/A;    There were no vitals filed for this visit.  Subjective Assessment - 09/15/18 0817    Subjective   Patient denies any new falls, doing well.      Pertinent History  Patient reports he was diagnosed with Parkinson's disease about 10 plus years ago.  He reports a more recent  decline in function in his daily activities in the last 6-12 months.  He has been seeing PT for the last couple months.    Patient Stated Goals  Patient reports he wants to be able to do more for himself and around the house.     Currently in Pain?  No/denies    Pain Score  0-No pain                   OT Treatments/Exercises (OP) - 09/15/18 0818      ADLs   ADL Comments  Patient seen for sit to stand from mat table, waiting room chair and bench for 5 repetitions for each surface, cues for weight shift forwards with transition, patient tends to keep weight forwards onto toes and requires cues to shift back to heels.  Patient demonstrating cross leg method for 5 repetitions each leg to reach to shoes to tie laces.  Backwards step to approach toilet with cues for size of step with greater amplitude.        Neurological Re-education Exercises   Other Exercises 1  Patient seen for instruction of LSVT BIG exercises: LSVT Daily Session Maximal Daily Exercises: Sustained movements are designed to rescale the amplitude of movement output for  generalization to daily functional activities. Performed as follows for 1 set of 10 repetitions each: Multi directional sustained movements- 1) Floor to ceiling, 2) Side to side. Multi directional Repetitive movements performed in standing and are designed to provide retraining effort needed for sustained muscle activation in tasks Performed as follows: 3) Step and reach forward, 4) Step and Reach Backwards, 5) Step and reach sideways, 6) Rock and reach forward/backward, 7) Rock and reach sideways.  CGA to min assist for balance when performing exercises in standing.  Patient becoming more familiar with exercises from seated position and required less cues today.     Other Exercises 2  Functional mobiity with rollator for 2 trials of 300 feet, cues for turning, amplitude of steps and occasionally for crossing thresholds.              OT Education -  09/15/18 0824    Education provided  Yes    Education Details  HEP    Person(s) Educated  Patient;Spouse    Methods  Demonstration;Explanation;Verbal cues;Tactile cues    Comprehension  Verbal cues required;Returned demonstration;Verbalized understanding;Tactile cues required          OT Long Term Goals - 09/01/18 1558      OT LONG TERM GOAL #1   Title  Patient will demonstrate increase right hand grip to be able to cut food with modified independence.     Baseline  occasional assist required.    Time  12    Period  Weeks    Status  On-going      OT LONG TERM GOAL #2   Title  Patient will complete donning long sleeve shirt with modified independence.    Baseline  requires assist with long sleeves, can perform short sleeves.    Time  12    Period  Weeks    Status  Achieved      OT LONG TERM GOAL #3   Title  Patient will improve coordination to manage buttons on clothing with occasional assistance only.    Baseline  requires assist with buttons each time    Time  12    Period  Weeks    Status  On-going      OT LONG TERM GOAL #4   Title  Patient will demonstrate upright posture with shoulders back and head up during exercises with minimal cues.      Baseline  moderate cues for positioning during exercises.     Time  12    Period  Weeks    Status  On-going      OT LONG TERM GOAL #5   Title  Patient will demonstrate turning behaviors with rollator using large steps and keeping feet behind the rollator with all turns with occasional minimal cues.     Time  12    Period  Weeks    Status  On-going      OT LONG TERM GOAL #6   Title  Patient will improve gait speed and endurance and be able to walk 950 feet in 6 minutes to negotiate around the home and community safely in 12 weeks    Baseline  860 at reassessment, 818 at recert 4/03    Time  4    Period  Weeks    Status  On-going      OT LONG TERM GOAL #7   Title  Patient will complete HEP for maximal daily exercises  with modified independence in 4 weeks    Time  12    Period  Weeks    Status  Partially Met      OT LONG TERM GOAL #8   Title  Patient will transfer from sit to stand without the use of arms safely and independently from a variety of chairs/surfaces in 4 weeks.     Baseline  difficulty from lower surfaces    Time  4    Period  Weeks    Status  Achieved      OT LONG TERM GOAL  #9   Baseline  Patient will demonstrate floor transfers with use of chair to assist with min guard and cues if needed.     Time  12    Period  Weeks    Status  New            Plan - 09/15/18 0825    Clinical Impression Statement  Patient progressing with exercises in sitting and required decreased cues this date for performing seated exercises.  Patient continues to demonstrate difficulty with turning and benefit from cues for larger amplitude of steps, when he has more space and is not on a time constraint he performs better.  Continue to work towards goals to increase independence in daily tasks and decrease fall risk with improved balance.     Occupational Profile and client history currently impacting functional performance  progressive disease process, freezing of gait behaviors, fall risk    Occupational performance deficits (Please refer to evaluation for details):  ADL's;IADL's;Leisure       Patient will benefit from skilled therapeutic intervention in order to improve the following deficits and impairments:  Abnormal gait, Decreased coordination, Decreased range of motion, Difficulty walking, Decreased endurance, Decreased safety awareness, Decreased activity tolerance, Decreased balance, Impaired UE functional use, Pain, Decreased mobility, Decreased strength  Visit Diagnosis: Muscle weakness (generalized)  Other lack of coordination  Unsteadiness on feet  Abnormal posture  Difficulty in walking, not elsewhere classified    Problem List Patient Active Problem List   Diagnosis Date Noted   . Dementia due to Parkinson's disease without behavioral disturbance ([place]) 07/18/2017  . Neurogenic orthostatic hypotension ([place]) 12/02/2016  . BPH with urinary obstruction 11/14/2016  . Urinary frequency 10/10/2016  . Microscopic hematuria 10/10/2016  . Parkinson's syndrome ([place]) 06/14/2016  . Arthritis 06/14/2016  . Depression 06/14/2016  . Heartburn 06/14/2016  . Urinary incontinence 06/14/2016  . Skin lesion 06/14/2016  . Dementia ([place]) 06/14/2016  . Bilateral edema of lower extremity 06/14/2016   Achilles Dunk, OTR/L, CLT  Lovett,Amy 09/15/2018, 8:28 AM  [place] MAIN [facility] SERVICES [address] [place], [place], [address] Phone: [phone]   Fax:  [phone]  Name: Jailan Trimm MRN: [id number] Date of Birth: September 03, 1933

## 2018-09-18 ENCOUNTER — Ambulatory Visit: Payer: Medicare Other | Admitting: Occupational Therapy

## 2018-09-24 ENCOUNTER — Ambulatory Visit: Payer: Medicare Other | Attending: Physician Assistant | Admitting: Occupational Therapy

## 2018-09-24 DIAGNOSIS — R262 Difficulty in walking, not elsewhere classified: Secondary | ICD-10-CM | POA: Insufficient documentation

## 2018-09-24 DIAGNOSIS — R278 Other lack of coordination: Secondary | ICD-10-CM | POA: Diagnosis present

## 2018-09-24 DIAGNOSIS — R2681 Unsteadiness on feet: Secondary | ICD-10-CM | POA: Insufficient documentation

## 2018-09-24 DIAGNOSIS — G8929 Other chronic pain: Secondary | ICD-10-CM | POA: Insufficient documentation

## 2018-09-24 DIAGNOSIS — R293 Abnormal posture: Secondary | ICD-10-CM | POA: Diagnosis present

## 2018-09-24 DIAGNOSIS — M6281 Muscle weakness (generalized): Secondary | ICD-10-CM

## 2018-09-24 DIAGNOSIS — M25511 Pain in right shoulder: Secondary | ICD-10-CM | POA: Insufficient documentation

## 2018-09-28 ENCOUNTER — Ambulatory Visit: Payer: Medicare Other | Admitting: Occupational Therapy

## 2018-09-28 DIAGNOSIS — R262 Difficulty in walking, not elsewhere classified: Secondary | ICD-10-CM

## 2018-09-28 DIAGNOSIS — M6281 Muscle weakness (generalized): Secondary | ICD-10-CM | POA: Diagnosis not present

## 2018-09-28 DIAGNOSIS — R278 Other lack of coordination: Secondary | ICD-10-CM

## 2018-09-28 DIAGNOSIS — R293 Abnormal posture: Secondary | ICD-10-CM

## 2018-09-28 DIAGNOSIS — R2681 Unsteadiness on feet: Secondary | ICD-10-CM

## 2018-09-29 ENCOUNTER — Encounter: Payer: Self-pay | Admitting: Occupational Therapy

## 2018-09-29 NOTE — Therapy (Signed)
[place] MAIN [facility] SERVICES [address] [place], [place], [address] Phone: [phone]   Fax:  [phone]  Occupational Therapy Treatment  Patient Details  Name: Luke Durham MRN: [id number] Date of Birth: Jan 17, 1933 No data recorded  Encounter Date: 09/28/2018  OT End of Session - 09/29/18 1510    Visit Number  154    Number of Visits  156    Date for OT Re-Evaluation  10/12/18    Authorization Type  Medicare visit 4 of 10, reporting period started 07/20/2018    OT Start Time  1100    OT Stop Time  1202    OT Time Calculation (min)  62 min    Activity Tolerance  Patient tolerated treatment well    Behavior During Therapy  [facility] for tasks assessed/performed       Past Medical History:  Diagnosis Date  . Anxiety   . Arthritis   . Asthma    childhood asthma  . Cancer ([place]) 7 years ago   lymphoma, [place] (recent)  . Chronic kidney disease   . Colon polyps   . GERD (gastroesophageal reflux disease)   . History of kidney stones   . Parkinson disease [facility])     Past Surgical History:  Procedure Laterality Date  . COLON SURGERY    . CYSTOSCOPY WITH LITHOLAPAXY N/A 11/14/2016   Procedure: CYSTOSCOPY WITH LITHOLAPAXY;  Surgeon: Hollice Espy, MD;  Location: [facility] ORS;  Service: Urology;  Laterality: N/A;  . EYE SURGERY Bilateral    Cataract Extraction with IOL  . HERNIA REPAIR  20 years  . MOHS SURGERY    . SMALL INTESTINE SURGERY     Per patient 7 years  . TRANSURETHRAL RESECTION OF PROSTATE N/A 11/14/2016   Procedure: TRANSURETHRAL RESECTION OF THE PROSTATE (TURP);  Surgeon: Hollice Espy, MD;  Location: [facility] ORS;  Service: Urology;  Laterality: N/A;    There were no vitals filed for this visit.  Subjective Assessment - 09/29/18 1508    Subjective   Pt states, "you're going to be upset with me. I fell two more times." He reports once again his walk go too far ahead and he was using U version instead of the recommended rollator.      Pertinent History  Patient reports he was diagnosed with Parkinson's disease about 10 plus years ago.  He reports a more recent decline in function in his daily activities in the last 6-12 months.  He has been seeing PT for the last couple months.    Patient Stated Goals  Patient reports he wants to be able to do more for himself and around the house.     Currently in Pain?  Yes    Pain Score  7     Pain Location  Shoulder    Pain Orientation  Right    Pain Descriptors / Indicators  Aching    Multiple Pain Sites  No                   OT Treatments/Exercises (OP) - 09/29/18 1521      Neurological Re-education Exercises   Other Exercises 1  Patient seen for instruction of LSVT BIG exercises: LSVT Daily Session Maximal Daily Exercises: Sustained movements are designed to rescale the amplitude of movement output for generalization to daily functional activities. Performed as follows for 1 set of 10 repetitions each: Multi directional sustained movements- 1) Floor to ceiling, 2) Side to side. Multi directional Repetitive movements  performed in standing and are designed to provide retraining effort needed for sustained muscle activation in tasks Performed as follows: 3) Step and reach forward, 4) Step and Reach Backwards, 5) Step and reach sideways, 6) Rock and reach forward/backward, 7) Rock and reach sideways.  Min assist for exercises in standing this date, continual cues for posture.  Sit to stand for 10 reps from mat at lowest setting, cues for shifting weight forwards.          Additional instruction to patient and his wife directly regarding recommendation for using only the rollator for the next week and all the implications stated in last session. Continued focus on posture since PT has displayed increased trunk flexion over the last Couple of sessions compared to the past. This is likely contributing to his decreased balance and recent falls. Manual stretch to pecs in sitting,  cues at low back for upright posture and stretch to hip flexors for standing erect.  Stepping patterns in variety of directions as well as crossing thresholds forwards and backwards with cues and min guard.        OT Education - 09/29/18 1509    Education provided  Yes    Education Details  falls, use of rollator, safety    Person(s) Educated  Patient;Spouse    Methods  Demonstration;Explanation;Verbal cues;Tactile cues    Comprehension  Verbal cues required;Returned demonstration;Verbalized understanding;Tactile cues required          OT Long Term Goals - 09/01/18 1558      OT LONG TERM GOAL #1   Title  Patient will demonstrate increase right hand grip to be able to cut food with modified independence.     Baseline  occasional assist required.    Time  12    Period  Weeks    Status  On-going      OT LONG TERM GOAL #2   Title  Patient will complete donning long sleeve shirt with modified independence.    Baseline  requires assist with long sleeves, can perform short sleeves.    Time  12    Period  Weeks    Status  Achieved      OT LONG TERM GOAL #3   Title  Patient will improve coordination to manage buttons on clothing with occasional assistance only.    Baseline  requires assist with buttons each time    Time  12    Period  Weeks    Status  On-going      OT LONG TERM GOAL #4   Title  Patient will demonstrate upright posture with shoulders back and head up during exercises with minimal cues.      Baseline  moderate cues for positioning during exercises.     Time  12    Period  Weeks    Status  On-going      OT LONG TERM GOAL #5   Title  Patient will demonstrate turning behaviors with rollator using large steps and keeping feet behind the rollator with all turns with occasional minimal cues.     Time  12    Period  Weeks    Status  On-going      OT LONG TERM GOAL #6   Title  Patient will improve gait speed and endurance and be able to walk 950 feet in 6  minutes to negotiate around the home and community safely in 12 weeks    Baseline  860 at reassessment, 329 at recert 5/18  Time  4    Period  Weeks    Status  On-going      OT LONG TERM GOAL #7   Title  Patient will complete HEP for maximal daily exercises with modified independence in 4 weeks    Time  12    Period  Weeks    Status  Partially Met      OT LONG TERM GOAL #8   Title  Patient will transfer from sit to stand without the use of arms safely and independently from a variety of chairs/surfaces in 4 weeks.     Baseline  difficulty from lower surfaces    Time  4    Period  Weeks    Status  Achieved      OT LONG TERM GOAL  #9   Baseline  Patient will demonstrate floor transfers with use of chair to assist with min guard and cues if needed.     Time  12    Period  Weeks    Status  New            Plan - 09/29/18 1510    Clinical Impression Statement  Additional instruction to patient and his wife directly regarding recommendation for using only the rollator for the next week and all the implications stated in last session.  Continued focus on posture since PT has displayed increased trunk flexion over the last Couple of sessions compared to the past. This is likely contributing to his decreased balance and recent falls.BP 118/73 this date.  Patient to use rollator over the weekend at home and will give feedback next week to therapist on how the transition went.  Continue to work towards goals in [place].       Occupational Profile and client history currently impacting functional performance  progressive disease process, freezing of gait behaviors, fall risk    Occupational performance deficits (Please refer to evaluation for details):  ADL's;IADL's;Leisure    Rehab Potential  Good    Current Impairments/barriers affecting progress:  positive: family support, negative: level of motivation, progressive disease    OT Frequency  2x / week    OT Duration  12 weeks    OT  Treatment/Interventions  Self-care/ADL training;Moist Heat;DME and/or AE instruction;Balance training;Therapeutic activities;Therapeutic exercise;Neuromuscular education;Functional Mobility Training;Patient/family education    Consulted and Agree with Plan of Care  Patient    Family Member Consulted  wife, Gordy Levan       Patient will benefit from skilled therapeutic intervention in order to improve the following deficits and impairments:  Abnormal gait, Decreased coordination, Decreased range of motion, Difficulty walking, Decreased endurance, Decreased safety awareness, Decreased activity tolerance, Decreased balance, Impaired UE functional use, Pain, Decreased mobility, Decreased strength  Visit Diagnosis: Muscle weakness (generalized)  Other lack of coordination  Unsteadiness on feet  Abnormal posture  Difficulty in walking, not elsewhere classified    Problem List Patient Active Problem List   Diagnosis Date Noted  . Dementia due to Parkinson's disease without behavioral disturbance ([place]) 07/18/2017  . Neurogenic orthostatic hypotension ([place]) 12/02/2016  . BPH with urinary obstruction 11/14/2016  . Urinary frequency 10/10/2016  . Microscopic hematuria 10/10/2016  . Parkinson's syndrome ([place]) 06/14/2016  . Arthritis 06/14/2016  . Depression 06/14/2016  . Heartburn 06/14/2016  . Urinary incontinence 06/14/2016  . Skin lesion 06/14/2016  . Dementia ([place]) 06/14/2016  . Bilateral edema of lower extremity 06/14/2016    Lovett,Amy 09/29/2018, 3:27 PM  [place] [facility]  SERVICES [place], [place], [address] Phone: [phone]   Fax:  [phone]  Name: Everett Ehrler MRN: [id number] Date of Birth: 30-Aug-1933

## 2018-09-29 NOTE — Therapy (Signed)
[place] MAIN [facility] SERVICES [address] [place], [place], [address] Phone: [phone]   Fax:  [phone]  Occupational Therapy Treatment  Patient Details  Name: Luke Durham MRN: [id number] Date of Birth: Dec 18, 1932 No data recorded  Encounter Date: 09/24/2018  OT End of Session - 09/29/18 1501    Visit Number  [id number]    Number of Visits  156    Date for OT Re-Evaluation  10/12/18    Authorization Type  Medicare visit 3 of 10, reporting period started 07/20/2018    OT Start Time  1301    OT Stop Time  1400    OT Time Calculation (min)  59 min    Activity Tolerance  Patient tolerated treatment well    Behavior During Therapy  [facility] for tasks assessed/performed       Past Medical History:  Diagnosis Date  . Anxiety   . Arthritis   . Asthma    childhood asthma  . Cancer ([place]) 7 years ago   lymphoma, [place] (recent)  . Chronic kidney disease   . Colon polyps   . GERD (gastroesophageal reflux disease)   . History of kidney stones   . Parkinson disease [facility])     Past Surgical History:  Procedure Laterality Date  . COLON SURGERY    . CYSTOSCOPY WITH LITHOLAPAXY N/A 11/14/2016   Procedure: CYSTOSCOPY WITH LITHOLAPAXY;  Surgeon: Hollice Espy, MD;  Location: [facility] ORS;  Service: Urology;  Laterality: N/A;  . EYE SURGERY Bilateral    Cataract Extraction with IOL  . HERNIA REPAIR  20 years  . MOHS SURGERY    . SMALL INTESTINE SURGERY     Per patient 7 years  . TRANSURETHRAL RESECTION OF PROSTATE N/A 11/14/2016   Procedure: TRANSURETHRAL RESECTION OF THE PROSTATE (TURP);  Surgeon: Hollice Espy, MD;  Location: [facility] ORS;  Service: Urology;  Laterality: N/A;    There were no vitals filed for this visit.  Subjective Assessment - 09/29/18 1500    Subjective   Patient reports he had one fall over the Thanksgiving holiday. He reports he was walking with his walker and it got too far ahead of him and he fell hitting his right shoulder and  side. He denies any serious injury but complained of increased pain in right arm this date. 8/10    Pertinent History  Patient reports he was diagnosed with Parkinson's disease about 10 plus years ago.  He reports a more recent decline in function in his daily activities in the last 6-12 months.  He has been seeing PT for the last couple months.    Patient Stated Goals  Patient reports he wants to be able to do more for himself and around the house.     Currently in Pain?  Yes    Pain Score  8     Pain Location  Shoulder    Pain Orientation  Right    Pain Descriptors / Indicators  Aching    Pain Onset  In the past 7 days    Pain Frequency  Intermittent    Multiple Pain Sites  No                   OT Treatments/Exercises (OP) - 09/29/18 1505      Neurological Re-education Exercises   Other Exercises 1  Patient seen for instruction of LSVT BIG exercises: LSVT Daily Session Maximal Daily Exercises: Sustained movements are designed to rescale the amplitude of movement  output for generalization to daily functional activities. Performed as follows for 1 set of 10 repetitions each: Multi directional sustained movements- 1) Floor to ceiling, 2) Side to side. Multi directional Repetitive movements performed in standing and are designed to provide retraining effort needed for sustained muscle activation in tasks Performed as follows: 3) Step and reach forward, 4) Step and Reach Backwards, 5) Step and reach sideways, 6) Rock and reach forward/backward, 7) Rock and reach sideways.  Min assist for exercises in standing this date, continual cues for posture.         Moist heat to right shoulder for 5 mins prior to therapy session.   Pt seen for focus on managing rollator, positioning of self in relation to walker, actions to take if feet are "glued" to the floor (pull walker back to him rather than try to step).  Discussed his use of the U version walker at home and the rollator when he is out in  public and the difference in braking systems. U version walker is too heavy for his wife to put in the car so she uses the rollator. Recommend patient use the rollator at home and when our to have a consistent assistive device that works the same.         OT Education - 09/29/18 1500    Education provided  Yes    Education Details  falls, use of rollator, safety    Person(s) Educated  Patient;Spouse    Methods  Demonstration;Explanation;Verbal cues;Tactile cues    Comprehension  Verbal cues required;Returned demonstration;Verbalized understanding;Tactile cues required          OT Long Term Goals - 09/01/18 1558      OT LONG TERM GOAL #1   Title  Patient will demonstrate increase right hand grip to be able to cut food with modified independence.     Baseline  occasional assist required.    Time  12    Period  Weeks    Status  On-going      OT LONG TERM GOAL #2   Title  Patient will complete donning long sleeve shirt with modified independence.    Baseline  requires assist with long sleeves, can perform short sleeves.    Time  12    Period  Weeks    Status  Achieved      OT LONG TERM GOAL #3   Title  Patient will improve coordination to manage buttons on clothing with occasional assistance only.    Baseline  requires assist with buttons each time    Time  12    Period  Weeks    Status  On-going      OT LONG TERM GOAL #4   Title  Patient will demonstrate upright posture with shoulders back and head up during exercises with minimal cues.      Baseline  moderate cues for positioning during exercises.     Time  12    Period  Weeks    Status  On-going      OT LONG TERM GOAL #5   Title  Patient will demonstrate turning behaviors with rollator using large steps and keeping feet behind the rollator with all turns with occasional minimal cues.     Time  12    Period  Weeks    Status  On-going      OT LONG TERM GOAL #6   Title  Patient will improve gait speed and  endurance and be able to  walk 950 feet in 6 minutes to negotiate around the home and community safely in 12 weeks    Baseline  860 at reassessment, 161 at recert 0/96    Time  4    Period  Weeks    Status  On-going      OT LONG TERM GOAL #7   Title  Patient will complete HEP for maximal daily exercises with modified independence in 4 weeks    Time  12    Period  Weeks    Status  Partially Met      OT LONG TERM GOAL #8   Title  Patient will transfer from sit to stand without the use of arms safely and independently from a variety of chairs/surfaces in 4 weeks.     Baseline  difficulty from lower surfaces    Time  4    Period  Weeks    Status  Achieved      OT LONG TERM GOAL  #9   Baseline  Patient will demonstrate floor transfers with use of chair to assist with min guard and cues if needed.     Time  12    Period  Weeks    Status  New            Plan - 09/29/18 1502    Clinical Impression Statement  Pt with recent fall and when analyzed it appears it is likely due to using two different assistive devices for mobility. Each device has an opposite braking system which confuses patient and places him at risk for falls. Recommend he use the rollator at all times for the next week since this is the device his wife can manage in and out of the car. After that if he is consistent he may want to consider a second rollator, one for the car and one at home since his wife has back pain. Patient demonstrated increased trunk flexion with mobility and requires increased cues and hands on to correct posture during session. Discussed this also contributes to fall risk since it displaces his center of gravity. Continue to work towards reducing fall risk, improving balance and independence.     Occupational Profile and client history currently impacting functional performance  progressive disease process, freezing of gait behaviors, fall risk    Occupational performance deficits (Please refer to  evaluation for details):  ADL's;IADL's;Leisure    Rehab Potential  Good    Current Impairments/barriers affecting progress:  positive: family support, negative: level of motivation, progressive disease    OT Frequency  2x / week    OT Duration  12 weeks    OT Treatment/Interventions  Self-care/ADL training;Moist Heat;DME and/or AE instruction;Balance training;Therapeutic activities;Therapeutic exercise;Neuromuscular education;Functional Mobility Training;Patient/family education    Consulted and Agree with Plan of Care  Patient    Family Member Consulted  wife, Gordy Levan       Patient will benefit from skilled therapeutic intervention in order to improve the following deficits and impairments:  Abnormal gait, Decreased coordination, Decreased range of motion, Difficulty walking, Decreased endurance, Decreased safety awareness, Decreased activity tolerance, Decreased balance, Impaired UE functional use, Pain, Decreased mobility, Decreased strength  Visit Diagnosis: Muscle weakness (generalized)  Other lack of coordination  Unsteadiness on feet  Abnormal posture  Difficulty in walking, not elsewhere classified    Problem List Patient Active Problem List   Diagnosis Date Noted  . Dementia due to Parkinson's disease without behavioral disturbance ([place]) 07/18/2017  . Neurogenic orthostatic hypotension ([place]) 12/02/2016  .  BPH with urinary obstruction 11/14/2016  . Urinary frequency 10/10/2016  . Microscopic hematuria 10/10/2016  . Parkinson's syndrome ([place]) 06/14/2016  . Arthritis 06/14/2016  . Depression 06/14/2016  . Heartburn 06/14/2016  . Urinary incontinence 06/14/2016  . Skin lesion 06/14/2016  . Dementia ([place]) 06/14/2016  . Bilateral edema of lower extremity 06/14/2016   Achilles Dunk, OTR/L, CLT  Lovett,Amy 09/29/2018, 3:06 PM  [place] MAIN [facility] SERVICES [address] [place], [place], [address] Phone: [phone]   Fax:   [phone]  Name: Odas Ozer MRN: [id number] Date of Birth: Oct 20, 1933

## 2018-10-01 NOTE — Progress Notes (Signed)
10/02/2018  3:22 PM   Luke Durham 1933/03/03 [id number]  Referring provider: Mar Daring, PA-C [address] [place], [place] [address]  Chief Complaint  Patient presents with  . Follow-up    HPI: Patient is an 84 year old Caucasian male presenting today, with his wife, for his 3 month UA, IPSS and PVR following his cysto on 07/03/2018. He has a history of Parkinson's disease, bladder outlet obstruction, bladder stones s/p TURP, cystolitholapaxy (10/2016 w/Dr. Erlene Quan).   Today, he reports following his procedure he has not experienced gross hematuria. Denies dysuria. He is currently taking finasteride, which seems to be alleviating his symptoms. He is currently satisfied with his quality of life due to urinary symptoms. Although, he occasionally experiences leakage with loss of control at night (chaning his underwear once or twice a night). He is not experiencing snoring or stopped breath while sleeping.  His IPSS as below. His PVR today is 0 mL.   He admits his overall symptoms are improving.  IPSS    Row Name 10/02/18 1200         International Prostate Symptom Score   How often have you had the sensation of not emptying your bladder?  Less than 1 in 5     How often have you had to urinate less than every two hours?  About half the time     How often have you found you stopped and started again several times when you urinated?  About half the time     How often have you found it difficult to postpone urination?  More than half the time     How often have you had a weak urinary stream?  More than half the time     How often have you had to strain to start urination?  Not at All     How many times did you typically get up at night to urinate?  1 Time     Total IPSS Score  16       Quality of Life due to urinary symptoms   If you were to spend the rest of your life with your urinary condition just the way it is now how would you feel about that?  Mostly  Satisfied        Score:  1-7 Mild 8-19 Moderate 20-35 Severe  Background history CTU in 10/2016 noted three large stones in the urinary bladder measuring up to 1.7 x 2.4 cm. In addition, there is a 2 mm nonobstructive calculus in the upper pole collecting system of the left kidney. No ureteral stones or findings of urinary tract obstruction are noted at this time.  Prostatomegaly with median lobe hypertrophy.  Aortic atherosclerosis, in addition to left main and 3 vessel coronary artery disease.  Emphysema.    KUB on 05/10/2018 revealed no definite urinary tract calcification.  Increased stool in colon.   Cysto on 07/03/2018 with Dr. Erlene Quan revealed gross hematuria secondary to friable prostatic mucosa, noted bleeding with manipulation. Recommendation of finasteride.    PMH: Past Medical History:  Diagnosis Date  . Anxiety   . Arthritis   . Asthma    childhood asthma  . Cancer ([place]) 7 years ago   lymphoma, [place] (recent)  . Chronic kidney disease   . Colon polyps   . GERD (gastroesophageal reflux disease)   . History of kidney stones   . Parkinson disease [facility])    Surgical History: Past Surgical History:  Procedure Laterality Date  .  COLON SURGERY    . CYSTOSCOPY WITH LITHOLAPAXY N/A 11/14/2016   Procedure: CYSTOSCOPY WITH LITHOLAPAXY;  Surgeon: Hollice Espy, MD;  Location: [facility] ORS;  Service: Urology;  Laterality: N/A;  . EYE SURGERY Bilateral    Cataract Extraction with IOL  . HERNIA REPAIR  20 years  . MOHS SURGERY    . SMALL INTESTINE SURGERY     Per patient 7 years  . TRANSURETHRAL RESECTION OF PROSTATE N/A 11/14/2016   Procedure: TRANSURETHRAL RESECTION OF THE PROSTATE (TURP);  Surgeon: Hollice Espy, MD;  Location: [facility] ORS;  Service: Urology;  Laterality: N/A;   Home Medications:  Allergies as of 10/02/2018   No Known Allergies     Medication List        Accurate as of 10/02/18  3:22 PM. Always use your most recent med list.          Amantadine HCl  100 MG tablet Take 100 mg by mouth daily.   aspirin 81 MG tablet Take 81 mg by mouth daily.   carbidopa-levodopa 25-100 MG tablet Commonly known as:  SINEMET IR take 2 tablets by mouth four times a day as directed   carbidopa-levodopa 50-200 MG tablet Commonly known as:  SINEMET CR Take 1 tablet by mouth at bedtime. With the Aricept   cholecalciferol 25 MCG (1000 UT) tablet Commonly known as:  VITAMIN D Take 1,000 Units by mouth daily.   docusate sodium 100 MG capsule Commonly known as:  COLACE Take 1 capsule (100 mg total) by mouth 2 (two) times daily.   donepezil 10 MG tablet Commonly known as:  ARICEPT Take 10 mg by mouth at bedtime.   econazole nitrate 1 % cream Apply 1 application topically daily. Applied to feet   finasteride 5 MG tablet Commonly known as:  PROSCAR Take 1 tablet (5 mg total) by mouth daily.   furosemide 20 MG tablet Commonly known as:  LASIX take 1 tablet by mouth once daily   multivitamin capsule Take 1 capsule by mouth daily.   niacinamide 500 MG tablet   nitrofurantoin (macrocrystal-monohydrate) 100 MG capsule Commonly known as:  MACROBID Take 1 capsule (100 mg total) by mouth every 12 (twelve) hours.   NUPLAZID 17 MG Tabs Generic drug:  Pimavanserin Tartrate Take 17 mg by mouth daily.   NUPLAZID 34 MG Caps Generic drug:  Pimavanserin Tartrate Take 34 mg by mouth daily.   OCUVITE PO Take 1 tablet by mouth daily.   sertraline 50 MG tablet Commonly known as:  ZOLOFT TAKE 1 TABLET BY MOUTH EVERY DAY   THERATEARS OP Place 1-2 drops into both eyes at bedtime.      Allergies: No Known Allergies  Family History: Family History  Problem Relation Age of Onset  . Heart disease Father   . Bladder Cancer Neg Hx   . Prostate cancer Neg Hx   . Kidney cancer Neg Hx     Social History:  reports that he has never smoked. He has never used smokeless tobacco. He reports that he drinks alcohol. He reports that he does not use  drugs.  ROS: UROLOGY Frequent Urination?: Yes Hard to postpone urination?: No Burning/pain with urination?: No Get up at night to urinate?: Yes Leakage of urine?: Yes Urine stream starts and stops?: No Trouble starting stream?: No Do you have to strain to urinate?: No Blood in urine?: No Urinary tract infection?: No Sexually transmitted disease?: No Injury to kidneys or bladder?: No Painful intercourse?: No Weak stream?: No Erection problems?:  No Penile pain?: No  Gastrointestinal Nausea?: No Vomiting?: No Indigestion/heartburn?: No Diarrhea?: No Constipation?: Yes  Constitutional Fever: No Night sweats?: No Weight loss?: No Fatigue?: No  Skin Skin rash/lesions?: No Itching?: No  Eyes Blurred vision?: No Double vision?: No  Ears/Nose/Throat Sore throat?: No Sinus problems?: No  Hematologic/Lymphatic Swollen glands?: No Easy bruising?: Yes  Cardiovascular Leg swelling?: No Chest pain?: No  Respiratory Cough?: No Shortness of breath?: No  Endocrine Excessive thirst?: No  Musculoskeletal Back pain?: No Joint pain?: Yes  Neurological Headaches?: No Dizziness?: No  Psychologic Depression?: Yes Anxiety?: Yes  Physical Exam: BP (!) 86/49   Pulse 90   Ht 5\' 7"  (1.702 m)   Wt 163 lb (73.9 kg)   BMI 25.53 kg/m   Constitutional: Well nourished. Alert and oriented, No acute distress. Respiratory: Normal respiratory effort, no increased work of breathing. Head: Normocephalic and Atraumatic Skin: No rashes, bruises or suspicious lesions. Neurologic: Grossly intact, no focal deficits, moving all 4 extremities. Psychiatric: Normal mood and affect.  Urinalysis UA today was negative. See EPIC  I have reviewed the labs.  Pertinent Imaging Results for DALLIS, CZAJA (MRN [id number]) as of 10/01/2018 10:26  Ref. Range 12/13/2016 14:49 04/12/2017 10:29 05/10/2018 10:30 10/02/2018   PVR Unknown 41ml 59ml 33ml 66ml   I have independently reviewed  the films  Assessment & Plan:    1. Benign Prostate Hyperplasia with Urinary Obstruction  - Status Post TURP (10/2016)  - PVR Today was 60ml, it is improving on finasteride.  - Patient is pleased with his urinary symptoms overall  - RTC in 1 year for follow up  2. Nocturia  - Patient reports experiencing increased nocturia and reports his son has sleep apnea managed by a CPAP machine.  - Recommended the patient pursue a sleep study as his symptoms seem to be consistent with sleep apnea. Patient denies going forward with this at this time.  3. Gross hematuria  - Cysto on 07/03/2018 revealed friable prostatic mucosa with noted bleeding with manipulation  - Patient is no longer experiencing hematuria on finasteride. Continue finasteride.  Laneta Simmers   [facility] [place], [place],  [address] [phone]  I, Temidayo Atanda-Ogunleye , am acting as a scribe for Nori Riis, PA-C  I have reviewed the above documentation for accuracy and completeness, and I agree with the above.    Zara Council, PA-C

## 2018-10-02 ENCOUNTER — Ambulatory Visit: Payer: Medicare Other | Admitting: Urology

## 2018-10-02 ENCOUNTER — Encounter: Payer: Self-pay | Admitting: Urology

## 2018-10-02 VITALS — BP 86/49 | HR 90 | Ht 67.0 in | Wt 163.0 lb

## 2018-10-02 DIAGNOSIS — N390 Urinary tract infection, site not specified: Secondary | ICD-10-CM | POA: Diagnosis not present

## 2018-10-02 DIAGNOSIS — N401 Enlarged prostate with lower urinary tract symptoms: Secondary | ICD-10-CM

## 2018-10-02 DIAGNOSIS — N138 Other obstructive and reflux uropathy: Secondary | ICD-10-CM | POA: Diagnosis not present

## 2018-10-02 DIAGNOSIS — Z87448 Personal history of other diseases of urinary system: Secondary | ICD-10-CM | POA: Diagnosis not present

## 2018-10-02 LAB — MICROSCOPIC EXAMINATION: RBC, UA: NONE SEEN /hpf (ref 0–2)

## 2018-10-02 LAB — URINALYSIS, COMPLETE
Bilirubin, UA: NEGATIVE
Glucose, UA: NEGATIVE
Leukocytes, UA: NEGATIVE
Nitrite, UA: NEGATIVE
RBC, UA: NEGATIVE
Specific Gravity, UA: >1.03 — ABNORMAL HIGH (ref 1.005–1.030)
Urobilinogen, Ur: 0.2 mg/dL (ref 0.2–1.0)
pH, UA: 5.5 (ref 5.0–7.5)

## 2018-10-02 LAB — BLADDER SCAN AMB NON-IMAGING: Scan Result: 0

## 2018-10-03 ENCOUNTER — Ambulatory Visit: Payer: Medicare Other | Admitting: Occupational Therapy

## 2018-10-03 DIAGNOSIS — R278 Other lack of coordination: Secondary | ICD-10-CM

## 2018-10-03 DIAGNOSIS — M6281 Muscle weakness (generalized): Secondary | ICD-10-CM | POA: Diagnosis not present

## 2018-10-03 DIAGNOSIS — R262 Difficulty in walking, not elsewhere classified: Secondary | ICD-10-CM

## 2018-10-03 DIAGNOSIS — R293 Abnormal posture: Secondary | ICD-10-CM

## 2018-10-03 DIAGNOSIS — R2681 Unsteadiness on feet: Secondary | ICD-10-CM

## 2018-10-08 ENCOUNTER — Ambulatory Visit: Payer: Medicare Other | Admitting: Occupational Therapy

## 2018-10-08 DIAGNOSIS — R2681 Unsteadiness on feet: Secondary | ICD-10-CM

## 2018-10-08 DIAGNOSIS — R293 Abnormal posture: Secondary | ICD-10-CM

## 2018-10-08 DIAGNOSIS — M6281 Muscle weakness (generalized): Secondary | ICD-10-CM

## 2018-10-08 DIAGNOSIS — M25511 Pain in right shoulder: Secondary | ICD-10-CM

## 2018-10-08 DIAGNOSIS — R262 Difficulty in walking, not elsewhere classified: Secondary | ICD-10-CM

## 2018-10-08 DIAGNOSIS — G8929 Other chronic pain: Secondary | ICD-10-CM

## 2018-10-08 DIAGNOSIS — R278 Other lack of coordination: Secondary | ICD-10-CM

## 2018-10-12 ENCOUNTER — Encounter: Payer: Self-pay | Admitting: Occupational Therapy

## 2018-10-12 ENCOUNTER — Ambulatory Visit: Payer: Medicare Other | Admitting: Occupational Therapy

## 2018-10-12 NOTE — Therapy (Signed)
[place] MAIN [facility] SERVICES [address] [place], [place], [address] Phone: [phone]   Fax:  [phone]  Occupational Therapy Treatment  Patient Details  Name: Luke Durham MRN: [id number] Date of Birth: [birth year] No data recorded  Encounter Date: 10/08/2018  OT End of Session - 10/12/18 1617    Visit Number  156    Number of Visits  156    Date for OT Re-Evaluation  10/12/18    Authorization Type  Medicare visit 6 of 10, reporting period started 07/20/2018    OT Start Time  1100    OT Stop Time  1200    OT Time Calculation (min)  60 min    Activity Tolerance  Patient tolerated treatment well    Behavior During Therapy  [facility] for tasks assessed/performed       Past Medical History:  Diagnosis Date  . Anxiety   . Arthritis   . Asthma    childhood asthma  . Cancer ([place]) 7 years ago   lymphoma, [place] (recent)  . Chronic kidney disease   . Colon polyps   . GERD (gastroesophageal reflux disease)   . History of kidney stones   . Parkinson disease [facility])     Past Surgical History:  Procedure Laterality Date  . COLON SURGERY    . CYSTOSCOPY WITH LITHOLAPAXY N/A 11/14/2016   Procedure: CYSTOSCOPY WITH LITHOLAPAXY;  Surgeon: Hollice Espy, MD;  Location: [facility] ORS;  Service: Urology;  Laterality: N/A;  . EYE SURGERY Bilateral    Cataract Extraction with IOL  . HERNIA REPAIR  20 years  . MOHS SURGERY    . SMALL INTESTINE SURGERY     Per patient 7 years  . TRANSURETHRAL RESECTION OF PROSTATE N/A 11/14/2016   Procedure: TRANSURETHRAL RESECTION OF THE PROSTATE (TURP);  Surgeon: Hollice Espy, MD;  Location: [facility] ORS;  Service: Urology;  Laterality: N/A;    There were no vitals filed for this visit.  Subjective Assessment - 10/12/18 1614    Subjective   Patient denies any new falls since last week.  reports bruising now present on right UE and some groin pain. Reports he feels better this week, able to feed himself again and  moving the arm more but did not follow up with any additional medical care.     Pertinent History  Patient reports he was diagnosed with Parkinson's disease about 10 plus years ago.  He reports a more recent decline in function in his daily activities in the last 6-12 months.  He has been seeing PT for the last couple months.    Patient Stated Goals  Patient reports he wants to be able to do more for himself and around the house.     Currently in Pain?  Yes    Pain Score  7     Pain Location  Shoulder    Pain Orientation  Right    Pain Descriptors / Indicators  Aching    Pain Type  Acute pain;Chronic pain    Pain Frequency  Intermittent    Multiple Pain Sites  Yes    Pain Score  7    Pain Location  Groin    Pain Orientation  Right    Pain Descriptors / Indicators  Aching         Patient seen for LSVT BIG exercises as outlined in previous sessions but modified this date to perform in sitting and without the use of right UE Then performed standing exercises  without the use of right UE and min assist from therapist for balance, cues provided for technique with  Exercises modified.    Posture exercises in standing to promote upright posture, verbal cues and manual techniques utilized.    Balance tasks in standing with multi directional stepping patterns.   Focus on calibration of movements with use of rollator and positioning of self in relation to AD when ambulating.  Min cues for position of rollator at times.                  OT Education - 10/12/18 1616    Education provided  Yes    Education Details  falls, use of rollator, safety and recommendation to follow up with MD    Person(s) Educated  Patient;Spouse    Methods  Demonstration;Explanation;Verbal cues;Tactile cues    Comprehension  Verbal cues required;Returned demonstration;Verbalized understanding;Tactile cues required          OT Long Term Goals - 09/01/18 1558      OT LONG TERM GOAL #1   Title   Patient will demonstrate increase right hand grip to be able to cut food with modified independence.     Baseline  occasional assist required.    Time  12    Period  Weeks    Status  On-going      OT LONG TERM GOAL #2   Title  Patient will complete donning long sleeve shirt with modified independence.    Baseline  requires assist with long sleeves, can perform short sleeves.    Time  12    Period  Weeks    Status  Achieved      OT LONG TERM GOAL #3   Title  Patient will improve coordination to manage buttons on clothing with occasional assistance only.    Baseline  requires assist with buttons each time    Time  12    Period  Weeks    Status  On-going      OT LONG TERM GOAL #4   Title  Patient will demonstrate upright posture with shoulders back and head up during exercises with minimal cues.      Baseline  moderate cues for positioning during exercises.     Time  12    Period  Weeks    Status  On-going      OT LONG TERM GOAL #5   Title  Patient will demonstrate turning behaviors with rollator using large steps and keeping feet behind the rollator with all turns with occasional minimal cues.     Time  12    Period  Weeks    Status  On-going      OT LONG TERM GOAL #6   Title  Patient will improve gait speed and endurance and be able to walk 950 feet in 6 minutes to negotiate around the home and community safely in 12 weeks    Baseline  860 at reassessment, 837 at recert 2/90    Time  4    Period  Weeks    Status  On-going      OT LONG TERM GOAL #7   Title  Patient will complete HEP for maximal daily exercises with modified independence in 4 weeks    Time  12    Period  Weeks    Status  Partially Met      OT LONG TERM GOAL #8   Title  Patient will transfer from sit to stand without the  use of arms safely and independently from a variety of chairs/surfaces in 4 weeks.     Baseline  difficulty from lower surfaces    Time  4    Period  Weeks    Status  Achieved      OT  LONG TERM GOAL  #9   Baseline  Patient will demonstrate floor transfers with use of chair to assist with min guard and cues if needed.     Time  12    Period  Weeks    Status  New            Plan - 10/12/18 1617    Clinical Impression Statement  Patient now with significant bruising of right UE from fall last week.  He reports improvement with motion and decreased pain but also reports pain in right side groin with stepping patterns encouraging ABD.  Patient able to participate in therapy this date and reports feeling better after session.  We limited the use of right UE during session today given his pain level.  Continue to recommend patient follow up with MD regarding recent fall and injuries.  Will plan for reassessment next session of goals and POC>     Occupational Profile and client history currently impacting functional performance  progressive disease process, freezing of gait behaviors, fall risk    Rehab Potential  Good    Current Impairments/barriers affecting progress:  positive: family support, negative: level of motivation, progressive disease    OT Frequency  2x / week    OT Duration  12 weeks    OT Treatment/Interventions  Self-care/ADL training;Moist Heat;DME and/or AE instruction;Balance training;Therapeutic activities;Therapeutic exercise;Neuromuscular education;Functional Mobility Training;Patient/family education    Consulted and Agree with Plan of Care  Patient    Family Member Consulted  wife, Gordy Levan       Patient will benefit from skilled therapeutic intervention in order to improve the following deficits and impairments:  Abnormal gait, Decreased coordination, Decreased range of motion, Difficulty walking, Decreased endurance, Decreased safety awareness, Decreased activity tolerance, Decreased balance, Impaired UE functional use, Pain, Decreased mobility, Decreased strength  Visit Diagnosis: Muscle weakness (generalized)  Other lack of  coordination  Unsteadiness on feet  Abnormal posture  Difficulty in walking, not elsewhere classified  Chronic right shoulder pain    Problem List Patient Active Problem List   Diagnosis Date Noted  . Dementia due to Parkinson's disease without behavioral disturbance ([place]) 07/18/2017  . Neurogenic orthostatic hypotension ([place]) 12/02/2016  . BPH with urinary obstruction 11/14/2016  . Urinary frequency 10/10/2016  . Microscopic hematuria 10/10/2016  . Parkinson's syndrome ([place]) 06/14/2016  . Arthritis 06/14/2016  . Depression 06/14/2016  . Heartburn 06/14/2016  . Urinary incontinence 06/14/2016  . Skin lesion 06/14/2016  . Dementia ([place]) 06/14/2016  . Bilateral edema of lower extremity 06/14/2016   Achilles Dunk, OTR/L, CLT  , 10/12/2018, 4:22 PM  [place] MAIN [facility] SERVICES [address] [place], [place], [address] Phone: [phone]   Fax:  [phone]  Name: Luke Durham MRN: [id number] Date of Birth: 1933-02-14

## 2018-10-12 NOTE — Therapy (Signed)
[place] MAIN [facility] SERVICES [address] [place], [place], [address] Phone: [phone]   Fax:  [phone]  Occupational Therapy Treatment  Patient Details  Name: Luke Durham MRN: [id number] Date of Birth: 10-Feb-1933 No data recorded  Encounter Date: 10/03/2018  OT End of Session - 10/12/18 1528    Visit Number  155    Number of Visits  156    Date for OT Re-Evaluation  10/12/18    Authorization Type  Medicare visit 5 of 10, reporting period started 07/20/2018    OT Start Time  1100    OT Stop Time  1158    OT Time Calculation (min)  58 min    Activity Tolerance  Patient tolerated treatment well    Behavior During Therapy  [facility] for tasks assessed/performed       Past Medical History:  Diagnosis Date  . Anxiety   . Arthritis   . Asthma    childhood asthma  . Cancer ([place]) 7 years ago   lymphoma, [place] (recent)  . Chronic kidney disease   . Colon polyps   . GERD (gastroesophageal reflux disease)   . History of kidney stones   . Parkinson disease [facility])     Past Surgical History:  Procedure Laterality Date  . COLON SURGERY    . CYSTOSCOPY WITH LITHOLAPAXY N/A 11/14/2016   Procedure: CYSTOSCOPY WITH LITHOLAPAXY;  Surgeon: Hollice Espy, MD;  Location: [facility] ORS;  Service: Urology;  Laterality: N/A;  . EYE SURGERY Bilateral    Cataract Extraction with IOL  . HERNIA REPAIR  20 years  . MOHS SURGERY    . SMALL INTESTINE SURGERY     Per patient 7 years  . TRANSURETHRAL RESECTION OF PROSTATE N/A 11/14/2016   Procedure: TRANSURETHRAL RESECTION OF THE PROSTATE (TURP);  Surgeon: Hollice Espy, MD;  Location: [facility] ORS;  Service: Urology;  Laterality: N/A;    There were no vitals filed for this visit.  Subjective Assessment - 10/12/18 1525    Subjective   Patient reports another fall at home, states he was just walking and again the walker got too far from him and he fell, reports increased pain in his right shoulder 8/10 and is  having difficulty with moving the arm and being able to feed self.      Pertinent History  Patient reports he was diagnosed with Parkinson's disease about 10 plus years ago.  He reports a more recent decline in function in his daily activities in the last 6-12 months.  He has been seeing PT for the last couple months.    Patient Stated Goals  Patient reports he wants to be able to do more for himself and around the house.     Currently in Pain?  Yes    Pain Score  8     Pain Location  Shoulder    Pain Orientation  Right    Pain Descriptors / Indicators  Aching;Tender    Pain Type  Chronic pain;Acute pain    Pain Onset  In the past 7 days    Pain Frequency  Intermittent    Multiple Pain Sites  No         Moist heat to RUE shoulder and arm for 10 minutes prior to treatment session.   Fall analysis, no visible bruising this date, difficulty with hand to mouth patterns and overhead reach. Able to tolerate PROM to RUE with no increased pain.  Patient seen for therex in sitting  with LEs for ankle pumps, circles in both directions, knee extension,  Leg crossovers to reach feet for managing socks.  Stepping patterns in sitting for stepping forwards,  To each side, right and left, backwards for 2 sets of 10 repetitions each.    Balance tasks in standing with min assist from therapist and patient performing above stepping patterns In standing position.  Cues for technique, right arm not incorporated into task due to increased pain  Discussed with patient and wife the recommendation to follow up with MD regarding increased  Frequency of falls and with injury to right UE with last fall.  They demonstrate understanding.                   OT Education - 10/12/18 1527    Education provided  Yes    Education Details  falls, use of rollator, safety and recommendation to follow up with MD    Person(s) Educated  Patient;Spouse    Methods  Demonstration;Explanation;Verbal cues;Tactile  cues    Comprehension  Verbal cues required;Returned demonstration;Verbalized understanding;Tactile cues required          OT Long Term Goals - 09/01/18 1558      OT LONG TERM GOAL #1   Title  Patient will demonstrate increase right hand grip to be able to cut food with modified independence.     Baseline  occasional assist required.    Time  12    Period  Weeks    Status  On-going      OT LONG TERM GOAL #2   Title  Patient will complete donning long sleeve shirt with modified independence.    Baseline  requires assist with long sleeves, can perform short sleeves.    Time  12    Period  Weeks    Status  Achieved      OT LONG TERM GOAL #3   Title  Patient will improve coordination to manage buttons on clothing with occasional assistance only.    Baseline  requires assist with buttons each time    Time  12    Period  Weeks    Status  On-going      OT LONG TERM GOAL #4   Title  Patient will demonstrate upright posture with shoulders back and head up during exercises with minimal cues.      Baseline  moderate cues for positioning during exercises.     Time  12    Period  Weeks    Status  On-going      OT LONG TERM GOAL #5   Title  Patient will demonstrate turning behaviors with rollator using large steps and keeping feet behind the rollator with all turns with occasional minimal cues.     Time  12    Period  Weeks    Status  On-going      OT LONG TERM GOAL #6   Title  Patient will improve gait speed and endurance and be able to walk 950 feet in 6 minutes to negotiate around the home and community safely in 12 weeks    Baseline  860 at reassessment, 329 at recert 9/24    Time  4    Period  Weeks    Status  On-going      OT LONG TERM GOAL #7   Title  Patient will complete HEP for maximal daily exercises with modified independence in 4 weeks    Time  12    Period  Weeks    Status  Partially Met      OT LONG TERM GOAL #8   Title  Patient will transfer from sit to  stand without the use of arms safely and independently from a variety of chairs/surfaces in 4 weeks.     Baseline  difficulty from lower surfaces    Time  4    Period  Weeks    Status  Achieved      OT LONG TERM GOAL  #9   Baseline  Patient will demonstrate floor transfers with use of chair to assist with min guard and cues if needed.     Time  12    Period  Weeks    Status  New            Plan - 10/12/18 1529    Clinical Impression Statement  Patient reports another fall this date, has right shoulder pain 8/10 and difficulty with raising arm and being able to feed himself bringing his hand to mouth.  Patient is unsure of what he was doing other than walking and felt the rollator got too far ahead of him and he fell forwards onto his right shoulder (history of rotator cuff issues and chronic pain prior).  Skin check and patient did not have any visual brusing this date on right arm.  He was able to tolerate PROM to RUE with mild pain but increased pain when performing active movement.  Moist heat prior to session to right shoulder and arm, patient reports decreased pain at that point and wanted to participate in session.  Exercises modified to perform in sitting and without the use of arms, legs only.  Recommended patient follow up with MD regarding increased falls in recent weeks as well as for right arm given the fact that patient has difficulty now with movement and self feeding.      Occupational Profile and client history currently impacting functional performance  progressive disease process, freezing of gait behaviors, fall risk    Occupational performance deficits (Please refer to evaluation for details):  ADL's;IADL's;Leisure    Rehab Potential  Good    Current Impairments/barriers affecting progress:  positive: family support, negative: level of motivation, progressive disease    OT Frequency  2x / week    OT Duration  12 weeks    OT Treatment/Interventions  Self-care/ADL  training;Moist Heat;DME and/or AE instruction;Balance training;Therapeutic activities;Therapeutic exercise;Neuromuscular education;Functional Mobility Training;Patient/family education    Consulted and Agree with Plan of Care  Patient    Family Member Consulted  wife, Gordy Levan       Patient will benefit from skilled therapeutic intervention in order to improve the following deficits and impairments:  Abnormal gait, Decreased coordination, Decreased range of motion, Difficulty walking, Decreased endurance, Decreased safety awareness, Decreased activity tolerance, Decreased balance, Impaired UE functional use, Pain, Decreased mobility, Decreased strength  Visit Diagnosis: Muscle weakness (generalized)  Other lack of coordination  Unsteadiness on feet  Abnormal posture  Difficulty in walking, not elsewhere classified    Problem List Patient Active Problem List   Diagnosis Date Noted  . Dementia due to Parkinson's disease without behavioral disturbance ([place]) 07/18/2017  . Neurogenic orthostatic hypotension ([place]) 12/02/2016  . BPH with urinary obstruction 11/14/2016  . Urinary frequency 10/10/2016  . Microscopic hematuria 10/10/2016  . Parkinson's syndrome ([place]) 06/14/2016  . Arthritis 06/14/2016  . Depression 06/14/2016  . Heartburn 06/14/2016  . Urinary incontinence 06/14/2016  . Skin lesion 06/14/2016  . Dementia ([place]) 06/14/2016  .  Bilateral edema of lower extremity 06/14/2016   Achilles Dunk, OTR/L, CLT  Lovett,Amy 10/12/2018, 3:35 PM  [place] MAIN [facility] SERVICES [address] [place], [place], [address] Phone: [phone]   Fax:  [phone]  Name: Luke Durham MRN: [id number] Date of Birth: April 03, 1933

## 2018-10-19 ENCOUNTER — Ambulatory Visit: Payer: Medicare Other | Admitting: Occupational Therapy

## 2018-10-19 ENCOUNTER — Encounter: Payer: Self-pay | Admitting: Occupational Therapy

## 2018-10-19 DIAGNOSIS — R293 Abnormal posture: Secondary | ICD-10-CM

## 2018-10-19 DIAGNOSIS — R278 Other lack of coordination: Secondary | ICD-10-CM

## 2018-10-19 DIAGNOSIS — R2681 Unsteadiness on feet: Secondary | ICD-10-CM

## 2018-10-19 DIAGNOSIS — M25511 Pain in right shoulder: Secondary | ICD-10-CM

## 2018-10-19 DIAGNOSIS — G8929 Other chronic pain: Secondary | ICD-10-CM

## 2018-10-19 DIAGNOSIS — M6281 Muscle weakness (generalized): Secondary | ICD-10-CM | POA: Diagnosis not present

## 2018-10-19 DIAGNOSIS — R262 Difficulty in walking, not elsewhere classified: Secondary | ICD-10-CM

## 2018-10-19 NOTE — Therapy (Signed)
[place] MAIN [facility] SERVICES [address] [place], [place], [address] Phone: [phone]   Fax:  [phone]  Occupational Therapy Treatment  Patient Details  Name: Luke Durham MRN: [id number] Date of Birth: August 25, 1933 No data recorded  Encounter Date: 10/19/2018  OT End of Session - 10/19/18 1541    Visit Number  [id number]    Number of Visits  180    Date for OT Re-Evaluation  01/11/19    Authorization Type  Medicare visit 7 of 10, reporting period started 07/20/2018    OT Start Time  1105    OT Stop Time  1200    OT Time Calculation (min)  55 min    Activity Tolerance  Patient tolerated treatment well    Behavior During Therapy  [facility] for tasks assessed/performed       Past Medical History:  Diagnosis Date  . Anxiety   . Arthritis   . Asthma    childhood asthma  . Cancer ([place]) 7 years ago   lymphoma, [place] (recent)  . Chronic kidney disease   . Colon polyps   . GERD (gastroesophageal reflux disease)   . History of kidney stones   . Parkinson disease [facility])     Past Surgical History:  Procedure Laterality Date  . COLON SURGERY    . CYSTOSCOPY WITH LITHOLAPAXY N/A 11/14/2016   Procedure: CYSTOSCOPY WITH LITHOLAPAXY;  Surgeon: Hollice Espy, MD;  Location: [facility] ORS;  Service: Urology;  Laterality: N/A;  . EYE SURGERY Bilateral    Cataract Extraction with IOL  . HERNIA REPAIR  20 years  . MOHS SURGERY    . SMALL INTESTINE SURGERY     Per patient 7 years  . TRANSURETHRAL RESECTION OF PROSTATE N/A 11/14/2016   Procedure: TRANSURETHRAL RESECTION OF THE PROSTATE (TURP);  Surgeon: Hollice Espy, MD;  Location: [facility] ORS;  Service: Urology;  Laterality: N/A;    There were no vitals filed for this visit.  Subjective Assessment - 10/19/18 1539    Subjective   Patient denies any new falls in the last week, brusing on right arm has improved greatly, his range of motion is within functional limits and close to his baseline range, pain in  right shoulder, 5/10 today    Pertinent History  Patient reports he was diagnosed with Parkinson's disease about 10 plus years ago.  He reports a more recent decline in function in his daily activities in the last 6-12 months.  He has been seeing PT for the last couple months.    Patient Stated Goals  Patient reports he wants to be able to do more for himself and around the house.     Currently in Pain?  Yes    Pain Score  5     Pain Location  Arm    Pain Orientation  Right    Pain Descriptors / Indicators  Aching    Pain Onset  More than a month ago    Pain Frequency  Intermittent                           OT Education - 10/19/18 1541    Education provided  Yes    Education Details  falls, use of rollator, safety     Person(s) Educated  Patient;Spouse    Methods  Demonstration;Explanation;Verbal cues;Tactile cues    Comprehension  Verbal cues required;Returned demonstration;Verbalized understanding;Tactile cues required  OT Long Term Goals - 10/19/18 1544      OT LONG TERM GOAL #1   Title  Patient will demonstrate increase right hand grip to be able to cut food with modified independence.     Baseline  occasional assist required.    Time  12    Period  Weeks    Status  On-going      OT LONG TERM GOAL #2   Title  Patient will complete donning long sleeve shirt with modified independence.    Baseline  requires assist with long sleeves, can perform short sleeves.    Time  12    Period  Weeks    Status  Achieved      OT LONG TERM GOAL #3   Title  Patient will improve coordination to manage buttons on clothing with occasional assistance only.    Baseline  requires assist with buttons each time    Time  12    Period  Weeks    Status  On-going      OT LONG TERM GOAL #4   Title  Patient will demonstrate upright posture with shoulders back and head up during exercises with minimal cues.      Baseline  moderate cues for positioning during  exercises.     Time  12    Period  Weeks    Status  On-going      OT LONG TERM GOAL #5   Title  Patient will demonstrate turning behaviors with rollator using large steps and keeping feet behind the rollator with all turns with occasional minimal cues.     Time  12    Period  Weeks    Status  On-going      OT LONG TERM GOAL #6   Title  Patient will improve gait speed and endurance and be able to walk 950 feet in 6 minutes to negotiate around the home and community safely in 12 weeks    Baseline  860 at reassessment, 379 at recert 0/24    Time  4    Period  Weeks    Status  On-going      OT LONG TERM GOAL #7   Title  Patient will complete HEP for maximal daily exercises with modified independence in 4 weeks    Time  12    Period  Weeks    Status  Partially Met      OT LONG TERM GOAL #8   Title  Patient will transfer from sit to stand without the use of arms safely and independently from a variety of chairs/surfaces in 4 weeks.     Baseline  difficulty from lower surfaces    Time  4    Period  Weeks    Status  Achieved      OT LONG TERM GOAL  #9   Baseline  Patient will demonstrate floor transfers with use of chair to assist with min guard and cues if needed.     Time  12    Period  Weeks    Status  On-going            Plan - 10/19/18 1543    Occupational Profile and client history currently impacting functional performance  progressive disease process, freezing of gait behaviors, fall risk    Occupational performance deficits (Please refer to evaluation for details):  ADL's;IADL's;Leisure    Rehab Potential  Good    Current Impairments/barriers affecting progress:  positive: family support,  negative: level of motivation, progressive disease    OT Frequency  2x / week    OT Duration  12 weeks    OT Treatment/Interventions  Self-care/ADL training;Moist Heat;DME and/or AE instruction;Balance training;Therapeutic activities;Therapeutic exercise;Neuromuscular  education;Functional Mobility Training;Patient/family education    Consulted and Agree with Plan of Care  Patient    Family Member Consulted  wife, Gordy Levan       Patient will benefit from skilled therapeutic intervention in order to improve the following deficits and impairments:  Abnormal gait, Decreased coordination, Decreased range of motion, Difficulty walking, Decreased endurance, Decreased safety awareness, Decreased activity tolerance, Decreased balance, Impaired UE functional use, Pain, Decreased mobility, Decreased strength  Visit Diagnosis: Muscle weakness (generalized)  Other lack of coordination  Unsteadiness on feet  Abnormal posture  Difficulty in walking, not elsewhere classified  Chronic right shoulder pain    Problem List Patient Active Problem List   Diagnosis Date Noted  . Dementia due to Parkinson's disease without behavioral disturbance ([place]) 07/18/2017  . Neurogenic orthostatic hypotension ([place]) 12/02/2016  . BPH with urinary obstruction 11/14/2016  . Urinary frequency 10/10/2016  . Microscopic hematuria 10/10/2016  . Parkinson's syndrome ([place]) 06/14/2016  . Arthritis 06/14/2016  . Depression 06/14/2016  . Heartburn 06/14/2016  . Urinary incontinence 06/14/2016  . Skin lesion 06/14/2016  . Dementia ([place]) 06/14/2016  . Bilateral edema of lower extremity 06/14/2016   Achilles Dunk, OTR/L, CLT  Lovett,Amy 10/19/2018, 3:45 PM  [place] MAIN [facility] SERVICES [address] [place], [place], [address] Phone: [phone]   Fax:  [phone]  Name: Kynan Peasley MRN: [id number] Date of Birth: 1933-03-11

## 2018-10-22 ENCOUNTER — Ambulatory Visit: Payer: Medicare Other | Admitting: Occupational Therapy

## 2018-10-22 DIAGNOSIS — R278 Other lack of coordination: Secondary | ICD-10-CM

## 2018-10-22 DIAGNOSIS — M25511 Pain in right shoulder: Secondary | ICD-10-CM

## 2018-10-22 DIAGNOSIS — R262 Difficulty in walking, not elsewhere classified: Secondary | ICD-10-CM

## 2018-10-22 DIAGNOSIS — G8929 Other chronic pain: Secondary | ICD-10-CM

## 2018-10-22 DIAGNOSIS — M6281 Muscle weakness (generalized): Secondary | ICD-10-CM | POA: Diagnosis not present

## 2018-10-22 DIAGNOSIS — R2681 Unsteadiness on feet: Secondary | ICD-10-CM

## 2018-10-22 DIAGNOSIS — R293 Abnormal posture: Secondary | ICD-10-CM

## 2018-10-23 ENCOUNTER — Encounter: Payer: Self-pay | Admitting: Occupational Therapy

## 2018-10-23 NOTE — Therapy (Signed)
[place] MAIN [facility] SERVICES [address] [place], [place], [address] Phone: [phone]   Fax:  [phone]  Occupational Therapy Treatment  Patient Details  Name: Luke Durham MRN: [id number] Date of Birth: Apr 14, 1933 No data recorded  Encounter Date: 10/22/2018  OT End of Session - 10/23/18 1818    Visit Number  [id number]    Number of Visits  180    Date for OT Re-Evaluation  01/11/19    Authorization Type  Medicare visit 8 of 10, reporting period started 07/20/2018    OT Start Time  1258    OT Stop Time  1400    OT Time Calculation (min)  62 min    Activity Tolerance  Patient tolerated treatment well    Behavior During Therapy  [facility] for tasks assessed/performed       Past Medical History:  Diagnosis Date  . Anxiety   . Arthritis   . Asthma    childhood asthma  . Cancer ([place]) 7 years ago   lymphoma, [place] (recent)  . Chronic kidney disease   . Colon polyps   . GERD (gastroesophageal reflux disease)   . History of kidney stones   . Parkinson disease [facility])     Past Surgical History:  Procedure Laterality Date  . COLON SURGERY    . CYSTOSCOPY WITH LITHOLAPAXY N/A 11/14/2016   Procedure: CYSTOSCOPY WITH LITHOLAPAXY;  Surgeon: Hollice Espy, MD;  Location: [facility] ORS;  Service: Urology;  Laterality: N/A;  . EYE SURGERY Bilateral    Cataract Extraction with IOL  . HERNIA REPAIR  20 years  . MOHS SURGERY    . SMALL INTESTINE SURGERY     Per patient 7 years  . TRANSURETHRAL RESECTION OF PROSTATE N/A 11/14/2016   Procedure: TRANSURETHRAL RESECTION OF THE PROSTATE (TURP);  Surgeon: Hollice Espy, MD;  Location: [facility] ORS;  Service: Urology;  Laterality: N/A;    There were no vitals filed for this visit.  Subjective Assessment - 10/23/18 1817    Subjective   Patient states, "I had another fall, after 12 days of not falling.  I was doing what you told me NOT to do and I let the walker get too far ahead and I fell forwards onto my knees,  I didn't get hurt but I am a bit sore."      Pertinent History  Patient reports he was diagnosed with Parkinson's disease about 10 plus years ago.  He reports a more recent decline in function in his daily activities in the last 6-12 months.  He has been seeing PT for the last couple months.    Patient Stated Goals  Patient reports he wants to be able to do more for himself and around the house.     Currently in Pain?  Yes    Pain Score  4     Pain Orientation  Right    Pain Descriptors / Indicators  Aching    Pain Type  Chronic pain;Acute pain    Pain Onset  1 to 4 weeks ago    Pain Frequency  Intermittent         Patient seen for instruction of LSVT BIG exercises: LSVT Daily Session Maximal Daily Exercises:  Sustained movements are designed to rescale the amplitude of movement output for generalization  to daily functional activities. Performed as follows for 1 set of 10 repetitions each: Multi directional  sustained movements- 1) Floor to ceiling, 2) Side to side. Multi directional Repetitive movements  performed in standing and are designed to provide retraining effort needed for sustained muscle  activation in tasks Performed as follows: 3) Step and reach forward, 4) Step and Reach Backwards,  5) Step and reach sideways, 6) Rock and reach forward/backward, 7) Rock and reach sideways. Seated  exercises required occasional cues.  For standing exercises, patient required contact guard assist to perform and for balance.  Sit to stand from mat table on lowest setting with cues for weight shift, technique and minimal assist for 5 reps for 1 set.  Exercises in standing performed in a standard version with therapist assist, at home he still completes exercises in an adapted version with the use of a chair to assist with balance.   Functional mobility  Education to patient and wife regarding how to self correct with assistive device when it gets too far ahead of  him                   OT Education - 10/23/18 1818    Education provided  Yes    Education Details  fall risk, safety and use of assistive device    Person(s) Educated  Patient;Spouse    Methods  Demonstration;Explanation;Verbal cues;Tactile cues    Comprehension  Verbal cues required;Returned demonstration;Verbalized understanding;Tactile cues required          OT Long Term Goals - 10/19/18 1544      OT LONG TERM GOAL #1   Title  Patient will demonstrate increase right hand grip to be able to cut food with modified independence.     Baseline  occasional assist required.    Time  12    Period  Weeks    Status  On-going      OT LONG TERM GOAL #2   Title  Patient will complete donning long sleeve shirt with modified independence.    Baseline  requires assist with long sleeves, can perform short sleeves.    Time  12    Period  Weeks    Status  Achieved      OT LONG TERM GOAL #3   Title  Patient will improve coordination to manage buttons on clothing with occasional assistance only.    Baseline  requires assist with buttons each time    Time  12    Period  Weeks    Status  On-going      OT LONG TERM GOAL #4   Title  Patient will demonstrate upright posture with shoulders back and head up during exercises with minimal cues.      Baseline  moderate cues for positioning during exercises.     Time  12    Period  Weeks    Status  On-going      OT LONG TERM GOAL #5   Title  Patient will demonstrate turning behaviors with rollator using large steps and keeping feet behind the rollator with all turns with occasional minimal cues.     Time  12    Period  Weeks    Status  On-going      OT LONG TERM GOAL #6   Title  Patient will improve gait speed and endurance and be able to walk 950 feet in 6 minutes to negotiate around the home and community safely in 12 weeks    Baseline  860 at reassessment, 235 at recert 3/61    Time  4    Period  Weeks    Status   On-going  OT LONG TERM GOAL #7   Title  Patient will complete HEP for maximal daily exercises with modified independence in 4 weeks    Time  12    Period  Weeks    Status  Partially Met      OT LONG TERM GOAL #8   Title  Patient will transfer from sit to stand without the use of arms safely and independently from a variety of chairs/surfaces in 4 weeks.     Baseline  difficulty from lower surfaces    Time  4    Period  Weeks    Status  Achieved      OT LONG TERM GOAL  #9   Baseline  Patient will demonstrate floor transfers with use of chair to assist with min guard and cues if needed.     Time  12    Period  Weeks    Status  On-going            Plan - 10/23/18 1819    Clinical Impression Statement  Patient with another fall over the weekend without injury.    Occupational Profile and client history currently impacting functional performance  progressive disease process, freezing of gait behaviors, fall risk    Occupational performance deficits (Please refer to evaluation for details):  ADL's;IADL's;Leisure    Rehab Potential  Good    Current Impairments/barriers affecting progress:  positive: family support, negative: level of motivation, progressive disease    OT Frequency  2x / week    OT Duration  12 weeks    OT Treatment/Interventions  Self-care/ADL training;Moist Heat;DME and/or AE instruction;Balance training;Therapeutic activities;Therapeutic exercise;Neuromuscular education;Functional Mobility Training;Patient/family education    Consulted and Agree with Plan of Care  Patient    Family Member Consulted  wife, Gordy Levan       Patient will benefit from skilled therapeutic intervention in order to improve the following deficits and impairments:  Abnormal gait, Decreased coordination, Decreased range of motion, Difficulty walking, Decreased endurance, Decreased safety awareness, Decreased activity tolerance, Decreased balance, Impaired UE functional use, Pain, Decreased  mobility, Decreased strength  Visit Diagnosis: Muscle weakness (generalized)  Other lack of coordination  Unsteadiness on feet  Abnormal posture  Difficulty in walking, not elsewhere classified  Chronic right shoulder pain    Problem List Patient Active Problem List   Diagnosis Date Noted  . Dementia due to Parkinson's disease without behavioral disturbance ([place]) 07/18/2017  . Neurogenic orthostatic hypotension ([place]) 12/02/2016  . BPH with urinary obstruction 11/14/2016  . Urinary frequency 10/10/2016  . Microscopic hematuria 10/10/2016  . Parkinson's syndrome ([place]) 06/14/2016  . Arthritis 06/14/2016  . Depression 06/14/2016  . Heartburn 06/14/2016  . Urinary incontinence 06/14/2016  . Skin lesion 06/14/2016  . Dementia ([place]) 06/14/2016  . Bilateral edema of lower extremity 06/14/2016   Achilles Dunk, OTR/L, CLT  , 10/23/2018, 6:20 PM  [place] MAIN [facility] SERVICES [address] [place], [place], [address] Phone: [phone]   Fax:  [phone]  Name: Brayam Boeke MRN: [id number] Date of Birth: 1933/09/25

## 2018-10-26 ENCOUNTER — Ambulatory Visit: Payer: Medicare Other | Attending: Physician Assistant | Admitting: Occupational Therapy

## 2018-10-26 DIAGNOSIS — R2681 Unsteadiness on feet: Secondary | ICD-10-CM | POA: Insufficient documentation

## 2018-10-26 DIAGNOSIS — R278 Other lack of coordination: Secondary | ICD-10-CM | POA: Diagnosis present

## 2018-10-26 DIAGNOSIS — M25511 Pain in right shoulder: Secondary | ICD-10-CM | POA: Insufficient documentation

## 2018-10-26 DIAGNOSIS — M6281 Muscle weakness (generalized): Secondary | ICD-10-CM | POA: Diagnosis present

## 2018-10-26 DIAGNOSIS — R262 Difficulty in walking, not elsewhere classified: Secondary | ICD-10-CM | POA: Insufficient documentation

## 2018-10-26 DIAGNOSIS — G8929 Other chronic pain: Secondary | ICD-10-CM | POA: Insufficient documentation

## 2018-10-26 DIAGNOSIS — R293 Abnormal posture: Secondary | ICD-10-CM

## 2018-10-27 ENCOUNTER — Encounter: Payer: Self-pay | Admitting: Occupational Therapy

## 2018-10-27 NOTE — Therapy (Signed)
[place] MAIN [facility] SERVICES [address] [place], [place], [address] Phone: [phone]   Fax:  [phone]  Occupational Therapy Treatment  Patient Details  Name: Luke Durham MRN: [id number] Date of Birth: 05-14-1933 No data recorded  Encounter Date: 10/26/2018  OT End of Session - 10/27/18 1658    Visit Number  159    Number of Visits  180    Date for OT Re-Evaluation  01/11/19    Authorization Type  Medicare visit 9 of 10, reporting period started 07/20/2018    OT Start Time  1110    OT Stop Time  1200    OT Time Calculation (min)  50 min    Activity Tolerance  Patient tolerated treatment well    Behavior During Therapy  [facility] for tasks assessed/performed       Past Medical History:  Diagnosis Date  . Anxiety   . Arthritis   . Asthma    childhood asthma  . Cancer ([place]) 7 years ago   lymphoma, [place] (recent)  . Chronic kidney disease   . Colon polyps   . GERD (gastroesophageal reflux disease)   . History of kidney stones   . Parkinson disease [facility])     Past Surgical History:  Procedure Laterality Date  . COLON SURGERY    . CYSTOSCOPY WITH LITHOLAPAXY N/A 11/14/2016   Procedure: CYSTOSCOPY WITH LITHOLAPAXY;  Surgeon: Hollice Espy, MD;  Location: [facility] ORS;  Service: Urology;  Laterality: N/A;  . EYE SURGERY Bilateral    Cataract Extraction with IOL  . HERNIA REPAIR  20 years  . MOHS SURGERY    . SMALL INTESTINE SURGERY     Per patient 7 years  . TRANSURETHRAL RESECTION OF PROSTATE N/A 11/14/2016   Procedure: TRANSURETHRAL RESECTION OF THE PROSTATE (TURP);  Surgeon: Hollice Espy, MD;  Location: [facility] ORS;  Service: Urology;  Laterality: N/A;    There were no vitals filed for this visit.  Subjective Assessment - 10/27/18 1656    Subjective   Patient reports he is frustrated, had a bad morning, took a long time to get ready, felt rushed and fell without injury.     Pertinent History  Patient reports he was diagnosed with  Parkinson's disease about 10 plus years ago.  He reports a more recent decline in function in his daily activities in the last 6-12 months.  He has been seeing PT for the last couple months.    Patient Stated Goals  Patient reports he wants to be able to do more for himself and around the house.     Currently in Pain?  Yes    Pain Score  4            Moist heat prior to session for 5 minutes to right shoulder to decrease pain and increase ROM prior to exercises.   Patient seen for instruction of LSVT BIG exercises: LSVT Daily Session Maximal Daily Exercises: Sustained movements are designed to rescale the amplitude of movement output for generalization to daily functional activities. Performed as follows for 1 set of 10 repetitions each: Multi directional sustained movements- 1) Floor to ceiling, 2) Side to side. Multi directional Repetitive movements performed in standing and are designed to provide retraining effort needed for sustained muscle activation in tasks Performed as follows: 3) Step and reach forward, 4) Step and Reach Backwards, 5) Step and reach sideways, 6) Rock and reach forward/backward, 7) Rock and reach sideways.  Exercises modified in sitting  due to low BP.  Postural exercises in sitting this date, cues for positioning of self in relation to walker.       BP 83/53 this date then 87/67, patient denies any light headedness at the clinic and denies any with falls at home.  PT order received, will work towards coordinating PT eval to further address balance and recent falls.                     OT Education - 10/27/18 1657    Education provided  Yes    Education Details  fall risk, safety and use of assistive device    Person(s) Educated  Patient;Spouse    Methods  Demonstration;Explanation;Verbal cues;Tactile cues    Comprehension  Verbal cues required;Returned demonstration;Verbalized understanding;Tactile cues required          OT Long Term Goals -  10/19/18 1544      OT LONG TERM GOAL #1   Title  Patient will demonstrate increase right hand grip to be able to cut food with modified independence.     Baseline  occasional assist required.    Time  12    Period  Weeks    Status  On-going      OT LONG TERM GOAL #2   Title  Patient will complete donning long sleeve shirt with modified independence.    Baseline  requires assist with long sleeves, can perform short sleeves.    Time  12    Period  Weeks    Status  Achieved      OT LONG TERM GOAL #3   Title  Patient will improve coordination to manage buttons on clothing with occasional assistance only.    Baseline  requires assist with buttons each time    Time  12    Period  Weeks    Status  On-going      OT LONG TERM GOAL #4   Title  Patient will demonstrate upright posture with shoulders back and head up during exercises with minimal cues.      Baseline  moderate cues for positioning during exercises.     Time  12    Period  Weeks    Status  On-going      OT LONG TERM GOAL #5   Title  Patient will demonstrate turning behaviors with rollator using large steps and keeping feet behind the rollator with all turns with occasional minimal cues.     Time  12    Period  Weeks    Status  On-going      OT LONG TERM GOAL #6   Title  Patient will improve gait speed and endurance and be able to walk 950 feet in 6 minutes to negotiate around the home and community safely in 12 weeks    Baseline  860 at reassessment, 979 at recert 1/50    Time  4    Period  Weeks    Status  On-going      OT LONG TERM GOAL #7   Title  Patient will complete HEP for maximal daily exercises with modified independence in 4 weeks    Time  12    Period  Weeks    Status  Partially Met      OT LONG TERM GOAL #8   Title  Patient will transfer from sit to stand without the use of arms safely and independently from a variety of chairs/surfaces in 4 weeks.  Baseline  difficulty from lower surfaces    Time   4    Period  Weeks    Status  Achieved      OT LONG TERM GOAL  #9   Baseline  Patient will demonstrate floor transfers with use of chair to assist with min guard and cues if needed.     Time  12    Period  Weeks    Status  On-going            Plan - 10/27/18 1658    Clinical Impression Statement  Patient continues to have multiple falls over the last few weeks, he reports many times this happens when his rollator gets too far ahead of him and he cannot correct.  This morning he felt rushed to get ready, his performance often declines when he feels pressured, rushed or timed.  We received a PT order for balance and gait.  Patient reluctant to have more therapy and reports frustration over status in recent weeks.  Discussed the importance and need for PT eval to further address limitations and to work in conjunction with OT.  Patient agreeable by the end of the session, will work towards getting PT eval scheduled.      Occupational Profile and client history currently impacting functional performance  progressive disease process, freezing of gait behaviors, fall risk    Occupational performance deficits (Please refer to evaluation for details):  ADL's;IADL's;Leisure    Rehab Potential  Good    Current Impairments/barriers affecting progress:  positive: family support, negative: level of motivation, progressive disease    OT Frequency  2x / week    OT Duration  12 weeks    OT Treatment/Interventions  Self-care/ADL training;Moist Heat;DME and/or AE instruction;Balance training;Therapeutic activities;Therapeutic exercise;Neuromuscular education;Functional Mobility Training;Patient/family education    Consulted and Agree with Plan of Care  Patient    Family Member Consulted  wife, Gordy Levan       Patient will benefit from skilled therapeutic intervention in order to improve the following deficits and impairments:  Abnormal gait, Decreased coordination, Decreased range of motion, Difficulty  walking, Decreased endurance, Decreased safety awareness, Decreased activity tolerance, Decreased balance, Impaired UE functional use, Pain, Decreased mobility, Decreased strength  Visit Diagnosis: Muscle weakness (generalized)  Other lack of coordination  Unsteadiness on feet  Abnormal posture  Difficulty in walking, not elsewhere classified  Chronic right shoulder pain    Problem List Patient Active Problem List   Diagnosis Date Noted  . Dementia due to Parkinson's disease without behavioral disturbance ([place]) 07/18/2017  . Neurogenic orthostatic hypotension ([place]) 12/02/2016  . BPH with urinary obstruction 11/14/2016  . Urinary frequency 10/10/2016  . Microscopic hematuria 10/10/2016  . Parkinson's syndrome ([place]) 06/14/2016  . Arthritis 06/14/2016  . Depression 06/14/2016  . Heartburn 06/14/2016  . Urinary incontinence 06/14/2016  . Skin lesion 06/14/2016  . Dementia ([place]) 06/14/2016  . Bilateral edema of lower extremity 06/14/2016   Achilles Dunk, OTR/L, CLT  , 10/27/2018, 5:05 PM  [place] MAIN [facility] SERVICES [address] [place], [place], [address] Phone: [phone]   Fax:  [phone]  Name: Luke Durham MRN: [id number] Date of Birth: 01-04-1933

## 2018-10-29 ENCOUNTER — Ambulatory Visit: Payer: Medicare Other | Admitting: Occupational Therapy

## 2018-10-29 DIAGNOSIS — R278 Other lack of coordination: Secondary | ICD-10-CM

## 2018-10-29 DIAGNOSIS — G8929 Other chronic pain: Secondary | ICD-10-CM

## 2018-10-29 DIAGNOSIS — M6281 Muscle weakness (generalized): Secondary | ICD-10-CM

## 2018-10-29 DIAGNOSIS — R2681 Unsteadiness on feet: Secondary | ICD-10-CM

## 2018-10-29 DIAGNOSIS — R262 Difficulty in walking, not elsewhere classified: Secondary | ICD-10-CM

## 2018-10-29 DIAGNOSIS — M25511 Pain in right shoulder: Secondary | ICD-10-CM

## 2018-10-29 DIAGNOSIS — R293 Abnormal posture: Secondary | ICD-10-CM

## 2018-11-02 ENCOUNTER — Ambulatory Visit: Payer: Medicare Other | Admitting: Occupational Therapy

## 2018-11-02 DIAGNOSIS — M6281 Muscle weakness (generalized): Secondary | ICD-10-CM

## 2018-11-02 DIAGNOSIS — R278 Other lack of coordination: Secondary | ICD-10-CM

## 2018-11-02 DIAGNOSIS — R2681 Unsteadiness on feet: Secondary | ICD-10-CM

## 2018-11-02 DIAGNOSIS — R262 Difficulty in walking, not elsewhere classified: Secondary | ICD-10-CM

## 2018-11-02 DIAGNOSIS — M25511 Pain in right shoulder: Secondary | ICD-10-CM

## 2018-11-02 DIAGNOSIS — R293 Abnormal posture: Secondary | ICD-10-CM

## 2018-11-02 DIAGNOSIS — G8929 Other chronic pain: Secondary | ICD-10-CM

## 2018-11-05 ENCOUNTER — Ambulatory Visit: Payer: Medicare Other | Admitting: Occupational Therapy

## 2018-11-05 DIAGNOSIS — G8929 Other chronic pain: Secondary | ICD-10-CM

## 2018-11-05 DIAGNOSIS — R262 Difficulty in walking, not elsewhere classified: Secondary | ICD-10-CM

## 2018-11-05 DIAGNOSIS — M25511 Pain in right shoulder: Secondary | ICD-10-CM

## 2018-11-05 DIAGNOSIS — R2681 Unsteadiness on feet: Secondary | ICD-10-CM

## 2018-11-05 DIAGNOSIS — M6281 Muscle weakness (generalized): Secondary | ICD-10-CM

## 2018-11-05 DIAGNOSIS — R293 Abnormal posture: Secondary | ICD-10-CM

## 2018-11-05 DIAGNOSIS — R278 Other lack of coordination: Secondary | ICD-10-CM

## 2018-11-07 ENCOUNTER — Ambulatory Visit: Payer: Medicare Other | Admitting: Physical Therapy

## 2018-11-07 ENCOUNTER — Encounter: Payer: Self-pay | Admitting: Physical Therapy

## 2018-11-07 DIAGNOSIS — M6281 Muscle weakness (generalized): Secondary | ICD-10-CM

## 2018-11-07 DIAGNOSIS — R2681 Unsteadiness on feet: Secondary | ICD-10-CM

## 2018-11-07 DIAGNOSIS — R278 Other lack of coordination: Secondary | ICD-10-CM

## 2018-11-07 NOTE — Therapy (Signed)
[place] MAIN [facility] SERVICES [address] [place], [place], [address] Phone: [phone]   Fax:  [phone]  Physical Therapy Evaluation  Patient Details  Name: Luke Durham MRN: [id number] Date of Birth: May 08, 1933 Referring Provider (PT): Dr. Manuella Ghazi   Encounter Date: 11/07/2018  PT End of Session - 11/07/18 1446    Visit Number  1    Number of Visits  17    Date for PT Re-Evaluation  01/02/19    Authorization Type  eval on 11/07/18    PT Start Time  1120    PT Stop Time  1200    PT Time Calculation (min)  40 min    Equipment Utilized During Treatment  Gait belt    Activity Tolerance  Patient limited by fatigue;Patient tolerated treatment well    Behavior During Therapy  [facility] for tasks assessed/performed;Flat affect       Past Medical History:  Diagnosis Date  . Anxiety   . Arthritis   . Asthma    childhood asthma  . Cancer ([place]) 7 years ago   lymphoma, [place] (recent)  . Chronic kidney disease   . Colon polyps   . GERD (gastroesophageal reflux disease)   . History of kidney stones   . Parkinson disease [facility])     Past Surgical History:  Procedure Laterality Date  . COLON SURGERY    . CYSTOSCOPY WITH LITHOLAPAXY N/A 11/14/2016   Procedure: CYSTOSCOPY WITH LITHOLAPAXY;  Surgeon: Hollice Espy, MD;  Location: [facility] ORS;  Service: Urology;  Laterality: N/A;  . EYE SURGERY Bilateral    Cataract Extraction with IOL  . HERNIA REPAIR  20 years  . MOHS SURGERY    . SMALL INTESTINE SURGERY     Per patient 7 years  . TRANSURETHRAL RESECTION OF PROSTATE N/A 11/14/2016   Procedure: TRANSURETHRAL RESECTION OF THE PROSTATE (TURP);  Surgeon: Hollice Espy, MD;  Location: [facility] ORS;  Service: Urology;  Laterality: N/A;    There were no vitals filed for this visit.   Subjective Assessment - 11/07/18 1128    Subjective  My walking has significantly declined and I have been falling more frequently;     Pertinent History  85 yo Male  diagnosed with parkinson's disease presents to PT with worsening gait, balance. He reports increased episodes of freezing and has a follow up appointment with Dr. Manuella Ghazi to assess medication and overall disease progression. Patient has been through [place] and reports mixed adherence to HEP. He reports, "I don't do the exercises as much as I should because of laziness and they aren't much fun." He reports falling every few days (8 falls in last week) which occurs mostly during walking; Reports no change in memory; Does exhibit increased flexed posture;     How long can you sit comfortably?  N/A    How long can you stand comfortably?  10 min with forward flexed posture    How long can you walk comfortably?  level surface with rollator, limited to <200 feet;     Patient Stated Goals  reduce falls, improve flexibility; "Be safer walking."     Currently in Pain?  No/denies    Multiple Pain Sites  No         [facility] PT Assessment - 11/07/18 0001      Assessment   Medical Diagnosis  Parkinsons' Disease    Referring Provider (PT)  Dr. Manuella Ghazi    Onset Date/Surgical Date  --   worsening symptoms  in last 2 months   Hand Dominance  Right    Next MD Visit  11/20/18    Prior Therapy  has had PT in past; none recently;       Precautions   Precautions  Fall      Restrictions   Weight Bearing Restrictions  No      Balance Screen   Has the patient fallen in the past 6 months  Yes    How many times?  15+    Has the patient had a decrease in activity level because of a fear of falling?   Yes    Is the patient reluctant to leave their home because of a fear of falling?   Yes      [place]  Private residence    Living Arrangements  Spouse/significant other    Available Help at Discharge  Family    Type of [place] Access  Level entry    [place] bars - toilet;Grab bars - tub/shower;Shower seat - built in;Walker - 2 wheels     Additional Comments  Home is handicap accessible, has 2nd story but wife states it is not completed/used      Prior Function   Level of Independence  Needs assistance with ADLs    Vocation  Retired    Associate Professor, tennis   Chiropodist   Overall Cognitive Status  Difficult to assess    Memory  --   remembered 2/3 items after 20 min conversation;      Observation/Other Assessments   Observations  pleasant gentleman; does exhibit flexed posture and freezing gait      Sensation   Light Touch  Appears Intact      Coordination   Gross Motor Movements are Fluid and Coordinated  No    Fine Motor Movements are Fluid and Coordinated  No    Finger Nose Finger Test  --   R: 6, L: 8 in 10 sec; does have mild dysmetria      Posture/Postural Control   Posture Comments  Mild-moderate kyphosis with forward flexed posture that is not fully corrected with VCs for sitting technique; forward head posture      AROM   Overall AROM Comments  limited shoulder flexion due to shoulder pain;       Strength   Overall Strength Comments  BUE: grossly 3+/5    Right Hip Flexion  3-/5   has pain with resistance   Left Hip Flexion  4+/5    Right Knee Flexion  4+/5    Right Knee Extension  4+/5    Left Knee Flexion  4+/5    Left Knee Extension  4+/5    Right Ankle Dorsiflexion  4-/5    Left Ankle Dorsiflexion  4-/5      Transfers   Comments  requires UE to pull up to stand; exhibits decreased safety awareness with poor hand placement and poor use of AD; increased time required, supervision      Ambulation/Gait   Gait Comments  Ambulates with rollator, freezing gait especially during turns with poor rollator management, CGA to close supervision, short shuffled steps with severe forward flexed posture      Standardized Balance Assessment   10 Meter Walk  0.66 m/s with rollator (limited home ambulator, more impaired than gait speed on 01/25/17  which was 0.833 m/s      Timed Up  and Go Test   Normal TUG (seconds)  58    TUG Comments  with rollator, high fall risk; more impaired than where he was on 01/25/17 which was 39 sec with RW                Objective measurements completed on examination: See above findings.   Will address HEP next visit           PT Education - 11/07/18 1445    Education provided  Yes    Education Details  plan of care, recommendations    Person(s) Educated  Patient;Spouse    Methods  Explanation    Comprehension  Verbalized understanding       PT Short Term Goals - 11/07/18 1454      PT SHORT TERM GOAL #1   Title  Patient will be adherent to HEP at least 3x a week to improve functional strength and balance for better safety at home.    Time  4    Period  Weeks    Status  New    Target Date  12/05/18      PT SHORT TERM GOAL #2   Title  Patient will deny any falls for 5 days to demonstrate improved safety awareness at home and work.     Time  4    Period  Weeks    Status  New    Target Date  12/05/18      PT SHORT TERM GOAL #3   Title  Patient will exhibits good hand placement with transfers at least 50% of the time to exhibit improved safety awarness and reduce falls    Time  4    Period  Weeks    Status  New    Target Date  12/05/18        PT Long Term Goals - 11/07/18 1455      PT LONG TERM GOAL #1   Title  Patient will exhibit improved safety awareness with gait with better rollator management, keeping walker in optimum position at least 50% of the time during turns.     Time  8    Period  Weeks    Status  New    Target Date  01/02/19      PT LONG TERM GOAL #2   Title  Patient will decrease TUG test to <30 seconds in order to achieve an independently mobile status and decrease his risk for falls    Time  8    Period  Weeks    Status  New    Target Date  01/02/19      PT LONG TERM GOAL #3   Title  Patient will increase overall strength to 4+/5 in both UE and LE in order to complete  transfers safely with decreased risk for falls.     Time  8    Period  Weeks    Status  New    Target Date  01/02/19             Plan - 11/07/18 1449    Clinical Impression Statement  85 yo Male with Parkinson's Disease presents to therapy with increased imbalance and progression of gait difficulties. He ambulates with rollator, with forward flexed posture, and short shuffled steps, with freezing episodes especially with turns. Patient did receive PT in 2018. He does exhibit a progression of symptoms with more impairment in  gait outcome measures as compared to 2018. He also exhibits impaired safety awareness with poor management of rollator with functional tasks. He reports increased episodes of falls, experiencing 1 every other day. Patient would benefit from additional skilled PT Intervention to improve strength, balance and gait safety;     History and Personal Factors relevant to plan of care:  lives with wife but she has been less attentive, single story home, multiple falls, high fall risk, progressive disorder    Clinical Presentation  Unstable    Clinical Presentation due to:  high fall risk with multiple falls; progressive disorder    Clinical Decision Making  High    Rehab Potential  Fair    Clinical Impairments Affecting Rehab Potential  Positive factors: motivated, family support, previous good responses to PT; Negative factors: high co-pay, progressive disease; Clinical Presentation: Evolving-multiple falls    PT Frequency  2x / week    PT Duration  8 weeks    PT Treatment/Interventions  ADLs/Self Care Home Management;Aquatic Therapy;Electrical Stimulation;Moist Heat;DME Instruction;Gait training;Stair training;Functional mobility training;Therapeutic activities;Therapeutic exercise;Balance training;Neuromuscular re-education;Patient/family education;Manual techniques;Energy conservation    PT Next Visit Plan  work on Animal nutritionist;     [place]  continue as  given;     Consulted and Agree with Plan of Care  Patient;Family member/caregiver       Patient will benefit from skilled therapeutic intervention in order to improve the following deficits and impairments:  Abnormal gait, Decreased balance, Decreased endurance, Decreased knowledge of use of DME, Decreased mobility, Decreased range of motion, Decreased safety awareness, Decreased strength, Difficulty walking, Hypomobility, Increased fascial restricitons, Impaired perceived functional ability, Impaired flexibility, Improper body mechanics, Postural dysfunction  Visit Diagnosis: Muscle weakness (generalized)  Other lack of coordination  Unsteadiness on feet     Problem List Patient Active Problem List   Diagnosis Date Noted  . Dementia due to Parkinson's disease without behavioral disturbance ([place]) 07/18/2017  . Neurogenic orthostatic hypotension ([place]) 12/02/2016  . BPH with urinary obstruction 11/14/2016  . Urinary frequency 10/10/2016  . Microscopic hematuria 10/10/2016  . Parkinson's syndrome ([place]) 06/14/2016  . Arthritis 06/14/2016  . Depression 06/14/2016  . Heartburn 06/14/2016  . Urinary incontinence 06/14/2016  . Skin lesion 06/14/2016  . Dementia ([place]) 06/14/2016  . Bilateral edema of lower extremity 06/14/2016    Trotter,Margaret PT, DPT 11/07/2018, 2:57 PM  [place] MAIN [facility] SERVICES [address] [place], [place], [address] Phone: [phone]   Fax:  [phone]  Name: Luke Durham MRN: [id number] Date of Birth: [birth year]

## 2018-11-10 ENCOUNTER — Encounter: Payer: Self-pay | Admitting: Occupational Therapy

## 2018-11-10 NOTE — Therapy (Signed)
[place] MAIN [facility] SERVICES [address] [place], [place], [address] Phone: [phone]   Fax:  [phone]  Occupational Therapy Treatment  Patient Details  Name: Luke Durham MRN: [id number] Date of Birth: [birth year] No data recorded  Encounter Date: 11/05/2018  OT End of Session - 11/10/18 1555    Visit Number  162    Number of Visits  180    Date for OT Re-Evaluation  01/11/19    Authorization Type  Medicare visit 2 of 10, reporting period started 07/20/2018    OT Start Time  1101    OT Stop Time  1204    OT Time Calculation (min)  63 min    Activity Tolerance  Patient tolerated treatment well    Behavior During Therapy  [facility] for tasks assessed/performed;Flat affect       Past Medical History:  Diagnosis Date  . Anxiety   . Arthritis   . Asthma    childhood asthma  . Cancer ([place]) 7 years ago   lymphoma, [place] (recent)  . Chronic kidney disease   . Colon polyps   . GERD (gastroesophageal reflux disease)   . History of kidney stones   . Parkinson disease [facility])     Past Surgical History:  Procedure Laterality Date  . COLON SURGERY    . CYSTOSCOPY WITH LITHOLAPAXY N/A 11/14/2016   Procedure: CYSTOSCOPY WITH LITHOLAPAXY;  Surgeon: Hollice Espy, MD;  Location: [facility] ORS;  Service: Urology;  Laterality: N/A;  . EYE SURGERY Bilateral    Cataract Extraction with IOL  . HERNIA REPAIR  20 years  . MOHS SURGERY    . SMALL INTESTINE SURGERY     Per patient 7 years  . TRANSURETHRAL RESECTION OF PROSTATE N/A 11/14/2016   Procedure: TRANSURETHRAL RESECTION OF THE PROSTATE (TURP);  Surgeon: Hollice Espy, MD;  Location: [facility] ORS;  Service: Urology;  Laterality: N/A;    There were no vitals filed for this visit.  Subjective Assessment - 11/10/18 1552    Subjective   Patient reports Pain in right groin and the left leg, bilateral UEs more on right than left.    Pertinent History  Patient reports he was diagnosed with Parkinson's  disease about 10 plus years ago.  He reports a more recent decline in function in his daily activities in the last 6-12 months.  He has been seeing PT for the last couple months.    Patient Stated Goals  Patient reports he wants to be able to do more for himself and around the house.     Currently in Pain?  Yes    Pain Score  8     Pain Location  Leg    Pain Orientation  Left    Pain Descriptors / Indicators  Aching    Pain Type  Acute pain    Pain Onset  1 to 4 weeks ago    Multiple Pain Sites  Yes    Pain Score  8    Pain Location  Groin    Pain Descriptors / Indicators  Aching    Pain Type  Acute pain    Pain Onset  1 to 4 weeks ago    Pain Frequency  Intermittent                           OT Education - 11/10/18 1554    Education provided  Yes    Education Details  modification  of exercises to perform in sitting    Person(s) Educated  Patient;Spouse    Methods  Demonstration;Explanation;Verbal cues;Tactile cues    Comprehension  Verbal cues required;Returned demonstration;Verbalized understanding;Tactile cues required          OT Long Term Goals - 11/10/18 1541      OT LONG TERM GOAL #1   Title  Patient will demonstrate increase right hand grip to be able to cut food with modified independence.     Baseline  occasional assist required.    Time  12    Period  Weeks    Status  On-going      OT LONG TERM GOAL #2   Title  Patient will complete donning long sleeve shirt with modified independence.    Baseline  requires assist with long sleeves, can perform short sleeves.    Time  12    Period  Weeks    Status  Achieved      OT LONG TERM GOAL #3   Title  Patient will improve coordination to manage buttons on clothing with occasional assistance only.    Baseline  requires assist with buttons each time    Time  12    Period  Weeks    Status  On-going      OT LONG TERM GOAL #4   Title  Patient will demonstrate upright posture with shoulders back  and head up during exercises with minimal cues.      Baseline  moderate cues for positioning during exercises.     Time  12    Period  Weeks    Status  On-going      OT LONG TERM GOAL #5   Title  Patient will demonstrate turning behaviors with rollator using large steps and keeping feet behind the rollator with all turns with occasional minimal cues.     Time  12    Period  Weeks    Status  On-going      OT LONG TERM GOAL #6   Title  Patient will improve gait speed and endurance and be able to walk 950 feet in 6 minutes to negotiate around the home and community safely in 12 weeks    Baseline  860 at reassessment, 671 at recert 2/45, 809 feet 07/01/3381    Time  4    Period  Weeks    Status  On-going      OT LONG TERM GOAL #7   Title  Patient will complete HEP for maximal daily exercises with modified independence in 4 weeks    Time  12    Period  Weeks    Status  On-going      OT LONG TERM GOAL #8   Title  Patient will transfer from sit to stand without the use of arms safely and independently from a variety of chairs/surfaces in 4 weeks.     Baseline  difficulty from lower surfaces    Time  4    Period  Weeks    Status  Achieved      OT LONG TERM GOAL  #9   Baseline  Patient will demonstrate floor transfers with use of chair to assist with min guard and cues if needed.     Time  12    Period  Weeks    Status  On-going            Plan - 11/10/18 1555    Clinical Impression Statement  Patient with increased  difficulty with performing daily exercises this date, pain in right groin area as well as left leg.      Occupational Profile and client history currently impacting functional performance  progressive disease process, freezing of gait behaviors, fall risk    Occupational performance deficits (Please refer to evaluation for details):  ADL's;IADL's;Leisure    Rehab Potential  Good    Current Impairments/barriers affecting progress:  positive: family support, negative:  level of motivation, progressive disease    OT Frequency  2x / week    OT Duration  12 weeks    OT Treatment/Interventions  Self-care/ADL training;Moist Heat;DME and/or AE instruction;Balance training;Therapeutic activities;Therapeutic exercise;Neuromuscular education;Functional Mobility Training;Patient/family education    Consulted and Agree with Plan of Care  Patient    Family Member Consulted  wife, Gordy Levan       Patient will benefit from skilled therapeutic intervention in order to improve the following deficits and impairments:  Abnormal gait, Decreased coordination, Decreased range of motion, Difficulty walking, Decreased endurance, Decreased safety awareness, Decreased activity tolerance, Decreased balance, Impaired UE functional use, Pain, Decreased mobility, Decreased strength  Visit Diagnosis: Muscle weakness (generalized)  Other lack of coordination  Unsteadiness on feet  Abnormal posture  Difficulty in walking, not elsewhere classified  Chronic right shoulder pain    Problem List Patient Active Problem List   Diagnosis Date Noted  . Dementia due to Parkinson's disease without behavioral disturbance ([place]) 07/18/2017  . Neurogenic orthostatic hypotension ([place]) 12/02/2016  . BPH with urinary obstruction 11/14/2016  . Urinary frequency 10/10/2016  . Microscopic hematuria 10/10/2016  . Parkinson's syndrome ([place]) 06/14/2016  . Arthritis 06/14/2016  . Depression 06/14/2016  . Heartburn 06/14/2016  . Urinary incontinence 06/14/2016  . Skin lesion 06/14/2016  . Dementia ([place]) 06/14/2016  . Bilateral edema of lower extremity 06/14/2016   Achilles Dunk, OTR/L, CLT  , 11/10/2018, 3:57 PM  [place] MAIN [facility] SERVICES [address] [place], [place], [address] Phone: [phone]   Fax:  [phone]  Name: Luke Durham MRN: [id number] Date of Birth: 1933-03-21

## 2018-11-10 NOTE — Therapy (Signed)
[place] MAIN [facility] SERVICES [address] [place], [place], [address] Phone: [phone]   Fax:  [phone]  Occupational Therapy Treatment  Patient Details  Name: Luke Durham MRN: [id number] Date of Birth: [birth year] No data recorded  Encounter Date: 11/02/2018  OT End of Session - 11/10/18 1547    Visit Number  161    Number of Visits  180    Date for OT Re-Evaluation  01/11/19    Authorization Type  Medicare visit 1 of 10, reporting period started 07/20/2018    OT Start Time  1100    OT Stop Time  1205    OT Time Calculation (min)  65 min    Activity Tolerance  Patient tolerated treatment well    Behavior During Therapy  [facility] for tasks assessed/performed;Flat affect       Past Medical History:  Diagnosis Date  . Anxiety   . Arthritis   . Asthma    childhood asthma  . Cancer ([place]) 7 years ago   lymphoma, [place] (recent)  . Chronic kidney disease   . Colon polyps   . GERD (gastroesophageal reflux disease)   . History of kidney stones   . Parkinson disease [facility])     Past Surgical History:  Procedure Laterality Date  . COLON SURGERY    . CYSTOSCOPY WITH LITHOLAPAXY N/A 11/14/2016   Procedure: CYSTOSCOPY WITH LITHOLAPAXY;  Surgeon: Hollice Espy, MD;  Location: [facility] ORS;  Service: Urology;  Laterality: N/A;  . EYE SURGERY Bilateral    Cataract Extraction with IOL  . HERNIA REPAIR  20 years  . MOHS SURGERY    . SMALL INTESTINE SURGERY     Per patient 7 years  . TRANSURETHRAL RESECTION OF PROSTATE N/A 11/14/2016   Procedure: TRANSURETHRAL RESECTION OF THE PROSTATE (TURP);  Surgeon: Hollice Espy, MD;  Location: [facility] ORS;  Service: Urology;  Laterality: N/A;    There were no vitals filed for this visit.  Subjective Assessment - 11/10/18 1545    Subjective   Patient denied any falls over the weekend, reports he had one near miss.      Pertinent History  Patient reports he was diagnosed with Parkinson's disease about 10 plus  years ago.  He reports a more recent decline in function in his daily activities in the last 6-12 months.  He has been seeing PT for the last couple months.    Patient Stated Goals  Patient reports he wants to be able to do more for himself and around the house.     Currently in Pain?  Yes    Pain Score  3     Pain Location  Arm    Pain Orientation  Right    Pain Descriptors / Indicators  Aching                           OT Education - 11/10/18 1546    Education provided  Yes    Education Details  amplitude of steps, posture    Person(s) Educated  Patient;Spouse    Methods  Demonstration;Explanation;Verbal cues;Tactile cues    Comprehension  Verbal cues required;Returned demonstration;Verbalized understanding;Tactile cues required          OT Long Term Goals - 11/10/18 1541      OT LONG TERM GOAL #1   Title  Patient will demonstrate increase right hand grip to be able to cut food with modified independence.  Baseline  occasional assist required.    Time  12    Period  Weeks    Status  On-going      OT LONG TERM GOAL #2   Title  Patient will complete donning long sleeve shirt with modified independence.    Baseline  requires assist with long sleeves, can perform short sleeves.    Time  12    Period  Weeks    Status  Achieved      OT LONG TERM GOAL #3   Title  Patient will improve coordination to manage buttons on clothing with occasional assistance only.    Baseline  requires assist with buttons each time    Time  12    Period  Weeks    Status  On-going      OT LONG TERM GOAL #4   Title  Patient will demonstrate upright posture with shoulders back and head up during exercises with minimal cues.      Baseline  moderate cues for positioning during exercises.     Time  12    Period  Weeks    Status  On-going      OT LONG TERM GOAL #5   Title  Patient will demonstrate turning behaviors with rollator using large steps and keeping feet behind the  rollator with all turns with occasional minimal cues.     Time  12    Period  Weeks    Status  On-going      OT LONG TERM GOAL #6   Title  Patient will improve gait speed and endurance and be able to walk 950 feet in 6 minutes to negotiate around the home and community safely in 12 weeks    Baseline  860 at reassessment, 527 at recert 7/82, 423 feet 02/24/6143    Time  4    Period  Weeks    Status  On-going      OT LONG TERM GOAL #7   Title  Patient will complete HEP for maximal daily exercises with modified independence in 4 weeks    Time  12    Period  Weeks    Status  On-going      OT LONG TERM GOAL #8   Title  Patient will transfer from sit to stand without the use of arms safely and independently from a variety of chairs/surfaces in 4 weeks.     Baseline  difficulty from lower surfaces    Time  4    Period  Weeks    Status  Achieved      OT LONG TERM GOAL  #9   Baseline  Patient will demonstrate floor transfers with use of chair to assist with min guard and cues if needed.     Time  12    Period  Weeks    Status  On-going            Plan - 11/10/18 1547    Clinical Impression Statement  Patient continues to require cues for posture, step length and use of rollator.  Freezing of gait present with most turns but especially when patient is timed during task or feels rushed.  Continue to work towards goals to reduce fall risk, improve balance and increase independence in daily tasks.     Occupational Profile and client history currently impacting functional performance  progressive disease process, freezing of gait behaviors, fall risk    Occupational performance deficits (Please refer to evaluation for details):  ADL's;IADL's;Leisure    Rehab Potential  Good    Current Impairments/barriers affecting progress:  positive: family support, negative: level of motivation, progressive disease    OT Frequency  2x / week    OT Duration  12 weeks    OT Treatment/Interventions   Self-care/ADL training;Moist Heat;DME and/or AE instruction;Balance training;Therapeutic activities;Therapeutic exercise;Neuromuscular education;Functional Mobility Training;Patient/family education    Consulted and Agree with Plan of Care  Patient    Family Member Consulted  wife, Gordy Levan       Patient will benefit from skilled therapeutic intervention in order to improve the following deficits and impairments:  Abnormal gait, Decreased coordination, Decreased range of motion, Difficulty walking, Decreased endurance, Decreased safety awareness, Decreased activity tolerance, Decreased balance, Impaired UE functional use, Pain, Decreased mobility, Decreased strength  Visit Diagnosis: Muscle weakness (generalized)  Other lack of coordination  Unsteadiness on feet  Abnormal posture  Difficulty in walking, not elsewhere classified  Chronic right shoulder pain    Problem List Patient Active Problem List   Diagnosis Date Noted  . Dementia due to Parkinson's disease without behavioral disturbance ([place]) 07/18/2017  . Neurogenic orthostatic hypotension ([place]) 12/02/2016  . BPH with urinary obstruction 11/14/2016  . Urinary frequency 10/10/2016  . Microscopic hematuria 10/10/2016  . Parkinson's syndrome ([place]) 06/14/2016  . Arthritis 06/14/2016  . Depression 06/14/2016  . Heartburn 06/14/2016  . Urinary incontinence 06/14/2016  . Skin lesion 06/14/2016  . Dementia ([place]) 06/14/2016  . Bilateral edema of lower extremity 06/14/2016   Achilles Dunk, OTR/L, CLT  Lovett,Amy 11/10/2018, 3:50 PM  [place] MAIN [facility] SERVICES [address] [place], [place], [address] Phone: [phone]   Fax:  [phone]  Name: Luke Durham MRN: [id number] Date of Birth: [birth year]

## 2018-11-10 NOTE — Therapy (Signed)
[place] MAIN [facility] SERVICES [address] [place], [place], [address] Phone: [phone]   Fax:  [phone]  Occupational Therapy Treatment/Progress Update Reporting period from 07/20/2018 to 10/29/2018   Patient Details  Name: Luke Durham MRN: [id number] Date of Birth: 06/09/1933 No data recorded  Encounter Date: 10/29/2018  OT End of Session - 11/10/18 1536    Visit Number  160    Number of Visits  180    Date for OT Re-Evaluation  01/11/19    Authorization Type  Medicare visit 10 of 10, reporting period started 07/20/2018    OT Start Time  1103    OT Stop Time  1201    OT Time Calculation (min)  58 min    Activity Tolerance  Patient tolerated treatment well    Behavior During Therapy  [facility] for tasks assessed/performed;Flat affect       Past Medical History:  Diagnosis Date  . Anxiety   . Arthritis   . Asthma    childhood asthma  . Cancer ([place]) 7 years ago   lymphoma, [place] (recent)  . Chronic kidney disease   . Colon polyps   . GERD (gastroesophageal reflux disease)   . History of kidney stones   . Parkinson disease [facility])     Past Surgical History:  Procedure Laterality Date  . COLON SURGERY    . CYSTOSCOPY WITH LITHOLAPAXY N/A 11/14/2016   Procedure: CYSTOSCOPY WITH LITHOLAPAXY;  Surgeon: Hollice Espy, MD;  Location: [facility] ORS;  Service: Urology;  Laterality: N/A;  . EYE SURGERY Bilateral    Cataract Extraction with IOL  . HERNIA REPAIR  20 years  . MOHS SURGERY    . SMALL INTESTINE SURGERY     Per patient 7 years  . TRANSURETHRAL RESECTION OF PROSTATE N/A 11/14/2016   Procedure: TRANSURETHRAL RESECTION OF THE PROSTATE (TURP);  Surgeon: Hollice Espy, MD;  Location: [facility] ORS;  Service: Urology;  Laterality: N/A;    There were no vitals filed for this visit.  Subjective Assessment - 11/10/18 1534    Subjective   Patient asking for therapist to check his blood pressure.  95/51 this date, patient denies any  dizziness.  He reports continued difficulty with mobility and recent falls.     Pertinent History  Patient reports he was diagnosed with Parkinson's disease about 10 plus years ago.  He reports a more recent decline in function in his daily activities in the last 6-12 months.  He has been seeing PT for the last couple months.    Patient Stated Goals  Patient reports he wants to be able to do more for himself and around the house.     Currently in Pain?  Yes    Pain Score  4     Pain Location  Arm    Pain Orientation  Right    Pain Descriptors / Indicators  Aching    Pain Type  Chronic pain    Pain Onset  More than a month ago    Pain Frequency  Intermittent                           OT Education - 11/10/18 1535    Education provided  Yes    Education Details  fall risk, safety and use of assistive device    Person(s) Educated  Patient;Spouse    Methods  Demonstration;Explanation;Verbal cues;Tactile cues    Comprehension  Verbal cues required;Returned demonstration;Verbalized  understanding;Tactile cues required          OT Long Term Goals - 11/10/18 1541      OT LONG TERM GOAL #1   Title  Patient will demonstrate increase right hand grip to be able to cut food with modified independence.     Baseline  occasional assist required.    Time  12    Period  Weeks    Status  On-going      OT LONG TERM GOAL #2   Title  Patient will complete donning long sleeve shirt with modified independence.    Baseline  requires assist with long sleeves, can perform short sleeves.    Time  12    Period  Weeks    Status  Achieved      OT LONG TERM GOAL #3   Title  Patient will improve coordination to manage buttons on clothing with occasional assistance only.    Baseline  requires assist with buttons each time    Time  12    Period  Weeks    Status  On-going      OT LONG TERM GOAL #4   Title  Patient will demonstrate upright posture with shoulders back and head up during  exercises with minimal cues.      Baseline  moderate cues for positioning during exercises.     Time  12    Period  Weeks    Status  On-going      OT LONG TERM GOAL #5   Title  Patient will demonstrate turning behaviors with rollator using large steps and keeping feet behind the rollator with all turns with occasional minimal cues.     Time  12    Period  Weeks    Status  On-going      OT LONG TERM GOAL #6   Title  Patient will improve gait speed and endurance and be able to walk 950 feet in 6 minutes to negotiate around the home and community safely in 12 weeks    Baseline  860 at reassessment, 962 at recert 8/36, 629 feet 01/28/6545    Time  4    Period  Weeks    Status  On-going      OT LONG TERM GOAL #7   Title  Patient will complete HEP for maximal daily exercises with modified independence in 4 weeks    Time  12    Period  Weeks    Status  On-going      OT LONG TERM GOAL #8   Title  Patient will transfer from sit to stand without the use of arms safely and independently from a variety of chairs/surfaces in 4 weeks.     Baseline  difficulty from lower surfaces    Time  4    Period  Weeks    Status  Achieved      OT LONG TERM GOAL  #9   Baseline  Patient will demonstrate floor transfers with use of chair to assist with min guard and cues if needed.     Time  12    Period  Weeks    Status  On-going            Plan - 11/10/18 1536    Clinical Impression Statement  Patient continues to complain of pain in his right arm but reports it improves with heat and movement.  He continues to demonstrate poor balance, increased flexed posture and increased freezing of gait  with turns.  He has suffered multiple falls in recent weeks and reports it just happens with walking from room to room.  Continue to work towards goals in [place] to increase independence in daily tasks.  PT eval scheduled soon.     Occupational Profile and client history currently impacting functional performance   progressive disease process, freezing of gait behaviors, fall risk    Occupational performance deficits (Please refer to evaluation for details):  ADL's;IADL's;Leisure    Rehab Potential  Good    Current Impairments/barriers affecting progress:  positive: family support, negative: level of motivation, progressive disease    OT Frequency  2x / week    OT Duration  12 weeks    OT Treatment/Interventions  Self-care/ADL training;Moist Heat;DME and/or AE instruction;Balance training;Therapeutic activities;Therapeutic exercise;Neuromuscular education;Functional Mobility Training;Patient/family education    Consulted and Agree with Plan of Care  Patient    Family Member Consulted  wife, Gordy Levan       Patient will benefit from skilled therapeutic intervention in order to improve the following deficits and impairments:  Abnormal gait, Decreased coordination, Decreased range of motion, Difficulty walking, Decreased endurance, Decreased safety awareness, Decreased activity tolerance, Decreased balance, Impaired UE functional use, Pain, Decreased mobility, Decreased strength  Visit Diagnosis: Muscle weakness (generalized)  Other lack of coordination  Unsteadiness on feet  Abnormal posture  Difficulty in walking, not elsewhere classified  Chronic right shoulder pain    Problem List Patient Active Problem List   Diagnosis Date Noted  . Dementia due to Parkinson's disease without behavioral disturbance ([place]) 07/18/2017  . Neurogenic orthostatic hypotension ([place]) 12/02/2016  . BPH with urinary obstruction 11/14/2016  . Urinary frequency 10/10/2016  . Microscopic hematuria 10/10/2016  . Parkinson's syndrome ([place]) 06/14/2016  . Arthritis 06/14/2016  . Depression 06/14/2016  . Heartburn 06/14/2016  . Urinary incontinence 06/14/2016  . Skin lesion 06/14/2016  . Dementia ([place]) 06/14/2016  . Bilateral edema of lower extremity 06/14/2016   Achilles Dunk, OTR/L, CLT  Lovett,Amy 11/10/2018, 3:43  PM  [place] MAIN [facility] SERVICES [address] [place], [place], [address] Phone: [phone]   Fax:  [phone]  Name: Luke Durham MRN: [id number] Date of Birth: [birth year]

## 2018-11-12 ENCOUNTER — Ambulatory Visit: Payer: Medicare Other | Admitting: Occupational Therapy

## 2018-11-12 DIAGNOSIS — M6281 Muscle weakness (generalized): Secondary | ICD-10-CM | POA: Diagnosis not present

## 2018-11-12 DIAGNOSIS — R2681 Unsteadiness on feet: Secondary | ICD-10-CM

## 2018-11-12 DIAGNOSIS — R278 Other lack of coordination: Secondary | ICD-10-CM

## 2018-11-12 DIAGNOSIS — R293 Abnormal posture: Secondary | ICD-10-CM

## 2018-11-14 ENCOUNTER — Encounter: Payer: Self-pay | Admitting: Physical Therapy

## 2018-11-14 ENCOUNTER — Ambulatory Visit: Payer: Medicare Other | Admitting: Physical Therapy

## 2018-11-14 DIAGNOSIS — R278 Other lack of coordination: Secondary | ICD-10-CM

## 2018-11-14 DIAGNOSIS — R2681 Unsteadiness on feet: Secondary | ICD-10-CM

## 2018-11-14 DIAGNOSIS — R293 Abnormal posture: Secondary | ICD-10-CM

## 2018-11-14 DIAGNOSIS — R262 Difficulty in walking, not elsewhere classified: Secondary | ICD-10-CM

## 2018-11-14 DIAGNOSIS — M6281 Muscle weakness (generalized): Secondary | ICD-10-CM

## 2018-11-14 NOTE — Therapy (Signed)
[place] MAIN [facility] SERVICES [address] [place], [place], [address] Phone: [phone]   Fax:  [phone]  Physical Therapy Treatment  Patient Details  Name: Luke Durham MRN: [id number] Date of Birth: 12/07/1932 Referring Provider (PT): Dr. Manuella Ghazi   Encounter Date: 11/14/2018  PT End of Session - 11/14/18 1336    Visit Number  2    Number of Visits  17    Date for PT Re-Evaluation  01/02/19    Authorization Type  eval on 11/07/18    PT Start Time  1302    PT Stop Time  1345    PT Time Calculation (min)  43 min    Equipment Utilized During Treatment  Gait belt    Activity Tolerance  Patient limited by fatigue;Patient tolerated treatment well    Behavior During Therapy  [facility] for tasks assessed/performed;Flat affect       Past Medical History:  Diagnosis Date  . Anxiety   . Arthritis   . Asthma    childhood asthma  . Cancer ([place]) 7 years ago   lymphoma, [place] (recent)  . Chronic kidney disease   . Colon polyps   . GERD (gastroesophageal reflux disease)   . History of kidney stones   . Parkinson disease [facility])     Past Surgical History:  Procedure Laterality Date  . COLON SURGERY    . CYSTOSCOPY WITH LITHOLAPAXY N/A 11/14/2016   Procedure: CYSTOSCOPY WITH LITHOLAPAXY;  Surgeon: Hollice Espy, MD;  Location: [facility] ORS;  Service: Urology;  Laterality: N/A;  . EYE SURGERY Bilateral    Cataract Extraction with IOL  . HERNIA REPAIR  20 years  . MOHS SURGERY    . SMALL INTESTINE SURGERY     Per patient 7 years  . TRANSURETHRAL RESECTION OF PROSTATE N/A 11/14/2016   Procedure: TRANSURETHRAL RESECTION OF THE PROSTATE (TURP);  Surgeon: Hollice Espy, MD;  Location: [facility] ORS;  Service: Urology;  Laterality: N/A;    There were no vitals filed for this visit.  Subjective Assessment - 11/14/18 1311    Subjective  Patient reports having a really bad day yesterday with severe pain and feeling very depressed with minimal movement; He  denies any fall yesterday, but just napped most of the day;     Pertinent History  85 yo Male diagnosed with parkinson's disease presents to PT with worsening gait, balance. He reports increased episodes of freezing and has a follow up appointment with Dr. Manuella Ghazi to assess medication and overall disease progression. Patient has been through [place] and reports mixed adherence to HEP. He reports, "I don't do the exercises as much as I should because of laziness and they aren't much fun." He reports falling every few days (8 falls in last week) which occurs mostly during walking; Reports no change in memory; Does exhibit increased flexed posture;     How long can you sit comfortably?  N/A    How long can you stand comfortably?  10 min with forward flexed posture    How long can you walk comfortably?  level surface with rollator, limited to <200 feet;     Patient Stated Goals  reduce falls, improve flexibility; "Be safer walking."     Currently in Pain?  Yes    Pain Score  6     Pain Location  Other (Comment)   all over   Pain Descriptors / Indicators  Aching;Sore    Pain Type  Chronic pain  Pain Onset  More than a month ago    Pain Frequency  Intermittent    Aggravating Factors   hard to say    Pain Relieving Factors  rest    Effect of Pain on Daily Activities  decreased activity level;     Multiple Pain Sites  No         [facility] PT Assessment - 11/14/18 0001      6 Minute Walk- Baseline   6 Minute Walk- Baseline  yes    BP (mmHg)  117/62    HR (bpm)  65    02 Sat (%RA)  100 %      6 Minute walk- Post Test   6 Minute Walk Post Test  no    BP (mmHg)  129/65    HR (bpm)  70    02 Sat (%RA)  100 %      6 minute walk test results    Aerobic Endurance Distance Walked  820    Endurance additional comments  with rollator      Berg Balance Test   Sit to Stand  Able to stand using hands after several tries    Standing Unsupported  Able to stand 30 seconds unsupported    Sitting with  Back Unsupported but Feet Supported on Floor or Stool  Able to sit 2 minutes under supervision    Stand to Sit  Controls descent by using hands    Transfers  Able to transfer with verbal cueing and /or supervision    Standing Unsupported with Eyes Closed  Able to stand 10 seconds with supervision    Standing Ubsupported with Feet Together  Able to place feet together independently but unable to hold for 30 seconds    From Standing, Reach Forward with Outstretched Arm  Can reach forward >12 cm safely (5")    From Standing Position, Pick up Object from Floor  Able to pick up shoe, needs supervision    From Standing Position, Turn to Look Behind Over each Shoulder  Looks behind from both sides and weight shifts well    Turn 360 Degrees  Needs close supervision or verbal cueing    Standing Unsupported, Alternately Place Feet on Step/Stool  Able to complete >2 steps/needs minimal assist    Standing Unsupported, One Foot in [employer] balance while stepping or standing    Standing on One Leg  Unable to try or needs assist to prevent fall    Total Score  29    Berg comment:  <36/56 indicates high fall risk;          TREATMENT: Instructed patient in [place] Balance assessment and 6 min walk test to assess safety with exercise and safety;  He does test as a high fall risk; Good cardiovascular response noted with 6 min walk test, although patient does have freezing episodes and requires min A to avoid loss of balance and improve rollator control during turns.  See above for scores:  Educated patient in strategies to reduce freezing: Recommend patient increased lateral weight shift when experiencing episode of freezing; Weaving around wide cones #4 with rollator x3 sets with min A for rollator management and mod VCs for sequencing and positioning; Required cues to stay close to rollator for better dynamic balance safety; Did have 1-2 episodes of freezing and instructed patient in lateral weight shifts  to help reduce freezing. Patient able to complete with verbal instruction; Educated spouse/caregiver in lateral weight shift so that  she will be able to remind him about weight shift when he experiences freezing at home.                   PT Education - 11/14/18 1335    Education provided  Yes    Education Details  balance/gait safety; freezing recovery;     Person(s) Educated  Patient;Spouse    Methods  Explanation;Demonstration;Verbal cues    Comprehension  Verbalized understanding;Verbal cues required;Returned demonstration;Need further instruction       PT Short Term Goals - 11/07/18 1454      PT SHORT TERM GOAL #1   Title  Patient will be adherent to HEP at least 3x a week to improve functional strength and balance for better safety at home.    Time  4    Period  Weeks    Status  New    Target Date  12/05/18      PT SHORT TERM GOAL #2   Title  Patient will deny any falls for 5 days to demonstrate improved safety awareness at home and work.     Time  4    Period  Weeks    Status  New    Target Date  12/05/18      PT SHORT TERM GOAL #3   Title  Patient will exhibits good hand placement with transfers at least 50% of the time to exhibit improved safety awarness and reduce falls    Time  4    Period  Weeks    Status  New    Target Date  12/05/18        PT Long Term Goals - 11/07/18 1455      PT LONG TERM GOAL #1   Title  Patient will exhibit improved safety awareness with gait with better rollator management, keeping walker in optimum position at least 50% of the time during turns.     Time  8    Period  Weeks    Status  New    Target Date  01/02/19      PT LONG TERM GOAL #2   Title  Patient will decrease TUG test to <30 seconds in order to achieve an independently mobile status and decrease his risk for falls    Time  8    Period  Weeks    Status  New    Target Date  01/02/19      PT LONG TERM GOAL #3   Title  Patient will increase overall  strength to 4+/5 in both UE and LE in order to complete transfers safely with decreased risk for falls.     Time  8    Period  Weeks    Status  New    Target Date  01/02/19            Plan - 11/14/18 1454    Clinical Impression Statement  Patient progressing slowly; Caregiver reports patient is approximately 50% compliant with rollator safety keeping rollator in front of him when turning. Patient instructed in [place] assessment to assess safety. He does test as a high fall risk with increased forward flexed posture and decreased safety with narrow base of support. Patient did have good cardiovascular response with 6 min walk test. Patient educated in strategies to reduce freezing especially with turns. He would benefit from additional skilled PT Intervention to improve strength, balance and gait safety;     Rehab Potential  Fair    Clinical  Impairments Affecting Rehab Potential  Positive factors: motivated, family support, previous good responses to PT; Negative factors: high co-pay, progressive disease; Clinical Presentation: Evolving-multiple falls    PT Frequency  2x / week    PT Duration  8 weeks    PT Treatment/Interventions  ADLs/Self Care Home Management;Aquatic Therapy;Electrical Stimulation;Moist Heat;DME Instruction;Gait training;Stair training;Functional mobility training;Therapeutic activities;Therapeutic exercise;Balance training;Neuromuscular re-education;Patient/family education;Manual techniques;Energy conservation    PT Next Visit Plan  work on Animal nutritionist;     [place]  continue as given;     Consulted and Agree with Plan of Care  Patient;Family member/caregiver       Patient will benefit from skilled therapeutic intervention in order to improve the following deficits and impairments:  Abnormal gait, Decreased balance, Decreased endurance, Decreased knowledge of use of DME, Decreased mobility, Decreased range of motion, Decreased safety  awareness, Decreased strength, Difficulty walking, Hypomobility, Increased fascial restricitons, Impaired perceived functional ability, Impaired flexibility, Improper body mechanics, Postural dysfunction  Visit Diagnosis: Muscle weakness (generalized)  Other lack of coordination  Unsteadiness on feet  Abnormal posture  Difficulty in walking, not elsewhere classified     Problem List Patient Active Problem List   Diagnosis Date Noted  . Dementia due to Parkinson's disease without behavioral disturbance ([place]) 07/18/2017  . Neurogenic orthostatic hypotension ([place]) 12/02/2016  . BPH with urinary obstruction 11/14/2016  . Urinary frequency 10/10/2016  . Microscopic hematuria 10/10/2016  . Parkinson's syndrome ([place]) 06/14/2016  . Arthritis 06/14/2016  . Depression 06/14/2016  . Heartburn 06/14/2016  . Urinary incontinence 06/14/2016  . Skin lesion 06/14/2016  . Dementia ([place]) 06/14/2016  . Bilateral edema of lower extremity 06/14/2016    Trotter,Margaret PT, DPT 11/14/2018, 2:57 PM  [place] MAIN [facility] SERVICES [address] [place], [place], [address] Phone: [phone]   Fax:  [phone]  Name: Merl Bommarito MRN: [id number] Date of Birth: [birth year]

## 2018-11-16 ENCOUNTER — Encounter: Payer: Medicare Other | Admitting: Occupational Therapy

## 2018-11-19 ENCOUNTER — Encounter: Payer: Medicare Other | Admitting: Occupational Therapy

## 2018-11-20 ENCOUNTER — Encounter: Payer: Medicare Other | Admitting: Occupational Therapy

## 2018-11-21 ENCOUNTER — Ambulatory Visit: Payer: Medicare Other | Admitting: Physical Therapy

## 2018-11-22 ENCOUNTER — Ambulatory Visit: Payer: Medicare Other | Admitting: Occupational Therapy

## 2018-11-22 DIAGNOSIS — R2681 Unsteadiness on feet: Secondary | ICD-10-CM

## 2018-11-22 DIAGNOSIS — M6281 Muscle weakness (generalized): Secondary | ICD-10-CM | POA: Diagnosis not present

## 2018-11-22 DIAGNOSIS — R278 Other lack of coordination: Secondary | ICD-10-CM

## 2018-11-22 DIAGNOSIS — R293 Abnormal posture: Secondary | ICD-10-CM

## 2018-11-23 ENCOUNTER — Encounter: Payer: Medicare Other | Admitting: Occupational Therapy

## 2018-11-23 ENCOUNTER — Encounter: Payer: Self-pay | Admitting: Occupational Therapy

## 2018-11-23 NOTE — Therapy (Signed)
[place] MAIN [facility] SERVICES [address] [place], [place], [address] Phone: [phone]   Fax:  [phone]  Occupational Therapy Treatment  Patient Details  Name: Luke Durham MRN: [id number] Date of Birth: [birth year] No data recorded  Encounter Date: 11/22/2018  OT End of Session - 11/23/18 1611    Visit Number  [id number]    Date for OT Re-Evaluation  01/11/19    Authorization Type  Medicare visit 4 of 10, reporting period started 07/20/2018    OT Start Time  1147    OT Stop Time  1245    OT Time Calculation (min)  58 min    Activity Tolerance  Patient tolerated treatment well    Behavior During Therapy  [facility] for tasks assessed/performed;Flat affect       Past Medical History:  Diagnosis Date  . Anxiety   . Arthritis   . Asthma    childhood asthma  . Cancer ([place]) 7 years ago   lymphoma, [place] (recent)  . Chronic kidney disease   . Colon polyps   . GERD (gastroesophageal reflux disease)   . History of kidney stones   . Parkinson disease [facility])     Past Surgical History:  Procedure Laterality Date  . COLON SURGERY    . CYSTOSCOPY WITH LITHOLAPAXY N/A 11/14/2016   Procedure: CYSTOSCOPY WITH LITHOLAPAXY;  Surgeon: Hollice Espy, MD;  Location: [facility] ORS;  Service: Urology;  Laterality: N/A;  . EYE SURGERY Bilateral    Cataract Extraction with IOL  . HERNIA REPAIR  20 years  . MOHS SURGERY    . SMALL INTESTINE SURGERY     Per patient 7 years  . TRANSURETHRAL RESECTION OF PROSTATE N/A 11/14/2016   Procedure: TRANSURETHRAL RESECTION OF THE PROSTATE (TURP);  Surgeon: Hollice Espy, MD;  Location: [facility] ORS;  Service: Urology;  Laterality: N/A;    There were no vitals filed for this visit.  Subjective Assessment - 11/23/18 1543    Subjective   Patient's wife reports her calendar has been mixed up, missed PT yesterday and today they came 1 hour early to OT appt.  discussed recent referral from neuro for LSVT BIG and LOUD however wife  states she cannot get patient to therapy 4 days a week.  They would like to have both PT and OT 2 times a week and on the same day.  She is upset that pt has PT appts set up but no OT for the next month.  Asking therapist to look at the [place] again.     Pertinent History  Patient reports he was diagnosed with Parkinson's disease about 10 plus years ago.  He reports a more recent decline in function in his daily activities in the last 6-12 months.  He has been seeing PT for the last couple months.    Patient Stated Goals  Patient reports he wants to be able to do more for himself and around the house.     Currently in Pain?  Yes    Pain Score  7     Pain Location  Groin    Pain Orientation  Right    Pain Descriptors / Indicators  Aching;Shooting    Pain Onset  More than a month ago    Pain Frequency  Intermittent       Neuromuscular reeducation:  Patient seen for instruction of LSVT BIG exercises: LSVT Daily Session Maximal Daily Exercises: Sustained movements are designed to rescale the amplitude of movement output for  generalization to daily functional activities. Performed as follows for 1 set of 10 repetitions each: Multi directional sustained movements- 1) Floor to ceiling, 2) Side to side. Multi directional Repetitive movements performed in standing and are designed to provide retraining effort needed for sustained muscle activation in tasks Performed as follows: 3) Step and reach forward, 4) Step and Reach Backwards, 5) Step and reach sideways, 6) Rock and reach forward/backward, 7) Rock and reach sideways.  Exercises completed from seated version in entirety with cues for proper form, therapist demo and guiding as needed.  In standing focused on stepping in all the directions with min assist for balance, patient with poor posture during all exercises in standing and requires verbal and tactile cues for upright posture. All exercises with focus on amplitude of movement with UE and LEs.     ADLs:  Crossed leg exercise bilaterally for ROM to reach to feet to manage socks and shoes, therapist demo and cues for technique.   Patient seen for simulated toilet transfers with emphasis on turns with use of rollator, verbal cues and occasional assist to keep rollator close to patient.                     OT Education - 11/23/18 1610    Education provided  Yes    Education Details  daily exercises based on LSVT BIG from the past    Person(s) Educated  Patient;Spouse    Methods  Demonstration;Explanation;Verbal cues;Tactile cues    Comprehension  Verbal cues required;Returned demonstration;Verbalized understanding;Tactile cues required          OT Long Term Goals - 11/10/18 1541      OT LONG TERM GOAL #1   Title  Patient will demonstrate increase right hand grip to be able to cut food with modified independence.     Baseline  occasional assist required.    Time  12    Period  Weeks    Status  On-going      OT LONG TERM GOAL #2   Title  Patient will complete donning long sleeve shirt with modified independence.    Baseline  requires assist with long sleeves, can perform short sleeves.    Time  12    Period  Weeks    Status  Achieved      OT LONG TERM GOAL #3   Title  Patient will improve coordination to manage buttons on clothing with occasional assistance only.    Baseline  requires assist with buttons each time    Time  12    Period  Weeks    Status  On-going      OT LONG TERM GOAL #4   Title  Patient will demonstrate upright posture with shoulders back and head up during exercises with minimal cues.      Baseline  moderate cues for positioning during exercises.     Time  12    Period  Weeks    Status  On-going      OT LONG TERM GOAL #5   Title  Patient will demonstrate turning behaviors with rollator using large steps and keeping feet behind the rollator with all turns with occasional minimal cues.     Time  12    Period  Weeks    Status   On-going      OT LONG TERM GOAL #6   Title  Patient will improve gait speed and endurance and be able to walk 950 feet  in 6 minutes to negotiate around the home and community safely in 12 weeks    Baseline  860 at reassessment, 161 at recert 0/96, 045 feet 4/0/9811    Time  4    Period  Weeks    Status  On-going      OT LONG TERM GOAL #7   Title  Patient will complete HEP for maximal daily exercises with modified independence in 4 weeks    Time  12    Period  Weeks    Status  On-going      OT LONG TERM GOAL #8   Title  Patient will transfer from sit to stand without the use of arms safely and independently from a variety of chairs/surfaces in 4 weeks.     Baseline  difficulty from lower surfaces    Time  4    Period  Weeks    Status  Achieved      OT LONG TERM GOAL  #9   Baseline  Patient will demonstrate floor transfers with use of chair to assist with min guard and cues if needed.     Time  12    Period  Weeks    Status  On-going            Plan - 11/23/18 1611    Clinical Impression Statement  Patient reports pain in his groin is worse and he forgot to talk to the doctor about it during his MD appt recently.  Increased pain with any movements with stepping to the right and with ABD of the right leg.  He denies pain when crossing right left over left.  He continues to require min assist for balance with tasks in standing.  He is familiar with his exercises especially the first 2 but any in standing he has difficulty recalling.  He has pictures at home and encouraged patient to refer to handouts as he is performing exercises at home with family.  Continue to work towards goals in plan of care to increase independence in daily tasks.      Occupational Profile and client history currently impacting functional performance  progressive disease process, freezing of gait behaviors, fall risk    Occupational performance deficits (Please refer to evaluation for details):   ADL's;IADL's;Leisure    Rehab Potential  Good    Current Impairments/barriers affecting progress:  positive: family support, negative: level of motivation, progressive disease    OT Frequency  2x / week    OT Duration  12 weeks    OT Treatment/Interventions  Self-care/ADL training;Moist Heat;DME and/or AE instruction;Balance training;Therapeutic activities;Therapeutic exercise;Neuromuscular education;Functional Mobility Training;Patient/family education    Consulted and Agree with Plan of Care  Patient       Patient will benefit from skilled therapeutic intervention in order to improve the following deficits and impairments:  Abnormal gait, Decreased coordination, Decreased range of motion, Difficulty walking, Decreased endurance, Decreased safety awareness, Decreased activity tolerance, Decreased balance, Impaired UE functional use, Pain, Decreased mobility, Decreased strength  Visit Diagnosis: Muscle weakness (generalized)  Other lack of coordination  Unsteadiness on feet  Abnormal posture    Problem List Patient Active Problem List   Diagnosis Date Noted  . Dementia due to Parkinson's disease without behavioral disturbance ([place]) 07/18/2017  . Neurogenic orthostatic hypotension ([place]) 12/02/2016  . BPH with urinary obstruction 11/14/2016  . Urinary frequency 10/10/2016  . Microscopic hematuria 10/10/2016  . Parkinson's syndrome ([place]) 06/14/2016  . Arthritis 06/14/2016  . Depression 06/14/2016  .  Heartburn 06/14/2016  . Urinary incontinence 06/14/2016  . Skin lesion 06/14/2016  . Dementia ([place]) 06/14/2016  . Bilateral edema of lower extremity 06/14/2016   Achilles Dunk, OTR/L, CLT  , 11/23/2018, 4:24 PM  [place] MAIN [facility] SERVICES [address] [place], [place], [address] Phone: [phone]   Fax:  [phone]  Name: Luke Durham MRN: [id number] Date of Birth: 05/19/1933

## 2018-11-23 NOTE — Therapy (Signed)
[place] MAIN [facility] SERVICES [address] [place], [place], [address] Phone: [phone]   Fax:  [phone]  Occupational Therapy Treatment  Patient Details  Name: Luke Durham MRN: [id number] Date of Birth: 01-24-1933 No data recorded  Encounter Date: 11/12/2018  OT End of Session - 11/23/18 1529    Visit Number  163    Number of Visits  180    Date for OT Re-Evaluation  01/11/19    Authorization Type  Medicare visit 3 of 10, reporting period started 07/20/2018    OT Start Time  1259    OT Stop Time  1345    OT Time Calculation (min)  46 min    Activity Tolerance  Patient tolerated treatment well    Behavior During Therapy  [facility] for tasks assessed/performed;Flat affect       Past Medical History:  Diagnosis Date  . Anxiety   . Arthritis   . Asthma    childhood asthma  . Cancer ([place]) 7 years ago   lymphoma, [place] (recent)  . Chronic kidney disease   . Colon polyps   . GERD (gastroesophageal reflux disease)   . History of kidney stones   . Parkinson disease [facility])     Past Surgical History:  Procedure Laterality Date  . COLON SURGERY    . CYSTOSCOPY WITH LITHOLAPAXY N/A 11/14/2016   Procedure: CYSTOSCOPY WITH LITHOLAPAXY;  Surgeon: Hollice Espy, MD;  Location: [facility] ORS;  Service: Urology;  Laterality: N/A;  . EYE SURGERY Bilateral    Cataract Extraction with IOL  . HERNIA REPAIR  20 years  . MOHS SURGERY    . SMALL INTESTINE SURGERY     Per patient 7 years  . TRANSURETHRAL RESECTION OF PROSTATE N/A 11/14/2016   Procedure: TRANSURETHRAL RESECTION OF THE PROSTATE (TURP);  Surgeon: Hollice Espy, MD;  Location: [facility] ORS;  Service: Urology;  Laterality: N/A;    There were no vitals filed for this visit.  Subjective Assessment - 11/23/18 1526    Subjective   Patient reports he had a good weekend, no falls.     Pertinent History  Patient reports he was diagnosed with Parkinson's disease about 10 plus years ago.  He reports a  more recent decline in function in his daily activities in the last 6-12 months.  He has been seeing PT for the last couple months.    Patient Stated Goals  Patient reports he wants to be able to do more for himself and around the house.     Currently in Pain?  Yes    Pain Score  4     Pain Location  Groin    Pain Orientation  Right    Pain Descriptors / Indicators  Aching    Pain Onset  More than a month ago    Pain Frequency  Intermittent        Neuromuscular reeducation:    Patient seen this date for seated exercises for reaching floor to ceiling with 10 secs hold for 10 reps,  Side to side reaching with leg in extension for 10 sec hold for 10 reps.   Left foot side step, did not perform right due to groin pain.   Right and left forward step in sitting, backward step.   All exercises require therapist demo and cues for technique.    Standing exercises, with stepping in all directions with min assist to min guard, cues for amplitude of step, verbal and tactile cues for posture.  Patient has demonstrated progressively poor posture over the last few weeks which has impacted his balance.    Posture exercises on the wall for upright shoulders, cues for shoulder retraction and head position with 30 sec hold and repeated 10 times.   Reaching tasks in sitting and standing with cues for body position in relation to object he is reaching for, focus on amplitude of movements with arms during reach                       OT Education - 11/23/18 1529    Education provided  Yes    Education Details  safety with transfers and exercises    Person(s) Educated  Patient;Spouse    Methods  Demonstration;Explanation;Verbal cues;Tactile cues    Comprehension  Verbal cues required;Returned demonstration;Verbalized understanding;Tactile cues required          OT Long Term Goals - 11/10/18 1541      OT LONG TERM GOAL #1   Title  Patient will demonstrate increase right hand  grip to be able to cut food with modified independence.     Baseline  occasional assist required.    Time  12    Period  Weeks    Status  On-going      OT LONG TERM GOAL #2   Title  Patient will complete donning long sleeve shirt with modified independence.    Baseline  requires assist with long sleeves, can perform short sleeves.    Time  12    Period  Weeks    Status  Achieved      OT LONG TERM GOAL #3   Title  Patient will improve coordination to manage buttons on clothing with occasional assistance only.    Baseline  requires assist with buttons each time    Time  12    Period  Weeks    Status  On-going      OT LONG TERM GOAL #4   Title  Patient will demonstrate upright posture with shoulders back and head up during exercises with minimal cues.      Baseline  moderate cues for positioning during exercises.     Time  12    Period  Weeks    Status  On-going      OT LONG TERM GOAL #5   Title  Patient will demonstrate turning behaviors with rollator using large steps and keeping feet behind the rollator with all turns with occasional minimal cues.     Time  12    Period  Weeks    Status  On-going      OT LONG TERM GOAL #6   Title  Patient will improve gait speed and endurance and be able to walk 950 feet in 6 minutes to negotiate around the home and community safely in 12 weeks    Baseline  860 at reassessment, 409 at recert 8/11, 914 feet 05/01/2955    Time  4    Period  Weeks    Status  On-going      OT LONG TERM GOAL #7   Title  Patient will complete HEP for maximal daily exercises with modified independence in 4 weeks    Time  12    Period  Weeks    Status  On-going      OT LONG TERM GOAL #8   Title  Patient will transfer from sit to stand without the use of arms safely and independently from a  variety of chairs/surfaces in 4 weeks.     Baseline  difficulty from lower surfaces    Time  4    Period  Weeks    Status  Achieved      OT LONG TERM GOAL  #9   Baseline   Patient will demonstrate floor transfers with use of chair to assist with min guard and cues if needed.     Time  12    Period  Weeks    Status  On-going            Plan - 11/23/18 1530    Clinical Impression Statement  Patient still limited by pain in his groin on the right side and has difficulty with stepping to the right or moving the leg in ABD.  Patient responds well to cues but has difficulty with carryover from session to session with tasks.  He has restarted PT and has not had a fall in the last week.  Continue to work towards goals in [place] to increase independence and safety with daily tasks.     Occupational Profile and client history currently impacting functional performance  progressive disease process, freezing of gait behaviors, fall risk    Occupational performance deficits (Please refer to evaluation for details):  ADL's;IADL's;Leisure    Rehab Potential  Good    Current Impairments/barriers affecting progress:  positive: family support, negative: level of motivation, progressive disease    OT Frequency  2x / week    OT Duration  12 weeks    OT Treatment/Interventions  Self-care/ADL training;Moist Heat;DME and/or AE instruction;Balance training;Therapeutic activities;Therapeutic exercise;Neuromuscular education;Functional Mobility Training;Patient/family education    Consulted and Agree with Plan of Care  Patient       Patient will benefit from skilled therapeutic intervention in order to improve the following deficits and impairments:  Abnormal gait, Decreased coordination, Decreased range of motion, Difficulty walking, Decreased endurance, Decreased safety awareness, Decreased activity tolerance, Decreased balance, Impaired UE functional use, Pain, Decreased mobility, Decreased strength  Visit Diagnosis: Muscle weakness (generalized)  Other lack of coordination  Unsteadiness on feet  Abnormal posture    Problem List Patient Active Problem List   Diagnosis  Date Noted  . Dementia due to Parkinson's disease without behavioral disturbance ([place]) 07/18/2017  . Neurogenic orthostatic hypotension ([place]) 12/02/2016  . BPH with urinary obstruction 11/14/2016  . Urinary frequency 10/10/2016  . Microscopic hematuria 10/10/2016  . Parkinson's syndrome ([place]) 06/14/2016  . Arthritis 06/14/2016  . Depression 06/14/2016  . Heartburn 06/14/2016  . Urinary incontinence 06/14/2016  . Skin lesion 06/14/2016  . Dementia ([place]) 06/14/2016  . Bilateral edema of lower extremity 06/14/2016   Achilles Dunk, OTR/L, CLT  Lovett,Amy 11/23/2018, 3:41 PM  [place] MAIN [facility] SERVICES [address] [place], [place], [address] Phone: [phone]   Fax:  [phone]  Name: Luke Durham MRN: [id number] Date of Birth: 26-Mar-1933

## 2018-11-26 ENCOUNTER — Encounter: Payer: Medicare Other | Admitting: Occupational Therapy

## 2018-11-27 ENCOUNTER — Encounter: Payer: Self-pay | Admitting: Physical Therapy

## 2018-11-27 ENCOUNTER — Ambulatory Visit: Payer: Medicare Other | Attending: Physician Assistant | Admitting: Physical Therapy

## 2018-11-27 VITALS — BP 114/66 | HR 58

## 2018-11-27 DIAGNOSIS — R2681 Unsteadiness on feet: Secondary | ICD-10-CM | POA: Diagnosis present

## 2018-11-27 DIAGNOSIS — R262 Difficulty in walking, not elsewhere classified: Secondary | ICD-10-CM | POA: Diagnosis present

## 2018-11-27 DIAGNOSIS — R293 Abnormal posture: Secondary | ICD-10-CM | POA: Diagnosis present

## 2018-11-27 DIAGNOSIS — R278 Other lack of coordination: Secondary | ICD-10-CM | POA: Insufficient documentation

## 2018-11-27 DIAGNOSIS — M6281 Muscle weakness (generalized): Secondary | ICD-10-CM | POA: Diagnosis not present

## 2018-11-27 NOTE — Therapy (Signed)
[place] MAIN [facility] SERVICES [address] [place], [place], [address] Phone: [phone]   Fax:  [phone]  Physical Therapy Treatment  Patient Details  Name: Luke Durham MRN: [id number] Date of Birth: [birth year] Referring Provider (PT): Dr. Manuella Ghazi   Encounter Date: 11/27/2018  PT End of Session - 11/27/18 1214    Visit Number  3    Number of Visits  17    Date for PT Re-Evaluation  01/02/19    Authorization Type  eval on 11/07/18    PT Start Time  1148    PT Stop Time  1230    PT Time Calculation (min)  42 min    Equipment Utilized During Treatment  Gait belt    Activity Tolerance  Patient limited by fatigue;Patient tolerated treatment well    Behavior During Therapy  [facility] for tasks assessed/performed;Flat affect       Past Medical History:  Diagnosis Date  . Anxiety   . Arthritis   . Asthma    childhood asthma  . Cancer ([place]) 7 years ago   lymphoma, [place] (recent)  . Chronic kidney disease   . Colon polyps   . GERD (gastroesophageal reflux disease)   . History of kidney stones   . Parkinson disease [facility])     Past Surgical History:  Procedure Laterality Date  . COLON SURGERY    . CYSTOSCOPY WITH LITHOLAPAXY N/A 11/14/2016   Procedure: CYSTOSCOPY WITH LITHOLAPAXY;  Surgeon: Hollice Espy, MD;  Location: [facility] ORS;  Service: Urology;  Laterality: N/A;  . EYE SURGERY Bilateral    Cataract Extraction with IOL  . HERNIA REPAIR  20 years  . MOHS SURGERY    . SMALL INTESTINE SURGERY     Per patient 7 years  . TRANSURETHRAL RESECTION OF PROSTATE N/A 11/14/2016   Procedure: TRANSURETHRAL RESECTION OF THE PROSTATE (TURP);  Surgeon: Hollice Espy, MD;  Location: [facility] ORS;  Service: Urology;  Laterality: N/A;    Vitals:   11/27/18 1157  BP: 114/66  Pulse: (!) 58  SpO2: 100%    Subjective Assessment - 11/27/18 1156    Subjective  Caregiver reports 1 fall in last week. Patient reports no falls; Reports doing slightly better  with rollator management, staying behind the walker approximately 50% of time with turns.     Pertinent History  85 yo Male diagnosed with parkinson's disease presents to PT with worsening gait, balance. He reports increased episodes of freezing and has a follow up appointment with Dr. Manuella Ghazi to assess medication and overall disease progression. Patient has been through [place] and reports mixed adherence to HEP. He reports, "I don't do the exercises as much as I should because of laziness and they aren't much fun." He reports falling every few days (8 falls in last week) which occurs mostly during walking; Reports no change in memory; Does exhibit increased flexed posture;     How long can you sit comfortably?  N/A    How long can you stand comfortably?  10 min with forward flexed posture    How long can you walk comfortably?  level surface with rollator, limited to <200 feet;     Patient Stated Goals  reduce falls, improve flexibility; "Be safer walking."     Currently in Pain?  Yes    Pain Score  5     Pain Location  Groin    Pain Orientation  Right    Pain Descriptors / Indicators  [place]  Pain Type  Chronic pain    Pain Onset  More than a month ago    Pain Frequency  Intermittent    Aggravating Factors   hard to say    Pain Relieving Factors  rest    Effect of Pain on Daily Activities  decreased activity level;     Multiple Pain Sites  No         TREATMENT: PT assessed vitals, see above;  NMR:  Patient instructed in advanced balance exercise  Standing in parallel bars:  Standing on airex foam: -alternate toe taps to 4 inch step with 2-1 rail assist x15 reps bilaterally; -Standing one foot on airex, one foot on 4 inch step, BUE ball pass side/side x5 reps each foot on step -Heel/toe raises x15 reps with rail assist for balance -modified tandem stance: head turns side/side, up/down x5 reps each foot in front; -feet together, eyes open/closed 10 sec hold x3 reps with cues for  erect posture and to improve weight shift for better stance control;  Patient required min VCs for balance stability, including to increase trunk control for less loss of balance with smaller base of support   Exercise: PT instructed patient in advanced HEP: Standing with red tband around BLE: -hip abduction x15 bilaterally; Patient required mod VCs for safe don/doff tband and cues for sequencing for standing with rollator for safety. He expressed desire to hold onto rollator so that he can do these exercises himself without assistance from his wife. Patient does require increased time to don tband due to incoordination in UE and weakness. He was able to don tband safely with increased time.  Patient reports moderate fatigue at end of session. He would benefit from additional skilled intervention to review safety with standing strengthening exercise as part of HEP;                       PT Education - 11/27/18 1214    Education provided  Yes    Education Details  balance/gait safety; HEP reinforced;     Person(s) Educated  Patient;Spouse    Methods  Explanation;Verbal cues    Comprehension  Verbalized understanding;Returned demonstration;Verbal cues required;Need further instruction       PT Short Term Goals - 11/07/18 1454      PT SHORT TERM GOAL #1   Title  Patient will be adherent to HEP at least 3x a week to improve functional strength and balance for better safety at home.    Time  4    Period  Weeks    Status  New    Target Date  12/05/18      PT SHORT TERM GOAL #2   Title  Patient will deny any falls for 5 days to demonstrate improved safety awareness at home and work.     Time  4    Period  Weeks    Status  New    Target Date  12/05/18      PT SHORT TERM GOAL #3   Title  Patient will exhibits good hand placement with transfers at least 50% of the time to exhibit improved safety awarness and reduce falls    Time  4    Period  Weeks    Status  New     Target Date  12/05/18        PT Long Term Goals - 11/07/18 1455      PT LONG TERM GOAL #1   Title  Patient will exhibit improved safety awareness with gait with better rollator management, keeping walker in optimum position at least 50% of the time during turns.     Time  8    Period  Weeks    Status  New    Target Date  01/02/19      PT LONG TERM GOAL #2   Title  Patient will decrease TUG test to <30 seconds in order to achieve an independently mobile status and decrease his risk for falls    Time  8    Period  Weeks    Status  New    Target Date  01/02/19      PT LONG TERM GOAL #3   Title  Patient will increase overall strength to 4+/5 in both UE and LE in order to complete transfers safely with decreased risk for falls.     Time  8    Period  Weeks    Status  New    Target Date  01/02/19            Plan - 11/28/18 1051    Clinical Impression Statement  Patient tolerated session fair. He does require extensive cues for safety awareness and walker placement. Patient did seem to have improved static standing balance; He does require increased cues for erect posture as he often has forward flexed position with unsupported standing. Instructed patient in advanced LE strengthening exercise as part of HEP; He did have difficulty with advanced LE strengthening particularly with safety and sequencing for don/doff tband. patient would benefit from additional skilled PT Intervention to improve strength, balance and gait safety;     Rehab Potential  Fair    Clinical Impairments Affecting Rehab Potential  Positive factors: motivated, family support, previous good responses to PT; Negative factors: high co-pay, progressive disease; Clinical Presentation: Evolving-multiple falls    PT Frequency  2x / week    PT Duration  8 weeks    PT Treatment/Interventions  ADLs/Self Care Home Management;Aquatic Therapy;Electrical Stimulation;Moist Heat;DME Instruction;Gait training;Stair  training;Functional mobility training;Therapeutic activities;Therapeutic exercise;Balance training;Neuromuscular re-education;Patient/family education;Manual techniques;Energy conservation    PT Next Visit Plan  work on Animal nutritionist;     [place]  continue as given;     Consulted and Agree with Plan of Care  Patient;Family member/caregiver       Patient will benefit from skilled therapeutic intervention in order to improve the following deficits and impairments:  Abnormal gait, Decreased balance, Decreased endurance, Decreased knowledge of use of DME, Decreased mobility, Decreased range of motion, Decreased safety awareness, Decreased strength, Difficulty walking, Hypomobility, Increased fascial restricitons, Impaired perceived functional ability, Impaired flexibility, Improper body mechanics, Postural dysfunction  Visit Diagnosis: Muscle weakness (generalized)  Other lack of coordination  Unsteadiness on feet  Abnormal posture  Difficulty in walking, not elsewhere classified     Problem List Patient Active Problem List   Diagnosis Date Noted  . Dementia due to Parkinson's disease without behavioral disturbance ([place]) 07/18/2017  . Neurogenic orthostatic hypotension ([place]) 12/02/2016  . BPH with urinary obstruction 11/14/2016  . Urinary frequency 10/10/2016  . Microscopic hematuria 10/10/2016  . Parkinson's syndrome ([place]) 06/14/2016  . Arthritis 06/14/2016  . Depression 06/14/2016  . Heartburn 06/14/2016  . Urinary incontinence 06/14/2016  . Skin lesion 06/14/2016  . Dementia ([place]) 06/14/2016  . Bilateral edema of lower extremity 06/14/2016    , PT, DPT 11/28/2018, 11:04 AM  [place] MAIN [facility] SERVICES [place]  [place], [place], [address] Phone: [phone]   Fax:  [phone]  Name: Luke Durham MRN: [id number] Date of Birth: Feb 28, 1933

## 2018-11-27 NOTE — Patient Instructions (Addendum)
Balance, Proprioception: Hip Abduction With Tubing   With tubing attached to both ankles, Standing holding onto counter, kick one leg out to side and then Return.  Repeat _15___ times  On each side.  Do ___2_ sessions per day.  http://cc.[email or web address].[email or web address]   .  Balance, Proprioception: Hip Extension With Tubing   With tubing tied around both legs, holding onto kitchen counter, swing leg back. Return. Repeat _15___ times . Do __2__ sessions per day.  [email or web address]://cc.[email or web address].[email or web address]   Copyright  VHI. All rights reserved.  Balance, Proprioception: Hip Flexion With Tubing   With tubing attached to both ankles, swing leg forward. Return. Repeat _15___ times. Do __2__ sessions per day.  http://cc.[email or web address].[email or web address]   Copyright  VHI. All rights reserved.  Band Walk: Side Stepping   Tie band around legs, AROUND ANKLES. Step _10__ feet to one side, then step back to start. Repeat _2-3__ feet per session. Note: Small towel between band and skin eases rubbing.  [email or web address].[email or web address].[email or web address]   Toe / Heel Raise (Standing)    Standing with support, raise heels, then rock back on heels and raise toes. Repeat __15__ times.  Copyright  VHI. All rights reserved.

## 2018-11-29 ENCOUNTER — Ambulatory Visit: Payer: Medicare Other | Admitting: Physical Therapy

## 2018-11-30 ENCOUNTER — Encounter: Payer: Medicare Other | Admitting: Occupational Therapy

## 2018-12-03 ENCOUNTER — Encounter: Payer: Medicare Other | Admitting: Occupational Therapy

## 2018-12-04 ENCOUNTER — Encounter: Payer: Self-pay | Admitting: Physical Therapy

## 2018-12-04 ENCOUNTER — Ambulatory Visit: Payer: Medicare Other | Admitting: Physical Therapy

## 2018-12-04 DIAGNOSIS — M6281 Muscle weakness (generalized): Secondary | ICD-10-CM

## 2018-12-04 DIAGNOSIS — R293 Abnormal posture: Secondary | ICD-10-CM

## 2018-12-04 DIAGNOSIS — R2681 Unsteadiness on feet: Secondary | ICD-10-CM

## 2018-12-04 DIAGNOSIS — R278 Other lack of coordination: Secondary | ICD-10-CM

## 2018-12-04 DIAGNOSIS — R262 Difficulty in walking, not elsewhere classified: Secondary | ICD-10-CM

## 2018-12-04 NOTE — Therapy (Signed)
[place] MAIN [facility] SERVICES [address] [place], [place], [address] Phone: [phone]   Fax:  [phone]  Physical Therapy Treatment  Patient Details  Name: Luke Durham MRN: [id number] Date of Birth: 1933-04-10 Referring Provider (PT): Dr. Manuella Ghazi   Encounter Date: 12/04/2018  PT End of Session - 12/04/18 1310    Visit Number  4    Number of Visits  17    Date for PT Re-Evaluation  01/02/19    Authorization Type  eval on 11/07/18    PT Start Time  1120    PT Stop Time  1202    PT Time Calculation (min)  42 min    Equipment Utilized During Treatment  Gait belt    Activity Tolerance  Patient limited by fatigue;Patient tolerated treatment well    Behavior During Therapy  [facility] for tasks assessed/performed;Flat affect       Past Medical History:  Diagnosis Date  . Anxiety   . Arthritis   . Asthma    childhood asthma  . Cancer ([place]) 7 years ago   lymphoma, [place] (recent)  . Chronic kidney disease   . Colon polyps   . GERD (gastroesophageal reflux disease)   . History of kidney stones   . Parkinson disease [facility])     Past Surgical History:  Procedure Laterality Date  . COLON SURGERY    . CYSTOSCOPY WITH LITHOLAPAXY N/A 11/14/2016   Procedure: CYSTOSCOPY WITH LITHOLAPAXY;  Surgeon: Hollice Espy, MD;  Location: [facility] ORS;  Service: Urology;  Laterality: N/A;  . EYE SURGERY Bilateral    Cataract Extraction with IOL  . HERNIA REPAIR  20 years  . MOHS SURGERY    . SMALL INTESTINE SURGERY     Per patient 7 years  . TRANSURETHRAL RESECTION OF PROSTATE N/A 11/14/2016   Procedure: TRANSURETHRAL RESECTION OF THE PROSTATE (TURP);  Surgeon: Hollice Espy, MD;  Location: [facility] ORS;  Service: Urology;  Laterality: N/A;    There were no vitals filed for this visit.  Subjective Assessment - 12/04/18 1130    Subjective  Patient reports that his doctor recommended that he take more sinemet for freezing. After he took the medication and it  just knocked him out. He said that he couldnt' stay awake. He reports that he spent the day sleeping. Patient reports trying the exercise at home but that he couldnt' do it becuase he was so sleepy. He reports falling yesterday. He reports that he was walking backwards and tripped over his feet. He reports no injury.    Pertinent History  85 yo Male diagnosed with parkinson's disease presents to PT with worsening gait, balance. He reports increased episodes of freezing and has a follow up appointment with Dr. Manuella Ghazi to assess medication and overall disease progression. Patient has been through [facility] and reports mixed adherence to HEP. He reports, "I don't do the exercises as much as I should because of laziness and they aren't much fun." He reports falling every few days (8 falls in last week) which occurs mostly during walking; Reports no change in memory; Does exhibit increased flexed posture;     How long can you sit comfortably?  N/A    How long can you stand comfortably?  10 min with forward flexed posture    How long can you walk comfortably?  level surface with rollator, limited to <200 feet;     Patient Stated Goals  reduce falls, improve flexibility; "Be safer walking."  Currently in Pain?  Yes    Pain Score  4     Pain Location  Knee    Pain Orientation  Right    Pain Descriptors / Indicators  Tender    Pain Type  Acute pain    Pain Onset  Yesterday    Pain Frequency  Intermittent    Aggravating Factors   tender to touch    Pain Relieving Factors  rest    Effect of Pain on Daily Activities  decreased activity tolerance    Multiple Pain Sites  No          TREATMENT: Exercise: PT reinforced HEP: Patient able to don red tband with mod VCs for positioning and band placement. He required increased time due to increased difficulty but was able to don band without physical assistance; Standing with red tband around BLE ankles: -hip abduction x10 reps -hip extension x10 reps -hip  flexion x10 reps  All bilaterally; Patient required min-moderate verbal/tactile cues for correct exercise technique including cues to avoid trunk lean for better hip strengthening; Also required cues to keep stance leg straight for better quad control;  Advanced HEP: Standing heel/toe raises x15 reps Hip flexion march x10 bilaterally; Mini squat x10 reps  Utilized rollator for balance control; Required min VCs to increase AROM to tolerance for better strengthening;  NMR: PT instructed patient in dynamic balance exercise: Stepping over orange hurdle: -forward/backward with 2-0 rail assist x10 reps with minA for safety and mod VCs for foot placement, increase hip flexion for better foot clearance and to improve erect posture; He was hesitant to complete without rail assist but was able to do this with min A; -side step over orange hurdle x5 reps each direction with CGA and cues to increase erect posture and increase step length for better foot clearance; Able to complete 2-0 rail assist;  Patient tolerated session fair. He did report increased fatigue at end of session. Required increased time for transfers with min VCs for hand and walker placement for safety awareness;                      PT Education - 12/04/18 1310    Education provided  Yes    Education Details  balance/gait safety; LE strengthening; HEP    Person(s) Educated  Patient;Spouse    Methods  Explanation;Verbal cues;Handout    Comprehension  Verbalized understanding;Returned demonstration;Verbal cues required;Need further instruction       PT Short Term Goals - 11/07/18 1454      PT SHORT TERM GOAL #1   Title  Patient will be adherent to HEP at least 3x a week to improve functional strength and balance for better safety at home.    Time  4    Period  Weeks    Status  New    Target Date  12/05/18      PT SHORT TERM GOAL #2   Title  Patient will deny any falls for 5 days to demonstrate improved  safety awareness at home and work.     Time  4    Period  Weeks    Status  New    Target Date  12/05/18      PT SHORT TERM GOAL #3   Title  Patient will exhibits good hand placement with transfers at least 50% of the time to exhibit improved safety awarness and reduce falls    Time  4    Period  Weeks  Status  New    Target Date  12/05/18        PT Long Term Goals - 11/07/18 1455      PT LONG TERM GOAL #1   Title  Patient will exhibit improved safety awareness with gait with better rollator management, keeping walker in optimum position at least 50% of the time during turns.     Time  8    Period  Weeks    Status  New    Target Date  01/02/19      PT LONG TERM GOAL #2   Title  Patient will decrease TUG test to <30 seconds in order to achieve an independently mobile status and decrease his risk for falls    Time  8    Period  Weeks    Status  New    Target Date  01/02/19      PT LONG TERM GOAL #3   Title  Patient will increase overall strength to 4+/5 in both UE and LE in order to complete transfers safely with decreased risk for falls.     Time  8    Period  Weeks    Status  New    Target Date  01/02/19            Plan - 12/04/18 1311    Clinical Impression Statement  Patient progressing slowly. He requires frequent verbal cues for safety awareness and walker positioning. Patient often has forward flexed posture having difficulty achieving erect posture despite frequent cues. Patient tolerated advanced exercise well without increase in pain or discomfort. He would benefit from additional skilled PT Intervention to improve strength, balance and gait safety;     Rehab Potential  Fair    Clinical Impairments Affecting Rehab Potential  Positive factors: motivated, family support, previous good responses to PT; Negative factors: high co-pay, progressive disease; Clinical Presentation: Evolving-multiple falls    PT Frequency  2x / week    PT Duration  8 weeks    PT  Treatment/Interventions  ADLs/Self Care Home Management;Aquatic Therapy;Electrical Stimulation;Moist Heat;DME Instruction;Gait training;Stair training;Functional mobility training;Therapeutic activities;Therapeutic exercise;Balance training;Neuromuscular re-education;Patient/family education;Manual techniques;Energy conservation    PT Next Visit Plan  work on Animal nutritionist;     [place]  continue as given;     Consulted and Agree with Plan of Care  Patient;Family member/caregiver       Patient will benefit from skilled therapeutic intervention in order to improve the following deficits and impairments:  Abnormal gait, Decreased balance, Decreased endurance, Decreased knowledge of use of DME, Decreased mobility, Decreased range of motion, Decreased safety awareness, Decreased strength, Difficulty walking, Hypomobility, Increased fascial restricitons, Impaired perceived functional ability, Impaired flexibility, Improper body mechanics, Postural dysfunction  Visit Diagnosis: Muscle weakness (generalized)  Other lack of coordination  Unsteadiness on feet  Abnormal posture  Difficulty in walking, not elsewhere classified     Problem List Patient Active Problem List   Diagnosis Date Noted  . Dementia due to Parkinson's disease without behavioral disturbance ([place]) 07/18/2017  . Neurogenic orthostatic hypotension ([place]) 12/02/2016  . BPH with urinary obstruction 11/14/2016  . Urinary frequency 10/10/2016  . Microscopic hematuria 10/10/2016  . Parkinson's syndrome ([place]) 06/14/2016  . Arthritis 06/14/2016  . Depression 06/14/2016  . Heartburn 06/14/2016  . Urinary incontinence 06/14/2016  . Skin lesion 06/14/2016  . Dementia ([place]) 06/14/2016  . Bilateral edema of lower extremity 06/14/2016    Trotter,MargaretPT, DPT 12/04/2018, 1:12 PM  [place]  [place] MAIN [facility] SERVICES [place], [place], [address] Phone: [phone]    Fax:  [phone]  Name: Kalden Wanke MRN: [id number] Date of Birth: [birth year]

## 2018-12-04 NOTE — Patient Instructions (Signed)
Mini Squat with Counter Support   reps: 10 sets: 2   daily: 1 weekly: 7  Standing March with Counter Support   reps: 10 sets: 2   daily: 1 weekly: 7

## 2018-12-06 ENCOUNTER — Encounter: Payer: Self-pay | Admitting: Physical Therapy

## 2018-12-06 ENCOUNTER — Other Ambulatory Visit: Payer: Self-pay | Admitting: Physician Assistant

## 2018-12-06 ENCOUNTER — Ambulatory Visit: Payer: Medicare Other | Admitting: Physical Therapy

## 2018-12-06 DIAGNOSIS — F32A Depression, unspecified: Secondary | ICD-10-CM

## 2018-12-06 DIAGNOSIS — R293 Abnormal posture: Secondary | ICD-10-CM

## 2018-12-06 DIAGNOSIS — M6281 Muscle weakness (generalized): Secondary | ICD-10-CM | POA: Diagnosis not present

## 2018-12-06 DIAGNOSIS — R278 Other lack of coordination: Secondary | ICD-10-CM

## 2018-12-06 DIAGNOSIS — R2681 Unsteadiness on feet: Secondary | ICD-10-CM

## 2018-12-06 DIAGNOSIS — R262 Difficulty in walking, not elsewhere classified: Secondary | ICD-10-CM

## 2018-12-06 DIAGNOSIS — F329 Major depressive disorder, single episode, unspecified: Secondary | ICD-10-CM

## 2018-12-06 NOTE — Therapy (Signed)
[place] MAIN [facility] SERVICES [address] [place], [place], [address] Phone: [phone]   Fax:  [phone]  Physical Therapy Treatment  Patient Details  Name: Luke Durham MRN: [id number] Date of Birth: October 31, 1932 Referring Provider (PT): Dr. Manuella Ghazi   Encounter Date: 12/06/2018  PT End of Session - 12/06/18 1159    Visit Number  5    Number of Visits  17    Date for PT Re-Evaluation  01/02/19    Authorization Type  eval on 11/07/18    PT Start Time  1148    PT Stop Time  1230    PT Time Calculation (min)  42 min    Equipment Utilized During Treatment  Gait belt    Activity Tolerance  Patient limited by fatigue;Patient tolerated treatment well    Behavior During Therapy  [facility] for tasks assessed/performed;Flat affect       Past Medical History:  Diagnosis Date  . Anxiety   . Arthritis   . Asthma    childhood asthma  . Cancer ([place]) 7 years ago   lymphoma, [place] (recent)  . Chronic kidney disease   . Colon polyps   . GERD (gastroesophageal reflux disease)   . History of kidney stones   . Parkinson disease [facility])     Past Surgical History:  Procedure Laterality Date  . COLON SURGERY    . CYSTOSCOPY WITH LITHOLAPAXY N/A 11/14/2016   Procedure: CYSTOSCOPY WITH LITHOLAPAXY;  Surgeon: Hollice Espy, MD;  Location: [facility] ORS;  Service: Urology;  Laterality: N/A;  . EYE SURGERY Bilateral    Cataract Extraction with IOL  . HERNIA REPAIR  20 years  . MOHS SURGERY    . SMALL INTESTINE SURGERY     Per patient 7 years  . TRANSURETHRAL RESECTION OF PROSTATE N/A 11/14/2016   Procedure: TRANSURETHRAL RESECTION OF THE PROSTATE (TURP);  Surgeon: Hollice Espy, MD;  Location: [facility] ORS;  Service: Urology;  Laterality: N/A;    There were no vitals filed for this visit.  Subjective Assessment - 12/06/18 1157    Subjective  Patient reports doing well. He reports feeling some better than last session however is having some groin pain today which  is likely related to wet weather. He denies any new falls.     Pertinent History  85 yo Male diagnosed with parkinson's disease presents to PT with worsening gait, balance. He reports increased episodes of freezing and has a follow up appointment with Dr. Manuella Ghazi to assess medication and overall disease progression. Patient has been through [place] [place] and reports mixed adherence to HEP. He reports, "I don't do the exercises as much as I should because of laziness and they aren't much fun." He reports falling every few days (8 falls in last week) which occurs mostly during walking; Reports no change in memory; Does exhibit increased flexed posture;     How long can you sit comfortably?  N/A    How long can you stand comfortably?  10 min with forward flexed posture    How long can you walk comfortably?  level surface with rollator, limited to <200 feet;     Patient Stated Goals  reduce falls, improve flexibility; "Be safer walking."     Currently in Pain?  Yes    Pain Score  8     Pain Location  Groin    Pain Orientation  Right    Pain Descriptors / Indicators  Tender    Pain Type  Acute  pain    Pain Onset  More than a month ago    Pain Frequency  Intermittent    Aggravating Factors   tender to touch/lifting right leg to cross legs    Pain Relieving Factors  rest    Effect of Pain on Daily Activities  decreased activity tolerance;     Multiple Pain Sites  No            TREATMENT: Warm up on Nustep BLE only level 2 x4 min (Unbilled) concurrent with moist heat to groin;    NMR: PT instructed patient in dynamic balance exercise:  Standing in parallel bars:  Standing on airex foam: -alternate toe taps to 4 inch step with 0 rail assist 2x10 reps bilaterally; -Standing one foot on airex, one foot on 4 inch step, BUE wand flexion x5 reps each foot on step -Patient required min VCs for balance stability, including to increase trunk control for less loss of balance with smaller base of support;  He also required mod VCs to improve erect posture as when he is unsupported often leans into heavy forward flexion which impairs standing balance and reduces foot clearance, especially with toe taps;  Outside bars: Weaving around cones #5 x2 laps with min A for safety with rollator; required max VCs for sequencing and walker positioning. Patient heavily confused today having difficulty understanding appropriate walker positioning and sequencing. He required increased time with heavy verbal and tactile cues for correct rollator position especially with turning;  Patient tolerated session fair. He did report increased fatigue at end of session. Required increased time for transfers with min VCs for hand and walker placement for safety awareness; Pt more confused today requiring increased time and shorter more specific cues for understanding task and safety awareness with rollator;                     PT Education - 12/06/18 1159    Education provided  Yes    Education Details  LE strengthening/balance/gait safety    Person(s) Educated  Patient;Spouse    Methods  Explanation;Demonstration;Verbal cues    Comprehension  Verbalized understanding;Returned demonstration;Verbal cues required;Need further instruction       PT Short Term Goals - 11/07/18 1454      PT SHORT TERM GOAL #1   Title  Patient will be adherent to HEP at least 3x a week to improve functional strength and balance for better safety at home.    Time  4    Period  Weeks    Status  New    Target Date  12/05/18      PT SHORT TERM GOAL #2   Title  Patient will deny any falls for 5 days to demonstrate improved safety awareness at home and work.     Time  4    Period  Weeks    Status  New    Target Date  12/05/18      PT SHORT TERM GOAL #3   Title  Patient will exhibits good hand placement with transfers at least 50% of the time to exhibit improved safety awarness and reduce falls    Time  4    Period  Weeks     Status  New    Target Date  12/05/18        PT Long Term Goals - 11/07/18 1455      PT LONG TERM GOAL #1   Title  Patient will exhibit improved safety awareness  with gait with better rollator management, keeping walker in optimum position at least 50% of the time during turns.     Time  8    Period  Weeks    Status  New    Target Date  01/02/19      PT LONG TERM GOAL #2   Title  Patient will decrease TUG test to <30 seconds in order to achieve an independently mobile status and decrease his risk for falls    Time  8    Period  Weeks    Status  New    Target Date  01/02/19      PT LONG TERM GOAL #3   Title  Patient will increase overall strength to 4+/5 in both UE and LE in order to complete transfers safely with decreased risk for falls.     Time  8    Period  Weeks    Status  New    Target Date  01/02/19            Plan - 12/06/18 1257    Clinical Impression Statement  Patient had significant difficulty today being confused with walker placement and requiring significant time when negotiating cones. Patient continues to have poor walker positioning especially during turns. Instructed patient in advanced balance exercise but has forward flexed posture when unsupported. He would benefit from additional skilled PT intervention to improve strength, balance and gait safety;     Rehab Potential  Fair    Clinical Impairments Affecting Rehab Potential  Positive factors: motivated, family support, previous good responses to PT; Negative factors: high co-pay, progressive disease; Clinical Presentation: Evolving-multiple falls    PT Frequency  2x / week    PT Duration  8 weeks    PT Treatment/Interventions  ADLs/Self Care Home Management;Aquatic Therapy;Electrical Stimulation;Moist Heat;DME Instruction;Gait training;Stair training;Functional mobility training;Therapeutic activities;Therapeutic exercise;Balance training;Neuromuscular re-education;Patient/family education;Manual  techniques;Energy conservation    PT Next Visit Plan  work on Animal nutritionist;     [place]  continue as given;     Consulted and Agree with Plan of Care  Patient;Family member/caregiver       Patient will benefit from skilled therapeutic intervention in order to improve the following deficits and impairments:  Abnormal gait, Decreased balance, Decreased endurance, Decreased knowledge of use of DME, Decreased mobility, Decreased range of motion, Decreased safety awareness, Decreased strength, Difficulty walking, Hypomobility, Increased fascial restricitons, Impaired perceived functional ability, Impaired flexibility, Improper body mechanics, Postural dysfunction  Visit Diagnosis: Muscle weakness (generalized)  Other lack of coordination  Unsteadiness on feet  Abnormal posture  Difficulty in walking, not elsewhere classified     Problem List Patient Active Problem List   Diagnosis Date Noted  . Dementia due to Parkinson's disease without behavioral disturbance ([place]) 07/18/2017  . Neurogenic orthostatic hypotension ([place]) 12/02/2016  . BPH with urinary obstruction 11/14/2016  . Urinary frequency 10/10/2016  . Microscopic hematuria 10/10/2016  . Parkinson's syndrome ([place]) 06/14/2016  . Arthritis 06/14/2016  . Depression 06/14/2016  . Heartburn 06/14/2016  . Urinary incontinence 06/14/2016  . Skin lesion 06/14/2016  . Dementia ([place]) 06/14/2016  . Bilateral edema of lower extremity 06/14/2016    , PT, DPT 12/06/2018, 12:59 PM  [place] MAIN [facility] SERVICES [address] [place], [place], [address] Phone: [phone]   Fax:  [phone]  Name: Luke Durham MRN: [id number] Date of Birth: 20-Jun-1933

## 2018-12-07 ENCOUNTER — Encounter: Payer: Medicare Other | Admitting: Occupational Therapy

## 2018-12-10 ENCOUNTER — Encounter: Payer: Medicare Other | Admitting: Occupational Therapy

## 2018-12-11 ENCOUNTER — Encounter: Payer: Self-pay | Admitting: Physical Therapy

## 2018-12-11 ENCOUNTER — Ambulatory Visit: Payer: Medicare Other

## 2018-12-11 DIAGNOSIS — R278 Other lack of coordination: Secondary | ICD-10-CM

## 2018-12-11 DIAGNOSIS — M6281 Muscle weakness (generalized): Secondary | ICD-10-CM

## 2018-12-11 DIAGNOSIS — R2681 Unsteadiness on feet: Secondary | ICD-10-CM

## 2018-12-11 DIAGNOSIS — R262 Difficulty in walking, not elsewhere classified: Secondary | ICD-10-CM

## 2018-12-11 NOTE — Therapy (Signed)
[place] MAIN [facility] SERVICES [address] [place], [place], [address] Phone: [phone]   Fax:  [phone]  Physical Therapy Treatment  Patient Details  Name: Luke Durham MRN: [id number] Date of Birth: 10/25/1932 Referring Provider (PT): Dr. Manuella Ghazi   Encounter Date: 12/11/2018  PT End of Session - 12/11/18 1250    Visit Number  6    Number of Visits  17    Date for PT Re-Evaluation  01/02/19    Authorization Type  eval on 11/07/18    PT Start Time  1156    PT Stop Time  1230    PT Time Calculation (min)  34 min    Equipment Utilized During Treatment  Gait belt    Activity Tolerance  Patient limited by fatigue;Patient tolerated treatment well    Behavior During Therapy  [facility] for tasks assessed/performed;Flat affect       Past Medical History:  Diagnosis Date  . Anxiety   . Arthritis   . Asthma    childhood asthma  . Cancer ([place]) 7 years ago   lymphoma, [place] (recent)  . Chronic kidney disease   . Colon polyps   . GERD (gastroesophageal reflux disease)   . History of kidney stones   . Parkinson disease [facility])     Past Surgical History:  Procedure Laterality Date  . COLON SURGERY    . CYSTOSCOPY WITH LITHOLAPAXY N/A 11/14/2016   Procedure: CYSTOSCOPY WITH LITHOLAPAXY;  Surgeon: Hollice Espy, MD;  Location: [facility] ORS;  Service: Urology;  Laterality: N/A;  . EYE SURGERY Bilateral    Cataract Extraction with IOL  . HERNIA REPAIR  20 years  . MOHS SURGERY    . SMALL INTESTINE SURGERY     Per patient 7 years  . TRANSURETHRAL RESECTION OF PROSTATE N/A 11/14/2016   Procedure: TRANSURETHRAL RESECTION OF THE PROSTATE (TURP);  Surgeon: Hollice Espy, MD;  Location: [facility] ORS;  Service: Urology;  Laterality: N/A;    There were no vitals filed for this visit.  Subjective Assessment - 12/11/18 1203    Subjective  Patient arrived 52mins late to session, session modified accordingly. Patient reported that he has had several bad days  since his last PT session. Reported he has had a lot of freezing, as well as one fall on Saturday. Stated he was trying to go backwards and he feel. Denies hitting his head and that he did not get hurt.    Pertinent History  86 yo Male diagnosed with parkinson's disease presents to PT with worsening gait, balance. He reports increased episodes of freezing and has a follow up appointment with Dr. Manuella Ghazi to assess medication and overall disease progression. Patient has been through [place] and reports mixed adherence to HEP. He reports, "I don't do the exercises as much as I should because of laziness and they aren't much fun." He reports falling every few days (8 falls in last week) which occurs mostly during walking; Reports no change in memory; Does exhibit increased flexed posture;     Limitations  Sitting;Standing;Walking    How long can you sit comfortably?  N/A    How long can you stand comfortably?  10 min with forward flexed posture    How long can you walk comfortably?  level surface with rollator, limited to <200 feet;     Patient Stated Goals  reduce falls, improve flexibility; "Be safer walking."     Currently in Pain?  Yes    Pain Location  --  R shoulder and R groin, patient reported he is more painful in his R groin.      TREATMENT: Warm up on Nustep BLE only level 2 x4 min (Unbilled) concurrent with moist heat to groin;     NMR: PT instructed patient in dynamic balance exercise:   Standing in parallel bars:   Standing on airex foam: -alternate toe taps to 4 inch step with 0 rail assist 2x10 reps bilaterally; -Standing one foot on airex, one foot on 4 inch step, BUE wand flexion 2x10 reps each foot on step FT on foam with vertical and horizontal head turns x10 ea way, CGa-minAx1, mod verbal cues due to confusion to maintain exercise form/technique Grabbing ball from random directions FA without UE support 12-15reps  -Patient required mod VCs for balance stability, including  to increase trunk control for less loss of balance with smaller base of support; He also required mod VCs to improve erect posture as when he is unsupported often leans into heavy forward flexion, mild improvement with verbal cues    Outside bars: Therapeutic exercises: Pt ambulated ~4ftx2 with rollator. Verbal and tactile cues for rollator management, pt tends to ambulate with rollator outside BOS, mod-maxAx2 to maintain proper positioning. Patient many episodes of festinating and freezing of gait this session.                         PT Education - 12/11/18 1205    Education provided  Yes    Education Details  Le strengthening, rollator use, balance strategies    Person(s) Educated  Patient    Methods  Explanation;Demonstration;Verbal cues    Comprehension  Verbalized understanding;Verbal cues required;Tactile cues required       PT Short Term Goals - 11/07/18 1454      PT SHORT TERM GOAL #1   Title  Patient will be adherent to HEP at least 3x a week to improve functional strength and balance for better safety at home.    Time  4    Period  Weeks    Status  New    Target Date  12/05/18      PT SHORT TERM GOAL #2   Title  Patient will deny any falls for 5 days to demonstrate improved safety awareness at home and work.     Time  4    Period  Weeks    Status  New    Target Date  12/05/18      PT SHORT TERM GOAL #3   Title  Patient will exhibits good hand placement with transfers at least 50% of the time to exhibit improved safety awarness and reduce falls    Time  4    Period  Weeks    Status  New    Target Date  12/05/18        PT Long Term Goals - 11/07/18 1455      PT LONG TERM GOAL #1   Title  Patient will exhibit improved safety awareness with gait with better rollator management, keeping walker in optimum position at least 50% of the time during turns.     Time  8    Period  Weeks    Status  New    Target Date  01/02/19      PT LONG TERM  GOAL #2   Title  Patient will decrease TUG test to <30 seconds in order to achieve an independently mobile status and decrease his risk  for falls    Time  8    Period  Weeks    Status  New    Target Date  01/02/19      PT LONG TERM GOAL #3   Title  Patient will increase overall strength to 4+/5 in both UE and LE in order to complete transfers safely with decreased risk for falls.     Time  8    Period  Weeks    Status  New    Target Date  01/02/19            Plan - 12/11/18 1241    Clinical Impression Statement  Patient challenged with mobility this session due to increased festination and freezing of gait. Verbal cues and mod-maxAx1 to maintain safety with rollator needed as well due to flexed trunk and potential confusion. Overall the patient responded well to treatment, and would benefit from further skilled PT to progress as able to maximize safety and mobility.     Rehab Potential  Fair    Clinical Impairments Affecting Rehab Potential  Positive factors: motivated, family support, previous good responses to PT; Negative factors: high co-pay, progressive disease; Clinical Presentation: Evolving-multiple falls    PT Frequency  2x / week    PT Duration  8 weeks    PT Treatment/Interventions  ADLs/Self Care Home Management;Aquatic Therapy;Electrical Stimulation;Moist Heat;DME Instruction;Gait training;Stair training;Functional mobility training;Therapeutic activities;Therapeutic exercise;Balance training;Neuromuscular re-education;Patient/family education;Manual techniques;Energy conservation    PT Next Visit Plan  work on Animal nutritionist;     [place]  continue as given;     Consulted and Agree with Plan of Care  Patient       Patient will benefit from skilled therapeutic intervention in order to improve the following deficits and impairments:  Abnormal gait, Decreased balance, Decreased endurance, Decreased knowledge of use of DME, Decreased mobility, Decreased  range of motion, Decreased safety awareness, Decreased strength, Difficulty walking, Hypomobility, Increased fascial restricitons, Impaired perceived functional ability, Impaired flexibility, Improper body mechanics, Postural dysfunction  Visit Diagnosis: Muscle weakness (generalized)  Difficulty in walking, not elsewhere classified  Other lack of coordination  Unsteadiness on feet     Problem List Patient Active Problem List   Diagnosis Date Noted  . Dementia due to Parkinson's disease without behavioral disturbance ([place]) 07/18/2017  . Neurogenic orthostatic hypotension ([place]) 12/02/2016  . BPH with urinary obstruction 11/14/2016  . Urinary frequency 10/10/2016  . Microscopic hematuria 10/10/2016  . Parkinson's syndrome ([place]) 06/14/2016  . Arthritis 06/14/2016  . Depression 06/14/2016  . Heartburn 06/14/2016  . Urinary incontinence 06/14/2016  . Skin lesion 06/14/2016  . Dementia ([place]) 06/14/2016  . Bilateral edema of lower extremity 06/14/2016    Lieutenant Diego PT, DPT 12:52 PM,12/11/18 [phone]  [place] MAIN [facility] SERVICES [address] [place], [place], [address] Phone: [phone]   Fax:  [phone]  Name: Luke Durham MRN: [id number] Date of Birth: Feb 22, 1933

## 2018-12-13 ENCOUNTER — Ambulatory Visit: Payer: Medicare Other | Admitting: Physical Therapy

## 2018-12-14 ENCOUNTER — Ambulatory Visit: Payer: Medicare Other | Admitting: Occupational Therapy

## 2018-12-14 ENCOUNTER — Encounter: Payer: Medicare Other | Admitting: Occupational Therapy

## 2018-12-17 ENCOUNTER — Encounter: Payer: Medicare Other | Admitting: Occupational Therapy

## 2018-12-18 ENCOUNTER — Ambulatory Visit: Payer: Medicare Other | Admitting: Physical Therapy

## 2018-12-19 ENCOUNTER — Ambulatory Visit: Payer: Medicare Other

## 2018-12-19 ENCOUNTER — Encounter: Payer: Self-pay | Admitting: Physical Therapy

## 2018-12-19 DIAGNOSIS — M6281 Muscle weakness (generalized): Secondary | ICD-10-CM | POA: Diagnosis not present

## 2018-12-19 DIAGNOSIS — R2681 Unsteadiness on feet: Secondary | ICD-10-CM

## 2018-12-19 DIAGNOSIS — R262 Difficulty in walking, not elsewhere classified: Secondary | ICD-10-CM

## 2018-12-19 DIAGNOSIS — R278 Other lack of coordination: Secondary | ICD-10-CM

## 2018-12-19 NOTE — Therapy (Signed)
[place] MAIN [facility] SERVICES [address] [place], [place], [address] Phone: [phone]   Fax:  [phone]  Physical Therapy Treatment  Patient Details  Name: Luke Durham MRN: [id number] Date of Birth: 02-03-1933 Referring Provider (PT): Dr. Manuella Ghazi   Encounter Date: 12/19/2018  PT End of Session - 12/19/18 1601    Visit Number  7    Number of Visits  17    Date for PT Re-Evaluation  01/02/19    Authorization Type  eval on 11/07/18    PT Start Time  1305    PT Stop Time  1345    PT Time Calculation (min)  40 min    Equipment Utilized During Treatment  Gait belt    Activity Tolerance  Patient limited by fatigue    Behavior During Therapy  [facility] for tasks assessed/performed;Flat affect       Past Medical History:  Diagnosis Date  . Anxiety   . Arthritis   . Asthma    childhood asthma  . Cancer ([place]) 7 years ago   lymphoma, [place] (recent)  . Chronic kidney disease   . Colon polyps   . GERD (gastroesophageal reflux disease)   . History of kidney stones   . Parkinson disease [facility])     Past Surgical History:  Procedure Laterality Date  . COLON SURGERY    . CYSTOSCOPY WITH LITHOLAPAXY N/A 11/14/2016   Procedure: CYSTOSCOPY WITH LITHOLAPAXY;  Surgeon: Hollice Espy, MD;  Location: [facility] ORS;  Service: Urology;  Laterality: N/A;  . EYE SURGERY Bilateral    Cataract Extraction with IOL  . HERNIA REPAIR  20 years  . MOHS SURGERY    . SMALL INTESTINE SURGERY     Per patient 7 years  . TRANSURETHRAL RESECTION OF PROSTATE N/A 11/14/2016   Procedure: TRANSURETHRAL RESECTION OF THE PROSTATE (TURP);  Surgeon: Hollice Espy, MD;  Location: [facility] ORS;  Service: Urology;  Laterality: N/A;    There were no vitals filed for this visit.  Subjective Assessment - 12/19/18 1309    Subjective  Patient reported that he has fallen at least 3 times since his last PT session, has had a lot of freezing episodes. Does endorse that he did hit his head at  least once, but has not been to the hospital.    Patient is accompained by:  Family member    Pertinent History  85 yo Male diagnosed with parkinson's disease presents to PT with worsening gait, balance. He reports increased episodes of freezing and has a follow up appointment with Dr. Manuella Ghazi to assess medication and overall disease progression. Patient has been through [place] and reports mixed adherence to HEP. He reports, "I don't do the exercises as much as I should because of laziness and they aren't much fun." He reports falling every few days (8 falls in last week) which occurs mostly during walking; Reports no change in memory; Does exhibit increased flexed posture;     Limitations  Sitting;Standing;Walking    How long can you sit comfortably?  N/A    How long can you stand comfortably?  10 min with forward flexed posture    How long can you walk comfortably?  level surface with rollator, limited to <200 feet;     Currently in Pain?  Yes    Pain Score  8    groin pain as well   Pain Location  Shoulder    Pain Orientation  Right    Pain Type  Chronic pain    Pain Onset  More than a month ago       TREATMENT: Warm up on Nustep BLE only level 2 x4 min (Unbilled)    NMR: PT instructed patient in dynamic balance exercise:  FT on foam with vertical and horizontal head turns x10 ea way, CGa-minAx1, mod verbal cues due to confusion to maintain exercise form/technique Seated marching 2x10 Seated marching with alternating shoulder flexion to address coordination, significant difficulty, tactile and minAx1 throughout 2x10 Seated alternating toe taps, extensive cues 30sec x2   Pt tends to lean heavy into forward flexion, intermittent cues for upright posture, to maintain exercise technique, occasional cues to stop and reset due to coordination issues with exercise form. CGA-minAx1 during balance activities.   Outside bars: Therapeutic exercises: Pt ambulated ~152ft with rollator. Verbal  and tactile cues for rollator management, pt tends to ambulate with rollator outside BOS, modAx1 to maintain proper positioning. Patient with many episodes of festinating and freezing of gait this session, but less than last session.  sit to stands 2x10 from elevated plinth with moist hot pack to R shoulder (unbilled)      PT Short Term Goals - 11/07/18 1454      PT SHORT TERM GOAL #1   Title  Patient will be adherent to HEP at least 3x a week to improve functional strength and balance for better safety at home.    Time  4    Period  Weeks    Status  New    Target Date  12/05/18      PT SHORT TERM GOAL #2   Title  Patient will deny any falls for 5 days to demonstrate improved safety awareness at home and work.     Time  4    Period  Weeks    Status  New    Target Date  12/05/18      PT SHORT TERM GOAL #3   Title  Patient will exhibits good hand placement with transfers at least 50% of the time to exhibit improved safety awarness and reduce falls    Time  4    Period  Weeks    Status  New    Target Date  12/05/18        PT Long Term Goals - 11/07/18 1455      PT LONG TERM GOAL #1   Title  Patient will exhibit improved safety awareness with gait with better rollator management, keeping walker in optimum position at least 50% of the time during turns.     Time  8    Period  Weeks    Status  New    Target Date  01/02/19      PT LONG TERM GOAL #2   Title  Patient will decrease TUG test to <30 seconds in order to achieve an independently mobile status and decrease his risk for falls    Time  8    Period  Weeks    Status  New    Target Date  01/02/19      PT LONG TERM GOAL #3   Title  Patient will increase overall strength to 4+/5 in both UE and LE in order to complete transfers safely with decreased risk for falls.     Time  8    Period  Weeks    Status  New    Target Date  01/02/19            Plan -  12/19/18 1557    Clinical Impression Statement  Patient  with difficulty with mobility this session due to freezing. Several exercises focused on promoting coordination, pt needed significant tactile and visual cues to perform, instructed to perform with HEP. Minimal improvement noted with ambulation with use of rollator this session, modAx1 to [facility] rollator within base of support. The patient would benefit from further skilled PT to continue to progress pt towards goals.    Rehab Potential  Fair    Clinical Impairments Affecting Rehab Potential  Positive factors: motivated, family support, previous good responses to PT; Negative factors: high co-pay, progressive disease; Clinical Presentation: Evolving-multiple falls    PT Frequency  2x / week    PT Duration  8 weeks    PT Treatment/Interventions  ADLs/Self Care Home Management;Aquatic Therapy;Electrical Stimulation;Moist Heat;DME Instruction;Gait training;Stair training;Functional mobility training;Therapeutic activities;Therapeutic exercise;Balance training;Neuromuscular re-education;Patient/family education;Manual techniques;Energy conservation    PT Next Visit Plan  work on Animal nutritionist;     [place]  continue as given;     Consulted and Agree with Plan of Care  Patient       Patient will benefit from skilled therapeutic intervention in order to improve the following deficits and impairments:  Abnormal gait, Decreased balance, Decreased endurance, Decreased knowledge of use of DME, Decreased mobility, Decreased range of motion, Decreased safety awareness, Decreased strength, Difficulty walking, Hypomobility, Increased fascial restricitons, Impaired perceived functional ability, Impaired flexibility, Improper body mechanics, Postural dysfunction  Visit Diagnosis: Muscle weakness (generalized)  Difficulty in walking, not elsewhere classified  Other lack of coordination  Unsteadiness on feet     Problem List Patient Active Problem List   Diagnosis Date Noted  .  Dementia due to Parkinson's disease without behavioral disturbance ([place]) 07/18/2017  . Neurogenic orthostatic hypotension ([place]) 12/02/2016  . BPH with urinary obstruction 11/14/2016  . Urinary frequency 10/10/2016  . Microscopic hematuria 10/10/2016  . Parkinson's syndrome ([place]) 06/14/2016  . Arthritis 06/14/2016  . Depression 06/14/2016  . Heartburn 06/14/2016  . Urinary incontinence 06/14/2016  . Skin lesion 06/14/2016  . Dementia ([place]) 06/14/2016  . Bilateral edema of lower extremity 06/14/2016    Lieutenant Diego PT, DPT 4:14 PM,12/19/18 [place] MAIN [facility] SERVICES [address] [place], [place], [address] Phone: [phone]   Fax:  [phone]  Name: Dionisios Ricci MRN: [id number] Date of Birth: [birth year]

## 2018-12-20 ENCOUNTER — Ambulatory Visit: Payer: Medicare Other | Admitting: Physical Therapy

## 2018-12-21 ENCOUNTER — Encounter: Payer: Medicare Other | Admitting: Occupational Therapy

## 2018-12-21 ENCOUNTER — Ambulatory Visit: Payer: Medicare Other | Admitting: Occupational Therapy

## 2018-12-21 DIAGNOSIS — R262 Difficulty in walking, not elsewhere classified: Secondary | ICD-10-CM

## 2018-12-21 DIAGNOSIS — M6281 Muscle weakness (generalized): Secondary | ICD-10-CM | POA: Diagnosis not present

## 2018-12-21 DIAGNOSIS — R293 Abnormal posture: Secondary | ICD-10-CM

## 2018-12-21 DIAGNOSIS — R278 Other lack of coordination: Secondary | ICD-10-CM

## 2018-12-21 DIAGNOSIS — R2681 Unsteadiness on feet: Secondary | ICD-10-CM

## 2018-12-22 NOTE — Therapy (Signed)
[place] MAIN [facility] SERVICES [address] [place], [place], [address] Phone: [phone]   Fax:  [phone]  Occupational Therapy Treatment  Patient Details  Name: Luke Durham MRN: [id number] Date of Birth: Sep 29, 1933 No data recorded  Encounter Date: 12/21/2018    Past Medical History:  Diagnosis Date  . Anxiety   . Arthritis   . Asthma    childhood asthma  . Cancer ([place]) 7 years ago   lymphoma, [place] (recent)  . Chronic kidney disease   . Colon polyps   . GERD (gastroesophageal reflux disease)   . History of kidney stones   . Parkinson disease [facility])     Past Surgical History:  Procedure Laterality Date  . COLON SURGERY    . CYSTOSCOPY WITH LITHOLAPAXY N/A 11/14/2016   Procedure: CYSTOSCOPY WITH LITHOLAPAXY;  Surgeon: Hollice Espy, MD;  Location: [facility] ORS;  Service: Urology;  Laterality: N/A;  . EYE SURGERY Bilateral    Cataract Extraction with IOL  . HERNIA REPAIR  20 years  . MOHS SURGERY    . SMALL INTESTINE SURGERY     Per patient 7 years  . TRANSURETHRAL RESECTION OF PROSTATE N/A 11/14/2016   Procedure: TRANSURETHRAL RESECTION OF THE PROSTATE (TURP);  Surgeon: Hollice Espy, MD;  Location: [facility] ORS;  Service: Urology;  Laterality: N/A;    There were no vitals filed for this visit.                             OT Long Term Goals - 11/10/18 1541      OT LONG TERM GOAL #1   Title  Patient will demonstrate increase right hand grip to be able to cut food with modified independence.     Baseline  occasional assist required.    Time  12    Period  Weeks    Status  On-going      OT LONG TERM GOAL #2   Title  Patient will complete donning long sleeve shirt with modified independence.    Baseline  requires assist with long sleeves, can perform short sleeves.    Time  12    Period  Weeks    Status  Achieved      OT LONG TERM GOAL #3   Title  Patient will improve coordination to manage  buttons on clothing with occasional assistance only.    Baseline  requires assist with buttons each time    Time  12    Period  Weeks    Status  On-going      OT LONG TERM GOAL #4   Title  Patient will demonstrate upright posture with shoulders back and head up during exercises with minimal cues.      Baseline  moderate cues for positioning during exercises.     Time  12    Period  Weeks    Status  On-going      OT LONG TERM GOAL #5   Title  Patient will demonstrate turning behaviors with rollator using large steps and keeping feet behind the rollator with all turns with occasional minimal cues.     Time  12    Period  Weeks    Status  On-going      OT LONG TERM GOAL #6   Title  Patient will improve gait speed and endurance and be able to walk 950 feet in 6 minutes to negotiate around the home and  community safely in 12 weeks    Baseline  860 at reassessment, 354 at recert 6/56, 812 feet 04/27/1699    Time  4    Period  Weeks    Status  On-going      OT LONG TERM GOAL #7   Title  Patient will complete HEP for maximal daily exercises with modified independence in 4 weeks    Time  12    Period  Weeks    Status  On-going      OT LONG TERM GOAL #8   Title  Patient will transfer from sit to stand without the use of arms safely and independently from a variety of chairs/surfaces in 4 weeks.     Baseline  difficulty from lower surfaces    Time  4    Period  Weeks    Status  Achieved      OT LONG TERM GOAL  #9   Baseline  Patient will demonstrate floor transfers with use of chair to assist with min guard and cues if needed.     Time  12    Period  Weeks    Status  On-going              Patient will benefit from skilled therapeutic intervention in order to improve the following deficits and impairments:     Visit Diagnosis: Muscle weakness (generalized)  Difficulty in walking, not elsewhere classified  Other lack of coordination  Unsteadiness on feet  Abnormal  posture    Problem List Patient Active Problem List   Diagnosis Date Noted  . Dementia due to Parkinson's disease without behavioral disturbance ([place]) 07/18/2017  . Neurogenic orthostatic hypotension ([place]) 12/02/2016  . BPH with urinary obstruction 11/14/2016  . Urinary frequency 10/10/2016  . Microscopic hematuria 10/10/2016  . Parkinson's syndrome ([place]) 06/14/2016  . Arthritis 06/14/2016  . Depression 06/14/2016  . Heartburn 06/14/2016  . Urinary incontinence 06/14/2016  . Skin lesion 06/14/2016  . Dementia ([place]) 06/14/2016  . Bilateral edema of lower extremity 06/14/2016    Lovett,Amy 12/22/2018, 4:04 PM  [place] MAIN [facility] SERVICES [address] [place], [place], [address] Phone: [phone]   Fax:  [phone]  Name: Luke Durham MRN: [id number] Date of Birth: 05/18/1933

## 2018-12-24 ENCOUNTER — Ambulatory Visit: Payer: Medicare Other | Admitting: Physical Therapy

## 2018-12-24 ENCOUNTER — Ambulatory Visit: Payer: Medicare Other

## 2018-12-24 ENCOUNTER — Ambulatory Visit: Payer: Medicare Other | Attending: Physician Assistant | Admitting: Occupational Therapy

## 2018-12-24 DIAGNOSIS — M6281 Muscle weakness (generalized): Secondary | ICD-10-CM

## 2018-12-24 DIAGNOSIS — R278 Other lack of coordination: Secondary | ICD-10-CM | POA: Diagnosis present

## 2018-12-24 DIAGNOSIS — G8929 Other chronic pain: Secondary | ICD-10-CM | POA: Diagnosis present

## 2018-12-24 DIAGNOSIS — R2681 Unsteadiness on feet: Secondary | ICD-10-CM

## 2018-12-24 DIAGNOSIS — R293 Abnormal posture: Secondary | ICD-10-CM

## 2018-12-24 DIAGNOSIS — M25511 Pain in right shoulder: Secondary | ICD-10-CM

## 2018-12-24 DIAGNOSIS — R262 Difficulty in walking, not elsewhere classified: Secondary | ICD-10-CM | POA: Insufficient documentation

## 2018-12-24 NOTE — Therapy (Signed)
[place] MAIN [facility] SERVICES [address] [place], [place], [address] Phone: [phone]   Fax:  [phone]  Physical Therapy Treatment  Patient Details  Name: Luke Durham MRN: [id number] Date of Birth: [birth year] Referring Provider (PT): Dr. Manuella Ghazi   Encounter Date: 12/24/2018  PT End of Session - 12/24/18 1154    Visit Number  8    Number of Visits  17    Date for PT Re-Evaluation  01/02/19    Authorization Type  eval on 11/07/18    PT Start Time  1145    PT Stop Time  1230    PT Time Calculation (min)  45 min    Equipment Utilized During Treatment  Gait belt    Activity Tolerance  Patient limited by fatigue    Behavior During Therapy  [facility] for tasks assessed/performed;Flat affect       Past Medical History:  Diagnosis Date  . Anxiety   . Arthritis   . Asthma    childhood asthma  . Cancer ([place]) 7 years ago   lymphoma, [place] (recent)  . Chronic kidney disease   . Colon polyps   . GERD (gastroesophageal reflux disease)   . History of kidney stones   . Parkinson disease [facility])     Past Surgical History:  Procedure Laterality Date  . COLON SURGERY    . CYSTOSCOPY WITH LITHOLAPAXY N/A 11/14/2016   Procedure: CYSTOSCOPY WITH LITHOLAPAXY;  Surgeon: Hollice Espy, MD;  Location: [facility] ORS;  Service: Urology;  Laterality: N/A;  . EYE SURGERY Bilateral    Cataract Extraction with IOL  . HERNIA REPAIR  20 years  . MOHS SURGERY    . SMALL INTESTINE SURGERY     Per patient 7 years  . TRANSURETHRAL RESECTION OF PROSTATE N/A 11/14/2016   Procedure: TRANSURETHRAL RESECTION OF THE PROSTATE (TURP);  Surgeon: Hollice Espy, MD;  Location: [facility] ORS;  Service: Urology;  Laterality: N/A;    There were no vitals filed for this visit.  Subjective Assessment - 12/24/18 1152    Subjective  Patient stated he has not fallen since his last PT appointment. Reported that he feels more stiff than usual. Also complains of R groin pain.    Pertinent  History  85 yo Male diagnosed with parkinson's disease presents to PT with worsening gait, balance. He reports increased episodes of freezing and has a follow up appointment with Dr. Manuella Ghazi to assess medication and overall disease progression. Patient has been through [place] and reports mixed adherence to HEP. He reports, "I don't do the exercises as much as I should because of laziness and they aren't much fun." He reports falling every few days (8 falls in last week) which occurs mostly during walking; Reports no change in memory; Does exhibit increased flexed posture;     Limitations  Sitting;Standing;Walking    How long can you sit comfortably?  N/A    How long can you stand comfortably?  10 min with forward flexed posture    How long can you walk comfortably?  level surface with rollator, limited to <200 feet;     Patient Stated Goals  reduce falls, improve flexibility; "Be safer walking."     Currently in Pain?  Yes    Pain Score  7     Pain Location  Leg    Pain Orientation  Right    Pain Descriptors / Indicators  Tender    Pain Type  Chronic pain  Pain Onset  More than a month ago        Warm up on Nustep BLE only level 2 x4 min (Unbilled)   ?  NMR:  Seated marching with ipsilateral shoulder flexion to address coordination, min verbal cues needed x10 Seated alternating toe taps, mod verbal/visual cues?30sec x2   In parallel bars: Standing on airex foam: alternate toe taps to 4 inch step with0rail assist2x10reps bilaterally; Grabbing ball from random directions FA without UE support 12-15reps Standing feet together with wand flexion over head x10 Standing modified tandem stance with wand flexion over head x10  Pt tends to lean heavy into forward flexion, intermittent cues for upright posture, to maintain exercise technique, occasional cues to stop and reset due to coordination issues with exercise form. CGA-minAx1 during balance activities.   Outside bars:  Therapeutic  exercises:  Pt ambulated ~758ft?with rollator. Verbal and tactile cues for rollator management, pt tends to ambulate with rollator outside BOS, minAx1 to maintain proper positioning. Patient with 1-2 episodes of festinating and freezing of gait this session sit to stands 2x10 from elevated plinth with ball raises over head  ?   -Patient required mod VCs for balance stability, including to increase trunk control for less loss of balance with smaller base of support; He also required mod VCs to improve erect posture as when he is unsupported often leans into heavy forward flexion, mild improvement with verbal cues      PT Education - 12/24/18 1153    Education provided  Yes    Education Details  LE strengthening, coordination, rollator use    Person(s) Educated  Patient    Methods  Explanation;Demonstration;Verbal cues    Comprehension  Verbalized understanding;Returned demonstration;Verbal cues required;Tactile cues required       PT Short Term Goals - 12/24/18 1403      PT SHORT TERM GOAL #1   Title  Patient will be adherent to HEP at least 3x a week to improve functional strength and balance for better safety at home.    Time  4    Period  Weeks    Status  On-going    Target Date  12/05/18      PT SHORT TERM GOAL #2   Title  Patient will deny any falls for 5 days to demonstrate improved safety awareness at home and work.     Time  4    Period  Weeks    Status  New    Target Date  12/05/18      PT SHORT TERM GOAL #3   Title  Patient will exhibits good hand placement with transfers at least 50% of the time to exhibit improved safety awarness and reduce falls    Time  4    Period  Weeks    Status  On-going    Target Date  12/05/18        PT Long Term Goals - 11/07/18 1455      PT LONG TERM GOAL #1   Title  Patient will exhibit improved safety awareness with gait with better rollator management, keeping walker in optimum position at least 50% of the time during turns.      Time  8    Period  Weeks    Status  New    Target Date  01/02/19      PT LONG TERM GOAL #2   Title  Patient will decrease TUG test to <30 seconds in order to achieve an independently mobile  status and decrease his risk for falls    Time  8    Period  Weeks    Status  New    Target Date  01/02/19      PT LONG TERM GOAL #3   Title  Patient will increase overall strength to 4+/5 in both UE and LE in order to complete transfers safely with decreased risk for falls.     Time  8    Period  Weeks    Status  New    Target Date  01/02/19            Plan - 12/24/18 1404    Clinical Impression Statement  Patient with some improvement noted, decreased freezing episodes this session. Improved ability to coordinate motions this session as well. Patient most challenged by step length and postural stability during ambulation. Improved ability to attend to proper 4WW use during ambulation as well. The patient would benefit from further skilled PT to continue to maximize functional abilities.    Rehab Potential  Fair    Clinical Impairments Affecting Rehab Potential  Positive factors: motivated, family support, previous good responses to PT; Negative factors: high co-pay, progressive disease; Clinical Presentation: Evolving-multiple falls    PT Frequency  2x / week    PT Duration  8 weeks    PT Treatment/Interventions  ADLs/Self Care Home Management;Aquatic Therapy;Electrical Stimulation;Moist Heat;DME Instruction;Gait training;Stair training;Functional mobility training;Therapeutic activities;Therapeutic exercise;Balance training;Neuromuscular re-education;Patient/family education;Manual techniques;Energy conservation    PT Next Visit Plan  work on Animal nutritionist;     [place]  continue as given;     Consulted and Agree with Plan of Care  Patient       Patient will benefit from skilled therapeutic intervention in order to improve the following deficits and impairments:   Abnormal gait, Decreased balance, Decreased endurance, Decreased knowledge of use of DME, Decreased mobility, Decreased range of motion, Decreased safety awareness, Decreased strength, Difficulty walking, Hypomobility, Increased fascial restricitons, Impaired perceived functional ability, Impaired flexibility, Improper body mechanics, Postural dysfunction  Visit Diagnosis: Muscle weakness (generalized)  Difficulty in walking, not elsewhere classified  Other lack of coordination  Unsteadiness on feet  Abnormal posture  Chronic right shoulder pain     Problem List Patient Active Problem List   Diagnosis Date Noted  . Dementia due to Parkinson's disease without behavioral disturbance ([place]) 07/18/2017  . Neurogenic orthostatic hypotension ([place]) 12/02/2016  . BPH with urinary obstruction 11/14/2016  . Urinary frequency 10/10/2016  . Microscopic hematuria 10/10/2016  . Parkinson's syndrome ([place]) 06/14/2016  . Arthritis 06/14/2016  . Depression 06/14/2016  . Heartburn 06/14/2016  . Urinary incontinence 06/14/2016  . Skin lesion 06/14/2016  . Dementia ([place]) 06/14/2016  . Bilateral edema of lower extremity 06/14/2016    Lieutenant Diego PT, DPT 2:11 PM,12/24/18 [place] MAIN [facility] SERVICES [address] [place], [place], [address] Phone: [phone]   Fax:  [phone]  Name: Luke Durham MRN: [id number] Date of Birth: Aug 04, 1933

## 2018-12-26 ENCOUNTER — Ambulatory Visit: Payer: Medicare Other | Admitting: Occupational Therapy

## 2018-12-26 ENCOUNTER — Ambulatory Visit: Payer: Medicare Other | Admitting: Physical Therapy

## 2018-12-26 ENCOUNTER — Ambulatory Visit: Payer: Medicare Other

## 2018-12-26 DIAGNOSIS — M6281 Muscle weakness (generalized): Secondary | ICD-10-CM | POA: Diagnosis not present

## 2018-12-26 DIAGNOSIS — R262 Difficulty in walking, not elsewhere classified: Secondary | ICD-10-CM

## 2018-12-26 DIAGNOSIS — M25511 Pain in right shoulder: Secondary | ICD-10-CM

## 2018-12-26 DIAGNOSIS — R293 Abnormal posture: Secondary | ICD-10-CM

## 2018-12-26 DIAGNOSIS — R2681 Unsteadiness on feet: Secondary | ICD-10-CM

## 2018-12-26 DIAGNOSIS — R278 Other lack of coordination: Secondary | ICD-10-CM

## 2018-12-26 DIAGNOSIS — G8929 Other chronic pain: Secondary | ICD-10-CM

## 2018-12-26 NOTE — Therapy (Signed)
[place] MAIN [facility] SERVICES [address] [place], [place], [address] Phone: [phone]   Fax:  [phone]  Physical Therapy Treatment  Patient Details  Name: Luke Durham MRN: [id number] Date of Birth: October 24, 1933 Referring Provider (PT): Dr. Manuella Ghazi   Encounter Date: 12/26/2018  PT End of Session - 12/26/18 1156    Visit Number  9    Number of Visits  17    Date for PT Re-Evaluation  01/02/19    Authorization Type  eval on 11/07/18    Authorization Time Period  --    PT Start Time  1148    PT Stop Time  1233    PT Time Calculation (min)  45 min    Equipment Utilized During Treatment  Gait belt    Activity Tolerance  Patient limited by fatigue    Behavior During Therapy  [facility] for tasks assessed/performed;Flat affect       Past Medical History:  Diagnosis Date  . Anxiety   . Arthritis   . Asthma    childhood asthma  . Cancer ([place]) 7 years ago   lymphoma, [place] (recent)  . Chronic kidney disease   . Colon polyps   . GERD (gastroesophageal reflux disease)   . History of kidney stones   . Parkinson disease [facility])     Past Surgical History:  Procedure Laterality Date  . COLON SURGERY    . CYSTOSCOPY WITH LITHOLAPAXY N/A 11/14/2016   Procedure: CYSTOSCOPY WITH LITHOLAPAXY;  Surgeon: Hollice Espy, MD;  Location: [facility] ORS;  Service: Urology;  Laterality: N/A;  . EYE SURGERY Bilateral    Cataract Extraction with IOL  . HERNIA REPAIR  20 years  . MOHS SURGERY    . SMALL INTESTINE SURGERY     Per patient 7 years  . TRANSURETHRAL RESECTION OF PROSTATE N/A 11/14/2016   Procedure: TRANSURETHRAL RESECTION OF THE PROSTATE (TURP);  Surgeon: Hollice Espy, MD;  Location: [facility] ORS;  Service: Urology;  Laterality: N/A;    There were no vitals filed for this visit.  Subjective Assessment - 12/26/18 1154    Subjective  Patient reported that he has not fallen since his last PT appointment, stated his groin pain is slightly better today.    Patient is accompained by:  Family member    Pertinent History  85 yo Male diagnosed with parkinson's disease presents to PT with worsening gait, balance. He reports increased episodes of freezing and has a follow up appointment with Dr. Manuella Ghazi to assess medication and overall disease progression. Patient has been through [place] and reports mixed adherence to HEP. He reports, "I don't do the exercises as much as I should because of laziness and they aren't much fun." He reports falling every few days (8 falls in last week) which occurs mostly during walking; Reports no change in memory; Does exhibit increased flexed posture;     Limitations  Sitting;Standing;Walking    How long can you sit comfortably?  N/A    How long can you stand comfortably?  10 min with forward flexed posture    How long can you walk comfortably?  level surface with rollator, limited to <200 feet;     Patient Stated Goals  reduce falls, improve flexibility; "Be safer walking."     Currently in Pain?  Yes    Pain Score  7     Pain Location  Leg    Pain Orientation  Right    Pain Descriptors / Indicators  Tender    Pain Type  Chronic pain    Pain Onset  More than a month ago       Warm up on Nustep BLE only level 2 x 43min (Unbilled) ?  ?  NMR:  alternate toe taps to 4 inch step with 0 rail assist 2x10 reps bilaterally;  Stepping over green theraband laterally x10 Stepping over blue and green band forward with turns (no UE support for steps, Ue support for turns) x5 Agility ladder step throughs, one foot per hole, no UE support multiple passes through, verbal cues for step length Standing on airex pad with 1 UE support, reaching saebo activity, sliding ball from lowest level to second lowest level. Min-modAx1 initially to complete tasks, progressed to constant verbal cueing, visual cues, and occasional minAx1 for physical assist.  Pt tends to lean heavy into forward flexion, intermittent cues for upright posture, to  maintain exercise technique, occasional cues to stop and reset due to coordination issues with exercise form. CGA-minAx1 during balance activities.   Pt ambulated ~133ft?with rollator. Verbal and tactile cues for rollator management, pt tends to ambulate with rollator outside BOS,mod Ax1 to?maintain proper positioning. Patient?with 5-6episodes of festinating and freezing of gait this session, especially with turns and doorways.      PT Education - 12/26/18 1155    Education provided  Yes    Education Details  LE strengthening, exercise technique    Person(s) Educated  Patient    Methods  Explanation;Demonstration;Verbal cues    Comprehension  Verbalized understanding;Verbal cues required;Returned demonstration       PT Short Term Goals - 12/24/18 1403      PT SHORT TERM GOAL #1   Title  Patient will be adherent to HEP at least 3x a week to improve functional strength and balance for better safety at home.    Time  4    Period  Weeks    Status  On-going    Target Date  12/05/18      PT SHORT TERM GOAL #2   Title  Patient will deny any falls for 5 days to demonstrate improved safety awareness at home and work.     Time  4    Period  Weeks    Status  New    Target Date  12/05/18      PT SHORT TERM GOAL #3   Title  Patient will exhibits good hand placement with transfers at least 50% of the time to exhibit improved safety awarness and reduce falls    Time  4    Period  Weeks    Status  On-going    Target Date  12/05/18        PT Long Term Goals - 11/07/18 1455      PT LONG TERM GOAL #1   Title  Patient will exhibit improved safety awareness with gait with better rollator management, keeping walker in optimum position at least 50% of the time during turns.     Time  8    Period  Weeks    Status  New    Target Date  01/02/19      PT LONG TERM GOAL #2   Title  Patient will decrease TUG test to <30 seconds in order to achieve an independently mobile status and decrease  his risk for falls    Time  8    Period  Weeks    Status  New    Target Date  01/02/19  PT LONG TERM GOAL #3   Title  Patient will increase overall strength to 4+/5 in both UE and LE in order to complete transfers safely with decreased risk for falls.     Time  8    Period  Weeks    Status  New    Target Date  01/02/19            Plan - 12/26/18 1251    Clinical Impression Statement  Patient with some improvement in step length today during agility ladder step throughs, as well as upright posture. Session focused on promoting coordination, normalized gait pattern, and posture. Patient needs mod verbal and visual cues to perform all exercises. The patient would benefit from further skilled PT to continue to progress towards goals.    Rehab Potential  Fair    Clinical Impairments Affecting Rehab Potential  Positive factors: motivated, family support, previous good responses to PT; Negative factors: high co-pay, progressive disease; Clinical Presentation: Evolving-multiple falls    PT Frequency  2x / week    PT Duration  8 weeks    PT Treatment/Interventions  ADLs/Self Care Home Management;Aquatic Therapy;Electrical Stimulation;Moist Heat;DME Instruction;Gait training;Stair training;Functional mobility training;Therapeutic activities;Therapeutic exercise;Balance training;Neuromuscular re-education;Patient/family education;Manual techniques;Energy conservation    PT Next Visit Plan  work on Animal nutritionist;     [place]  continue as given;     Consulted and Agree with Plan of Care  Patient       Patient will benefit from skilled therapeutic intervention in order to improve the following deficits and impairments:  Abnormal gait, Decreased balance, Decreased endurance, Decreased knowledge of use of DME, Decreased mobility, Decreased range of motion, Decreased safety awareness, Decreased strength, Difficulty walking, Hypomobility, Increased fascial restricitons,  Impaired perceived functional ability, Impaired flexibility, Improper body mechanics, Postural dysfunction  Visit Diagnosis: Muscle weakness (generalized)  Chronic right shoulder pain  Difficulty in walking, not elsewhere classified  Other lack of coordination  Unsteadiness on feet  Abnormal posture     Problem List Patient Active Problem List   Diagnosis Date Noted  . Dementia due to Parkinson's disease without behavioral disturbance ([place]) 07/18/2017  . Neurogenic orthostatic hypotension ([place]) 12/02/2016  . BPH with urinary obstruction 11/14/2016  . Urinary frequency 10/10/2016  . Microscopic hematuria 10/10/2016  . Parkinson's syndrome ([place]) 06/14/2016  . Arthritis 06/14/2016  . Depression 06/14/2016  . Heartburn 06/14/2016  . Urinary incontinence 06/14/2016  . Skin lesion 06/14/2016  . Dementia ([place]) 06/14/2016  . Bilateral edema of lower extremity 06/14/2016    Lieutenant Diego PT, DPT 12:58 PM,12/26/18 [place] MAIN [facility] SERVICES [address] [place], [place], [address] Phone: [phone]   Fax:  [phone]  Name: Luke Durham MRN: [id number] Date of Birth: 30-Dec-1932

## 2018-12-31 ENCOUNTER — Ambulatory Visit: Payer: Medicare Other | Admitting: Physical Therapy

## 2018-12-31 ENCOUNTER — Ambulatory Visit: Payer: Medicare Other | Admitting: Occupational Therapy

## 2018-12-31 DIAGNOSIS — G8929 Other chronic pain: Secondary | ICD-10-CM

## 2018-12-31 DIAGNOSIS — M6281 Muscle weakness (generalized): Secondary | ICD-10-CM

## 2018-12-31 DIAGNOSIS — M25511 Pain in right shoulder: Secondary | ICD-10-CM

## 2018-12-31 DIAGNOSIS — R278 Other lack of coordination: Secondary | ICD-10-CM

## 2018-12-31 DIAGNOSIS — R2681 Unsteadiness on feet: Secondary | ICD-10-CM

## 2018-12-31 DIAGNOSIS — R262 Difficulty in walking, not elsewhere classified: Secondary | ICD-10-CM

## 2019-01-01 ENCOUNTER — Ambulatory Visit: Payer: Medicare Other | Admitting: Physical Therapy

## 2019-01-02 ENCOUNTER — Other Ambulatory Visit: Payer: Self-pay

## 2019-01-02 ENCOUNTER — Ambulatory Visit: Payer: Medicare Other | Admitting: Occupational Therapy

## 2019-01-02 ENCOUNTER — Ambulatory Visit: Payer: Medicare Other | Admitting: Physical Therapy

## 2019-01-02 DIAGNOSIS — R278 Other lack of coordination: Secondary | ICD-10-CM

## 2019-01-02 DIAGNOSIS — R293 Abnormal posture: Secondary | ICD-10-CM

## 2019-01-02 DIAGNOSIS — M6281 Muscle weakness (generalized): Secondary | ICD-10-CM | POA: Diagnosis not present

## 2019-01-02 DIAGNOSIS — R262 Difficulty in walking, not elsewhere classified: Secondary | ICD-10-CM

## 2019-01-02 DIAGNOSIS — R2681 Unsteadiness on feet: Secondary | ICD-10-CM

## 2019-01-02 DIAGNOSIS — G8929 Other chronic pain: Secondary | ICD-10-CM

## 2019-01-02 DIAGNOSIS — M25511 Pain in right shoulder: Secondary | ICD-10-CM

## 2019-01-02 NOTE — Therapy (Signed)
[place] MAIN [facility] SERVICES [address] [place], [place], [address] Phone: [phone]   Fax:  [phone]  Physical Therapy Treatment Physical Therapy Progress Note   Dates of reporting period  11/07/18 to  01/02/19   Patient Details  Name: Luke Durham MRN: [id number] Date of Birth: [birth year] Referring Provider (PT): Dr. Manuella Ghazi   Encounter Date: 01/02/2019  PT End of Session - 01/02/19 1154    Visit Number  10    Number of Visits  33    Date for PT Re-Evaluation  02/27/19    Authorization Type  progress note 01/02/19    PT Start Time  1150    PT Stop Time  1230    PT Time Calculation (min)  40 min    Equipment Utilized During Treatment  Gait belt    Activity Tolerance  Patient limited by fatigue    Behavior During Therapy  [employer] for tasks assessed/performed;Flat affect       Past Medical History:  Diagnosis Date  . Anxiety   . Arthritis   . Asthma    childhood asthma  . Cancer ([place]) 7 years ago   lymphoma, [place] (recent)  . Chronic kidney disease   . Colon polyps   . GERD (gastroesophageal reflux disease)   . History of kidney stones   . Parkinson disease [facility])     Past Surgical History:  Procedure Laterality Date  . COLON SURGERY    . CYSTOSCOPY WITH LITHOLAPAXY N/A 11/14/2016   Procedure: CYSTOSCOPY WITH LITHOLAPAXY;  Surgeon: Hollice Espy, MD;  Location: [facility] ORS;  Service: Urology;  Laterality: N/A;  . EYE SURGERY Bilateral    Cataract Extraction with IOL  . HERNIA REPAIR  20 years  . MOHS SURGERY    . SMALL INTESTINE SURGERY     Per patient 7 years  . TRANSURETHRAL RESECTION OF PROSTATE N/A 11/14/2016   Procedure: TRANSURETHRAL RESECTION OF THE PROSTATE (TURP);  Surgeon: Hollice Espy, MD;  Location: [facility] ORS;  Service: Urology;  Laterality: N/A;    There were no vitals filed for this visit.  Subjective Assessment - 01/02/19 1152    Subjective  Patient reports doing well; He reports falling yesterday. He  reports walking out to the car after getting his eye glasses fitted and he fell in the parking lot  by the car. He denies any pain or injury as a result of fall;     Patient is accompained by:  Family member    Pertinent History  85 yo Male diagnosed with parkinson's disease presents to PT with worsening gait, balance. He reports increased episodes of freezing and has a follow up appointment with Dr. Manuella Ghazi to assess medication and overall disease progression. Patient has been through [facility] and reports mixed adherence to HEP. He reports, "I don't do the exercises as much as I should because of laziness and they aren't much fun." He reports falling every few days (8 falls in last week) which occurs mostly during walking; Reports no change in memory; Does exhibit increased flexed posture;     Limitations  Sitting;Standing;Walking    How long can you sit comfortably?  N/A    How long can you stand comfortably?  10 min with forward flexed posture    How long can you walk comfortably?  level surface with rollator, limited to <200 feet;     Patient Stated Goals  reduce falls, improve flexibility; "Be safer walking."     Currently in  Pain?  No/denies    Pain Onset  More than a month ago    Multiple Pain Sites  No         [facility] PT Assessment - 01/02/19 0001      Berg Balance Test   Sit to Stand  Able to stand  independently using hands    Standing Unsupported  Able to stand 2 minutes with supervision    Sitting with Back Unsupported but Feet Supported on Floor or Stool  Able to sit safely and securely 2 minutes    Stand to Sit  Controls descent by using hands    Transfers  Able to transfer with verbal cueing and /or supervision    Standing Unsupported with Eyes Closed  Able to stand 10 seconds with supervision    Standing Unsupported with Feet Together  Able to place feet together independently and stand for 1 minute with supervision    From Standing, Reach Forward with Outstretched Arm  Can reach  forward >12 cm safely (5")    From Standing Position, Pick up Object from [place] to pick up shoe, needs supervision    From Standing Position, Turn to Look Behind Over each Shoulder  Looks behind one side only/other side shows less weight shift    Turn 360 Degrees  Needs close supervision or verbal cueing    Standing Unsupported, Alternately Place Feet on Step/Stool  Able to complete >2 steps/needs minimal assist    Standing Unsupported, One Foot in Front  Able to take small step independently and hold 30 seconds    Standing on One Leg  Tries to lift leg/unable to hold 3 seconds but remains standing independently    Total Score  35    Berg comment:  100% risk for falls; improved from 29/56 on 11/14/18      Timed Up and Go Test   Normal TUG (seconds)  31.5    TUG Comments  uses rollator;           TREATMENT: PT instructed patient in timed up and go, Berg Balance assessment to address goals; See above; He does require min VCs for correct exercise technique and positioning; Patient exhibits good safety awareness with hand placement with transfers;  He does continue to require cues for walker placement especially during turns approximately 50% of time with walker often getting ahead of him due to LE freezing;   Finished with Nustep BUE/BLE level 2 x6 min (Unbilled); Patient tolerated well with minimal fatigue at end of session. Overall he exhibits better turning ability and less freezing however he does continue to require cues for walker safety and placement;                  PT Education - 01/02/19 1154    Education provided  Yes    Education Details  Progress towards goals;     Person(s) Educated  Patient    Methods  Explanation    Comprehension  Verbalized understanding       PT Short Term Goals - 01/02/19 1152      PT SHORT TERM GOAL #1   Title  Patient will be adherent to HEP at least 3x a week to improve functional strength and balance for better safety at  home.    Baseline  doing standing tband exercise 3-4 times a week    Time  4    Period  Weeks    Status  Achieved    Target Date  12/05/18      PT SHORT TERM GOAL #2   Title  Patient will deny any falls for 5 days to demonstrate improved safety awareness at home and work.     Baseline  fell yesterday    Time  4    Period  Weeks    Status  Not Met    Target Date  01/30/19      PT SHORT TERM GOAL #3   Title  Patient will exhibits good hand placement with transfers at least 50% of the time to exhibit improved safety awarness and reduce falls    Time  4    Period  Weeks    Status  Achieved    Target Date  12/05/18        PT Long Term Goals - 01/02/19 1203      PT LONG TERM GOAL #1   Title  Patient will exhibit improved safety awareness with gait with better rollator management, keeping walker in optimum position at least 50% of the time during turns.     Baseline  able to do 50% of the time;     Time  8    Period  Weeks    Status  Achieved      PT LONG TERM GOAL #2   Title  Patient will decrease TUG test to <30 seconds in order to achieve an independently mobile status and decrease his risk for falls    Baseline  31.5 sec with rollator    Time  8    Period  Weeks    Status  Partially Met    Target Date  02/27/19      PT LONG TERM GOAL #3   Title  Patient will increase overall strength to 4+/5 in both UE and LE in order to complete transfers safely with decreased risk for falls.     Time  8    Period  Weeks    Status  Partially Met    Target Date  02/27/19      PT LONG TERM GOAL #4   Title  Patient will improve Berg Balance Assessment to >42/56 to exhibit significant improvement in balance and reduce fall risk;     Baseline  01/02/19: 35/56    Time  8    Period  Weeks    Status  New            Plan - 01/02/19 1219    Clinical Impression Statement  Patient progressing well; He exhibits significant improvement in balance with improved berg balance score and  timed up and go. He does continue to get frustrated over lack of mobility and impaired balance. He also continues to require cues for correct walker positioning especially with turns. Patient continues to have falls which are variable. His freezing and gait abnormalities are variable. He would benefit from additional skilled PT intervention to improve strength, balance and gait safety;     Rehab Potential  Fair    Clinical Impairments Affecting Rehab Potential  Positive factors: motivated, family support, previous good responses to PT; Negative factors: high co-pay, progressive disease; Clinical Presentation: Evolving-multiple falls    PT Frequency  2x / week    PT Duration  8 weeks    PT Treatment/Interventions  ADLs/Self Care Home Management;Aquatic Therapy;Electrical Stimulation;Moist Heat;DME Instruction;Gait training;Stair training;Functional mobility training;Therapeutic activities;Therapeutic exercise;Balance training;Neuromuscular re-education;Patient/family education;Manual techniques;Energy conservation    PT Next Visit Plan  work on Animal nutritionist;     PT Home  Exercise Plan  continue as given;     Consulted and Agree with Plan of Care  Patient       Patient will benefit from skilled therapeutic intervention in order to improve the following deficits and impairments:  Abnormal gait, Decreased balance, Decreased endurance, Decreased knowledge of use of DME, Decreased mobility, Decreased range of motion, Decreased safety awareness, Decreased strength, Difficulty walking, Hypomobility, Increased fascial restricitons, Impaired perceived functional ability, Impaired flexibility, Improper body mechanics, Postural dysfunction  Visit Diagnosis: Muscle weakness (generalized)  Chronic right shoulder pain  Difficulty in walking, not elsewhere classified  Other lack of coordination  Unsteadiness on feet     Problem List Patient Active Problem List   Diagnosis Date Noted  . Dementia  due to Parkinson's disease without behavioral disturbance ([place]) 07/18/2017  . Neurogenic orthostatic hypotension ([place]) 12/02/2016  . BPH with urinary obstruction 11/14/2016  . Urinary frequency 10/10/2016  . Microscopic hematuria 10/10/2016  . Parkinson's syndrome ([place]) 06/14/2016  . Arthritis 06/14/2016  . Depression 06/14/2016  . Heartburn 06/14/2016  . Urinary incontinence 06/14/2016  . Skin lesion 06/14/2016  . Dementia ([place]) 06/14/2016  . Bilateral edema of lower extremity 06/14/2016    Trotter,Margaret PT, DPT 01/02/2019, 12:23 PM  [place] MAIN [facility] SERVICES [address] [place], [place], [address] Phone: [phone]   Fax:  [phone]  Name: Luke Durham MRN: [id number] Date of Birth: 1933/02/07

## 2019-01-03 ENCOUNTER — Encounter: Payer: Self-pay | Admitting: Physical Therapy

## 2019-01-03 ENCOUNTER — Ambulatory Visit: Payer: Medicare Other | Admitting: Physical Therapy

## 2019-01-05 ENCOUNTER — Encounter: Payer: Self-pay | Admitting: Occupational Therapy

## 2019-01-05 NOTE — Therapy (Signed)
[place] MAIN [facility] SERVICES [address] [place], [place], [address] Phone: [phone]   Fax:  [phone]  Occupational Therapy Treatment  Patient Details  Name: Luke Durham MRN: [id number] Date of Birth: 21-Apr-1933 No data recorded  Encounter Date: 01/02/2019  OT End of Session - 01/05/19 1904    Visit Number  167    Number of Visits  180    Date for OT Re-Evaluation  01/11/19    OT Start Time  1103    OT Stop Time  1145    OT Time Calculation (min)  42 min    Activity Tolerance  Patient tolerated treatment well    Behavior During Therapy  [facility] for tasks assessed/performed;Flat affect       Past Medical History:  Diagnosis Date  . Anxiety   . Arthritis   . Asthma    childhood asthma  . Cancer ([place]) 7 years ago   lymphoma, [place] (recent)  . Chronic kidney disease   . Colon polyps   . GERD (gastroesophageal reflux disease)   . History of kidney stones   . Parkinson disease [facility])     Past Surgical History:  Procedure Laterality Date  . COLON SURGERY    . CYSTOSCOPY WITH LITHOLAPAXY N/A 11/14/2016   Procedure: CYSTOSCOPY WITH LITHOLAPAXY;  Surgeon: Hollice Espy, MD;  Location: [facility] ORS;  Service: Urology;  Laterality: N/A;  . EYE SURGERY Bilateral    Cataract Extraction with IOL  . HERNIA REPAIR  20 years  . MOHS SURGERY    . SMALL INTESTINE SURGERY     Per patient 7 years  . TRANSURETHRAL RESECTION OF PROSTATE N/A 11/14/2016   Procedure: TRANSURETHRAL RESECTION OF THE PROSTATE (TURP);  Surgeon: Hollice Espy, MD;  Location: [facility] ORS;  Service: Urology;  Laterality: N/A;    There were no vitals filed for this visit.  Subjective Assessment - 01/05/19 1901    Subjective   Patient states, "I wasn't going to tell you but I fell yesterday coming out of the eye doctor." Patient reports his wife got off balance and leaned on him and they both fell.  Workers came out to help, patient was able to get up using his rollator to  hold onto.  He denies injury but has some edema and bruising on his small finger but able to demonstrate full fisting and range of motion.     Pertinent History  Patient reports he was diagnosed with Parkinson's disease about 10 plus years ago.  He reports a more recent decline in function in his daily activities in the last 6-12 months.  He has been seeing PT for the last couple months.    Patient Stated Goals  Patient reports he wants to be able to do more for himself and around the house.     Currently in Pain?  No/denies    Pain Score  0-No pain        Therapeutic Exercise: Patient seen for PROM to right shoulder followed by [facility] for shoulder flexion, ABD, diagonal patterns and reaching tasks in sitting.  Cues and guiding from therapist as needed.  Neuromuscular Reeducation: Patient seen for instruction of LSVT BIG exercises: LSVT Daily Session Maximal Daily Exercises:  Sustained movements are designed to rescale the amplitude of movement output for generalization  to daily functional activities. Performed as follows for 1 set of 10 repetitions each: Multi directional  sustained movements- 1) Floor to ceiling, 2) Side to side. Multi directional Repetitive  movements  performed in standing and are designed to provide retraining effort needed for sustained muscle  activation in tasks Performed as follows: 3) Step and reach forward, 4) Step and Reach Backwards,  5) Step and reach sideways, 6) Rock and reach forward/backward, 7) Rock and reach sideways.   All exercises performed in sitting with cues and therapist modeling.  Attempted standing to step in directions with min guard for balance and use of chair, moderate cues for arm positioning and size of step.  Response to tx: Patient continues to progress with exercises, he did report a fall yesterday but from the way he described it, his wife was off balance first and leaned onto him causing them both to tumble.  Patient does have some mild  edema in his small finger with bruising but still has full range of motion.  Recommended he try ice to help manage edema at home.  Continue to work towards goals to improve independence in lower body dressing, safe mobility to and from the bathroom and participation in daily exercises for balance, ROM, strength and flexibility.                    OT Education - 01/05/19 1904    Education provided  Yes    Education Details  RUE rom exercises    Person(s) Educated  Patient    Methods  Demonstration;Explanation;Verbal cues;Tactile cues    Comprehension  Verbal cues required;Returned demonstration;Verbalized understanding;Tactile cues required          OT Long Term Goals - 01/05/19 1203      OT LONG TERM GOAL #1   Title  Patient will demonstrate increase right hand grip to be able to cut food with modified independence.     Baseline  occasional assist required.    Time  12    Period  Weeks    Status  On-going      OT LONG TERM GOAL #2   Title  Patient will complete donning long sleeve shirt with modified independence.    Baseline  requires assist with long sleeves, can perform short sleeves.    Time  12    Period  Weeks    Status  Achieved      OT LONG TERM GOAL #3   Title  Patient will improve coordination to manage buttons on clothing with occasional assistance only.    Baseline  requires assist with buttons each time    Time  12    Period  Weeks    Status  On-going      OT LONG TERM GOAL #4   Title  Patient will demonstrate upright posture with shoulders back and head up during exercises with minimal cues.      Baseline  moderate cues for positioning during exercises.     Time  12    Period  Weeks    Status  On-going      OT LONG TERM GOAL #5   Title  Patient will demonstrate turning behaviors with rollator using large steps and keeping feet behind the rollator with all turns with occasional minimal cues.     Time  12    Period  Weeks    Status   On-going      OT LONG TERM GOAL #6   Title  Patient will improve gait speed and endurance and be able to walk 950 feet in 6 minutes to negotiate around the home and community safely in 12 weeks  Baseline  860 at reassessment, 324 at recert 4/01, 027 feet 11/28/3662    Time  4    Period  Weeks    Status  On-going      OT LONG TERM GOAL #7   Title  Patient will complete HEP for maximal daily exercises with modified independence in 4 weeks    Time  12    Period  Weeks    Status  On-going      OT LONG TERM GOAL #8   Title  Patient will transfer from sit to stand without the use of arms safely and independently from a variety of chairs/surfaces in 4 weeks.     Baseline  difficulty from lower surfaces    Time  4    Period  Weeks    Status  Achieved      OT LONG TERM GOAL  #9   Baseline  Patient will demonstrate floor transfers with use of chair to assist with min guard and cues if needed.     Time  12    Period  Weeks    Status  On-going            Plan - 01/05/19 1905    Clinical Impression Statement  Patient continues to progress with exercises, he did report a fall yesterday but from the way he described it, his wife was off balance first and leaned onto him causing them both to tumble.  Patient does have some mild edema in his small finger with bruising but still has full range of motion.  Recommended he try ice to help manage edema at home.  Continue to work towards goals to improve independence in lower body dressing, safe mobility to and from the bathroom and participation in daily exercises for balance, ROM, strength and flexibility.     Occupational Profile and client history currently impacting functional performance  progressive disease process, freezing of gait behaviors, fall risk    Occupational performance deficits (Please refer to evaluation for details):  ADL's;IADL's;Leisure    Body Structure / Function / Physical Skills   ADL;Dexterity;Flexibility;ROM;Strength;Balance;Coordination;IADL;FMC;Body mechanics;Endurance;Mobility;Pain;UE functional use;Decreased knowledge of use of DME    Cognitive Skills  Memory;Orientation;Sequencing;Safety Awareness    Psychosocial Skills  Coping Strategies;Environmental  Adaptations;Routines and Behaviors;Habits    Rehab Potential  Good    OT Frequency  2x / week    OT Treatment/Interventions  Self-care/ADL training;Moist Heat;DME and/or AE instruction;Balance training;Therapeutic activities;Therapeutic exercise;Neuromuscular education;Functional Mobility Training;Patient/family education;Cognitive remediation/compensation;Manual Therapy;Coping strategies training    Consulted and Agree with Plan of Care  Patient    Family Member Consulted  wife, Gordy Levan       Patient will benefit from skilled therapeutic intervention in order to improve the following deficits and impairments:  Body Structure / Function / Physical Skills, Psychosocial Skills, Cognitive Skills  Visit Diagnosis: Muscle weakness (generalized)  Chronic right shoulder pain  Other lack of coordination  Unsteadiness on feet  Abnormal posture    Problem List Patient Active Problem List   Diagnosis Date Noted  . Dementia due to Parkinson's disease without behavioral disturbance ([place]) 07/18/2017  . Neurogenic orthostatic hypotension ([place]) 12/02/2016  . BPH with urinary obstruction 11/14/2016  . Urinary frequency 10/10/2016  . Microscopic hematuria 10/10/2016  . Parkinson's syndrome ([place]) 06/14/2016  . Arthritis 06/14/2016  . Depression 06/14/2016  . Heartburn 06/14/2016  . Urinary incontinence 06/14/2016  . Skin lesion 06/14/2016  . Dementia ([place]) 06/14/2016  . Bilateral edema of lower extremity 06/14/2016   Amy  Oneita Jolly, OTR/L, CLT  Lovett,Amy 01/05/2019, 7:13 PM  [place] MAIN [facility] SERVICES [address] [place], [place], [address] Phone: [phone]   Fax:   [phone]  Name: Luke Durham MRN: [id number] Date of Birth: 1933-02-27

## 2019-01-05 NOTE — Therapy (Signed)
[place] MAIN [facility] SERVICES [address] [place], [place], [address] Phone: [phone]   Fax:  [phone]  Occupational Therapy Treatment  Patient Details  Name: Luke Durham MRN: [id number] Date of Birth: 05/07/1933 No data recorded  Encounter Date: 12/26/2018  OT End of Session - 01/05/19 1151    Visit Number  165    Number of Visits  180    Date for OT Re-Evaluation  01/11/19    OT Start Time  1101    OT Stop Time  1145    OT Time Calculation (min)  44 min    Activity Tolerance  Patient tolerated treatment well    Behavior During Therapy  [facility] for tasks assessed/performed;Flat affect       Past Medical History:  Diagnosis Date  . Anxiety   . Arthritis   . Asthma    childhood asthma  . Cancer ([place]) 7 years ago   lymphoma, [place] (recent)  . Chronic kidney disease   . Colon polyps   . GERD (gastroesophageal reflux disease)   . History of kidney stones   . Parkinson disease [facility])     Past Surgical History:  Procedure Laterality Date  . COLON SURGERY    . CYSTOSCOPY WITH LITHOLAPAXY N/A 11/14/2016   Procedure: CYSTOSCOPY WITH LITHOLAPAXY;  Surgeon: Hollice Espy, MD;  Location: [facility] ORS;  Service: Urology;  Laterality: N/A;  . EYE SURGERY Bilateral    Cataract Extraction with IOL  . HERNIA REPAIR  20 years  . MOHS SURGERY    . SMALL INTESTINE SURGERY     Per patient 7 years  . TRANSURETHRAL RESECTION OF PROSTATE N/A 11/14/2016   Procedure: TRANSURETHRAL RESECTION OF THE PROSTATE (TURP);  Surgeon: Hollice Espy, MD;  Location: [facility] ORS;  Service: Urology;  Laterality: N/A;    There were no vitals filed for this visit.  Subjective Assessment - 01/05/19 1150    Subjective   Patient reports he is doing well, glad to be back with OT to work on exercises.     Pertinent History  Patient reports he was diagnosed with Parkinson's disease about 10 plus years ago.  He reports a more recent decline in function in his daily  activities in the last 6-12 months.  He has been seeing PT for the last couple months.    Patient Stated Goals  Patient reports he wants to be able to do more for himself and around the house.     Currently in Pain?  Yes    Pain Score  4     Pain Location  Shoulder    Pain Orientation  Right    Pain Descriptors / Indicators  Aching    Pain Type  Chronic pain    Pain Onset  More than a month ago    Pain Frequency  Intermittent       Therapeutic Exercise:  Patient seen this date for ROM and strengthening bilateral UE with emphasis on right UE.  Dowel exercises with 1# dowel for shoulder flexion, ABD, ADD, chest press, forwards/backwards circles, diagonal patterns for 10 repetitions for 2 sets each. Verbal cues required for technique and therapist demonstration/modeling.    Neuromuscular Reeducation:  Focused on fine motor coordination skills with right UE to facilitate grasping/releasing and manipulation of small objects 1/2 inch in size.  Patient drops items frequently.  He reports difficulty with buttons at home and wife has to assist with this skill. Cues for prehension patterns, difficulty with  in hand manipulation skills, using the hand for storage and translatory movements of the hand.   Response to tx:  Patient continues to demonstrate bilateral UE muscle weakness and decreased ROM on the right with mild pain reported.  Able to complete exercises with modeling and verbal cues for form and technique.  Difficulty with manipulation skills for picking up and managing small objects. Continue to work towards goals in plan of care to increase safety and independence with necessary daily tasks.                     OT Education - 01/05/19 1150    Education provided  Yes    Education Details  daily exercises based on LSVT BIG from the past, ROM for right UE    Person(s) Educated  Patient    Methods  Demonstration;Explanation;Verbal cues;Tactile cues    Comprehension   Verbal cues required;Returned demonstration;Verbalized understanding;Tactile cues required          OT Long Term Goals - 01/05/19 1203      OT LONG TERM GOAL #1   Title  Patient will demonstrate increase right hand grip to be able to cut food with modified independence.     Baseline  occasional assist required.    Time  12    Period  Weeks    Status  On-going      OT LONG TERM GOAL #2   Title  Patient will complete donning long sleeve shirt with modified independence.    Baseline  requires assist with long sleeves, can perform short sleeves.    Time  12    Period  Weeks    Status  Achieved      OT LONG TERM GOAL #3   Title  Patient will improve coordination to manage buttons on clothing with occasional assistance only.    Baseline  requires assist with buttons each time    Time  12    Period  Weeks    Status  On-going      OT LONG TERM GOAL #4   Title  Patient will demonstrate upright posture with shoulders back and head up during exercises with minimal cues.      Baseline  moderate cues for positioning during exercises.     Time  12    Period  Weeks    Status  On-going      OT LONG TERM GOAL #5   Title  Patient will demonstrate turning behaviors with rollator using large steps and keeping feet behind the rollator with all turns with occasional minimal cues.     Time  12    Period  Weeks    Status  On-going      OT LONG TERM GOAL #6   Title  Patient will improve gait speed and endurance and be able to walk 950 feet in 6 minutes to negotiate around the home and community safely in 12 weeks    Baseline  860 at reassessment, 016 at recert 0/10, 932 feet 12/27/5730    Time  4    Period  Weeks    Status  On-going      OT LONG TERM GOAL #7   Title  Patient will complete HEP for maximal daily exercises with modified independence in 4 weeks    Time  12    Period  Weeks    Status  On-going      OT LONG TERM GOAL #8   Title  Patient will transfer from sit to stand without  the use of arms safely and independently from a variety of chairs/surfaces in 4 weeks.     Baseline  difficulty from lower surfaces    Time  4    Period  Weeks    Status  Achieved      OT LONG TERM GOAL  #9   Baseline  Patient will demonstrate floor transfers with use of chair to assist with min guard and cues if needed.     Time  12    Period  Weeks    Status  On-going            Plan - 01/05/19 1152    Clinical Impression Statement  Patient continues to demonstrate bilateral UE muscle weakness and decreased ROM on the right with mild pain reported.  Able to complete exercises with modeling and verbal cues for form and technique.  Difficulty with manipulation skills for picking up and managing small objects. Continue to work towards goals in plan of care to increase safety and independence with necessary daily tasks.     Occupational Profile and client history currently impacting functional performance  progressive disease process, freezing of gait behaviors, fall risk    Occupational performance deficits (Please refer to evaluation for details):  ADL's;IADL's;Leisure    Body Structure / Function / Physical Skills  ADL;Dexterity;Flexibility;ROM;Strength;Balance;Coordination;IADL;FMC;Body mechanics;Endurance;Mobility;Pain;UE functional use;Decreased knowledge of use of DME    Cognitive Skills  Memory;Orientation;Sequencing;Safety Awareness    Psychosocial Skills  Coping Strategies;Environmental  Adaptations;Routines and Behaviors;Habits    Rehab Potential  Good    OT Frequency  2x / week    OT Duration  12 weeks    OT Treatment/Interventions  Self-care/ADL training;Moist Heat;DME and/or AE instruction;Balance training;Therapeutic activities;Therapeutic exercise;Neuromuscular education;Functional Mobility Training;Patient/family education;Cognitive remediation/compensation;Manual Therapy;Coping strategies training    Consulted and Agree with Plan of Care  Patient       Patient will  benefit from skilled therapeutic intervention in order to improve the following deficits and impairments:  Body Structure / Function / Physical Skills, Psychosocial Skills, Cognitive Skills  Visit Diagnosis: Muscle weakness (generalized)  Difficulty in walking, not elsewhere classified  Other lack of coordination  Unsteadiness on feet    Problem List Patient Active Problem List   Diagnosis Date Noted  . Dementia due to Parkinson's disease without behavioral disturbance ([place]) 07/18/2017  . Neurogenic orthostatic hypotension ([place]) 12/02/2016  . BPH with urinary obstruction 11/14/2016  . Urinary frequency 10/10/2016  . Microscopic hematuria 10/10/2016  . Parkinson's syndrome ([place]) 06/14/2016  . Arthritis 06/14/2016  . Depression 06/14/2016  . Heartburn 06/14/2016  . Urinary incontinence 06/14/2016  . Skin lesion 06/14/2016  . Dementia ([place]) 06/14/2016  . Bilateral edema of lower extremity 06/14/2016   Achilles Dunk, OTR/L, CLT  , 01/05/2019, 12:04 PM  [place] MAIN [facility] SERVICES [address] [place], [place], [address] Phone: [phone]   Fax:  [phone]  Name: Omauri Boeve MRN: [id number] Date of Birth: 07-23-1933

## 2019-01-05 NOTE — Therapy (Signed)
[place] MAIN [facility] SERVICES [address] [place], [place], [address] Phone: [phone]   Fax:  [phone]  Occupational Therapy Treatment  Patient Details  Name: Luke Durham MRN: [id number] Date of Birth: 06-03-1933 No data recorded  Encounter Date: 12/31/2018  OT End of Session - 01/05/19 1846    Visit Number  166    Number of Visits  180    Date for OT Re-Evaluation  01/11/19    OT Start Time  1103    OT Stop Time  1200    OT Time Calculation (min)  57 min    Activity Tolerance  Patient tolerated treatment well    Behavior During Therapy  [facility] for tasks assessed/performed;Flat affect       Past Medical History:  Diagnosis Date  . Anxiety   . Arthritis   . Asthma    childhood asthma  . Cancer ([place]) 7 years ago   lymphoma, [place] (recent)  . Chronic kidney disease   . Colon polyps   . GERD (gastroesophageal reflux disease)   . History of kidney stones   . Parkinson disease [facility])     Past Surgical History:  Procedure Laterality Date  . COLON SURGERY    . CYSTOSCOPY WITH LITHOLAPAXY N/A 11/14/2016   Procedure: CYSTOSCOPY WITH LITHOLAPAXY;  Surgeon: Hollice Espy, MD;  Location: [facility] ORS;  Service: Urology;  Laterality: N/A;  . EYE SURGERY Bilateral    Cataract Extraction with IOL  . HERNIA REPAIR  20 years  . MOHS SURGERY    . SMALL INTESTINE SURGERY     Per patient 7 years  . TRANSURETHRAL RESECTION OF PROSTATE N/A 11/14/2016   Procedure: TRANSURETHRAL RESECTION OF THE PROSTATE (TURP);  Surgeon: Hollice Espy, MD;  Location: [facility] ORS;  Service: Urology;  Laterality: N/A;    There were no vitals filed for this visit.  Subjective Assessment - 01/05/19 1844    Subjective   Patient denies any recent falls.     Pertinent History  Patient reports he was diagnosed with Parkinson's disease about 10 plus years ago.  He reports a more recent decline in function in his daily activities in the last 6-12 months.  He has been  seeing PT for the last couple months.    Patient Stated Goals  Patient reports he wants to be able to do more for himself and around the house.     Currently in Pain?  Yes    Pain Location  Shoulder    Pain Orientation  Right    Pain Descriptors / Indicators  Aching    Pain Type  Chronic pain       ADL:  Patient seen for focus on manipulation of buttons for shirt.  Patient with moderate difficulty managing buttons, both with buttoning and unbuttoning.  Attempted metal and plastic buttons, a bit easier with the plastic ones.  Cues required for technique and prehension patterns.  Neuromuscular Reeducation:  Patient seen for LSVT BIG exercises from seated position as follows: Patient seen for instruction of LSVT BIG exercises: LSVT Daily Session Maximal Daily Exercises:  Sustained movements are designed to rescale the amplitude of movement output for generalization  to daily functional activities. Performed as follows for 1 set of 10 repetitions each: Multi directional  sustained movements- 1) Floor to ceiling, 2) Side to side. Multi directional Repetitive movements  performed in standing and are designed to provide retraining effort needed for sustained muscle  activation in tasks Performed as  follows: 3) Step and reach forward, 4) Step and Reach Backwards,  5) Step and reach sideways, 6) Rock and reach forward/backward, 7) Rock and reach sideways. Therapist modeling each exercise and requires min cues for proper form.   Reponse to treatment: Patient continues to progress, strength and balance impaired and exercises are being performed from a seated position.  Will continue to attempt to challenge and progress patient to be able to perform exercises again from adapted version.  Patient continues to demo difficulty with buttons, wife helps when needed but patient would like to be able to do it himself if possible.  Continue to work towards goals in plan of care to increase independence in daily  tasks.                     OT Education - 01/05/19 1846    Education provided  Yes    Education Details  manipulation of buttons    Person(s) Educated  Patient    Methods  Demonstration;Explanation;Verbal cues;Tactile cues    Comprehension  Verbal cues required;Returned demonstration;Verbalized understanding;Tactile cues required          OT Long Term Goals - 01/05/19 1203      OT LONG TERM GOAL #1   Title  Patient will demonstrate increase right hand grip to be able to cut food with modified independence.     Baseline  occasional assist required.    Time  12    Period  Weeks    Status  On-going      OT LONG TERM GOAL #2   Title  Patient will complete donning long sleeve shirt with modified independence.    Baseline  requires assist with long sleeves, can perform short sleeves.    Time  12    Period  Weeks    Status  Achieved      OT LONG TERM GOAL #3   Title  Patient will improve coordination to manage buttons on clothing with occasional assistance only.    Baseline  requires assist with buttons each time    Time  12    Period  Weeks    Status  On-going      OT LONG TERM GOAL #4   Title  Patient will demonstrate upright posture with shoulders back and head up during exercises with minimal cues.      Baseline  moderate cues for positioning during exercises.     Time  12    Period  Weeks    Status  On-going      OT LONG TERM GOAL #5   Title  Patient will demonstrate turning behaviors with rollator using large steps and keeping feet behind the rollator with all turns with occasional minimal cues.     Time  12    Period  Weeks    Status  On-going      OT LONG TERM GOAL #6   Title  Patient will improve gait speed and endurance and be able to walk 950 feet in 6 minutes to negotiate around the home and community safely in 12 weeks    Baseline  860 at reassessment, 761 at recert 6/07, 371 feet 0/03/2693    Time  4    Period  Weeks    Status  On-going       OT LONG TERM GOAL #7   Title  Patient will complete HEP for maximal daily exercises with modified independence in 4 weeks  Time  12    Period  Weeks    Status  On-going      OT LONG TERM GOAL #8   Title  Patient will transfer from sit to stand without the use of arms safely and independently from a variety of chairs/surfaces in 4 weeks.     Baseline  difficulty from lower surfaces    Time  4    Period  Weeks    Status  Achieved      OT LONG TERM GOAL  #9   Baseline  Patient will demonstrate floor transfers with use of chair to assist with min guard and cues if needed.     Time  12    Period  Weeks    Status  On-going            Plan - 01/05/19 1847    Clinical Impression Statement  Patient continues to progress, strength and balance impaired and exercises are being performed from a seated position.  Will continue to attempt to challenge and progress patient to be able to perform exercises again from adapted version.  Patient continues to demo difficulty with buttons, wife helps when needed but patient would like to be able to do it himself if possible.     Occupational Profile and client history currently impacting functional performance  progressive disease process, freezing of gait behaviors, fall risk    Occupational performance deficits (Please refer to evaluation for details):  ADL's;IADL's;Leisure    Body Structure / Function / Physical Skills  ADL;Dexterity;Flexibility;ROM;Strength;Balance;Coordination;IADL;FMC;Body mechanics;Endurance;Mobility;Pain;UE functional use;Decreased knowledge of use of DME    Cognitive Skills  Memory;Orientation;Sequencing;Safety Awareness    Psychosocial Skills  Coping Strategies;Environmental  Adaptations;Routines and Behaviors;Habits    Rehab Potential  Good    OT Frequency  2x / week    OT Duration  12 weeks    OT Treatment/Interventions  Self-care/ADL training;Moist Heat;DME and/or AE instruction;Balance training;Therapeutic  activities;Therapeutic exercise;Neuromuscular education;Functional Mobility Training;Patient/family education;Cognitive remediation/compensation;Manual Therapy;Coping strategies training    Consulted and Agree with Plan of Care  Patient       Patient will benefit from skilled therapeutic intervention in order to improve the following deficits and impairments:  Body Structure / Function / Physical Skills, Psychosocial Skills, Cognitive Skills  Visit Diagnosis: Muscle weakness (generalized)  Chronic right shoulder pain  Other lack of coordination  Unsteadiness on feet  Difficulty in walking, not elsewhere classified    Problem List Patient Active Problem List   Diagnosis Date Noted  . Dementia due to Parkinson's disease without behavioral disturbance ([place]) 07/18/2017  . Neurogenic orthostatic hypotension ([place]) 12/02/2016  . BPH with urinary obstruction 11/14/2016  . Urinary frequency 10/10/2016  . Microscopic hematuria 10/10/2016  . Parkinson's syndrome ([place]) 06/14/2016  . Arthritis 06/14/2016  . Depression 06/14/2016  . Heartburn 06/14/2016  . Urinary incontinence 06/14/2016  . Skin lesion 06/14/2016  . Dementia ([place]) 06/14/2016  . Bilateral edema of lower extremity 06/14/2016   Achilles Dunk, OTR/L, CLT  Lovett,Amy 01/05/2019, 6:59 PM  [place] MAIN [facility] SERVICES [address] [place], [place], [address] Phone: [phone]   Fax:  [phone]  Name: Luke Durham MRN: [id number] Date of Birth: [birth year]

## 2019-01-07 ENCOUNTER — Ambulatory Visit: Payer: Medicare Other

## 2019-01-07 ENCOUNTER — Ambulatory Visit: Payer: Medicare Other | Admitting: Physical Therapy

## 2019-01-07 ENCOUNTER — Ambulatory Visit: Payer: Medicare Other | Admitting: Occupational Therapy

## 2019-01-09 ENCOUNTER — Ambulatory Visit: Payer: Medicare Other

## 2019-01-09 ENCOUNTER — Ambulatory Visit: Payer: Medicare Other | Admitting: Physical Therapy

## 2019-01-09 ENCOUNTER — Ambulatory Visit: Payer: Medicare Other | Admitting: Occupational Therapy

## 2019-01-15 ENCOUNTER — Ambulatory Visit: Payer: Medicare Other | Admitting: Physical Therapy

## 2019-01-15 ENCOUNTER — Encounter: Payer: Medicare Other | Admitting: Occupational Therapy

## 2019-01-15 NOTE — Therapy (Signed)
[place] MAIN [facility] SERVICES [address] [place], [place], [address] Phone: [phone]   Fax:  [phone]  Patient Details  Name: Luke Durham MRN: [id number] Date of Birth: 28-May-1933 Referring Provider:  No ref. provider found  Encounter Date: 01/15/2019   DPT called patient to let patient know the status of the clinic and ensure that we will be reaching out to schedule when we re-open. DPT fielded any questions patient had at this time and offered guidance on current home program as necessary. Spoke with patient's wife, who was requesting that Garlon Hatchet, OT would please give her a call, PT agreed to pass the message on.    Lieutenant Diego PT, DPT 10:30 AM,01/15/19 [place] MAIN [facility] SERVICES [address] [place], [place], [address] Phone: [phone]   Fax:  [phone]

## 2019-01-16 ENCOUNTER — Ambulatory Visit: Payer: Medicare Other | Admitting: Occupational Therapy

## 2019-01-17 ENCOUNTER — Encounter: Payer: Medicare Other | Admitting: Occupational Therapy

## 2019-01-17 ENCOUNTER — Ambulatory Visit: Payer: Medicare Other | Admitting: Physical Therapy

## 2019-01-18 ENCOUNTER — Encounter: Payer: Medicare Other | Admitting: Occupational Therapy

## 2019-01-22 ENCOUNTER — Encounter: Payer: Medicare Other | Admitting: Occupational Therapy

## 2019-01-22 ENCOUNTER — Ambulatory Visit: Payer: Medicare Other | Admitting: Physical Therapy

## 2019-01-24 ENCOUNTER — Ambulatory Visit: Payer: Medicare Other | Admitting: Physical Therapy

## 2019-01-24 ENCOUNTER — Encounter: Payer: Medicare Other | Admitting: Occupational Therapy

## 2019-01-29 ENCOUNTER — Encounter: Payer: Medicare Other | Admitting: Occupational Therapy

## 2019-01-29 ENCOUNTER — Ambulatory Visit: Payer: Medicare Other | Admitting: Physical Therapy

## 2019-01-31 ENCOUNTER — Ambulatory Visit: Payer: Medicare Other | Admitting: Physical Therapy

## 2019-01-31 ENCOUNTER — Encounter: Payer: Medicare Other | Admitting: Occupational Therapy

## 2019-01-31 NOTE — Therapy (Signed)
[place] MAIN [facility] SERVICES [address] [place], [place], [address] Phone: [phone]   Fax:  [phone]  Patient Details  Name: Luke Durham MRN: [id number] Date of Birth: April 18, 1933 Referring Provider:  No ref. provider found  Encounter Date: 01/31/2019   The [facility] outpatient clinics are closed at this time due to the COVID-19 epidemic. Upon consideration of patient's status and needs, the referring physician is being contacted to request a home health referral in order to help provide continued rehabilitation services care for the patient.   Lieutenant Diego PT, DPT 4:00 PM,01/31/19 [place] MAIN [facility] SERVICES [address] [place], [place], [address] Phone: [phone]   Fax:  [phone]

## 2019-01-31 NOTE — Therapy (Signed)
[place] MAIN [facility] SERVICES [address] [place], [place], [address] Phone: [phone]   Fax:  [phone]  Patient Details  Name: Luke Durham MRN: [id number] Date of Birth: [birth year] Referring Provider:  No ref. provider found  Encounter Date: 01/31/2019  Dr. Manuella Ghazi, [name] was referred for outpatient physical therapy services.  Due to COVID-19, it will be more appropriate at this time to refer for home health therapy services.  The patient is a high fall risk is not an appropriate candidate for Telehealth at this time but would benefit from continued physical therapy services.   Thank you, Lieutenant Diego PT, DPT [phone] PM,01/31/19 [place] MAIN [facility] SERVICES [address] [place], [place], [address] Phone: [phone]   Fax:  [phone]

## 2019-02-01 ENCOUNTER — Other Ambulatory Visit: Payer: Self-pay | Admitting: Physician Assistant

## 2019-02-01 DIAGNOSIS — Z87448 Personal history of other diseases of urinary system: Secondary | ICD-10-CM

## 2019-02-01 NOTE — Telephone Encounter (Signed)
Please Review. Last visit 02/2018.

## 2019-02-05 ENCOUNTER — Ambulatory Visit: Payer: Medicare Other

## 2019-02-05 ENCOUNTER — Ambulatory Visit: Payer: Medicare Other | Admitting: Physical Therapy

## 2019-02-05 ENCOUNTER — Encounter: Payer: Medicare Other | Admitting: Occupational Therapy

## 2019-02-07 ENCOUNTER — Ambulatory Visit: Payer: Medicare Other | Admitting: Physical Therapy

## 2019-02-07 ENCOUNTER — Encounter: Payer: Medicare Other | Admitting: Occupational Therapy

## 2019-02-11 ENCOUNTER — Ambulatory Visit: Payer: Medicare Other | Admitting: Physical Therapy

## 2019-02-11 ENCOUNTER — Encounter: Payer: Medicare Other | Admitting: Occupational Therapy

## 2019-02-12 ENCOUNTER — Ambulatory Visit: Payer: Self-pay | Admitting: Occupational Therapy

## 2019-02-12 DIAGNOSIS — G20 Parkinson's disease: Secondary | ICD-10-CM

## 2019-02-12 NOTE — Therapy (Signed)
[place] MAIN [facility] SERVICES [address] [place], [place], [address] Phone: [phone]   Fax:  [phone]  Patient Details  Name: Luke Durham MRN: [id number] Date of Birth: [birth year] Referring Provider:  No ref. provider found  Encounter Date: 02/12/2019      Dr. Manuella Ghazi, [name] was referred for outpatient cooupational therapy services.  Due to COVID-19, it will be more appropriate at this time to refer for home health therapy services.  The patient is a high fall risk is not an appropriate candidate for Telehealth at this time but would benefit from continued occupational therapy services. Physical Therapy also send a recommendation for home health earlier.   The patient has not heard from your office regarding the referral and setup, please follow up.      Lovett,Amy 02/12/2019, 2:20 PM  [place] MAIN [facility] SERVICES [address] [place], [place], [address] Phone: [phone]   Fax:  [phone]

## 2019-02-14 ENCOUNTER — Encounter: Payer: Medicare Other | Admitting: Occupational Therapy

## 2019-02-14 ENCOUNTER — Ambulatory Visit: Payer: Medicare Other | Admitting: Physical Therapy

## 2019-02-19 ENCOUNTER — Ambulatory Visit: Payer: Medicare Other | Admitting: Physical Therapy

## 2019-02-19 ENCOUNTER — Encounter: Payer: Medicare Other | Admitting: Occupational Therapy

## 2019-02-21 ENCOUNTER — Ambulatory Visit: Payer: Medicare Other | Admitting: Physical Therapy

## 2019-02-21 ENCOUNTER — Encounter: Payer: Medicare Other | Admitting: Occupational Therapy

## 2019-03-13 ENCOUNTER — Ambulatory Visit (INDEPENDENT_AMBULATORY_CARE_PROVIDER_SITE_OTHER): Payer: Medicare Other

## 2019-03-13 ENCOUNTER — Other Ambulatory Visit: Payer: Self-pay

## 2019-03-13 DIAGNOSIS — Z0000 Encounter for general adult medical examination without abnormal findings: Secondary | ICD-10-CM | POA: Diagnosis not present

## 2019-03-13 NOTE — Progress Notes (Signed)
Subjective:   Isaish Alemu is a 85 y.o. male who presents for Medicare Annual/Subsequent preventive examination.    This visit is being conducted through telemedicine due to the COVID-19 pandemic. This patient has given me verbal consent via doximity to conduct this visit, patient states they are participating from their home address. Some vital signs may be absent or patient reported.    Patient identification: identified by name, DOB, and current address  Review of Systems:  N/A  Cardiac Risk Factors include: advanced age (>14men, >88 women);male gender     Objective:    Vitals: There were no vitals taken for this visit.  There is no height or weight on file to calculate BMI. Unable to obtain vitals due to visit being conducted via telephonically.   Advanced Directives 03/13/2019 11/07/2018 03/08/2018 02/10/2017 11/14/2016 11/04/2016 10/12/2016  Does Patient Have a Medical Advance Directive? Yes Yes Yes Yes Yes Yes Yes  Type of Paramedic of [place];Living will [place];Living will [place];Living will [place];Living will Healthcare Power of [place];Living will [place];Living will  Does patient want to make changes to medical advance directive? - No - Patient declined - - No - Patient declined No - Patient declined Yes (MAU/Ambulatory/Procedural Areas - Information given)  Copy of [place] in Chart? No - copy requested No - copy requested No - copy requested No - copy requested No - copy requested No - copy requested No - copy requested    Tobacco Social History   Tobacco Use  Smoking Status Never Smoker  Smokeless Tobacco Never Used     Counseling given: Not Answered   Clinical Intake:  Pre-visit preparation completed: Yes  Pain : No/denies pain Pain Score: 0-No pain     Nutritional Status: BMI 25 -29 Overweight  Nutritional Risks: None Diabetes: No  How often do you need to have someone help you when you read instructions, pamphlets, or other written materials from your doctor or pharmacy?: 1 - Never  Interpreter Needed?: No  Information entered by :: [facility], LPN  Past Medical History:  Diagnosis Date  . Anxiety   . Arthritis   . Asthma    childhood asthma  . Cancer ([place]) 7 years ago   lymphoma, [place] (recent)  . Chronic kidney disease   . Colon polyps   . GERD (gastroesophageal reflux disease)   . History of kidney stones   . Parkinson disease [facility])    Past Surgical History:  Procedure Laterality Date  . COLON SURGERY    . CYSTOSCOPY WITH LITHOLAPAXY N/A 11/14/2016   Procedure: CYSTOSCOPY WITH LITHOLAPAXY;  Surgeon: Hollice Espy, MD;  Location: [facility] ORS;  Service: Urology;  Laterality: N/A;  . EYE SURGERY Bilateral    Cataract Extraction with IOL  . HERNIA REPAIR  20 years  . MOHS SURGERY    . SMALL INTESTINE SURGERY     Per patient 7 years  . TRANSURETHRAL RESECTION OF PROSTATE N/A 11/14/2016   Procedure: TRANSURETHRAL RESECTION OF THE PROSTATE (TURP);  Surgeon: Hollice Espy, MD;  Location: [facility] ORS;  Service: Urology;  Laterality: N/A;   Family History  Problem Relation Age of Onset  . Heart disease Father   . Bladder Cancer Neg Hx   . Prostate cancer Neg Hx   . Kidney cancer Neg Hx    Social History   Socioeconomic History  . Marital status: Married  Spouse name: Not on file  . Number of children: 2  . Years of education: Not on file  . Highest education level: Bachelor's degree (e.g., BA, AB, BS)  Occupational History  . Occupation: retired  Scientific laboratory technician  . Financial resource strain: Not hard at all  . Food insecurity:    Worry: Never true    Inability: Never true  . Transportation needs:    Medical: No    Non-medical: No  Tobacco Use  . Smoking status: Never Smoker  . Smokeless tobacco: Never Used  Substance and Sexual Activity  . Alcohol use: Yes     Alcohol/week: 0.0 - 7.0 standard drinks  . Drug use: No  . Sexual activity: Not on file  Lifestyle  . Physical activity:    Days per week: 0 days    Minutes per session: 0 min  . Stress: Only a little  Relationships  . Social connections:    Talks on phone: Patient refused    Gets together: Patient refused    Attends religious service: Patient refused    Active member of club or organization: Patient refused    Attends meetings of clubs or organizations: Patient refused    Relationship status: Patient refused  Other Topics Concern  . Not on file  Social History Narrative  . Not on file    Outpatient Encounter Medications as of 03/13/2019  Medication Sig  . Amantadine HCl 100 MG tablet Take 100 mg by mouth daily.  Marland Kitchen aspirin 81 MG tablet Take 81 mg by mouth daily.  . carbidopa-levodopa (SINEMET CR) 50-200 MG tablet Take 1 tablet by mouth 2 (two) times daily. With the Aricept  . carbidopa-levodopa (SINEMET IR) 25-250 MG tablet Take 2 tablets by mouth 4 (four) times daily.  . Carboxymethylcellulose Sodium (THERATEARS OP) Place 1-2 drops into both eyes at bedtime.  . cholecalciferol (VITAMIN D) 1000 units tablet Take 1,000 Units by mouth daily.  Marland Kitchen DOK 100 MG capsule TAKE 1 CAPSULE(100 MG) BY MOUTH TWICE DAILY (Patient taking differently: 100 mg 2 (two) times daily. Docusate sodium)  . donepezil (ARICEPT) 10 MG tablet Take 10 mg by mouth at bedtime.   Marland Kitchen econazole nitrate 1 % cream Apply 1 application topically daily. Applied to feet  . finasteride (PROSCAR) 5 MG tablet Take 1 tablet (5 mg total) by mouth daily.  . midodrine (PROAMATINE) 2.5 MG tablet Take 2.5 mg by mouth 3 (three) times daily with meals.  . Multiple Vitamin (MULTIVITAMIN) capsule Take 1 capsule by mouth daily.  . Multiple Vitamins-Minerals (OCUVITE PO) Take 1 tablet by mouth daily.   Debby Freiberg Tartrate (NUPLAZID) 34 MG CAPS Take 34 mg by mouth daily.  Marland Kitchen rOPINIRole (REQUIP) 0.25 MG tablet Take 0.25 mg by mouth  3 (three) times daily.  . sertraline (ZOLOFT) 50 MG tablet TAKE 1 TABLET BY MOUTH EVERY DAY (Patient taking differently: Take 100 mg by mouth daily. )  . carbidopa-levodopa (SINEMET IR) 25-100 MG tablet take 2 tablets by mouth four times a day as directed (Patient not taking: Reported on 03/13/2019)  . furosemide (LASIX) 20 MG tablet take 1 tablet by mouth once daily (Patient not taking: Reported on 03/13/2019)  . niacinamide 500 MG tablet   . nitrofurantoin, macrocrystal-monohydrate, (MACROBID) 100 MG capsule Take 1 capsule (100 mg total) by mouth every 12 (twelve) hours. (Patient not taking: Reported on 03/13/2019)  . Pimavanserin Tartrate (NUPLAZID) 17 MG TABS Take 17 mg by mouth daily.    No facility-administered encounter medications  on file as of 03/13/2019.     Activities of Daily Living In your present state of health, do you have any difficulty performing the following activities: 03/13/2019  Hearing? N  Vision? N  Comment Wears eye glasses daily.   Difficulty concentrating or making decisions? N  Walking or climbing stairs? Y  Comment Due to Parkinsons.   Dressing or bathing? N  Doing errands, shopping? Y  Comment Does not drive.   Preparing Food and eating ? N  Using the Toilet? N  In the past six months, have you accidently leaked urine? Y  Comment Due to Parkinsons- wears protection.   Do you have problems with loss of bowel control? N  Managing your Medications? Y  Comment Wife manages medications.   Managing your Finances? Y  Comment Wife manages finances.   Housekeeping or managing your Housekeeping? N  Comment Wife does housework.   Some recent data might be hidden    Patient Care Team: Mar Daring, PA-C as PCP - General (Family Medicine) Vladimir Crofts, MD as Consulting Physician (Neurology) Leandrew Koyanagi, MD as Referring Physician (Ophthalmology) Poggi, Marshall Cork, MD as Consulting Physician (Surgery) Merril Abbe, MD as Referring Physician  (Dermatology) Hollice Espy, MD as Consulting Physician (Urology) Dasher, Rayvon Char, MD as Consulting Physician (Dermatology) Gayland Curry Hubbard Hartshorn (Neurology)   Assessment:   This is a routine wellness examination for [place].  Exercise Activities and Dietary recommendations Current Exercise Habits: The patient does not participate in regular exercise at present, Exercise limited by: Other - see comments(Due to Parkinsons)  Goals    . DIET - INCREASE WATER INTAKE     Recommend increasing water intake to 4-6 glasses a day.     . Increase water intake     Recommend increasing water intake 4-6 glasses of water a day.    Marland Kitchen LIFESTYLE - DECREASE FALLS RISK     Recommend to remove any items from the home that may cause slips or trips.       Fall Risk: Fall Risk  03/13/2019 03/08/2018 02/10/2017  Falls in the past year? 1 Yes Yes  Number falls in past yr: 1 2 or more 2 or more  Injury with Fall? 0 No No  Risk for fall due to : - - Impaired mobility  Follow up Falls prevention discussed Falls prevention discussed Falls prevention discussed    FALL RISK PREVENTION PERTAINING TO THE HOME:  Any stairs in or around the home? Yes  If so, are there any without handrails? Yes   Home free of loose throw rugs in walkways, pet beds, electrical cords, etc? Yes  Adequate lighting in your home to reduce risk of falls? Yes   ASSISTIVE DEVICES UTILIZED TO PREVENT FALLS:  Life alert? No  Use of a cane, walker or w/c? Yes  Grab bars in the bathroom? Yes  Shower chair or bench in shower? Yes  Elevated toilet seat or a handicapped toilet? Yes   TIMED UP AND GO:  Was the test performed? No .    Depression Screen PHQ 2/9 Scores 03/13/2019 03/08/2018 02/10/2017  PHQ - 2 Score 2 4 3   PHQ- 9 Score 11 14 16     Cognitive Function: Declined screening today.      6CIT Screen 02/10/2017  What Year? 0 points  What month? 0 points  What time? 0 points  Count back from 20 0 points  Months in  reverse 4 points  Repeat phrase 2 points  Total Score 6    Immunization History  Administered Date(s) Administered  . Influenza, High Dose Seasonal PF 07/09/2018  . Influenza-Unspecified 07/24/2016  . Pneumococcal Conjugate-13 12/16/2016  . Pneumococcal Polysaccharide-23 01/09/2018  . Zoster Recombinat (Shingrix) 07/18/2017, 01/29/2018    Qualifies for Shingles Vaccine? Up to date  Tdap: Although this vaccine is not a covered service during a Wellness Exam, does the patient still wish to receive this vaccine today?  No .   Advised may receive this vaccine at local pharmacy or Health Dept. Aware to provide a copy of the vaccination record if obtained from local pharmacy or Health Dept. Verbalized acceptance and understanding.  Flu Vaccine: Up to date  Pneumococcal Vaccine: Up to date  Screening Tests Health Maintenance  Topic Date Due  . TETANUS/TDAP  10/24/2026 (Originally 03/24/1952)  . INFLUENZA VACCINE  05/25/2019  . PNA vac Low Risk Adult  Completed   Cancer Screenings:  Colorectal Screening: No longer required.   Lung Cancer Screening: (Low Dose CT Chest recommended if Age 32-80 years, 30 pack-year currently smoking OR have quit w/in 15years.) does not qualify.   Additional Screening:  Vision Screening: Recommended annual ophthalmology exams for early detection of glaucoma and other disorders of the eye.  Dental Screening: Recommended annual dental exams for proper oral hygiene  Community Resource Referral:  CRR required this visit?  No        Plan:  I have personally reviewed and addressed the Medicare Annual Wellness questionnaire and have noted the following in the patient's chart:  A. Medical and social history B. Use of alcohol, tobacco or illicit drugs  C. Current medications and supplements D. Functional ability and status E.  Nutritional status F.  Physical activity G. Advance directives H. List of other physicians I.  Hospitalizations, surgeries,  and ER visits in previous 12 months J.  [place] such as hearing and vision if needed, cognitive and depression L. Referrals and appointments   In addition, I have reviewed and discussed with patient certain preventive protocols, quality metrics, and best practice recommendations. A written personalized care plan for preventive services as well as general preventive health recommendations were provided to patient.   Glendora Score, LPN  7/82/9562 Nurse Health Advisor   Nurse Notes: None.

## 2019-03-13 NOTE — Patient Instructions (Addendum)
Luke Durham , Thank you for taking time to come for your Medicare Wellness Visit. I appreciate your ongoing commitment to your health goals. Please review the following plan we discussed and let me know if I can assist you in the future.   Screening recommendations/referrals: Colonoscopy: No longer required.  Recommended yearly ophthalmology/optometry visit for glaucoma screening and checkup Recommended yearly dental visit for hygiene and checkup  Vaccinations: Influenza vaccine: Up to date Pneumococcal vaccine: Completed series Tdap vaccine: Pt declines today.  Shingles vaccine: Completed series    Advanced directives: Please bring a copy of your POA (Power of Attorney) and/or Living Will to your next appointment.   Conditions/risks identified: Fall risk prevention discussed with you today. Recommend to increase water intake to 6-8 8 oz glasses a day.   Next appointment: 06/24/19 @ 11:00 AM with Fenton Malling.   Preventive Care 34 Years and Older, Male Preventive care refers to lifestyle choices and visits with your health care provider that can promote health and wellness. What does preventive care include?  A yearly physical exam. This is also called an annual well check.  Dental exams once or twice a year.  Routine eye exams. Ask your health care provider how often you should have your eyes checked.  Personal lifestyle choices, including:  Daily care of your teeth and gums.  Regular physical activity.  Eating a healthy diet.  Avoiding tobacco and drug use.  Limiting alcohol use.  Practicing safe sex.  Taking low doses of aspirin every day.  Taking vitamin and mineral supplements as recommended by your health care provider. What happens during an annual well check? The services and screenings done by your health care provider during your annual well check will depend on your age, overall health, lifestyle risk factors, and family history of disease.  Counseling  Your health care provider may ask you questions about your:  Alcohol use.  Tobacco use.  Drug use.  Emotional well-being.  Home and relationship well-being.  Sexual activity.  Eating habits.  History of falls.  Memory and ability to understand (cognition).  Work and work Statistician. Screening  You may have the following tests or measurements:  Height, weight, and BMI.  Blood pressure.  Lipid and cholesterol levels. These may be checked every 5 years, or more frequently if you are over 38 years old.  Skin check.  Lung cancer screening. You may have this screening every year starting at age 32 if you have a 30-pack-year history of smoking and currently smoke or have quit within the past 15 years.  Fecal occult blood test (FOBT) of the stool. You may have this test every year starting at age 48.  Flexible sigmoidoscopy or colonoscopy. You may have a sigmoidoscopy every 5 years or a colonoscopy every 10 years starting at age 54.  Prostate cancer screening. Recommendations will vary depending on your family history and other risks.  Hepatitis C blood test.  Hepatitis B blood test.  Sexually transmitted disease (STD) testing.  Diabetes screening. This is done by checking your blood sugar (glucose) after you have not eaten for a while (fasting). You may have this done every 1-3 years.  Abdominal aortic aneurysm (AAA) screening. You may need this if you are a current or former smoker.  Osteoporosis. You may be screened starting at age 74 if you are at high risk. Talk with your health care provider about your test results, treatment options, and if necessary, the need for more tests. Vaccines  Your  health care provider may recommend certain vaccines, such as:  Influenza vaccine. This is recommended every year.  Tetanus, diphtheria, and acellular pertussis (Tdap, Td) vaccine. You may need a Td booster every 10 years.  Zoster vaccine. You may need this  after age 59.  Pneumococcal 13-valent conjugate (PCV13) vaccine. One dose is recommended after age 80.  Pneumococcal polysaccharide (PPSV23) vaccine. One dose is recommended after age 24. Talk to your health care provider about which screenings and vaccines you need and how often you need them. This information is not intended to replace advice given to you by your health care provider. Make sure you discuss any questions you have with your health care provider. Document Released: 11/06/2015 Document Revised: 06/29/2016 Document Reviewed: 08/11/2015 Elsevier Interactive Patient Education  2017 [place] Prevention in the Home Falls can cause injuries. They can happen to people of all ages. There are many things you can do to make your home safe and to help prevent falls. What can I do on the outside of my home?  Regularly fix the edges of walkways and driveways and fix any cracks.  Remove anything that might make you trip as you walk through a door, such as a raised step or threshold.  Trim any bushes or trees on the path to your home.  Use bright outdoor lighting.  Clear any walking paths of anything that might make someone trip, such as rocks or tools.  Regularly check to see if handrails are loose or broken. Make sure that both sides of any steps have handrails.  Any raised decks and porches should have guardrails on the edges.  Have any leaves, snow, or ice cleared regularly.  Use sand or salt on walking paths during winter.  Clean up any spills in your garage right away. This includes oil or grease spills. What can I do in the bathroom?  Use night lights.  Install grab bars by the toilet and in the tub and shower. Do not use towel bars as grab bars.  Use non-skid mats or decals in the tub or shower.  If you need to sit down in the shower, use a plastic, non-slip stool.  Keep the floor dry. Clean up any water that spills on the floor as soon as it happens.   Remove soap buildup in the tub or shower regularly.  Attach bath mats securely with double-sided non-slip rug tape.  Do not have throw rugs and other things on the floor that can make you trip. What can I do in the bedroom?  Use night lights.  Make sure that you have a light by your bed that is easy to reach.  Do not use any sheets or blankets that are too big for your bed. They should not hang down onto the floor.  Have a firm chair that has side arms. You can use this for support while you get dressed.  Do not have throw rugs and other things on the floor that can make you trip. What can I do in the kitchen?  Clean up any spills right away.  Avoid walking on wet floors.  Keep items that you use a lot in easy-to-reach places.  If you need to reach something above you, use a strong step stool that has a grab bar.  Keep electrical cords out of the way.  Do not use floor polish or wax that makes floors slippery. If you must use wax, use non-skid floor wax.  Do not  have throw rugs and other things on the floor that can make you trip. What can I do with my stairs?  Do not leave any items on the stairs.  Make sure that there are handrails on both sides of the stairs and use them. Fix handrails that are broken or loose. Make sure that handrails are as long as the stairways.  Check any carpeting to make sure that it is firmly attached to the stairs. Fix any carpet that is loose or worn.  Avoid having throw rugs at the top or bottom of the stairs. If you do have throw rugs, attach them to the floor with carpet tape.  Make sure that you have a light switch at the top of the stairs and the bottom of the stairs. If you do not have them, ask someone to add them for you. What else can I do to help prevent falls?  Wear shoes that:  Do not have high heels.  Have rubber bottoms.  Are comfortable and fit you well.  Are closed at the toe. Do not wear sandals.  If you use a  stepladder:  Make sure that it is fully opened. Do not climb a closed stepladder.  Make sure that both sides of the stepladder are locked into place.  Ask someone to hold it for you, if possible.  Clearly mark and make sure that you can see:  Any grab bars or handrails.  First and last steps.  Where the edge of each step is.  Use tools that help you move around (mobility aids) if they are needed. These include:  Canes.  Walkers.  Scooters.  Crutches.  Turn on the lights when you go into a dark area. Replace any light bulbs as soon as they burn out.  Set up your furniture so you have a clear path. Avoid moving your furniture around.  If any of your floors are uneven, fix them.  If there are any pets around you, be aware of where they are.  Review your medicines with your doctor. Some medicines can make you feel dizzy. This can increase your chance of falling. Ask your doctor what other things that you can do to help prevent falls. This information is not intended to replace advice given to you by your health care provider. Make sure you discuss any questions you have with your health care provider. Document Released: 08/06/2009 Document Revised: 03/17/2016 Document Reviewed: 11/14/2014 Elsevier Interactive Patient Education  2017 [employer].

## 2019-03-19 ENCOUNTER — Ambulatory Visit: Payer: Medicare Other | Admitting: Physical Therapy

## 2019-03-21 ENCOUNTER — Ambulatory Visit: Payer: Medicare Other | Admitting: Occupational Therapy

## 2019-03-26 ENCOUNTER — Ambulatory Visit: Payer: Medicare Other | Admitting: Physical Therapy

## 2019-03-28 ENCOUNTER — Ambulatory Visit: Payer: Medicare Other | Admitting: Occupational Therapy

## 2019-04-01 ENCOUNTER — Ambulatory Visit: Payer: Medicare Other | Admitting: Occupational Therapy

## 2019-04-03 ENCOUNTER — Ambulatory Visit: Payer: Medicare Other | Admitting: Physical Therapy

## 2019-04-08 ENCOUNTER — Ambulatory Visit: Payer: Medicare Other | Admitting: Occupational Therapy

## 2019-04-10 ENCOUNTER — Ambulatory Visit: Payer: Medicare Other | Admitting: Physical Therapy

## 2019-04-15 ENCOUNTER — Ambulatory Visit: Payer: Medicare Other | Admitting: Occupational Therapy

## 2019-04-17 ENCOUNTER — Ambulatory Visit: Payer: Medicare Other | Admitting: Physical Therapy

## 2019-04-22 ENCOUNTER — Ambulatory Visit: Payer: Medicare Other | Admitting: Occupational Therapy

## 2019-04-24 ENCOUNTER — Ambulatory Visit: Payer: Medicare Other | Admitting: Physical Therapy

## 2019-04-29 ENCOUNTER — Ambulatory Visit: Payer: Medicare Other | Admitting: Occupational Therapy

## 2019-05-01 ENCOUNTER — Ambulatory Visit: Payer: Medicare Other | Admitting: Physical Therapy

## 2019-05-06 ENCOUNTER — Ambulatory Visit: Payer: Medicare Other | Admitting: Occupational Therapy

## 2019-05-08 ENCOUNTER — Ambulatory Visit: Payer: Medicare Other | Admitting: Physical Therapy

## 2019-05-13 ENCOUNTER — Ambulatory Visit: Payer: Medicare Other | Admitting: Occupational Therapy

## 2019-05-15 ENCOUNTER — Ambulatory Visit: Payer: Medicare Other | Admitting: Physical Therapy

## 2019-05-20 ENCOUNTER — Ambulatory Visit: Payer: Medicare Other | Admitting: Occupational Therapy

## 2019-05-22 ENCOUNTER — Ambulatory Visit: Payer: Medicare Other | Admitting: Physical Therapy

## 2019-05-27 ENCOUNTER — Ambulatory Visit: Payer: Medicare Other | Admitting: Occupational Therapy

## 2019-05-29 ENCOUNTER — Ambulatory Visit: Payer: Medicare Other | Admitting: Physical Therapy

## 2019-06-24 ENCOUNTER — Encounter: Payer: Medicare Other | Admitting: Physician Assistant

## 2019-06-26 NOTE — Progress Notes (Signed)
Patient: Luke Durham, Male    DOB: 1932/11/14, 85 y.o.   MRN: [id number] Visit Date: 06/27/2019  Today's Provider: Mar Daring, PA-C   Chief Complaint  Patient presents with  . Medicare Wellness   Subjective:   Patient has AWV with [facility] 03/13/2019.   Complete Physical Luke Durham is a 85 y.o. male. He feels poorly. He reports exercising none. He reports he is sleeping fairly well.  He does report having worsening hallucinations and feels cold all the time. Hallucinations are normally 4 men standing in the room he is in. Does not happen daily. Does notice on days he is more active and "is using his brain" the hallucinations are less frequent.  Cold intolerance has been worsening. Feels cold all the time.   Also of note BP was extremely low today. Patient denies any weakness, dizziness, lightheadedness. He reports compliance with medications, including Midodrine and has taken his first dose this morning.  -----------------------------------------------------------   Review of Systems  Constitutional: Positive for activity change.  HENT: Positive for drooling and trouble swallowing.   Eyes: Positive for visual disturbance.  Respiratory: Negative.   Cardiovascular: Negative.   Gastrointestinal: Positive for constipation.  Endocrine: Positive for cold intolerance.  Genitourinary: Positive for enuresis.  Musculoskeletal: Positive for arthralgias.  Skin: Negative.   Allergic/Immunologic: Negative.   Neurological: Positive for speech difficulty and weakness. Negative for dizziness, light-headedness, numbness and headaches.  Hematological: Bruises/bleeds easily.  Psychiatric/Behavioral: Positive for confusion, decreased concentration and hallucinations. The patient is nervous/anxious.   Positives are from Parkinson's  Social History   Socioeconomic History  . Marital status: Married    Spouse name: Not on file  . Number of children: 2  . Years of  education: Not on file  . Highest education level: Bachelor's degree (e.g., BA, AB, BS)  Occupational History  . Occupation: retired  Scientific laboratory technician  . Financial resource strain: Not hard at all  . Food insecurity    Worry: Never true    Inability: Never true  . Transportation needs    Medical: No    Non-medical: No  Tobacco Use  . Smoking status: Never Smoker  . Smokeless tobacco: Never Used  Substance and Sexual Activity  . Alcohol use: Yes    Alcohol/week: 0.0 - 7.0 standard drinks  . Drug use: No  . Sexual activity: Not on file  Lifestyle  . Physical activity    Days per week: 0 days    Minutes per session: 0 min  . Stress: Only a little  Relationships  . Social Herbalist on phone: Patient refused    Gets together: Patient refused    Attends religious service: Patient refused    Active member of club or organization: Patient refused    Attends meetings of clubs or organizations: Patient refused    Relationship status: Patient refused  . Intimate partner violence    Fear of current or ex partner: Patient refused    Emotionally abused: Patient refused    Physically abused: Patient refused    Forced sexual activity: Patient refused  Other Topics Concern  . Not on file  Social History Narrative  . Not on file    Past Medical History:  Diagnosis Date  . Anxiety   . Arthritis   . Asthma    childhood asthma  . Cancer ([place]) 7 years ago   lymphoma, [place] (recent)  . Chronic kidney disease   . Colon polyps   .  GERD (gastroesophageal reflux disease)   . History of kidney stones   . Parkinson disease [facility])      Patient Active Problem List   Diagnosis Date Noted  . Dementia due to Parkinson's disease without behavioral disturbance ([place]) 07/18/2017  . Neurogenic orthostatic hypotension ([place]) 12/02/2016  . BPH with urinary obstruction 11/14/2016  . Urinary frequency 10/10/2016  . Microscopic hematuria 10/10/2016  . Parkinson's syndrome ([place]) 06/14/2016   . Arthritis 06/14/2016  . Depression 06/14/2016  . Heartburn 06/14/2016  . Urinary incontinence 06/14/2016  . Skin lesion 06/14/2016  . Dementia ([place]) 06/14/2016  . Bilateral edema of lower extremity 06/14/2016    Past Surgical History:  Procedure Laterality Date  . COLON SURGERY    . CYSTOSCOPY WITH LITHOLAPAXY N/A 11/14/2016   Procedure: CYSTOSCOPY WITH LITHOLAPAXY;  Surgeon: Hollice Espy, MD;  Location: [facility] ORS;  Service: Urology;  Laterality: N/A;  . EYE SURGERY Bilateral    Cataract Extraction with IOL  . HERNIA REPAIR  20 years  . MOHS SURGERY    . SMALL INTESTINE SURGERY     Per patient 7 years  . TRANSURETHRAL RESECTION OF PROSTATE N/A 11/14/2016   Procedure: TRANSURETHRAL RESECTION OF THE PROSTATE (TURP);  Surgeon: Hollice Espy, MD;  Location: [facility] ORS;  Service: Urology;  Laterality: N/A;    His family history includes Heart disease in his father. There is no history of Bladder Cancer, Prostate cancer, or Kidney cancer.   Current Outpatient Medications:  .  Amantadine HCl 100 MG tablet, Take 100 mg by mouth daily., Disp: , Rfl:  .  aspirin 81 MG tablet, Take 81 mg by mouth daily., Disp: , Rfl:  .  carbidopa-levodopa (SINEMET IR) 25-100 MG tablet, take 2 tablets by mouth four times a day as directed, Disp: 270 tablet, Rfl: 11 .  carbidopa-levodopa (SINEMET IR) 25-250 MG tablet, Take 2 tablets by mouth 4 (four) times daily., Disp: , Rfl:  .  Carboxymethylcellulose Sodium (THERATEARS OP), Place 1-2 drops into both eyes at bedtime., Disp: , Rfl:  .  cholecalciferol (VITAMIN D) 1000 units tablet, Take 1,000 Units by mouth daily., Disp: , Rfl:  .  DOK 100 MG capsule, TAKE 1 CAPSULE(100 MG) BY MOUTH TWICE DAILY (Patient taking differently: 100 mg 2 (two) times daily. Docusate sodium), Disp: 60 capsule, Rfl: 11 .  donepezil (ARICEPT) 10 MG tablet, Take 10 mg by mouth at bedtime. , Disp: , Rfl:  .  econazole nitrate 1 % cream, Apply 1 application topically daily. Applied to  feet, Disp: 85 g, Rfl: 0 .  finasteride (PROSCAR) 5 MG tablet, Take 1 tablet (5 mg total) by mouth daily., Disp: 30 tablet, Rfl: 11 .  furosemide (LASIX) 20 MG tablet, take 1 tablet by mouth once daily, Disp: 90 tablet, Rfl: 3 .  midodrine (PROAMATINE) 2.5 MG tablet, Take 2.5 mg by mouth 3 (three) times daily with meals., Disp: , Rfl:  .  Multiple Vitamin (MULTIVITAMIN) capsule, Take 1 capsule by mouth daily., Disp: , Rfl:  .  Multiple Vitamins-Minerals (OCUVITE PO), Take 1 tablet by mouth daily. , Disp: , Rfl:  .  niacinamide 500 MG tablet, , Disp: , Rfl: 0 .  nitrofurantoin, macrocrystal-monohydrate, (MACROBID) 100 MG capsule, Take 1 capsule (100 mg total) by mouth every 12 (twelve) hours., Disp: 14 capsule, Rfl: 0 .  Pimavanserin Tartrate (NUPLAZID) 17 MG TABS, Take 17 mg by mouth daily. , Disp: , Rfl:  .  Pimavanserin Tartrate (NUPLAZID) 34 MG CAPS, Take 34 mg by mouth  daily., Disp: , Rfl:  .  rOPINIRole (REQUIP) 0.25 MG tablet, Take 0.25 mg by mouth 3 (three) times daily., Disp: , Rfl:  .  sertraline (ZOLOFT) 50 MG tablet, TAKE 1 TABLET BY MOUTH EVERY DAY (Patient taking differently: Take 100 mg by mouth daily. ), Disp: 90 tablet, Rfl: 1 .  carbidopa-levodopa (SINEMET CR) 50-200 MG tablet, Take 1 tablet by mouth 2 (two) times daily. With the Aricept, Disp: , Rfl:   Patient Care Team: Mar Daring, PA-C as PCP - General (Family Medicine) Vladimir Crofts, MD as Consulting Physician (Neurology) Leandrew Koyanagi, MD as Referring Physician (Ophthalmology) Poggi, [place] Cork, MD as Consulting Physician (Surgery) Merril Abbe, MD as Referring Physician (Dermatology) Hollice Espy, MD as Consulting Physician (Urology) Dasher, Rayvon Char, MD as Consulting Physician (Dermatology) Sunday Corn, Criss Alvine Hubbard Hartshorn (Neurology)     Objective:    Vitals: BP (!) 88/48 (BP Location: Left Arm, Patient Position: Sitting, Cuff Size: Large)   Pulse 68   Temp (!) 96.6 F (35.9 C) (Other (Comment))    Resp 16   Ht 5\' 7"  (1.702 m)   Wt 166 lb (75.3 kg)   SpO2 97%   BMI 26.00 kg/m   Physical Exam Vitals signs reviewed.  Constitutional:      General: He is not in acute distress.    Appearance: Normal appearance. He is well-developed and normal weight. He is not ill-appearing.  HENT:     Head: Normocephalic and atraumatic.     Right Ear: Tympanic membrane, ear canal and external ear normal.     Left Ear: Tympanic membrane, ear canal and external ear normal.     Nose: Nose normal.     Mouth/Throat:     Mouth: Mucous membranes are moist.  Eyes:     General: No scleral icterus.       Right eye: No discharge.        Left eye: No discharge.     Extraocular Movements: Extraocular movements intact.     Conjunctiva/sclera: Conjunctivae normal.     Pupils: Pupils are equal, round, and reactive to light.  Neck:     Musculoskeletal: Normal range of motion and neck supple.     Thyroid: No thyromegaly.     Vascular: No carotid bruit.     Trachea: No tracheal deviation.  Cardiovascular:     Rate and Rhythm: Normal rate and regular rhythm.     Pulses: Normal pulses.     Heart sounds: Normal heart sounds. No murmur.  Pulmonary:     Effort: Pulmonary effort is normal. No respiratory distress.     Breath sounds: Normal breath sounds. No wheezing or rales.  Chest:     Chest wall: No tenderness.  Abdominal:     General: Abdomen is flat. Bowel sounds are normal. There is no distension.     Palpations: Abdomen is soft. There is no mass.     Tenderness: There is no abdominal tenderness. There is no guarding or rebound.  Musculoskeletal: Normal range of motion.        General: No tenderness.     Right lower leg: No edema.     Left lower leg: No edema.  Lymphadenopathy:     Cervical: No cervical adenopathy.  Skin:    General: Skin is warm and dry.     Capillary Refill: Capillary refill takes less than 2 seconds.     Findings: No erythema or rash.  Neurological:     Mental Status:  He is  alert and oriented to person, place, and time.     Motor: No abnormal muscle tone.     Gait: Gait abnormal (parkinsons).     Deep Tendon Reflexes: Reflexes are normal and symmetric.  Psychiatric:        Mood and Affect: Mood normal.        Behavior: Behavior normal.        Thought Content: Thought content normal.        Judgment: Judgment normal.     Activities of Daily Living In your present state of health, do you have any difficulty performing the following activities: 03/13/2019  Hearing? N  Vision? N  Comment Wears eye glasses daily.   Difficulty concentrating or making decisions? N  Walking or climbing stairs? Y  Comment Due to Parkinsons.   Dressing or bathing? N  Doing errands, shopping? Y  Comment Does not drive.   Preparing Food and eating ? N  Using the Toilet? N  In the past six months, have you accidently leaked urine? Y  Comment Due to Parkinsons- wears protection.   Do you have problems with loss of bowel control? N  Managing your Medications? Y  Comment Wife manages medications.   Managing your Finances? Y  Comment Wife manages finances.   Housekeeping or managing your Housekeeping? N  Comment Wife does housework.   Some recent data might be hidden    Fall Risk Assessment Fall Risk  03/13/2019 03/08/2018 02/10/2017  Falls in the past year? 1 Yes Yes  Number falls in past yr: 1 2 or more 2 or more  Injury with Fall? 0 No No  Risk for fall due to : - - Impaired mobility  Follow up Falls prevention discussed Falls prevention discussed Falls prevention discussed     Depression Screen PHQ 2/9 Scores 03/13/2019 03/08/2018 02/10/2017  PHQ - 2 Score 2 4 3   PHQ- 9 Score 11 14 16     6CIT Screen 02/10/2017  What Year? 0 points  What month? 0 points  What time? 0 points  Count back from 20 0 points  Months in reverse 4 points  Repeat phrase 2 points  Total Score 6       Assessment & Plan:    Annual Physical Reviewed patient's Family Medical History  Reviewed and updated list of patient's medical providers Assessment of cognitive impairment was done Assessed patient's functional ability Established a written schedule for health screening [place] Completed and Reviewed  Exercise Activities and Dietary recommendations Goals    . DIET - INCREASE WATER INTAKE     Recommend increasing water intake to 4-6 glasses a day.     . Increase water intake     Recommend increasing water intake 4-6 glasses of water a day.    [place] LIFESTYLE - DECREASE FALLS RISK     Recommend to remove any items from the home that may cause slips or trips.       Immunization History  Administered Date(s) Administered  . Influenza, High Dose Seasonal PF 07/09/2018  . Influenza-Unspecified 07/24/2016  . Pneumococcal Conjugate-13 12/16/2016  . Pneumococcal Polysaccharide-23 01/09/2018  . Zoster Recombinat (Shingrix) 07/18/2017, 01/29/2018    Health Maintenance  Topic Date Due  . INFLUENZA VACCINE  05/25/2019  . TETANUS/TDAP  10/24/2026 (Originally 03/24/1952)  . PNA vac Low Risk Adult  Completed     Discussed health benefits of physical activity, and encouraged him to engage in regular exercise appropriate for his  age and condition.   1. Annual physical exam Normal exam today. Up to date on screenings and vaccinations.   2. Neurogenic orthostatic hypotension ([facility]) BP low today. Patient not fasting and has been trying to increase water intake. Will increase Midodrine to 5mg  from 2.5mg  TID.  - midodrine (PROAMATINE) 5 MG tablet; Take 1 tablet (5 mg total) by mouth 3 (three) times daily with meals.  Dispense: 270 tablet; Refill: 1  3. Dementia due to Parkinson's disease without behavioral disturbance ([place]) Will check labs as below and f/u pending results. Followed by Neurology and has appt today.  - CBC with Differential/Platelet - Comprehensive metabolic panel - Hemoglobin A1c - Lipid panel - TSH  4. Parkinson's syndrome ([place])  See above medical treatment plan. - CBC with Differential/Platelet - Comprehensive metabolic panel - Hemoglobin A1c - Lipid panel - TSH  5. Hallucination, visual Worsening per patient. Advised to discuss consideration for addition of Seroquel at bedtime for hallucinations with neurologist. If agreeable we may can try this medication.   6. Need for influenza vaccination Flu vaccine given today without complication. Patient sat upright for 15 minutes to check for adverse reaction before being released. - Flu Vaccine QUAD High Dose(Fluad)  ------------------------------------------------------------------------------------------------------------    Mar Daring, PA-C  [facility]

## 2019-06-27 ENCOUNTER — Other Ambulatory Visit: Payer: Self-pay

## 2019-06-27 ENCOUNTER — Encounter: Payer: Self-pay | Admitting: Physician Assistant

## 2019-06-27 ENCOUNTER — Ambulatory Visit (INDEPENDENT_AMBULATORY_CARE_PROVIDER_SITE_OTHER): Payer: Medicare Other | Admitting: Physician Assistant

## 2019-06-27 VITALS — BP 88/48 | HR 68 | Temp 96.6°F | Resp 16 | Ht 67.0 in | Wt 166.0 lb

## 2019-06-27 DIAGNOSIS — Z23 Encounter for immunization: Secondary | ICD-10-CM | POA: Diagnosis not present

## 2019-06-27 DIAGNOSIS — G903 Multi-system degeneration of the autonomic nervous system: Secondary | ICD-10-CM

## 2019-06-27 DIAGNOSIS — F0280 Dementia in other diseases classified elsewhere without behavioral disturbance: Secondary | ICD-10-CM

## 2019-06-27 DIAGNOSIS — G20 Parkinson's disease: Secondary | ICD-10-CM

## 2019-06-27 DIAGNOSIS — Z0000 Encounter for general adult medical examination without abnormal findings: Secondary | ICD-10-CM

## 2019-06-27 DIAGNOSIS — R441 Visual hallucinations: Secondary | ICD-10-CM

## 2019-06-27 MED ORDER — MIDODRINE HCL 5 MG PO TABS: 5.0000 mg | ORAL_TABLET | Freq: Three times a day (TID) | ORAL | 1 refills | Status: DC

## 2019-06-27 NOTE — Patient Instructions (Signed)
Health Maintenance After Age 83 After age 83, you are at a higher risk for certain long-term diseases and infections as well as injuries from falls. Falls are a major cause of broken bones and head injuries in people who are older than age 83. Getting regular preventive care can help to keep you healthy and well. Preventive care includes getting regular testing and making lifestyle changes as recommended by your health care provider. Talk with your health care provider about:  Which screenings and tests you should have. A screening is a test that checks for a disease when you have no symptoms.  A diet and exercise plan that is right for you. What should I know about screenings and tests to prevent falls? Screening and testing are the best ways to find a health problem early. Early diagnosis and treatment give you the best chance of managing medical conditions that are common after age 83. Certain conditions and lifestyle choices may make you more likely to have a fall. Your health care provider may recommend:  Regular vision checks. Poor vision and conditions such as cataracts can make you more likely to have a fall. If you wear glasses, make sure to get your prescription updated if your vision changes.  Medicine review. Work with your health care provider to regularly review all of the medicines you are taking, including over-the-counter medicines. Ask your health care provider about any side effects that may make you more likely to have a fall. Tell your health care provider if any medicines that you take make you feel dizzy or sleepy.  Osteoporosis screening. Osteoporosis is a condition that causes the bones to get weaker. This can make the bones weak and cause them to break more easily.  Blood pressure screening. Blood pressure changes and medicines to control blood pressure can make you feel dizzy.  Strength and balance checks. Your health care provider may recommend certain tests to check your  strength and balance while standing, walking, or changing positions.  Foot health exam. Foot pain and numbness, as well as not wearing proper footwear, can make you more likely to have a fall.  Depression screening. You may be more likely to have a fall if you have a fear of falling, feel emotionally low, or feel unable to do activities that you used to do.  Alcohol use screening. Using too much alcohol can affect your balance and may make you more likely to have a fall. What actions can I take to lower my risk of falls? General instructions  Talk with your health care provider about your risks for falling. Tell your health care provider if: ? You fall. Be sure to tell your health care provider about all falls, even ones that seem minor. ? You feel dizzy, sleepy, or off-balance.  Take over-the-counter and prescription medicines only as told by your health care provider. These include any supplements.  Eat a healthy diet and maintain a healthy weight. A healthy diet includes low-fat dairy products, low-fat (lean) meats, and fiber from whole grains, beans, and lots of fruits and vegetables. Home safety  Remove any tripping hazards, such as rugs, cords, and clutter.  Install safety equipment such as grab bars in bathrooms and safety rails on stairs.  Keep rooms and walkways well-lit. Activity   Follow a regular exercise program to stay fit. This will help you maintain your balance. Ask your health care provider what types of exercise are appropriate for you.  If you need a cane or   walker, use it as recommended by your health care provider.  Wear supportive shoes that have nonskid soles. Lifestyle  Do not drink alcohol if your health care provider tells you not to drink.  If you drink alcohol, limit how much you have: ? 0-1 drink a day for women. ? 0-2 drinks a day for men.  Be aware of how much alcohol is in your drink. In the [place] one drink equals one typical bottle of beer (12  oz), one-half glass of wine (5 oz), or one shot of hard liquor (1 oz).  Do not use any products that contain nicotine or tobacco, such as cigarettes and e-cigarettes. If you need help quitting, ask your health care provider. Summary  Having a healthy lifestyle and getting preventive care can help to protect your health and wellness after age 83.  Screening and testing are the best way to find a health problem early and help you avoid having a fall. Early diagnosis and treatment give you the best chance for managing medical conditions that are more common for people who are older than age 83.  Falls are a major cause of broken bones and head injuries in people who are older than age 83. Take precautions to prevent a fall at home.  Work with your health care provider to learn what changes you can make to improve your health and wellness and to prevent falls. This information is not intended to replace advice given to you by your health care provider. Make sure you discuss any questions you have with your health care provider. Document Released: 08/23/2017 Document Revised: 01/31/2019 Document Reviewed: 08/23/2017 Elsevier Patient Education  2020 Elsevier Inc.  

## 2019-06-28 ENCOUNTER — Telehealth: Payer: Self-pay

## 2019-06-28 ENCOUNTER — Telehealth: Payer: Self-pay | Admitting: *Deleted

## 2019-06-28 LAB — COMPREHENSIVE METABOLIC PANEL
ALT: 8 IU/L (ref 0–44)
AST: 20 IU/L (ref 0–40)
Albumin/Globulin Ratio: 2.2 (ref 1.2–2.2)
Albumin: 4.4 g/dL (ref 3.6–4.6)
Alkaline Phosphatase: 49 IU/L (ref 39–117)
BUN/Creatinine Ratio: 30 — ABNORMAL HIGH (ref 10–24)
BUN: 29 mg/dL — ABNORMAL HIGH (ref 8–27)
Bilirubin Total: 0.4 mg/dL (ref 0.0–1.2)
CO2: 24 mmol/L (ref 20–29)
Calcium: 9.1 mg/dL (ref 8.6–10.2)
Chloride: 101 mmol/L (ref 96–106)
Creatinine, Ser: 0.97 mg/dL (ref 0.76–1.27)
GFR calc Af Amer: 83 mL/min/{1.73_m2} (ref >59)
GFR calc non Af Amer: 71 mL/min/{1.73_m2} (ref >59)
Globulin, Total: 2 g/dL (ref 1.5–4.5)
Glucose: 92 mg/dL (ref 65–99)
Potassium: 4.2 mmol/L (ref 3.5–5.2)
Sodium: 139 mmol/L (ref 134–144)
Total Protein: 6.4 g/dL (ref 6.0–8.5)

## 2019-06-28 LAB — CBC WITH DIFFERENTIAL/PLATELET
Basophils Absolute: 0 10*3/uL (ref 0.0–0.2)
Basos: 0 %
EOS (ABSOLUTE): 0.1 10*3/uL (ref 0.0–0.4)
Eos: 1 %
Hematocrit: 35.6 % — ABNORMAL LOW (ref 37.5–51.0)
Hemoglobin: 12.2 g/dL — ABNORMAL LOW (ref 13.0–17.7)
Immature Grans (Abs): 0 10*3/uL (ref 0.0–0.1)
Immature Granulocytes: 0 %
Lymphocytes Absolute: 1.1 10*3/uL (ref 0.7–3.1)
Lymphs: 20 %
MCH: 31.1 pg (ref 26.6–33.0)
MCHC: 34.3 g/dL (ref 31.5–35.7)
MCV: 91 fL (ref 79–97)
Monocytes Absolute: 0.5 10*3/uL (ref 0.1–0.9)
Monocytes: 8 %
Neutrophils Absolute: 4.1 10*3/uL (ref 1.4–7.0)
Neutrophils: 71 %
Platelets: 201 10*3/uL (ref 150–450)
RBC: 3.92 x10E6/uL — ABNORMAL LOW (ref 4.14–5.80)
RDW: 12.9 % (ref 11.6–15.4)
WBC: 5.8 10*3/uL (ref 3.4–10.8)

## 2019-06-28 LAB — LIPID PANEL
Chol/HDL Ratio: 3.7 ratio (ref 0.0–5.0)
Cholesterol, Total: 179 mg/dL (ref 100–199)
HDL: 48 mg/dL (ref >39)
LDL Chol Calc (NIH): 117 mg/dL — ABNORMAL HIGH (ref 0–99)
Triglycerides: 72 mg/dL (ref 0–149)
VLDL Cholesterol Cal: 14 mg/dL (ref 5–40)

## 2019-06-28 LAB — HEMOGLOBIN A1C
Est. average glucose Bld gHb Est-mCnc: 105 mg/dL
Hgb A1c MFr Bld: 5.3 % (ref 4.8–5.6)

## 2019-06-28 LAB — TSH: TSH: 1.22 u[IU]/mL (ref 0.450–4.500)

## 2019-06-28 NOTE — Telephone Encounter (Signed)
Patient's wife advised of labs results.

## 2019-06-28 NOTE — Telephone Encounter (Signed)
-----   Message from Mar Daring, PA-C sent at 06/28/2019  9:23 AM EDT ----- All labs are within normal limits and stable.  Thanks! -JB

## 2019-06-28 NOTE — Telephone Encounter (Signed)
Called back and left VM

## 2019-06-28 NOTE — Telephone Encounter (Signed)
Patient called office requesting Luke Durham call him back. Patient would say whet he wants to discuss, just that it;'s concerning yesterday's visit.

## 2019-07-05 NOTE — Telephone Encounter (Signed)
Called and left another VM

## 2019-07-05 NOTE — Telephone Encounter (Signed)
Patient calling that  He wants Tawanna Sat to call him back. He was advised that Tawanna Sat had reached out to him. Told patient that it would be after 5 pm.

## 2019-07-05 NOTE — Telephone Encounter (Signed)
Patient never returned call. Note closed

## 2019-07-09 NOTE — Telephone Encounter (Signed)
Called and spoke to him and his wife. They would like to cancel the referral for the therapist.

## 2019-07-15 ENCOUNTER — Other Ambulatory Visit: Payer: Self-pay | Admitting: *Deleted

## 2019-07-15 MED ORDER — FINASTERIDE 5 MG PO TABS: 5.0000 mg | ORAL_TABLET | Freq: Every day | ORAL | 11 refills | Status: DC

## 2019-07-16 IMAGING — CR DG ABDOMEN 1V
1 series · 2 of 2 positions shown · non-contrast
Comparison: CT abdomen pelvis of 10/25/2016

CLINICAL DATA: Hematuria today, history of urinary bladder calculi

EXAM:
ABDOMEN - 1 VIEW

[Series 1: dg abd 1 view · 0.14mm/px · 2 of 2 slices shown]
[im 1/2]
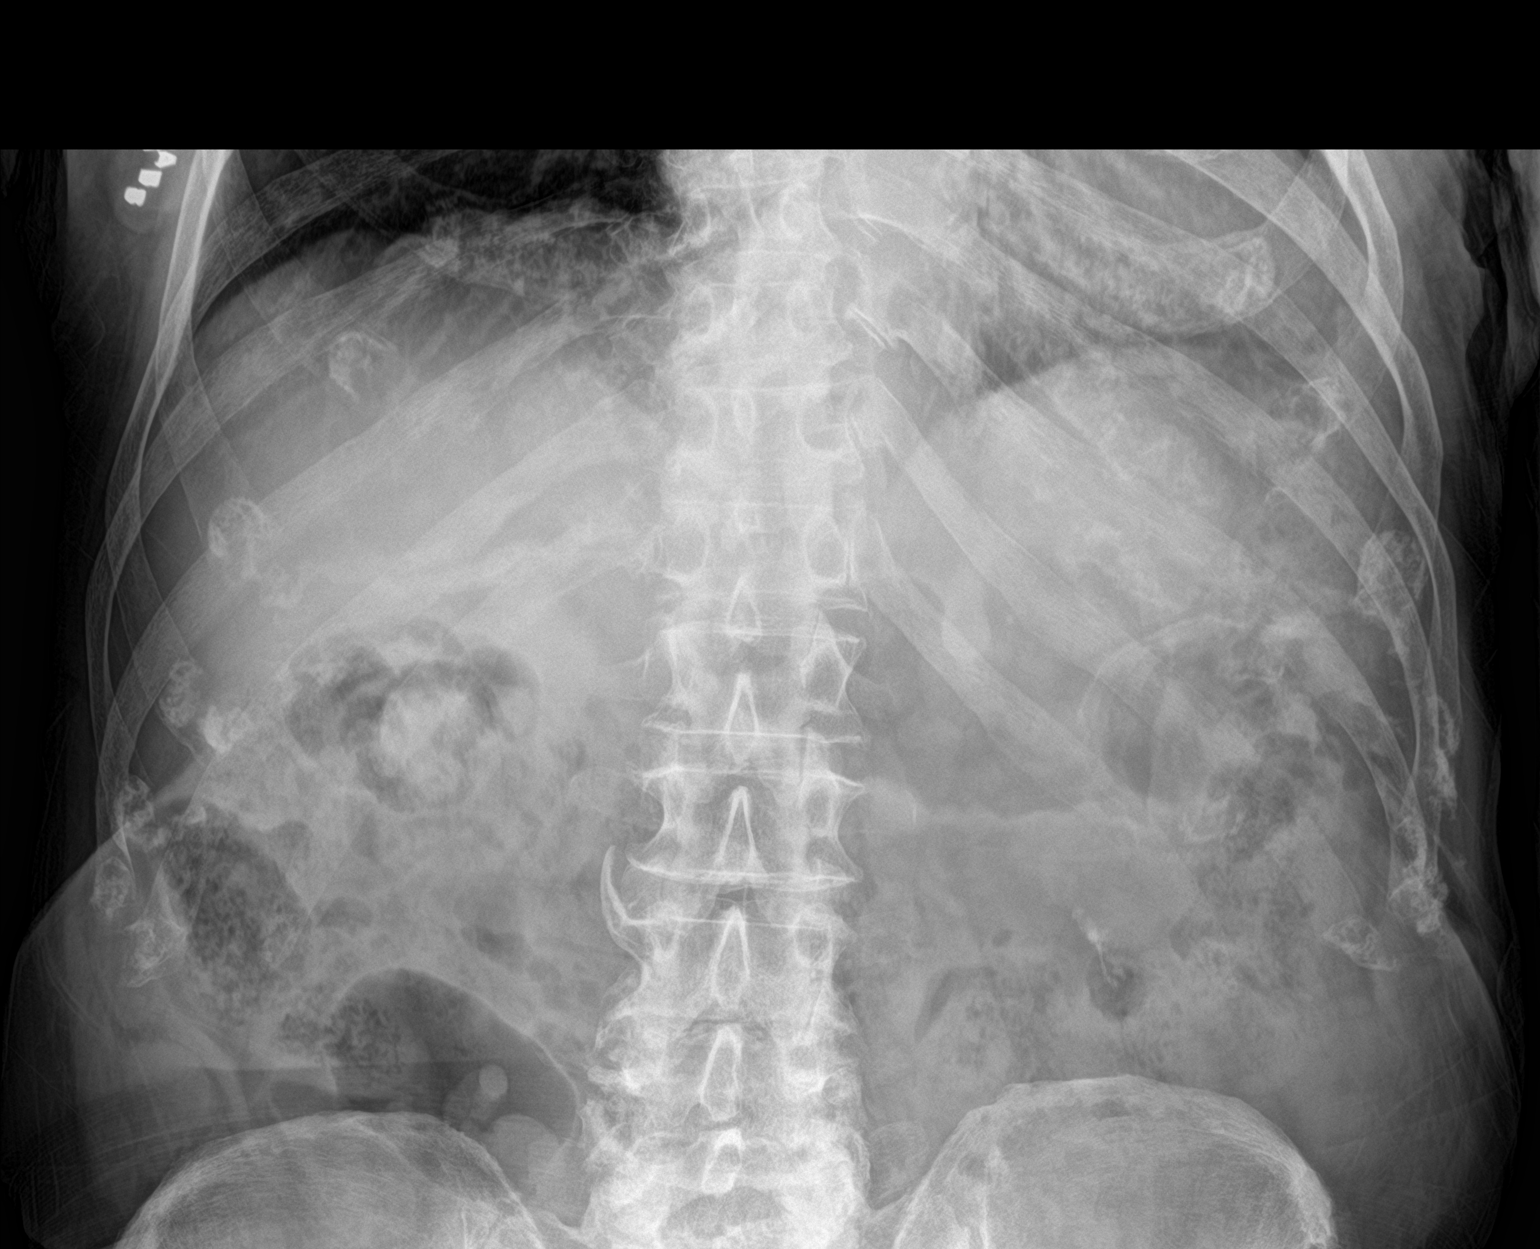
[im 2/2]
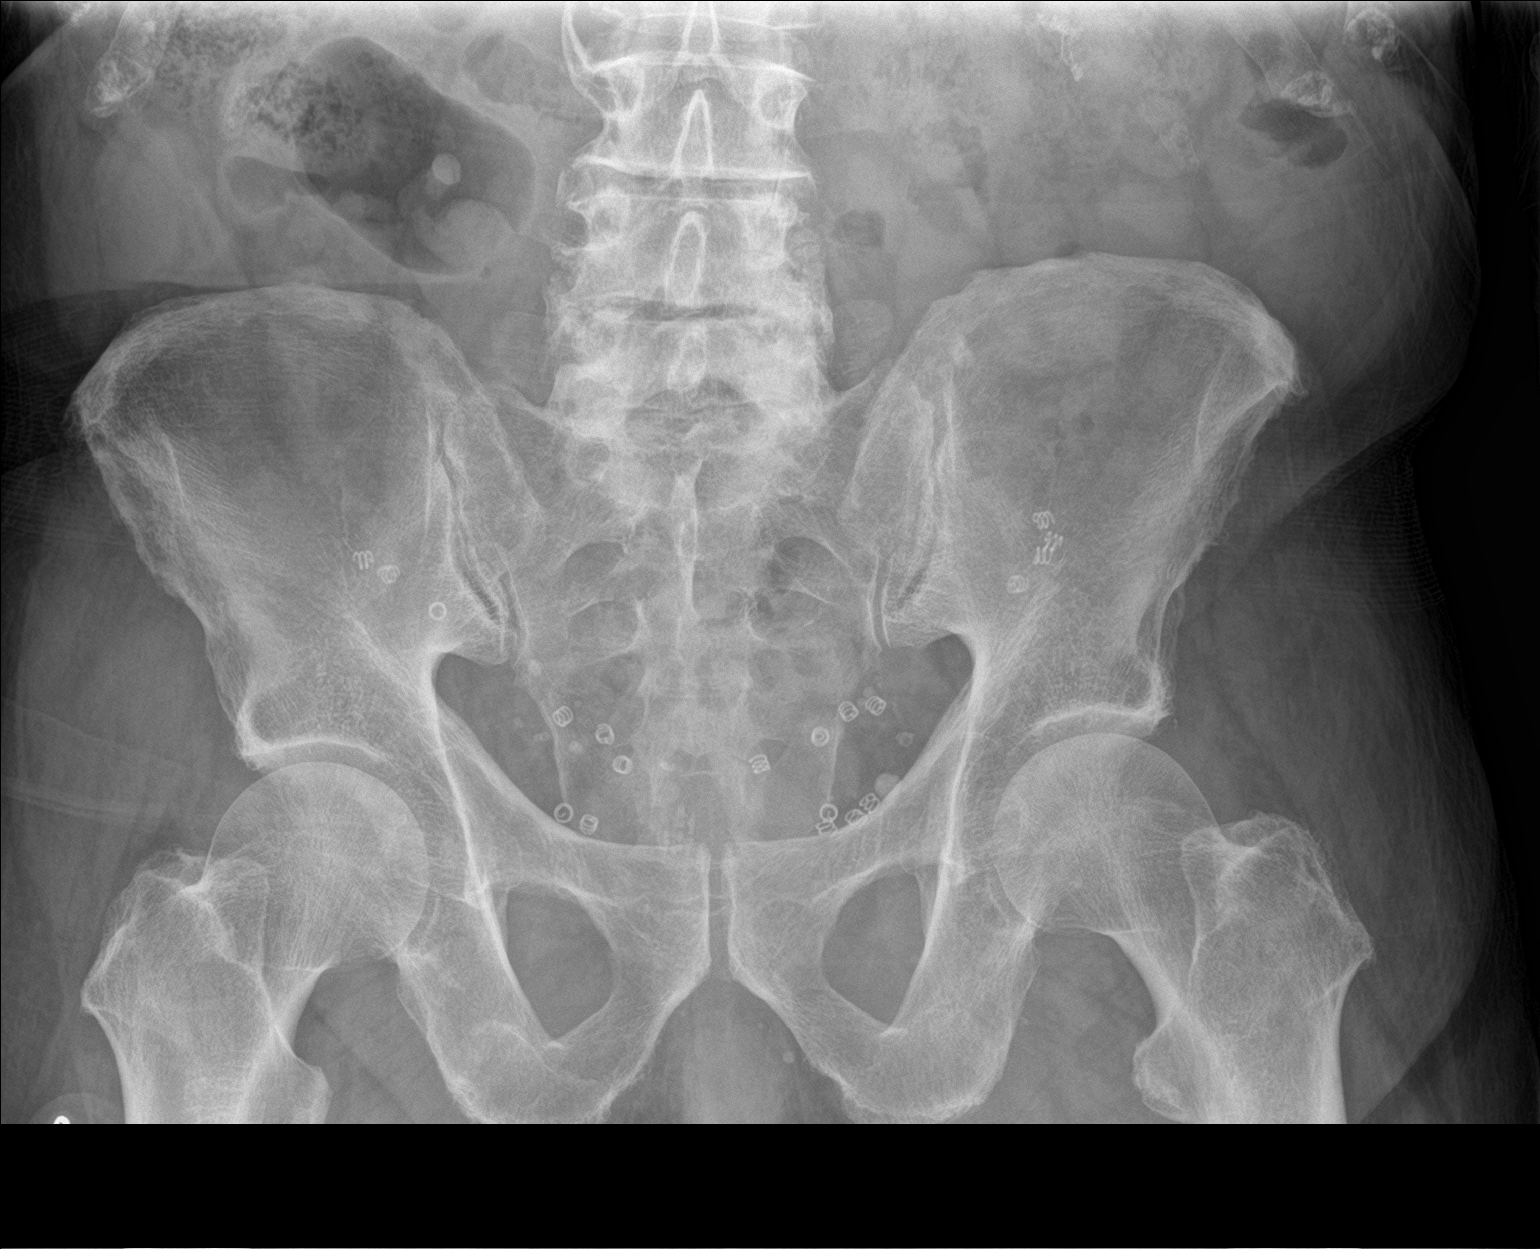

[2 of 2 positions shown; findings below may reference images not displayed]

FINDINGS: There is a small calcification within the left abdomen which could
represent the previously demonstrated tiny left upper pole renal
calculus. No other additional calculi are seen. The bowel gas
pattern is nonspecific. The previously noted urinary bladder calculi
are not currently seen. Degenerative change is noted in the lower
lumbar spine.
IMPRESSION: 1. Possible small left upper pole renal calculus.
2. No additional renal or ureteral calculi are seen. No definite
bladder calculi are noted.

## 2019-10-01 ENCOUNTER — Emergency Department: Payer: Medicare Other

## 2019-10-01 ENCOUNTER — Emergency Department
Admission: EM | Admit: 2019-10-01 | Discharge: 2019-10-01 | Disposition: A | Discharge status: Home / Self Care | Payer: Medicare Other | Attending: Emergency Medicine | Admitting: Emergency Medicine

## 2019-10-01 ENCOUNTER — Other Ambulatory Visit: Payer: Self-pay

## 2019-10-01 ENCOUNTER — Ambulatory Visit: Payer: Self-pay

## 2019-10-01 DIAGNOSIS — Z79899 Other long term (current) drug therapy: Secondary | ICD-10-CM | POA: Diagnosis not present

## 2019-10-01 DIAGNOSIS — Z85828 Personal history of other malignant neoplasm of skin: Secondary | ICD-10-CM | POA: Insufficient documentation

## 2019-10-01 DIAGNOSIS — F0280 Dementia in other diseases classified elsewhere without behavioral disturbance: Secondary | ICD-10-CM | POA: Insufficient documentation

## 2019-10-01 DIAGNOSIS — Z8571 Personal history of Hodgkin lymphoma: Secondary | ICD-10-CM | POA: Insufficient documentation

## 2019-10-01 DIAGNOSIS — R0602 Shortness of breath: Secondary | ICD-10-CM | POA: Diagnosis present

## 2019-10-01 DIAGNOSIS — G20 Parkinson's disease: Secondary | ICD-10-CM | POA: Insufficient documentation

## 2019-10-01 DIAGNOSIS — U071 COVID-19: Secondary | ICD-10-CM | POA: Diagnosis not present

## 2019-10-01 DIAGNOSIS — E860 Dehydration: Secondary | ICD-10-CM | POA: Insufficient documentation

## 2019-10-01 LAB — CBC
HCT: 38.6 % — ABNORMAL LOW (ref 39.0–52.0)
Hemoglobin: 12.5 g/dL — ABNORMAL LOW (ref 13.0–17.0)
MCH: 31 pg (ref 26.0–34.0)
MCHC: 32.4 g/dL (ref 30.0–36.0)
MCV: 95.8 fL (ref 80.0–100.0)
Platelets: 187 10*3/uL (ref 150–400)
RBC: 4.03 MIL/uL — ABNORMAL LOW (ref 4.22–5.81)
RDW: 14.4 % (ref 11.5–15.5)
WBC: 4.1 10*3/uL (ref 4.0–10.5)
nRBC: 0 % (ref 0.0–0.2)

## 2019-10-01 LAB — BASIC METABOLIC PANEL
Anion gap: 11 (ref 5–15)
BUN: 39 mg/dL — ABNORMAL HIGH (ref 8–23)
CO2: 26 mmol/L (ref 22–32)
Calcium: 8.6 mg/dL — ABNORMAL LOW (ref 8.9–10.3)
Chloride: 100 mmol/L (ref 98–111)
Creatinine, Ser: 1.45 mg/dL — ABNORMAL HIGH (ref 0.61–1.24)
GFR calc Af Amer: 51 mL/min — ABNORMAL LOW (ref >60)
GFR calc non Af Amer: 44 mL/min — ABNORMAL LOW (ref >60)
Glucose, Bld: 110 mg/dL — ABNORMAL HIGH (ref 70–99)
Potassium: 4.2 mmol/L (ref 3.5–5.1)
Sodium: 137 mmol/L (ref 135–145)

## 2019-10-01 MED ORDER — ALBUTEROL SULFATE HFA 108 (90 BASE) MCG/ACT IN AERS: 2.0000 | INHALATION_SPRAY | Freq: Four times a day (QID) | RESPIRATORY_TRACT | 0 refills | Status: DC | PRN

## 2019-10-01 MED ORDER — MIDODRINE HCL 5 MG PO TABS
5.0000 mg | ORAL_TABLET | Freq: Once | ORAL | Status: DC
  Filled 2019-10-01: qty 1

## 2019-10-01 MED ORDER — CARBIDOPA-LEVODOPA 25-250 MG PO TABS
1.0000 | ORAL_TABLET | Freq: Once | ORAL | Status: DC
  Filled 2019-10-01: qty 1

## 2019-10-01 MED ORDER — LACTATED RINGERS IV BOLUS
1000.0000 mL | Freq: Once | INTRAVENOUS | Status: AC
  Administered 2019-10-01: 1000 mL via INTRAVENOUS

## 2019-10-01 MED ORDER — ONDANSETRON 4 MG PO TBDP: 4.0000 mg | ORAL_TABLET | Freq: Three times a day (TID) | ORAL | 0 refills | Status: DC | PRN

## 2019-10-01 NOTE — Telephone Encounter (Signed)
Last 2 days problems moving, delusions. Caregiver tested positive for covid-19 yesterday. Pt also c/o mild headache for the past several days as well as occasional diaphoresis. Wife denies other sx. Called [facility] and spoke with Jiles Garter who recommended pt to go to ED. Wife plans to take him to [facility].  Called [facility] and spoke with Tiffany to see if there are any instructions to give to pt's wife when they reach the ED. Advised of no other instructions. Called pt's wife back and informed her that there are no special instruction. Advised pt's wife to don a a mask and to place a mask on pt and to use hand sanitizer frequently. Care advice given and pt's wife verbalized understanding.  Reason for Disposition . [1] HIGH RISK patient (e.g., age > 33 years, diabetes, heart or lung disease, weak immune system) AND [2] new or worsening symptoms  Answer Assessment - Initial Assessment Questions 1. COVID-19 DIAGNOSIS: "Who made your Coronavirus (COVID-19) diagnosis?" "Was it confirmed by a positive lab test?" If not diagnosed by a HCP, ask "Are there lots of cases (community spread) where you live?" (See public health department website, if unsure)     Lab test [facility] tested pt 2. COVID-19 EXPOSURE: "Was there any known exposure to COVID before the symptoms began?" CDC Definition of close contact: within 6 feet (2 meters) for a total of 15 minutes or more over a 24-hour period.      Except for caregiver yesterday 2 pm 3. ONSET: "When did the COVID-19 symptoms start?"      2 days 4. WORST SYMPTOM: "What is your worst symptom?" (e.g., cough, fever, shortness of breath, muscle aches)     All of it 5. COUGH: "Do you have a cough?" If so, ask: "How bad is the cough?"       no 6. FEVER: "Do you have a fever?" If so, ask: "What is your temperature, how was it measured, and when did it start?"     no 7. RESPIRATORY STATUS: "Describe your breathing?" (e.g., shortness of breath, wheezing, unable to speak)      no 8.  BETTER-SAME-WORSE: "Are you getting better, staying the same or getting worse compared to yesterday?"  If getting worse, ask, "In what way?"     better 9. HIGH RISK DISEASE: "Do you have any chronic medical problems?" (e.g., asthma, heart or lung disease, weak immune system, obesity, etc.)     Parkinson's, age 85. PREGNANCY: "Is there any chance you are pregnant?" "When was your last menstrual period?"       n/a 11. OTHER SYMPTOMS: "Do you have any other symptoms?"  (e.g., chills, fatigue, headache, loss of smell or taste, muscle pain, sore throat; new loss of smell or taste especially support the diagnosis of COVID-19)       Sweating, mild x 3-4 days, loss of smell (per wife due to Parkinson's  Protocols used: CORONAVIRUS (COVID-19) DIAGNOSED OR SUSPECTED-A-AH

## 2019-10-01 NOTE — Telephone Encounter (Signed)
From [facility] 

## 2019-10-01 NOTE — ED Notes (Signed)

## 2019-10-01 NOTE — ED Triage Notes (Addendum)
Pt c/o SOB and mood going up and down for the past week. ED tech reports pt talking to someone in the room that is not there. Pt has slurred speech and is hard to understand, pt wife called to stay with pt and give more information. Pt denies any pain at present.   Per pt wife, pt has has increased weakness, cough with nasal drip, confusion over the past several days, states he has also c/o HA.

## 2019-10-01 NOTE — ED Provider Notes (Signed)
[facility] Emergency Department Provider Note   ____________________________________________   None    (approximate)  I have reviewed the triage vital signs and the nursing notes.   HISTORY  Chief Complaint Shortness of Breath    HPI Luke Durham is a 85 y.o. male with past medical history of Parkinson disease and dementia who presents to the ED for COVID-19.  Patient's wife states that they were both exposed to COVID-19 by a caregiver who regularly comes to the house.  They underwent testing and heard back earlier today that he was positive for COVID-19.  I spoke to patient's PCP office, who recommended he be evaluated in the ED.  Patient's wife states that he will deal with confusion from time to time and has been more confused lately, but this was not necessarily out of the normal for him.  He has not had any fevers and denies any cough, chest pain, or shortness of breath.  She does state that he has seemed more congested than usual.        Past Medical History:  Diagnosis Date  . Anxiety   . Arthritis   . Asthma    childhood asthma  . Cancer ([place]) 7 years ago   lymphoma, [place] (recent)  . Chronic kidney disease   . Colon polyps   . GERD (gastroesophageal reflux disease)   . History of kidney stones   . Parkinson disease [facility])     Patient Active Problem List   Diagnosis Date Noted  . Dementia due to Parkinson's disease without behavioral disturbance ([place]) 07/18/2017  . Neurogenic orthostatic hypotension ([place]) 12/02/2016  . BPH with urinary obstruction 11/14/2016  . Urinary frequency 10/10/2016  . Microscopic hematuria 10/10/2016  . Parkinson's syndrome ([place]) 06/14/2016  . Arthritis 06/14/2016  . Depression 06/14/2016  . Heartburn 06/14/2016  . Urinary incontinence 06/14/2016  . Skin lesion 06/14/2016  . Bilateral edema of lower extremity 06/14/2016    Past Surgical History:  Procedure Laterality Date  . COLON SURGERY    .  CYSTOSCOPY WITH LITHOLAPAXY N/A 11/14/2016   Procedure: CYSTOSCOPY WITH LITHOLAPAXY;  Surgeon: Hollice Espy, MD;  Location: [facility] ORS;  Service: Urology;  Laterality: N/A;  . EYE SURGERY Bilateral    Cataract Extraction with IOL  . HERNIA REPAIR  20 years  . MOHS SURGERY    . SMALL INTESTINE SURGERY     Per patient 7 years  . TRANSURETHRAL RESECTION OF PROSTATE N/A 11/14/2016   Procedure: TRANSURETHRAL RESECTION OF THE PROSTATE (TURP);  Surgeon: Hollice Espy, MD;  Location: [facility] ORS;  Service: Urology;  Laterality: N/A;    Prior to Admission medications   Medication Sig Start Date End Date Taking? Authorizing Provider  albuterol (VENTOLIN HFA) 108 (90 Base) MCG/ACT inhaler Inhale 2 puffs into the lungs every 6 (six) hours as needed for wheezing or shortness of breath. 10/01/19   Blake Divine, MD  carbidopa-levodopa (SINEMET IR) 25-250 MG tablet Take 2 tablets by mouth 4 (four) times daily.    [provider]  Carboxymethylcellulose Sodium (THERATEARS OP) Place 1-2 drops into both eyes at bedtime.    [provider]  cholecalciferol (VITAMIN D) 1000 units tablet Take 1,000 Units by mouth daily.    [provider]  DOK 100 MG capsule TAKE 1 CAPSULE(100 MG) BY MOUTH TWICE DAILY Patient taking differently: 100 mg 2 (two) times daily. Docusate sodium 02/01/19   Mar Daring, PA-C  donepezil (ARICEPT) 10 MG tablet Take 10 mg by  mouth at bedtime.  09/14/16   [provider]  econazole nitrate 1 % cream Apply 1 application topically daily. Applied to feet 03/08/18   Mar Daring, PA-C  finasteride (PROSCAR) 5 MG tablet Take 1 tablet (5 mg total) by mouth daily. 07/15/19   Zara Council A, PA-C  furosemide (LASIX) 20 MG tablet take 1 tablet by mouth once daily 09/13/17   Mar Daring, PA-C  midodrine (PROAMATINE) 5 MG tablet Take 1 tablet (5 mg total) by mouth 3 (three) times daily with meals. 06/27/19   Mar Daring, PA-C  Multiple  Vitamin (MULTIVITAMIN) capsule Take 1 capsule by mouth daily.    [provider]  Multiple Vitamins-Minerals (OCUVITE PO) Take 1 tablet by mouth daily.     [provider]  niacinamide 500 MG tablet  12/09/16   [provider]  ondansetron (ZOFRAN ODT) 4 MG disintegrating tablet Take 1 tablet (4 mg total) by mouth every 8 (eight) hours as needed for nausea or vomiting. 10/01/19   Blake Divine, MD  Pimavanserin Tartrate (NUPLAZID) 17 MG TABS Take 17 mg by mouth daily.     [provider]  Pimavanserin Tartrate (NUPLAZID) 34 MG CAPS Take 34 mg by mouth daily.    [provider]  sertraline (ZOLOFT) 50 MG tablet TAKE 1 TABLET BY MOUTH EVERY DAY Patient taking differently: Take 100 mg by mouth daily.  12/06/18   Mar Daring, PA-C    Allergies Patient has no known allergies.  Family History  Problem Relation Age of Onset  . Heart disease Father   . Bladder Cancer Neg Hx   . Prostate cancer Neg Hx   . Kidney cancer Neg Hx     Social History Social History   Tobacco Use  . Smoking status: Never Smoker  . Smokeless tobacco: Never Used  Substance Use Topics  . Alcohol use: Yes    Alcohol/week: 0.0 - 7.0 standard drinks  . Drug use: No    Review of Systems  Constitutional: No fever/chills Eyes: No visual changes. ENT: No sore throat. Cardiovascular: Denies chest pain. Respiratory: Denies shortness of breath.  Positive for congestion. Gastrointestinal: No abdominal pain.  No nausea, no vomiting.  No diarrhea.  No constipation. Genitourinary: Negative for dysuria. Musculoskeletal: Negative for back pain. Skin: Negative for rash. Neurological: Negative for headaches, focal weakness or numbness.  Positive for confusion.  ____________________________________________   PHYSICAL EXAM:  VITAL SIGNS: ED Triage Vitals  Enc Vitals Group     BP 10/01/19 1556 (!) 85/54     Pulse Rate 10/01/19 1556 64     Resp 10/01/19 1556 16      Temp 10/01/19 1556 98.3 F (36.8 C)     Temp Source 10/01/19 1556 Oral     SpO2 10/01/19 1556 99 %     Weight 10/01/19 1604 165 lb (74.8 kg)     Height 10/01/19 1604 5\' 7"  (1.702 m)     Head Circumference --      Peak Flow --      Pain Score 10/01/19 1604 0     Pain Loc --      Pain Edu? --      Excl. in [place]? --     Constitutional: Alert and oriented. Eyes: Conjunctivae are normal. Head: Atraumatic. Nose: No congestion/rhinnorhea. Mouth/Throat: Mucous membranes are moist. Neck: Normal ROM Cardiovascular: Normal rate, regular rhythm. Grossly normal heart sounds. Respiratory: Normal respiratory effort.  No retractions. Lungs CTAB. Gastrointestinal: Soft and  nontender. No distention. Genitourinary: deferred Musculoskeletal: No lower extremity tenderness nor edema. Neurologic:  Normal speech and language. No gross focal neurologic deficits are appreciated. Skin:  Skin is warm, dry and intact. No rash noted. Psychiatric: Mood and affect are normal. Speech and behavior are normal.  ____________________________________________   LABS (all labs ordered are listed, but only abnormal results are displayed)  Labs Reviewed  CBC - Abnormal; Notable for the following components:      Result Value   RBC 4.03 (*)    Hemoglobin 12.5 (*)    HCT 38.6 (*)    All other components within normal limits  BASIC METABOLIC PANEL - Abnormal; Notable for the following components:   Glucose, Bld 110 (*)    BUN 39 (*)    Creatinine, Ser 1.45 (*)    Calcium 8.6 (*)    GFR calc non Af Amer 44 (*)    GFR calc Af Amer 51 (*)    All other components within normal limits   ____________________________________________  EKG  ED ECG REPORT I, Blake Divine, the attending physician, personally viewed and interpreted this ECG.   Date: 10/01/2019  EKG Time: 16:22  Rate: 71  Rhythm: normal sinus rhythm, frequent PVC's noted  Axis: Normal  Intervals:first-degree A-V block   ST&T Change:  None   PROCEDURES  Procedure(s) performed (including Critical Care):  Procedures   ____________________________________________   INITIAL IMPRESSION / ASSESSMENT AND PLAN / ED COURSE       85 year old male with history of Parkinson's presents to the ED after being notified of positive COVID-19 testing earlier today.  He appears relatively asymptomatic from this, complains of only some congestion but has no shortness of breath and is maintaining O2 sats on room air.  He is able to answer questions appropriately, seems only intermittently confused but wife states that he often has bouts of confusion secondary to dementia.  He has no focal neurologic deficits on exam and there is no indication for imaging of his head at this time.  We will check chest x-ray.  Labs are significant for mild AKI, which seems related to dehydration given elevated BUN, will hydrate with IV fluids.  Blood pressure was noted to be low in triage, improved once he returned to the room without intervention.  He does take midodrine regularly for blood pressure support, will give his evening dose and continue to monitor.  Patient's blood pressure consistently normotensive following his usual dose of midodrine.  He remains at his baseline mental status per wife, currently awake and alert, has tolerated p.o. without difficulty.  Chest x-ray negative for acute process.  At this time, he is appropriate for outpatient management, but I advised wife to observe him closely for any worsening symptoms.  Advised her to purchase a pulse oximeter and regularly check his oxygen levels, also to follow-up with his PCP.  Will prescribe albuterol and Zofran for symptomatic treatment.  Patient and wife agree with plan.      ____________________________________________   FINAL CLINICAL IMPRESSION(S) / ED DIAGNOSES  Final diagnoses:  COVID-19 virus detected  Dementia due to Parkinson's disease without behavioral disturbance ([place])   Dehydration     ED Discharge Orders         Ordered    ondansetron (ZOFRAN ODT) 4 MG disintegrating tablet  Every 8 hours PRN     10/01/19 1914    albuterol (VENTOLIN HFA) 108 (90 Base) MCG/ACT inhaler  Every 6 hours PRN     10/01/19  1914           Note:  This document was prepared using Systems analyst and may include unintentional dictation errors.   Blake Divine, MD 10/01/19 1919

## 2019-10-01 NOTE — Discharge Instructions (Signed)
Your chest x-ray today looks normal but we often do not recognize severe symptoms related to COVID-19 until you are about a week into the disease.  Please obtain a pulse oximeter and check your oxygen levels at least once a day, especially if you are feeling short of breath.  Please return to the ER if you find oxygen levels consistently below 90 to 92%.  Please also return to the ER for any other worsening symptoms including changes in mental status, chest pain, or any other concerning symptoms.  Please follow-up closely with your primary care doctor and self quarantine for 2 weeks.

## 2019-10-01 NOTE — Telephone Encounter (Signed)
FYI

## 2019-10-01 NOTE — ED Notes (Signed)
Wife administered pt home medications prior to pharmacy sending medications.

## 2019-10-02 ENCOUNTER — Encounter: Payer: Self-pay | Admitting: Adult Health

## 2019-10-02 ENCOUNTER — Ambulatory Visit (INDEPENDENT_AMBULATORY_CARE_PROVIDER_SITE_OTHER): Payer: Medicare Other | Admitting: Adult Health

## 2019-10-02 DIAGNOSIS — U071 COVID-19: Secondary | ICD-10-CM

## 2019-10-02 DIAGNOSIS — R0602 Shortness of breath: Secondary | ICD-10-CM

## 2019-10-02 DIAGNOSIS — I959 Hypotension, unspecified: Secondary | ICD-10-CM | POA: Insufficient documentation

## 2019-10-02 NOTE — Patient Instructions (Signed)
COVID-19 Frequently Asked Questions COVID-19 (coronavirus disease) is an infection that is caused by a large family of viruses. Some viruses cause illness in people and others cause illness in animals like camels, cats, and bats. In some cases, the viruses that cause illness in animals can spread to humans. Where did the coronavirus come from? In December 2019, [place] told the [facility]) of several cases of lung disease (human respiratory illness). These cases were linked to an open seafood and livestock market in the city of [place]. The link to the seafood and livestock market suggests that the virus may have spread from animals to humans. However, since that first outbreak in December, the virus has also been shown to spread from person to person. What is the name of the disease and the virus? Disease name Early on, this disease was called novel coronavirus. This is because scientists determined that the disease was caused by a new (novel) respiratory virus. The World Health Organization [facility]) has now named the disease COVID-19, or coronavirus disease. Virus name The virus that causes the disease is called severe acute respiratory syndrome coronavirus 2 (SARS-CoV-2). More information on disease and virus naming World Health Organization [facility]): www.who.int/emergencies/diseases/novel-coronavirus-2019/technical-guidance/naming-the-coronavirus-disease-(covid-2019)-and-the-virus-that-causes-it Who is at risk for complications from coronavirus disease? Some people may be at higher risk for complications from coronavirus disease. This includes older adults and people who have chronic diseases, such as heart disease, diabetes, and lung disease. If you are at higher risk for complications, take these extra precautions: Avoid close contact with people who are sick or have a fever or cough. Stay at least 3-6 ft (1-2 m) away from them, if possible. Wash your hands often with soap and water  for at least 20 seconds. Avoid touching your face, mouth, nose, or eyes. Keep supplies on hand at home, such as food, medicine, and cleaning supplies. Stay home as much as possible. Avoid social gatherings and travel. How does coronavirus disease spread? The virus that causes coronavirus disease spreads easily from person to person (is contagious). There are also cases of community-spread disease. This means the disease has spread to: People who have no known contact with other infected people. People who have not traveled to areas where there are known cases. It appears to spread from one person to another through droplets from coughing or sneezing. Can I get the virus from touching surfaces or objects? There is still a lot that we do not know about the virus that causes coronavirus disease. Scientists are basing a lot of information on what they know about similar viruses, such as: Viruses cannot generally survive on surfaces for long. They need a human body (host) to survive. It is more likely that the virus is spread by close contact with people who are sick (direct contact), such as through: Shaking hands or hugging. Breathing in respiratory droplets that travel through the air. This can happen when an infected person coughs or sneezes on or near other people. It is less likely that the virus is spread when a person touches a surface or object that has the virus on it (indirect contact). The virus may be able to enter the body if the person touches a surface or object and then touches his or her face, eyes, nose, or mouth. Can a person spread the virus without having symptoms of the disease? It may be possible for the virus to spread before a person has symptoms of the disease, but this is most likely not the main  way the virus is spreading. It is more likely for the virus to spread by being in close contact with people who are sick and breathing in the respiratory droplets of a sick person's  cough or sneeze. What are the symptoms of coronavirus disease? Symptoms vary from person to person and can range from mild to severe. Symptoms may include: Fever. Cough. Tiredness, weakness, or fatigue. Fast breathing or feeling short of breath. These symptoms can appear anywhere from 2 to 14 days after you have been exposed to the virus. If you develop symptoms, call your health care provider. People with severe symptoms may need hospital care. If I am exposed to the virus, how long does it take before symptoms start? Symptoms of coronavirus disease may appear anywhere from 2 to 14 days after a person has been exposed to the virus. If you develop symptoms, call your health care provider. Should I be tested for this virus? Your health care provider will decide whether to test you based on your symptoms, history of exposure, and your risk factors. How does a health care provider test for this virus? Health care providers will collect samples to send for testing. Samples may include: Taking a swab of fluid from the nose. Taking fluid from the lungs by having you cough up mucus (sputum) into a sterile cup. Taking a blood sample. Taking a stool or urine sample. Is there a treatment or vaccine for this virus? Currently, there is no vaccine to prevent coronavirus disease. Also, there are no medicines like antibiotics or antivirals to treat the virus. A person who becomes sick is given supportive care, which means rest and fluids. A person may also relieve his or her symptoms by using over-the-counter medicines that treat sneezing, coughing, and runny nose. These are the same medicines that a person takes for the common cold. If you develop symptoms, call your health care provider. People with severe symptoms may need hospital care. What can I do to protect myself and my family from this virus?     You can protect yourself and your family by taking the same actions that you would take to prevent  the spread of other viruses. Take the following actions: Wash your hands often with soap and water for at least 20 seconds. If soap and water are not available, use alcohol-based hand sanitizer. Avoid touching your face, mouth, nose, or eyes. Cough or sneeze into a tissue, sleeve, or elbow. Do not cough or sneeze into your hand or the air. If you cough or sneeze into a tissue, throw it away immediately and wash your hands. Disinfect objects and surfaces that you frequently touch every day. Avoid close contact with people who are sick or have a fever or cough. Stay at least 3-6 ft (1-2 m) away from them, if possible. Stay home if you are sick, except to get medical care. Call your health care provider before you get medical care. Make sure your vaccines are up to date. Ask your health care provider what vaccines you need. What should I do if I need to travel? Follow travel recommendations from your local health authority, the CDC, and WHO. Travel information and advice Centers for Disease Control and Prevention (CDC): BodyEditor.hu World Health Organization [facility]): ThirdIncome.ca Know the risks and take action to protect your health You are at higher risk of getting coronavirus disease if you are traveling to areas with an outbreak or if you are exposed to travelers from areas with an outbreak. Wash  your hands often and practice good hygiene to lower the risk of catching or spreading the virus. What should I do if I am sick? General instructions to stop the spread of infection Wash your hands often with soap and water for at least 20 seconds. If soap and water are not available, use alcohol-based hand sanitizer. Cough or sneeze into a tissue, sleeve, or elbow. Do not cough or sneeze into your hand or the air. If you cough or sneeze into a tissue, throw it away immediately and wash your hands. Stay home  unless you must get medical care. Call your health care provider or local health authority before you get medical care. Avoid public areas. Do not take public transportation, if possible. If you can, wear a mask if you must go out of the house or if you are in close contact with someone who is not sick. Keep your home clean Disinfect objects and surfaces that are frequently touched every day. This may include: Counters and tables. Doorknobs and light switches. Sinks and faucets. Electronics such as phones, remote controls, keyboards, computers, and tablets. Wash dishes in hot, soapy water or use a dishwasher. Air-dry your dishes. Wash laundry in hot water. Prevent infecting other household members Let healthy household members care for children and pets, if possible. If you have to care for children or pets, wash your hands often and wear a mask. Sleep in a different bedroom or bed, if possible. Do not share personal items, such as razors, toothbrushes, deodorant, combs, brushes, towels, and washcloths. Where to find more information Centers for Disease Control and Prevention (CDC) Information and news updates: https://www.butler-gonzalez.com/ World Health Organization [facility]) Information and news updates: MissExecutive.com.ee Coronavirus health topic: https://www.castaneda.info/ Questions and answers on COVID-19: OpportunityDebt.at Global tracker: who.sprinklr.com American Academy of Pediatrics (AAP) Information for families: www.healthychildren.org/English/health-issues/conditions/chest-lungs/Pages/2019-Novel-Coronavirus.aspx The coronavirus situation is changing rapidly. Check your local health authority website or the CDC and [facility] websites for updates and news. When should I contact a health care provider? Contact your health care provider if you have symptoms of an infection, such as fever or cough, and you:  Have been near anyone who is known to have coronavirus disease. Have come into contact with a person who is suspected to have coronavirus disease. Have traveled outside of the country. When should I get emergency medical care? Get help right away by calling your local emergency services (911 in the [place] if you have: Trouble breathing. Pain or pressure in your chest. Confusion. Blue-tinged lips and fingernails. Difficulty waking from sleep. Symptoms that get worse. Let the emergency medical personnel know if you think you have coronavirus disease. Summary A new respiratory virus is spreading from person to person and causing COVID-19 (coronavirus disease). The virus that causes COVID-19 appears to spread easily. It spreads from one person to another through droplets from coughing or sneezing. Older adults and those with chronic diseases are at higher risk of disease. If you are at higher risk for complications, take extra precautions. There is currently no vaccine to prevent coronavirus disease. There are no medicines, such as antibiotics or antivirals, to treat the virus. You can protect yourself and your family by washing your hands often, avoiding touching your face, and covering your coughs and sneezes. This information is not intended to replace advice given to you by your health care provider. Make sure you discuss any questions you have with your health care provider. Document Released: 02/05/2019 Document Revised: 02/05/2019 Document Reviewed: 02/05/2019 Elsevier Patient Education  [place]. Hypotension As your heart beats, it forces blood through your body. This force is called blood pressure. If you have hypotension, you have low blood pressure. When your blood pressure is too low, you may not get enough blood to your brain or other parts of your body. This may cause you to feel weak, light-headed, have a fast heartbeat, or even pass out (faint). Low blood pressure may be  harmless, or it may cause serious problems. What are the causes?  Blood loss.  Not enough water in the body (dehydration).  Heart problems.  Hormone problems.  Pregnancy.  A very bad infection.  Not having enough of certain nutrients.  Very bad allergic reactions.  Certain medicines. What increases the risk?  Age. The risk increases as you get older.  Conditions that affect the heart or the brain and spinal cord (central nervous system).  Taking certain medicines.  Being pregnant. What are the signs or symptoms?  Feeling: ? Weak. ? Light-headed. ? Dizzy. ? Tired (fatigued).  Blurred vision.  Fast heartbeat.  Passing out, in very bad cases. How is this treated?  Changing your diet. This may involve eating more salt (sodium) or drinking more water.  Taking medicines to raise your blood pressure.  Changing how much you take (the dosage) of some of your medicines.  Wearing compression stockings. These stockings help to prevent blood clots and reduce swelling in your legs. In some cases, you may need to go to the hospital for:  Fluid replacement. This means you will receive fluids through an IV tube.  Blood replacement. This means you will receive donated blood through an IV tube (transfusion).  Treating an infection or heart problems, if this applies.  Monitoring. You may need to be monitored while medicines that you are taking wear off. Follow these instructions at home: Eating and drinking   Drink enough fluids to keep your pee (urine) pale yellow.  Eat a healthy diet. Follow instructions from your doctor about what you can eat or drink. A healthy diet includes: ? Fresh fruits and vegetables. ? Whole grains. ? Low-fat (lean) meats. ? Low-fat dairy products.  Eat extra salt only as told. Do not add extra salt to your diet unless your doctor tells you to.  Eat small meals often.  Avoid standing up quickly after you eat. Medicines  Take  over-the-counter and prescription medicines only as told by your doctor. ? Follow instructions from your doctor about changing how much you take of your medicines, if this applies. ? Do not stop or change any of your medicines on your own. General instructions   Wear compression stockings as told by your doctor.  Get up slowly from lying down or sitting.  Avoid hot showers and a lot of heat as told by your doctor.  Return to your normal activities as told by your doctor. Ask what activities are safe for you.  Do not use any products that contain nicotine or tobacco, such as cigarettes, e-cigarettes, and chewing tobacco. If you need help quitting, ask your doctor.  Keep all follow-up visits as told by your doctor. This is important. Contact a doctor if:  You throw up (vomit).  You have watery poop (diarrhea).  You have a fever for more than 2-3 days.  You feel more thirsty than normal.  You feel weak and tired. Get help right away if:  You have chest pain.  You have a fast or uneven heartbeat.  You lose feeling (  have numbness) in any part of your body.  You cannot move your arms or your legs.  You have trouble talking.  You get sweaty or feel light-headed.  You pass out.  You have trouble breathing.  You have trouble staying awake.  You feel mixed up (confused). Summary  Hypotension is also called low blood pressure. It is when the force of blood pumping through your arteries is too weak.  Hypotension may be harmless, or it may cause serious problems.  Treatment may include changing your diet and medicines, and wearing compression stockings.  In very bad cases, you may need to go to the hospital. This information is not intended to replace advice given to you by your health care provider. Make sure you discuss any questions you have with your health care provider. Document Released: 01/04/2010 Document Revised: 04/05/2018 Document Reviewed: 04/05/2018  Elsevier Patient Education  2020 [employer].

## 2019-10-02 NOTE — Progress Notes (Addendum)
Virtual Visit via Telephone Note  I connected with [name] on 10/02/19 at  8:00 AM EST by telephone and verified that I am speaking with the correct person using two identifiers. Spoke with patient via speaking  and wife present on HIPPA form speaking as well.    I discussed the limitations, risks, security and privacy concerns of performing an evaluation and management service by telephone and the availability of in person appointments. I also discussed with the patient that there may be a patient responsible charge related to this service. The patient expressed understanding and agreed to proceed.    I discussed the assessment and treatment plan with the patient. The patient was provided an opportunity to ask questions and all were answered. The patient agreed with the plan and demonstrated an understanding of the instructions.   The patient was advised to call back or seek an in-person evaluation if the symptoms worsen or if the condition fails to improve as anticipated.  I provided 15 minutes of non-face-to-face time during this encounter.   Marcille Buffy, [place]  ED follow up.  Subjective:    Patient ID: Luke Durham, male    DOB: [birth year], 85 y.o.   MRN: [id number]  No chief complaint on file.   HPI Patient for virtual visit by telephone, he was tested positive for Covid 19 on 10/01/2019.Spoke with Pincus Badder his wife who reports he is not doing any worse than when he was at the emergency room. He was seen in the emergency room and discharged home same day suitable for outpatient care. Per wife he is still at baseline mentally with some cognitive impairment as previously diagnosed as he was in the emergency room yesterday.  Chest x ray was negative.  He was hypotensive in the ER 85/54 , heart rate was 64 , 99 % oxygen saturation.  Chest congestion continues and wife denies any worsening, denies any worsening shortness of breath or any shortness of breath at time of  phone call.   She reports they have not gotten inhaler filled and not purchased a pulse oximetry but plans to have someone bring it to there home and drop off.  She reports he is eating and drinking.  Denies any falls.  Urinating normally   Wife is by husbands bed speaking to him, provider can hear them conversing in the background. Wife reports patient does not want to hold the phone for visit. She nor patient has any concerns for his symptoms. She reports chronic difficulty getting up and down and ambulating due to parkinson's. Followed by Dr. Manuella Ghazi neurology/ [place].   Last documented B/P at neurology appointment was 89/49 and heart rate 66.  Patient and wife   denies any fever,chills, rash, shortness of breath.  chest pain,  nausea, vomiting, or diarrhea.    FINAL CLINICAL IMPRESSION(S) / ED DIAGNOSES  Final diagnoses:  [id number] virus detected  Dementia due to Parkinson's disease without behavioral disturbance ([place])  Dehydration        ED Discharge Orders               Ordered     ondansetron (ZOFRAN ODT) 4 MG disintegrating tablet  Every 8 hours PRN     10/01/19 1914     albuterol (VENTOLIN HFA) 108 (90 Base) MCG/ACT inhaler  Every 6 hours PRN     10/01/19 1914        Study Result  CLINICAL DATA:  Cough and shortness of breath, history of COVID-19 positivity  EXAM: PORTABLE CHEST 1 VIEW  COMPARISON:  None.  FINDINGS: Cardiac shadow is at the upper limits of normal in size. The lungs are well aerated bilaterally. No focal infiltrate or sizable effusion is seen. No bony abnormality is noted.  IMPRESSION: No acute abnormality noted.   Electronically Signed   By: Inez Catalina M.D.   On: 10/01/2019 17:26     Past Medical History:  Diagnosis Date  . Anxiety   . Arthritis   . Asthma    childhood asthma  . Cancer ([place]) 7 years ago   lymphoma, [place] (recent)  . Chronic kidney disease   . Colon polyps   . GERD (gastroesophageal reflux  disease)   . History of kidney stones   . Parkinson disease [facility])     Past Surgical History:  Procedure Laterality Date  . COLON SURGERY    . CYSTOSCOPY WITH LITHOLAPAXY N/A 11/14/2016   Procedure: CYSTOSCOPY WITH LITHOLAPAXY;  Surgeon: Hollice Espy, MD;  Location: [facility] ORS;  Service: Urology;  Laterality: N/A;  . EYE SURGERY Bilateral    Cataract Extraction with IOL  . HERNIA REPAIR  20 years  . MOHS SURGERY    . SMALL INTESTINE SURGERY     Per patient 7 years  . TRANSURETHRAL RESECTION OF PROSTATE N/A 11/14/2016   Procedure: TRANSURETHRAL RESECTION OF THE PROSTATE (TURP);  Surgeon: Hollice Espy, MD;  Location: [facility] ORS;  Service: Urology;  Laterality: N/A;    Family History  Problem Relation Age of Onset  . Heart disease Father   . Bladder Cancer Neg Hx   . Prostate cancer Neg Hx   . Kidney cancer Neg Hx     Social History   Socioeconomic History  . Marital status: Married    Spouse name: Not on file  . Number of children: 2  . Years of education: Not on file  . Highest education level: Bachelor's degree (e.g., BA, AB, BS)  Occupational History  . Occupation: retired  Scientific laboratory technician  . Financial resource strain: Not hard at all  . Food insecurity    Worry: Never true    Inability: Never true  . Transportation needs    Medical: No    Non-medical: No  Tobacco Use  . Smoking status: Never Smoker  . Smokeless tobacco: Never Used  Substance and Sexual Activity  . Alcohol use: Yes    Alcohol/week: 0.0 - 7.0 standard drinks  . Drug use: No  . Sexual activity: Not on file  Lifestyle  . Physical activity    Days per week: 0 days    Minutes per session: 0 min  . Stress: Only a little  Relationships  . Social Herbalist on phone: Patient refused    Gets together: Patient refused    Attends religious service: Patient refused    Active member of club or organization: Patient refused    Attends meetings of clubs or organizations: Patient refused     Relationship status: Patient refused  . Intimate partner violence    Fear of current or ex partner: Patient refused    Emotionally abused: Patient refused    Physically abused: Patient refused    Forced sexual activity: Patient refused  Other Topics Concern  . Not on file  Social History Narrative  . Not on file    Outpatient Medications Prior to Visit  Medication Sig Dispense Refill  . albuterol (VENTOLIN HFA) 108 (90 Base) MCG/ACT inhaler Inhale 2 puffs into the lungs every  6 (six) hours as needed for wheezing or shortness of breath. 8 g 0  . carbidopa-levodopa (SINEMET IR) 25-250 MG tablet Take 2 tablets by mouth 4 (four) times daily.    . Carboxymethylcellulose Sodium (THERATEARS OP) Place 1-2 drops into both eyes at bedtime.    . cholecalciferol (VITAMIN D) 1000 units tablet Take 1,000 Units by mouth daily.    Marland Kitchen DOK 100 MG capsule TAKE 1 CAPSULE(100 MG) BY MOUTH TWICE DAILY (Patient taking differently: 100 mg 2 (two) times daily. Docusate sodium) 60 capsule 11  . donepezil (ARICEPT) 10 MG tablet Take 10 mg by mouth at bedtime.     Marland Kitchen econazole nitrate 1 % cream Apply 1 application topically daily. Applied to feet 85 g 0  . finasteride (PROSCAR) 5 MG tablet Take 1 tablet (5 mg total) by mouth daily. 30 tablet 11  . furosemide (LASIX) 20 MG tablet take 1 tablet by mouth once daily 90 tablet 3  . midodrine (PROAMATINE) 5 MG tablet Take 1 tablet (5 mg total) by mouth 3 (three) times daily with meals. 270 tablet 1  . Multiple Vitamin (MULTIVITAMIN) capsule Take 1 capsule by mouth daily.    . Multiple Vitamins-Minerals (OCUVITE PO) Take 1 tablet by mouth daily.     . niacinamide 500 MG tablet   0  . ondansetron (ZOFRAN ODT) 4 MG disintegrating tablet Take 1 tablet (4 mg total) by mouth every 8 (eight) hours as needed for nausea or vomiting. 12 tablet 0  . Pimavanserin Tartrate (NUPLAZID) 17 MG TABS Take 17 mg by mouth daily.     Debby Freiberg Tartrate (NUPLAZID) 34 MG CAPS Take 34 mg by  mouth daily.    . sertraline (ZOLOFT) 50 MG tablet TAKE 1 TABLET BY MOUTH EVERY DAY (Patient taking differently: Take 100 mg by mouth daily. ) 90 tablet 1   No facility-administered medications prior to visit.     No Known Allergies  Review of Systems  Constitutional: Positive for malaise/fatigue. Negative for chills, diaphoresis, fever and weight loss.  HENT: Positive for congestion. Negative for ear discharge, ear pain, hearing loss, nosebleeds, sinus pain, sore throat and tinnitus.   Respiratory: Positive for cough (ocacsional ). Negative for hemoptysis, sputum production, shortness of breath, wheezing and stridor.   Cardiovascular: Negative for chest pain, palpitations, orthopnea, claudication, leg swelling and PND.  Gastrointestinal: Positive for nausea. Negative for abdominal pain, blood in stool, constipation, diarrhea, heartburn, melena and vomiting.  Genitourinary: Negative.   Musculoskeletal: Negative.   Skin: Negative for rash.  Neurological: Positive for weakness.       Ongoing parkinson's symptoms  no change per wife   Psychiatric/Behavioral:       Mild cognitive impairment documented no change per wife.        Objective:    Physical Exam   No vitals reported or available. Patient is alert and oriented and responsive to questions asked of him by his wife. She denies any mental changes. Engages in conversation his wife while provider speaks via speaker phone.Patient sounds to be speaking  in full sentences without any pauses without any shortness of breath or distress.  He is resting in bed and drinking per wife.   There were no vitals taken for this visit. Wt Readings from Last 3 Encounters:  10/01/19 165 lb (74.8 kg)  06/27/19 166 lb (75.3 kg)  10/02/18 163 lb (73.9 kg)    There are no preventive care reminders to display for this patient.  There are no  preventive care reminders to display for this patient.   Lab Results  Component Value Date   TSH 1.220  06/27/2019   Lab Results  Component Value Date   WBC 4.1 10/01/2019   HGB 12.5 (L) 10/01/2019   HCT 38.6 (L) 10/01/2019   MCV 95.8 10/01/2019   PLT 187 10/01/2019   Lab Results  Component Value Date   NA 137 10/01/2019   K 4.2 10/01/2019   CO2 26 10/01/2019   GLUCOSE 110 (H) 10/01/2019   BUN 39 (H) 10/01/2019   CREATININE 1.45 (H) 10/01/2019   BILITOT 0.4 06/27/2019   ALKPHOS 49 06/27/2019   AST 20 06/27/2019   ALT 8 06/27/2019   PROT 6.4 06/27/2019   ALBUMIN 4.4 06/27/2019   CALCIUM 8.6 (L) 10/01/2019   ANIONGAP 11 10/01/2019   Lab Results  Component Value Date   CHOL 179 06/27/2019   Lab Results  Component Value Date   HDL 48 06/27/2019   Lab Results  Component Value Date   LDLCALC 117 (H) 06/27/2019   Lab Results  Component Value Date   TRIG 72 06/27/2019   Lab Results  Component Value Date   CHOLHDL 3.7 06/27/2019   Lab Results  Component Value Date   HGBA1C 5.3 06/27/2019       Assessment & Plan:   1. Hypotension, unspecified hypotension type Continue to rest and hydrate. Discussed parameters for returning to the emergency room.  Call neurology for follow up discussion with worsening Parkinson symptoms/ medication review with hypotension. Appears consistent with patients history.    2. Lab test positive for detection of COVID-19 virus Discussed rest and hydration. Negative Chest X Ray. Discussed returning to the emergency room if symptoms worsening. Discussed [place]. Discussed quarantine and only going out for medical care if needed.   3. Shortness of breath Was evaluated on 10/01/2019- covid positive. Has inhaler prescription. Wife denied any worsening symptoms.    Return in about 3 days (around 10/05/2019), or if symptoms worsen or fail to improve, for Go to Emergency room/ urgent care if worse, at any time for any worsening symptoms. Wife verbalized understanding of all instructions.   Advised patient call the office or your primary care  doctor for an appointment if no improvement within 72 hours or if any symptoms change or worsen at any time  Advised ER or urgent Care if after hours or on weekend. Call 911 for emergency symptoms at any time.Patinet verbalized understanding of all instructions given/reviewed and treatment plan and has no further questions or concerns at this time.       No orders of the defined types were placed in this encounter.    Marcille Buffy, FNP

## 2019-10-03 ENCOUNTER — Ambulatory Visit: Payer: Medicare Other | Admitting: Urology

## 2019-12-08 ENCOUNTER — Ambulatory Visit: Payer: Medicare Other | Attending: Internal Medicine

## 2019-12-08 DIAGNOSIS — Z23 Encounter for immunization: Secondary | ICD-10-CM | POA: Insufficient documentation

## 2019-12-08 NOTE — Progress Notes (Signed)
   Covid-19 Vaccination Clinic  Name:  Januel Nuon    MRN: [id number] DOB: Jul 30, 1933  12/08/2019  Mr. Koob was observed post Covid-19 immunization for 15 minutes without incidence. He was provided with Vaccine Information Sheet and instruction to access the V-Safe system.   Mr. Sherfield was instructed to call 911 with any severe reactions post vaccine: Marland Kitchen Difficulty breathing  . Swelling of your face and throat  . A fast heartbeat  . A bad rash all over your body  . Dizziness and weakness    Immunizations Administered    Name Date Dose VIS Date Route   Pfizer COVID-19 Vaccine 12/08/2019 11:14 AM 0.3 mL 10/04/2019 Intramuscular   Manufacturer: [place]   Lot: [id number]   [place]: [id number]

## 2019-12-31 ENCOUNTER — Telehealth: Payer: Self-pay | Admitting: Adult Health Nurse Practitioner

## 2019-12-31 NOTE — Telephone Encounter (Signed)
Spoke with patient's wife Luke Durham, regarding Palliative services and all questions were answered and she was in agreement with beginning services.  I have scheduled an In-person Consult for 01/06/20 @ 3:30 PM

## 2020-01-06 ENCOUNTER — Telehealth: Payer: Self-pay | Admitting: Adult Health Nurse Practitioner

## 2020-01-06 ENCOUNTER — Other Ambulatory Visit: Payer: Self-pay

## 2020-01-06 ENCOUNTER — Other Ambulatory Visit: Payer: Medicare Other | Admitting: Adult Health Nurse Practitioner

## 2020-01-06 NOTE — Telephone Encounter (Signed)
Called patient's daughter Magda Paganini, to obtain her email address so that I could send her our [place] - as requested by patient's wife, no answer.  Left message with reason for call along with my name and contact number.

## 2020-01-06 NOTE — Telephone Encounter (Signed)
Rec'd call from patient's wife Luke Durham, wanting to cancel the Palliative Consult for today.  She requested that I send additional information about Palliative services to her daughter Luke Durham and then they will decide if they want to pursue Palliative services or not.

## 2020-01-07 ENCOUNTER — Ambulatory Visit: Payer: Medicare Other | Attending: Internal Medicine

## 2020-01-07 DIAGNOSIS — Z23 Encounter for immunization: Secondary | ICD-10-CM

## 2020-01-07 NOTE — Progress Notes (Signed)
   Covid-19 Vaccination Clinic  Name:  Luke Durham    MRN: [id number] DOB: 09/24/1933  01/07/2020  [name] was observed post Covid-19 immunization for 15 minutes without incident. He was provided with Vaccine Information Sheet and instruction to access the V-Safe system.   [name] was instructed to call 911 with any severe reactions post vaccine: Marland Kitchen Difficulty breathing  . Swelling of face and throat  . A fast heartbeat  . A bad rash all over body  . Dizziness and weakness   Immunizations Administered    Name Date Dose VIS Date Route   Pfizer COVID-19 Vaccine 01/07/2020 12:24 PM 0.3 mL 10/04/2019 Intramuscular   Manufacturer: [place]   Lot: [id number]   [place]: [id number]

## 2020-01-09 ENCOUNTER — Ambulatory Visit: Payer: Medicare Other | Admitting: Physician Assistant

## 2020-01-16 ENCOUNTER — Other Ambulatory Visit: Payer: Self-pay | Admitting: Neurology

## 2020-01-16 DIAGNOSIS — G20 Parkinson's disease: Secondary | ICD-10-CM

## 2020-01-17 ENCOUNTER — Telehealth: Payer: Self-pay | Admitting: Adult Health Nurse Practitioner

## 2020-01-17 NOTE — Telephone Encounter (Signed)
Called patient's daughter Magda Paganini, to see if she had rec'd the Palliative Brochure that I had emailed her and to see if they wanted to pursue Palliative services or not, no answer - left message with my name and contact number for return call.

## 2020-01-23 ENCOUNTER — Encounter: Payer: Self-pay | Admitting: Physician Assistant

## 2020-01-23 ENCOUNTER — Ambulatory Visit (INDEPENDENT_AMBULATORY_CARE_PROVIDER_SITE_OTHER): Payer: Medicare Other | Admitting: Physician Assistant

## 2020-01-23 ENCOUNTER — Other Ambulatory Visit: Payer: Self-pay

## 2020-01-23 VITALS — BP 110/54 | HR 71 | Temp 97.4°F | Resp 18 | Ht 67.0 in | Wt 164.0 lb

## 2020-01-23 DIAGNOSIS — E559 Vitamin D deficiency, unspecified: Secondary | ICD-10-CM

## 2020-01-23 DIAGNOSIS — G20 Parkinson's disease: Secondary | ICD-10-CM | POA: Diagnosis not present

## 2020-01-23 DIAGNOSIS — R7989 Other specified abnormal findings of blood chemistry: Secondary | ICD-10-CM

## 2020-01-23 DIAGNOSIS — R5382 Chronic fatigue, unspecified: Secondary | ICD-10-CM

## 2020-01-23 DIAGNOSIS — Z23 Encounter for immunization: Secondary | ICD-10-CM

## 2020-01-23 DIAGNOSIS — G903 Multi-system degeneration of the autonomic nervous system: Secondary | ICD-10-CM | POA: Diagnosis not present

## 2020-01-23 MED ORDER — TETANUS-DIPHTHERIA TOXOIDS TD 5-2 LFU IM INJ: 0.5000 mL | INJECTION | Freq: Once | INTRAMUSCULAR | 0 refills | Status: AC

## 2020-01-23 NOTE — Patient Instructions (Signed)

## 2020-01-23 NOTE — Progress Notes (Signed)
Patient: Luke Durham Male    DOB: 11/30/1932   86 y.o.   MRN: [id number] Visit Date: 01/23/2020  Today's Provider: Mar Daring, PA-C   Chief Complaint  Patient presents with  . Follow-up   Subjective:     HPI   Neurogenic orthostatic hypotension ([place]) From 03/08/2018-Followed by Neurology. Discussed good hydration. Labs checked showing-stable.  Parkinson's syndrome ([place]) From 03/08/2018-Stable. Has PT for movement. Followed by Neurology.   Dementia due to Parkinson's disease without behavioral disturbance ([place]) From 03/08/2018-Seemingly stable. Followed by Neurology.   Having increased fatigue and requesting to have some labs drawn.   No Known Allergies   Current Outpatient Medications:  .  Carboxymethylcellulose Sodium (THERATEARS OP), Place 1-2 drops into both eyes at bedtime., Disp: , Rfl:  .  cholecalciferol (VITAMIN D) 1000 units tablet, Take 1,000 Units by mouth daily., Disp: , Rfl:  .  DOK 100 MG capsule, TAKE 1 CAPSULE(100 MG) BY MOUTH TWICE DAILY (Patient taking differently: 100 mg 2 (two) times daily. Docusate sodium), Disp: 60 capsule, Rfl: 11 .  econazole nitrate 1 % cream, Apply 1 application topically daily. Applied to feet, Disp: 85 g, Rfl: 0 .  finasteride (PROSCAR) 5 MG tablet, Take 1 tablet (5 mg total) by mouth daily., Disp: 30 tablet, Rfl: 11 .  midodrine (PROAMATINE) 5 MG tablet, Take 1 tablet (5 mg total) by mouth 3 (three) times daily with meals., Disp: 270 tablet, Rfl: 1 .  Multiple Vitamin (MULTIVITAMIN) capsule, Take 1 capsule by mouth daily., Disp: , Rfl:  .  Pimavanserin Tartrate (NUPLAZID) 34 MG CAPS, Take 34 mg by mouth daily., Disp: , Rfl:  .  albuterol (VENTOLIN HFA) 108 (90 Base) MCG/ACT inhaler, Inhale 2 puffs into the lungs every 6 (six) hours as needed for wheezing or shortness of breath. (Patient not taking: Reported on 01/23/2020), Disp: 8 g, Rfl: 0 .  carbidopa-levodopa (SINEMET IR) 25-250 MG tablet, Take 2 tablets by mouth  4 (four) times daily., Disp: , Rfl:  .  donepezil (ARICEPT) 10 MG tablet, Take 10 mg by mouth at bedtime. , Disp: , Rfl:  .  furosemide (LASIX) 20 MG tablet, take 1 tablet by mouth once daily (Patient not taking: Reported on 01/23/2020), Disp: 90 tablet, Rfl: 3 .  Multiple Vitamins-Minerals (OCUVITE PO), Take 1 tablet by mouth daily. , Disp: , Rfl:  .  niacinamide 500 MG tablet, , Disp: , Rfl: 0 .  ondansetron (ZOFRAN ODT) 4 MG disintegrating tablet, Take 1 tablet (4 mg total) by mouth every 8 (eight) hours as needed for nausea or vomiting. (Patient not taking: Reported on 01/23/2020), Disp: 12 tablet, Rfl: 0 .  Pimavanserin Tartrate (NUPLAZID) 17 MG TABS, Take 17 mg by mouth daily. , Disp: , Rfl:  .  sertraline (ZOLOFT) 50 MG tablet, TAKE 1 TABLET BY MOUTH EVERY DAY (Patient taking differently: Take 100 mg by mouth daily. ), Disp: 90 tablet, Rfl: 1  Review of Systems  Constitutional: Positive for fatigue. Negative for appetite change, chills and fever.  Respiratory: Negative for chest tightness, shortness of breath and wheezing.   Cardiovascular: Negative for chest pain and palpitations.  Gastrointestinal: Negative for abdominal pain, nausea and vomiting.  Neurological: Positive for tremors, weakness and numbness.    Social History   Tobacco Use  . Smoking status: Never Smoker  . Smokeless tobacco: Never Used  Substance Use Topics  . Alcohol use: Yes    Alcohol/week: 0.0 - 7.0 standard drinks  Objective:   BP (!) 110/54 (BP Location: Left Arm, Patient Position: Sitting, Cuff Size: Large)   Pulse 71   Temp (!) 97.4 F (36.3 C) (Other (Comment))   Resp 18   Ht 5\' 7"  (1.702 m)   Wt 164 lb (74.4 kg)   SpO2 97%   BMI 25.69 kg/m  Vitals:   01/23/20 1635  BP: (!) 110/54  Pulse: 71  Resp: 18  Temp: (!) 97.4 F (36.3 C)  TempSrc: Other (Comment)  SpO2: 97%  Weight: 164 lb (74.4 kg)  Height: 5\' 7"  (1.702 m)  Body mass index is 25.69 kg/m.   Physical Exam Vitals reviewed.   Constitutional:      General: He is not in acute distress.    Appearance: Normal appearance. He is well-developed. He is not ill-appearing.  HENT:     Head: Normocephalic and atraumatic.  Eyes:     General: No scleral icterus.    Conjunctiva/sclera: Conjunctivae normal.  Pulmonary:     Effort: Pulmonary effort is normal. No respiratory distress.  Musculoskeletal:     Cervical back: Normal range of motion and neck supple.  Neurological:     Mental Status: He is alert.  Psychiatric:        Mood and Affect: Mood normal.        Behavior: Behavior normal.        Thought Content: Thought content normal.        Judgment: Judgment normal.      No results found for any visits on 01/23/20.     Assessment & Plan    1. Chronic fatigue Will check labs as below and f/u pending results. - CBC w/Diff/Platelet - Comprehensive Metabolic Panel (CMET) - Vitamin D (25 hydroxy) - B12 and Folate Panel - TSH + free T4  2. Neurogenic orthostatic hypotension ([facility]) Stable. Continue to push fluids. Will check labs as below and f/u pending results. Will check labs as below and f/u pending results. - CBC w/Diff/Platelet - Comprehensive Metabolic Panel (CMET) - 123456 and Folate Panel - TSH + free T4  3. Parkinson's syndrome ([place]) Stable. Followed by Neurology. Will check labs as below and f/u pending results. - CBC w/Diff/Platelet - Comprehensive Metabolic Panel (CMET) - 123456 and Folate Panel - TSH + free T4  4. Elevated serum creatinine Bumped with dehydration in December when he had Covid 19. Will check labs as below and f/u pending results to make sure returned to baseline.  - CBC w/Diff/Platelet - Comprehensive Metabolic Panel (CMET)  5. Vitamin D deficiency H/O this and on supplementation. Will check labs as below and f/u pending results. - Vitamin D (25 hydroxy)  6. Need for tetanus booster Needs Td booster but needs to get at [facility]. Rx sent for Td booster.  - tetanus &  diphtheria toxoids, adult, (TENIVAC) 5-2 LFU injection; Inject 0.5 mLs into the muscle once for 1 dose.  Dispense: 0.5 mL; Refill: 0     Mar Daring, PA-C  [facility]

## 2020-01-27 ENCOUNTER — Telehealth: Payer: Self-pay | Admitting: Adult Health Nurse Practitioner

## 2020-01-27 NOTE — Telephone Encounter (Signed)
Spoke with patient's daughter Magda Paganini to see if she had rec'd the Palliative brochure that I emailed her and she did but they have declined Palliative services at this time.

## 2020-01-29 ENCOUNTER — Ambulatory Visit: Payer: Medicare Other

## 2020-02-17 ENCOUNTER — Ambulatory Visit (INDEPENDENT_AMBULATORY_CARE_PROVIDER_SITE_OTHER): Payer: Medicare Other | Admitting: Physician Assistant

## 2020-02-17 ENCOUNTER — Ambulatory Visit: Payer: Self-pay

## 2020-02-17 DIAGNOSIS — Z23 Encounter for immunization: Secondary | ICD-10-CM

## 2020-02-17 DIAGNOSIS — S0101XA Laceration without foreign body of scalp, initial encounter: Secondary | ICD-10-CM

## 2020-02-17 DIAGNOSIS — W19XXXA Unspecified fall, initial encounter: Secondary | ICD-10-CM

## 2020-02-17 NOTE — Telephone Encounter (Signed)
Yes we can update his tetanus. I will order from this telephone visit.

## 2020-02-17 NOTE — Telephone Encounter (Signed)
Jenni-per PEC the wife said he was checked out by EMS and is ok.  They just want to know if he can get his Tetanus shot while here today for labs

## 2020-02-17 NOTE — Telephone Encounter (Signed)
  Pt.'s wife reports he fell getting into bed this morning at 0700. EMS came and "checked him out and said he looked fine." Has a small head laceration, 1/2 inch. Wife states he "probably needs a tetanus booster." She is planning to bring him into the lab at 3:00 today for lab work. Would a nurse to see laceration and give tetanus shot. Spoke with Jiles Garter in the practice and will forward note for review. Please advise wife. Answer Assessment - Initial Assessment Questions 1. MECHANISM: "How did the injury happen?" For falls, ask: "What height did you fall from?" and "What surface did you fall against?"      Fell getting into bed this morning and hit the chest next to bed 2. ONSET: "When did the injury happen?" (Minutes or hours ago)      0700 this morning 3. NEUROLOGIC SYMPTOMS: "Was there any loss of consciousness?" "Are there any other neurological symptoms?"      No 4. MENTAL STATUS: "Does the person know who he is, who you are, and where he is?"      Seems like his normal self 5. LOCATION: "What part of the head was hit?"      On top of head to left 6. SCALP APPEARANCE: "What does the scalp look like? Is it bleeding now?" If so, ask: "Is it difficult to stop?"      Puncture 1/2 inch 7. SIZE: For cuts, bruises, or swelling, ask: "How large is it?" (e.g., inches or centimeters)      1/2 inch 8. PAIN: "Is there any pain?" If so, ask: "How bad is it?"  (e.g., Scale 1-10; or mild, moderate, severe)     Not complaining 9. TETANUS: For any breaks in the skin, ask: "When was the last tetanus booster?"     Unsure 10. OTHER SYMPTOMS: "Do you have any other symptoms?" (e.g., neck pain, vomiting)       No 11. PREGNANCY: "Is there any chance you are pregnant?" "When was your last menstrual period?"       n/a  Protocols used: HEAD INJURY-A-AH

## 2020-02-18 ENCOUNTER — Telehealth: Payer: Self-pay

## 2020-02-18 LAB — CBC WITH DIFFERENTIAL/PLATELET
Basophils Absolute: 0 10*3/uL (ref 0.0–0.2)
Basos: 1 %
EOS (ABSOLUTE): 0.1 10*3/uL (ref 0.0–0.4)
Eos: 2 %
Hematocrit: 35.6 % — ABNORMAL LOW (ref 37.5–51.0)
Hemoglobin: 12.1 g/dL — ABNORMAL LOW (ref 13.0–17.7)
Immature Grans (Abs): 0 10*3/uL (ref 0.0–0.1)
Immature Granulocytes: 1 %
Lymphocytes Absolute: 1.2 10*3/uL (ref 0.7–3.1)
Lymphs: 19 %
MCH: 31.8 pg (ref 26.6–33.0)
MCHC: 34 g/dL (ref 31.5–35.7)
MCV: 93 fL (ref 79–97)
Monocytes Absolute: 0.6 10*3/uL (ref 0.1–0.9)
Monocytes: 9 %
Neutrophils Absolute: 4.4 10*3/uL (ref 1.4–7.0)
Neutrophils: 68 %
Platelets: 218 10*3/uL (ref 150–450)
RBC: 3.81 x10E6/uL — ABNORMAL LOW (ref 4.14–5.80)
RDW: 13.1 % (ref 11.6–15.4)
WBC: 6.4 10*3/uL (ref 3.4–10.8)

## 2020-02-18 LAB — COMPREHENSIVE METABOLIC PANEL
ALT: 11 IU/L (ref 0–44)
AST: 26 IU/L (ref 0–40)
Albumin/Globulin Ratio: 2.1 (ref 1.2–2.2)
Albumin: 4.5 g/dL (ref 3.6–4.6)
Alkaline Phosphatase: 63 IU/L (ref 39–117)
BUN/Creatinine Ratio: 35 — ABNORMAL HIGH (ref 10–24)
BUN: 33 mg/dL — ABNORMAL HIGH (ref 8–27)
Bilirubin Total: 0.3 mg/dL (ref 0.0–1.2)
CO2: 24 mmol/L (ref 20–29)
Calcium: 9 mg/dL (ref 8.6–10.2)
Chloride: 102 mmol/L (ref 96–106)
Creatinine, Ser: 0.94 mg/dL (ref 0.76–1.27)
GFR calc Af Amer: 86 mL/min/{1.73_m2} (ref >59)
GFR calc non Af Amer: 74 mL/min/{1.73_m2} (ref >59)
Globulin, Total: 2.1 g/dL (ref 1.5–4.5)
Glucose: 85 mg/dL (ref 65–99)
Potassium: 4.1 mmol/L (ref 3.5–5.2)
Sodium: 140 mmol/L (ref 134–144)
Total Protein: 6.6 g/dL (ref 6.0–8.5)

## 2020-02-18 LAB — VITAMIN D 25 HYDROXY (VIT D DEFICIENCY, FRACTURES): Vit D, 25-Hydroxy: 39.2 ng/mL (ref 30.0–100.0)

## 2020-02-18 LAB — TSH+FREE T4
Free T4: 0.79 ng/dL — ABNORMAL LOW (ref 0.82–1.77)
TSH: 2.24 u[IU]/mL (ref 0.450–4.500)

## 2020-02-18 LAB — B12 AND FOLATE PANEL
Folate: >20 ng/mL (ref >3.0)
Vitamin B-12: 673 pg/mL (ref 232–1245)

## 2020-02-18 NOTE — Telephone Encounter (Signed)
-----   Message from Mar Daring, PA-C sent at 02/18/2020 11:09 AM EDT ----- Blood count is stable. Kidney function has improved and is back to baseline. Liver enzymes are normal. Sodium, potassium and calcium are normal. Vit d is normal. B12 and folate are normal. Thyroid is normal.

## 2020-02-18 NOTE — Telephone Encounter (Signed)
Patient's wife advised as directed below. 

## 2020-02-20 ENCOUNTER — Ambulatory Visit: Payer: Medicare Other | Admitting: Physician Assistant

## 2020-03-16 NOTE — Progress Notes (Signed)
Subjective:   Tripp Beltram is a 86 y.o. male who presents for Medicare Annual/Subsequent preventive examination.  I connected with Apollo Macher (wife) today by telephone and verified that I am speaking with the correct person using two identifiers. Location patient: home Location provider: work Persons participating in the virtual visit: patient, provider.   I discussed the limitations, risks, security and privacy concerns of performing an evaluation and management service by telephone and the availability of in person appointments. I also discussed with the patient that there may be a patient responsible charge related to this service. The patient expressed understanding and verbally consented to this telephonic visit.    Interactive audio and video telecommunications were attempted between this provider and patient, however failed, due to patient having technical difficulties OR patient did not have access to video capability.  We continued and completed visit with audio only.   Review of Systems:  N/A  Cardiac Risk Factors include: advanced age (>36men, >78 women)     Objective:    Vitals: There were no vitals taken for this visit.  There is no height or weight on file to calculate BMI.  Advanced Directives 03/18/2020 10/01/2019 03/13/2019 11/07/2018 03/08/2018 02/10/2017 11/14/2016  Does Patient Have a Medical Advance Directive? Yes Yes Yes Yes Yes Yes Yes  Type of Paramedic of [place];Living will Living will;Healthcare Power of [place];Living will [place];Living will [place];Living will [place];Living will [place]  Does patient want to make changes to medical advance directive? - No - Patient declined - No - Patient declined - - No - Patient declined  Copy of [place] in Chart? No - copy requested No - copy requested No -  copy requested No - copy requested No - copy requested No - copy requested No - copy requested  Would patient like information on creating a medical advance directive? - No - Patient declined - - - - -    Tobacco Social History   Tobacco Use  Smoking Status Never Smoker  Smokeless Tobacco Never Used     Counseling given: Not Answered   Clinical Intake:  Pre-visit preparation completed: Yes  Pain : No/denies pain Pain Score: 0-No pain     Nutritional Risks: None Diabetes: No  How often do you need to have someone help you when you read instructions, pamphlets, or other written materials from your doctor or pharmacy?: 1 - Never  Interpreter Needed?: No  Information entered by :: [facility], LPN  Past Medical History:  Diagnosis Date  . Anxiety   . Arthritis   . Asthma    childhood asthma  . Cancer ([place]) 7 years ago   lymphoma, [place] (recent)  . Chronic kidney disease   . Colon polyps   . GERD (gastroesophageal reflux disease)   . History of kidney stones   . Parkinson disease [facility])    Past Surgical History:  Procedure Laterality Date  . COLON SURGERY    . CYSTOSCOPY WITH LITHOLAPAXY N/A 11/14/2016   Procedure: CYSTOSCOPY WITH LITHOLAPAXY;  Surgeon: Hollice Espy, MD;  Location: [facility] ORS;  Service: Urology;  Laterality: N/A;  . EYE SURGERY Bilateral    Cataract Extraction with IOL  . HERNIA REPAIR  20 years  . MOHS SURGERY    . SMALL INTESTINE SURGERY     Per patient 7 years  . TRANSURETHRAL RESECTION OF PROSTATE N/A 11/14/2016   Procedure:  TRANSURETHRAL RESECTION OF THE PROSTATE (TURP);  Surgeon: Hollice Espy, MD;  Location: [facility] ORS;  Service: Urology;  Laterality: N/A;   Family History  Problem Relation Age of Onset  . Heart disease Father   . Bladder Cancer Neg Hx   . Prostate cancer Neg Hx   . Kidney cancer Neg Hx    Social History   Socioeconomic History  . Marital status: Married    Spouse name: Not on file  . Number of children: 2  . Years  of education: Not on file  . Highest education level: Bachelor's degree (e.g., BA, AB, BS)  Occupational History  . Occupation: retired  Tobacco Use  . Smoking status: Never Smoker  . Smokeless tobacco: Never Used  Substance and Sexual Activity  . Alcohol use: Yes    Alcohol/week: 0.0 - 3.0 standard drinks  . Drug use: No  . Sexual activity: Not on file  Other Topics Concern  . Not on file  Social History Narrative  . Not on file   Social Determinants of Health   Financial Resource Strain: Low Risk   . Difficulty of Paying Living Expenses: Not hard at all  Food Insecurity: No Food Insecurity  . Worried About Charity fundraiser in the Last Year: Never true  . Ran Out of Food in the Last Year: Never true  Transportation Needs: No Transportation Needs  . Lack of Transportation (Medical): No  . Lack of Transportation (Non-Medical): No  Physical Activity: Inactive  . Days of Exercise per Week: 0 days  . Minutes of Exercise per Session: 0 min  Stress: Stress Concern Present  . Feeling of Stress : To some extent  Social Connections: Somewhat Isolated  . Frequency of Communication with Friends and Family: More than three times a week  . Frequency of Social Gatherings with Friends and Family: More than three times a week  . Attends Religious Services: Never  . Active Member of Clubs or Organizations: No  . Attends Archivist Meetings: Never  . Marital Status: Married    Outpatient Encounter Medications as of 03/18/2020  Medication Sig  . ARIPiprazole (ABILIFY) 2 MG tablet Take 2 mg by mouth 2 (two) times daily.   . carbidopa-levodopa (SINEMET IR) 25-250 MG tablet Take 2 tablets by mouth 3 (three) times daily.   . Carboxymethylcellulose Sodium (THERATEARS OP) Place 1-2 drops into both eyes at bedtime.  . cholecalciferol (VITAMIN D) 1000 units tablet Take 1,000 Units by mouth daily.  Marland Kitchen DOK 100 MG capsule TAKE 1 CAPSULE(100 MG) BY MOUTH TWICE DAILY (Patient taking  differently: 100 mg 2 (two) times daily. Docusate sodium)  . donepezil (ARICEPT) 10 MG tablet Take 10 mg by mouth at bedtime.   . finasteride (PROSCAR) 5 MG tablet Take 1 tablet (5 mg total) by mouth daily.  . midodrine (PROAMATINE) 5 MG tablet Take 1 tablet (5 mg total) by mouth 3 (three) times daily with meals.  . Multiple Vitamin (MULTIVITAMIN) capsule Take 1 capsule by mouth daily.  . Multiple Vitamins-Minerals (OCUVITE PO) Take 1 tablet by mouth daily.   Debby Freiberg Tartrate (NUPLAZID) 34 MG CAPS Take 34 mg by mouth daily.  . sertraline (ZOLOFT) 50 MG tablet TAKE 1 TABLET BY MOUTH EVERY DAY (Patient taking differently: Take 100 mg by mouth daily. )  . albuterol (VENTOLIN HFA) 108 (90 Base) MCG/ACT inhaler Inhale 2 puffs into the lungs every 6 (six) hours as needed for wheezing or shortness of breath. (Patient not taking:  Reported on 01/23/2020)  . econazole nitrate 1 % cream Apply 1 application topically daily. Applied to feet (Patient not taking: Reported on 03/18/2020)  . furosemide (LASIX) 20 MG tablet take 1 tablet by mouth once daily (Patient not taking: Reported on 01/23/2020)  . niacinamide 500 MG tablet   . ondansetron (ZOFRAN ODT) 4 MG disintegrating tablet Take 1 tablet (4 mg total) by mouth every 8 (eight) hours as needed for nausea or vomiting. (Patient not taking: Reported on 01/23/2020)  . Pimavanserin Tartrate (NUPLAZID) 17 MG TABS Take 17 mg by mouth daily.    No facility-administered encounter medications on file as of 03/18/2020.    Activities of Daily Living In your present state of health, do you have any difficulty performing the following activities: 03/18/2020  Hearing? N  Vision? N  Difficulty concentrating or making decisions? Y  Comment Currently on Aricept.  Walking or climbing stairs? Y  Comment Due to Parkinsons.  Dressing or bathing? Y  Comment Has assistance with dressing and bathing.  Doing errands, shopping? Y  Comment Does not drive.  Preparing Food and  eating ? Y  Comment Wife does all the cooking.  Using the Toilet? N  In the past six months, have you accidently leaked urine? Y  Comment Wears depends at all times.  Do you have problems with loss of bowel control? N  Managing your Medications? Y  Comment Wife manages finances.  Managing your Finances? Y  Comment Wife takes care of all finances.  Housekeeping or managing your Housekeeping? Y  Comment Does not clean. Caregiverand wife does the cleaning.  Some recent data might be hidden    Patient Care Team: Mar Daring, PA-C as PCP - General (Family Medicine) Vladimir Crofts, MD as Consulting Physician (Neurology) Leandrew Koyanagi, MD as Referring Physician (Ophthalmology) Poggi, Marshall Cork, MD as Consulting Physician (Surgery) Merril Abbe, MD as Referring Physician (Dermatology) Hollice Espy, MD as Consulting Physician (Urology) Dasher, Rayvon Char, MD as Consulting Physician (Dermatology) Gayland Curry Hubbard Hartshorn (Neurology)   Assessment:   This is a routine wellness examination for [place].  Exercise Activities and Dietary recommendations Current Exercise Habits: Home exercise routine, Type of exercise: walking(& at home PT), Time (Minutes): 60, Frequency (Times/Week): 5, Weekly Exercise (Minutes/Week): 300, Intensity: Mild, Exercise limited by: neurologic condition(s)  Goals    . DIET - INCREASE WATER INTAKE     Recommend increasing water intake to 4-6 glasses a day.     Marland Kitchen LIFESTYLE - DECREASE FALLS RISK     Recommend to remove any items from the home that may cause slips or trips.       Fall Risk: Fall Risk  03/18/2020 03/13/2019 03/08/2018 02/10/2017  Falls in the past year? 1 1 Yes Yes  Number falls in past yr: 1 1 2  or more 2 or more  Injury with Fall? 0 0 No No  Risk for fall due to : Other (Comment) - - Impaired mobility  Risk for fall due to: Comment Due to Parkinsons - - -  Follow up Falls prevention discussed Falls prevention discussed Falls  prevention discussed Falls prevention discussed    FALL RISK PREVENTION PERTAINING TO THE HOME:  Any stairs in or around the home? No  If so, are there any without handrails? N/A  Home free of loose throw rugs in walkways, pet beds, electrical cords, etc? Yes  Adequate lighting in your home to reduce risk of falls? Yes   ASSISTIVE DEVICES UTILIZED TO  PREVENT FALLS:  Life alert? No  Use of a cane, walker or w/c? Yes  Grab bars in the bathroom? Yes  Shower chair or bench in shower? Yes  Elevated toilet seat or a handicapped toilet? No   TIMED UP AND GO:  Was the test performed? No .    Depression Screen PHQ 2/9 Scores 03/18/2020 03/13/2019 03/08/2018 02/10/2017  PHQ - 2 Score 0 2 4 3   PHQ- 9 Score - 11 14 16     Cognitive Function: Unable to complete due to speaking with wife to complete AWV.      6CIT Screen 02/10/2017  What Year? 0 points  What month? 0 points  What time? 0 points  Count back from 20 0 points  Months in reverse 4 points  Repeat phrase 2 points  Total Score 6    Immunization History  Administered Date(s) Administered  . Fluad Quad(high Dose 65+) 06/27/2019  . Influenza, High Dose Seasonal PF 07/09/2018  . Influenza-Unspecified 07/24/2016  . PFIZER SARS-COV-2 Vaccination 12/08/2019, 01/07/2020  . Pneumococcal Conjugate-13 12/16/2016  . Pneumococcal Polysaccharide-23 01/09/2018  . Td 02/17/2020  . Zoster Recombinat (Shingrix) 07/18/2017, 01/29/2018    Qualifies for Shingles Vaccine? Completed series  Tdap: Up to date  Flu Vaccine: Up to date  Pneumococcal Vaccine: Completed series  Screening Tests Health Maintenance  Topic Date Due  . INFLUENZA VACCINE  05/24/2020  . TETANUS/TDAP  02/16/2030  . COVID-19 Vaccine  Completed  . PNA vac Low Risk Adult  Completed   Cancer Screenings:  Colorectal Screening: No longer required.   Lung Cancer Screening: (Low Dose CT Chest recommended if Age 37-80 years, 30 pack-year currently smoking OR have  quit w/in 15years.) does not qualify.   Additional Screening:  Vision Screening: Recommended annual ophthalmology exams for early detection of glaucoma and other disorders of the eye.  Dental Screening: Recommended annual dental exams for proper oral hygiene  Community Resource Referral:  CRR required this visit? No      Plan:  I have personally reviewed and addressed the Medicare Annual Wellness questionnaire and have noted the following in the patient's chart:  A. Medical and social history B. Use of alcohol, tobacco or illicit drugs  C. Current medications and supplements D. Functional ability and status E.  Nutritional status F.  Physical activity G. Advance directives H. List of other physicians I.  Hospitalizations, surgeries, and ER visits in previous 12 months J.  [place] such as hearing and vision if needed, cognitive and depression L. Referrals and appointments   In addition, I have reviewed and discussed with patient certain preventive protocols, quality metrics, and best practice recommendations. A written personalized care plan for preventive services as well as general preventive health recommendations were provided to patient.   [place]ora Score, [place]  [id number] Nurse Health Advisor   Nurse Notes: None.

## 2020-03-18 ENCOUNTER — Ambulatory Visit (INDEPENDENT_AMBULATORY_CARE_PROVIDER_SITE_OTHER): Payer: Medicare Other

## 2020-03-18 ENCOUNTER — Other Ambulatory Visit: Payer: Self-pay

## 2020-03-18 DIAGNOSIS — Z0000 Encounter for general adult medical examination without abnormal findings: Secondary | ICD-10-CM | POA: Diagnosis not present

## 2020-03-18 NOTE — Patient Instructions (Signed)
Luke Durham , Thank you for taking time to come for your Medicare Wellness Visit. I appreciate your ongoing commitment to your health goals. Please review the following plan we discussed and let me know if I can assist you in the future.   Screening recommendations/referrals: Colonoscopy: No longer required.  Recommended yearly ophthalmology/optometry visit for glaucoma screening and checkup Recommended yearly dental visit for hygiene and checkup  Vaccinations: Influenza vaccine: Up to date Pneumococcal vaccine: Completed series Tdap vaccine: Up to date, due 01/2030 Shingles vaccine: Completed series    Advanced directives: Please bring a copy of your POA (Power of Attorney) and/or Living Will to your next appointment.   Conditions/risks identified: Recommend to increase water intake to 6-8 8 oz glasses a day.   Next appointment: 06/25/20 @ 2:00 PM with [name] 65 Years and Older, Male Preventive care refers to lifestyle choices and visits with your health care provider that can promote health and wellness. What does preventive care include?  A yearly physical exam. This is also called an annual well check.  Dental exams once or twice a year.  Routine eye exams. Ask your health care provider how often you should have your eyes checked.  Personal lifestyle choices, including:  Daily care of your teeth and gums.  Regular physical activity.  Eating a healthy diet.  Avoiding tobacco and drug use.  Limiting alcohol use.  Practicing safe sex.  Taking low doses of aspirin every day.  Taking vitamin and mineral supplements as recommended by your health care provider. What happens during an annual well check? The services and screenings done by your health care provider during your annual well check will depend on your age, overall health, lifestyle risk factors, and family history of disease. Counseling  Your health care provider may ask you  questions about your:  Alcohol use.  Tobacco use.  Drug use.  Emotional well-being.  Home and relationship well-being.  Sexual activity.  Eating habits.  History of falls.  Memory and ability to understand (cognition).  Work and work Statistician. Screening  You may have the following tests or measurements:  Height, weight, and BMI.  Blood pressure.  Lipid and cholesterol levels. These may be checked every 5 years, or more frequently if you are over 54 years old.  Skin check.  Lung cancer screening. You may have this screening every year starting at age 67 if you have a 30-pack-year history of smoking and currently smoke or have quit within the past 15 years.  Fecal occult blood test (FOBT) of the stool. You may have this test every year starting at age 74.  Flexible sigmoidoscopy or colonoscopy. You may have a sigmoidoscopy every 5 years or a colonoscopy every 10 years starting at age 65.  Prostate cancer screening. Recommendations will vary depending on your family history and other risks.  Hepatitis C blood test.  Hepatitis B blood test.  Sexually transmitted disease (STD) testing.  Diabetes screening. This is done by checking your blood sugar (glucose) after you have not eaten for a while (fasting). You may have this done every 1-3 years.  Abdominal aortic aneurysm (AAA) screening. You may need this if you are a current or former smoker.  Osteoporosis. You may be screened starting at age 21 if you are at high risk. Talk with your health care provider about your test results, treatment options, and if necessary, the need for more tests. Vaccines  Your health care provider may recommend certain  vaccines, such as:  Influenza vaccine. This is recommended every year.  Tetanus, diphtheria, and acellular pertussis (Tdap, Td) vaccine. You may need a Td booster every 10 years.  Zoster vaccine. You may need this after age 3.  Pneumococcal 13-valent conjugate  (PCV13) vaccine. One dose is recommended after age 76.  Pneumococcal polysaccharide (PPSV23) vaccine. One dose is recommended after age 48. Talk to your health care provider about which screenings and vaccines you need and how often you need them. This information is not intended to replace advice given to you by your health care provider. Make sure you discuss any questions you have with your health care provider. Document Released: 11/06/2015 Document Revised: 06/29/2016 Document Reviewed: 08/11/2015 Elsevier Interactive Patient Education  2017 [place] Prevention in the Home Falls can cause injuries. They can happen to people of all ages. There are many things you can do to make your home safe and to help prevent falls. What can I do on the outside of my home?  Regularly fix the edges of walkways and driveways and fix any cracks.  Remove anything that might make you trip as you walk through a door, such as a raised step or threshold.  Trim any bushes or trees on the path to your home.  Use bright outdoor lighting.  Clear any walking paths of anything that might make someone trip, such as rocks or tools.  Regularly check to see if handrails are loose or broken. Make sure that both sides of any steps have handrails.  Any raised decks and porches should have guardrails on the edges.  Have any leaves, snow, or ice cleared regularly.  Use sand or salt on walking paths during winter.  Clean up any spills in your garage right away. This includes oil or grease spills. What can I do in the bathroom?  Use night lights.  Install grab bars by the toilet and in the tub and shower. Do not use towel bars as grab bars.  Use non-skid mats or decals in the tub or shower.  If you need to sit down in the shower, use a plastic, non-slip stool.  Keep the floor dry. Clean up any water that spills on the floor as soon as it happens.  Remove soap buildup in the tub or shower  regularly.  Attach bath mats securely with double-sided non-slip rug tape.  Do not have throw rugs and other things on the floor that can make you trip. What can I do in the bedroom?  Use night lights.  Make sure that you have a light by your bed that is easy to reach.  Do not use any sheets or blankets that are too big for your bed. They should not hang down onto the floor.  Have a firm chair that has side arms. You can use this for support while you get dressed.  Do not have throw rugs and other things on the floor that can make you trip. What can I do in the kitchen?  Clean up any spills right away.  Avoid walking on wet floors.  Keep items that you use a lot in easy-to-reach places.  If you need to reach something above you, use a strong step stool that has a grab bar.  Keep electrical cords out of the way.  Do not use floor polish or wax that makes floors slippery. If you must use wax, use non-skid floor wax.  Do not have throw rugs and other things  on the floor that can make you trip. What can I do with my stairs?  Do not leave any items on the stairs.  Make sure that there are handrails on both sides of the stairs and use them. Fix handrails that are broken or loose. Make sure that handrails are as long as the stairways.  Check any carpeting to make sure that it is firmly attached to the stairs. Fix any carpet that is loose or worn.  Avoid having throw rugs at the top or bottom of the stairs. If you do have throw rugs, attach them to the floor with carpet tape.  Make sure that you have a light switch at the top of the stairs and the bottom of the stairs. If you do not have them, ask someone to add them for you. What else can I do to help prevent falls?  Wear shoes that:  Do not have high heels.  Have rubber bottoms.  Are comfortable and fit you well.  Are closed at the toe. Do not wear sandals.  If you use a stepladder:  Make sure that it is fully  opened. Do not climb a closed stepladder.  Make sure that both sides of the stepladder are locked into place.  Ask someone to hold it for you, if possible.  Clearly mark and make sure that you can see:  Any grab bars or handrails.  First and last steps.  Where the edge of each step is.  Use tools that help you move around (mobility aids) if they are needed. These include:  Canes.  Walkers.  Scooters.  Crutches.  Turn on the lights when you go into a dark area. Replace any light bulbs as soon as they burn out.  Set up your furniture so you have a clear path. Avoid moving your furniture around.  If any of your floors are uneven, fix them.  If there are any pets around you, be aware of where they are.  Review your medicines with your doctor. Some medicines can make you feel dizzy. This can increase your chance of falling. Ask your doctor what other things that you can do to help prevent falls. This information is not intended to replace advice given to you by your health care provider. Make sure you discuss any questions you have with your health care provider. Document Released: 08/06/2009 Document Revised: 03/17/2016 Document Reviewed: 11/14/2014 Elsevier Interactive Patient Education  2017 [employer].

## 2020-03-23 IMAGING — US US RENAL
1 series · 14 of 25 positions shown · non-contrast
Comparison: 10/25/2016 abdominal CT

CLINICAL DATA: Gross hematuria

EXAM:
RENAL / URINARY TRACT ULTRASOUND COMPLETE

[Series 1: us renal · 14 of 49 slices shown]
[im 1/49]
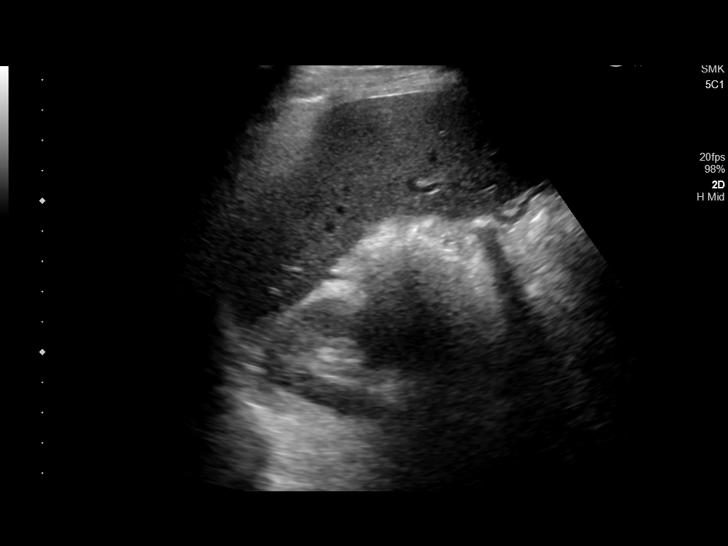
[im 5/49]
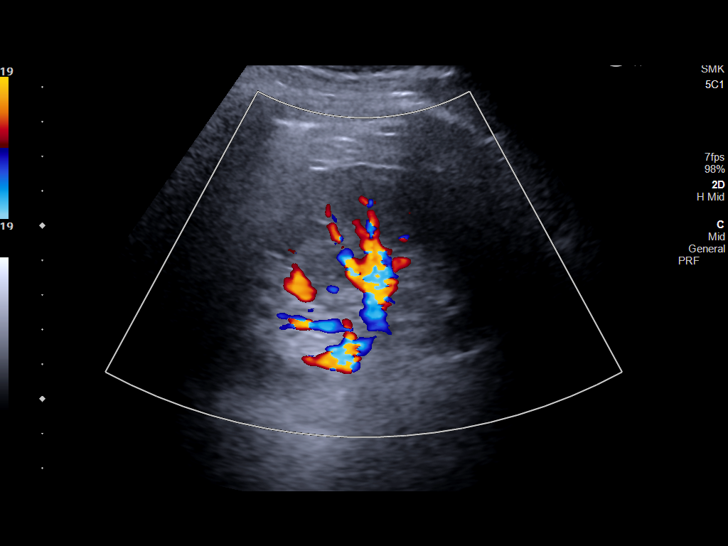
[im 9/49]
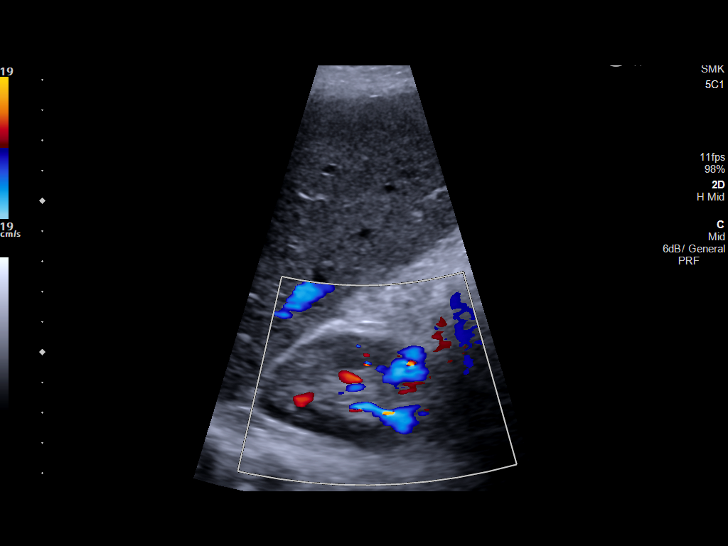
[im 13/49]
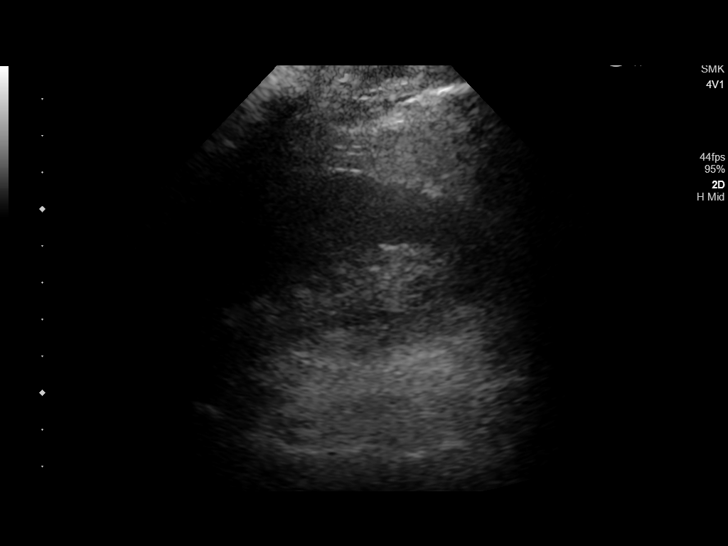
[im 17/49]
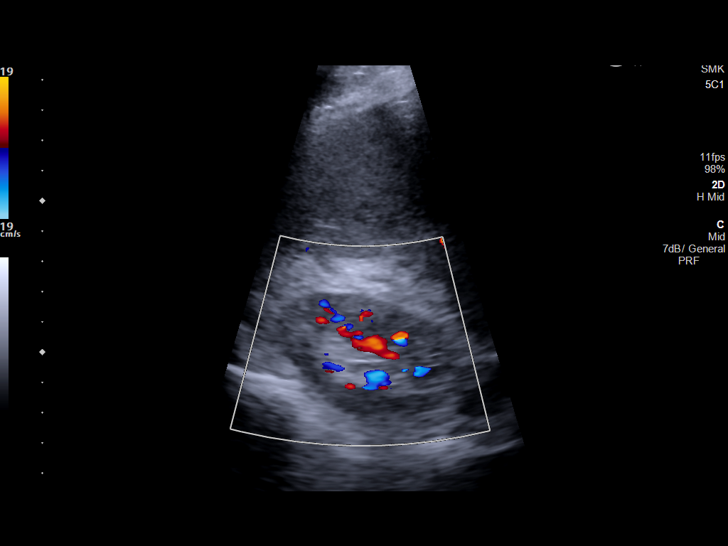
[im 19/49]
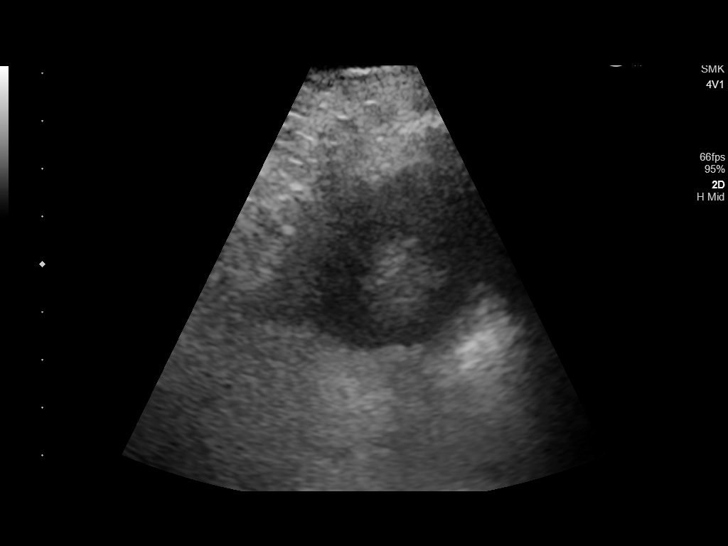
[im 23/49]
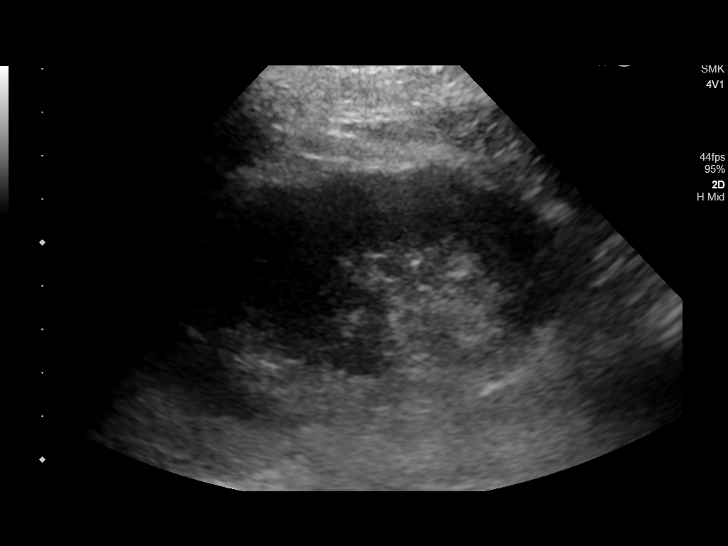
[im 27/49]
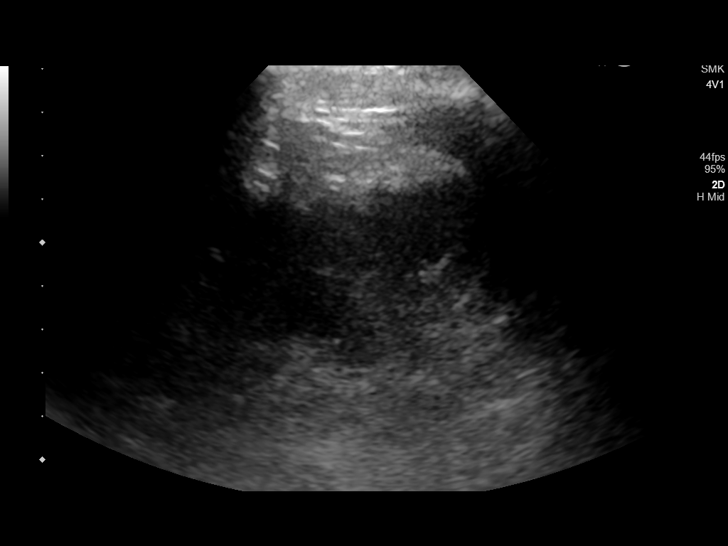
[im 31/49]
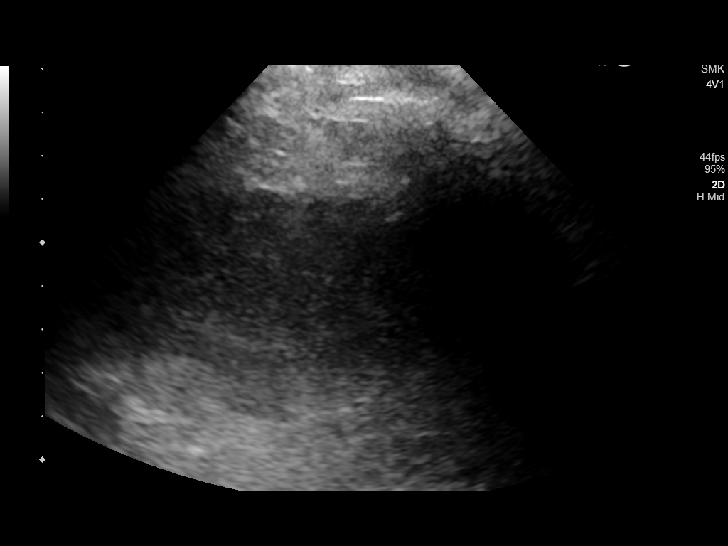
[im 33/49]
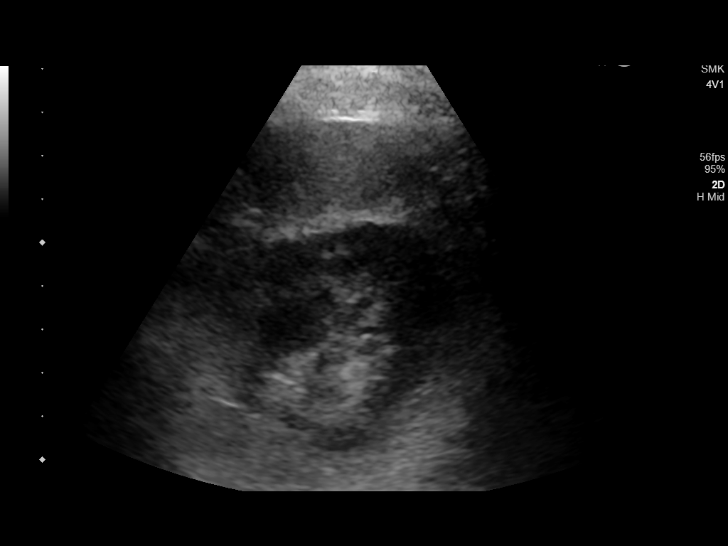
[im 37/49]
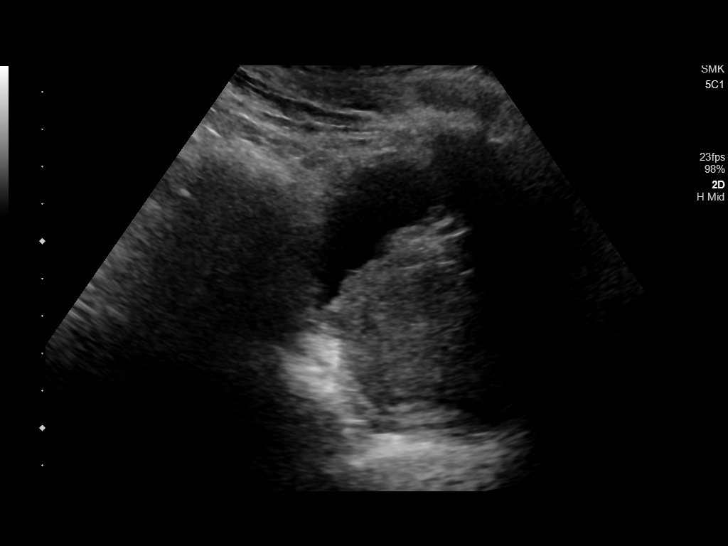
[im 41/49]
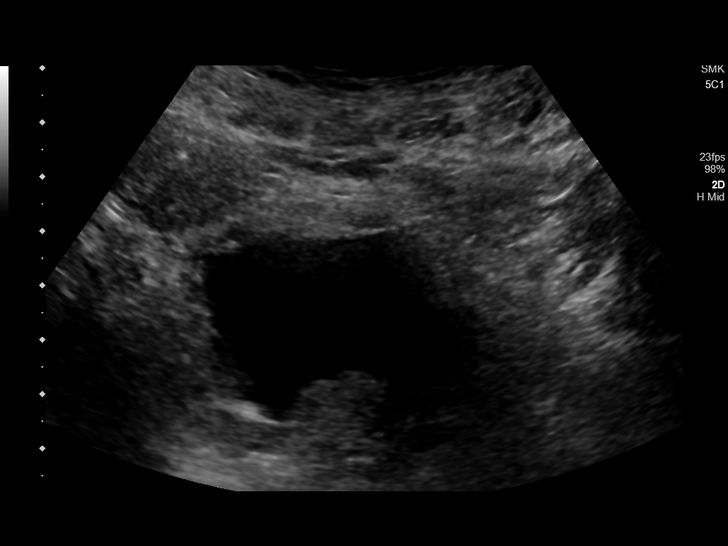
[im 45/49]
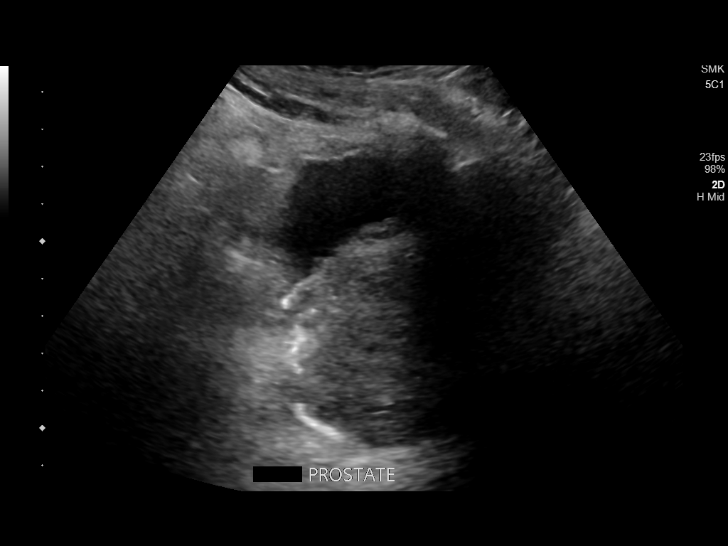
[im 49/49]
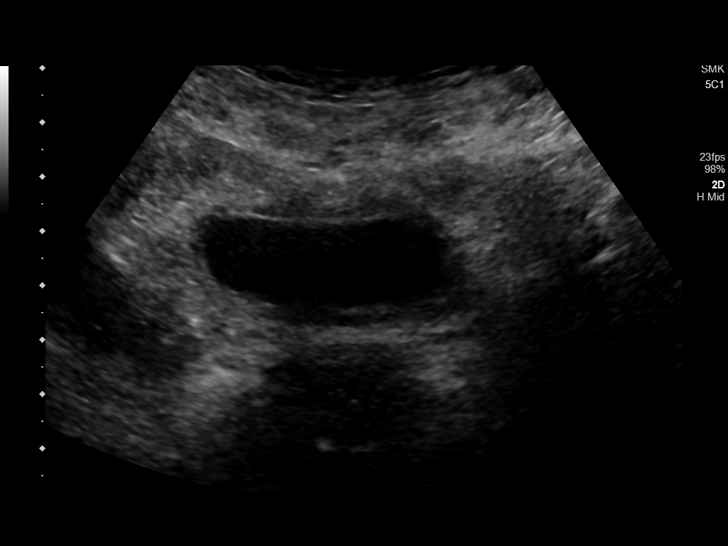

[14 of 25 positions shown; findings below may reference images not displayed]

FINDINGS: Right Kidney:

Length: 10.6 cm. Echogenicity within normal limits. No mass or
hydronephrosis visualized.

Left Kidney:

Length: 9.6 cm. Echogenicity within normal limits. No mass or
hydronephrosis visualized. Interpolar parenchymal bar, also seen on
comparison CT.

Bladder:

Appears normal for degree of bladder distention.

Enlarged prostate measuring 
 70 cc volume.
IMPRESSION: 1. Normal appearance of the bladder and kidneys. Bladder calculi
seen by CT 10/25/2016 are not visualized today.
2. Enlarged prostate.

## 2020-03-30 ENCOUNTER — Other Ambulatory Visit: Payer: Self-pay

## 2020-03-30 ENCOUNTER — Emergency Department: Payer: Medicare Other

## 2020-03-30 ENCOUNTER — Encounter: Payer: Self-pay | Admitting: *Deleted

## 2020-03-30 ENCOUNTER — Inpatient Hospital Stay
Admission: EM | Admit: 2020-03-30 | Discharge: 2020-04-03 | DRG: 696 | Disposition: A | Discharge status: Home Health | Payer: Medicare Other | Attending: Hospitalist | Admitting: Hospitalist

## 2020-03-30 DIAGNOSIS — F419 Anxiety disorder, unspecified: Secondary | ICD-10-CM | POA: Diagnosis present

## 2020-03-30 DIAGNOSIS — F05 Delirium due to known physiological condition: Secondary | ICD-10-CM | POA: Diagnosis not present

## 2020-03-30 DIAGNOSIS — R8271 Bacteriuria: Secondary | ICD-10-CM | POA: Diagnosis not present

## 2020-03-30 DIAGNOSIS — Z9181 History of falling: Secondary | ICD-10-CM | POA: Diagnosis not present

## 2020-03-30 DIAGNOSIS — Z9079 Acquired absence of other genital organ(s): Secondary | ICD-10-CM

## 2020-03-30 DIAGNOSIS — Z85828 Personal history of other malignant neoplasm of skin: Secondary | ICD-10-CM

## 2020-03-30 DIAGNOSIS — R451 Restlessness and agitation: Secondary | ICD-10-CM | POA: Diagnosis not present

## 2020-03-30 DIAGNOSIS — R410 Disorientation, unspecified: Secondary | ICD-10-CM

## 2020-03-30 DIAGNOSIS — Z79899 Other long term (current) drug therapy: Secondary | ICD-10-CM | POA: Diagnosis not present

## 2020-03-30 DIAGNOSIS — E860 Dehydration: Secondary | ICD-10-CM | POA: Diagnosis present

## 2020-03-30 DIAGNOSIS — R4182 Altered mental status, unspecified: Secondary | ICD-10-CM | POA: Diagnosis present

## 2020-03-30 DIAGNOSIS — N179 Acute kidney failure, unspecified: Secondary | ICD-10-CM | POA: Diagnosis present

## 2020-03-30 DIAGNOSIS — Z87442 Personal history of urinary calculi: Secondary | ICD-10-CM | POA: Diagnosis not present

## 2020-03-30 DIAGNOSIS — R296 Repeated falls: Secondary | ICD-10-CM | POA: Diagnosis present

## 2020-03-30 DIAGNOSIS — R2681 Unsteadiness on feet: Secondary | ICD-10-CM | POA: Diagnosis present

## 2020-03-30 DIAGNOSIS — N390 Urinary tract infection, site not specified: Secondary | ICD-10-CM

## 2020-03-30 DIAGNOSIS — Z8572 Personal history of non-Hodgkin lymphomas: Secondary | ICD-10-CM

## 2020-03-30 DIAGNOSIS — F0280 Dementia in other diseases classified elsewhere without behavioral disturbance: Secondary | ICD-10-CM | POA: Diagnosis present

## 2020-03-30 DIAGNOSIS — Z8719 Personal history of other diseases of the digestive system: Secondary | ICD-10-CM | POA: Diagnosis not present

## 2020-03-30 DIAGNOSIS — K219 Gastro-esophageal reflux disease without esophagitis: Secondary | ICD-10-CM | POA: Diagnosis present

## 2020-03-30 DIAGNOSIS — Z20822 Contact with and (suspected) exposure to covid-19: Secondary | ICD-10-CM | POA: Diagnosis present

## 2020-03-30 DIAGNOSIS — R404 Transient alteration of awareness: Secondary | ICD-10-CM | POA: Diagnosis not present

## 2020-03-30 DIAGNOSIS — F32A Depression, unspecified: Secondary | ICD-10-CM

## 2020-03-30 DIAGNOSIS — G20 Parkinson's disease: Secondary | ICD-10-CM | POA: Diagnosis present

## 2020-03-30 LAB — URINALYSIS, COMPLETE (UACMP) WITH MICROSCOPIC
Bilirubin Urine: NEGATIVE
Glucose, UA: NEGATIVE mg/dL
Ketones, ur: 5 mg/dL — AB
Nitrite: NEGATIVE
Protein, ur: 100 mg/dL — AB
Specific Gravity, Urine: 1.023 (ref 1.005–1.030)
pH: 5 (ref 5.0–8.0)

## 2020-03-30 LAB — CBC
HCT: 37.2 % — ABNORMAL LOW (ref 39.0–52.0)
Hemoglobin: 12 g/dL — ABNORMAL LOW (ref 13.0–17.0)
MCH: 30.6 pg (ref 26.0–34.0)
MCHC: 32.3 g/dL (ref 30.0–36.0)
MCV: 94.9 fL (ref 80.0–100.0)
Platelets: 209 10*3/uL (ref 150–400)
RBC: 3.92 MIL/uL — ABNORMAL LOW (ref 4.22–5.81)
RDW: 13.8 % (ref 11.5–15.5)
WBC: 5.5 10*3/uL (ref 4.0–10.5)
nRBC: 0 % (ref 0.0–0.2)

## 2020-03-30 LAB — BASIC METABOLIC PANEL
Anion gap: 9 (ref 5–15)
BUN: 34 mg/dL — ABNORMAL HIGH (ref 8–23)
CO2: 26 mmol/L (ref 22–32)
Calcium: 8.9 mg/dL (ref 8.9–10.3)
Chloride: 100 mmol/L (ref 98–111)
Creatinine, Ser: 1.26 mg/dL — ABNORMAL HIGH (ref 0.61–1.24)
GFR calc Af Amer: 60 mL/min — ABNORMAL LOW (ref >60)
GFR calc non Af Amer: 52 mL/min — ABNORMAL LOW (ref >60)
Glucose, Bld: 97 mg/dL (ref 70–99)
Potassium: 3.9 mmol/L (ref 3.5–5.1)
Sodium: 135 mmol/L (ref 135–145)

## 2020-03-30 LAB — TROPONIN I (HIGH SENSITIVITY): Troponin I (High Sensitivity): 7 ng/L (ref <18)

## 2020-03-30 LAB — SARS CORONAVIRUS 2 BY RT PCR (HOSPITAL ORDER, PERFORMED IN ~~LOC~~ HOSPITAL LAB): SARS Coronavirus 2: NEGATIVE

## 2020-03-30 MED ORDER — SODIUM CHLORIDE 0.9% FLUSH: 3.0000 mL | Freq: Once | INTRAVENOUS | Status: DC

## 2020-03-30 MED ORDER — SODIUM CHLORIDE 0.9 % IV SOLN
1.0000 g | Freq: Once | INTRAVENOUS | Status: AC
  Administered 2020-03-30: 1 g via INTRAVENOUS
  Filled 2020-03-30: qty 10

## 2020-03-30 MED ORDER — BOOST / RESOURCE BREEZE PO LIQD CUSTOM
1.0000 | Freq: Three times a day (TID) | ORAL | Status: DC
  Administered 2020-03-31 – 2020-04-03 (×9): 1 via ORAL

## 2020-03-30 MED ORDER — SODIUM CHLORIDE 0.9 % IV BOLUS
1000.0000 mL | Freq: Once | INTRAVENOUS | Status: AC
  Administered 2020-03-30: 1000 mL via INTRAVENOUS

## 2020-03-30 NOTE — ED Notes (Signed)
Pt at xray

## 2020-03-30 NOTE — ED Provider Notes (Signed)
[facility] Emergency Department Provider Note  ____________________________________________   First MD Initiated Contact with Patient 03/30/20 2056     (approximate)  I have reviewed the triage vital signs and the nursing notes.  History  Chief Complaint Headache and Altered Mental Status    HPI Luke Durham is a 86 y.o. male past medical history as below, including Parkinson's disease, who presents to the emergency department for increased confusion above his baseline, unsteadiness, several falls. Symptoms present x several days, constant since onset. Wife at bedside states that over the last few days the patient has been more confused than his normal, even in the setting of his known dementia.  He has had several falls, they deny any acute injuries related to this.  His neurologist was reportedly concerned for potential UTI. On arrival to the ER his BP is 91/52 w/ associated tachycardia.    Past Medical Hx Past Medical History:  Diagnosis Date  . Anxiety   . Arthritis   . Asthma    childhood asthma  . Cancer ([place]) 7 years ago   lymphoma, [place] (recent)  . Chronic kidney disease   . Colon polyps   . GERD (gastroesophageal reflux disease)   . History of kidney stones   . Parkinson disease [facility])     Problem List Patient Active Problem List   Diagnosis Date Noted  . Lab test positive for detection of COVID-19 virus 10/02/2019  . Hypotension 10/02/2019  . Dementia due to Parkinson's disease without behavioral disturbance ([place]) 07/18/2017  . Neurogenic orthostatic hypotension ([place]) 12/02/2016  . BPH with urinary obstruction 11/14/2016  . Urinary frequency 10/10/2016  . Microscopic hematuria 10/10/2016  . Parkinson's syndrome ([place]) 06/14/2016  . Arthritis 06/14/2016  . Depression 06/14/2016  . Heartburn 06/14/2016  . Urinary incontinence 06/14/2016  . Skin lesion 06/14/2016  . Bilateral edema of lower extremity 06/14/2016    Past Surgical  Hx Past Surgical History:  Procedure Laterality Date  . COLON SURGERY    . CYSTOSCOPY WITH LITHOLAPAXY N/A 11/14/2016   Procedure: CYSTOSCOPY WITH LITHOLAPAXY;  Surgeon: Hollice Espy, MD;  Location: [facility] ORS;  Service: Urology;  Laterality: N/A;  . EYE SURGERY Bilateral    Cataract Extraction with IOL  . HERNIA REPAIR  20 years  . MOHS SURGERY    . SMALL INTESTINE SURGERY     Per patient 7 years  . TRANSURETHRAL RESECTION OF PROSTATE N/A 11/14/2016   Procedure: TRANSURETHRAL RESECTION OF THE PROSTATE (TURP);  Surgeon: Hollice Espy, MD;  Location: [facility] ORS;  Service: Urology;  Laterality: N/A;    Medications Prior to Admission medications   Medication Sig Start Date End Date Taking? Authorizing Provider  albuterol (VENTOLIN HFA) 108 (90 Base) MCG/ACT inhaler Inhale 2 puffs into the lungs every 6 (six) hours as needed for wheezing or shortness of breath. Patient not taking: Reported on 01/23/2020 10/01/19   Blake Divine, MD  ARIPiprazole (ABILIFY) 2 MG tablet Take 2 mg by mouth 2 (two) times daily.  01/28/20   [provider]  carbidopa-levodopa (SINEMET IR) 25-250 MG tablet Take 2 tablets by mouth 3 (three) times daily.     [provider]  Carboxymethylcellulose Sodium (THERATEARS OP) Place 1-2 drops into both eyes at bedtime.    [provider]  cholecalciferol (VITAMIN D) 1000 units tablet Take 1,000 Units by mouth daily.    [provider]  DOK 100 MG capsule TAKE 1 CAPSULE(100 MG) BY MOUTH TWICE DAILY Patient taking differently: 100  mg 2 (two) times daily. Docusate sodium 02/01/19   Mar Daring, PA-C  donepezil (ARICEPT) 10 MG tablet Take 10 mg by mouth at bedtime.  09/14/16   [provider]  econazole nitrate 1 % cream Apply 1 application topically daily. Applied to feet Patient not taking: Reported on 03/18/2020 03/08/18   Mar Daring, PA-C  finasteride (PROSCAR) 5 MG tablet Take 1 tablet (5 mg total) by mouth daily.  07/15/19   Zara Council A, PA-C  furosemide (LASIX) 20 MG tablet take 1 tablet by mouth once daily Patient not taking: Reported on 01/23/2020 09/13/17   Mar Daring, PA-C  midodrine (PROAMATINE) 5 MG tablet Take 1 tablet (5 mg total) by mouth 3 (three) times daily with meals. 06/27/19   Mar Daring, PA-C  Multiple Vitamin (MULTIVITAMIN) capsule Take 1 capsule by mouth daily.    [provider]  Multiple Vitamins-Minerals (OCUVITE PO) Take 1 tablet by mouth daily.     [provider]  niacinamide 500 MG tablet  12/09/16   [provider]  ondansetron (ZOFRAN ODT) 4 MG disintegrating tablet Take 1 tablet (4 mg total) by mouth every 8 (eight) hours as needed for nausea or vomiting. Patient not taking: Reported on 01/23/2020 10/01/19   Blake Divine, MD  Pimavanserin Tartrate (NUPLAZID) 17 MG TABS Take 17 mg by mouth daily.     [provider]  Pimavanserin Tartrate (NUPLAZID) 34 MG CAPS Take 34 mg by mouth daily.    [provider]  sertraline (ZOLOFT) 50 MG tablet TAKE 1 TABLET BY MOUTH EVERY DAY Patient taking differently: Take 100 mg by mouth daily.  12/06/18   Mar Daring, PA-C    Allergies Patient has no known allergies.  Family Hx Family History  Problem Relation Age of Onset  . Heart disease Father   . Bladder Cancer Neg Hx   . Prostate cancer Neg Hx   . Kidney cancer Neg Hx     Social Hx Social History   Tobacco Use  . Smoking status: Never Smoker  . Smokeless tobacco: Never Used  Substance Use Topics  . Alcohol use: Yes    Alcohol/week: 0.0 - 3.0 standard drinks  . Drug use: No     Review of Systems  Constitutional: Negative for fever. Negative for chills.  Positive for confusion. Eyes: Negative for visual changes. ENT: Negative for sore throat. Cardiovascular: Negative for chest pain. Respiratory: Negative for shortness of breath. Gastrointestinal: Negative for nausea. Negative for vomiting.   Genitourinary: Negative for dysuria. Musculoskeletal: Negative for leg swelling. Skin: Negative for rash. Neurological: Negative for headaches.   Physical Exam  Vital Signs: ED Triage Vitals  Enc Vitals Group     BP 03/30/20 1718 (!) 91/52     Pulse Rate 03/30/20 1718 (!) 114     Resp 03/30/20 1718 20     Temp 03/30/20 1718 98 F (36.7 C)     Temp Source 03/30/20 1718 Oral     SpO2 03/30/20 1718 100 %     Weight 03/30/20 1719 165 lb (74.8 kg)     Height 03/30/20 1719 5\' 6"  (1.676 m)     Head Circumference --      Peak Flow --      Pain Score --      Pain Loc --      Pain Edu? --      Excl. in [place]? --     Constitutional: Occasionally answers some questions inaccurately/confused,  but then able to answer some others with better detail. He seems self aware of his confusion. Oriented to self, place, wife at bedside.  Head: Normocephalic. Atraumatic. Eyes: Conjunctivae clear. Sclera anicteric. Pupils equal and symmetric. Nose: No masses or lesions. No congestion or rhinorrhea. Mouth/Throat: Wearing mask.  Neck: No stridor. Trachea midline.  Cardiovascular: Normal rate, regular rhythm. Extremities well perfused. Respiratory: Normal respiratory effort.  Lungs CTAB. Gastrointestinal: Soft. Non-distended. Non-tender.  Genitourinary: Deferred. Musculoskeletal: No lower extremity edema. No deformities. FROM of R shoulder without obvious deformity. Able to cross body and touch his left shoulder. Motor/sensation in tact distally in R radial, ulnar, median distribution.  Neurologic: Normal speech and language. No gross focal or lateralizing neurologic deficits are appreciated.  Occasionally answers some questions inaccurately/confused, but then able to answer some others with better detail. He seems self aware of his confusion. Oriented to self, place, wife at bedside.  Skin: Skin is warm, dry and intact. No rash noted. Psychiatric: Mood and affect are appropriate for  situation.  EKG  Personally reviewed and interpreted by myself.   Date: 03/30/20 Time: 1720 Rate: 63 Rhythm: sinus Axis: normal Intervals: 1st degree AV block RBBB No STEMI    Radiology  Personally reviewed available imaging myself.   CT head - IMPRESSION:  No evidence of acute intracranial abnormality.  Moderate generalized parenchymal atrophy with mild chronic small vessel ischemic changes.  Paranasal sinus mucosal thickening as described.   CXR - IMPRESSION:  1. No active cardiopulmonary disease.  2. Anterior dislocation of the right shoulder.   XR shoulder RIGHT - FINDINGS:  Asymmetry of the right glenohumeral joint noted on the earlier chest  radiographs today, although appears fairly chronic when compared to December. And on scapular Y-view of this exam there is no convincing glenohumeral joint dislocation (image 3). There is severe glenohumeral joint space loss and bulky osteophytosis with subchondral sclerosis. The proximal right humerus and the right scapula appear to remain intact. There is right AC joint degeneration with combined degenerative spurring and chronic joint space widening or subarticular erosion, stable since December. The right clavicle otherwise appears intact. Stable visible right chest. Visible right ribs appear intact.   IMPRESSION:  1. Severe chronic right glenohumeral degeneration but no convincing shoulder dislocation or acute fracture.  2. Moderate to severe right AC joint degeneration appears stable since December.    Procedures  Procedure(s) performed (including critical care):  Procedures   Initial Impression / Assessment and Plan / MDM / ED Course  86 y.o. male who presents to the ED for increased confusion, generalized weakness, more frequent falls x several days  Ddx: electrolyte abnormality, UTI, worsening Parkinson's, occult infection, intracranial  Will plan for labs, imaging.  Work up consistent with UTI and likely  associated metabolic/infectious encephalopathy. Also w/ AKI. BP and HR improved on recheck w/ fluids. Receiving IV antibotics. Initial shoulder abnormality on CXR f/u with dedicated shoulder XR does NOT reveal dislocation. Exam not c/w dislocation either. CT head negative. Will plan to admit for further treatment of UTI and AKI. Patient and wife agreeable.    _______________________________   As part of my medical decision making I have reviewed available labs, radiology tests, reviewed old records/performed chart review, obtained additional history from wife.    Final Clinical Impression(s) / ED Diagnosis  Final diagnoses:  Confusion  AKI (acute kidney injury) ([place])  Urinary tract infection in elderly patient       Note:  This document was prepared using Dragon voice  recognition software and may include unintentional dictation errors.   Lilia Pro., MD 03/30/20 2330

## 2020-03-30 NOTE — ED Triage Notes (Signed)
Pt sent from [facility] with altered mental status and headache.  Sx began yesterday.  Hx parkinson's  Pt alert.

## 2020-03-30 NOTE — H&P (Signed)
History and Physical   [facility] [place] @ [facility] Admission History and Physical [employer], D.O.    Patient Name: Luke Durham MR#: [id number] Date of Birth: 1932/11/26 Date of Admission: 03/30/2020  Referring MD/NP/PA: Dr. Joan Mayans Primary Care Physician: Mar Daring, PA-C  Chief Complaint:  Chief Complaint  Patient presents with  . Headache  . Altered Mental Status    HPI: Luke Durham is a 86 y.o. male with a known history of Parkinson's disease, lymphoma, GERD, anxiety, asthma presents to the emergency department for evaluation of confusion, gait instability and multiple falls.  Patient was in a usual state of health until the past several days when he is wife has noticed increasing confusion on top of his baseline dementia.  He also has unsteady gait which has caused several falls..  He states that he feels better in the ER after treatment.  Patient denies fevers/chills, weakness, dizziness, chest pain, shortness of breath, N/V/C/D, abdominal pain, dysuria/frequency,.    Otherwise there has been no change in status. Patient has been taking medication as prescribed and there has been no recent change in medication or diet.  No recent antibiotics.  There has been no recent illness, hospitalizations, travel or sick contacts.    EMS/ED Course: Patient received Rocephin, normal saline. Medical admission has been requested for further management of altered mental status secondary to urinary tract infection.  Review of Systems:  CONSTITUTIONAL: No fever/chills, fatigue, weakness, weight gain/loss, headache. EYES: No blurry or double vision. ENT: No tinnitus, postnasal drip, redness or soreness of the oropharynx. RESPIRATORY: No cough, dyspnea, wheeze.  No hemoptysis.  CARDIOVASCULAR: No chest pain, palpitations, syncope, orthopnea. No lower extremity edema.  GASTROINTESTINAL: No nausea, vomiting, abdominal pain, diarrhea, constipation.  No hematemesis,  melena or hematochezia. GENITOURINARY: No dysuria, frequency, hematuria. ENDOCRINE: No polyuria or nocturia. No heat or cold intolerance. HEMATOLOGY: No anemia, bruising, bleeding. INTEGUMENTARY: No rashes, ulcers, lesions. MUSCULOSKELETAL: No arthritis, gout, dyspnea. NEUROLOGIC: Positive confusion, gait instability no numbness, tingling, ataxia, seizure-type activity, weakness. PSYCHIATRIC: No anxiety, depression, insomnia.   Past Medical History:  Diagnosis Date  . Anxiety   . Arthritis   . Asthma    childhood asthma  . Cancer ([place]) 7 years ago   lymphoma, [place] (recent)  . Chronic kidney disease   . Colon polyps   . GERD (gastroesophageal reflux disease)   . History of kidney stones   . Parkinson disease [facility])     Past Surgical History:  Procedure Laterality Date  . COLON SURGERY    . CYSTOSCOPY WITH LITHOLAPAXY N/A 11/14/2016   Procedure: CYSTOSCOPY WITH LITHOLAPAXY;  Surgeon: Hollice Espy, MD;  Location: [facility] ORS;  Service: Urology;  Laterality: N/A;  . EYE SURGERY Bilateral    Cataract Extraction with IOL  . HERNIA REPAIR  20 years  . MOHS SURGERY    . SMALL INTESTINE SURGERY     Per patient 7 years  . TRANSURETHRAL RESECTION OF PROSTATE N/A 11/14/2016   Procedure: TRANSURETHRAL RESECTION OF THE PROSTATE (TURP);  Surgeon: Hollice Espy, MD;  Location: [facility] ORS;  Service: Urology;  Laterality: N/A;     reports that he has never smoked. He has never used smokeless tobacco. He reports current alcohol use. He reports that he does not use drugs.  No Known Allergies  Family History  Problem Relation Age of Onset  . Heart disease Father   . Bladder Cancer Neg Hx   . Prostate cancer Neg Hx   . Kidney cancer  Neg Hx     Prior to Admission medications   Medication Sig Start Date End Date Taking? Authorizing Provider  ARIPiprazole (ABILIFY) 2 MG tablet Take 2 mg by mouth 2 (two) times daily.  01/28/20  Yes [provider]  carbidopa-levodopa (SINEMET IR) 25-250  MG tablet Take 2 tablets by mouth 3 (three) times daily.    Yes [provider]  Carboxymethylcellulose Sodium (THERATEARS OP) Place 1-2 drops into both eyes at bedtime.   Yes [provider]  cholecalciferol (VITAMIN D) 1000 units tablet Take 1,000 Units by mouth daily.   Yes [provider]  DOK 100 MG capsule TAKE 1 CAPSULE(100 MG) BY MOUTH TWICE DAILY Patient taking differently: 100 mg 2 (two) times daily. Docusate sodium 02/01/19  Yes Mar Daring, PA-C  donepezil (ARICEPT) 10 MG tablet Take 10 mg by mouth at bedtime.  09/14/16  Yes [provider]  finasteride (PROSCAR) 5 MG tablet Take 1 tablet (5 mg total) by mouth daily. 07/15/19  Yes McGowan, Larene [place] A, PA-C  midodrine (PROAMATINE) 5 MG tablet Take 1 tablet (5 mg total) by mouth 3 (three) times daily with meals. 06/27/19  Yes Mar Daring, PA-C  Multiple Vitamin (MULTIVITAMIN) capsule Take 1 capsule by mouth daily.   Yes [provider]  Multiple Vitamins-Minerals (OCUVITE PO) Take 1 tablet by mouth daily.    Yes [provider]  niacinamide 500 MG tablet  12/09/16  Yes [provider]  Pimavanserin Tartrate (NUPLAZID) 17 MG TABS Take 17 mg by mouth daily.    Yes [provider]  Pimavanserin Tartrate (NUPLAZID) 34 MG CAPS Take 34 mg by mouth daily.   Yes [provider]  sertraline (ZOLOFT) 50 MG tablet TAKE 1 TABLET BY MOUTH EVERY DAY Patient taking differently: Take 100 mg by mouth daily.  12/06/18  Yes Mar Daring, [place]    Physical Exam: Vitals:   03/30/20 1718 03/30/20 1719 03/30/20 2300  BP: (!) 91/52  133/74  Pulse: (!) 114  (!) 53  Resp: 20  20  Temp: 98 F (36.7 C)    TempSrc: Oral    SpO2: 100%  100%  Weight:  74.8 kg   Height:  5\' 6"  (1.676 m)     GENERAL: 86 y.o.-year-old white male patient, well-developed, well-nourished lying in the bed in no acute distress.  Pleasant and cooperative.   HEENT: Head atraumatic,  normocephalic. Pupils equal. Mucus membranes moist. NECK: Supple. No JVD. CHEST: Normal breath sounds bilaterally. No wheezing, rales, rhonchi or crackles. No use of accessory muscles of respiration.  No reproducible chest wall tenderness.  CARDIOVASCULAR: S1, S2 normal. No murmurs, rubs, or gallops. Cap refill <2 seconds. Pulses intact distally.  ABDOMEN: Soft, nondistended, nontender. No rebound, guarding, rigidity. Normoactive bowel sounds present in all four quadrants.  EXTREMITIES: No pedal edema, cyanosis, or clubbing. No calf tenderness or Homan's sign.  NEUROLOGIC: The patient is alert and oriented x 3. Cranial nerves II through XII are grossly intact with no focal sensorimotor deficit. PSYCHIATRIC:  Normal affect, mood, thought content. SKIN: Warm, dry, and intact without obvious rash, lesion, or ulcer.    Labs on Admission:  CBC: Recent Labs  Lab 03/30/20 1721  WBC 5.5  HGB 12.0*  HCT 37.2*  MCV 94.9  PLT 921   Basic Metabolic Panel: Recent Labs  Lab 03/30/20 1721  NA 135  K 3.9  CL 100  CO2 26  GLUCOSE 97  BUN 34*  CREATININE 1.26*  CALCIUM  8.9   GFR: Estimated Creatinine Clearance: 38.7 mL/min (A) (by C-G formula based on SCr of 1.26 mg/dL (H)). Liver Function Tests: No results for input(s): AST, ALT, ALKPHOS, BILITOT, PROT, ALBUMIN in the last 168 hours. No results for input(s): LIPASE, AMYLASE in the last 168 hours. No results for input(s): AMMONIA in the last 168 hours. Coagulation Profile: No results for input(s): INR, PROTIME in the last 168 hours. Cardiac Enzymes: No results for input(s): CKTOTAL, CKMB, CKMBINDEX, TROPONINI in the last 168 hours. BNP (last 3 results) No results for input(s): PROBNP in the last 8760 hours. HbA1C: No results for input(s): HGBA1C in the last 72 hours. CBG: No results for input(s): GLUCAP in the last 168 hours. Lipid Profile: No results for input(s): CHOL, HDL, LDLCALC, TRIG, CHOLHDL, LDLDIRECT in the last 72  hours. Thyroid Function Tests: No results for input(s): TSH, T4TOTAL, FREET4, T3FREE, THYROIDAB in the last 72 hours. Anemia Panel: No results for input(s): VITAMINB12, FOLATE, FERRITIN, TIBC, IRON, RETICCTPCT in the last 72 hours. Urine analysis:    Component Value Date/Time   COLORURINE AMBER (A) 03/30/2020 1727   APPEARANCEUR HAZY (A) 03/30/2020 1727   APPEARANCEUR Clear 10/02/2018 1039   LABSPEC 1.023 03/30/2020 1727   PHURINE 5.0 03/30/2020 1727   GLUCOSEU NEGATIVE 03/30/2020 1727   HGBUR SMALL (A) 03/30/2020 1727   BILIRUBINUR NEGATIVE 03/30/2020 1727   BILIRUBINUR Negative 10/02/2018 1039   KETONESUR 5 (A) 03/30/2020 1727   PROTEINUR 100 (A) 03/30/2020 1727   UROBILINOGEN 0.2 10/05/2016 1640   NITRITE NEGATIVE 03/30/2020 1727   LEUKOCYTESUR SMALL (A) 03/30/2020 1727   Sepsis Labs: @LABRCNTIP (procalcitonin:4,lacticidven:4) )No results found for this or any previous visit (from the past 240 hour(s)).   Radiological Exams on Admission: DG Chest 2 View  Result Date: 03/30/2020 CLINICAL DATA:  Altered mental status. EXAM: CHEST - 2 VIEW COMPARISON:  October 01, 2019 FINDINGS: The lungs are hyperinflated. There is no evidence of acute infiltrate, pleural effusion or pneumothorax. The heart size and mediastinal contours are within normal limits. Anterior dislocation of the right shoulder is seen. IMPRESSION: 1. No active cardiopulmonary disease. 2. Anterior dislocation of the right shoulder. Electronically Signed   By: Virgina Norfolk M.D.   On: 03/30/2020 18:28   DG Shoulder Right  Result Date: 03/30/2020 CLINICAL DATA:  86 year old male with right shoulder pain and history of falls. EXAM: RIGHT SHOULDER - 2+ VIEW COMPARISON:  Chest radiographs earlier today, 10/01/2019. FINDINGS: Asymmetry of the right glenohumeral joint noted on the earlier chest radiographs today, although appears fairly chronic when compared to December. And on scapular Y-view of this exam there is no  convincing glenohumeral joint dislocation (image 3). There is severe glenohumeral joint space loss and bulky osteophytosis with subchondral sclerosis. The proximal right humerus and the right scapula appear to remain intact. There is right AC joint degeneration with combined degenerative spurring and chronic joint space widening or subarticular erosion, stable since December. The right clavicle otherwise appears intact. Stable visible right chest.  Visible right ribs appear intact. IMPRESSION: 1. Severe chronic right glenohumeral degeneration but no convincing shoulder dislocation or acute fracture. 2. Moderate to severe right AC joint degeneration appears stable since December. Electronically Signed   By: Genevie Ann M.D.   On: 03/30/2020 21:46   CT Head Wo Contrast  Result Date: 03/30/2020 CLINICAL DATA:  Headache, Parkinson's; headache, acute, normal neuro exam. EXAM: CT HEAD WITHOUT CONTRAST TECHNIQUE: Contiguous axial images were obtained from the base of the skull through the vertex  without intravenous contrast. COMPARISON:  No pertinent prior studies available for comparison. FINDINGS: Brain: Moderate generalized parenchymal atrophy. Mild ill-defined hypoattenuation within the cerebral white matter is nonspecific, but consistent with chronic small vessel ischemic disease. There is no acute intracranial hemorrhage. No demarcated cortical infarct. No extra-axial fluid collection. No evidence of intracranial mass. No midline shift. Vascular: No hyperdense vessel.  Atherosclerotic calcifications. Skull: Normal. Negative for fracture or focal lesion. Sinuses/Orbits: Visualized orbits show no acute finding. Mild ethmoid and left frontal sinus mucosal thickening. Additionally, there is mucosal thickening within the partially imaged maxillary sinuses. No significant mastoid effusion. IMPRESSION: No evidence of acute intracranial abnormality. Moderate generalized parenchymal atrophy with mild chronic small vessel  ischemic changes. Paranasal sinus mucosal thickening as described. Electronically Signed   By: Kellie Simmering DO   On: 03/30/2020 18:29    EKG: Normal sinus rhythm at 63 bpm with normal axis, first-degree AV block and nonspecific ST-T wave changes.   Assessment/Plan  This is a 86 y.o. male with a history of Parkinson's disease, lymphoma, GERD, anxiety, asthma now being admitted with:  #.  Altered mental status secondary to urinary tract infection -Admit inpatient -IV Rocephin -Follow-up urine culture -Neurochecks -IV fluids  #. Acute kidney injury  - IV fluids and repeat BMP in AM.  - Avoid nephrotoxic medications - Bladder scan and place foley catheter if evidence of urinary retention - Check renal US  #.  History of Parkinson's disease -Reconcile and resume home medications  Admission status: Patient IV Fluids: Normal saline Diet/Nutrition: Regular Consults called: None DVT Px: Lovenox, SCDs and early ambulation. Code Status: Full Code  Disposition Plan: To home in 1-days  All the records are reviewed and case discussed with ED provider. Management plans discussed with the patient and/or family who express understanding and agree with plan of care.  Luke Durham D.O. on 03/30/2020 at 11:30 PM CC: Primary care physician; Mar Daring, PA-C   03/30/2020, 11:30 PM

## 2020-03-30 NOTE — ED Triage Notes (Signed)
First Nurse Note:  Arrives from [facility] for ED evaluation of AMS, hypotension.  Patient has history of Parkinsons, patient neurologist concerned patient has UTI.

## 2020-03-31 ENCOUNTER — Encounter: Payer: Self-pay | Admitting: Family Medicine

## 2020-03-31 LAB — CBC
HCT: 36.5 % — ABNORMAL LOW (ref 39.0–52.0)
Hemoglobin: 12 g/dL — ABNORMAL LOW (ref 13.0–17.0)
MCH: 31.2 pg (ref 26.0–34.0)
MCHC: 32.9 g/dL (ref 30.0–36.0)
MCV: 94.8 fL (ref 80.0–100.0)
Platelets: 191 10*3/uL (ref 150–400)
RBC: 3.85 MIL/uL — ABNORMAL LOW (ref 4.22–5.81)
RDW: 13.7 % (ref 11.5–15.5)
WBC: 4.1 10*3/uL (ref 4.0–10.5)
nRBC: 0 % (ref 0.0–0.2)

## 2020-03-31 LAB — BASIC METABOLIC PANEL
Anion gap: 7 (ref 5–15)
BUN: 24 mg/dL — ABNORMAL HIGH (ref 8–23)
CO2: 29 mmol/L (ref 22–32)
Calcium: 8.6 mg/dL — ABNORMAL LOW (ref 8.9–10.3)
Chloride: 104 mmol/L (ref 98–111)
Creatinine, Ser: 0.85 mg/dL (ref 0.61–1.24)
GFR calc Af Amer: >60 mL/min (ref >60)
GFR calc non Af Amer: >60 mL/min (ref >60)
Glucose, Bld: 95 mg/dL (ref 70–99)
Potassium: 3.7 mmol/L (ref 3.5–5.1)
Sodium: 140 mmol/L (ref 135–145)

## 2020-03-31 LAB — PHOSPHORUS: Phosphorus: 2.6 mg/dL (ref 2.5–4.6)

## 2020-03-31 LAB — MAGNESIUM: Magnesium: 2.1 mg/dL (ref 1.7–2.4)

## 2020-03-31 MED ORDER — ACETAMINOPHEN 325 MG PO TABS: 650.0000 mg | ORAL_TABLET | Freq: Four times a day (QID) | ORAL | Status: DC | PRN

## 2020-03-31 MED ORDER — ARIPIPRAZOLE 2 MG PO TABS
2.0000 mg | ORAL_TABLET | Freq: Two times a day (BID) | ORAL | Status: DC
  Administered 2020-03-31: 2 mg via ORAL
  Filled 2020-03-31 (×4): qty 1

## 2020-03-31 MED ORDER — SODIUM CHLORIDE 0.9 % IV SOLN: 1.0000 g | INTRAVENOUS | Status: DC

## 2020-03-31 MED ORDER — SERTRALINE HCL 50 MG PO TABS
100.0000 mg | ORAL_TABLET | Freq: Every day | ORAL | Status: DC
  Administered 2020-03-31 – 2020-04-03 (×4): 100 mg via ORAL
  Filled 2020-03-31 (×4): qty 2

## 2020-03-31 MED ORDER — ONDANSETRON HCL 4 MG/2ML IJ SOLN: 4.0000 mg | Freq: Four times a day (QID) | INTRAMUSCULAR | Status: DC | PRN

## 2020-03-31 MED ORDER — ENOXAPARIN SODIUM 40 MG/0.4ML ~~LOC~~ SOLN
40.0000 mg | SUBCUTANEOUS | Status: DC
  Administered 2020-03-31 – 2020-04-03 (×4): 40 mg via SUBCUTANEOUS
  Filled 2020-03-31 (×4): qty 0.4

## 2020-03-31 MED ORDER — BISACODYL 5 MG PO TBEC: 5.0000 mg | DELAYED_RELEASE_TABLET | Freq: Every day | ORAL | Status: DC | PRN

## 2020-03-31 MED ORDER — SODIUM CHLORIDE 0.9 % IV SOLN
1.0000 g | INTRAVENOUS | Status: DC
  Administered 2020-03-31 – 2020-04-01 (×2): 1 g via INTRAVENOUS
  Filled 2020-03-31 (×2): qty 10
  Filled 2020-03-31: qty 1

## 2020-03-31 MED ORDER — ONDANSETRON HCL 4 MG PO TABS: 4.0000 mg | ORAL_TABLET | Freq: Four times a day (QID) | ORAL | Status: DC | PRN

## 2020-03-31 MED ORDER — PIMAVANSERIN TARTRATE 17 MG PO TABS: 34.0000 mg | ORAL_TABLET | Freq: Every day | ORAL | Status: DC

## 2020-03-31 MED ORDER — MIDODRINE HCL 5 MG PO TABS
5.0000 mg | ORAL_TABLET | Freq: Three times a day (TID) | ORAL | Status: DC
  Administered 2020-04-01 – 2020-04-03 (×7): 5 mg via ORAL
  Filled 2020-03-31 (×7): qty 1

## 2020-03-31 MED ORDER — PIMAVANSERIN TARTRATE 17 MG PO TABS: 17.0000 mg | ORAL_TABLET | Freq: Every day | ORAL | Status: DC

## 2020-03-31 MED ORDER — SODIUM CHLORIDE 0.9 % IV SOLN: INTRAVENOUS | Status: DC

## 2020-03-31 MED ORDER — SENNOSIDES-DOCUSATE SODIUM 8.6-50 MG PO TABS
1.0000 | ORAL_TABLET | Freq: Every evening | ORAL | Status: DC | PRN
  Administered 2020-03-31: 1 via ORAL
  Filled 2020-03-31: qty 1

## 2020-03-31 MED ORDER — HYDROXYZINE HCL 25 MG PO TABS
25.0000 mg | ORAL_TABLET | Freq: Three times a day (TID) | ORAL | Status: DC | PRN
  Administered 2020-03-31 – 2020-04-01 (×3): 25 mg via ORAL
  Filled 2020-03-31 (×4): qty 1

## 2020-03-31 MED ORDER — DOCUSATE SODIUM 100 MG PO CAPS
100.0000 mg | ORAL_CAPSULE | Freq: Two times a day (BID) | ORAL | Status: DC
  Administered 2020-03-31 – 2020-04-03 (×6): 100 mg via ORAL
  Filled 2020-03-31 (×7): qty 1

## 2020-03-31 MED ORDER — PIMAVANSERIN TARTRATE 34 MG PO CAPS: 34.0000 mg | ORAL_CAPSULE | Freq: Every day | ORAL | Status: DC

## 2020-03-31 MED ORDER — PIMAVANSERIN TARTRATE 34 MG PO CAPS
34.0000 mg | ORAL_CAPSULE | Freq: Every day | ORAL | Status: DC
  Administered 2020-04-01 – 2020-04-02 (×2): 34 mg via ORAL
  Filled 2020-03-31 (×2): qty 1

## 2020-03-31 MED ORDER — CARBIDOPA-LEVODOPA 25-250 MG PO TABS
2.0000 | ORAL_TABLET | Freq: Three times a day (TID) | ORAL | Status: DC
  Administered 2020-03-31 – 2020-04-03 (×9): 2 via ORAL
  Filled 2020-03-31 (×11): qty 2

## 2020-03-31 MED ORDER — DONEPEZIL HCL 5 MG PO TABS
10.0000 mg | ORAL_TABLET | Freq: Every day | ORAL | Status: DC
  Administered 2020-03-31 – 2020-04-02 (×3): 10 mg via ORAL
  Filled 2020-03-31 (×4): qty 2

## 2020-03-31 MED ORDER — FINASTERIDE 5 MG PO TABS
5.0000 mg | ORAL_TABLET | Freq: Every day | ORAL | Status: DC
  Administered 2020-03-31 – 2020-04-03 (×4): 5 mg via ORAL
  Filled 2020-03-31 (×4): qty 1

## 2020-03-31 MED ORDER — ACETAMINOPHEN 650 MG RE SUPP: 650.0000 mg | Freq: Four times a day (QID) | RECTAL | Status: DC | PRN

## 2020-03-31 NOTE — Progress Notes (Signed)
PROGRESS NOTE    Luke Durham  SEG:[id number] DOB: [birth year] DOA: 03/30/2020 PCP: Mar Daring, PA-C    Assessment & Plan:   Active Problems:   Altered mental status    Luke Durham is a 86 y.o. Caucasian male with a known history of Parkinson's disease, lymphoma, GERD, anxiety, asthma presents to the emergency department for evaluation of confusion, gait instability and multiple falls.  Patient was in a usual state of health until the past several days when he is wife has noticed increasing confusion on top of his baseline dementia.  He also has unsteady gait which has caused several falls.   # Altered mental status  # Hx of dementia and Parkinson's --Family reported worsening confusion, agitation and gait.  Workup largely neg except for mildly abnormal UA and dehydration. --Started on Rocephin and MIVF on presentation PLAN: --Treat possible UTI and dehydration --Monitor mental status --continue home Sinemet, NUPLAZID, donepezil --Hold abilify --Atarax PRN for acute agitation --consider neuro consult tomorrow if mental status does not improve  #. Acute kidney injury  --continue MIVF   DVT prophylaxis: Lovenox SQ Code Status: Full code  Family Communication: wife and daughter updated today Status is: inpatient Dispo:   The patient is from: home Anticipated d/c is to: home Anticipated d/c date is: 2-3 days Patient currently is not medically stable to d/c due to: AMS unresolved, etiology unclear, pt currently not safe to discharge home.   Subjective and Interval History:  Pt was not able to answer ROS questions.  Wife reported pt being more confused and agitated.  No fever, N/V/D.   Objective: Vitals:   03/31/20 0815 03/31/20 0816 03/31/20 1837 03/31/20 1904  BP:  (!) 159/88 (!) 171/92 (!) 165/98  Pulse:  74 76 75  Resp:  (!) 22 20   Temp: 99 F (37.2 C)  97.8 F (36.6 C) 98.8 F (37.1 C)  TempSrc: Axillary  Oral Axillary  SpO2:  99% 96% 100%   Weight:      Height:        Intake/Output Summary (Last 24 hours) at 03/31/2020 2213 Last data filed at 03/31/2020 1453 Gross per 24 hour  Intake 559.25 ml  Output 1500 ml  Net -940.75 ml   Filed Weights   03/30/20 1719  Weight: 74.8 kg    Examination:   Constitutional: NAD, not oriented, kept eyes closed CV: RRR no M,R,G. Distal pulses +2.  No cyanosis.   RESP: CTA B/L, normal respiratory effort  GI: +BS, NTND Extremities: No effusions, edema, or tenderness in BLE MSK: Moving all extremities  SKIN: warm, dry and intact Neuro: II - XII grossly intact.  Sensation intact   Data Reviewed: I have personally reviewed following labs and imaging studies  CBC: Recent Labs  Lab 03/30/20 1721 03/31/20 0441  WBC 5.5 4.1  HGB 12.0* 12.0*  HCT 37.2* 36.5*  MCV 94.9 94.8  PLT 209 737   Basic Metabolic Panel: Recent Labs  Lab 03/30/20 1721 03/31/20 0441  NA 135 140  K 3.9 3.7  CL 100 104  CO2 26 29  GLUCOSE 97 95  BUN 34* 24*  CREATININE 1.26* 0.85  CALCIUM 8.9 8.6*  MG  --  2.1  PHOS  --  2.6   GFR: Estimated Creatinine Clearance: 57.3 mL/min (by C-G formula based on SCr of 0.85 mg/dL). Liver Function Tests: No results for input(s): AST, ALT, ALKPHOS, BILITOT, PROT, ALBUMIN in the last 168 hours. No results for input(s): LIPASE, AMYLASE in the  last 168 hours. No results for input(s): AMMONIA in the last 168 hours. Coagulation Profile: No results for input(s): INR, PROTIME in the last 168 hours. Cardiac Enzymes: No results for input(s): CKTOTAL, CKMB, CKMBINDEX, TROPONINI in the last 168 hours. BNP (last 3 results) No results for input(s): PROBNP in the last 8760 hours. HbA1C: No results for input(s): HGBA1C in the last 72 hours. CBG: No results for input(s): GLUCAP in the last 168 hours. Lipid Profile: No results for input(s): CHOL, HDL, LDLCALC, TRIG, CHOLHDL, LDLDIRECT in the last 72 hours. Thyroid Function Tests: No results for input(s): TSH, T4TOTAL,  FREET4, T3FREE, THYROIDAB in the last 72 hours. Anemia Panel: No results for input(s): VITAMINB12, FOLATE, FERRITIN, TIBC, IRON, RETICCTPCT in the last 72 hours. Sepsis Labs: No results for input(s): PROCALCITON, LATICACIDVEN in the last 168 hours.  Recent Results (from the past 240 hour(s))  SARS Coronavirus 2 by RT PCR (hospital order, performed in [facility] hospital lab) Nasopharyngeal Nasopharyngeal Swab     Status: None   Collection Time: 03/30/20 10:26 PM   Specimen: Nasopharyngeal Swab  Result Value Ref Range Status   SARS Coronavirus 2 NEGATIVE NEGATIVE Final    Comment: (NOTE) SARS-CoV-2 target nucleic acids are NOT DETECTED. The SARS-CoV-2 RNA is generally detectable in upper and lower respiratory specimens during the acute phase of infection. The lowest concentration of SARS-CoV-2 viral copies this assay can detect is 250 copies / mL. A negative result does not preclude SARS-CoV-2 infection and should not be used as the sole basis for treatment or other patient management decisions.  A negative result may occur with improper specimen collection / handling, submission of specimen other than nasopharyngeal swab, presence of viral mutation(s) within the areas targeted by this assay, and inadequate number of viral copies (<250 copies / mL). A negative result must be combined with clinical observations, patient history, and epidemiological information. Fact Sheet for Patients:   StrictlyIdeas.no Fact Sheet for Healthcare Providers: BankingDealers.co.za This test is not yet approved or cleared  by the [place] FDA and has been authorized for detection and/or diagnosis of SARS-CoV-2 by FDA under an Emergency Use Authorization (EUA).  This EUA will remain in effect (meaning this test can be used) for the duration of the COVID-19 declaration under Section 564(b)(1) of the Act, 21 U.S.C. section 360bbb-3(b)(1), unless the  authorization is terminated or revoked sooner. Performed at [facility], [address], [place], [place] [address]       Radiology Studies: DG Chest 2 View  Result Date: 03/30/2020 CLINICAL DATA:  Altered mental status. EXAM: CHEST - 2 VIEW COMPARISON:  October 01, 2019 FINDINGS: The lungs are hyperinflated. There is no evidence of acute infiltrate, pleural effusion or pneumothorax. The heart size and mediastinal contours are within normal limits. Anterior dislocation of the right shoulder is seen. IMPRESSION: 1. No active cardiopulmonary disease. 2. Anterior dislocation of the right shoulder. Electronically Signed   By: Virgina Norfolk M.D.   On: 03/30/2020 18:28   DG Shoulder Right  Result Date: 03/30/2020 CLINICAL DATA:  86 year old male with right shoulder pain and history of falls. EXAM: RIGHT SHOULDER - 2+ VIEW COMPARISON:  Chest radiographs earlier today, 10/01/2019. FINDINGS: Asymmetry of the right glenohumeral joint noted on the earlier chest radiographs today, although appears fairly chronic when compared to December. And on scapular Y-view of this exam there is no convincing glenohumeral joint dislocation (image 3). There is severe glenohumeral joint space loss and bulky osteophytosis with subchondral sclerosis. The  proximal right humerus and the right scapula appear to remain intact. There is right AC joint degeneration with combined degenerative spurring and chronic joint space widening or subarticular erosion, stable since December. The right clavicle otherwise appears intact. Stable visible right chest.  Visible right ribs appear intact. IMPRESSION: 1. Severe chronic right glenohumeral degeneration but no convincing shoulder dislocation or acute fracture. 2. Moderate to severe right AC joint degeneration appears stable since December. Electronically Signed   By: Genevie Ann M.D.   On: 03/30/2020 21:46   CT Head Wo Contrast  Result Date: 03/30/2020 CLINICAL DATA:  Headache,  Parkinson's; headache, acute, normal neuro exam. EXAM: CT HEAD WITHOUT CONTRAST TECHNIQUE: Contiguous axial images were obtained from the base of the skull through the vertex without intravenous contrast. COMPARISON:  No pertinent prior studies available for comparison. FINDINGS: Brain: Moderate generalized parenchymal atrophy. Mild ill-defined hypoattenuation within the cerebral white matter is nonspecific, but consistent with chronic small vessel ischemic disease. There is no acute intracranial hemorrhage. No demarcated cortical infarct. No extra-axial fluid collection. No evidence of intracranial mass. No midline shift. Vascular: No hyperdense vessel.  Atherosclerotic calcifications. Skull: Normal. Negative for fracture or focal lesion. Sinuses/Orbits: Visualized orbits show no acute finding. Mild ethmoid and left frontal sinus mucosal thickening. Additionally, there is mucosal thickening within the partially imaged maxillary sinuses. No significant mastoid effusion. IMPRESSION: No evidence of acute intracranial abnormality. Moderate generalized parenchymal atrophy with mild chronic small vessel ischemic changes. Paranasal sinus mucosal thickening as described. Electronically Signed   By: Kellie Simmering DO   On: 03/30/2020 18:29     Scheduled Meds: . carbidopa-levodopa  2 tablet Oral TID  . docusate sodium  100 mg Oral BID  . donepezil  10 mg Oral QHS  . enoxaparin (LOVENOX) injection  40 mg Subcutaneous Q24H  . feeding supplement  1 Container Oral TID BM  . finasteride  5 mg Oral Daily  . [START ON 04/01/2020] midodrine  5 mg Oral TID WC  . [START ON 04/01/2020] Pimavanserin Tartrate  34 mg Oral Daily  . sertraline  100 mg Oral Daily  . sodium chloride flush  3 mL Intravenous Once   Continuous Infusions: . sodium chloride 75 mL/hr at 03/31/20 1901  . cefTRIAXone (ROCEPHIN)  IV Stopped (03/31/20 2000)     LOS: 1 day     Enzo Bi, MD [facility] If 7PM-7AM, please contact  night-coverage 03/31/2020, 10:13 PM

## 2020-03-31 NOTE — ED Notes (Signed)
Pt is laying in room. Pt is on cardiac and pulse ox monitor. Pt stated he thought he was at [facility] [place] [facility], but states we are in June. Pt states I know who the president is, but I wont say the name. Pt is laying in bed. Cardiac, bp and pulse ox monitor on.

## 2020-03-31 NOTE — ED Notes (Signed)
Pt incontinent of urine. Pt depends changed and pt repositioned for comfort and safety. Warm blanket provided.

## 2020-04-01 ENCOUNTER — Inpatient Hospital Stay: Payer: Medicare Other

## 2020-04-01 DIAGNOSIS — R410 Disorientation, unspecified: Secondary | ICD-10-CM

## 2020-04-01 LAB — CBC
HCT: 36.4 % — ABNORMAL LOW (ref 39.0–52.0)
Hemoglobin: 12.6 g/dL — ABNORMAL LOW (ref 13.0–17.0)
MCH: 31.7 pg (ref 26.0–34.0)
MCHC: 34.6 g/dL (ref 30.0–36.0)
MCV: 91.7 fL (ref 80.0–100.0)
Platelets: 207 10*3/uL (ref 150–400)
RBC: 3.97 MIL/uL — ABNORMAL LOW (ref 4.22–5.81)
RDW: 13.6 % (ref 11.5–15.5)
WBC: 5.3 10*3/uL (ref 4.0–10.5)
nRBC: 0 % (ref 0.0–0.2)

## 2020-04-01 LAB — BASIC METABOLIC PANEL
Anion gap: 6 (ref 5–15)
BUN: 18 mg/dL (ref 8–23)
CO2: 28 mmol/L (ref 22–32)
Calcium: 8.7 mg/dL — ABNORMAL LOW (ref 8.9–10.3)
Chloride: 109 mmol/L (ref 98–111)
Creatinine, Ser: 0.84 mg/dL (ref 0.61–1.24)
GFR calc Af Amer: >60 mL/min (ref >60)
GFR calc non Af Amer: >60 mL/min (ref >60)
Glucose, Bld: 87 mg/dL (ref 70–99)
Potassium: 3.7 mmol/L (ref 3.5–5.1)
Sodium: 143 mmol/L (ref 135–145)

## 2020-04-01 LAB — MAGNESIUM: Magnesium: 2.1 mg/dL (ref 1.7–2.4)

## 2020-04-01 NOTE — Consult Note (Signed)
Reason for Consult:AMS Requesting Physician: Billie Ruddy  CC: AMS, worsening gait  I have been asked by Dr. Billie Ruddy to see this patient in consultation for AMS and difficulty with gait.  HPI: Luke Durham is an 86 y.o. male who due to mental status is unable to provide history therefore all history obtained form the chart.  Patient with a known history of Parkinson's disease, lymphoma, GERD, anxiety, asthma presents to the emergency department for evaluation of confusion, gait instability and multiple falls.  Patient was in a usual state of health until the past several days when his wife noticed increasing confusion on top of his baseline dementia.  He also has unsteady gait which has caused several falls..  He stated that he felt better in the ER after treatment for UTI but does not appear to have returned to baseline.    Past Medical History:  Diagnosis Date  . Anxiety   . Arthritis   . Asthma    childhood asthma  . Cancer ([place]) 7 years ago   lymphoma, [place] (recent)  . Chronic kidney disease   . Colon polyps   . GERD (gastroesophageal reflux disease)   . History of kidney stones   . Parkinson disease [facility])     Past Surgical History:  Procedure Laterality Date  . COLON SURGERY    . CYSTOSCOPY WITH LITHOLAPAXY N/A 11/14/2016   Procedure: CYSTOSCOPY WITH LITHOLAPAXY;  Surgeon: Hollice Espy, MD;  Location: [facility] ORS;  Service: Urology;  Laterality: N/A;  . EYE SURGERY Bilateral    Cataract Extraction with IOL  . HERNIA REPAIR  20 years  . MOHS SURGERY    . SMALL INTESTINE SURGERY     Per patient 7 years  . TRANSURETHRAL RESECTION OF PROSTATE N/A 11/14/2016   Procedure: TRANSURETHRAL RESECTION OF THE PROSTATE (TURP);  Surgeon: Hollice Espy, MD;  Location: [facility] ORS;  Service: Urology;  Laterality: N/A;    Family History  Problem Relation Age of Onset  . Heart disease Father   . Bladder Cancer Neg Hx   . Prostate cancer Neg Hx   . Kidney cancer Neg Hx     Social History:  reports  that he has never smoked. He has never used smokeless tobacco. He reports current alcohol use. He reports that he does not use drugs.  No Known Allergies  Medications:  I have reviewed the patient's current medications. Prior to Admission:  Medications Prior to Admission  Medication Sig Dispense Refill Last Dose  . ARIPiprazole (ABILIFY) 2 MG tablet Take 2 mg by mouth 2 (two) times daily.    03/30/2020 at Unknown time  . carbidopa-levodopa (SINEMET IR) 25-250 MG tablet Take 2 tablets by mouth 3 (three) times daily.    03/30/2020 at Unknown time  . Carboxymethylcellulose Sodium (THERATEARS OP) Place 1-2 drops into both eyes at bedtime.   03/29/2020 at Unknown time  . cholecalciferol (VITAMIN D) 1000 units tablet Take 1,000 Units by mouth daily.   03/30/2020 at Unknown time  . DOK 100 MG capsule TAKE 1 CAPSULE(100 MG) BY MOUTH TWICE DAILY (Patient taking differently: 100 mg 2 (two) times daily. Docusate sodium) 60 capsule 11 03/30/2020 at Unknown time  . donepezil (ARICEPT) 10 MG tablet Take 10 mg by mouth at bedtime.    03/29/2020 at Unknown time  . finasteride (PROSCAR) 5 MG tablet Take 1 tablet (5 mg total) by mouth daily. 30 tablet 11 03/30/2020 at Unknown time  . midodrine (PROAMATINE) 5 MG tablet Take 1 tablet (5 mg  total) by mouth 3 (three) times daily with meals. 270 tablet 1 03/30/2020 at Unknown time  . Multiple Vitamin (MULTIVITAMIN) capsule Take 1 capsule by mouth daily.   03/30/2020 at Unknown time  . Multiple Vitamins-Minerals (OCUVITE PO) Take 1 tablet by mouth daily.    03/30/2020 at Unknown time  . niacinamide 500 MG tablet   0 03/30/2020 at Unknown time  . Pimavanserin Tartrate (NUPLAZID) 17 MG TABS Take 17 mg by mouth daily.    03/30/2020 at Unknown time  . Pimavanserin Tartrate (NUPLAZID) 34 MG CAPS Take 34 mg by mouth daily.   03/30/2020 at Unknown time  . sertraline (ZOLOFT) 50 MG tablet TAKE 1 TABLET BY MOUTH EVERY DAY (Patient taking differently: Take 100 mg by mouth daily. ) 90 tablet 1 03/30/2020  at Unknown time   Scheduled: . carbidopa-levodopa  2 tablet Oral TID  . docusate sodium  100 mg Oral BID  . donepezil  10 mg Oral QHS  . enoxaparin (LOVENOX) injection  40 mg Subcutaneous Q24H  . feeding supplement  1 Container Oral TID BM  . finasteride  5 mg Oral Daily  . midodrine  5 mg Oral TID WC  . Pimavanserin Tartrate  34 mg Oral Daily  . sertraline  100 mg Oral Daily  . sodium chloride flush  3 mL Intravenous Once    ROS: Unable to provide due to mental status  Physical Examination: Blood pressure 131/86, pulse 62, temperature 98 F (36.7 C), temperature source Oral, resp. rate 15, height 5\' 6"  (1.676 m), weight 74.8 kg, SpO2 99 %.  HEENT-  Normocephalic, no lesions, without obvious abnormality.  Normal external eye and conjunctiva.  Normal TM's bilaterally.  Normal auditory canals and external ears. Normal external nose, mucus membranes and septum.  Normal pharynx. Cardiovascular- S1, S2 normal, pulses palpable throughout   Lungs- chest clear, no wheezing, rales, normal symmetric air entry, Heart exam - S1, S2 normal, no murmur, no gallop, rate regular Abdomen- soft, non-tender; bowel sounds normal; no masses,  no organomegaly Extremities- no edema Lymph-no adenopathy palpable Musculoskeletal-no joint tenderness, deformity or swelling Skin-warm and dry, no hyperpigmentation, vitiligo, or suspicious lesions  Neurological Examination   Mental Status: Lethargic.  Difficult to arouse.  Follows some simple commands.  Speech dysarthric and  Cranial Nerves: II: Blinks to bilateral confrontation III,IV, VI: EOMs grossly intact V,VII: intact corneals VIII: hearing normal bilaterally IX,X: gag reflex present XI: bilateral shoulder shrug XII: midline tongue extension Motor: Moves all extremities.  Increased tone Sensory: Reacts to noxious stimuli bilaterally Deep Tendon Reflexes: Symmetric throughout Plantars: Withdrawal noted Cerebellar: Unable to perform due to  mental status Gait: unable to perform due to mental status    Laboratory Studies:   Basic Metabolic Panel: Recent Labs  Lab 03/30/20 1721 03/31/20 0441 04/01/20 0415  NA 135 140 143  K 3.9 3.7 3.7  CL 100 104 109  CO2 26 29 28   GLUCOSE 97 95 87  BUN 34* 24* 18  CREATININE 1.26* 0.85 0.84  CALCIUM 8.9 8.6* 8.7*  MG  --  2.1 2.1  PHOS  --  2.6  --     Liver Function Tests: No results for input(s): AST, ALT, ALKPHOS, BILITOT, PROT, ALBUMIN in the last 168 hours. No results for input(s): LIPASE, AMYLASE in the last 168 hours. No results for input(s): AMMONIA in the last 168 hours.  CBC: Recent Labs  Lab 03/30/20 1721 03/31/20 0441 04/01/20 0415  WBC 5.5 4.1 5.3  HGB 12.0* 12.0*  12.6*  HCT 37.2* 36.5* 36.4*  MCV 94.9 94.8 91.7  PLT 209 191 207    Cardiac Enzymes: No results for input(s): CKTOTAL, CKMB, CKMBINDEX, TROPONINI in the last 168 hours.  BNP: Invalid input(s): POCBNP  CBG: No results for input(s): GLUCAP in the last 168 hours.  Microbiology: Results for orders placed or performed during the hospital encounter of 03/30/20  Urine culture     Status: Abnormal (Preliminary result)   Collection Time: 03/30/20  5:27 PM   Specimen: Urine, Clean Catch  Result Value Ref Range Status   Specimen Description   Final    URINE, CLEAN CATCH Performed at [facility], [address] [place], [place] [address]    Special Requests   Final    NONE Performed at [facility], [place], [place] [address]    Culture 10,000 COLONIES/mL ENTEROCOCCUS FAECALIS (A)  Final   Report Status PENDING  Incomplete  SARS Coronavirus 2 by RT PCR (hospital order, performed in [place] hospital lab) Nasopharyngeal Nasopharyngeal Swab     Status: None   Collection Time: 03/30/20 10:26 PM   Specimen: Nasopharyngeal Swab  Result Value Ref Range Status   SARS Coronavirus 2 NEGATIVE NEGATIVE Final    Comment: (NOTE) SARS-CoV-2 target nucleic  acids are NOT DETECTED. The SARS-CoV-2 RNA is generally detectable in upper and lower respiratory specimens during the acute phase of infection. The lowest concentration of SARS-CoV-2 viral copies this assay can detect is 250 copies / mL. A negative result does not preclude SARS-CoV-2 infection and should not be used as the sole basis for treatment or other patient management decisions.  A negative result may occur with improper specimen collection / handling, submission of specimen other than nasopharyngeal swab, presence of viral mutation(s) within the areas targeted by this assay, and inadequate number of viral copies (<250 copies / mL). A negative result must be combined with clinical observations, patient history, and epidemiological information. Fact Sheet for Patients:   StrictlyIdeas.no Fact Sheet for Healthcare Providers: BankingDealers.co.za This test is not yet approved or cleared  by the [place] FDA and has been authorized for detection and/or diagnosis of SARS-CoV-2 by FDA under an Emergency Use Authorization (EUA).  This EUA will remain in effect (meaning this test can be used) for the duration of the COVID-19 declaration under Section 564(b)(1) of the Act, 21 U.S.C. section 360bbb-3(b)(1), unless the authorization is terminated or revoked sooner. Performed at [facility], [address] [place], [place] [address]     Coagulation Studies: No results for input(s): LABPROT, INR in the last 72 hours.  Urinalysis:  Recent Labs  Lab 03/30/20 1727  COLORURINE AMBER*  LABSPEC 1.023  PHURINE 5.0  GLUCOSEU NEGATIVE  HGBUR SMALL*  BILIRUBINUR NEGATIVE  KETONESUR 5*  PROTEINUR 100*  NITRITE NEGATIVE  LEUKOCYTESUR SMALL*    Lipid Panel:     Component Value Date/Time   CHOL 179 06/27/2019 1137   TRIG 72 06/27/2019 1137   HDL 48 06/27/2019 1137   CHOLHDL 3.7 06/27/2019 1137   LDLCALC 117 (H)  06/27/2019 1137    HgbA1C:  Lab Results  Component Value Date   HGBA1C 5.3 06/27/2019    Urine Drug Screen:  No results found for: LABOPIA, COCAINSCRNUR, LABBENZ, AMPHETMU, THCU, LABBARB  Alcohol Level: No results for input(s): ETH in the last 168 hours.   Imaging: DG Shoulder Right  Result Date: 03/30/2020 CLINICAL DATA:  86 year old male with right shoulder pain and history  of falls. EXAM: RIGHT SHOULDER - 2+ VIEW COMPARISON:  Chest radiographs earlier today, 10/01/2019. FINDINGS: Asymmetry of the right glenohumeral joint noted on the earlier chest radiographs today, although appears fairly chronic when compared to December. And on scapular Y-view of this exam there is no convincing glenohumeral joint dislocation (image 3). There is severe glenohumeral joint space loss and bulky osteophytosis with subchondral sclerosis. The proximal right humerus and the right scapula appear to remain intact. There is right AC joint degeneration with combined degenerative spurring and chronic joint space widening or subarticular erosion, stable since December. The right clavicle otherwise appears intact. Stable visible right chest.  Visible right ribs appear intact. IMPRESSION: 1. Severe chronic right glenohumeral degeneration but no convincing shoulder dislocation or acute fracture. 2. Moderate to severe right AC joint degeneration appears stable since December. Electronically Signed   By: Genevie Ann M.D.   On: 03/30/2020 21:46     Assessment/Plan: 86 year old male with a history of Parkinson's disease, lymphoma, GERD, anxiety, asthma presenting with confusion, gait instability and multiple falls.  Work up significant for dehydration and UTI.  In a patient with dementia and PD these two diagnoses alone may lead to the patient's presentation.  As they improve cognitive nad functional status may lag behind.  In addition patient is on Abilify which may cause side effects as well.  Last dose was yesterday and may  take more than 24 hours before completely cleared.  Also placed on Atarax since admission that was to be prn but has been administered TID for the most part.  This may be causing sedation.  Head CT personally reviewed and shows no acute changes.    Recommendations: 1. Agree with discontinuation of Abilify 2. Would discontinue Atarax is possible 3. Agree with treatment of UTI and dehydration 4. MRI of the brain without contrast 5. PT/OT  Alexis Goodell, MD Neurology [phone] 04/01/2020, 6:11 PM

## 2020-04-01 NOTE — Progress Notes (Signed)
PT Cancellation Note  Patient Details Name: Luke Durham MRN: [id number] DOB: [birth year]   Cancelled Treatment:    Reason Eval/Treat Not Completed: (Consult received and chart reviewed. Patient sleeping soundly upon arrival to room; unable to awaken/maintain alertness for participation with session.  Will re-attempt at later time/date as medically appropriate and available.)   Kristen H. Owens Shark, PT, DPT, NCS 04/01/20, 4:18 PM [phone]

## 2020-04-01 NOTE — TOC Initial Note (Signed)
Transition of Care [facility]) - Initial/Assessment Note    Patient Details  Name: Luke Durham MRN: [id number] Date of Birth: 11/13/1932  Transition of Care [facility]) CM/SW Contact:    Shelbie Hutching, RN Phone Number: 04/01/2020, 11:09 AM  Clinical Narrative:                 Patient admitted to the hospital for altered mental status and UTI.  Patient is from home with his wife, wife Pincus Badder is at the bedside.  Daughter Magda Paganini is also at the bedside.  Per family patient walks with a walker at home and has been able to go to the bathroom by himself.  Patient does have Parkinson's disease.  Family has hired caregivers for 7 days a week from [place].  Family would like for patient to have home health care at discharge, [facility] was working with patient until about 3 weeks ago.  Helene Kelp with [facility] will accept patient back at discharge for home health PT.   RNCM will cont to follow and assist with discharge planning.   Expected Discharge Plan: [place] Barriers to Discharge: Continued Medical Work up   Patient Goals and CMS Choice Patient states their goals for this hospitalization and ongoing recovery are:: To return home CMS Medicare.gov Compare Post Acute Care list provided to:: Patient Represenative (must comment) Choice offered to / list presented to : Spouse, Adult Children  Expected Discharge Plan and Services Expected Discharge Plan: [place]   Discharge Planning Services: CM Consult Post Acute Care Choice: [place] arrangements for the past 2 months: [place]: PT [place]: [facility] (now [facility]) Date [place]: 04/01/20 Time [place]: 1108 Representative spoke with at [place]: Drue Novel  Prior Living Arrangements/Services Living arrangements for the past 2 months: [place] with:: Spouse Patient language and  need for interpreter reviewed:: Yes Do you feel safe going back to the place where you live?: Yes      Need for Family Participation in Patient Care: Yes (Comment)(Parkinson's, UTI) Care giver support system in place?: Yes (comment)(wife and daughter) Current home services: DME(walker) Criminal Activity/Legal Involvement Pertinent to Current Situation/Hospitalization: No - Comment as needed  Activities of Daily Living Home Assistive Devices/Equipment: Environmental consultant (specify type), Eyeglasses, Raised toilet seat with rails ADL Screening (condition at time of admission) Patient's cognitive ability adequate to safely complete daily activities?: No Is the patient deaf or have difficulty hearing?: No Does the patient have difficulty seeing, even when wearing glasses/contacts?: No Does the patient have difficulty concentrating, remembering, or making decisions?: Yes Patient able to express need for assistance with ADLs?: Yes Does the patient have difficulty dressing or bathing?: Yes Independently performs ADLs?: No Does the patient have difficulty walking or climbing stairs?: Yes Weakness of Legs: Both Weakness of Arms/Hands: Both  Permission Sought/Granted Permission sought to share information with : Case Manager, Other (comment), Family Supports Permission granted to share information with : Yes, Verbal Permission Granted  Share Information with NAME: Pincus Badder and Magda Paganini  Permission granted to share info w AGENCY: [place] granted to share info w Relationship: Wife and daughter     Emotional Assessment Appearance:: Appears stated age Attitude/Demeanor/Rapport: Unable to Assess     Alcohol /  Substance Use: Not Applicable Psych Involvement: No (comment)  Admission diagnosis:  Confusion [R41.0] Altered mental status [R41.82] AKI (acute kidney injury) ([place]) [N17.9] Urinary tract infection in elderly patient [N39.0] Patient Active Problem List   Diagnosis Date Noted   . Altered mental status 03/30/2020  . Lab test positive for detection of COVID-19 virus 10/02/2019  . Hypotension 10/02/2019  . Dementia due to Parkinson's disease without behavioral disturbance ([place]) 07/18/2017  . Neurogenic orthostatic hypotension ([place]) 12/02/2016  . BPH with urinary obstruction 11/14/2016  . Urinary frequency 10/10/2016  . Microscopic hematuria 10/10/2016  . Parkinson's syndrome ([place]) 06/14/2016  . Arthritis 06/14/2016  . Depression 06/14/2016  . Heartburn 06/14/2016  . Urinary incontinence 06/14/2016  . Skin lesion 06/14/2016  . Bilateral edema of lower extremity 06/14/2016   PCP:  Mar Daring, PA-C Pharmacy:   [id number] [address], [place] AT [address] [place] [address] Phone: [phone] Fax: [phone]     Social Determinants of Health (SDOH) Interventions    Readmission Risk Interventions No flowsheet data found.

## 2020-04-01 NOTE — Progress Notes (Signed)
PROGRESS NOTE    Luke Durham  BJY:[id number] DOB: Jun 01, 1933 DOA: 03/30/2020 PCP: Mar Daring, PA-C    Assessment & Plan:   Active Problems:   Altered mental status    Luke Durham is a 86 y.o. Caucasian male with a known history of Parkinson's disease, lymphoma, GERD, anxiety, asthma presents to the emergency department for evaluation of confusion, gait instability and multiple falls.  Patient was in a usual state of health until the past several days when he is wife has noticed increasing confusion on top of his baseline dementia.  He also has unsteady gait which has caused several falls.   # Altered mental status  # Hx of dementia and Parkinson's --Family reported worsening confusion, agitation and gait.  Workup largely neg except for mildly abnormal UA and dehydration. --Started on Rocephin and MIVF on presentation PLAN: --Treat possible UTI and dehydration --Monitor mental status --continue home Sinemet, NUPLAZID, donepezil --Hold abilify due to possible neg interaction with Nuplazid --Atarax PRN for acute agitation --neuro consult today  # Acute kidney injury, resolved --Cr improved after MIVF --d/c MIVF  # Questionable UTI --No urinary symptoms.  Started on ceftriaxone on presentation.   --urine cx 10,000 Enterococcus Faecalis, likely colonization --continue ceftriaxone for now since pt had mental status change with unknown etiology    DVT prophylaxis: Lovenox SQ Code Status: Full code  Family Communication: wife and daughter updated today Status is: inpatient Dispo:   The patient is from: home Anticipated d/c is to: home Anticipated d/c date is: 2-3 days Patient currently is not medically stable to d/c due to: AMS unresolved, etiology unclear, pt currently not safe to discharge home.   Subjective and Interval History:  Pt said he had no pain.  No fever, N/V/D, dysuria.  Wife reported pt being agitated, however, pt seemed lethargic.      Objective: Vitals:   04/01/20 0358 04/01/20 0751 04/01/20 1331 04/01/20 1609  BP: 135/74 (!) 152/94 92/67 131/86  Pulse: 84 69 67 62  Resp: 18 18 18 15   Temp: 98.9 F (37.2 C) 98.4 F (36.9 C) 98.2 F (36.8 C) 98 F (36.7 C)  TempSrc: Oral Oral Oral Oral  SpO2:  95% 96% 99%  Weight:      Height:        Intake/Output Summary (Last 24 hours) at 04/01/2020 1642 Last data filed at 04/01/2020 1206 Gross per 24 hour  Intake 1553 ml  Output 700 ml  Net 853 ml   Filed Weights   03/30/20 1719  Weight: 74.8 kg    Examination:   Constitutional: NAD, not oriented, lethargic CV: RRR no M,R,G. Distal pulses +2.  No cyanosis.   RESP: CTA B/L, normal respiratory effort  GI: +BS, NTND Extremities: No effusions, edema, or tenderness in BLE MSK: Moving all extremities  SKIN: warm, dry and intact Neuro: II - XII grossly intact.  Sensation intact   Data Reviewed: I have personally reviewed following labs and imaging studies  CBC: Recent Labs  Lab 03/30/20 1721 03/31/20 0441 04/01/20 0415  WBC 5.5 4.1 5.3  HGB 12.0* 12.0* 12.6*  HCT 37.2* 36.5* 36.4*  MCV 94.9 94.8 91.7  PLT 209 191 086   Basic Metabolic Panel: Recent Labs  Lab 03/30/20 1721 03/31/20 0441 04/01/20 0415  NA 135 140 143  K 3.9 3.7 3.7  CL 100 104 109  CO2 26 29 28   GLUCOSE 97 95 87  BUN 34* 24* 18  CREATININE 1.26* 0.85 0.84  CALCIUM 8.9  8.6* 8.7*  MG  --  2.1 2.1  PHOS  --  2.6  --    GFR: Estimated Creatinine Clearance: 58 mL/min (by C-G formula based on SCr of 0.84 mg/dL). Liver Function Tests: No results for input(s): AST, ALT, ALKPHOS, BILITOT, PROT, ALBUMIN in the last 168 hours. No results for input(s): LIPASE, AMYLASE in the last 168 hours. No results for input(s): AMMONIA in the last 168 hours. Coagulation Profile: No results for input(s): INR, PROTIME in the last 168 hours. Cardiac Enzymes: No results for input(s): CKTOTAL, CKMB, CKMBINDEX, TROPONINI in the last 168 hours. BNP  (last 3 results) No results for input(s): PROBNP in the last 8760 hours. HbA1C: No results for input(s): HGBA1C in the last 72 hours. CBG: No results for input(s): GLUCAP in the last 168 hours. Lipid Profile: No results for input(s): CHOL, HDL, LDLCALC, TRIG, CHOLHDL, LDLDIRECT in the last 72 hours. Thyroid Function Tests: No results for input(s): TSH, T4TOTAL, FREET4, T3FREE, THYROIDAB in the last 72 hours. Anemia Panel: No results for input(s): VITAMINB12, FOLATE, FERRITIN, TIBC, IRON, RETICCTPCT in the last 72 hours. Sepsis Labs: No results for input(s): PROCALCITON, LATICACIDVEN in the last 168 hours.  Recent Results (from the past 240 hour(s))  Urine culture     Status: Abnormal (Preliminary result)   Collection Time: 03/30/20  5:27 PM   Specimen: Urine, Clean Catch  Result Value Ref Range Status   Specimen Description   Final    URINE, CLEAN CATCH Performed at [facility], [address], [place], [place] [address]    Special Requests   Final    NONE Performed at [facility], [place], [place] [address]    Culture 10,000 COLONIES/mL ENTEROCOCCUS FAECALIS (A)  Final   Report Status PENDING  Incomplete  SARS Coronavirus 2 by RT PCR (hospital order, performed in [place] hospital lab) Nasopharyngeal Nasopharyngeal Swab     Status: None   Collection Time: 03/30/20 10:26 PM   Specimen: Nasopharyngeal Swab  Result Value Ref Range Status   SARS Coronavirus 2 NEGATIVE NEGATIVE Final    Comment: (NOTE) SARS-CoV-2 target nucleic acids are NOT DETECTED. The SARS-CoV-2 RNA is generally detectable in upper and lower respiratory specimens during the acute phase of infection. The lowest concentration of SARS-CoV-2 viral copies this assay can detect is 250 copies / mL. A negative result does not preclude SARS-CoV-2 infection and should not be used as the sole basis for treatment or other patient management decisions.  A negative result may occur  with improper specimen collection / handling, submission of specimen other than nasopharyngeal swab, presence of viral mutation(s) within the areas targeted by this assay, and inadequate number of viral copies (<250 copies / mL). A negative result must be combined with clinical observations, patient history, and epidemiological information. Fact Sheet for Patients:   StrictlyIdeas.no Fact Sheet for Healthcare Providers: BankingDealers.co.za This test is not yet approved or cleared  by the [place] FDA and has been authorized for detection and/or diagnosis of SARS-CoV-2 by FDA under an Emergency Use Authorization (EUA).  This EUA will remain in effect (meaning this test can be used) for the duration of the COVID-19 declaration under Section 564(b)(1) of the Act, 21 U.S.C. section 360bbb-3(b)(1), unless the authorization is terminated or revoked sooner. Performed at [facility], [address] [place], [place] [address]       Radiology Studies: DG Chest 2 View  Result Date: 03/30/2020 CLINICAL DATA:  Altered mental  status. EXAM: CHEST - 2 VIEW COMPARISON:  October 01, 2019 FINDINGS: The lungs are hyperinflated. There is no evidence of acute infiltrate, pleural effusion or pneumothorax. The heart size and mediastinal contours are within normal limits. Anterior dislocation of the right shoulder is seen. IMPRESSION: 1. No active cardiopulmonary disease. 2. Anterior dislocation of the right shoulder. Electronically Signed   By: Virgina Norfolk M.D.   On: 03/30/2020 18:28   DG Shoulder Right  Result Date: 03/30/2020 CLINICAL DATA:  86 year old male with right shoulder pain and history of falls. EXAM: RIGHT SHOULDER - 2+ VIEW COMPARISON:  Chest radiographs earlier today, 10/01/2019. FINDINGS: Asymmetry of the right glenohumeral joint noted on the earlier chest radiographs today, although appears fairly chronic when compared to  December. And on scapular Y-view of this exam there is no convincing glenohumeral joint dislocation (image 3). There is severe glenohumeral joint space loss and bulky osteophytosis with subchondral sclerosis. The proximal right humerus and the right scapula appear to remain intact. There is right AC joint degeneration with combined degenerative spurring and chronic joint space widening or subarticular erosion, stable since December. The right clavicle otherwise appears intact. Stable visible right chest.  Visible right ribs appear intact. IMPRESSION: 1. Severe chronic right glenohumeral degeneration but no convincing shoulder dislocation or acute fracture. 2. Moderate to severe right AC joint degeneration appears stable since December. Electronically Signed   By: Genevie Ann M.D.   On: 03/30/2020 21:46   CT Head Wo Contrast  Result Date: 03/30/2020 CLINICAL DATA:  Headache, Parkinson's; headache, acute, normal neuro exam. EXAM: CT HEAD WITHOUT CONTRAST TECHNIQUE: Contiguous axial images were obtained from the base of the skull through the vertex without intravenous contrast. COMPARISON:  No pertinent prior studies available for comparison. FINDINGS: Brain: Moderate generalized parenchymal atrophy. Mild ill-defined hypoattenuation within the cerebral white matter is nonspecific, but consistent with chronic small vessel ischemic disease. There is no acute intracranial hemorrhage. No demarcated cortical infarct. No extra-axial fluid collection. No evidence of intracranial mass. No midline shift. Vascular: No hyperdense vessel.  Atherosclerotic calcifications. Skull: Normal. Negative for fracture or focal lesion. Sinuses/Orbits: Visualized orbits show no acute finding. Mild ethmoid and left frontal sinus mucosal thickening. Additionally, there is mucosal thickening within the partially imaged maxillary sinuses. No significant mastoid effusion. IMPRESSION: No evidence of acute intracranial abnormality. Moderate  generalized parenchymal atrophy with mild chronic small vessel ischemic changes. Paranasal sinus mucosal thickening as described. Electronically Signed   By: Kellie Simmering DO   On: 03/30/2020 18:29     Scheduled Meds: . carbidopa-levodopa  2 tablet Oral TID  . docusate sodium  100 mg Oral BID  . donepezil  10 mg Oral QHS  . enoxaparin (LOVENOX) injection  40 mg Subcutaneous Q24H  . feeding supplement  1 Container Oral TID BM  . finasteride  5 mg Oral Daily  . midodrine  5 mg Oral TID WC  . Pimavanserin Tartrate  34 mg Oral Daily  . sertraline  100 mg Oral Daily  . sodium chloride flush  3 mL Intravenous Once   Continuous Infusions: . sodium chloride 75 mL/hr at 04/01/20 1206  . cefTRIAXone (ROCEPHIN)  IV Stopped (03/31/20 1837)     LOS: 2 days     Enzo Bi, MD [facility] If 7PM-7AM, please contact night-coverage 04/01/2020, 4:42 PM

## 2020-04-02 LAB — CBC
HCT: 35.9 % — ABNORMAL LOW (ref 39.0–52.0)
Hemoglobin: 12.2 g/dL — ABNORMAL LOW (ref 13.0–17.0)
MCH: 31.4 pg (ref 26.0–34.0)
MCHC: 34 g/dL (ref 30.0–36.0)
MCV: 92.3 fL (ref 80.0–100.0)
Platelets: 189 10*3/uL (ref 150–400)
RBC: 3.89 MIL/uL — ABNORMAL LOW (ref 4.22–5.81)
RDW: 13.8 % (ref 11.5–15.5)
WBC: 8.1 10*3/uL (ref 4.0–10.5)
nRBC: 0 % (ref 0.0–0.2)

## 2020-04-02 LAB — BASIC METABOLIC PANEL WITH GFR
Anion gap: 9 (ref 5–15)
BUN: 20 mg/dL (ref 8–23)
CO2: 25 mmol/L (ref 22–32)
Calcium: 8.8 mg/dL — ABNORMAL LOW (ref 8.9–10.3)
Chloride: 106 mmol/L (ref 98–111)
Creatinine, Ser: 0.93 mg/dL (ref 0.61–1.24)
GFR calc Af Amer: >60 mL/min
GFR calc non Af Amer: >60 mL/min
Glucose, Bld: 102 mg/dL — ABNORMAL HIGH (ref 70–99)
Potassium: 3.6 mmol/L (ref 3.5–5.1)
Sodium: 140 mmol/L (ref 135–145)

## 2020-04-02 LAB — MAGNESIUM: Magnesium: 2 mg/dL (ref 1.7–2.4)

## 2020-04-02 MED ORDER — AMOXICILLIN 500 MG PO CAPS
500.0000 mg | ORAL_CAPSULE | Freq: Three times a day (TID) | ORAL | Status: DC
  Administered 2020-04-02 – 2020-04-03 (×4): 500 mg via ORAL
  Filled 2020-04-02 (×5): qty 1

## 2020-04-02 NOTE — Evaluation (Signed)
Physical Therapy Evaluation Patient Details Name: Luke Durham MRN: [id number] DOB: [birth year] Today's Date: 04/02/2020   History of Present Illness  86 y.o. male admitted with AMS, worsening gait, multiple falls, increasing confusion on top of his baseline dementia, UTI. MRI of brain reported no acute intracranial abnormality. Patient lives at home with spouse and family wants to take patient home at discharge   Clinical Impression  Patient is lethargic initially sleeping and lethargic but arouses with tactile and verbal stimulation. Patient oriented to self, place, and able to follow single step commands consistently. Patient required Min A for bed mobility, Min A for transfers, and Min A for ambulating a short distance in room with rolling walker. Patient has baseline Parkinson's and has festinating gait. Patient requires some assistance at baseline with ADLs but spouse reports patient can walk without physical assistance at home using rollator. Patient presents with impaired functional mobility and would benefit from PT to maximize function and safety to facilitate return to PLOF. Family wishes to take patient home, would recommend HHPT and family assistance for mobility for safety. If family is unable to provide assistance at home, patient would need higher level of care at [facility].     Follow Up Recommendations Home health PT;Supervision/Assistance - 24 hour    Equipment Recommendations  Rolling walker with 5" wheels;3in1 (PT)    Recommendations for Other Services       Precautions / Restrictions Precautions Precautions: Fall Restrictions Weight Bearing Restrictions: No      Mobility  Bed Mobility Overal bed mobility: Needs Assistance Bed Mobility: Supine to Sit     Supine to sit: Min assist     General bed mobility comments: assistance for upright trunk and verbal cues for technique   Transfers Overall transfer level: Needs assistance Equipment used: 1 person hand held  assist Transfers: Sit to/from Stand Sit to Stand: Min assist         General transfer comment: 4 bouts of standing performed. lifting assistance required for standing. verbal cues for hand placement for safety   Ambulation/Gait Ambulation/Gait assistance: Min assist Gait Distance (Feet): 16 Feet Assistive device: Rolling walker (2 wheeled) Gait Pattern/deviations: Festinating;Trunk flexed;Narrow base of support Gait velocity: decreased    General Gait Details: patient required verbal and visual cues to increase step length and required Min A for steadying with negotiating turns. spouse reports this appears similar to baseline however patient usually does not always require phyiscal assistance for walking   Stairs            Wheelchair Mobility    Modified Rankin (Stroke Patients Only)       Balance Overall balance assessment: Needs assistance Sitting-balance support: Bilateral upper extremity supported;Feet supported Sitting balance-Leahy Scale: Fair Sitting balance - Comments: no gross loss of balance in sitting position    Standing balance support: Bilateral upper extremity supported Standing balance-Leahy Scale: Fair Standing balance comment: with rolling walker for UE support. SBA for safety                              Pertinent Vitals/Pain Pain Assessment: No/denies pain    Home Living Family/patient expects to be discharged to:: Private residence Living Arrangements: Spouse/significant other Available Help at Discharge: Family;Home health Type of Home: House Home Access: Level entry     Home Layout: One level Home Equipment: Environmental consultant - 4 wheels;Shower seat Additional Comments: patient has caregivers multiple days per week that assist with  ADLs per spouse report     Prior Function Level of Independence: Needs assistance   Gait / Transfers Assistance Needed: supervision from spouse, always uses a rollator            Hand Dominance    Dominant Hand: Right    Extremity/Trunk Assessment   Upper Extremity Assessment Upper Extremity Assessment: Generalized weakness    Lower Extremity Assessment Lower Extremity Assessment: RLE deficits/detail;LLE deficits/detail RLE Deficits / Details: grossly 4/5 throughout- dorsiflexion, plantarflexion, knee extension  LLE Deficits / Details: grossly 4/5 throughout- dorsiflexion, plantarflexion, knee extension        Communication   Communication: No difficulties  Cognition Arousal/Alertness: Awake/alert (lethargic initially but arouses with stimulation ) Behavior During Therapy: WFL for tasks assessed/performed Overall Cognitive Status: Impaired/Different from baseline Area of Impairment: Memory;Safety/judgement;Orientation (spouse reports decline from baseline  )                 Orientation Level: Time;Situation                    General Comments      Exercises     Assessment/Plan    PT Assessment Patient needs continued PT services  PT Problem List Decreased strength;Decreased range of motion;Decreased activity tolerance;Decreased balance;Decreased mobility;Decreased cognition       PT Treatment Interventions Gait training;DME instruction;Functional mobility training;Therapeutic activities;Therapeutic exercise;Balance training;Neuromuscular re-education;Patient/family education    PT Goals (Current goals can be found in the Care Plan section)  Acute Rehab PT Goals Patient Stated Goal: to get up and walk  PT Goal Formulation: With patient/family Time For Goal Achievement: 04/16/20 Potential to Achieve Goals: Good    Frequency Min 2X/week   Barriers to discharge        Co-evaluation               AM-PAC PT "6 Clicks" Mobility  Outcome Measure Help needed turning from your back to your side while in a flat bed without using bedrails?: A Little Help needed moving from lying on your back to sitting on the side of a flat bed without using  bedrails?: A Little Help needed moving to and from a bed to a chair (including a wheelchair)?: A Little Help needed standing up from a chair using your arms (e.g., wheelchair or bedside chair)?: A Little Help needed to walk in hospital room?: A Little Help needed climbing 3-5 steps with a railing? : A Lot 6 Click Score: 17    End of Session Equipment Utilized During Treatment: Gait belt Activity Tolerance: Patient tolerated treatment well Patient left: in chair;with chair alarm set;with family/visitor present;with call bell/phone within reach Nurse Communication: Mobility status PT Visit Diagnosis: Unsteadiness on feet (R26.81);Other abnormalities of gait and mobility (R26.89);Muscle weakness (generalized) (M62.81);Repeated falls (R29.6)    Time: 8502-7741 PT Time Calculation (min) (ACUTE ONLY): 42 min   Charges:   PT Evaluation $PT Eval Low Complexity: 1 Low PT Treatments $Gait Training: 8-22 mins $Therapeutic Activity: 8-22 mins       Minna Merritts, PT, MPT   Percell Locus 04/02/2020, 10:16 AM

## 2020-04-02 NOTE — Care Management Important Message (Signed)
Important Message  Patient Details  Name: Luke Durham MRN: [id number] Date of Birth: [birth year]   Medicare Important Message Given:  Yes     Dannette Barbara 04/02/2020, 1:06 PM

## 2020-04-02 NOTE — Progress Notes (Signed)
PROGRESS NOTE    Luke Durham  WCH:[id number] DOB: [birth year] DOA: 03/30/2020 PCP: Mar Daring, PA-C    Assessment & Plan:   Active Problems:   Altered mental status    Luke Durham is a 86 y.o. Caucasian male with a known history of Parkinson's disease, lymphoma, GERD, anxiety, asthma presents to the emergency department for evaluation of confusion, gait instability and multiple falls.  Patient was in a usual state of health until the past several days when he is wife has noticed increasing confusion on top of his baseline dementia.  He also has unsteady gait which has caused several falls.   # Altered mental status  # Hx of dementia and Parkinson's --Family reported worsening confusion, agitation and gait.  Workup largely neg except for mildly abnormal UA and dehydration. --Started on Rocephin and MIVF on presentation --neuro consulted --MRI brain no acute finding PLAN: --Treat possible UTI and dehydration --Monitor mental status --continue home Sinemet, NUPLAZID, donepezil --Hold abilify due to possible neg interaction with Nuplazid --d/c Atarax PRN  # Acute kidney injury, resolved --Cr improved after MIVF --Hold MIVF and encourage oral hydration  # Questionable UTI --No urinary symptoms.  Started on ceftriaxone on presentation.   --urine cx 10,000 Enterococcus Faecalis, likely colonization PLAN: --transition to amoxicillin today   DVT prophylaxis: Lovenox SQ Code Status: Full code  Family Communication:  Status is: inpatient Dispo:   The patient is from: home Anticipated d/c is to: home with HHPT Anticipated d/c date is: tomorrow Patient currently is medically stable to d/c. AMS resolving.     Subjective and Interval History:  Pt had no complaints.  No fever, dyspnea, chest pain, abdominal pain, N/V/D.  Asked and drank a boost.  A lot more alert and coherent today.   Objective: Vitals:   04/02/20 0349 04/02/20 0744 04/02/20 1210 04/02/20  1612  BP: (!) 143/77 (!) 152/74 103/60 110/74  Pulse: (!) 58 60 71 61  Resp: 16 15 18 15   Temp: 98.6 F (37 C) 99.4 F (37.4 C) 98.1 F (36.7 C) 98 F (36.7 C)  TempSrc: Axillary Axillary Oral Oral  SpO2: 99% 100% 100% 97%  Weight:      Height:        Intake/Output Summary (Last 24 hours) at 04/02/2020 1644 Last data filed at 04/02/2020 1414 Gross per 24 hour  Intake 240 ml  Output 701 ml  Net -461 ml   Filed Weights   03/30/20 1719  Weight: 74.8 kg    Examination:   Constitutional: NAD, not oriented, more alert and coherent today, responding to questions and making requests CV: RRR no M,R,G. Distal pulses +2.  No cyanosis.   RESP: CTA B/L, normal respiratory effort  GI: +BS, NTND Extremities: No effusions, edema, or tenderness in BLE MSK: Moving all extremities  SKIN: warm, dry and intact Neuro: II - XII grossly intact.  Sensation intact   Data Reviewed: I have personally reviewed following labs and imaging studies  CBC: Recent Labs  Lab 03/30/20 1721 03/31/20 0441 04/01/20 0415 04/02/20 0626  WBC 5.5 4.1 5.3 8.1  HGB 12.0* 12.0* 12.6* 12.2*  HCT 37.2* 36.5* 36.4* 35.9*  MCV 94.9 94.8 91.7 92.3  PLT 209 191 207 353   Basic Metabolic Panel: Recent Labs  Lab 03/30/20 1721 03/31/20 0441 04/01/20 0415 04/02/20 0626  NA 135 140 143 140  K 3.9 3.7 3.7 3.6  CL 100 104 109 106  CO2 26 29 28 25   GLUCOSE 97 95 87  102*  BUN 34* 24* 18 20  CREATININE 1.26* 0.85 0.84 0.93  CALCIUM 8.9 8.6* 8.7* 8.8*  MG  --  2.1 2.1 2.0  PHOS  --  2.6  --   --    GFR: Estimated Creatinine Clearance: 52.4 mL/min (by C-G formula based on SCr of 0.93 mg/dL). Liver Function Tests: No results for input(s): AST, ALT, ALKPHOS, BILITOT, PROT, ALBUMIN in the last 168 hours. No results for input(s): LIPASE, AMYLASE in the last 168 hours. No results for input(s): AMMONIA in the last 168 hours. Coagulation Profile: No results for input(s): INR, PROTIME in the last 168  hours. Cardiac Enzymes: No results for input(s): CKTOTAL, CKMB, CKMBINDEX, TROPONINI in the last 168 hours. BNP (last 3 results) No results for input(s): PROBNP in the last 8760 hours. HbA1C: No results for input(s): HGBA1C in the last 72 hours. CBG: No results for input(s): GLUCAP in the last 168 hours. Lipid Profile: No results for input(s): CHOL, HDL, LDLCALC, TRIG, CHOLHDL, LDLDIRECT in the last 72 hours. Thyroid Function Tests: No results for input(s): TSH, T4TOTAL, FREET4, T3FREE, THYROIDAB in the last 72 hours. Anemia Panel: No results for input(s): VITAMINB12, FOLATE, FERRITIN, TIBC, IRON, RETICCTPCT in the last 72 hours. Sepsis Labs: No results for input(s): PROCALCITON, LATICACIDVEN in the last 168 hours.  Recent Results (from the past 240 hour(s))  Urine culture     Status: Abnormal (Preliminary result)   Collection Time: 03/30/20  5:27 PM   Specimen: Urine, Clean Catch  Result Value Ref Range Status   Specimen Description   Final    URINE, CLEAN CATCH Performed at [facility], [address] [place], [place] [address]    Special Requests   Final    NONE Performed at [facility], [place], [place] [address]    Culture (A)  Final    10,000 COLONIES/mL ENTEROCOCCUS FAECALIS SUSCEPTIBILITIES TO FOLLOW Performed at [facility], [place] [address] [place], [place] [address]    Report Status PENDING  Incomplete  SARS Coronavirus 2 by RT PCR (hospital order, performed in [facility] hospital lab) Nasopharyngeal Nasopharyngeal Swab     Status: None   Collection Time: 03/30/20 10:26 PM   Specimen: Nasopharyngeal Swab  Result Value Ref Range Status   SARS Coronavirus 2 NEGATIVE NEGATIVE Final    Comment: (NOTE) SARS-CoV-2 target nucleic acids are NOT DETECTED. The SARS-CoV-2 RNA is generally detectable in upper and lower respiratory specimens during the acute phase of infection. The lowest concentration of SARS-CoV-2 viral  copies this assay can detect is 250 copies / mL. A negative result does not preclude SARS-CoV-2 infection and should not be used as the sole basis for treatment or other patient management decisions.  A negative result may occur with improper specimen collection / handling, submission of specimen other than nasopharyngeal swab, presence of viral mutation(s) within the areas targeted by this assay, and inadequate number of viral copies (<250 copies / mL). A negative result must be combined with clinical observations, patient history, and epidemiological information. Fact Sheet for Patients:   StrictlyIdeas.no Fact Sheet for Healthcare Providers: BankingDealers.co.za This test is not yet approved or cleared  by the [place] FDA and has been authorized for detection and/or diagnosis of SARS-CoV-2 by FDA under an Emergency Use Authorization (EUA).  This EUA will remain in effect (meaning this test can be used) for the duration of the COVID-19 declaration under Section 564(b)(1) of the Act, 21 U.S.C. section 360bbb-3(b)(1),  unless the authorization is terminated or revoked sooner. Performed at [facility], [address] [place], [place] [address]       Radiology Studies: MR BRAIN WO CONTRAST  Result Date: 04/01/2020 CLINICAL DATA:  Altered mental status EXAM: MRI HEAD WITHOUT CONTRAST TECHNIQUE: Multiplanar, multiecho pulse sequences of the brain and surrounding structures were obtained without intravenous contrast. COMPARISON:  None. FINDINGS: BRAIN: No acute infarct, acute hemorrhage or extra-axial collection. Multifocal white matter hyperintensity, most commonly due to chronic ischemic microangiopathy. There is generalized atrophy without lobar predilection. No chronic microhemorrhage. Normal midline structures. VASCULAR: Major flow voids are preserved. SKULL AND UPPER CERVICAL SPINE: Normal calvarium and skull base. Visualized  upper cervical spine and soft tissues are normal. SINUSES/ORBITS: Left maxillary sinus mucosal thickening and opacification. No mastoid effusion. Normal orbits. IMPRESSION: 1. No acute intracranial abnormality. 2. Generalized atrophy and findings of chronic ischemic microangiopathy. Electronically Signed   By: Ulyses Jarred M.D.   On: 04/01/2020 22:35     Scheduled Meds: . amoxicillin  500 mg Oral Q8H  . carbidopa-levodopa  2 tablet Oral TID  . docusate sodium  100 mg Oral BID  . donepezil  10 mg Oral QHS  . enoxaparin (LOVENOX) injection  40 mg Subcutaneous Q24H  . feeding supplement  1 Container Oral TID BM  . finasteride  5 mg Oral Daily  . midodrine  5 mg Oral TID WC  . Pimavanserin Tartrate  34 mg Oral Daily  . sertraline  100 mg Oral Daily  . sodium chloride flush  3 mL Intravenous Once   Continuous Infusions:    LOS: 3 days     Enzo Bi, MD [facility] If 7PM-7AM, please contact night-coverage 04/02/2020, 4:44 PM

## 2020-04-02 NOTE — Progress Notes (Signed)
Subjective: Patient out of bed in recliner.  Awake and alert.  Wife reports that he is much better and very close to baseline.    Objective: Current vital signs: BP (!) 152/74 (BP Location: Left Arm)   Pulse 60   Temp 99.4 F (37.4 C) (Axillary)   Resp 15   Ht 5\' 6"  (1.676 m)   Wt 74.8 kg   SpO2 100%   BMI 26.63 kg/m  Vital signs in last 24 hours: Temp:  [98 F (36.7 C)-99.4 F (37.4 C)] 99.4 F (37.4 C) (06/10 0744) Pulse Rate:  [58-67] 60 (06/10 0744) Resp:  [15-18] 15 (06/10 0744) BP: (92-152)/(65-86) 152/74 (06/10 0744) SpO2:  [96 %-100 %] 100 % (06/10 0744)  Intake/Output from previous day: 06/09 0701 - 06/10 0700 In: 607.5 [I.V.:607.5] Out: 1400 [Urine:1400] Intake/Output this shift: Total I/O In: 240 [P.O.:240] Out: -  Nutritional status:  Diet Order            Diet regular Room service appropriate? Yes; Fluid consistency: Thin  Diet effective now                 Neurologic Exam: Mental Status: Alert, thought content appropriate.  Speech fluent without evidence of aphasia.  Able to follow simple commands without difficulty. Cranial Nerves: II: Visual fields grossly normal, pupils equal, round, reactive to light and accommodation III,IV, VI: ptosis not present, extra-ocular motions intact bilaterally V,VII: smile symmetric, facial light touch sensation normal bilaterally VIII: hearing normal bilaterally IX,X: gag reflex present XI: bilateral shoulder shrug XII: midline tongue extension Motor: Able to lift all extremities against gravity.    Lab Results: Basic Metabolic Panel: Recent Labs  Lab 03/30/20 1721 03/30/20 1721 03/31/20 0441 04/01/20 0415 04/02/20 0626  NA 135  --  140 143 140  K 3.9  --  3.7 3.7 3.6  CL 100  --  104 109 106  CO2 26  --  29 28 25   GLUCOSE 97  --  95 87 102*  BUN 34*  --  24* 18 20  CREATININE 1.26*  --  0.85 0.84 0.93  CALCIUM 8.9   < > 8.6* 8.7* 8.8*  MG  --   --  2.1 2.1 2.0  PHOS  --   --  2.6  --   --     < > = values in this interval not displayed.    Liver Function Tests: No results for input(s): AST, ALT, ALKPHOS, BILITOT, PROT, ALBUMIN in the last 168 hours. No results for input(s): LIPASE, AMYLASE in the last 168 hours. No results for input(s): AMMONIA in the last 168 hours.  CBC: Recent Labs  Lab 03/30/20 1721 03/31/20 0441 04/01/20 0415 04/02/20 0626  WBC 5.5 4.1 5.3 8.1  HGB 12.0* 12.0* 12.6* 12.2*  HCT 37.2* 36.5* 36.4* 35.9*  MCV 94.9 94.8 91.7 92.3  PLT 209 191 207 189    Cardiac Enzymes: No results for input(s): CKTOTAL, CKMB, CKMBINDEX, TROPONINI in the last 168 hours.  Lipid Panel: No results for input(s): CHOL, TRIG, HDL, CHOLHDL, VLDL, LDLCALC in the last 168 hours.  CBG: No results for input(s): GLUCAP in the last 168 hours.  Microbiology: Results for orders placed or performed during the hospital encounter of 03/30/20  Urine culture     Status: Abnormal (Preliminary result)   Collection Time: 03/30/20  5:27 PM   Specimen: Urine, Clean Catch  Result Value Ref Range Status   Specimen Description   Final    URINE, CLEAN  CATCH Performed at [facility], [address], [place], [place] [address]    Special Requests   Final    NONE Performed at [facility], [address], [place], [place] [address]    Culture (A)  Final    10,000 COLONIES/mL ENTEROCOCCUS FAECALIS SUSCEPTIBILITIES TO FOLLOW Performed at [facility], [place] [address] [place], [place] [address]    Report Status PENDING  Incomplete  SARS Coronavirus 2 by RT PCR (hospital order, performed in [facility] hospital lab) Nasopharyngeal Nasopharyngeal Swab     Status: None   Collection Time: 03/30/20 10:26 PM   Specimen: Nasopharyngeal Swab  Result Value Ref Range Status   SARS Coronavirus 2 NEGATIVE NEGATIVE Final    Comment: (NOTE) SARS-CoV-2 target nucleic acids are NOT DETECTED. The SARS-CoV-2 RNA is generally detectable in upper and lower respiratory  specimens during the acute phase of infection. The lowest concentration of SARS-CoV-2 viral copies this assay can detect is 250 copies / mL. A negative result does not preclude SARS-CoV-2 infection and should not be used as the sole basis for treatment or other patient management decisions.  A negative result may occur with improper specimen collection / handling, submission of specimen other than nasopharyngeal swab, presence of viral mutation(s) within the areas targeted by this assay, and inadequate number of viral copies (<250 copies / mL). A negative result must be combined with clinical observations, patient history, and epidemiological information. Fact Sheet for Patients:   StrictlyIdeas.no Fact Sheet for Healthcare Providers: BankingDealers.co.za This test is not yet approved or cleared  by the [place] FDA and has been authorized for detection and/or diagnosis of SARS-CoV-2 by FDA under an Emergency Use Authorization (EUA).  This EUA will remain in effect (meaning this test can be used) for the duration of the COVID-19 declaration under Section 564(b)(1) of the Act, 21 U.S.C. section 360bbb-3(b)(1), unless the authorization is terminated or revoked sooner. Performed at [facility], [place] [place], [place] [address]     Coagulation Studies: No results for input(s): LABPROT, INR in the last 72 hours.  Imaging: MR BRAIN WO CONTRAST  Result Date: 04/01/2020 CLINICAL DATA:  Altered mental status EXAM: MRI HEAD WITHOUT CONTRAST TECHNIQUE: Multiplanar, multiecho pulse sequences of the brain and surrounding structures were obtained without intravenous contrast. COMPARISON:  None. FINDINGS: BRAIN: No acute infarct, acute hemorrhage or extra-axial collection. Multifocal white matter hyperintensity, most commonly due to chronic ischemic microangiopathy. There is generalized atrophy without lobar predilection. No  chronic microhemorrhage. Normal midline structures. VASCULAR: Major flow voids are preserved. SKULL AND UPPER CERVICAL SPINE: Normal calvarium and skull base. Visualized upper cervical spine and soft tissues are normal. SINUSES/ORBITS: Left maxillary sinus mucosal thickening and opacification. No mastoid effusion. Normal orbits. IMPRESSION: 1. No acute intracranial abnormality. 2. Generalized atrophy and findings of chronic ischemic microangiopathy. Electronically Signed   By: Ulyses Jarred M.D.   On: 04/01/2020 22:35    Medications:  I have reviewed the patient's current medications. Scheduled: . carbidopa-levodopa  2 tablet Oral TID  . docusate sodium  100 mg Oral BID  . donepezil  10 mg Oral QHS  . enoxaparin (LOVENOX) injection  40 mg Subcutaneous Q24H  . feeding supplement  1 Container Oral TID BM  . finasteride  5 mg Oral Daily  . midodrine  5 mg Oral TID WC  . Pimavanserin Tartrate  34 mg Oral Daily  . sertraline  100 mg Oral Daily  . sodium chloride flush  3 mL Intravenous Once    Assessment/Plan: 86 year old male with a history of Parkinson's disease, lymphoma, GERD, anxiety, asthmapresenting with confusion, gait instability and multiple falls.  Work up significant for dehydration and UTI.  In a patient with dementia and PD these two diagnoses alone may lead to the patient's presentation.  As they improve cognitive nad functional status may lag behind.  In addition patient is on Abilify which may cause side effects as well.  Last dose was yesterday and may take more than 24 hours before completely cleared.  Also placed on Atarax since admission that was to be prn but has been administered TID for the most part.  AMS and worsening gait likely multifactorial and related to above.  MRI of the brain personally reviewed and shows no acute changes.    Recommendations: 1. Agree with therapy 2. No further neurologic intervention is recommended at this time.  If further questions arise,  please call or page at that time.  Thank you for allowing neurology to participate in the care of this patient.    LOS: 3 days   Alexis Goodell, MD Neurology [phone] 04/02/2020  11:46 AM

## 2020-04-03 DIAGNOSIS — R404 Transient alteration of awareness: Secondary | ICD-10-CM

## 2020-04-03 LAB — CBC
HCT: 34.5 % — ABNORMAL LOW (ref 39.0–52.0)
Hemoglobin: 11.3 g/dL — ABNORMAL LOW (ref 13.0–17.0)
MCH: 31.1 pg (ref 26.0–34.0)
MCHC: 32.8 g/dL (ref 30.0–36.0)
MCV: 95 fL (ref 80.0–100.0)
Platelets: 193 10*3/uL (ref 150–400)
RBC: 3.63 MIL/uL — ABNORMAL LOW (ref 4.22–5.81)
RDW: 13.7 % (ref 11.5–15.5)
WBC: 5.6 10*3/uL (ref 4.0–10.5)
nRBC: 0 % (ref 0.0–0.2)

## 2020-04-03 LAB — BASIC METABOLIC PANEL
Anion gap: 7 (ref 5–15)
BUN: 33 mg/dL — ABNORMAL HIGH (ref 8–23)
CO2: 25 mmol/L (ref 22–32)
Calcium: 8.5 mg/dL — ABNORMAL LOW (ref 8.9–10.3)
Chloride: 108 mmol/L (ref 98–111)
Creatinine, Ser: 1.06 mg/dL (ref 0.61–1.24)
GFR calc Af Amer: >60 mL/min (ref >60)
GFR calc non Af Amer: >60 mL/min (ref >60)
Glucose, Bld: 94 mg/dL (ref 70–99)
Potassium: 3.9 mmol/L (ref 3.5–5.1)
Sodium: 140 mmol/L (ref 135–145)

## 2020-04-03 LAB — MAGNESIUM: Magnesium: 2.1 mg/dL (ref 1.7–2.4)

## 2020-04-03 LAB — URINE CULTURE: Culture: 10000 — AB

## 2020-04-03 MED ORDER — SERTRALINE HCL 50 MG PO TABS: 100.0000 mg | ORAL_TABLET | Freq: Every day | ORAL | Status: DC

## 2020-04-03 MED ORDER — AMOXICILLIN 500 MG PO CAPS: 500.0000 mg | ORAL_CAPSULE | Freq: Three times a day (TID) | ORAL | 0 refills | Status: AC

## 2020-04-03 NOTE — TOC Transition Note (Addendum)
Transition of Care [facility]) - CM/SW Discharge Note   Patient Details  Name: Luke Durham MRN: [id number] Date of Birth: 1933/07/17  [facility] CM/SW Contact:  Meriel Flavors, LCSW Phone Number: 04/03/2020, 11:20 AM   Clinical Narrative:    6/11: Spoke with spouse, Luke Durham. She will transport Pt home and daughter will be there to assist when they get home. CSW contacted Helene Kelp w/[facility] [phone]. Confirmed acceptance and notified her of discharge.  6/11: Spoke with Jermaine w/[employer] [phone] will deliver 5 wheel walker and 3-n-1 to home.        Barriers to Discharge: Continued Medical Work up   Patient Goals and CMS Choice Patient states their goals for this hospitalization and ongoing recovery are:: To return home CMS Medicare.gov Compare Post Acute Care list provided to:: Patient Represenative (must comment) Choice offered to / list presented to : Spouse, Adult Children  Discharge Placement                       Discharge Plan and Services   Discharge Planning Services: CM Consult Post Acute Care Choice: [place]: PT [place]: [facility] (now [facility]) Date [place]: 04/01/20 Time [place]: 1108 Representative spoke with at [place]: [place] (SDOH) Interventions     Readmission Risk Interventions No flowsheet data found.

## 2020-04-03 NOTE — Discharge Summary (Signed)
Physician Discharge Summary   Luke Durham  male DOB: 10/07/1933  VOJ:[id number]  PCP: Mar Daring, PA-C  Admit date: 03/30/2020 Discharge date: 04/03/2020  Admitted From: home Disposition:  Home.  PT rec SNF rehab, but family chose to take pt home, with [facility] services and DME ordered. Daughter updated on the phone about discharge plan prior to discharge.  Home Health: Yes CODE STATUS: Full code   Hospital Course:  For full details, please see H&P, progress notes, consult notes and ancillary notes.  Briefly,  Luke Durham a 86 y.o.Caucasian malewith a known history of Parkinson's disease, dementia, lymphoma, GERD, anxiety, asthmapresented to the emergency department for evaluation of confusion, gait instability and multiple falls. Patient was in a usual state of health until the several days prior when his wife noticed increasing confusion on top of his baseline dementia. He also had unsteady gait which had caused several falls.   # Altered mental status, POA, resolved # Hospital delirium, resolved # Hx of dementia and Parkinson's Workup largely neg except for mildly abnormal UA and dehydration.  Pt was started on Rocephin and MIVF on presentation.  Confusion and agitation worsened during the first 2 days of hospitalization, likely due to hospital delirium.  Neuro consulted who ordered MRI brain with no acute finding.  It was noted that Abilify was added about a month PTA, which could be the cause of his change in mental status.  Neurology agreed to d/c Ability and continued his other home meds, Sinemet, NUPLAZID, donepezil.  Mental status gradually improved during his hospitalization and was back to his baseline prior to discharge, per his family.  Pt will follow up with his outpatient neurology.  # Acute kidney injury, resolved Cr 1.26 on presentation, which improved to 0.85 the next day after MIVF.  Pt likely had poor PO intake PTA.  Encouraged oral  hydration.  # Bacteruria No urinary symptoms.  Started on ceftriaxone on presentation due to AMS.  Urine cx 10,000 Enterococcus Faecalis, which was not sensitive to ceftriaxone yet pt's mental status improved.  Pt was switched to amoxicillin and will finish 5 more days at home.     Discharge Diagnoses:  Active Problems:   Altered mental status    Discharge Instructions:  Allergies as of 04/03/2020   No Known Allergies     Medication List    STOP taking these medications   ARIPiprazole 2 MG tablet Commonly known as: ABILIFY     TAKE these medications   amoxicillin 500 MG capsule Commonly known as: AMOXIL Take 1 capsule (500 mg total) by mouth every 8 (eight) hours for 5 days.   carbidopa-levodopa 25-250 MG tablet Commonly known as: SINEMET IR Take 2 tablets by mouth 3 (three) times daily.   cholecalciferol 25 MCG (1000 UNIT) tablet Commonly known as: VITAMIN D Take 1,000 Units by mouth daily.   DOK 100 MG capsule Generic drug: docusate sodium TAKE 1 CAPSULE(100 MG) BY MOUTH TWICE DAILY What changed: See the new instructions.   donepezil 10 MG tablet Commonly known as: ARICEPT Take 10 mg by mouth at bedtime.   finasteride 5 MG tablet Commonly known as: PROSCAR Take 1 tablet (5 mg total) by mouth daily.   midodrine 5 MG tablet Commonly known as: PROAMATINE Take 1 tablet (5 mg total) by mouth 3 (three) times daily with meals.   multivitamin capsule Take 1 capsule by mouth daily.   niacinamide 500 MG tablet   Nuplazid 17 MG Tabs Generic drug: Pimavanserin Tartrate  Take 17 mg by mouth daily.   Nuplazid 34 MG Caps Generic drug: Pimavanserin Tartrate Take 34 mg by mouth daily.   OCUVITE PO Take 1 tablet by mouth daily.   sertraline 50 MG tablet Commonly known as: ZOLOFT Take 2 tablets (100 mg total) by mouth daily.   THERATEARS OP Place 1-2 drops into both eyes at bedtime.        Follow-up Information    Mar Daring, PA-C. Schedule an  appointment as soon as possible for a visit in 1 week(s).   Specialty: Family Medicine Contact information: [place] [address] [phone]        neurology Follow up in 1 week(s).               No Known Allergies   The results of significant diagnostics from this hospitalization (including imaging, microbiology, ancillary and laboratory) are listed below for reference.   Consultations:   Procedures/Studies: DG Chest 2 View  Result Date: 03/30/2020 CLINICAL DATA:  Altered mental status. EXAM: CHEST - 2 VIEW COMPARISON:  October 01, 2019 FINDINGS: The lungs are hyperinflated. There is no evidence of acute infiltrate, pleural effusion or pneumothorax. The heart size and mediastinal contours are within normal limits. Anterior dislocation of the right shoulder is seen. IMPRESSION: 1. No active cardiopulmonary disease. 2. Anterior dislocation of the right shoulder. Electronically Signed   By: Virgina Norfolk M.D.   On: 03/30/2020 18:28   DG Shoulder Right  Result Date: 03/30/2020 CLINICAL DATA:  86 year old male with right shoulder pain and history of falls. EXAM: RIGHT SHOULDER - 2+ VIEW COMPARISON:  Chest radiographs earlier today, 10/01/2019. FINDINGS: Asymmetry of the right glenohumeral joint noted on the earlier chest radiographs today, although appears fairly chronic when compared to December. And on scapular Y-view of this exam there is no convincing glenohumeral joint dislocation (image 3). There is severe glenohumeral joint space loss and bulky osteophytosis with subchondral sclerosis. The proximal right humerus and the right scapula appear to remain intact. There is right AC joint degeneration with combined degenerative spurring and chronic joint space widening or subarticular erosion, stable since December. The right clavicle otherwise appears intact. Stable visible right chest.  Visible right ribs appear intact. IMPRESSION: 1. Severe chronic right  glenohumeral degeneration but no convincing shoulder dislocation or acute fracture. 2. Moderate to severe right AC joint degeneration appears stable since December. Electronically Signed   By: Genevie Ann M.D.   On: 03/30/2020 21:46   CT Head Wo Contrast  Result Date: 03/30/2020 CLINICAL DATA:  Headache, Parkinson's; headache, acute, normal neuro exam. EXAM: CT HEAD WITHOUT CONTRAST TECHNIQUE: Contiguous axial images were obtained from the base of the skull through the vertex without intravenous contrast. COMPARISON:  No pertinent prior studies available for comparison. FINDINGS: Brain: Moderate generalized parenchymal atrophy. Mild ill-defined hypoattenuation within the cerebral white matter is nonspecific, but consistent with chronic small vessel ischemic disease. There is no acute intracranial hemorrhage. No demarcated cortical infarct. No extra-axial fluid collection. No evidence of intracranial mass. No midline shift. Vascular: No hyperdense vessel.  Atherosclerotic calcifications. Skull: Normal. Negative for fracture or focal lesion. Sinuses/Orbits: Visualized orbits show no acute finding. Mild ethmoid and left frontal sinus mucosal thickening. Additionally, there is mucosal thickening within the partially imaged maxillary sinuses. No significant mastoid effusion. IMPRESSION: No evidence of acute intracranial abnormality. Moderate generalized parenchymal atrophy with mild chronic small vessel ischemic changes. Paranasal sinus mucosal thickening as described. Electronically Signed   By:  Kellie Simmering DO   On: 03/30/2020 18:29   MR BRAIN WO CONTRAST  Result Date: 04/01/2020 CLINICAL DATA:  Altered mental status EXAM: MRI HEAD WITHOUT CONTRAST TECHNIQUE: Multiplanar, multiecho pulse sequences of the brain and surrounding structures were obtained without intravenous contrast. COMPARISON:  None. FINDINGS: BRAIN: No acute infarct, acute hemorrhage or extra-axial collection. Multifocal white matter hyperintensity,  most commonly due to chronic ischemic microangiopathy. There is generalized atrophy without lobar predilection. No chronic microhemorrhage. Normal midline structures. VASCULAR: Major flow voids are preserved. SKULL AND UPPER CERVICAL SPINE: Normal calvarium and skull base. Visualized upper cervical spine and soft tissues are normal. SINUSES/ORBITS: Left maxillary sinus mucosal thickening and opacification. No mastoid effusion. Normal orbits. IMPRESSION: 1. No acute intracranial abnormality. 2. Generalized atrophy and findings of chronic ischemic microangiopathy. Electronically Signed   By: Ulyses Jarred M.D.   On: 04/01/2020 22:35      Labs: BNP (last 3 results) No results for input(s): BNP in the last 8760 hours. Basic Metabolic Panel: Recent Labs  Lab 03/30/20 1721 03/31/20 0441 04/01/20 0415 04/02/20 0626 04/03/20 0509  NA 135 140 143 140 140  K 3.9 3.7 3.7 3.6 3.9  CL 100 104 109 106 108  CO2 26 29 28 25 25   GLUCOSE 97 95 87 102* 94  BUN 34* 24* 18 20 33*  CREATININE 1.26* 0.85 0.84 0.93 1.06  CALCIUM 8.9 8.6* 8.7* 8.8* 8.5*  MG  --  2.1 2.1 2.0 2.1  PHOS  --  2.6  --   --   --    Liver Function Tests: No results for input(s): AST, ALT, ALKPHOS, BILITOT, PROT, ALBUMIN in the last 168 hours. No results for input(s): LIPASE, AMYLASE in the last 168 hours. No results for input(s): AMMONIA in the last 168 hours. CBC: Recent Labs  Lab 03/30/20 1721 03/31/20 0441 04/01/20 0415 04/02/20 0626 04/03/20 0509  WBC 5.5 4.1 5.3 8.1 5.6  HGB 12.0* 12.0* 12.6* 12.2* 11.3*  HCT 37.2* 36.5* 36.4* 35.9* 34.5*  MCV 94.9 94.8 91.7 92.3 95.0  PLT 209 191 207 189 193   Cardiac Enzymes: No results for input(s): CKTOTAL, CKMB, CKMBINDEX, TROPONINI in the last 168 hours. BNP: Invalid input(s): POCBNP CBG: No results for input(s): GLUCAP in the last 168 hours. D-Dimer No results for input(s): DDIMER in the last 72 hours. Hgb A1c No results for input(s): HGBA1C in the last 72  hours. Lipid Profile No results for input(s): CHOL, HDL, LDLCALC, TRIG, CHOLHDL, LDLDIRECT in the last 72 hours. Thyroid function studies No results for input(s): TSH, T4TOTAL, T3FREE, THYROIDAB in the last 72 hours.  Invalid input(s): FREET3 Anemia work up No results for input(s): VITAMINB12, FOLATE, FERRITIN, TIBC, IRON, RETICCTPCT in the last 72 hours. Urinalysis    Component Value Date/Time   COLORURINE AMBER (A) 03/30/2020 1727   APPEARANCEUR HAZY (A) 03/30/2020 1727   APPEARANCEUR Clear 10/02/2018 1039   LABSPEC 1.023 03/30/2020 1727   PHURINE 5.0 03/30/2020 1727   GLUCOSEU NEGATIVE 03/30/2020 1727   HGBUR SMALL (A) 03/30/2020 1727   BILIRUBINUR NEGATIVE 03/30/2020 1727   BILIRUBINUR Negative 10/02/2018 1039   KETONESUR 5 (A) 03/30/2020 1727   PROTEINUR 100 (A) 03/30/2020 1727   UROBILINOGEN 0.2 10/05/2016 1640   NITRITE NEGATIVE 03/30/2020 1727   LEUKOCYTESUR SMALL (A) 03/30/2020 1727   Sepsis Labs Invalid input(s): PROCALCITONIN,  WBC,  LACTICIDVEN Microbiology Recent Results (from the past 240 hour(s))  Urine culture     Status: Abnormal   Collection Time: 03/30/20  5:27 PM   Specimen: Urine, Clean Catch  Result Value Ref Range Status   Specimen Description   Final    URINE, CLEAN CATCH Performed at [facility], [address] [place], [place] [address]    Special Requests   Final    NONE Performed at [facility], [place], [place] [address]    Culture 10,000 COLONIES/mL ENTEROCOCCUS FAECALIS (A)  Final   Report Status 04/03/2020 FINAL  Final   Organism ID, Bacteria ENTEROCOCCUS FAECALIS (A)  Final      Susceptibility   Enterococcus faecalis - MIC*    AMPICILLIN <=2 SENSITIVE Sensitive     NITROFURANTOIN <=16 SENSITIVE Sensitive     VANCOMYCIN 1 SENSITIVE Sensitive     * 10,000 COLONIES/mL ENTEROCOCCUS FAECALIS  SARS Coronavirus 2 by RT PCR (hospital order, performed in [facility] hospital lab) Nasopharyngeal  Nasopharyngeal Swab     Status: None   Collection Time: 03/30/20 10:26 PM   Specimen: Nasopharyngeal Swab  Result Value Ref Range Status   SARS Coronavirus 2 NEGATIVE NEGATIVE Final    Comment: (NOTE) SARS-CoV-2 target nucleic acids are NOT DETECTED. The SARS-CoV-2 RNA is generally detectable in upper and lower respiratory specimens during the acute phase of infection. The lowest concentration of SARS-CoV-2 viral copies this assay can detect is 250 copies / mL. A negative result does not preclude SARS-CoV-2 infection and should not be used as the sole basis for treatment or other patient management decisions.  A negative result may occur with improper specimen collection / handling, submission of specimen other than nasopharyngeal swab, presence of viral mutation(s) within the areas targeted by this assay, and inadequate number of viral copies (<250 copies / mL). A negative result must be combined with clinical observations, patient history, and epidemiological information. Fact Sheet for Patients:   StrictlyIdeas.no Fact Sheet for Healthcare Providers: BankingDealers.co.za This test is not yet approved or cleared  by the [place] FDA and has been authorized for detection and/or diagnosis of SARS-CoV-2 by FDA under an Emergency Use Authorization (EUA).  This EUA will remain in effect (meaning this test can be used) for the duration of the COVID-19 declaration under Section 564(b)(1) of the Act, 21 U.S.C. section 360bbb-3(b)(1), unless the authorization is terminated or revoked sooner. Performed at [facility], [address] [place], [place] [address]      Total time spend on discharging this patient, including the last patient exam, discussing the hospital stay, instructions for ongoing care as it relates to all pertinent caregivers, as well as preparing the medical discharge records, prescriptions, and/or referrals as  applicable, is 30 minutes.    Enzo Bi, MD  [facility] 04/03/2020, 10:40 AM  If 7PM-7AM, please contact night-coverage

## 2020-04-06 ENCOUNTER — Telehealth: Payer: Self-pay

## 2020-04-06 ENCOUNTER — Telehealth: Payer: Self-pay | Admitting: Physician Assistant

## 2020-04-06 NOTE — Telephone Encounter (Signed)
Called Luke Durham PT w/ [facility] and left voicemail message for him to return he call. If Luke Durham calls back okay for [facility] to advised him of Jenni's approval for PT service for patient.

## 2020-04-06 NOTE — Telephone Encounter (Signed)
Called to request verbal orders for PT for  3x wk 1, 1xwk 8.  Any questions please call [phone]

## 2020-04-06 NOTE — Telephone Encounter (Signed)
Yes this is ok 

## 2020-04-06 NOTE — Telephone Encounter (Signed)
HFU scheduled 04/10/20 @ 10:40 AM with Fenton Malling.

## 2020-04-06 NOTE — Telephone Encounter (Signed)
Transition Care Management Follow-up Telephone Call  Date of discharge and from where: [facility] on 04/03/20  How have you been since you were released from the hospital? Doing well; still has some confusion but not as bad as prior to his hospital stay. Pt's steadiness is about the same. It gets worse when he is nervous. Wife is trying to get pt to drink more water. Declines pain, urinary s/s, fever, SOB or n/v/d. Pt has had loose bowl movements but wife thinks that is related to taking the amoxicillin.  Any questions or concerns? No   Items Reviewed:  Did the pt receive and understand the discharge instructions provided? Yes   Medications obtained and verified? Yes   Any new allergies since your discharge? No   Dietary orders reviewed? Yes  Do you have support at home? Yes   Other (ie: DME, Home Health, etc): PT was ordered for pt. A bedside commode and a walker was ordered as well.  Functional Questionnaire: (I = Independent and D = Dependent)  Bathing/Dressing- D, needs assistance bathing.   Meal Prep- D  Eating- I, wife cuts up food for him and assists as needed.  Maintaining continence- D, wears depends at all times.  Transferring/Ambulation- D, uses a walker at all times.  Managing Meds- D, wife manages medications.   Follow up appointments reviewed:    PCP Hospital f/u appt confirmed? Yes , scheduled to see Fenton Malling on 04/10/20 @ 3:00 PM.  [facility] f/u appt confirmed? Yes   Are transportation arrangements needed? No   If their condition worsens, is the pt aware to call  their PCP or go to the ED? Yes  Was the patient provided with contact information for the PCP's office or ED? Yes  Was the pt encouraged to call back with questions or concerns? Yes

## 2020-04-07 NOTE — Telephone Encounter (Signed)
[place] [facility] called back, info given.

## 2020-04-10 ENCOUNTER — Encounter: Payer: Self-pay | Admitting: Physician Assistant

## 2020-04-10 ENCOUNTER — Other Ambulatory Visit: Payer: Self-pay

## 2020-04-10 ENCOUNTER — Ambulatory Visit (INDEPENDENT_AMBULATORY_CARE_PROVIDER_SITE_OTHER): Payer: Medicare Other | Admitting: Physician Assistant

## 2020-04-10 ENCOUNTER — Inpatient Hospital Stay: Payer: Medicare Other | Admitting: Physician Assistant

## 2020-04-10 VITALS — BP 104/64 | HR 73 | Temp 97.6°F | Resp 16 | Wt 165.0 lb

## 2020-04-10 DIAGNOSIS — R404 Transient alteration of awareness: Secondary | ICD-10-CM

## 2020-04-10 DIAGNOSIS — N401 Enlarged prostate with lower urinary tract symptoms: Secondary | ICD-10-CM

## 2020-04-10 DIAGNOSIS — N138 Other obstructive and reflux uropathy: Secondary | ICD-10-CM

## 2020-04-10 DIAGNOSIS — Z7689 Persons encountering health services in other specified circumstances: Secondary | ICD-10-CM | POA: Diagnosis not present

## 2020-04-10 DIAGNOSIS — F330 Major depressive disorder, recurrent, mild: Secondary | ICD-10-CM | POA: Diagnosis not present

## 2020-04-10 MED ORDER — FINASTERIDE 5 MG PO TABS: 5.0000 mg | ORAL_TABLET | Freq: Every day | ORAL | 1 refills | Status: DC

## 2020-04-10 MED ORDER — SERTRALINE HCL 100 MG PO TABS: 200.0000 mg | ORAL_TABLET | Freq: Every day | ORAL | 0 refills | Status: DC

## 2020-04-10 NOTE — Progress Notes (Signed)
Established patient visit   Patient: Luke Durham   DOB: 04/09/1933   86 y.o. Male  MRN: [id number] Visit Date: 04/10/2020  Today's healthcare provider: Mar Daring, PA-C   Chief Complaint  Patient presents with  . Follow-up   Subjective    HPI Follow up Hospitalization  Patient was admitted to [facility] on 03/30/2020 and discharged on 04/03/2020 He was treated for altered problems Treatment for this included Rocephin and MIVF on presentation, he had MRI done which was negative.Given Amoxicillin for UTI Telephone follow up was done on 04/06/2020 He reports excellent compliance with treatment. He reports this condition is resolved. Patient reports Abilify was D/C by MD at [facility].  ----------------------------------------------------------------------------------------- -   Patient Active Problem List   Diagnosis Date Noted  . Altered mental status 03/30/2020  . Lab test positive for detection of COVID-19 virus 10/02/2019  . Hypotension 10/02/2019  . Dementia due to Parkinson's disease without behavioral disturbance ([place]) 07/18/2017  . Neurogenic orthostatic hypotension ([place]) 12/02/2016  . BPH with urinary obstruction 11/14/2016  . Urinary frequency 10/10/2016  . Microscopic hematuria 10/10/2016  . Parkinson's syndrome ([place]) 06/14/2016  . Arthritis 06/14/2016  . Depression 06/14/2016  . Heartburn 06/14/2016  . Urinary incontinence 06/14/2016  . Skin lesion 06/14/2016  . Bilateral edema of lower extremity 06/14/2016   Social History   Tobacco Use  . Smoking status: Never Smoker  . Smokeless tobacco: Never Used  Vaping Use  . Vaping Use: Never used  Substance Use Topics  . Alcohol use: Yes    Alcohol/week: 0.0 - 3.0 standard drinks  . Drug use: No       Medications: Outpatient Medications Prior to Visit  Medication Sig  . carbidopa-levodopa (SINEMET IR) 25-250 MG tablet Take 2 tablets by mouth 3 (three) times daily.   . Carboxymethylcellulose  Sodium (THERATEARS OP) Place 1-2 drops into both eyes at bedtime.  . cholecalciferol (VITAMIN D) 1000 units tablet Take 1,000 Units by mouth daily.  Marland Kitchen DOK 100 MG capsule TAKE 1 CAPSULE(100 MG) BY MOUTH TWICE DAILY (Patient taking differently: 100 mg 2 (two) times daily. Docusate sodium)  . donepezil (ARICEPT) 10 MG tablet Take 10 mg by mouth at bedtime.   . entacapone (COMTAN) 200 MG tablet Take 200 mg by mouth 3 (three) times daily.  . midodrine (PROAMATINE) 5 MG tablet Take 1 tablet (5 mg total) by mouth 3 (three) times daily with meals.  . Multiple Vitamin (MULTIVITAMIN) capsule Take 1 capsule by mouth daily.  . Multiple Vitamins-Minerals (OCUVITE PO) Take 1 tablet by mouth daily.   Debby Freiberg Tartrate (NUPLAZID) 34 MG CAPS Take 34 mg by mouth daily.  . [DISCONTINUED] finasteride (PROSCAR) 5 MG tablet Take 1 tablet (5 mg total) by mouth daily.  . [DISCONTINUED] sertraline (ZOLOFT) 50 MG tablet Take 2 tablets (100 mg total) by mouth daily.  . [DISCONTINUED] niacinamide 500 MG tablet  (Patient not taking: Reported on 04/06/2020)  . [DISCONTINUED] Pimavanserin Tartrate (NUPLAZID) 17 MG TABS Take 17 mg by mouth daily.  (Patient not taking: Reported on 04/06/2020)   No facility-administered medications prior to visit.    Review of Systems  Constitutional: Negative.   Respiratory: Negative.   Cardiovascular: Negative.   Genitourinary: Negative.   Neurological: Positive for weakness and numbness.    Last CBC Lab Results  Component Value Date   WBC 5.6 04/03/2020   HGB 11.3 (L) 04/03/2020   HCT 34.5 (L) 04/03/2020   MCV 95.0 04/03/2020   MCH  31.1 04/03/2020   RDW 13.7 04/03/2020   PLT 193 60/63/0160   Last metabolic panel Lab Results  Component Value Date   GLUCOSE 94 04/03/2020   NA 140 04/03/2020   K 3.9 04/03/2020   CL 108 04/03/2020   CO2 25 04/03/2020   BUN 33 (H) 04/03/2020   CREATININE 1.06 04/03/2020   GFRNONAA >60 04/03/2020   GFRAA >60 04/03/2020   CALCIUM 8.5  (L) 04/03/2020   PHOS 2.6 03/31/2020   PROT 6.6 02/17/2020   ALBUMIN 4.5 02/17/2020   LABGLOB 2.1 02/17/2020   AGRATIO 2.1 02/17/2020   BILITOT 0.3 02/17/2020   ALKPHOS 63 02/17/2020   AST 26 02/17/2020   ALT 11 02/17/2020   ANIONGAP 7 04/03/2020      Objective    BP 104/64 (BP Location: Left Arm, Patient Position: Sitting, Cuff Size: Normal)   Pulse 73   Temp 97.6 F (36.4 C) (Temporal)   Resp 16   Wt 165 lb (74.8 kg)   BMI 26.63 kg/m  BP Readings from Last 3 Encounters:  04/10/20 104/64  04/03/20 108/73  01/23/20 (!) 110/54   Wt Readings from Last 3 Encounters:  04/10/20 165 lb (74.8 kg)  03/30/20 165 lb (74.8 kg)  01/23/20 164 lb (74.4 kg)      Physical Exam Vitals reviewed.  Constitutional:      General: He is not in acute distress.    Appearance: Normal appearance. He is well-developed. He is not ill-appearing or diaphoretic.  HENT:     Head: Normocephalic and atraumatic.  Cardiovascular:     Rate and Rhythm: Normal rate and regular rhythm.     Heart sounds: Normal heart sounds. No murmur heard.  No friction rub. No gallop.   Pulmonary:     Effort: Pulmonary effort is normal. No respiratory distress.     Breath sounds: Normal breath sounds. No wheezing or rales.  Musculoskeletal:     Cervical back: Normal range of motion and neck supple.  Neurological:     Mental Status: He is alert. Mental status is at baseline.      No results found for any visits on 04/10/20.  Assessment & Plan     1. Encounter for support and coordination of transition of care Hospital notes, consults, labs and images from hospitalization from dates 6/7-6/11/21. Transition phone call was completed on 04/06/20. All symptoms improved and back to baseline.  2. Mild episode of recurrent major depressive disorder [facility]) Patient feels it is worsening. Wants to try max dose of sertraline at 200mg  before changing therapy. Sertraline increased to 200mg  as below. F/U in 4 weeks with  telephone visit to see how tolerated.  - sertraline (ZOLOFT) 100 MG tablet; Take 2 tablets (200 mg total) by mouth at bedtime.  Dispense: 60 tablet; Refill: 0  3. BPH with urinary obstruction Stable. Diagnosis pulled for medication refill. Continue current medical treatment plan. - finasteride (PROSCAR) 5 MG tablet; Take 1 tablet (5 mg total) by mouth daily.  Dispense: 90 tablet; Refill: 1  4. Transient alteration of awareness Improved to baseline. Has parkinson's dementia so has transient confusion at baseline.   Return in about 4 weeks (around 05/08/2020) for telephone.      Reynolds Bowl, PA-C, have reviewed all documentation for this visit. The documentation on 04/12/20 for the exam, diagnosis, procedures, and orders are all accurate and complete.   Rubye Beach  [facility] [phone] (phone) [phone] (fax)  [place]

## 2020-04-10 NOTE — Patient Instructions (Signed)
Sertraline tablets What is this medicine? SERTRALINE (SER tra leen) is used to treat depression. It may also be used to treat obsessive compulsive disorder, panic disorder, post-trauma stress, premenstrual dysphoric disorder (PMDD) or social anxiety. This medicine may be used for other purposes; ask your health care provider or pharmacist if you have questions. COMMON BRAND NAME(S): Zoloft What should I tell my health care provider before I take this medicine? They need to know if you have any of these conditions:  bleeding disorders  bipolar disorder or a family history of bipolar disorder  glaucoma  heart disease  high blood pressure  history of irregular heartbeat  history of low levels of calcium, magnesium, or potassium in the blood  if you often drink alcohol  liver disease  receiving electroconvulsive therapy  seizures  suicidal thoughts, plans, or attempt; a previous suicide attempt by you or a family member  take medicines that treat or prevent blood clots  thyroid disease  an unusual or allergic reaction to sertraline, other medicines, foods, dyes, or preservatives  pregnant or trying to get pregnant  breast-feeding How should I use this medicine? Take this medicine by mouth with a glass of water. Follow the directions on the prescription label. You can take it with or without food. Take your medicine at regular intervals. Do not take your medicine more often than directed. Do not stop taking this medicine suddenly except upon the advice of your doctor. Stopping this medicine too quickly may cause serious side effects or your condition may worsen. A special MedGuide will be given to you by the pharmacist with each prescription and refill. Be sure to read this information carefully each time. Talk to your pediatrician regarding the use of this medicine in children. While this drug may be prescribed for children as young as 7 years for selected conditions,  precautions do apply. Overdosage: If you think you have taken too much of this medicine contact a poison control center or emergency room at once. NOTE: This medicine is only for you. Do not share this medicine with others. What if I miss a dose? If you miss a dose, take it as soon as you can. If it is almost time for your next dose, take only that dose. Do not take double or extra doses. What may interact with this medicine? Do not take this medicine with any of the following medications:  cisapride  dronedarone  linezolid  MAOIs like Carbex, Eldepryl, Marplan, Nardil, and Parnate  methylene blue (injected into a vein)  pimozide  thioridazine This medicine may also interact with the following medications:  alcohol  amphetamines  aspirin and aspirin-like medicines  certain medicines for depression, anxiety, or psychotic disturbances  certain medicines for fungal infections like ketoconazole, fluconazole, posaconazole, and itraconazole  certain medicines for irregular heart beat like flecainide, quinidine, propafenone  certain medicines for migraine headaches like almotriptan, eletriptan, frovatriptan, naratriptan, rizatriptan, sumatriptan, zolmitriptan  certain medicines for sleep  certain medicines for seizures like carbamazepine, valproic acid, phenytoin  certain medicines that treat or prevent blood clots like warfarin, enoxaparin, dalteparin  cimetidine  digoxin  diuretics  fentanyl  isoniazid  lithium  NSAIDs, medicines for pain and inflammation, like ibuprofen or naproxen  other medicines that prolong the QT interval (cause an abnormal heart rhythm) like dofetilide  rasagiline  safinamide  supplements like St. John's wort, kava kava, valerian  tolbutamide  tramadol  tryptophan This list may not describe all possible interactions. Give your health care provider  a list of all the medicines, herbs, non-prescription drugs, or dietary supplements  you use. Also tell them if you smoke, drink alcohol, or use illegal drugs. Some items may interact with your medicine. What should I watch for while using this medicine? Tell your doctor if your symptoms do not get better or if they get worse. Visit your doctor or health care professional for regular checks on your progress. Because it may take several weeks to see the full effects of this medicine, it is important to continue your treatment as prescribed by your doctor. Patients and their families should watch out for new or worsening thoughts of suicide or depression. Also watch out for sudden changes in feelings such as feeling anxious, agitated, panicky, irritable, hostile, aggressive, impulsive, severely restless, overly excited and hyperactive, or not being able to sleep. If this happens, especially at the beginning of treatment or after a change in dose, call your health care professional. You may get drowsy or dizzy. Do not drive, use machinery, or do anything that needs mental alertness until you know how this medicine affects you. Do not stand or sit up quickly, especially if you are an older patient. This reduces the risk of dizzy or fainting spells. Alcohol may interfere with the effect of this medicine. Avoid alcoholic drinks. Your mouth may get dry. Chewing sugarless gum or sucking hard candy, and drinking plenty of water may help. Contact your doctor if the problem does not go away or is severe. What side effects may I notice from receiving this medicine? Side effects that you should report to your doctor or health care professional as soon as possible:  allergic reactions like skin rash, itching or hives, swelling of the face, lips, or tongue  anxious  black, tarry stools  changes in vision  confusion  elevated mood, decreased need for sleep, racing thoughts, impulsive behavior  eye pain  fast, irregular heartbeat  feeling faint or lightheaded, falls  feeling agitated,  angry, or irritable  hallucination, loss of contact with reality  loss of balance or coordination  loss of memory  painful or prolonged erections  restlessness, pacing, inability to keep still  seizures  stiff muscles  suicidal thoughts or other mood changes  trouble sleeping  unusual bleeding or bruising  unusually weak or tired  vomiting Side effects that usually do not require medical attention (report to your doctor or health care professional if they continue or are bothersome):  change in appetite or weight  change in sex drive or performance  diarrhea  increased sweating  indigestion, nausea  tremors This list may not describe all possible side effects. Call your doctor for medical advice about side effects. You may report side effects to FDA at 1-800-FDA-1088. Where should I keep my medicine? Keep out of the reach of children. Store at room temperature between 15 and 30 degrees C (59 and 86 degrees F). Throw away any unused medicine after the expiration date. NOTE: This sheet is a summary. It may not cover all possible information. If you have questions about this medicine, talk to your doctor, pharmacist, or health care provider.  2020 Elsevier/Gold Standard (2018-10-02 10:09:27)  

## 2020-04-16 ENCOUNTER — Telehealth: Payer: Self-pay

## 2020-04-16 DIAGNOSIS — M25511 Pain in right shoulder: Secondary | ICD-10-CM

## 2020-04-16 NOTE — Telephone Encounter (Signed)
Copied from [place] [id number]. Topic: General - Inquiry >> Apr 15, 2020  4:48 PM Alease Frame wrote: Reason for CRM:Pts wife called in for a referral for shoulder pain . Please advise

## 2020-04-17 NOTE — Telephone Encounter (Signed)
Is it okay to refer?  Thanks,   -Mickel Baas

## 2020-04-17 NOTE — Telephone Encounter (Signed)
I don't see where Tawanna Sat has addressed this before. I would suggest scheduling a visit before referring.

## 2020-04-17 NOTE — Telephone Encounter (Signed)
Advised patient wife as below. Tawanna Sat does not have any avail appts until 7/2. She did not want the patient to wait that long. Scheduled pt an appt with Simona Huh Chrismon 6/29 @ 3pm.

## 2020-04-18 ENCOUNTER — Emergency Department: Payer: Medicare Other

## 2020-04-18 ENCOUNTER — Emergency Department
Admission: EM | Admit: 2020-04-18 | Discharge: 2020-04-18 | Disposition: A | Discharge status: Home / Self Care | Payer: Medicare Other | Attending: Emergency Medicine | Admitting: Emergency Medicine

## 2020-04-18 ENCOUNTER — Other Ambulatory Visit: Payer: Self-pay

## 2020-04-18 ENCOUNTER — Encounter: Payer: Self-pay | Admitting: Intensive Care

## 2020-04-18 DIAGNOSIS — J45909 Unspecified asthma, uncomplicated: Secondary | ICD-10-CM | POA: Diagnosis not present

## 2020-04-18 DIAGNOSIS — N183 Chronic kidney disease, stage 3 unspecified: Secondary | ICD-10-CM | POA: Insufficient documentation

## 2020-04-18 DIAGNOSIS — G20 Parkinson's disease: Secondary | ICD-10-CM | POA: Diagnosis not present

## 2020-04-18 DIAGNOSIS — R410 Disorientation, unspecified: Secondary | ICD-10-CM | POA: Insufficient documentation

## 2020-04-18 DIAGNOSIS — J0100 Acute maxillary sinusitis, unspecified: Secondary | ICD-10-CM

## 2020-04-18 LAB — COMPREHENSIVE METABOLIC PANEL
ALT: 6 U/L (ref 0–44)
AST: 22 U/L (ref 15–41)
Albumin: 4.3 g/dL (ref 3.5–5.0)
Alkaline Phosphatase: 54 U/L (ref 38–126)
Anion gap: 9 (ref 5–15)
BUN: 28 mg/dL — ABNORMAL HIGH (ref 8–23)
CO2: 28 mmol/L (ref 22–32)
Calcium: 8.9 mg/dL (ref 8.9–10.3)
Chloride: 101 mmol/L (ref 98–111)
Creatinine, Ser: 0.98 mg/dL (ref 0.61–1.24)
GFR calc Af Amer: >60 mL/min (ref >60)
GFR calc non Af Amer: >60 mL/min (ref >60)
Glucose, Bld: 87 mg/dL (ref 70–99)
Potassium: 4.2 mmol/L (ref 3.5–5.1)
Sodium: 138 mmol/L (ref 135–145)
Total Bilirubin: 0.9 mg/dL (ref 0.3–1.2)
Total Protein: 6.6 g/dL (ref 6.5–8.1)

## 2020-04-18 LAB — CBC
HCT: 35.9 % — ABNORMAL LOW (ref 39.0–52.0)
Hemoglobin: 11.8 g/dL — ABNORMAL LOW (ref 13.0–17.0)
MCH: 31.4 pg (ref 26.0–34.0)
MCHC: 32.9 g/dL (ref 30.0–36.0)
MCV: 95.5 fL (ref 80.0–100.0)
Platelets: 216 10*3/uL (ref 150–400)
RBC: 3.76 MIL/uL — ABNORMAL LOW (ref 4.22–5.81)
RDW: 14.1 % (ref 11.5–15.5)
WBC: 6.3 10*3/uL (ref 4.0–10.5)
nRBC: 0 % (ref 0.0–0.2)

## 2020-04-18 LAB — URINALYSIS, COMPLETE (UACMP) WITH MICROSCOPIC
Bacteria, UA: NONE SEEN
Bilirubin Urine: NEGATIVE
Glucose, UA: NEGATIVE mg/dL
Hgb urine dipstick: NEGATIVE
Ketones, ur: 5 mg/dL — AB
Nitrite: NEGATIVE
Protein, ur: NEGATIVE mg/dL
Specific Gravity, Urine: 1.027 (ref 1.005–1.030)
pH: 5 (ref 5.0–8.0)

## 2020-04-18 MED ORDER — FLUTICASONE PROPIONATE 50 MCG/ACT NA SUSP: 1.0000 | Freq: Every day | NASAL | 1 refills | Status: DC

## 2020-04-18 NOTE — ED Provider Notes (Signed)
ER Provider Note       Time seen: 6:37 PM    I have reviewed the vital signs and the nursing notes.  HISTORY   Chief Complaint Altered Mental Status   HPI Luke Durham is a 86 y.o. male with a history of anxiety, arthritis, asthma, lymphoma, chronic kidney disease, Parkinson's disease who presents today for confusion.  Patient arrives alert to self and place but disoriented to time and situation.  There have been recent problems with his blood pressure, takes medicine to increase his blood pressure goes typically it is low.  Denies any pain.  Past Medical History:  Diagnosis Date  . Anxiety   . Arthritis   . Asthma    childhood asthma  . Cancer ([place]) 7 years ago   lymphoma, [place] (recent)  . Chronic kidney disease   . Colon polyps   . GERD (gastroesophageal reflux disease)   . History of kidney stones   . Parkinson disease [facility])     Past Surgical History:  Procedure Laterality Date  . COLON SURGERY    . CYSTOSCOPY WITH LITHOLAPAXY N/A 11/14/2016   Procedure: CYSTOSCOPY WITH LITHOLAPAXY;  Surgeon: Hollice Espy, MD;  Location: [facility] ORS;  Service: Urology;  Laterality: N/A;  . EYE SURGERY Bilateral    Cataract Extraction with IOL  . HERNIA REPAIR  20 years  . MOHS SURGERY    . SMALL INTESTINE SURGERY     Per patient 7 years  . TRANSURETHRAL RESECTION OF PROSTATE N/A 11/14/2016   Procedure: TRANSURETHRAL RESECTION OF THE PROSTATE (TURP);  Surgeon: Hollice Espy, MD;  Location: [facility] ORS;  Service: Urology;  Laterality: N/A;    Allergies Patient has no known allergies.  Review of Systems Constitutional: Negative for fever. Cardiovascular: Negative for chest pain. Respiratory: Negative for shortness of breath. Gastrointestinal: Negative for abdominal pain, vomiting and diarrhea. Musculoskeletal: Negative for back pain. Skin: Negative for rash. Neurological: Positive for confusion  All systems negative/normal/unremarkable except as stated in the  HPI  ____________________________________________   PHYSICAL EXAM:  VITAL SIGNS: Vitals:   04/18/20 1628 04/18/20 1818  BP: 119/69 (!) 144/86  Pulse: (!) 58 (!) 57  Resp: 18 16  Temp:    SpO2: 100% 98%    Constitutional: Alert, Well appearing and in no distress. Eyes: Conjunctivae are normal. Normal extraocular movements. ENT      Head: Normocephalic and atraumatic.      Nose: No congestion/rhinnorhea.      Mouth/Throat: Mucous membranes are moist.      Neck: No stridor. Cardiovascular: Normal rate, regular rhythm. No murmurs, rubs, or gallops. Respiratory: Normal respiratory effort without tachypnea nor retractions. Breath sounds are clear and equal bilaterally. No wheezes/rales/rhonchi. Gastrointestinal: Soft and nontender. Normal bowel sounds Musculoskeletal: Nontender with normal range of motion in extremities. No lower extremity tenderness nor edema. Neurologic:  Normal speech and language. No gross focal neurologic deficits are appreciated.  Skin:  Skin is warm, dry and intact. No rash noted. Psychiatric: Speech and behavior are normal.  ____________________________________________  EKG: Interpreted by me.  Sinus bradycardia with first-degree AV block, wide QRS, right bundle branch block, normal QT  ____________________________________________   LABS (pertinent positives/negatives)  Labs Reviewed  COMPREHENSIVE METABOLIC PANEL - Abnormal; Notable for the following components:      Result Value   BUN 28 (*)    All other components within normal limits  CBC - Abnormal; Notable for the following components:   RBC 3.76 (*)    Hemoglobin 11.8 (*)  HCT 35.9 (*)    All other components within normal limits  URINALYSIS, COMPLETE (UACMP) WITH MICROSCOPIC - Abnormal; Notable for the following components:   Color, Urine AMBER (*)    APPearance CLEAR (*)    Ketones, ur 5 (*)    Leukocytes,Ua TRACE (*)    All other components within normal limits   DIFFERENTIAL  DIAGNOSIS  Dehydration, electrolyte abnormality, arrhythmia, MI, occult infection  ASSESSMENT AND PLAN  Confusion   Plan: The patient had presented for increased confusion. Patient's labs were overall unremarkable.  CT of the head did reveal some maxillary sinusitis.  I will place him on Flonase.  Otherwise I will encourage going back to his original Zoloft dosing.  He is cleared for outpatient follow-up.  Lenise Arena MD    Note: This note was generated in part or whole with voice recognition software. Voice recognition is usually quite accurate but there are transcription errors that can and very often do occur. I apologize for any typographical errors that were not detected and corrected.     Earleen Newport, MD 04/18/20 [phone]

## 2020-04-18 NOTE — ED Triage Notes (Addendum)
Wife with patient and reports increased confusion. Patient alert to self and place. Disoriented to time and situation. Wife also reports problems with HTN. Patient takes medicine 3 times a day to increase b/p since it normally stays low.

## 2020-04-18 NOTE — ED Notes (Signed)
Given water while waiting.

## 2020-04-21 ENCOUNTER — Encounter: Payer: Self-pay | Admitting: Family Medicine

## 2020-04-21 ENCOUNTER — Other Ambulatory Visit: Payer: Self-pay

## 2020-04-21 ENCOUNTER — Ambulatory Visit (INDEPENDENT_AMBULATORY_CARE_PROVIDER_SITE_OTHER): Payer: Medicare Other | Admitting: Family Medicine

## 2020-04-21 VITALS — BP 110/70 | HR 67 | Temp 97.3°F | Resp 16 | Wt 167.8 lb

## 2020-04-21 DIAGNOSIS — R0981 Nasal congestion: Secondary | ICD-10-CM

## 2020-04-21 DIAGNOSIS — G20 Parkinson's disease: Secondary | ICD-10-CM | POA: Diagnosis not present

## 2020-04-21 NOTE — Progress Notes (Signed)
Established patient visit   Patient: Luke Durham   DOB: 1933/03/04   86 y.o. Male  MRN: [id number] Visit Date: 04/21/2020  Today's healthcare provider: Vernie Murders, PA   Chief Complaint  Patient presents with  . Sinusitis  . Follow-up   Subjective    HPI Follow up ER visit  Patient was seen in ER for altered mental status on 04/18/2020. He was treated for Confusion, acute maxillary sinusitis. Treatment for this included labs, and imaging. He reports excellent compliance with treatment. He reports this condition is Unchanged. CT scan of head revealed some maxillary sinusitis. Patient started on Flonase, reports good tolerance. Patient was also advised to decrease Zoloft to 100mg  daily.  -----------------------------------------------------------------------------------------    Patient Active Problem List   Diagnosis Date Noted  . Altered mental status 03/30/2020  . Lab test positive for detection of COVID-19 virus 10/02/2019  . Hypotension 10/02/2019  . Dementia due to Parkinson's disease without behavioral disturbance ([place]) 07/18/2017  . Neurogenic orthostatic hypotension ([place]) 12/02/2016  . BPH with urinary obstruction 11/14/2016  . Urinary frequency 10/10/2016  . Microscopic hematuria 10/10/2016  . Parkinson's syndrome ([place]) 06/14/2016  . Arthritis 06/14/2016  . Depression 06/14/2016  . Heartburn 06/14/2016  . Urinary incontinence 06/14/2016  . Skin lesion 06/14/2016  . Bilateral edema of lower extremity 06/14/2016   Past Surgical History:  Procedure Laterality Date  . COLON SURGERY    . CYSTOSCOPY WITH LITHOLAPAXY N/A 11/14/2016   Procedure: CYSTOSCOPY WITH LITHOLAPAXY;  Surgeon: Hollice Espy, MD;  Location: [facility] ORS;  Service: Urology;  Laterality: N/A;  . EYE SURGERY Bilateral    Cataract Extraction with IOL  . HERNIA REPAIR  20 years  . MOHS SURGERY    . SMALL INTESTINE SURGERY     Per patient 7 years  . TRANSURETHRAL RESECTION OF  PROSTATE N/A 11/14/2016   Procedure: TRANSURETHRAL RESECTION OF THE PROSTATE (TURP);  Surgeon: Hollice Espy, MD;  Location: [facility] ORS;  Service: Urology;  Laterality: N/A;   Family History  Problem Relation Age of Onset  . Heart disease Father   . Bladder Cancer Neg Hx   . Prostate cancer Neg Hx   . Kidney cancer Neg Hx    Social History   Tobacco Use  . Smoking status: Never Smoker  . Smokeless tobacco: Never Used  Vaping Use  . Vaping Use: Never used  Substance Use Topics  . Alcohol use: Yes    Alcohol/week: 0.0 - 3.0 standard drinks  . Drug use: No   No Known Allergies  Medications: Outpatient Medications Prior to Visit  Medication Sig  . carbidopa-levodopa (SINEMET IR) 25-250 MG tablet Take 2 tablets by mouth 3 (three) times daily.   . Carboxymethylcellulose Sodium (THERATEARS OP) Place 1-2 drops into both eyes at bedtime.  . cholecalciferol (VITAMIN D) 1000 units tablet Take 1,000 Units by mouth daily.  Marland Kitchen DOK 100 MG capsule TAKE 1 CAPSULE(100 MG) BY MOUTH TWICE DAILY (Patient taking differently: 100 mg 2 (two) times daily. Docusate sodium)  . donepezil (ARICEPT) 10 MG tablet Take 10 mg by mouth at bedtime.   . entacapone (COMTAN) 200 MG tablet Take 200 mg by mouth 3 (three) times daily.  . finasteride (PROSCAR) 5 MG tablet Take 1 tablet (5 mg total) by mouth daily.  . fluticasone (FLONASE) 50 MCG/ACT nasal spray Place 1 spray into both nostrils daily.  . midodrine (PROAMATINE) 5 MG tablet Take 1 tablet (5 mg total) by mouth 3 (three) times daily with  meals.  . Multiple Vitamin (MULTIVITAMIN) capsule Take 1 capsule by mouth daily.  . Multiple Vitamins-Minerals (OCUVITE PO) Take 1 tablet by mouth daily.   Luke Durham (NUPLAZID) 34 MG CAPS Take 34 mg by mouth daily.  . sertraline (ZOLOFT) 100 MG tablet Take 2 tablets (200 mg total) by mouth at bedtime. (Patient taking differently: Take 100 mg by mouth at bedtime. )   No facility-administered medications prior to  visit.    Review of Systems  Constitutional: Negative for appetite change, chills and fever.  HENT: Negative for congestion, ear pain, sinus pressure and sinus pain.   Respiratory: Negative for cough, chest tightness and shortness of breath.   Cardiovascular: Negative for chest pain and palpitations.  Neurological: Negative for headaches.  Psychiatric/Behavioral: Negative for confusion.    Last CBC Lab Results  Component Value Date   WBC 6.3 04/18/2020   HGB 11.8 (L) 04/18/2020   HCT 35.9 (L) 04/18/2020   MCV 95.5 04/18/2020   MCH 31.4 04/18/2020   RDW 14.1 04/18/2020   PLT 216 14/78/2956   Last metabolic panel Lab Results  Component Value Date   GLUCOSE 87 04/18/2020   NA 138 04/18/2020   K 4.2 04/18/2020   CL 101 04/18/2020   CO2 28 04/18/2020   BUN 28 (H) 04/18/2020   CREATININE 0.98 04/18/2020   GFRNONAA >60 04/18/2020   GFRAA >60 04/18/2020   CALCIUM 8.9 04/18/2020   PHOS 2.6 03/31/2020   PROT 6.6 04/18/2020   ALBUMIN 4.3 04/18/2020   LABGLOB 2.1 02/17/2020   AGRATIO 2.1 02/17/2020   BILITOT 0.9 04/18/2020   ALKPHOS 54 04/18/2020   AST 22 04/18/2020   ALT 6 04/18/2020   ANIONGAP 9 04/18/2020   Last thyroid functions Lab Results  Component Value Date   TSH 2.240 02/17/2020      Objective    BP 110/70 (BP Location: Right Arm, Patient Position: Sitting, Cuff Size: Normal)   Pulse 67   Temp (!) 97.3 F (36.3 C) (Temporal)   Resp 16   Wt 167 lb 12.8 oz (76.1 kg)   BMI 27.08 kg/m  BP Readings from Last 3 Encounters:  04/21/20 110/70  04/18/20 (!) 156/83  04/10/20 104/64   Wt Readings from Last 3 Encounters:  04/21/20 167 lb 12.8 oz (76.1 kg)  04/18/20 165 lb (74.8 kg)  04/10/20 165 lb (74.8 kg)   Physical Exam Constitutional:      General: He is not in acute distress.    Appearance: He is well-developed.  HENT:     Head: Normocephalic and atraumatic.     Right Ear: Hearing and tympanic membrane normal.     Left Ear: Hearing and tympanic  membrane normal.     Ears:     Comments: AC>BC to 256 fork. No lateralization.    Nose: Nose normal.  Eyes:     General: Lids are normal. No scleral icterus.       Right eye: No discharge.        Left eye: No discharge.     Conjunctiva/sclera: Conjunctivae normal.  Cardiovascular:     Rate and Rhythm: Normal rate and regular rhythm.     Pulses: Normal pulses.     Heart sounds: Normal heart sounds.  Pulmonary:     Effort: Pulmonary effort is normal. No respiratory distress.  Musculoskeletal:     Cervical back: Neck supple.     Comments: Stiff movements with short shuffling gait. Poor balance supported with Rollator walker.  Skin:    Findings: No lesion or rash.  Neurological:     Mental Status: He is alert and oriented to person, place, and time.  Psychiatric:        Speech: Speech normal.        Behavior: Behavior normal.        Thought Content: Thought content normal.      No results found for any visits on 04/21/20.  Assessment & Plan     1. Sinus congestion CT scan during ER visit for confusion showed some left maxillary sinus opacity. Was given Flonase to use 1 spray each nostril BID. Denies congestion or fever. Has not started the nasal spray yet. No fever or headaches. Hearing is basically intact with normal hearing by tuning fork assessment. Recheck as planned with Dr.Shah on 04-30-20 and here on 05-08-20.  2. Parkinson's syndrome ([place]) History of altered mental status/confusion. No sign of CVA in the ER on 04-18-20. May be some progression of Parkinson's disease. Was hospitalized on 03-30-20 with similar symptoms and had Abilify stopped hoping it was part of the confusion issue. This ER visit, the Sertraline was decreased to 100 mg qd. Still taking Sinemet 25-250 mg 2 tablets TID and continues to have Home Health assistance coming. Followed by neurologist (Dr. Manuella Ghazi)..   No follow-ups on file.      Andres Shad, PA, have reviewed all documentation for this visit. The  documentation on 04/21/20 for the exam, diagnosis, procedures, and orders are all accurate and complete.    Vernie Murders, [place] [phone] (phone) [phone] (fax)  [place]

## 2020-04-22 ENCOUNTER — Other Ambulatory Visit: Payer: Self-pay | Admitting: Physician Assistant

## 2020-04-22 DIAGNOSIS — Z87448 Personal history of other diseases of urinary system: Secondary | ICD-10-CM

## 2020-05-06 ENCOUNTER — Ambulatory Visit: Payer: Self-pay

## 2020-05-06 NOTE — Telephone Encounter (Signed)
Patient's wife called and asked how does she know when the sinus infection is clear. She says he's been using flonase and was not given an antibiotic in the ED or the office. She denies any symptoms. She says she thinks the spray is working, no colored drainage, no blowing of his nose. She asked how much longer should she use it. I advised to continue to use it. I advised I will send her questions to the office for Anderson Malta and someone will call with her recommendation, she verbalized understanding.  Reason for Disposition . [1] Caller has NON-URGENT medicine question about med that PCP prescribed AND [2] triager unable to answer question  Answer Assessment - Initial Assessment Questions 1. NAME of MEDICATION: "What medicine are you calling about?"     Flonase 2. QUESTION: "What is your question?" (e.g., medication refill, side effect)     How much longer should he use flonase 3. PRESCRIBING HCP: "Who prescribed it?" Reason: if prescribed by specialist, call should be referred to that group.     ER doctor 4. SYMPTOMS: "Do you have any symptoms?"     No 5. SEVERITY: If symptoms are present, ask "Are they mild, moderate or severe?"     N/A 6. PREGNANCY:  "Is there any chance that you are pregnant?" "When was your last menstrual period?"     No  Protocols used: MEDICATION QUESTION CALL-A-AH

## 2020-05-06 NOTE — Telephone Encounter (Signed)
I would continue the flonase for about 4-6 weeks then can stop.

## 2020-05-06 NOTE — Telephone Encounter (Signed)
Pt's wife advised.   Thanks,   -Laura  

## 2020-05-08 ENCOUNTER — Ambulatory Visit: Payer: Self-pay | Admitting: Physician Assistant

## 2020-05-09 ENCOUNTER — Other Ambulatory Visit: Payer: Self-pay | Admitting: Physician Assistant

## 2020-05-09 DIAGNOSIS — F330 Major depressive disorder, recurrent, mild: Secondary | ICD-10-CM

## 2020-05-09 NOTE — Telephone Encounter (Signed)
Requested Prescriptions  Pending Prescriptions Disp Refills  . sertraline (ZOLOFT) 100 MG tablet [Pharmacy Med Name: SERTRALINE 100MG  TABLETS] 180 tablet 0    Sig: TAKE 2 TABLETS(200 MG) BY MOUTH AT BEDTIME     Psychiatry:  Antidepressants - SSRI Passed - 05/09/2020 11:33 AM      Passed - Completed PHQ-2 or PHQ-9 in the last 360 days.      Passed - Valid encounter within last 6 months    Recent Outpatient Visits          2 weeks ago Sinus congestion   [place], [place]   4 weeks ago Encounter for support and coordination of transition of care   [place], [place]   3 months ago Chronic fatigue   [place], [place]   7 months ago Hypotension, unspecified hypotension type   [facility] Flinchum, Kelby Aline, FNP   2 years ago Annual physical exam   [facility] Mar Daring, [place]

## 2020-05-15 ENCOUNTER — Telehealth: Payer: Self-pay | Admitting: Adult Health Nurse Practitioner

## 2020-05-15 NOTE — Telephone Encounter (Signed)
Spoke with patient's wife and discussed Palliative services with her and answered all her questions and she has currently declined Palliative at this time, she doesn't feel that they need these services.  But she has agreed to contact MD office if they decided they needed Palliative services in the future.  Referral has been cancelled.

## 2020-05-21 NOTE — Telephone Encounter (Signed)
Referral placed.

## 2020-05-21 NOTE — Telephone Encounter (Signed)
Patient wife is requesting for patient not to come into office due to it is really hard for her to take patient to any appts.  Patient states the shoulder is really bothering him.  Patient is wanting referral to Luke browers MD  [facility].  Patients wife already spoke to office regarding this and the office is aware. Call back [phone]

## 2020-05-21 NOTE — Addendum Note (Signed)
Addended by: Mar Daring on: 05/21/2020 02:11 PM   Modules accepted: Orders

## 2020-06-25 ENCOUNTER — Encounter: Payer: Self-pay | Admitting: Physician Assistant

## 2020-06-25 ENCOUNTER — Ambulatory Visit (INDEPENDENT_AMBULATORY_CARE_PROVIDER_SITE_OTHER): Payer: Medicare Other | Admitting: Physician Assistant

## 2020-06-25 ENCOUNTER — Other Ambulatory Visit: Payer: Self-pay

## 2020-06-25 VITALS — BP 84/48 | HR 73 | Temp 97.7°F | Resp 16 | Wt 165.0 lb

## 2020-06-25 DIAGNOSIS — F419 Anxiety disorder, unspecified: Secondary | ICD-10-CM

## 2020-06-25 DIAGNOSIS — Z0000 Encounter for general adult medical examination without abnormal findings: Secondary | ICD-10-CM

## 2020-06-25 DIAGNOSIS — F0280 Dementia in other diseases classified elsewhere without behavioral disturbance: Secondary | ICD-10-CM

## 2020-06-25 DIAGNOSIS — G20A1 Parkinson's disease without dyskinesia, without mention of fluctuations: Secondary | ICD-10-CM

## 2020-06-25 DIAGNOSIS — G20 Parkinson's disease: Secondary | ICD-10-CM | POA: Diagnosis not present

## 2020-06-25 DIAGNOSIS — G903 Multi-system degeneration of the autonomic nervous system: Secondary | ICD-10-CM | POA: Diagnosis not present

## 2020-06-25 MED ORDER — VENLAFAXINE HCL ER 150 MG PO CP24: 150.0000 mg | ORAL_CAPSULE | Freq: Every day | ORAL | 1 refills | Status: DC

## 2020-06-25 NOTE — Progress Notes (Signed)
Complete physical exam   Patient: Luke Durham   DOB: 01-20-1933   86 y.o. Male  MRN: [id number] Visit Date: 06/25/2020  Today's healthcare provider: Mar Daring, PA-C   Chief Complaint  Patient presents with  . Annual Exam   Subjective    [name] is a 86 y.o. male who presents today for a complete physical exam.  He reports consuming a healthy diet diet. The patient does not participate in regular exercise at present. He generally feels fairly well. He reports sleeping well. He does have additional problems to discuss today. He had a cortisone shot on his R shoulder last week. This is helping thus far.  He reports he is having worsening mood and anxiety. Possibly progression of his Parkinson's, but is interested in changing his medications.    HPI  Had AWV with [facility]  03/18/20  Past Medical History:  Diagnosis Date  . Anxiety   . Arthritis   . Asthma    childhood asthma  . Cancer ([place]) 7 years ago   lymphoma, [place] (recent)  . Chronic kidney disease   . Colon polyps   . GERD (gastroesophageal reflux disease)   . History of kidney stones   . Parkinson disease [facility])    Past Surgical History:  Procedure Laterality Date  . COLON SURGERY    . CYSTOSCOPY WITH LITHOLAPAXY N/A 11/14/2016   Procedure: CYSTOSCOPY WITH LITHOLAPAXY;  Surgeon: Hollice Espy, MD;  Location: [facility] ORS;  Service: Urology;  Laterality: N/A;  . EYE SURGERY Bilateral    Cataract Extraction with IOL  . HERNIA REPAIR  20 years  . MOHS SURGERY    . SMALL INTESTINE SURGERY     Per patient 7 years  . TRANSURETHRAL RESECTION OF PROSTATE N/A 11/14/2016   Procedure: TRANSURETHRAL RESECTION OF THE PROSTATE (TURP);  Surgeon: Hollice Espy, MD;  Location: [facility] ORS;  Service: Urology;  Laterality: N/A;   Social History   Socioeconomic History  . Marital status: Married    Spouse name: Not on file  . Number of children: 2  . Years of education: Not on file  . Highest education level:  Bachelor's degree (e.g., BA, AB, BS)  Occupational History  . Occupation: retired  Tobacco Use  . Smoking status: Never Smoker  . Smokeless tobacco: Never Used  Vaping Use  . Vaping Use: Never used  Substance and Sexual Activity  . Alcohol use: Yes    Alcohol/week: 0.0 - 3.0 standard drinks  . Drug use: No  . Sexual activity: Not on file  Other Topics Concern  . Not on file  Social History Narrative  . Not on file   Social Determinants of Health   Financial Resource Strain: Low Risk   . Difficulty of Paying Living Expenses: Not hard at all  Food Insecurity: No Food Insecurity  . Worried About Charity fundraiser in the Last Year: Never true  . Ran Out of Food in the Last Year: Never true  Transportation Needs: No Transportation Needs  . Lack of Transportation (Medical): No  . Lack of Transportation (Non-Medical): No  Physical Activity: Inactive  . Days of Exercise per Week: 0 days  . Minutes of Exercise per Session: 0 min  Stress: Stress Concern Present  . Feeling of Stress : To some extent  Social Connections: Moderately Isolated  . Frequency of Communication with Friends and Family: More than three times a week  . Frequency of Social Gatherings with Friends and Family:  More than three times a week  . Attends Religious Services: Never  . Active Member of Clubs or Organizations: No  . Attends Archivist Meetings: Never  . Marital Status: Married  Human resources officer Violence: Not At Risk  . Fear of Current or Ex-Partner: No  . Emotionally Abused: No  . Physically Abused: No  . Sexually Abused: No   Family Status  Relation Name Status  . Mother  Deceased  . Father  Deceased  . Neg Hx  (Not Specified)   Family History  Problem Relation Age of Onset  . Heart disease Father   . Bladder Cancer Neg Hx   . Prostate cancer Neg Hx   . Kidney cancer Neg Hx    No Known Allergies  Patient Care Team: Mar Daring, PA-C as PCP - General (Family Medicine)  Vladimir Crofts, MD as Consulting Physician (Neurology) Leandrew Koyanagi, MD as Referring Physician (Ophthalmology) Poggi, Marshall Cork, MD as Consulting Physician (Surgery) Merril Abbe, MD as Referring Physician (Dermatology) Hollice Espy, MD as Consulting Physician (Urology) Dasher, Rayvon Char, MD as Consulting Physician (Dermatology) Gayland Curry Hubbard Hartshorn (Neurology)   Medications: Outpatient Medications Prior to Visit  Medication Sig  . carbidopa-levodopa (SINEMET IR) 25-250 MG tablet Take 2 tablets by mouth 3 (three) times daily.   . Carboxymethylcellulose Sodium (THERATEARS OP) Place 1-2 drops into both eyes at bedtime.  . cholecalciferol (VITAMIN D) 1000 units tablet Take 1,000 Units by mouth daily.  Marland Kitchen donepezil (ARICEPT) 10 MG tablet Take 10 mg by mouth at bedtime.   . entacapone (COMTAN) 200 MG tablet Take 200 mg by mouth 3 (three) times daily.  . finasteride (PROSCAR) 5 MG tablet Take 1 tablet (5 mg total) by mouth daily.  . fluticasone (FLONASE) 50 MCG/ACT nasal spray Place 1 spray into both nostrils daily.  . midodrine (PROAMATINE) 5 MG tablet Take 1 tablet (5 mg total) by mouth 3 (three) times daily with meals.  . Multiple Vitamin (MULTIVITAMIN) capsule Take 1 capsule by mouth daily.  . Multiple Vitamins-Minerals (OCUVITE PO) Take 1 tablet by mouth daily.   Debby Freiberg Tartrate (NUPLAZID) 34 MG CAPS Take 34 mg by mouth daily.  . STOOL SOFTENER 100 MG capsule TAKE 1 CAPSULE(100 MG) BY MOUTH TWICE DAILY  . [DISCONTINUED] sertraline (ZOLOFT) 100 MG tablet TAKE 2 TABLETS(200 MG) BY MOUTH AT BEDTIME   No facility-administered medications prior to visit.    Review of Systems  Constitutional: Negative.   HENT: Positive for drooling, postnasal drip and voice change.   Eyes: Negative.        Dry eye  Respiratory: Positive for wheezing ("post nasal drip").   Cardiovascular: Negative.   Gastrointestinal: Positive for constipation.  Endocrine: Positive for cold  intolerance.  Genitourinary: Positive for enuresis.  Musculoskeletal: Negative.   Skin: Negative.   Allergic/Immunologic: Negative.   Neurological: Negative.   Hematological: Negative.   Psychiatric/Behavioral: Positive for confusion, decreased concentration and hallucinations. The patient is nervous/anxious.       Objective    BP (!) 84/48 (BP Location: Left Arm, Patient Position: Sitting, Cuff Size: Normal) Comment: 84/62  Pulse 73   Temp 97.7 F (36.5 C) (Oral)   Resp 16   Wt 165 lb (74.8 kg)   BMI 26.63 kg/m    Physical Exam Vitals reviewed.  Constitutional:      Appearance: Normal appearance. He is well-developed, well-groomed and overweight.  HENT:     Head: Normocephalic and atraumatic.  Right Ear: Tympanic membrane, ear canal and external ear normal.     Left Ear: Tympanic membrane, ear canal and external ear normal.  Eyes:     General: No scleral icterus.       Right eye: No discharge.        Left eye: No discharge.     Extraocular Movements: Extraocular movements intact.     Conjunctiva/sclera: Conjunctivae normal.     Pupils: Pupils are equal, round, and reactive to light.  Neck:     Thyroid: No thyromegaly.     Vascular: No carotid bruit.     Trachea: No tracheal deviation.  Cardiovascular:     Rate and Rhythm: Normal rate and regular rhythm.     Pulses: Normal pulses.     Heart sounds: Normal heart sounds. No murmur heard.   Pulmonary:     Effort: Pulmonary effort is normal. No respiratory distress.     Breath sounds: Normal breath sounds. No wheezing or rales.  Chest:     Chest wall: No tenderness.  Abdominal:     General: Abdomen is flat. There is no distension.     Palpations: Abdomen is soft. There is no mass.     Tenderness: There is no abdominal tenderness. There is no guarding or rebound.  Musculoskeletal:        General: No tenderness. Normal range of motion.     Cervical back: Normal range of motion and neck supple.     Right lower  leg: No edema.     Left lower leg: No edema.  Lymphadenopathy:     Cervical: No cervical adenopathy.  Skin:    General: Skin is warm and dry.     Findings: No erythema or rash.  Neurological:     Mental Status: He is alert and oriented to person, place, and time.     Motor: No abnormal muscle tone.     Gait: Gait abnormal.     Deep Tendon Reflexes: Reflexes are normal and symmetric.     Comments: Neuro grossly intact for patient; does have Parkinson's  Psychiatric:        Mood and Affect: Mood normal.        Behavior: Behavior normal. Behavior is cooperative.        Thought Content: Thought content normal.        Judgment: Judgment normal.     Last depression screening scores PHQ 2/9 Scores 03/18/2020 03/13/2019 03/08/2018  PHQ - 2 Score 0 2 4  PHQ- 9 Score - 11 14   Last fall risk screening Fall Risk  03/18/2020  Falls in the past year? 1  Number falls in past yr: 1  Injury with Fall? 0  Risk for fall due to : Other (Comment)  Risk for fall due to: Comment Due to Parkinsons  Follow up Falls prevention discussed   Last Audit-C alcohol use screening Alcohol Use Disorder Test (AUDIT) 03/18/2020  1. How often do you have a drink containing alcohol? 2  2. How many drinks containing alcohol do you have on a typical day when you are drinking? 0  3. How often do you have six or more drinks on one occasion? 0  AUDIT-C Score 2  Alcohol Brief Interventions/Follow-up AUDIT Score <7 follow-up not indicated   A score of 3 or more in women, and 4 or more in men indicates increased risk for alcohol abuse, EXCEPT if all of the points are from question 1   No results  found for any visits on 06/25/20.  Assessment & Plan    Routine Health Maintenance and Physical Exam  Exercise Activities and Dietary recommendations Goals    . DIET - INCREASE WATER INTAKE     Recommend increasing water intake to 4-6 glasses a day.     Marland Kitchen LIFESTYLE - DECREASE FALLS RISK     Recommend to remove any items  from the home that may cause slips or trips.       Immunization History  Administered Date(s) Administered  . Fluad Quad(high Dose 65+) 06/27/2019  . Influenza, High Dose Seasonal PF 07/09/2018  . Influenza-Unspecified 07/24/2016  . PFIZER SARS-COV-2 Vaccination 12/08/2019, 01/07/2020  . Pneumococcal Conjugate-13 12/16/2016  . Pneumococcal Polysaccharide-23 01/09/2018  . Td 02/17/2020  . Zoster Recombinat (Shingrix) 07/18/2017, 01/29/2018    Health Maintenance  Topic Date Due  . INFLUENZA VACCINE  05/24/2020  . TETANUS/TDAP  02/16/2030  . COVID-19 Vaccine  Completed  . PNA vac Low Risk Adult  Completed    Discussed health benefits of physical activity, and encouraged him to engage in regular exercise appropriate for his age and condition.  1. Annual physical exam Normal physical exam today. Will check labs as below and f/u pending lab results. If labs are stable and WNL he will not need to have these rechecked for one year at his next annual physical exam. He is to call the office in the meantime if he has any acute issue, questions or concerns. - CBC with Differential/Platelet - Comprehensive metabolic panel - Lipid panel  2. Parkinson's syndrome ([place]) Followed by Neurology. Essentially stable at this time. Will check labs as below and f/u pending results. - CBC with Differential/Platelet - Comprehensive metabolic panel - Lipid panel  3. Neurogenic orthostatic hypotension (HCC) Change positions slowly. Continue daily exercises. Will check labs as below and f/u pending results. - CBC with Differential/Platelet - Comprehensive metabolic panel - Lipid panel  4. Dementia due to Parkinson's disease without behavioral disturbance (HCC) Stable. Followed by Neurology. Continue medications as prescribed by Neurology.  - CBC with Differential/Platelet - Comprehensive metabolic panel - Lipid panel  5. Anxiety Worsening. Stop sertraline. Change to Venlafaxine as below. F/U in  4 weeks, virtual ok.  - venlafaxine XR (EFFEXOR-XR) 150 MG 24 hr capsule; Take 1 capsule (150 mg total) by mouth daily with breakfast.  Dispense: 30 capsule; Refill: 1   Return in about 4 weeks (around 07/23/2020) for virtual ok.     Reynolds Bowl, PA-C, have reviewed all documentation for this visit. The documentation on 07/01/20 for the exam, diagnosis, procedures, and orders are all accurate and complete.   Rubye Beach  [facility] [phone] (phone) [phone] (fax)  [place]

## 2020-06-25 NOTE — Patient Instructions (Signed)
Venlafaxine extended-release capsules What is this medicine? VENLAFAXINE(VEN la fax een) is used to treat depression, anxiety and panic disorder. This medicine may be used for other purposes; ask your health care provider or pharmacist if you have questions. COMMON BRAND NAME(S): Effexor XR What should I tell my health care provider before I take this medicine? They need to know if you have any of these conditions:  bleeding disorders  glaucoma  heart disease  high blood pressure  high cholesterol  kidney disease  liver disease  low levels of sodium in the blood  mania or bipolar disorder  seizures  suicidal thoughts, plans, or attempt; a previous suicide attempt by you or a family  take medicines that treat or prevent blood clots  thyroid disease  an unusual or allergic reaction to venlafaxine, desvenlafaxine, other medicines, foods, dyes, or preservatives  pregnant or trying to get pregnant  breast-feeding How should I use this medicine? Take this medicine by mouth with a full glass of water. Follow the directions on the prescription label. Do not cut, crush, or chew this medicine. Take it with food. If needed, the capsule may be carefully opened and the entire contents sprinkled on a spoonful of cool applesauce. Swallow the applesauce/pellet mixture right away without chewing and follow with a glass of water to ensure complete swallowing of the pellets. Try to take your medicine at about the same time each day. Do not take your medicine more often than directed. Do not stop taking this medicine suddenly except upon the advice of your doctor. Stopping this medicine too quickly may cause serious side effects or your condition may worsen. A special MedGuide will be given to you by the pharmacist with each prescription and refill. Be sure to read this information carefully each time. Talk to your pediatrician regarding the use of this medicine in children. Special care may be  needed. Overdosage: If you think you have taken too much of this medicine contact a poison control center or emergency room at once. NOTE: This medicine is only for you. Do not share this medicine with others. What if I miss a dose? If you miss a dose, take it as soon as you can. If it is almost time for your next dose, take only that dose. Do not take double or extra doses. What may interact with this medicine? Do not take this medicine with any of the following medications:  certain medicines for fungal infections like fluconazole, itraconazole, ketoconazole, posaconazole, voriconazole  cisapride  desvenlafaxine  dronedarone  duloxetine  levomilnacipran  linezolid  MAOIs like Carbex, Eldepryl, Marplan, Nardil, and Parnate  methylene blue (injected into a vein)  milnacipran  pimozide  thioridazine This medicine may also interact with the following medications:  amphetamines  aspirin and aspirin-like medicines  certain medicines for depression, anxiety, or psychotic disturbances  certain medicines for migraine headaches like almotriptan, eletriptan, frovatriptan, naratriptan, rizatriptan, sumatriptan, zolmitriptan  certain medicines for sleep  certain medicines that treat or prevent blood clots like dalteparin, enoxaparin, warfarin  cimetidine  clozapine  diuretics  fentanyl  furazolidone  indinavir  isoniazid  lithium  metoprolol  NSAIDS, medicines for pain and inflammation, like ibuprofen or naproxen  other medicines that prolong the QT interval (cause an abnormal heart rhythm) like dofetilide, ziprasidone  procarbazine  rasagiline  supplements like St. John's wort, kava kava, valerian  tramadol  tryptophan This list may not describe all possible interactions. Give your health care provider a list of all the medicines,   herbs, non-prescription drugs, or dietary supplements you use. Also tell them if you smoke, drink alcohol, or use illegal  drugs. Some items may interact with your medicine. What should I watch for while using this medicine? Tell your doctor if your symptoms do not get better or if they get worse. Visit your doctor or health care professional for regular checks on your progress. Because it may take several weeks to see the full effects of this medicine, it is important to continue your treatment as prescribed by your doctor. Patients and their families should watch out for new or worsening thoughts of suicide or depression. Also watch out for sudden changes in feelings such as feeling anxious, agitated, panicky, irritable, hostile, aggressive, impulsive, severely restless, overly excited and hyperactive, or not being able to sleep. If this happens, especially at the beginning of treatment or after a change in dose, call your health care professional. This medicine can cause an increase in blood pressure. Check with your doctor for instructions on monitoring your blood pressure while taking this medicine. You may get drowsy or dizzy. Do not drive, use machinery, or do anything that needs mental alertness until you know how this medicine affects you. Do not stand or sit up quickly, especially if you are an older patient. This reduces the risk of dizzy or fainting spells. Alcohol may interfere with the effect of this medicine. Avoid alcoholic drinks. Your mouth may get dry. Chewing sugarless gum, sucking hard candy and drinking plenty of water will help. Contact your doctor if the problem does not go away or is severe. What side effects may I notice from receiving this medicine? Side effects that you should report to your doctor or health care professional as soon as possible:  allergic reactions like skin rash, itching or hives, swelling of the face, lips, or tongue  anxious  breathing problems  confusion  changes in vision  chest pain  confusion  elevated mood, decreased need for sleep, racing thoughts, impulsive  behavior  eye pain  fast, irregular heartbeat  feeling faint or lightheaded, falls  feeling agitated, angry, or irritable  hallucination, loss of contact with reality  high blood pressure  loss of balance or coordination  palpitations  redness, blistering, peeling or loosening of the skin, including inside the mouth  restlessness, pacing, inability to keep still  seizures  stiff muscles  suicidal thoughts or other mood changes  trouble passing urine or change in the amount of urine  trouble sleeping  unusual bleeding or bruising  unusually weak or tired  vomiting Side effects that usually do not require medical attention (report to your doctor or health care professional if they continue or are bothersome):  change in sex drive or performance  change in appetite or weight  constipation  dizziness  dry mouth  headache  increased sweating  nausea  tired This list may not describe all possible side effects. Call your doctor for medical advice about side effects. You may report side effects to FDA at 1-800-FDA-1088. Where should I keep my medicine? Keep out of the reach of children. Store at a controlled temperature between 20 and 25 degrees C (68 degrees and 77 degrees F), in a dry place. Throw away any unused medicine after the expiration date. NOTE: This sheet is a summary. It may not cover all possible information. If you have questions about this medicine, talk to your doctor, pharmacist, or health care provider.  2020 Elsevier/Gold Standard (2018-10-02 12:06:43)  

## 2020-07-01 ENCOUNTER — Encounter: Payer: Self-pay | Admitting: Physician Assistant

## 2020-07-03 ENCOUNTER — Ambulatory Visit: Payer: Self-pay

## 2020-07-03 ENCOUNTER — Telehealth: Payer: Self-pay | Admitting: Physician Assistant

## 2020-07-03 ENCOUNTER — Telehealth: Payer: Self-pay

## 2020-07-03 DIAGNOSIS — R319 Hematuria, unspecified: Secondary | ICD-10-CM

## 2020-07-03 DIAGNOSIS — R3989 Other symptoms and signs involving the genitourinary system: Secondary | ICD-10-CM

## 2020-07-03 LAB — POCT URINALYSIS DIPSTICK
Bilirubin, UA: NEGATIVE
Glucose, UA: NEGATIVE
Ketones, UA: NEGATIVE
Leukocytes, UA: NEGATIVE
Nitrite, UA: NEGATIVE
Protein, UA: NEGATIVE
Spec Grav, UA: 1.02 (ref 1.010–1.025)
Urobilinogen, UA: 0.2 E.U./dL
pH, UA: 6.5 (ref 5.0–8.0)

## 2020-07-03 MED ORDER — CEPHALEXIN 500 MG PO CAPS
500.0000 mg | ORAL_CAPSULE | Freq: Two times a day (BID) | ORAL | 0 refills | Status: DC
Start: 2020-07-03 — End: 2021-02-06

## 2020-07-03 NOTE — Telephone Encounter (Signed)
Let me know if we need to send for UCx please

## 2020-07-03 NOTE — Telephone Encounter (Signed)
Baldo Ash with [facility] calling to report pt.'s wife is on her way to the practice to see if a urine sample can be taken from pt. He has altered mental status and blood in urine. Wife knows that the practice is currently closed for lunch.

## 2020-07-03 NOTE — Telephone Encounter (Signed)
Please see other telephone encounter.

## 2020-07-03 NOTE — Telephone Encounter (Signed)
Patient's wife advised as directed below. She asked if an antibiotic can be send in?

## 2020-07-03 NOTE — Telephone Encounter (Signed)
Will send in Keflex for possible infection since he is having mental confusion

## 2020-07-03 NOTE — Telephone Encounter (Signed)
-----   Message from Mar Daring, [place] sent at 07/03/2020  2:44 PM EDT ----- Urine does not appear to have infection. There is a large amount of blood noted so he may be having a kidney stone. Not sure if there is history of that  I can send for culture to see if anything grows out.

## 2020-07-03 NOTE — Addendum Note (Signed)
Addended by: Doristine Devoid on: 07/03/2020 05:09 PM   Modules accepted: Orders

## 2020-07-03 NOTE — Telephone Encounter (Signed)
Please call pt wife gillan Twilley at [phone]. Pt wife would like to come to office to pick up  container to collect urine sample. Pt has blood in urine and altered mental status.. Pt wife would like to pick up contain before 2 pm today. Pt wife will not have anyone to watch him after 2 pm today. Baldo Ash NP is aware the office is closed for lunch

## 2020-07-05 LAB — URINE CULTURE

## 2020-07-06 ENCOUNTER — Telehealth: Payer: Self-pay

## 2020-07-06 NOTE — Telephone Encounter (Signed)
Pt advised.   Thanks,   -Marianela Mandrell  

## 2020-07-06 NOTE — Telephone Encounter (Signed)
-----   Message from Mar Daring, PA-C sent at 07/06/2020  9:46 AM EDT ----- Urine culture is negative for infection.

## 2020-07-16 ENCOUNTER — Ambulatory Visit: Payer: Self-pay

## 2020-08-05 ENCOUNTER — Ambulatory Visit (INDEPENDENT_AMBULATORY_CARE_PROVIDER_SITE_OTHER): Payer: Medicare Other

## 2020-08-05 ENCOUNTER — Other Ambulatory Visit: Payer: Self-pay

## 2020-08-05 DIAGNOSIS — Z23 Encounter for immunization: Secondary | ICD-10-CM | POA: Diagnosis not present

## 2020-08-06 LAB — COMPREHENSIVE METABOLIC PANEL
ALT: 10 IU/L (ref 0–44)
AST: 18 IU/L (ref 0–40)
Albumin/Globulin Ratio: 1.9 (ref 1.2–2.2)
Albumin: 4.2 g/dL (ref 3.6–4.6)
Alkaline Phosphatase: 57 IU/L (ref 44–121)
BUN/Creatinine Ratio: 36 — ABNORMAL HIGH (ref 10–24)
BUN: 32 mg/dL — ABNORMAL HIGH (ref 8–27)
Bilirubin Total: 0.3 mg/dL (ref 0.0–1.2)
CO2: 27 mmol/L (ref 20–29)
Calcium: 8.8 mg/dL (ref 8.6–10.2)
Chloride: 102 mmol/L (ref 96–106)
Creatinine, Ser: 0.9 mg/dL (ref 0.76–1.27)
GFR calc Af Amer: 90 mL/min/{1.73_m2} (ref >59)
GFR calc non Af Amer: 78 mL/min/{1.73_m2} (ref >59)
Globulin, Total: 2.2 g/dL (ref 1.5–4.5)
Glucose: 96 mg/dL (ref 65–99)
Potassium: 4.1 mmol/L (ref 3.5–5.2)
Sodium: 139 mmol/L (ref 134–144)
Total Protein: 6.4 g/dL (ref 6.0–8.5)

## 2020-08-06 LAB — CBC WITH DIFFERENTIAL/PLATELET
Basophils Absolute: 0 10*3/uL (ref 0.0–0.2)
Basos: 0 %
EOS (ABSOLUTE): 0.1 10*3/uL (ref 0.0–0.4)
Eos: 2 %
Hematocrit: 32.4 % — ABNORMAL LOW (ref 37.5–51.0)
Hemoglobin: 11.2 g/dL — ABNORMAL LOW (ref 13.0–17.7)
Immature Grans (Abs): 0 10*3/uL (ref 0.0–0.1)
Immature Granulocytes: 0 %
Lymphocytes Absolute: 0.8 10*3/uL (ref 0.7–3.1)
Lymphs: 17 %
MCH: 31.8 pg (ref 26.6–33.0)
MCHC: 34.6 g/dL (ref 31.5–35.7)
MCV: 92 fL (ref 79–97)
Monocytes Absolute: 0.5 10*3/uL (ref 0.1–0.9)
Monocytes: 10 %
Neutrophils Absolute: 3.4 10*3/uL (ref 1.4–7.0)
Neutrophils: 71 %
Platelets: 204 10*3/uL (ref 150–450)
RBC: 3.52 x10E6/uL — ABNORMAL LOW (ref 4.14–5.80)
RDW: 13.7 % (ref 11.6–15.4)
WBC: 4.8 10*3/uL (ref 3.4–10.8)

## 2020-08-06 LAB — LIPID PANEL
Chol/HDL Ratio: 4.2 ratio (ref 0.0–5.0)
Cholesterol, Total: 164 mg/dL (ref 100–199)
HDL: 39 mg/dL — ABNORMAL LOW (ref >39)
LDL Chol Calc (NIH): 92 mg/dL (ref 0–99)
Triglycerides: 195 mg/dL — ABNORMAL HIGH (ref 0–149)
VLDL Cholesterol Cal: 33 mg/dL (ref 5–40)

## 2020-08-07 ENCOUNTER — Other Ambulatory Visit: Payer: Self-pay | Admitting: Physician Assistant

## 2020-08-07 ENCOUNTER — Encounter: Payer: Self-pay | Admitting: Physician Assistant

## 2020-08-07 ENCOUNTER — Telehealth (INDEPENDENT_AMBULATORY_CARE_PROVIDER_SITE_OTHER): Payer: Medicare Other | Admitting: Physician Assistant

## 2020-08-07 VITALS — Ht 66.0 in | Wt 165.0 lb

## 2020-08-07 DIAGNOSIS — F330 Major depressive disorder, recurrent, mild: Secondary | ICD-10-CM

## 2020-08-07 MED ORDER — SERTRALINE HCL 100 MG PO TABS
100.0000 mg | ORAL_TABLET | Freq: Every day | ORAL | 3 refills | Status: DC
Start: 2020-08-07 — End: 2021-02-08

## 2020-08-07 NOTE — Progress Notes (Signed)
error 

## 2020-08-07 NOTE — Progress Notes (Signed)
Virtual telephone visit    Virtual Visit via Telephone Note   This visit type was conducted due to national recommendations for restrictions regarding the COVID-19 Pandemic (e.g. social distancing) in an effort to limit this patient's exposure and mitigate transmission in our community. Due to his co-morbid illnesses, this patient is at least at moderate risk for complications without adequate follow up. This format is felt to be most appropriate for this patient at this time. The patient did not have access to video technology or had technical difficulties with video requiring transitioning to audio format only (telephone). Physical exam was limited to content and character of the telephone converstion.    Patient location: Home Provider location: [facility]  I discussed the limitations of evaluation and management by telemedicine and the availability of in person appointments. The patient expressed understanding and agreed to proceed.   Visit Date: 08/07/2020  Today's healthcare provider: Mar Daring, PA-C   No chief complaint on file.  Subjective    HPI  Luke Durham is an 86 yr old male that is following up changing sertraline to venlafaxine. Had side effects with venlafaxine, felt like he had a UTI all the time. He is now taking sertraline 100mg  daily and feels this is working.     Patient Active Problem List   Diagnosis Date Noted  . Altered mental status 03/30/2020  . Lab test positive for detection of COVID-19 virus 10/02/2019  . Hypotension 10/02/2019  . Dementia due to Parkinson's disease without behavioral disturbance ([place]) 07/18/2017  . Neurogenic orthostatic hypotension ([place]) 12/02/2016  . BPH with urinary obstruction 11/14/2016  . Urinary frequency 10/10/2016  . Microscopic hematuria 10/10/2016  . Parkinson's syndrome ([place]) 06/14/2016  . Arthritis 06/14/2016  . Depression 06/14/2016  . Heartburn 06/14/2016  . Urinary incontinence 06/14/2016  . Skin  lesion 06/14/2016  . Bilateral edema of lower extremity 06/14/2016   Past Medical History:  Diagnosis Date  . Anxiety   . Arthritis   . Asthma    childhood asthma  . Cancer ([place]) 7 years ago   lymphoma, [place] (recent)  . Chronic kidney disease   . Colon polyps   . GERD (gastroesophageal reflux disease)   . History of kidney stones   . Parkinson disease ([place])       Medications: Outpatient Medications Prior to Visit  Medication Sig  . carbidopa-levodopa (SINEMET IR) 25-250 MG tablet Take 2 tablets by mouth 3 (three) times daily.   . Carboxymethylcellulose Sodium (THERATEARS OP) Place 1-2 drops into both eyes at bedtime.  . cephALEXin (KEFLEX) 500 MG capsule Take 1 capsule (500 mg total) by mouth 2 (two) times daily.  . cholecalciferol (VITAMIN D) 1000 units tablet Take 1,000 Units by mouth daily.  Marland Kitchen donepezil (ARICEPT) 10 MG tablet Take 10 mg by mouth at bedtime.   . entacapone (COMTAN) 200 MG tablet Take 200 mg by mouth 3 (three) times daily.  . finasteride (PROSCAR) 5 MG tablet Take 1 tablet (5 mg total) by mouth daily.  . fluticasone (FLONASE) 50 MCG/ACT nasal spray Place 1 spray into both nostrils daily.  . midodrine (PROAMATINE) 5 MG tablet Take 1 tablet (5 mg total) by mouth 3 (three) times daily with meals.  . Multiple Vitamin (MULTIVITAMIN) capsule Take 1 capsule by mouth daily.  . Multiple Vitamins-Minerals (OCUVITE PO) Take 1 tablet by mouth daily.   Debby Freiberg Tartrate (NUPLAZID) 34 MG CAPS Take 34 mg by mouth daily.  . STOOL SOFTENER 100 MG capsule  TAKE 1 CAPSULE(100 MG) BY MOUTH TWICE DAILY  . venlafaxine XR (EFFEXOR-XR) 150 MG 24 hr capsule Take 1 capsule (150 mg total) by mouth daily with breakfast.   No facility-administered medications prior to visit.    Review of Systems  Constitutional: Negative.   Respiratory: Negative.   Cardiovascular: Negative.   Psychiatric/Behavioral: Negative.     Last CBC Lab Results  Component Value Date   WBC 4.8  08/05/2020   HGB 11.2 (L) 08/05/2020   HCT 32.4 (L) 08/05/2020   MCV 92 08/05/2020   MCH 31.8 08/05/2020   RDW 13.7 08/05/2020   PLT 204 25/00/3704   Last metabolic panel Lab Results  Component Value Date   GLUCOSE 96 08/05/2020   NA 139 08/05/2020   K 4.1 08/05/2020   CL 102 08/05/2020   CO2 27 08/05/2020   BUN 32 (H) 08/05/2020   CREATININE 0.90 08/05/2020   GFRNONAA 78 08/05/2020   GFRAA 90 08/05/2020   CALCIUM 8.8 08/05/2020   PHOS 2.6 03/31/2020   PROT 6.4 08/05/2020   ALBUMIN 4.2 08/05/2020   LABGLOB 2.2 08/05/2020   AGRATIO 1.9 08/05/2020   BILITOT 0.3 08/05/2020   ALKPHOS 57 08/05/2020   AST 18 08/05/2020   ALT 10 08/05/2020   ANIONGAP 9 04/18/2020      Objective    There were no vitals taken for this visit. BP Readings from Last 3 Encounters:  06/25/20 (!) 84/48  04/21/20 110/70  04/18/20 (!) 156/83   Wt Readings from Last 3 Encounters:  08/07/20 165 lb (74.8 kg)  06/25/20 165 lb (74.8 kg)  04/21/20 167 lb 12.8 oz (76.1 kg)        Assessment & Plan     1. Mild episode of recurrent major depressive disorder (HCC) Stopped venlafaxine. Switched back to Sertraline 100mg  daily. Doing well.    No follow-ups on file.    I discussed the assessment and treatment plan with the patient. The patient was provided an opportunity to ask questions and all were answered. The patient agreed with the plan and demonstrated an understanding of the instructions.   The patient was advised to call back or seek an in-person evaluation if the symptoms worsen or if the condition fails to improve as anticipated.  I provided 14  minutes of non-face-to-face time during this encounter.  Reynolds Bowl, PA-C, have reviewed all documentation for this visit. The documentation on 08/07/20 for the exam, diagnosis, procedures, and orders are all accurate and complete.  [facility] [phone] (phone) [phone]  (fax)  [place]

## 2020-08-15 ENCOUNTER — Other Ambulatory Visit: Payer: Self-pay | Admitting: Physician Assistant

## 2020-09-04 ENCOUNTER — Ambulatory Visit: Payer: Medicare Other | Attending: Internal Medicine

## 2020-09-04 DIAGNOSIS — Z23 Encounter for immunization: Secondary | ICD-10-CM

## 2020-09-04 NOTE — Progress Notes (Signed)
   Covid-19 Vaccination Clinic  Name:  Luke Durham    MRN: [id number] DOB: [birth year]  09/04/2020  [name] was observed post Covid-19 immunization for 15 minutes without incident. He was provided with Vaccine Information Sheet and instruction to access the V-Safe system.   [name] was instructed to call 911 with any severe reactions post vaccine: Marland Kitchen Difficulty breathing  . Swelling of face and throat  . A fast heartbeat  . A bad rash all over body  . Dizziness and weakness

## 2020-10-27 ENCOUNTER — Telehealth: Payer: Self-pay

## 2020-10-27 NOTE — Telephone Encounter (Signed)
Copied from CRM [id number]. Topic: General - Other >> Oct 26, 2020  1:06 PM Dalphine Handing A wrote:  Patient spouse stated that patient was advised during last appointment that he would begin taking venlafaxine medication, however patient never got the prescription for medication. Patient would like the prescription sent to [id number] [place], [place] - [address] AT [facility] OF [address]  Phone:  [phone] Fax:  [phone]

## 2020-10-27 NOTE — Telephone Encounter (Signed)
Venlafaxine sent in. Stop sertraline

## 2020-10-28 NOTE — Telephone Encounter (Signed)
Mrs. Crickenberger advised.   Thanks,   -Vernona Rieger

## 2021-02-05 ENCOUNTER — Telehealth: Payer: Self-pay | Admitting: Physician Assistant

## 2021-02-05 NOTE — Telephone Encounter (Signed)
Called to inform the doctor that the patient is being referred to Hospice care because of rapid decline in health.  If there are any questions, please call [phone]

## 2021-02-06 ENCOUNTER — Other Ambulatory Visit: Payer: Self-pay

## 2021-02-06 ENCOUNTER — Observation Stay
Admission: EM | Admit: 2021-02-06 | Discharge: 2021-02-08 | Disposition: A | Discharge status: Home / Self Care | Payer: Medicare Other | Attending: Internal Medicine | Admitting: Internal Medicine

## 2021-02-06 ENCOUNTER — Emergency Department: Payer: Medicare Other

## 2021-02-06 DIAGNOSIS — Z20822 Contact with and (suspected) exposure to covid-19: Secondary | ICD-10-CM | POA: Insufficient documentation

## 2021-02-06 DIAGNOSIS — R531 Weakness: Secondary | ICD-10-CM | POA: Diagnosis not present

## 2021-02-06 DIAGNOSIS — G20 Parkinson's disease: Secondary | ICD-10-CM | POA: Insufficient documentation

## 2021-02-06 DIAGNOSIS — Z79899 Other long term (current) drug therapy: Secondary | ICD-10-CM | POA: Insufficient documentation

## 2021-02-06 DIAGNOSIS — Z7189 Other specified counseling: Secondary | ICD-10-CM

## 2021-02-06 DIAGNOSIS — N400 Enlarged prostate without lower urinary tract symptoms: Secondary | ICD-10-CM | POA: Diagnosis present

## 2021-02-06 DIAGNOSIS — N189 Chronic kidney disease, unspecified: Secondary | ICD-10-CM | POA: Insufficient documentation

## 2021-02-06 DIAGNOSIS — J45909 Unspecified asthma, uncomplicated: Secondary | ICD-10-CM | POA: Insufficient documentation

## 2021-02-06 DIAGNOSIS — R4182 Altered mental status, unspecified: Secondary | ICD-10-CM | POA: Diagnosis not present

## 2021-02-06 DIAGNOSIS — I951 Orthostatic hypotension: Secondary | ICD-10-CM | POA: Insufficient documentation

## 2021-02-06 DIAGNOSIS — Z8579 Personal history of other malignant neoplasms of lymphoid, hematopoietic and related tissues: Secondary | ICD-10-CM | POA: Diagnosis not present

## 2021-02-06 DIAGNOSIS — F0280 Dementia in other diseases classified elsewhere without behavioral disturbance: Secondary | ICD-10-CM | POA: Diagnosis not present

## 2021-02-06 DIAGNOSIS — Z66 Do not resuscitate: Secondary | ICD-10-CM | POA: Insufficient documentation

## 2021-02-06 DIAGNOSIS — G903 Multi-system degeneration of the autonomic nervous system: Secondary | ICD-10-CM | POA: Diagnosis present

## 2021-02-06 LAB — COMPREHENSIVE METABOLIC PANEL
ALT: <5 U/L (ref 0–44)
AST: 38 U/L (ref 15–41)
Albumin: 3.9 g/dL (ref 3.5–5.0)
Alkaline Phosphatase: 42 U/L (ref 38–126)
Anion gap: 8 (ref 5–15)
BUN: 22 mg/dL (ref 8–23)
CO2: 25 mmol/L (ref 22–32)
Calcium: 8.4 mg/dL — ABNORMAL LOW (ref 8.9–10.3)
Chloride: 104 mmol/L (ref 98–111)
Creatinine, Ser: 0.85 mg/dL (ref 0.61–1.24)
GFR, Estimated: >60 mL/min (ref >60)
Glucose, Bld: 84 mg/dL (ref 70–99)
Potassium: 4.5 mmol/L (ref 3.5–5.1)
Sodium: 137 mmol/L (ref 135–145)
Total Bilirubin: 1.1 mg/dL (ref 0.3–1.2)
Total Protein: 6.4 g/dL — ABNORMAL LOW (ref 6.5–8.1)

## 2021-02-06 LAB — URINALYSIS, COMPLETE (UACMP) WITH MICROSCOPIC
Bacteria, UA: NONE SEEN
Bilirubin Urine: NEGATIVE
Glucose, UA: NEGATIVE mg/dL
Hgb urine dipstick: NEGATIVE
Ketones, ur: 20 mg/dL — AB
Leukocytes,Ua: NEGATIVE
Nitrite: NEGATIVE
Protein, ur: NEGATIVE mg/dL
Specific Gravity, Urine: 1.025 (ref 1.005–1.030)
pH: 5 (ref 5.0–8.0)

## 2021-02-06 LAB — CBC WITH DIFFERENTIAL/PLATELET
Abs Immature Granulocytes: 0.01 10*3/uL (ref 0.00–0.07)
Basophils Absolute: 0 10*3/uL (ref 0.0–0.1)
Basophils Relative: 0 %
Eosinophils Absolute: 0.1 10*3/uL (ref 0.0–0.5)
Eosinophils Relative: 2 %
HCT: 36.1 % — ABNORMAL LOW (ref 39.0–52.0)
Hemoglobin: 12.1 g/dL — ABNORMAL LOW (ref 13.0–17.0)
Immature Granulocytes: 0 %
Lymphocytes Relative: 19 %
Lymphs Abs: 0.9 10*3/uL (ref 0.7–4.0)
MCH: 31.6 pg (ref 26.0–34.0)
MCHC: 33.5 g/dL (ref 30.0–36.0)
MCV: 94.3 fL (ref 80.0–100.0)
Monocytes Absolute: 0.4 10*3/uL (ref 0.1–1.0)
Monocytes Relative: 9 %
Neutro Abs: 3.2 10*3/uL (ref 1.7–7.7)
Neutrophils Relative %: 70 %
Platelets: 214 10*3/uL (ref 150–400)
RBC: 3.83 MIL/uL — ABNORMAL LOW (ref 4.22–5.81)
RDW: 13.6 % (ref 11.5–15.5)
WBC: 4.6 10*3/uL (ref 4.0–10.5)
nRBC: 0 % (ref 0.0–0.2)

## 2021-02-06 LAB — LACTIC ACID, PLASMA: Lactic Acid, Venous: 0.9 mmol/L (ref 0.5–1.9)

## 2021-02-06 LAB — ETHANOL: Alcohol, Ethyl (B): <10 mg/dL (ref <10)

## 2021-02-06 LAB — TROPONIN I (HIGH SENSITIVITY): Troponin I (High Sensitivity): 7 ng/L (ref <18)

## 2021-02-06 LAB — LIPASE, BLOOD: Lipase: 32 U/L (ref 11–51)

## 2021-02-06 LAB — BRAIN NATRIURETIC PEPTIDE: B Natriuretic Peptide: 111.5 pg/mL — ABNORMAL HIGH (ref 0.0–100.0)

## 2021-02-06 MED ORDER — SODIUM CHLORIDE 0.9 % IV BOLUS
1000.0000 mL | Freq: Once | INTRAVENOUS | Status: AC
  Administered 2021-02-06: 1000 mL via INTRAVENOUS

## 2021-02-06 NOTE — ED Triage Notes (Signed)
Pt BIBA from home for AMS. Pts family states pts mental status has been declining since Monday. Hx of stage 5 parkinsons. baseline verbal, walks with walker. Pt has a fall last Saturday. Pt is nonverbal on arrival.

## 2021-02-06 NOTE — ED Notes (Signed)
Patient is resting comfortably. Call light within reach. Fall precautions in place.

## 2021-02-06 NOTE — ED Notes (Signed)
Patient is resting comfortably. Call light in reach. Fall precautions in place. Patients son at bedside.

## 2021-02-06 NOTE — ED Notes (Signed)
ED Provider at bedside. 

## 2021-02-06 NOTE — ED Provider Notes (Signed)
[facility] Emergency Department Provider Note   ____________________________________________   Event Date/Time   First MD Initiated Contact with Patient 02/06/21 1229     (approximate)  I have reviewed the triage vital signs and the nursing notes.   HISTORY  Chief Complaint Altered Mental Status    HPI Luke Durham is a 87 y.o. male with a past medical history of Parkinson's disease who presents for home via his wife with concerns of worsening altered mental status over the last 3-4 days.  Patient's wife at bedside provides history that he has since "stage V" Parkinson's however normally is verbal at baseline and walks with a walker.  Patient is also continent of urine at baseline.  Wife says patient is becoming more and more nonverbal, is unable to walk with a walker, and is incontinent of urine over these past 3 days.  Patient is unable to participate in history and review of systems         Past Medical History:  Diagnosis Date  . Anxiety   . Arthritis   . Asthma    childhood asthma  . Cancer ([place]) 7 years ago   lymphoma, [place] (recent)  . Chronic kidney disease   . Colon polyps   . GERD (gastroesophageal reflux disease)   . History of kidney stones   . Parkinson disease [facility])     Patient Active Problem List   Diagnosis Date Noted  . Generalized weakness 02/07/2021  . Altered mental status 03/30/2020  . Lab test positive for detection of COVID-19 virus 10/02/2019  . Hypotension 10/02/2019  . Dementia due to Parkinson's disease without behavioral disturbance ([place]) 07/18/2017  . Neurogenic orthostatic hypotension ([place]) 12/02/2016  . BPH with urinary obstruction 11/14/2016  . Urinary frequency 10/10/2016  . Microscopic hematuria 10/10/2016  . Parkinson's syndrome ([place]) 06/14/2016  . Arthritis 06/14/2016  . Depression 06/14/2016  . Heartburn 06/14/2016  . Urinary incontinence 06/14/2016  . Skin lesion 06/14/2016  . Bilateral edema  of lower extremity 06/14/2016    Past Surgical History:  Procedure Laterality Date  . COLON SURGERY    . CYSTOSCOPY WITH LITHOLAPAXY N/A 11/14/2016   Procedure: CYSTOSCOPY WITH LITHOLAPAXY;  Surgeon: Hollice Espy, MD;  Location: [facility] ORS;  Service: Urology;  Laterality: N/A;  . EYE SURGERY Bilateral    Cataract Extraction with IOL  . HERNIA REPAIR  20 years  . MOHS SURGERY    . SMALL INTESTINE SURGERY     Per patient 7 years  . TRANSURETHRAL RESECTION OF PROSTATE N/A 11/14/2016   Procedure: TRANSURETHRAL RESECTION OF THE PROSTATE (TURP);  Surgeon: Hollice Espy, MD;  Location: [facility] ORS;  Service: Urology;  Laterality: N/A;    Prior to Admission medications   Medication Sig Start Date End Date Taking? Authorizing Provider  carbidopa-levodopa (SINEMET CR) 50-200 MG tablet Take 1 tablet by mouth 2 (two) times daily.   Yes [provider]  carbidopa-levodopa (SINEMET IR) 25-250 MG tablet Take 2 tablets by mouth 4 (four) times daily.   Yes [provider]  cholecalciferol (VITAMIN D) 1000 units tablet Take 1,000 Units by mouth daily.   Yes [provider]  donepezil (ARICEPT ODT) 10 MG disintegrating tablet Take 10 mg by mouth at bedtime.    Yes [provider]  econazole nitrate 1 % cream Apply 1 application topically at bedtime. 07/21/20  Yes [provider]  entacapone (COMTAN) 200 MG tablet Take 200 mg by mouth 3 (three) times daily. 03/24/20  Yes  [provider]  finasteride (PROSCAR) 5 MG tablet Take 1 tablet (5 mg total) by mouth daily. 04/10/20  Yes Fenton Malling M, PA-C  fluticasone (FLONASE) 50 MCG/ACT nasal spray Place 1 spray into both nostrils daily. 04/18/20  Yes Earleen Newport, MD  midodrine (PROAMATINE) 5 MG tablet Take 1 tablet (5 mg total) by mouth 3 (three) times daily with meals. 06/27/19  Yes Mar Daring, PA-C  Multiple Vitamin (MULTIVITAMIN) capsule Take 1 capsule by mouth daily.   Yes [provider]  Multiple Vitamins-Minerals (OCUVITE PO) Take 1 tablet by mouth daily.    Yes [provider]  Pimavanserin Tartrate 34 MG CAPS Take 34 mg by mouth daily.   Yes [provider]  rivastigmine (EXELON) 4.6 mg/24hr Place 4.6 mg onto the skin daily.   Yes [provider]  sertraline (ZOLOFT) 100 MG tablet Take 1 tablet (100 mg total) by mouth daily. 08/07/20  Yes Burnette, Jennifer M, PA-C  STOOL SOFTENER 100 MG capsule TAKE 1 CAPSULE(100 MG) BY MOUTH TWICE DAILY Patient taking differently: Take 100 mg by mouth 2 (two) times daily. 04/22/20  Yes Mar Daring, PA-C  acetaminophen (TYLENOL) 325 MG tablet Take 2 tablets (650 mg total) by mouth every 6 (six) hours as needed for mild pain (or Fever >/= 101). 02/08/21   Nolberto Hanlon, MD  acetaminophen (TYLENOL) 650 MG suppository Place 1 suppository (650 mg total) rectally every 6 (six) hours as needed for mild pain (or Fever >/= 101). 02/08/21   Nolberto Hanlon, MD  ondansetron (ZOFRAN) 4 MG tablet Take 1 tablet (4 mg total) by mouth every 6 (six) hours as needed for nausea. 02/08/21   Nolberto Hanlon, MD  rivastigmine (EXELON) 4.6 mg/24hr Place 1 patch (4.6 mg total) onto the skin daily. 02/09/21   Nolberto Hanlon, MD    Allergies Patient has no known allergies.  Family History  Problem Relation Age of Onset  . Heart disease Father   . Bladder Cancer Neg Hx   . Prostate cancer Neg Hx   . Kidney cancer Neg Hx     Social History Social History   Tobacco Use  . Smoking status: Never Smoker  . Smokeless tobacco: Never Used  Vaping Use  . Vaping Use: Never used  Substance Use Topics  . Alcohol use: Yes    Alcohol/week: 0.0 - 3.0 standard drinks  . Drug use: No    Review of Systems Unable to assess ____________________________________________   PHYSICAL EXAM:  VITAL SIGNS: ED Triage Vitals  Enc Vitals Group     BP 02/06/21 1210 104/78     Pulse Rate 02/06/21 1210 77     Resp 02/06/21 1210 16      Temp 02/06/21 1210 98.7 F (37.1 C)     Temp Source 02/06/21 1210 Oral     SpO2 02/06/21 1210 100 %     Weight 02/06/21 1208 149 lb 4 oz (67.7 kg)     Height 02/06/21 1208 5\' 6"  (1.676 m)     Head Circumference --      Peak Flow --      Pain Score --      Pain Loc --      Pain Edu? --      Excl. in [employer]? --    Constitutional: Alert and disoriented. Well appearing and in no acute distress. Eyes: Conjunctivae are normal. PERRL. Head: Atraumatic. Nose: No congestion/rhinnorhea. Mouth/Throat: Mucous membranes are moist. Neck: No stridor Cardiovascular: Grossly normal  heart sounds.  Good peripheral circulation. Respiratory: Normal respiratory effort.  No retractions. Gastrointestinal: Soft and nontender. No distention. Musculoskeletal: No obvious deformities Neurologic:  Slowed speech and garbled language. maes Skin:  Skin is warm and dry. No rash noted. Psychiatric: cooperative  ____________________________________________   LABS (all labs ordered are listed, but only abnormal results are displayed)  Labs Reviewed  COMPREHENSIVE METABOLIC PANEL - Abnormal; Notable for the following components:      Result Value   Calcium 8.4 (*)    Total Protein 6.4 (*)    All other components within normal limits  BRAIN NATRIURETIC PEPTIDE - Abnormal; Notable for the following components:   B Natriuretic Peptide 111.5 (*)    All other components within normal limits  CBC WITH DIFFERENTIAL/PLATELET - Abnormal; Notable for the following components:   RBC 3.83 (*)    Hemoglobin 12.1 (*)    HCT 36.1 (*)    All other components within normal limits  URINALYSIS, COMPLETE (UACMP) WITH MICROSCOPIC - Abnormal; Notable for the following components:   Color, Urine AMBER (*)    APPearance HAZY (*)    Ketones, ur 20 (*)    All other components within normal limits  SARS CORONAVIRUS 2 (TAT 6-24 HRS)  LIPASE, BLOOD  LACTIC ACID, PLASMA  ETHANOL  TROPONIN I (HIGH SENSITIVITY)    ____________________________________________  EKG  ED ECG REPORT I, Naaman Plummer, the attending physician, personally viewed and interpreted this ECG.  Date: 02/06/2021 EKG Time: 1209 Rate: 72 Rhythm: normal sinus rhythm QRS Axis: normal Intervals: normal ST/T Wave abnormalities: normal Narrative Interpretation: no evidence of acute ischemia  ____________________________________________  RADIOLOGY  ED MD interpretation: One-view portable chest x-ray shows no evidence of acute abnormalities including no pneumonia, pneumothorax, or widened mediastinum  Official radiology report(s): No results found.  ____________________________________________   PROCEDURES  Procedure(s) performed (including Critical Care):  Procedures   ____________________________________________   INITIAL IMPRESSION / ASSESSMENT AND PLAN / ED COURSE  As part of my medical decision making, I reviewed the following data within the [place] notes reviewed and incorporated, Labs reviewed, EKG interpreted, Old chart reviewed, Radiograph reviewed and Notes from prior ED visits reviewed and incorporated        Patient presents for worsening debilitation in the setting of end-stage Parkinson's disease.  Differential diagnosis includes but is not limited to: UTI, ACS, intracranial hemorrhage  Laboratory evaluation as well as radiologic evaluation shows no red flag symptomatology or cause for his worsening debilitation so far.  In discussion with family, they feel that patient would benefit from a PT and OT eval for possible resources      ____________________________________________   FINAL CLINICAL IMPRESSION(S) / ED DIAGNOSES  Final diagnoses:  Altered mental status, unspecified altered mental status type  Goals of care, counseling/discussion  DNR (do not resuscitate)     ED Discharge Orders         Ordered    rivastigmine (EXELON) 4.6 mg/24hr  Daily         02/08/21 1207    Increase activity slowly        02/08/21 1029    Diet - low sodium heart healthy        02/08/21 1029    acetaminophen (TYLENOL) 325 MG tablet  Every 6 hours PRN        02/08/21 1207    acetaminophen (TYLENOL) 650 MG suppository  Every 6 hours PRN        02/08/21 1207  ondansetron (ZOFRAN) 4 MG tablet  Every 6 hours PRN        02/08/21 1207           Note:  This document was prepared using Dragon voice recognition software and may include unintentional dictation errors.   Naaman Plummer, MD 02/08/21 1501

## 2021-02-06 NOTE — ED Notes (Signed)
Patient is resting comfortably. Call light in reach. Fall precautions in place.  

## 2021-02-07 DIAGNOSIS — R531 Weakness: Secondary | ICD-10-CM | POA: Diagnosis not present

## 2021-02-07 LAB — SARS CORONAVIRUS 2 (TAT 6-24 HRS): SARS Coronavirus 2: NEGATIVE

## 2021-02-07 MED ORDER — DONEPEZIL HCL 5 MG PO TABS
10.0000 mg | ORAL_TABLET | Freq: Every day | ORAL | Status: DC
  Filled 2021-02-07: qty 2

## 2021-02-07 MED ORDER — ACETAMINOPHEN 650 MG RE SUPP: 650.0000 mg | Freq: Four times a day (QID) | RECTAL | Status: DC | PRN

## 2021-02-07 MED ORDER — CARBIDOPA-LEVODOPA 25-250 MG PO TABS: 2.0000 | ORAL_TABLET | Freq: Four times a day (QID) | ORAL | Status: DC

## 2021-02-07 MED ORDER — FINASTERIDE 5 MG PO TABS
5.0000 mg | ORAL_TABLET | Freq: Every day | ORAL | Status: DC
  Filled 2021-02-07: qty 1

## 2021-02-07 MED ORDER — CARBIDOPA-LEVODOPA 25-250 MG PO TABS
2.0000 | ORAL_TABLET | Freq: Four times a day (QID) | ORAL | Status: DC
  Filled 2021-02-07 (×4): qty 2

## 2021-02-07 MED ORDER — LACTATED RINGERS IV BOLUS
1000.0000 mL | Freq: Once | INTRAVENOUS | Status: AC
  Administered 2021-02-07: 1000 mL via INTRAVENOUS

## 2021-02-07 MED ORDER — FLUTICASONE PROPIONATE 50 MCG/ACT NA SUSP
1.0000 | Freq: Every day | NASAL | Status: DC
  Filled 2021-02-07 (×2): qty 16

## 2021-02-07 MED ORDER — FLUTICASONE PROPIONATE 50 MCG/ACT NA SUSP: 1.0000 | Freq: Every day | NASAL | Status: DC

## 2021-02-07 MED ORDER — SODIUM CHLORIDE 0.9 % IV SOLN: INTRAVENOUS | Status: DC

## 2021-02-07 MED ORDER — ONDANSETRON HCL 4 MG PO TABS: 4.0000 mg | ORAL_TABLET | Freq: Four times a day (QID) | ORAL | Status: DC | PRN

## 2021-02-07 MED ORDER — RIVASTIGMINE 4.6 MG/24HR TD PT24
4.6000 mg | MEDICATED_PATCH | Freq: Every day | TRANSDERMAL | Status: DC
  Administered 2021-02-07 – 2021-02-08 (×2): 4.6 mg via TRANSDERMAL
  Filled 2021-02-07 (×3): qty 1

## 2021-02-07 MED ORDER — CARBIDOPA-LEVODOPA ER 50-200 MG PO TBCR: 1.0000 | EXTENDED_RELEASE_TABLET | Freq: Two times a day (BID) | ORAL | Status: DC

## 2021-02-07 MED ORDER — ADULT MULTIVITAMIN W/MINERALS CH
1.0000 | ORAL_TABLET | Freq: Every day | ORAL | Status: DC
  Filled 2021-02-07: qty 1

## 2021-02-07 MED ORDER — ECONAZOLE NITRATE 1 % EX CREA: 1.0000 "application " | TOPICAL_CREAM | Freq: Every day | CUTANEOUS | Status: DC

## 2021-02-07 MED ORDER — HALOPERIDOL LACTATE 5 MG/ML IJ SOLN: 2.0000 mg | Freq: Four times a day (QID) | INTRAMUSCULAR | Status: DC | PRN

## 2021-02-07 MED ORDER — ENTACAPONE 200 MG PO TABS
200.0000 mg | ORAL_TABLET | Freq: Three times a day (TID) | ORAL | Status: DC
  Filled 2021-02-07: qty 1

## 2021-02-07 MED ORDER — ONDANSETRON HCL 4 MG/2ML IJ SOLN: 4.0000 mg | Freq: Four times a day (QID) | INTRAMUSCULAR | Status: DC | PRN

## 2021-02-07 MED ORDER — PIMAVANSERIN TARTRATE 34 MG PO CAPS: 34.0000 mg | ORAL_CAPSULE | Freq: Every day | ORAL | Status: DC

## 2021-02-07 MED ORDER — VITAMIN D3 25 MCG (1000 UNIT) PO TABS
1000.0000 [IU] | ORAL_TABLET | Freq: Every day | ORAL | Status: DC
  Filled 2021-02-07 (×2): qty 1

## 2021-02-07 MED ORDER — CARBIDOPA-LEVODOPA ER 50-200 MG PO TBCR
1.0000 | EXTENDED_RELEASE_TABLET | Freq: Two times a day (BID) | ORAL | Status: DC
  Filled 2021-02-07 (×2): qty 1

## 2021-02-07 MED ORDER — ENOXAPARIN SODIUM 40 MG/0.4ML ~~LOC~~ SOLN
40.0000 mg | SUBCUTANEOUS | Status: DC
  Administered 2021-02-07: 40 mg via SUBCUTANEOUS
  Filled 2021-02-07: qty 0.4

## 2021-02-07 MED ORDER — DONEPEZIL HCL 10 MG PO TBDP: 10.0000 mg | ORAL_TABLET | Freq: Every day | ORAL | Status: DC

## 2021-02-07 MED ORDER — VITAMIN D3 25 MCG (1000 UNIT) PO TABS: 1000.0000 [IU] | ORAL_TABLET | Freq: Every day | ORAL | Status: DC

## 2021-02-07 MED ORDER — FINASTERIDE 5 MG PO TABS: 5.0000 mg | ORAL_TABLET | Freq: Every day | ORAL | Status: DC

## 2021-02-07 MED ORDER — ACETAMINOPHEN 325 MG PO TABS: 650.0000 mg | ORAL_TABLET | Freq: Four times a day (QID) | ORAL | Status: DC | PRN

## 2021-02-07 NOTE — ED Provider Notes (Signed)
-----------------------------------------   6:07 AM on 02/07/2021 -----------------------------------------   Blood pressure (!) 144/92, pulse 65, temperature 98.7 F (37.1 C), temperature source Oral, resp. rate 15, height 5\' 6"  (1.676 m), weight 67.7 kg, SpO2 99 %.  The patient is calm and cooperative at this time.  There have been no acute events since the last update.  Awaiting disposition plan from Social Work team.  Home medicines were ordered.   Paulette Blanch, MD 02/07/21 [phone]

## 2021-02-07 NOTE — TOC Initial Note (Signed)
Transition of Care [facility]) - Initial/Assessment Note    Patient Details  Name: Luke Durham MRN: [id number] Date of Birth: Feb 27, 1933  Transition of Care [facility]) CM/SW Contact:    Elliot Gurney [place], [place] Phone Number: 02/07/2021, 11:38 AM  Clinical Narrative:                 Patient is a 87 year old male who presents to the ED with worsening altered mnetal status over the last 3-4 days. Patient's daughter Magda Paganini [facility]) at bedside.Patient's son Lamberto Dinapoli also designated as [employer].   Patient resides with his wife and is  currently active with [facility], who provide in home care 7 days a week for 6 hours per day. Patient has a wheelchair, walker and safety rails around the toilet. Per patient's daughter, patient has been referred to [facility] for in home hospice and has already been contacted by Cath[facility] L[facility]. They are working on delivery for hospital bed tomorrow and the hospice nurse has been scheduled to complete her intake assessment on Tuesday morning. Collaboration phone call from [place] through [employer] confirmed the above plans. Patient to be admitted, TOC to continue to follow for discharge needs.   [place],  Transition of Care [phone]       Expected Discharge Plan: Home w Hospice Care Barriers to Discharge: Continued Medical Work up   Patient Goals and CMS Choice   Expected Discharge Plan and Services Expected Discharge Plan: Home w Hospice Care In-house Referral: Clinical Social Work   Post Acute Care Choice: Hospice (Patient's daughter has made contact with [facility]) Living arrangements for the past 2 months: Single Family Home                                      Prior Living Arrangements/Services Living arrangements for the past 2 months: Single Family Home Lives with:: Spouse Patient language and need for interpreter reviewed:: Yes Do you feel safe going back to the place where you live?: Yes      Need for Family  Participation in Patient Care: Yes (Comment) Care giver support system in place?: Yes (comment) Current home services: Homehealth aide,DME ([facility] comes 7 days per week 6 hours per day walker, wheelchair, shower chair, rails aroun toilet) Criminal Activity/Legal Involvement Pertinent to Current Situation/Hospitalization: No - Comment as needed  Activities of Daily Living      Permission Sought/Granted            Permission granted to share info w Relationship: daughter and son     Emotional Assessment Appearance:: Appears stated age   Affect (typically observed): Unable to Assess Orientation: : Fluctuating Orientation (Suspected and/or reported [place]) Alcohol / Substance Use: Not Applicable Psych Involvement: No (comment)  Admission diagnosis:  Generalized weakness [R53.1] Patient Active Problem List   Diagnosis Date Noted  . Generalized weakness 02/07/2021  . Altered mental status 03/30/2020  . Lab test positive for detection of COVID-19 virus 10/02/2019  . Hypotension 10/02/2019  . Dementia due to Parkinson's disease without behavioral disturbance ([place]) 07/18/2017  . Neurogenic orthostatic hypotension ([place]) 12/02/2016  . BPH with urinary obstruction 11/14/2016  . Urinary frequency 10/10/2016  . Microscopic hematuria 10/10/2016  . Parkinson's syndrome ([place]) 06/14/2016  . Arthritis 06/14/2016  . Depression 06/14/2016  . Heartburn 06/14/2016  . Urinary incontinence 06/14/2016  . Skin lesion 06/14/2016  . Bilateral edema of lower extremity 06/14/2016  PCP:  Mar Daring, PA-C Pharmacy:   [facility] [phone] [address], [place] [address] Phone: [phone] Fax: [phone]     Social Determinants of Health (SDOH) Interventions    Readmission Risk Interventions No flowsheet data found.

## 2021-02-07 NOTE — Evaluation (Addendum)
Physical Therapy Evaluation Patient Details Name: Luke Durham MRN: [id number] DOB: [birth year] Today's Date: 02/07/2021   History of Present Illness  Patient is an 87 yo male brought to ED for AMS, family reported pt fell last saturday, did have imaging for hitting his head. Pt with history of parkinson's disease, anxiety, GERD, CKD.    Clinical Impression  Pt sleeping upon PT entrance, pt's daughter in room. With cueing initially pt able to open eyes partially to see PT, squeeze PT hands, wiggle toes, and pull into semi sitting position with maxA but through remainder of session and especially with fatigue ability to follow commands decreased. Family reported up until about 6-7 days ago, pt was conversant, ambulatory with walker and supervision. For the last week pt has had a significant decline in cognitive status and functional mobility.   Supine to sit with maxA to EOB, poor sitting balance noted despite bilateral UE propping by PT (unable to provide LE support due to elevated gurney), posterior lean noted, pt with grimacing during mobility attempts. Extended time and cues given with assist to reposition to midline to maximize pt participation 1-2 person assist to maintain seated position. Returned to supine maxA. With handheld support pt able to pull on PT engage abdominal muscles to clear shoulders from the bed. 2+ assist for maxA bed mobility to change pt linens, gowns, brief. Pt able to reach and hold grab bars with maxA.  Overall the patient demonstrated deficits (see "PT Problem List") that impede the patient's functional abilities, safety, and mobility and would benefit from skilled PT intervention on a trial basis of 3-5 visits to assess the patient's ability to participate. Recommendation is SNF due to acute decline in functional status and to maximize pt mobility and safety. If family/pt decide on DC home, will need a hoyer lift, hospital bed and 24/7 supervision/assistance.         Follow Up Recommendations SNF    Equipment Recommendations  Hospital bed;Other (comment) (hoyer lift if returning home)    Recommendations for Other Services Speech consult     Precautions / Restrictions Precautions Precautions: Fall Restrictions Weight Bearing Restrictions: No      Mobility  Bed Mobility Overal bed mobility: Needs Assistance Bed Mobility: Rolling;Supine to Sit;Sit to Supine Rolling: Max assist;+2 for physical assistance   Supine to sit: Max assist;HOB elevated Sit to supine: Max assist;HOB elevated   General bed mobility comments: extended time and cues given to maximize patient participation, able to assist with lifting legs but challenged by sitting EOB, actively leaning posteriorly to return to supine throughout, bilateral UE propped to assist with balance though unsuccessful    Transfers                 General transfer comment: unable at this time  Ambulation/Gait             General Gait Details: unable at this time  Stairs            Wheelchair Mobility    Modified Rankin (Stroke Patients Only)       Balance Overall balance assessment: Needs assistance Sitting-balance support: Feet unsupported;Bilateral upper extremity supported Sitting balance-Leahy Scale: Zero         Standing balance comment: unable at this time                             Pertinent Vitals/Pain Pain Assessment: Faces Faces Pain Scale: Hurts little more Pain  Location: grimacing noted with mobility attempts, cold wash cloth to clean face Pain Descriptors / Indicators: Moaning;Grimacing;Guarding Pain Intervention(s): Limited activity within patient's tolerance;Monitored during session;Repositioned    Home Living Family/patient expects to be discharged to:: Private residence Living Arrangements: Spouse/significant other Available Help at Discharge: Family;Home health Type of Home: House Home Access: Level entry     Home  Layout: One level Home Equipment: Environmental consultant - 4 wheels;Shower seat Additional Comments: patient has caregivers multiple days per week that assist with ADLs per spouse report     Prior Function Level of Independence: Needs assistance   Gait / Transfers Assistance Needed: pt unable to provide PLOF; home set up gathered from previous admission. Per family for the last 6 days the patient has been unable to ambulate, minimal conversant. Prior to this, pt ambulated with RW household distances  ADL's / Homemaking Assistance Needed: total assist the last week or so        Hand Dominance        Extremity/Trunk Assessment   Upper Extremity Assessment Upper Extremity Assessment: Difficult to assess due to impaired cognition (able to squeeze PT hands on command, grab bed rails with assist, reach for face with RUE)    Lower Extremity Assessment Lower Extremity Assessment: Difficult to assess due to impaired cognition (pt able to perform heel slides AROM with tactile cues, wiggle toes on command)    Cervical / Trunk Assessment Cervical / Trunk Assessment: Kyphotic  Communication   Communication: Other (comment) (pt lethargic, but did intermittently verbalize; some garbled speech caused difficulty to fully understand)  Cognition Arousal/Alertness: Lethargic Behavior During Therapy: Flat affect Overall Cognitive Status: History of cognitive impairments - at baseline                                 General Comments: family reported pt was oriented prior to this AMS episode for the last week or so      General Comments      Exercises Other Exercises Other Exercises: PT assisted tech with bed mobility for bed linen, brief, and gown change. MaxAx2 for bed mobility, pt able to reach with assistance and then hold bed rail   Assessment/Plan    PT Assessment Patient needs continued PT services  PT Problem List Decreased strength;Decreased range of motion;Decreased activity  tolerance;Decreased balance;Decreased mobility;Decreased cognition       PT Treatment Interventions DME instruction;Balance training;Gait training;Neuromuscular re-education;Functional mobility training;Patient/family education;Therapeutic exercise;Therapeutic activities;Wheelchair mobility training    PT Goals (Current goals can be found in the Care Plan section)  Acute Rehab PT Goals Patient Stated Goal: to maintain pt safety and regain function as able PT Goal Formulation: With family Time For Goal Achievement: 02/21/21 Potential to Achieve Goals: Fair    Frequency Min 2X/week (trial PT 3-5 sessions to continue to assess pt's ability to participate)   Barriers to discharge        Co-evaluation               AM-PAC PT "6 Clicks" Mobility  Outcome Measure Help needed turning from your back to your side while in a flat bed without using bedrails?: Total Help needed moving from lying on your back to sitting on the side of a flat bed without using bedrails?: Total Help needed moving to and from a bed to a chair (including a wheelchair)?: Total Help needed standing up from a chair using your arms (e.g., wheelchair or  bedside chair)?: Total Help needed to walk in hospital room?: Total Help needed climbing 3-5 steps with a railing? : Total 6 Click Score: 6    End of Session   Activity Tolerance: Patient limited by lethargy Patient left: in bed;with call bell/phone within reach;with family/visitor present Nurse Communication: Mobility status PT Visit Diagnosis: Other abnormalities of gait and mobility (R26.89);Difficulty in walking, not elsewhere classified (R26.2);Muscle weakness (generalized) (M62.81)    Time: 7414-2395 PT Time Calculation (min) (ACUTE ONLY): 32 min   Charges:   PT Evaluation $PT Eval Moderate Complexity: 1 Mod PT Treatments $Therapeutic Activity: 23-37 mins        Lieutenant Diego PT, DPT 10:06 AM,02/07/21

## 2021-02-07 NOTE — ED Notes (Signed)
Pt resting with eyes closed, resp even and unlabored 

## 2021-02-07 NOTE — ED Notes (Signed)
Pt has been repositioned in bed.

## 2021-02-07 NOTE — H&P (Signed)
History and Physical    Luke Durham NFA:[id number] DOB: 08-23-1933 DOA: 02/06/2021  PCP: Mar Daring, PA-C   Patient coming from: Home  I have personally briefly reviewed patient's old medical records in [place]  Chief Complaint: Change in mental status Most of the history was obtained from patient's daughter who was at the bedside.  HPI: Luke Durham is a 87 y.o. male with medical history significant for end-stage Parkinson's disease with dementia who was brought into the ER by EMS for evaluation of mental status changes.  Per the daughter patient fell about a week ago and since then has had a progressive decline in his mental status. At baseline patient is usually ambulatory with a rolling walker and is verbal but over the last 5 to 6 days he has been nonverbal, unable to ambulate and has had poor oral intake. Hospice is supposed to do an initial evaluation on this patient on 02/09/21 with plans to initiate hospice care at home. I am unable to review of systems on him  due to his mental status changes. Labs show sodium 137, potassium 4.5, chloride 104, bicarb 25, glucose 84, BUN 22, creatinine 0.85, calcium 8.4, alkaline phosphatase 42, albumin 3.9, lipase 32, AST 38, ALT less than 5, total protein 6.4, BNP 111, troponin 7, lactic acid 0.9, white count 4.6, hemoglobin 12.1, hematocrit 36.1, MCV 94.3, RDW 13.6, platelet count 214 Chest x-ray reviewed by me shows no active disease. No evidence of pneumonia or pulmonary edema. CT scan of the head without contrast shows no evidence for acute intracranial abnormality. Atrophy. Chronic sinusitis.  ED Course: Patient is an 87 year old Caucasian male with a history of end-stage Parkinson's disease who was brought into the ER by EMS for evaluation of mental status changes. Patient sustained a fall about a week ago and since then has showed a progressive decline.  He is currently nonverbal, nonambulatory and has difficulty  swallowing.  Patient is supposed to be evaluated by hospice on 02/09/21. He will be placed in observation status for further evaluation.   Review of Systems: As per HPI otherwise all other systems reviewed and negative.    Past Medical History:  Diagnosis Date  . Anxiety   . Arthritis   . Asthma    childhood asthma  . Cancer ([place]) 7 years ago   lymphoma, [place] (recent)  . Chronic kidney disease   . Colon polyps   . GERD (gastroesophageal reflux disease)   . History of kidney stones   . Parkinson disease [facility])     Past Surgical History:  Procedure Laterality Date  . COLON SURGERY    . CYSTOSCOPY WITH LITHOLAPAXY N/A 11/14/2016   Procedure: CYSTOSCOPY WITH LITHOLAPAXY;  Surgeon: Hollice Espy, MD;  Location: [facility] ORS;  Service: Urology;  Laterality: N/A;  . EYE SURGERY Bilateral    Cataract Extraction with IOL  . HERNIA REPAIR  20 years  . MOHS SURGERY    . SMALL INTESTINE SURGERY     Per patient 7 years  . TRANSURETHRAL RESECTION OF PROSTATE N/A 11/14/2016   Procedure: TRANSURETHRAL RESECTION OF THE PROSTATE (TURP);  Surgeon: Hollice Espy, MD;  Location: [facility] ORS;  Service: Urology;  Laterality: N/A;     reports that he has never smoked. He has never used smokeless tobacco. He reports current alcohol use. He reports that he does not use drugs.  No Known Allergies  Family History  Problem Relation Age of Onset  . Heart disease Father   . Bladder  Cancer Neg Hx   . Prostate cancer Neg Hx   . Kidney cancer Neg Hx       Prior to Admission medications   Medication Sig Start Date End Date Taking? Authorizing Provider  carbidopa-levodopa (SINEMET CR) 50-200 MG tablet Take 1 tablet by mouth 2 (two) times daily.   Yes [provider]  carbidopa-levodopa (SINEMET IR) 25-250 MG tablet Take 2 tablets by mouth 4 (four) times daily.   Yes [provider]  cholecalciferol (VITAMIN D) 1000 units tablet Take 1,000 Units by mouth daily.   Yes [provider]  donepezil (ARICEPT ODT) 10 MG disintegrating tablet Take 10 mg by mouth at bedtime.    Yes [provider]  econazole nitrate 1 % cream Apply 1 application topically at bedtime. 07/21/20  Yes [provider]  entacapone (COMTAN) 200 MG tablet Take 200 mg by mouth 3 (three) times daily. 03/24/20  Yes [provider]  finasteride (PROSCAR) 5 MG tablet Take 1 tablet (5 mg total) by mouth daily. 04/10/20  Yes Fenton Malling M, PA-C  fluticasone (FLONASE) 50 MCG/ACT nasal spray Place 1 spray into both nostrils daily. 04/18/20  Yes Earleen Newport, MD  midodrine (PROAMATINE) 5 MG tablet Take 1 tablet (5 mg total) by mouth 3 (three) times daily with meals. 06/27/19  Yes Mar Daring, PA-C  Multiple Vitamin (MULTIVITAMIN) capsule Take 1 capsule by mouth daily.   Yes [provider]  Multiple Vitamins-Minerals (OCUVITE PO) Take 1 tablet by mouth daily.    Yes [provider]  Pimavanserin Tartrate 34 MG CAPS Take 34 mg by mouth daily.   Yes [provider]  rivastigmine (EXELON) 4.6 mg/24hr Place 4.6 mg onto the skin daily.   Yes [provider]  sertraline (ZOLOFT) 100 MG tablet Take 1 tablet (100 mg total) by mouth daily. 08/07/20  Yes Burnette, Jennifer M, PA-C  STOOL SOFTENER 100 MG capsule TAKE 1 CAPSULE(100 MG) BY MOUTH TWICE DAILY Patient taking differently: Take 100 mg by mouth 2 (two) times daily. 04/22/20  Yes Mar Daring, PA-C    Physical Exam: Vitals:   02/07/21 0400 02/07/21 0500 02/07/21 0600 02/07/21 0940  BP: (!) 146/114 (!) 144/92 (!) 142/90 140/89  Pulse: 82 65 69 68  Resp: 15 15 15 14   Temp:      TempSrc:      SpO2: 95% 99% 99% 97%  Weight:      Height:         Vitals:   02/07/21 0400 02/07/21 0500 02/07/21 0600 02/07/21 0940  BP: (!) 146/114 (!) 144/92 (!) 142/90 140/89  Pulse: 82 65 69 68  Resp: 15 15 15 14   Temp:      TempSrc:      SpO2: 95% 99% 99% 97%  Weight:      Height:           Constitutional:  Lethargic but opens eyes to loud verbal stimulus.  Oriented to person.  Not in any apparent distress HEENT:      Head: Normocephalic and atraumatic.         Eyes: PERLA, EOMI, Conjunctivae are normal. Sclera is non-icteric.       Mouth/Throat: Mucous membranes are moist.       Neck: Supple with no signs of meningismus. Cardiovascular: Regular rate and rhythm. No murmurs, gallops, or rubs. 2+ symmetrical distal pulses are present . No JVD. No LE edema Respiratory: Respiratory effort normal .Lungs sounds clear bilaterally. No  wheezes, crackles, or rhonchi.  Gastrointestinal: Soft, non tender, and non distended with positive bowel sounds.  Genitourinary: No CVA tenderness. Musculoskeletal: Nontender with normal range of motion in all extremities. No cyanosis, or erythema of extremities.  Tremors in his extremities Neurologic:  Face is symmetric. Moving all extremities.  Generalized weakness Skin: Skin is warm, dry.  No rash or ulcers Psychiatric: Mood and affect are normal   Labs on Admission: I have personally reviewed following labs and imaging studies  CBC: Recent Labs  Lab 02/06/21 1218  WBC 4.6  NEUTROABS 3.2  HGB 12.1*  HCT 36.1*  MCV 94.3  PLT 846   Basic Metabolic Panel: Recent Labs  Lab 02/06/21 1218  NA 137  K 4.5  CL 104  CO2 25  GLUCOSE 84  BUN 22  CREATININE 0.85  CALCIUM 8.4*   GFR: Estimated Creatinine Clearance: 57.3 mL/min (by C-G formula based on SCr of 0.85 mg/dL). Liver Function Tests: Recent Labs  Lab 02/06/21 1218  AST 38  ALT <5  ALKPHOS 42  BILITOT 1.1  PROT 6.4*  ALBUMIN 3.9   Recent Labs  Lab 02/06/21 1218  LIPASE 32   No results for input(s): AMMONIA in the last 168 hours. Coagulation Profile: No results for input(s): INR, PROTIME in the last 168 hours. Cardiac Enzymes: No results for input(s): CKTOTAL, CKMB, CKMBINDEX, TROPONINI in the last 168 hours. BNP (last 3 results) No results for input(s):  PROBNP in the last 8760 hours. HbA1C: No results for input(s): HGBA1C in the last 72 hours. CBG: No results for input(s): GLUCAP in the last 168 hours. Lipid Profile: No results for input(s): CHOL, HDL, LDLCALC, TRIG, CHOLHDL, LDLDIRECT in the last 72 hours. Thyroid Function Tests: No results for input(s): TSH, T4TOTAL, FREET4, T3FREE, THYROIDAB in the last 72 hours. Anemia Panel: No results for input(s): VITAMINB12, FOLATE, FERRITIN, TIBC, IRON, RETICCTPCT in the last 72 hours. Urine analysis:    Component Value Date/Time   COLORURINE AMBER (A) 02/06/2021 1218   APPEARANCEUR HAZY (A) 02/06/2021 1218   APPEARANCEUR Clear 10/02/2018 1039   LABSPEC 1.025 02/06/2021 1218   PHURINE 5.0 02/06/2021 [place] 02/06/2021 [place] 02/06/2021 [place] 02/06/2021 1218   BILIRUBINUR Negative 07/03/2020 1352   BILIRUBINUR Negative 10/02/2018 1039   KETONESUR 20 (A) 02/06/2021 1218   PROTEINUR NEGATIVE 02/06/2021 1218   UROBILINOGEN 0.2 07/03/2020 1352   NITRITE NEGATIVE 02/06/2021 1218   [place] 02/06/2021 1218    Radiological Exams on Admission: CT Head Wo Contrast  Result Date: 02/06/2021 CLINICAL DATA:  Mental status changes, unknown cause. Patient has been declining since Monday. Parkinson's disease. EXAM: CT HEAD WITHOUT CONTRAST TECHNIQUE: Contiguous axial images were obtained from the base of the skull through the vertex without intravenous contrast. COMPARISON:  04/18/2020 FINDINGS: Brain: There is moderate central and cortical atrophy, stable in appearance. There is no intra or extra-axial fluid collection or mass lesion. The basilar cisterns and ventricles have a normal appearance. There is no CT evidence for acute infarction or hemorrhage. Vascular: There is atherosclerotic calcification of the internal carotid arteries. No hyperdense vessels. Skull: Normal. Negative for fracture or focal lesion. Sinuses/Orbits: There is  chronic mucosal thickening of the paranasal sinuses. No evidence for sinus wall fracture or acute sinusitis. Other: None IMPRESSION: 1. No evidence for acute intracranial abnormality. 2. Atrophy. 3. Chronic sinusitis. Electronically Signed   By: Nolon Nations M.D.   On: 02/06/2021 13:18   DG  Chest Port 1 View  Result Date: 02/06/2021 CLINICAL DATA:  Shortness of breath EXAM: PORTABLE CHEST 1 VIEW COMPARISON:  Chest x-rays dated 03/30/2020 and 10/01/2019. FINDINGS: Heart size and mediastinal contours are stable. Lungs are clear. No pleural effusion or pneumothorax is seen. Degenerative changes of the RIGHT shoulder, incompletely imaged. No acute appearing osseous abnormality. IMPRESSION: No active disease. No evidence of pneumonia or pulmonary edema. Electronically Signed   By: Franki Cabot M.D.   On: 02/06/2021 13:03     Assessment/Plan Principal Problem:   Generalized weakness Active Problems:   Neurogenic orthostatic hypotension (HCC)   Dementia due to Parkinson's disease without behavioral disturbance (HCC)    Generalized weakness In a patient with Parkinson's disease with dementia At baseline patient is able to ambulate with a rolling walker but over the last couple of days has become bedbound and unable to ambulate Symptoms appear to be secondary to advanced Parkinson's disease Patient also has difficulty swallowing.   Hold all oral medications for now Patient is awaiting hospice evaluation    Neurogenic orthostatic hypotension Hold midodrine since blood pressure is elevated   Dementia due to Parkinson's disease without behavioral disturbance Continue Exelon patch  DVT prophylaxis: Lovenox Code Status: DNR Family Communication: Greater than 50% of time was spent discussing patient's condition and plan of care with his daughter at the bedside.  All questions and concerns have been addressed.  CODE STATUS was discussed and patient is a DNR Disposition Plan: Back to previous  home environment Consults called: Palliative care/hospice Status: Observation     Verneal Wiers MD [facility]     02/07/2021, 1:52 PM

## 2021-02-07 NOTE — ED Notes (Signed)
Pt transported to Room 212

## 2021-02-07 NOTE — ED Provider Notes (Signed)
Patient presents with above stage III exam for assessment of fairly advanced Parkinson's disease with similar acute decrease in mental status incontinence no bed ambulate over last couple days.  Seen initially yesterday by Dr. Cheri Fowler with unremarkable medical work-up.  Patient recently in ED border with plan for PT OT eval's.  However on my assessment daughter is at bedside and extensive discussion with daughter and son over the phone after nurse informed this provider that patient was not able to safely take tablets by mouth.  I reviewed patient's presentation unremarkable medical work-up and concern for progression of his Parkinson's disease.  I also reviewed his scope of care document at bedside which indicates he is a DNR and would not like a feeding tube but is able to get IV medications.  They note they are planning to take patient home with hospice which should be ready and in place in 1 to 2 days.  Patient was initially seen by PT recommended SNF given he is no longer awaiting SNF placement we will plan to admit to medicine for continued IV hydration pending safe disposition.   Lucrezia Starch, MD 02/07/21 [phone]

## 2021-02-07 NOTE — Evaluation (Signed)
Occupational Therapy Evaluation Patient Details Name: Luke Durham MRN: [id number] DOB: 1933/03/10 Today's Date: 02/07/2021    History of Present Illness Patient is an 87 yo male brought to ED for AMS, family reported pt fell last saturday, did have imaging for hitting his head. Pt with history of parkinson's disease, anxiety, GERD, CKD.   Clinical Impression   Pt seen for OT evaluation this date in setting of acute hospitalization d/t AMS. Pt has aide help at home for 4hrs/day and his spouse is able to help some as well. Pt is typically able to walk short bouts in the home with 4WW. Pt currently requiring MAX A for bed mobility and MOD A for bed level UB ADLs including grooming tasks as well as MOD tactile/verbal cues to initiate and sequence basic ADL tasks. Pt with eyes open ~40% of session requiring MAX stimuli to attend for short bouts. Will continue to follow acutely. Anticipate pt will require STR f/u to regain strength to more safely function in the home environment.     Follow Up Recommendations  SNF    Equipment Recommendations  Hospital bed    Recommendations for Other Services       Precautions / Restrictions Precautions Precautions: Fall Restrictions Weight Bearing Restrictions: No      Mobility Bed Mobility Overal bed mobility: Needs Assistance Bed Mobility: Rolling Rolling: Max assist         General bed mobility comments: increased time, cues to grab rail, good effort to use rail to pull once he could reach it.    Transfers                 General transfer comment: unable at this time    Balance                                           ADL either performed or assessed with clinical judgement   ADL                                         General ADL Comments: needs MOD A for UB self care bed level in high fowlers including oral care and washing face. Pt requires TOTAL A for LB ADLs at this time baseline  on clinical observation     Vision   Additional Comments: difficult to formally assess, pt with eyes open ~40% of session     Perception     Praxis      Pertinent Vitals/Pain Pain Assessment: Faces Faces Pain Scale: Hurts little more Pain Location: grimacing noted with mobility attempts, cold wash cloth to clean face Pain Descriptors / Indicators: Moaning;Grimacing;Guarding;Restless Pain Intervention(s): Limited activity within patient's tolerance;Monitored during session     Hand Dominance Right   Extremity/Trunk Assessment Upper Extremity Assessment Upper Extremity Assessment: Generalized weakness;Difficult to assess due to impaired cognition   Lower Extremity Assessment Lower Extremity Assessment: Generalized weakness;Difficult to assess due to impaired cognition       Communication Communication Communication: Other (comment) (garbled, but some discernable speech)   Cognition Arousal/Alertness: Lethargic Behavior During Therapy: Flat affect Overall Cognitive Status: History of cognitive impairments - at baseline  General Comments: family reported pt was oriented prior to this AMS episode for the last week or so   General Comments       Exercises Other Exercises Other Exercises: OT facilitates pt participation in g/h tasks with MOD A and ed with pt's spouse re: engaging pt in familiar ADLs that he finds meaningful   Shoulder Instructions      Home Living Family/patient expects to be discharged to:: Private residence Living Arrangements: Spouse/significant other Available Help at Discharge: Family;Home health Type of Home: House Home Access: Level entry     [place]: One level     Bathroom Shower/Tub: Occupational psychologist: [place]: Environmental consultant - 4 wheels;Shower seat   Additional Comments: patient has caregivers multiple days per week that assist with ADLs per spouse  report       Prior Functioning/Environment Level of Independence: Needs assistance  Gait / Transfers Assistance Needed: pt unable to provide PLOF; home set up gathered from previous admission. Per family for the last 6 days the patient has been unable to ambulate, minimal conversant. Prior to this, pt ambulated with RW household distances ADL's / Homemaking Assistance Needed: total assist the last week or so            OT Problem List: Decreased strength;Decreased range of motion;Decreased activity tolerance;Decreased coordination;Decreased cognition;Impaired balance (sitting and/or standing)      OT Treatment/Interventions: Therapeutic activities;Self-care/ADL training;DME and/or AE instruction;Balance training;Therapeutic exercise;Energy conservation;Patient/family education    OT Goals(Current goals can be found in the care plan section) Acute Rehab OT Goals Patient Stated Goal: to maintain pt safety and regain function as able OT Goal Formulation: With family Time For Goal Achievement: 02/21/21 Potential to Achieve Goals: Good ADL Goals Pt Will Perform Grooming: with supervision;sitting (supported sitting with all ADL items close with <10% verbal cues to initiate/sequence) Pt Will Perform Upper Body Dressing: with supervision;sitting (with <10% verbal cues to initiate/sequence) Additional ADL Goal #1: Pt will perform 2 simple, one step tasks. Additional ADL Goal #2: Pt will tolerate upright sitting EOB or in chair x4-5 mins to increase fxl sitting tolerance for completion of ADLs.  OT Frequency: Min 1X/week   Barriers to D/C:            Co-evaluation              AM-PAC OT "6 Clicks" Daily Activity     Outcome Measure Help from another person eating meals?: A Little Help from another person taking care of personal grooming?: A Lot Help from another person toileting, which includes using toliet, bedpan, or urinal?: A Lot Help from another person bathing (including  washing, rinsing, drying)?: A Lot Help from another person to put on and taking off regular upper body clothing?: A Lot Help from another person to put on and taking off regular lower body clothing?: Total 6 Click Score: 12   End of Session Nurse Communication: Mobility status;Other (comment) (need mitts, grabbing at IV)  Activity Tolerance: Patient tolerated treatment well Patient left: in bed;with call bell/phone within reach  OT Visit Diagnosis: Other abnormalities of gait and mobility (R26.89);Muscle weakness (generalized) (M62.81);History of falling (Z91.81);Other symptoms and signs involving the nervous system (R29.898)                Time: 1610-9604 OT Time Calculation (min): 34 min Charges:  OT General Charges $OT Visit: 1 Visit OT Evaluation $OT Eval Moderate Complexity: 1 Mod OT Treatments $Self  Care/Home Management : 8-22 mins $Therapeutic Activity: 8-22 mins  Gerrianne Scale, MS, OTR/L ascom [phone] 02/07/21, 3:25 PM

## 2021-02-07 NOTE — ED Notes (Signed)
Pt is unable to open his eyes and follow commands. Pt is extremely lethargic. RN will request a speech eval.

## 2021-02-07 NOTE — ED Notes (Signed)
Informed RN bed assigned 

## 2021-02-08 ENCOUNTER — Telehealth: Payer: Self-pay | Admitting: Physician Assistant

## 2021-02-08 DIAGNOSIS — R531 Weakness: Secondary | ICD-10-CM | POA: Diagnosis not present

## 2021-02-08 DIAGNOSIS — Z7189 Other specified counseling: Secondary | ICD-10-CM

## 2021-02-08 MED ORDER — ACETAMINOPHEN 325 MG PO TABS: 650.0000 mg | ORAL_TABLET | Freq: Four times a day (QID) | ORAL | Status: AC | PRN

## 2021-02-08 MED ORDER — RIVASTIGMINE 4.6 MG/24HR TD PT24: 4.6000 mg | MEDICATED_PATCH | Freq: Every day | TRANSDERMAL | 12 refills | Status: AC

## 2021-02-08 MED ORDER — ACETAMINOPHEN 650 MG RE SUPP: 650.0000 mg | Freq: Four times a day (QID) | RECTAL | 0 refills | Status: AC | PRN

## 2021-02-08 MED ORDER — ONDANSETRON HCL 4 MG PO TABS: 4.0000 mg | ORAL_TABLET | Freq: Four times a day (QID) | ORAL | 0 refills | Status: AC | PRN

## 2021-02-08 NOTE — Progress Notes (Addendum)
[facility]) Hospital Liaison RN note:  Received request from Dr. Kurtis Bushman for family interest in Luke Durham. Chart reviewed and eligibility was approved. Spoke with daughter, Magda Paganini over the phone to confirm interest and explain services. She verbalized understanding and all questions were answered. Magda Paganini will docusign paperwork today. Hospice Home would have a room to offer today with a 5 pm transport. Hospital care team is aware.  Please call with any hospice related questions or concerns.  Thank you for the opportunity to participate in this patient's care.  Zandra Abts, RN [facility] Liaison [phone]

## 2021-02-08 NOTE — Discharge Summary (Signed)
Luke Durham HGD:[id number] DOB: 1933-10-09 DOA: 02/06/2021  PCP: Mar Daring, PA-C  Admit date: 02/06/2021 Discharge date: 02/08/2021  Admitted From: home Disposition:  Hospice house  Recommendations for Outpatient Follow-up:none     Discharge Condition:Stable CODE STATUS:DNR  Diet recommendation:regular as tolerated   Brief/Interim Summary: Per HPI:   HPI: Luke Durham is a 87 y.o. male with medical history significant for end-stage Parkinson's disease with dementia who was brought into the ER by EMS for evaluation of mental status changes.  Per the daughter patient fell about a week ago and since then has had a progressive decline in his mental status. At baseline patient is usually ambulatory with a rolling walker and is verbal but over the last 5 to 6 days he has been nonverbal, unable to ambulate and has had poor oral intake. Hospice is supposed to do an initial evaluation on this patient on 02/09/21 with plans to initiate hospice care at home. Family this am has decided instead of hospice at home, to transfer patient to hospice house .  Generalized weakness In a patient with Parkinson's disease with dementia Symptoms appear to be secondary to advanced Parkinson's disease Patient also has difficulty swallowing.      Neurogenic orthostatic hypotension Holding  midodrine since blood pressure is elevated Now patient going to hospice house   Dementia due to Parkinson's disease without behavioral disturbance Continue Exelon patch     Discharge Diagnoses:  Principal Problem:   Generalized weakness Active Problems:   Neurogenic orthostatic hypotension (HCC)   Dementia due to Parkinson's disease without behavioral disturbance [facility])    Discharge Instructions  Discharge Instructions    Diet - low sodium heart healthy   Complete by: As directed    Increase activity slowly   Complete by: As directed      Allergies as of 02/08/2021   No Known  Allergies     Medication List    STOP taking these medications   carbidopa-levodopa 25-250 MG tablet Commonly known as: SINEMET IR   carbidopa-levodopa 50-200 MG tablet Commonly known as: SINEMET CR   cholecalciferol 25 MCG (1000 UNIT) tablet Commonly known as: VITAMIN D   donepezil 10 MG disintegrating tablet Commonly known as: ARICEPT ODT   econazole nitrate 1 % cream   entacapone 200 MG tablet Commonly known as: COMTAN   finasteride 5 MG tablet Commonly known as: PROSCAR   fluticasone 50 MCG/ACT nasal spray Commonly known as: Flonase   midodrine 5 MG tablet Commonly known as: PROAMATINE   multivitamin capsule   OCUVITE PO   Pimavanserin Tartrate 34 MG Caps   sertraline 100 MG tablet Commonly known as: ZOLOFT   Stool Softener 100 MG capsule Generic drug: docusate sodium     TAKE these medications   acetaminophen 325 MG tablet Commonly known as: TYLENOL Take 2 tablets (650 mg total) by mouth every 6 (six) hours as needed for mild pain (or Fever >/= 101).   acetaminophen 650 MG suppository Commonly known as: TYLENOL Place 1 suppository (650 mg total) rectally every 6 (six) hours as needed for mild pain (or Fever >/= 101).   ondansetron 4 MG tablet Commonly known as: ZOFRAN Take 1 tablet (4 mg total) by mouth every 6 (six) hours as needed for nausea.   rivastigmine 4.6 mg/24hr Commonly known as: EXELON Place 1 patch (4.6 mg total) onto the skin daily. Start taking on: February 09, 2021       No Known Allergies  Consultations:  Procedures/Studies: CT Head Wo Contrast  Result Date: 02/06/2021 CLINICAL DATA:  Mental status changes, unknown cause. Patient has been declining since Monday. Parkinson's disease. EXAM: CT HEAD WITHOUT CONTRAST TECHNIQUE: Contiguous axial images were obtained from the base of the skull through the vertex without intravenous contrast. COMPARISON:  04/18/2020 FINDINGS: Brain: There is moderate central and cortical  atrophy, stable in appearance. There is no intra or extra-axial fluid collection or mass lesion. The basilar cisterns and ventricles have a normal appearance. There is no CT evidence for acute infarction or hemorrhage. Vascular: There is atherosclerotic calcification of the internal carotid arteries. No hyperdense vessels. Skull: Normal. Negative for fracture or focal lesion. Sinuses/Orbits: There is chronic mucosal thickening of the paranasal sinuses. No evidence for sinus wall fracture or acute sinusitis. Other: None IMPRESSION: 1. No evidence for acute intracranial abnormality. 2. Atrophy. 3. Chronic sinusitis. Electronically Signed   By: Nolon Nations M.D.   On: 02/06/2021 13:18   DG Chest Port 1 View  Result Date: 02/06/2021 CLINICAL DATA:  Shortness of breath EXAM: PORTABLE CHEST 1 VIEW COMPARISON:  Chest x-rays dated 03/30/2020 and 10/01/2019. FINDINGS: Heart size and mediastinal contours are stable. Lungs are clear. No pleural effusion or pneumothorax is seen. Degenerative changes of the RIGHT shoulder, incompletely imaged. No acute appearing osseous abnormality. IMPRESSION: No active disease. No evidence of pneumonia or pulmonary edema. Electronically Signed   By: Franki Cabot M.D.   On: 02/06/2021 13:03       Subjective: Pt mumbling. No acute issues. In mittens. Son at bedside  Discharge Exam: Vitals:   02/08/21 0757 02/08/21 1124  BP: (!) 160/100 (!) 154/97  Pulse: 69 81  Resp: 18 20  Temp: (!) 97.4 F (36.3 C) 98.1 F (36.7 C)  SpO2: 97% 98%   Vitals:   02/08/21 0023 02/08/21 0348 02/08/21 0757 02/08/21 1124  BP: (!) 159/73 (!) 133/91 (!) 160/100 (!) 154/97  Pulse: 69 78 69 81  Resp: 18 16 18 20   Temp: 97.7 F (36.5 C) 97.8 F (36.6 C) (!) 97.4 F (36.3 C) 98.1 F (36.7 C)  TempSrc:   Oral Oral  SpO2: 100%  97% 98%  Weight:      Height:        General: Pt is awake, mumbling, not able to understand. Keeps eyes closed.not in acute distress Cardiovascular: RRR,  S1/S2 +, no rubs, no gallops Respiratory: CTA bilaterally, no wheezing, no rhonchi Abdominal: Soft, NT, ND, bowel sounds + Extremities: no edema   The results of significant diagnostics from this hospitalization (including imaging, microbiology, ancillary and laboratory) are listed below for reference.     Microbiology: Recent Results (from the past 240 hour(s))  SARS CORONAVIRUS 2 (TAT 6-24 HRS) Nasopharyngeal Nasopharyngeal Swab     Status: None   Collection Time: 02/07/21 11:25 AM   Specimen: Nasopharyngeal Swab  Result Value Ref Range Status   SARS Coronavirus 2 NEGATIVE NEGATIVE Final    Comment: (NOTE) SARS-CoV-2 target nucleic acids are NOT DETECTED.  The SARS-CoV-2 RNA is generally detectable in upper and lower respiratory specimens during the acute phase of infection. Negative results do not preclude SARS-CoV-2 infection, do not rule out co-infections with other pathogens, and should not be used as the sole basis for treatment or other patient management decisions. Negative results must be combined with clinical observations, patient history, and epidemiological information. The expected result is Negative.  Fact Sheet for Patients: SugarRoll.be  Fact Sheet for Healthcare Providers: https://www.woods-mathews.com/  This test is not  yet approved or cleared by the [place] and  has been authorized for detection and/or diagnosis of SARS-CoV-2 by FDA under an Emergency Use Authorization (EUA). This EUA will remain  in effect (meaning this test can be used) for the duration of the COVID-19 declaration under Se ction 564(b)(1) of the Act, 21 U.S.C. section 360bbb-3(b)(1), unless the authorization is terminated or revoked sooner.  Performed at [facility], [place] [address], [place], [place] [address]      Labs: BNP (last 3 results) Recent Labs    02/06/21 1231  BNP 604.5*   Basic Metabolic Panel: Recent Labs   Lab 02/06/21 1218  NA 137  K 4.5  CL 104  CO2 25  GLUCOSE 84  BUN 22  CREATININE 0.85  CALCIUM 8.4*   Liver Function Tests: Recent Labs  Lab 02/06/21 1218  AST 38  ALT <5  ALKPHOS 42  BILITOT 1.1  PROT 6.4*  ALBUMIN 3.9   Recent Labs  Lab 02/06/21 1218  LIPASE 32   No results for input(s): AMMONIA in the last 168 hours. CBC: Recent Labs  Lab 02/06/21 1218  WBC 4.6  NEUTROABS 3.2  HGB 12.1*  HCT 36.1*  MCV 94.3  PLT 214   Cardiac Enzymes: No results for input(s): CKTOTAL, CKMB, CKMBINDEX, TROPONINI in the last 168 hours. BNP: Invalid input(s): POCBNP CBG: No results for input(s): GLUCAP in the last 168 hours. D-Dimer No results for input(s): DDIMER in the last 72 hours. Hgb A1c No results for input(s): HGBA1C in the last 72 hours. Lipid Profile No results for input(s): CHOL, HDL, LDLCALC, TRIG, CHOLHDL, LDLDIRECT in the last 72 hours. Thyroid function studies No results for input(s): TSH, T4TOTAL, T3FREE, THYROIDAB in the last 72 hours.  Invalid input(s): FREET3 Anemia work up No results for input(s): VITAMINB12, FOLATE, FERRITIN, TIBC, IRON, RETICCTPCT in the last 72 hours. Urinalysis    Component Value Date/Time   COLORURINE AMBER (A) 02/06/2021 1218   APPEARANCEUR HAZY (A) 02/06/2021 1218   APPEARANCEUR Clear 10/02/2018 1039   LABSPEC 1.025 02/06/2021 1218   PHURINE 5.0 02/06/2021 [place] 02/06/2021 1218   HGBUR NEGATIVE 02/06/2021 1218   BILIRUBINUR NEGATIVE 02/06/2021 1218   BILIRUBINUR Negative 07/03/2020 1352   BILIRUBINUR Negative 10/02/2018 1039   KETONESUR 20 (A) 02/06/2021 1218   PROTEINUR NEGATIVE 02/06/2021 1218   UROBILINOGEN 0.2 07/03/2020 1352   NITRITE NEGATIVE 02/06/2021 [place] 02/06/2021 1218   Sepsis Labs Invalid input(s): PROCALCITONIN,  WBC,  LACTICIDVEN Microbiology Recent Results (from the past 240 hour(s))  SARS CORONAVIRUS 2 (TAT 6-24 HRS) Nasopharyngeal Nasopharyngeal  Swab     Status: None   Collection Time: 02/07/21 11:25 AM   Specimen: Nasopharyngeal Swab  Result Value Ref Range Status   SARS Coronavirus 2 NEGATIVE NEGATIVE Final    Comment: (NOTE) SARS-CoV-2 target nucleic acids are NOT DETECTED.  The SARS-CoV-2 RNA is generally detectable in upper and lower respiratory specimens during the acute phase of infection. Negative results do not preclude SARS-CoV-2 infection, do not rule out co-infections with other pathogens, and should not be used as the sole basis for treatment or other patient management decisions. Negative results must be combined with clinical observations, patient history, and epidemiological information. The expected result is Negative.  Fact Sheet for Patients: SugarRoll.be  Fact Sheet for Healthcare Providers: https://www.woods-mathews.com/  This test is not yet approved or cleared by the [place] FDA and  has been authorized for detection  and/or diagnosis of SARS-CoV-2 by FDA under an Emergency Use Authorization (EUA). This EUA will remain  in effect (meaning this test can be used) for the duration of the COVID-19 declaration under Se ction 564(b)(1) of the Act, 21 U.S.C. section 360bbb-3(b)(1), unless the authorization is terminated or revoked sooner.  Performed at [facility], [place] [address], [place], [place] [address]      Time coordinating discharge: Over 30 minutes  SIGNED:   Nolberto Hanlon, MD  [facility] 02/08/2021, 12:08 PM Pager   If 7PM-7AM, please contact night-coverage [email or web address] Password [email or web address]

## 2021-02-08 NOTE — Telephone Encounter (Signed)
Called to inform the office that the patient is being admitted to Hospice from the hospital tomorrow.  He was a former patient of Dr. Marlyn Corporal and the family would like to know if Dr. Caryn Section would take over his care.  Please advise and call to confirm at [phone]

## 2021-02-08 NOTE — TOC Transition Note (Signed)
Transition of Care [facility]) - CM/SW Discharge Note   Patient Details  Name: Luke Durham MRN: [id number] Date of Birth: 02/14/1933  [facility]) CM/SW Contact:  Candie Chroman, LCSW Phone Number: 02/08/2021, 12:07 PM   Clinical Narrative:  Patient has orders to discharge to [facility] today. [facility] set up for 5:00. No further concerns. CSW signing off.   Final next level of care: [place] Barriers to Discharge: Barriers Resolved   Patient Goals and CMS Choice        Discharge Placement              Patient chooses bed at: Other - please specify in the comment section below: [facility]) Patient to be transferred to facility by: [facility] Name of family member notified: Hospice liaision notified daughter Magda Paganini Patient and family notified of of transfer: 02/08/21  Discharge Plan and Services In-house Referral: Clinical Social Work   Post Acute Care Choice: Hospice (Patient's daughter has made contact with [facility])                               Social Determinants of Health (SDOH) Interventions     Readmission Risk Interventions No flowsheet data found.

## 2021-02-08 NOTE — Consult Note (Signed)
Consultation Note Date: 02/08/2021   Patient Name: Luke Durham  DOB: [birth year]  MRN: [id number]  Age / Sex: 87 y.o., male  PCP: Mar Daring, PA-C Referring Physician: Nolberto Hanlon, MD  Reason for Consultation: Establishing goals of care  HPI/Patient Profile: Luke Durham is a 87 y.o. male with medical history significant for end-stage Parkinson's disease with dementia who was brought into the ER by EMS for evaluation of mental status changes.  Per the daughter patient fell about a week ago and since then has had a progressive decline in his mental status.  Clinical Assessment and Goals of Care: Patient is resting in bed. He is oriented to self and perseverates on money and a check. His daughter and son are at bedside. He is fidgeting with his blankets, patting his leg, trying to pull his gown off, and pulling the covers over his head.  They state prior to a fall a week ago, he was conversive and used a walker. He had home health 6 hours per day. For the past week he has declined significantly. He was nonverbal and was not able to ambulate with poor PO intake.    We discussed his diagnoses, prognosis, GOC, EOL wishes disposition and options.  Created space and opportunity for patient  to explore thoughts and feelings regarding current medical information. They state he is a man of faith;   A detailed discussion was had today regarding advanced directives.  Concepts specific to code status, artifical feeding and hydration, IV antibiotics and rehospitalization were discussed.  The difference between an aggressive medical intervention path and a comfort care path was discussed.  Values and goals of care important to patient and family were attempted to be elicited.  Discussed limitations of medical interventions to prolong quality of life in some situations and discussed the concept of human  mortality.  Patient did state he wanted to do things his way and that he did not want to inconvenience his children. Children discussing home with hospice vs hospice facility. They state they have locked in a bed at the hospice facility but are still considering home with hospice.    Discussed prognosis of < 2 weeks. Discussed symptom management and physical care that would be needed at home. The family would like to continue with current plans for hospice facility placement. TOC made aware.     [facility] facility placement.   Prognosis:   < 2 weeks      Primary Diagnoses: Present on Admission: . Dementia due to Parkinson's disease without behavioral disturbance ([place]) . (Resolved) BPH (benign prostatic hyperplasia) . Neurogenic orthostatic hypotension ([place])   I have reviewed the medical record, interviewed the patient and family, and examined the patient. The following aspects are pertinent.  Past Medical History:  Diagnosis Date  . Anxiety   . Arthritis   . Asthma    childhood asthma  . Cancer ([place]) 7 years ago   lymphoma, [place] (recent)  . Chronic kidney disease   . Colon  polyps   . GERD (gastroesophageal reflux disease)   . History of kidney stones   . Parkinson disease [facility])    Social History   Socioeconomic History  . Marital status: Married    Spouse name: Not on file  . Number of children: 2  . Years of education: Not on file  . Highest education level: Bachelor's degree (e.g., BA, AB, BS)  Occupational History  . Occupation: retired  Tobacco Use  . Smoking status: Never Smoker  . Smokeless tobacco: Never Used  Vaping Use  . Vaping Use: Never used  Substance and Sexual Activity  . Alcohol use: Yes    Alcohol/week: 0.0 - 3.0 standard drinks  . Drug use: No  . Sexual activity: Not on file  Other Topics Concern  . Not on file  Social History Narrative  . Not on file   Social Determinants of Health   Financial Resource  Strain: Low Risk   . Difficulty of Paying Living Expenses: Not hard at all  Food Insecurity: No Food Insecurity  . Worried About Charity fundraiser in the Last Year: Never true  . Ran Out of Food in the Last Year: Never true  Transportation Needs: No Transportation Needs  . Lack of Transportation (Medical): No  . Lack of Transportation (Non-Medical): No  Physical Activity: Inactive  . Days of Exercise per Week: 0 days  . Minutes of Exercise per Session: 0 min  Stress: Stress Concern Present  . Feeling of Stress : To some extent  Social Connections: Moderately Isolated  . Frequency of Communication with Friends and Family: More than three times a week  . Frequency of Social Gatherings with Friends and Family: More than three times a week  . Attends Religious Services: Never  . Active Member of Clubs or Organizations: No  . Attends Archivist Meetings: Never  . Marital Status: Married   Family History  Problem Relation Age of Onset  . Heart disease Father   . Bladder Cancer Neg Hx   . Prostate cancer Neg Hx   . Kidney cancer Neg Hx    Scheduled Meds: . econazole nitrate  1 application Topical QHS  . enoxaparin (LOVENOX) injection  40 mg Subcutaneous Q24H  . fluticasone  1 spray Each Nare Daily  . multivitamin with minerals  1 tablet Oral Daily  . rivastigmine  4.6 mg Transdermal Daily   Continuous Infusions: . sodium chloride 50 mL/hr at 02/08/21 0324   PRN Meds:.acetaminophen **OR** acetaminophen, haloperidol lactate, ondansetron **OR** ondansetron (ZOFRAN) IV Medications Prior to Admission:  Prior to Admission medications   Medication Sig Start Date End Date Taking? Authorizing Provider  carbidopa-levodopa (SINEMET CR) 50-200 MG tablet Take 1 tablet by mouth 2 (two) times daily.   Yes [provider]  carbidopa-levodopa (SINEMET IR) 25-250 MG tablet Take 2 tablets by mouth 4 (four) times daily.   Yes [provider]  cholecalciferol (VITAMIN D)  1000 units tablet Take 1,000 Units by mouth daily.   Yes [provider]  donepezil (ARICEPT ODT) 10 MG disintegrating tablet Take 10 mg by mouth at bedtime.    Yes [provider]  econazole nitrate 1 % cream Apply 1 application topically at bedtime. 07/21/20  Yes [provider]  entacapone (COMTAN) 200 MG tablet Take 200 mg by mouth 3 (three) times daily. 03/24/20  Yes [provider]  finasteride (PROSCAR) 5 MG tablet Take 1 tablet (5 mg total) by mouth daily. 04/10/20  Yes  Fenton Malling M, PA-C  fluticasone (FLONASE) 50 MCG/ACT nasal spray Place 1 spray into both nostrils daily. 04/18/20  Yes Earleen Newport, MD  midodrine (PROAMATINE) 5 MG tablet Take 1 tablet (5 mg total) by mouth 3 (three) times daily with meals. 06/27/19  Yes Mar Daring, PA-C  Multiple Vitamin (MULTIVITAMIN) capsule Take 1 capsule by mouth daily.   Yes [provider]  Multiple Vitamins-Minerals (OCUVITE PO) Take 1 tablet by mouth daily.    Yes [provider]  Pimavanserin Tartrate 34 MG CAPS Take 34 mg by mouth daily.   Yes [provider]  rivastigmine (EXELON) 4.6 mg/24hr Place 4.6 mg onto the skin daily.   Yes [provider]  sertraline (ZOLOFT) 100 MG tablet Take 1 tablet (100 mg total) by mouth daily. 08/07/20  Yes Burnette, Jennifer M, PA-C  STOOL SOFTENER 100 MG capsule TAKE 1 CAPSULE(100 MG) BY MOUTH TWICE DAILY Patient taking differently: Take 100 mg by mouth 2 (two) times daily. 04/22/20  Yes Mar Daring, PA-C  acetaminophen (TYLENOL) 325 MG tablet Take 2 tablets (650 mg total) by mouth every 6 (six) hours as needed for mild pain (or Fever >/= 101). 02/08/21   Nolberto Hanlon, MD  acetaminophen (TYLENOL) 650 MG suppository Place 1 suppository (650 mg total) rectally every 6 (six) hours as needed for mild pain (or Fever >/= 101). 02/08/21   Nolberto Hanlon, MD  ondansetron (ZOFRAN) 4 MG tablet Take 1 tablet (4 mg total) by mouth  every 6 (six) hours as needed for nausea. 02/08/21   Nolberto Hanlon, MD  rivastigmine (EXELON) 4.6 mg/24hr Place 1 patch (4.6 mg total) onto the skin daily. 02/09/21   Nolberto Hanlon, MD   No Known Allergies Review of Systems  All other systems reviewed and are negative.   Physical Exam Pulmonary:     Effort: Pulmonary effort is normal.  Neurological:     Mental Status: He is alert.     Vital Signs: BP (!) 154/97 (BP Location: Right Arm)   Pulse 81   Temp 98.1 F (36.7 C) (Oral)   Resp 20   Ht 5\' 6"  (1.676 m)   Wt 67.7 kg   SpO2 98%   BMI 24.09 kg/m  Pain Scale: PAINAD       SpO2: SpO2: 98 % O2 Device:SpO2: 98 % O2 Flow Rate: .   IO: Intake/output summary:   Intake/Output Summary (Last 24 hours) at 02/08/2021 1317 Last data filed at 02/08/2021 0324 Gross per 24 hour  Intake 481.78 ml  Output --  Net 481.78 ml    LBM: Last BM Date: 02/07/21 Baseline Weight: Weight: 67.7 kg Most recent weight: Weight: 67.7 kg     Palliative Assessment/Data:     Time In: 12:50 Time Out: 1:20 Time Total: 30 min Greater than 50%  of this time was spent counseling and coordinating care related to the above assessment and plan.  Signed by: Asencion Gowda, NP   Please contact Palliative Medicine Team phone at [phone] for questions and concerns.  For individual provider: See Shea Evans

## 2021-02-08 NOTE — Progress Notes (Signed)
Clinical/Bedside Swallow Evaluation Patient Details  Name: Luke Durham MRN: [id number] Date of Birth: 04-Dec-1932  Today's Date: 02/08/2021 Time: SLP Start Time (ACUTE ONLY): 1110 SLP Stop Time (ACUTE ONLY): 1200 SLP Time Calculation (min) (ACUTE ONLY): 50 min  Past Medical History:  Past Medical History:  Diagnosis Date   Anxiety    Arthritis    Asthma    childhood asthma   Cancer ([place]) 7 years ago   lymphoma, [facility] (recent)   Chronic kidney disease    Colon polyps    GERD (gastroesophageal reflux disease)    History of kidney stones    Parkinson disease ([place])    Past Surgical History:  Past Surgical History:  Procedure Laterality Date   COLON SURGERY     CYSTOSCOPY WITH LITHOLAPAXY N/A 11/14/2016   Procedure: CYSTOSCOPY WITH LITHOLAPAXY;  Surgeon: Hollice Espy, MD;  Location: [facility] ORS;  Service: Urology;  Laterality: N/A;   EYE SURGERY Bilateral    Cataract Extraction with IOL   HERNIA REPAIR  20 years   MOHS SURGERY     SMALL INTESTINE SURGERY     Per patient 7 years   TRANSURETHRAL RESECTION OF PROSTATE N/A 11/14/2016   Procedure: TRANSURETHRAL RESECTION OF THE PROSTATE (TURP);  Surgeon: Hollice Espy, MD;  Location: [facility] ORS;  Service: Urology;  Laterality: N/A;   HPI:  Luke Durham is an 87 y.o. male with past medical history of end-stage Parkinson's disease, dementia, GERD, anxiety, CKD brought to ED for AMS. Pt fell about a week ago and has been unable to ambulate, had poor oral intake and been nonverbal. CT head negative for acute findings, noted for atrophy. CXR 02/06/21 showed no active disease, no evidence of pneumonia or pulmonary of edema.   Assessment / Plan / Recommendation Clinical Impression  Patient presents with overall mild-moderate aspiration risk given end-stage Parkinson's disease, cognitive impairment, deconditioning. He is alert and responding appropriately to simple questions; voice is extremely hypophonic but he is able to attain phonation  with cues for louder speech. After oral care, SLP assisted pt with self-feeding thin and nectar thick liquids, honey-thick magic cup, and graham cracker. Pt required mod-max tactile assist but was able to participate in feeding process by holding spoon and cup, and bringing items to mouth with assistance. Generalized oral weakness and decreased range of motion noted in oral motor examination intermittent clinical signs of aspiration with larger sips of both thin and nectar thick liquids. With SLP assistance for small sips/rate control, pt consumed all consistencies without overt signs of aspiration. Masticated solid cracker without difficulty and had good oral clearance with extended time. With larger sips and straw, poor awareness of bolus and appearance of discoordination impacted function, with wet vocal quality noted x2 and cough x2. Educated family on aspiration risks in context of advanced Parkinson's disease, as well as potential for silent aspiration. Demonstrated use of positioning, use of rate/bolus size control and encouraging pt participation to maximize sensory input to minimize aspiration risk. Patient is discharging to Hospice facility and family goal is for pt to be comfortable. Educated on overt signs of aspiration and reinforced using above strategies to minimize risk. Recommend dysphagia 3 (soft) diet and thin liquids, no straws, meds crushed (or one at a time, whole in puree if cannot be crushed) and aspiration precautions. Pt will require assistance for feeding, but should be encouraged to participate in self-feeding if able.     SLP Visit Diagnosis: Dysphagia, oropharyngeal phase (R13.12)    Aspiration Risk  Mild aspiration risk;Moderate aspiration risk    Diet Recommendation Dysphagia 3 (Mech soft);Thin liquid   Liquid Administration via: Cup Medication Administration: Crushed with puree (or 1 at a time in puree if cannot crush) Supervision: Full supervision/cueing for  compensatory strategies Compensations: Slow rate;Small sips/bites;Clear throat intermittently Postural Changes: Seated upright at 90 degrees;Remain upright for at least 30 minutes after po intake    Other  Recommendations Oral Care Recommendations: Oral care BID   Follow up Recommendations Other (comment) (hospice home)      Frequency and Duration min 2x/week  1 week       Prognosis Prognosis for Safe Diet Advancement: [place] Date of Onset: 02/06/21 HPI: Luke Durham is an 87 y.o. male with past medical history of end-stage Parkinson's disease, dementia, GERD, anxiety, CKD brought to ED for AMS. Pt fell about a week ago and has been unable to ambulate, had poor oral intake and been nonverbal. CT head negative for acute findings, noted for atrophy. CXR 02/06/21 showed no active disease, no evidence of pneumonia or pulmonary of edema. Type of Study: Bedside Swallow Evaluation Previous Swallow Assessment: none on file; MBS was ordered by Dr. Manuella Ghazi (neurologist) but never scheduled Diet Prior to this Study: NPO Temperature Spikes Noted: No Respiratory Status: Room air History of Recent Intubation: No Behavior/Cognition: Distractible;Requires cueing Oral Cavity Assessment: Dry;Dried secretions Oral Care Completed by SLP: Yes Oral Cavity - Dentition: Adequate natural dentition Vision: Functional for self-feeding Self-Feeding Abilities: Needs assist;Needs set up Patient Positioning: Upright in bed Baseline Vocal Quality: Hoarse;Breathy;Low vocal intensity Volitional Cough: Weak Volitional Swallow: Able to elicit    Oral/Motor/Sensory Function Overall Oral Motor/Sensory Function: Moderate impairment Facial ROM: Reduced right;Reduced left Facial Symmetry: Within Functional Limits Facial Strength: Reduced right;Reduced left Lingual ROM: Reduced right;Reduced left Lingual Symmetry: Within Functional Limits Lingual Strength: Reduced Velum: Within  Functional Limits Mandible: Impaired   Ice Chips Ice chips: Impaired Presentation: Spoon Oral Phase Functional Implications: Prolonged oral transit Pharyngeal Phase Impairments: Suspected delayed Swallow;Wet Vocal Quality;Cough - Immediate   Thin Liquid Thin Liquid: Impaired Presentation: Cup Oral Phase Impairments: Poor awareness of bolus;Reduced lingual movement/coordination Oral Phase Functional Implications: Prolonged oral transit;Other (comment) (appears disorganized/discoordinated with larger sips) Pharyngeal  Phase Impairments: Suspected delayed Swallow;Wet Vocal Quality    Nectar Thick Nectar Thick Liquid: Impaired Presentation: Cup;Straw Oral Phase Impairments: Poor awareness of bolus;Reduced lingual movement/coordination Pharyngeal Phase Impairments: Suspected delayed Swallow;Wet Vocal Quality (with consecutive or larger sips)   Honey Thick Honey Thick Liquid: Impaired Presentation: Spoon Oral Phase Impairments: Reduced lingual movement/coordination Oral Phase Functional Implications: Prolonged oral transit   Puree Puree: Not tested (pt declined)   Solid     Solid: Impaired Presentation:  (attempted to self-feed but unable without max assist) Oral Phase Impairments: Poor awareness of bolus;Reduced lingual movement/coordination Oral Phase Functional Implications: Prolonged oral transit     Deneise Lever, MS, CCC-SLP Speech-Language Pathologist  Aliene Altes 02/08/2021,2:02 PM

## 2021-02-09 ENCOUNTER — Telehealth: Payer: Self-pay

## 2021-02-09 DIAGNOSIS — N138 Other obstructive and reflux uropathy: Secondary | ICD-10-CM

## 2021-02-09 NOTE — Telephone Encounter (Signed)
[facility] faxed refill request for the following medications:  finasteride (PROSCAR) 5 MG tablet   I do not see on patients current medication list.  Please advise.

## 2021-02-11 NOTE — Telephone Encounter (Signed)
It looks like this was discontinued when he was discharged from the hospital last week. I think he's in hospice now so he probably doesn't need it.

## 2021-02-11 NOTE — Telephone Encounter (Signed)
Pharmacy has been advised.

## 2021-02-21 DEATH — deceased
